# Patient Record
Sex: Male | Born: 1940
Health system: Southern US, Community
[De-identification: ages and names within clinical notes are randomized; demographics above are authoritative.]

## PROBLEM LIST (undated history)

## (undated) DIAGNOSIS — G709 Myoneural disorder, unspecified: Secondary | ICD-10-CM

## (undated) DIAGNOSIS — M1990 Unspecified osteoarthritis, unspecified site: Secondary | ICD-10-CM

## (undated) DIAGNOSIS — Z9889 Other specified postprocedural states: Secondary | ICD-10-CM

## (undated) DIAGNOSIS — Z992 Dependence on renal dialysis: Secondary | ICD-10-CM

## (undated) DIAGNOSIS — I10 Essential (primary) hypertension: Secondary | ICD-10-CM

## (undated) DIAGNOSIS — N186 End stage renal disease: Secondary | ICD-10-CM

## (undated) DIAGNOSIS — I509 Heart failure, unspecified: Secondary | ICD-10-CM

## (undated) DIAGNOSIS — G510 Bell's palsy: Secondary | ICD-10-CM

## (undated) DIAGNOSIS — R112 Nausea with vomiting, unspecified: Secondary | ICD-10-CM

## (undated) DIAGNOSIS — E119 Type 2 diabetes mellitus without complications: Secondary | ICD-10-CM

## (undated) DIAGNOSIS — J189 Pneumonia, unspecified organism: Secondary | ICD-10-CM

## (undated) DIAGNOSIS — N189 Chronic kidney disease, unspecified: Secondary | ICD-10-CM

## (undated) DIAGNOSIS — E7800 Pure hypercholesterolemia, unspecified: Secondary | ICD-10-CM

## (undated) DIAGNOSIS — I2510 Atherosclerotic heart disease of native coronary artery without angina pectoris: Secondary | ICD-10-CM

## (undated) DIAGNOSIS — I219 Acute myocardial infarction, unspecified: Secondary | ICD-10-CM

## (undated) HISTORY — PX: KNEE SURGERY: SHX244

## (undated) HISTORY — PX: BACK SURGERY: SHX140

## (undated) HISTORY — PX: CARDIAC CATHETERIZATION: SHX172

## (undated) HISTORY — PX: TONSILLECTOMY: SUR1361

## (undated) HISTORY — PX: CORONARY ANGIOPLASTY: SHX604

## (undated) HISTORY — PX: ROTATOR CUFF REPAIR: SHX139

---

## 1988-07-03 DIAGNOSIS — I219 Acute myocardial infarction, unspecified: Secondary | ICD-10-CM

## 1988-07-03 HISTORY — DX: Acute myocardial infarction, unspecified: I21.9

## 1998-03-01 ENCOUNTER — Ambulatory Visit: Admission: RE | Admit: 1998-03-01 | Discharge: 1998-03-01 | Payer: Self-pay | Admitting: Otolaryngology

## 2004-03-17 ENCOUNTER — Observation Stay (HOSPITAL_COMMUNITY): Admission: RE | Admit: 2004-03-17 | Discharge: 2004-03-18 | Payer: Self-pay | Admitting: Orthopedic Surgery

## 2005-08-09 ENCOUNTER — Encounter: Admission: RE | Admit: 2005-08-09 | Discharge: 2005-08-09 | Payer: Self-pay | Admitting: Family Medicine

## 2007-02-06 ENCOUNTER — Ambulatory Visit (HOSPITAL_COMMUNITY): Admission: RE | Admit: 2007-02-06 | Discharge: 2007-02-06 | Payer: Self-pay | Admitting: Orthopedic Surgery

## 2008-09-11 ENCOUNTER — Encounter: Admission: RE | Admit: 2008-09-11 | Discharge: 2008-09-11 | Payer: Self-pay | Admitting: Family Medicine

## 2010-01-06 ENCOUNTER — Inpatient Hospital Stay (HOSPITAL_COMMUNITY): Admission: RE | Admit: 2010-01-06 | Discharge: 2010-01-09 | Payer: Self-pay | Admitting: Orthopedic Surgery

## 2010-09-18 LAB — COMPREHENSIVE METABOLIC PANEL
AST: 23 U/L (ref 0–37)
CO2: 23 mEq/L (ref 19–32)
GFR calc non Af Amer: 43 mL/min — ABNORMAL LOW (ref >60)
Glucose, Bld: 126 mg/dL — ABNORMAL HIGH (ref 70–99)
Potassium: 4.2 mEq/L (ref 3.5–5.1)
Total Bilirubin: 1.2 mg/dL (ref 0.3–1.2)

## 2010-09-18 LAB — GLUCOSE, CAPILLARY
Glucose-Capillary: 119 mg/dL — ABNORMAL HIGH (ref 70–99)
Glucose-Capillary: 127 mg/dL — ABNORMAL HIGH (ref 70–99)
Glucose-Capillary: 141 mg/dL — ABNORMAL HIGH (ref 70–99)
Glucose-Capillary: 143 mg/dL — ABNORMAL HIGH (ref 70–99)
Glucose-Capillary: 143 mg/dL — ABNORMAL HIGH (ref 70–99)
Glucose-Capillary: 149 mg/dL — ABNORMAL HIGH (ref 70–99)
Glucose-Capillary: 157 mg/dL — ABNORMAL HIGH (ref 70–99)

## 2010-09-18 LAB — DIFFERENTIAL
Basophils Relative: 1 % (ref 0–1)
Eosinophils Absolute: 0.3 10*3/uL (ref 0.0–0.7)
Lymphocytes Relative: 19 % (ref 12–46)
Lymphs Abs: 1.6 10*3/uL (ref 0.7–4.0)
Neutro Abs: 5.8 10*3/uL (ref 1.7–7.7)

## 2010-09-18 LAB — BASIC METABOLIC PANEL
BUN: 26 mg/dL — ABNORMAL HIGH (ref 6–23)
BUN: 26 mg/dL — ABNORMAL HIGH (ref 6–23)
Calcium: 8.8 mg/dL (ref 8.4–10.5)
Calcium: 8.9 mg/dL (ref 8.4–10.5)
Chloride: 103 mEq/L (ref 96–112)
Chloride: 98 mEq/L (ref 96–112)
Creatinine, Ser: 1.79 mg/dL — ABNORMAL HIGH (ref 0.4–1.5)
GFR calc Af Amer: 46 mL/min — ABNORMAL LOW (ref >60)
GFR calc Af Amer: 53 mL/min — ABNORMAL LOW (ref >60)
GFR calc non Af Amer: 44 mL/min — ABNORMAL LOW (ref >60)
GFR calc non Af Amer: 46 mL/min — ABNORMAL LOW (ref >60)
Potassium: 4.8 mEq/L (ref 3.5–5.1)
Potassium: 4.8 mEq/L (ref 3.5–5.1)
Sodium: 130 mEq/L — ABNORMAL LOW (ref 135–145)
Sodium: 135 mEq/L (ref 135–145)

## 2010-09-18 LAB — CBC
HCT: 36.9 % — ABNORMAL LOW (ref 39.0–52.0)
MCH: 33.1 pg (ref 26.0–34.0)
MCHC: 34.9 g/dL (ref 30.0–36.0)

## 2010-09-18 LAB — HEMOGLOBIN AND HEMATOCRIT, BLOOD
HCT: 30 % — ABNORMAL LOW (ref 39.0–52.0)
HCT: 30.5 % — ABNORMAL LOW (ref 39.0–52.0)
Hemoglobin: 10.6 g/dL — ABNORMAL LOW (ref 13.0–17.0)
Hemoglobin: 10.7 g/dL — ABNORMAL LOW (ref 13.0–17.0)

## 2010-09-18 LAB — APTT: aPTT: 29 seconds (ref 24–37)

## 2010-09-18 LAB — URINALYSIS, ROUTINE W REFLEX MICROSCOPIC
Hgb urine dipstick: NEGATIVE
Nitrite: NEGATIVE
Specific Gravity, Urine: 1.016 (ref 1.005–1.030)

## 2010-09-18 LAB — ABO/RH: ABO/RH(D): A NEG

## 2010-09-18 LAB — TYPE AND SCREEN: ABO/RH(D): A NEG

## 2010-09-18 LAB — SURGICAL PCR SCREEN: Staphylococcus aureus: POSITIVE — AB

## 2010-11-15 NOTE — Op Note (Signed)
NAME:  Robert Lucas, Robert Lucas NO.:  [id number]   MEDICAL RECORD NO.:  [id number]          PATIENT TYPE:  AMB   LOCATION:  DAY                          FACILITY:  [facility]   PHYSICIAN:  Kipp Brood. Gioffre, M.D.DATE OF BIRTH:  1940/07/13   DATE OF PROCEDURE:  02/06/2007  DATE OF DISCHARGE:                               OPERATIVE REPORT   SURGEON:  Kipp Brood. Gladstone Lighter, M.D.   ASSISTANT:  Nurse.   PREOPERATIVE DIAGNOSES:  1. Torn medial meniscus left knee.  2. Plica in the suprapatellar pouch, left knee.  3. Chondrocalcinosis left knee.   POSTOPERATIVE DIAGNOSES:  1. Torn medial meniscus left knee.  2. Plica in the suprapatellar pouch, left knee.  3. Chondrocalcinosis left knee.   OPERATION:  1. Diagnostic arthroscopy left knee.  2. Excision of a medial plica in suprapatellar pouch, left knee.  3. Medial meniscectomy left knee.   PROCEDURE:  Under general anesthesia, routine orthopedic prep and  draping the left lower extremity carried out.  He had IV Ancef 1 gram IV  preop.  At this time a small punctate incision made in suprapatellar  pouch, inflow cannula was inserted, knee which was distended with  saline.  Another small punctate incision made in the anterolateral joint  and complete diagnostic arthroscopy carried out.  The patient had a  large medial plica and that was verified on the MRI.  I simply utilized  the Arthrocare and excised that.  In examining the suprapatellar pouch,  he had obvious findings of chondrocalcinosis. I went down into the  medial joint space.  He had obvious severe findings chondrocalcinosis.  The articular cartilage was gray in color.  There were multiple calcific  stipples.  The cartilage was stippled with calcification.  He had a torn  posterior horn medial meniscus.  I did medial meniscectomy.  The ACL and  PCL was intact, I went over the lateral joint.  He had same exact  findings of chondral calcification with calcific stippling.   Thoroughly  irrigated out the knee, cleaned as much of the material out as I could.  It was floating loosely and closed all three punctate incisions with 3-0  nylon suture.  I injected 30 mL 0.5%Marcaine and epinephrine in the knee  joint and a sterile Neosporin bundle dressing was applied.  Follow-up  care be on Percocet  10/650 for pain one every four hours p.r.n.  He will be on aspirin 325  mg b.i.d. today and b.i.d. for 2 weeks. He will be on crutches partial  to full weightbearing as tolerated.  We will see him in the office in 10  to 12 days for suture removal.           ______________________________  Kipp Brood. Gladstone Lighter, M.D.     RAG/MEDQ  D:  02/06/2007  T:  02/06/2007  Job:  [id number]

## 2010-11-18 NOTE — Op Note (Signed)
NAME:  MARKLEY, MUJICA NO.:  [id number]   MEDICAL RECORD NO.:  [id number]                   PATIENT TYPE:  AMB   LOCATION:  DAY                                  FACILITY:  [facility]   PHYSICIAN:  Kipp Brood. Gladstone Lighter, M.D.             DATE OF BIRTH:  [birth year]   DATE OF PROCEDURE:  03/17/2004  DATE OF DISCHARGE:                                 OPERATIVE REPORT   SURGEON:  Kipp Brood. Gladstone Lighter, M.D.   ASSISTANT:  Elby Showers. Paitsel, P.A.   PREOPERATIVE DIAGNOSES:  1.  Complete retracted tear of the rotator cuff tendon, left.  2.  Severe impingement syndrome, left shoulder.   POSTOPERATIVE DIAGNOSES:  1.  Complete retracted tear of the rotator cuff tendon, left.  2.  Severe impingement syndrome, left shoulder.   OPERATION:  1.  Partial acromionectomy and acromioplasty, left.  2.  Repair of a rotator cuff tendon on the left.  He had a complete tear      where the tendon was initially torn longitudinally and separated from      its insertion.  We did a primary repair, then reinforced it with a      Tissue Mend 4 x 4 tendon graft.   PROCEDURE:  Under general anesthesia, the patient first had an interscalene  block prior to bringing him back to the operating room.  Once back in the  operating room, we gave him general anesthesia, then routine orthopedic prep  and drape of the left shoulder was carried out.  Bleeders were identified  and cauterized.  At this time, the incision was carried down to the deltoid  muscle proximally.  I exposed the acromion by sharp dissection and then  identified a complete tear of the rotator cuff.  It was split  longitudinally.  It retracted medially and laterally.  It was noted at this  time that the long head of the biceps was intact.  Following this, I then  inserted a Bennett retractor and utilized the oscillating saw.  I did a  partial acromionectomy and then utilized the bur to even out the  undersurface of the acromion.  Once  this was done, we irrigated out the  area.  I burred down the lateral aspect of the humerus and then did a  primary repair by bringing the medial and lateral segment to the rotator  cuff together with one Ethibond suture.  Following this, I then reinforced a  repair site with a 4 x 4 double-thickness Tissue Mend tendon graft.  This  was necessary because the tendon was not in the best condition, it was  somewhat degenerated.  We had a nice repair.  No multi-tack sutures were  used.  We thoroughly irrigated out the area.  I inserted a piece of Gelfoam  in the subacromial space, reapproximated the deltoid tendon and the muscle  __________ fascia.  Subcu was closed with  0 Vicryl.  The skin with metal  staples.  A sterile Neosporin dressing was applied.  He was then placed in  the shoulder immobilizer.  He had 1 g of IV Ancef preop.                                               Ronald A. Gladstone Lighter, M.D.    RAG/MEDQ  D:  03/17/2004  T:  03/17/2004  Job:  [id number]

## 2011-04-17 LAB — URINALYSIS, ROUTINE W REFLEX MICROSCOPIC
Bilirubin Urine: NEGATIVE
Ketones, ur: NEGATIVE
Nitrite: NEGATIVE
Protein, ur: NEGATIVE
Urobilinogen, UA: 0.2

## 2011-04-17 LAB — PROTIME-INR: INR: 1

## 2011-04-17 LAB — BASIC METABOLIC PANEL
BUN: 23
Calcium: 9.8
Creatinine, Ser: 1.22
GFR calc non Af Amer: 59 — ABNORMAL LOW
Glucose, Bld: 115 — ABNORMAL HIGH

## 2012-09-14 DIAGNOSIS — G510 Bell's palsy: Secondary | ICD-10-CM

## 2012-09-14 HISTORY — DX: Bell's palsy: G51.0

## 2012-10-12 ENCOUNTER — Emergency Department (HOSPITAL_COMMUNITY): Payer: Medicare Other

## 2012-10-12 ENCOUNTER — Encounter (HOSPITAL_COMMUNITY): Payer: Self-pay | Admitting: *Deleted

## 2012-10-12 ENCOUNTER — Observation Stay (HOSPITAL_COMMUNITY)
Admission: EM | Admit: 2012-10-12 | Discharge: 2012-10-14 | Disposition: A | Discharge status: Home / Self Care | Payer: Medicare Other | Attending: Internal Medicine | Admitting: Internal Medicine

## 2012-10-12 DIAGNOSIS — M109 Gout, unspecified: Secondary | ICD-10-CM | POA: Insufficient documentation

## 2012-10-12 DIAGNOSIS — G510 Bell's palsy: Secondary | ICD-10-CM | POA: Insufficient documentation

## 2012-10-12 DIAGNOSIS — R079 Chest pain, unspecified: Principal | ICD-10-CM | POA: Insufficient documentation

## 2012-10-12 DIAGNOSIS — E785 Hyperlipidemia, unspecified: Secondary | ICD-10-CM | POA: Insufficient documentation

## 2012-10-12 DIAGNOSIS — E119 Type 2 diabetes mellitus without complications: Secondary | ICD-10-CM | POA: Insufficient documentation

## 2012-10-12 DIAGNOSIS — E860 Dehydration: Secondary | ICD-10-CM | POA: Insufficient documentation

## 2012-10-12 DIAGNOSIS — Z9861 Coronary angioplasty status: Secondary | ICD-10-CM | POA: Insufficient documentation

## 2012-10-12 DIAGNOSIS — E1169 Type 2 diabetes mellitus with other specified complication: Secondary | ICD-10-CM | POA: Diagnosis present

## 2012-10-12 DIAGNOSIS — I10 Essential (primary) hypertension: Secondary | ICD-10-CM | POA: Insufficient documentation

## 2012-10-12 DIAGNOSIS — R531 Weakness: Secondary | ICD-10-CM | POA: Diagnosis present

## 2012-10-12 DIAGNOSIS — Z79899 Other long term (current) drug therapy: Secondary | ICD-10-CM | POA: Insufficient documentation

## 2012-10-12 DIAGNOSIS — R5381 Other malaise: Secondary | ICD-10-CM | POA: Insufficient documentation

## 2012-10-12 DIAGNOSIS — I2510 Atherosclerotic heart disease of native coronary artery without angina pectoris: Secondary | ICD-10-CM | POA: Insufficient documentation

## 2012-10-12 HISTORY — DX: Pure hypercholesterolemia, unspecified: E78.00

## 2012-10-12 HISTORY — DX: Essential (primary) hypertension: I10

## 2012-10-12 HISTORY — DX: Type 2 diabetes mellitus without complications: E11.9

## 2012-10-12 HISTORY — DX: Acute myocardial infarction, unspecified: I21.9

## 2012-10-12 HISTORY — DX: Bell's palsy: G51.0

## 2012-10-12 LAB — COMPREHENSIVE METABOLIC PANEL
ALT: 16 U/L (ref 0–53)
CO2: 26 mEq/L (ref 19–32)
Calcium: 9 mg/dL (ref 8.4–10.5)
GFR calc Af Amer: 62 mL/min — ABNORMAL LOW (ref >90)
GFR calc non Af Amer: 53 mL/min — ABNORMAL LOW (ref >90)
Glucose, Bld: 179 mg/dL — ABNORMAL HIGH (ref 70–99)
Sodium: 137 mEq/L (ref 135–145)

## 2012-10-12 LAB — CBC
HCT: 35.3 % — ABNORMAL LOW (ref 39.0–52.0)
Hemoglobin: 13 g/dL (ref 13.0–17.0)
MCH: 32.3 pg (ref 26.0–34.0)
MCV: 87.8 fL (ref 78.0–100.0)
RBC: 4.02 MIL/uL — ABNORMAL LOW (ref 4.22–5.81)

## 2012-10-12 LAB — URINALYSIS, ROUTINE W REFLEX MICROSCOPIC
Bilirubin Urine: NEGATIVE
Glucose, UA: 100 mg/dL — AB
Hgb urine dipstick: NEGATIVE
Ketones, ur: NEGATIVE mg/dL
Leukocytes, UA: NEGATIVE
Nitrite: NEGATIVE
Protein, ur: >300 mg/dL — AB
Specific Gravity, Urine: 1.022 (ref 1.005–1.030)
Urobilinogen, UA: 1 mg/dL (ref 0.0–1.0)
pH: 6 (ref 5.0–8.0)

## 2012-10-12 LAB — TROPONIN I: Troponin I: <0.3 ng/mL (ref <0.30)

## 2012-10-12 MED ORDER — ASPIRIN 81 MG PO CHEW
324.0000 mg | CHEWABLE_TABLET | Freq: Once | ORAL | Status: AC
  Administered 2012-10-12: 324 mg via ORAL
  Filled 2012-10-12: qty 3
  Filled 2012-10-12: qty 1

## 2012-10-12 NOTE — ED Provider Notes (Signed)
History    72 year old male with generalized fatigue. Onset yesterday. Patient is also having intermittent left-sided chest pain last night. Pressure in L chest. Did not radiate. Episodes lasted minutes. No SOB. No fever or chils. No appreciable exacerbating or relieving factors. Continues to feel very fatigued. Says felt similar when had MI in 1990. Cardiology Dr Wynonia Lawman. Reports last stress test about 2y ago and thinks it was fine. Reports compliance with medications.   CSN: [id number]  Arrival date & time 10/12/12  2113   First MD Initiated Contact with Patient 10/12/12 2135      Chief Complaint  Patient presents with  . Hypertension  . Fatigue    (Consider location/radiation/quality/duration/timing/severity/associated sxs/prior treatment) HPI  Past Medical History  Diagnosis Date  . Myocardial infarction   . Diabetes   . Hypertension   . Hypercholesteremia   . Bell's palsy 09/14/2012    Past Surgical History  Procedure Laterality Date  . Knee surgery    . Back surgery    . Rotator cuff repair      No family history on file.  History  Substance Use Topics  . Smoking status: Former Smoker    Types: Cigars  . Smokeless tobacco: Never Used  . Alcohol Use: No      Review of Systems  All systems reviewed and negative, other than as noted in HPI.   Allergies  Penicillins  Home Medications  No current outpatient prescriptions on file.  BP 177/76  Pulse 63  Temp(Src) 97.9 F (36.6 C) (Oral)  Resp 20  SpO2 98%  Physical Exam  Nursing note and vitals reviewed. Constitutional: He appears well-developed and well-nourished. No distress.  HENT:  Head: Normocephalic and atraumatic.  Eyes: Conjunctivae are normal. Right eye exhibits no discharge. Left eye exhibits no discharge.  Neck: Neck supple.  Cardiovascular: Normal rate, regular rhythm and normal heart sounds.  Exam reveals no gallop and no friction rub.   No murmur heard. Pulmonary/Chest: Effort  normal and breath sounds normal. No respiratory distress.  Abdominal: Soft. He exhibits no distension. There is no tenderness.  Musculoskeletal: He exhibits no edema and no tenderness.  Mild pitting LE edema. Legs symmetric as compared to each other. No calf tenderness.   Neurological: He is alert.  Skin: Skin is warm and dry.  Psychiatric: He has a normal mood and affect. His behavior is normal. Thought content normal.    ED Course  Procedures (including critical care time)  Labs Reviewed  CBC - Abnormal; Notable for the following:    RBC 4.02 (*)    HCT 35.3 (*)    MCHC 36.8 (*)    All other components within normal limits  COMPREHENSIVE METABOLIC PANEL - Abnormal; Notable for the following:    Glucose, Bld 179 (*)    BUN 31 (*)    Albumin 3.4 (*)    GFR calc non Af Amer 53 (*)    GFR calc Af Amer 62 (*)    All other components within normal limits  URINALYSIS, ROUTINE W REFLEX MICROSCOPIC - Abnormal; Notable for the following:    Glucose, UA 100 (*)    Protein, ur >300 (*)    All other components within normal limits  TROPONIN I  URINE MICROSCOPIC-ADD ON   Dg Chest 2 View  10/12/2012  *RADIOLOGY REPORT*  Clinical Data: 72 year old male with high blood pressure.  Cough.  CHEST - 2 VIEW  Comparison: 01/04/2010 and earlier.  Findings: Mildly lower lung volumes.  Cardiac  size and mediastinal contours are within normal limits.  Visualized tracheal air column is within normal limits.  No pneumothorax, pulmonary edema, pleural effusion or confluent pulmonary opacity. No acute osseous abnormality identified.  IMPRESSION: No acute cardiopulmonary abnormality.   Original Report Authenticated By: Roselyn Reef, M.D.    EKG:  Rhythm: normal sinus Vent. rate 61 BPM PR interval 136 ms QRS duration 78 ms QT/QTc 436/439 ms ST segments: NS ST changes   1. Chest pain   2. Diabetes mellitus   3. Hyperlipidemia   4. Hypertension       MDM          Virgel Manifold, MD 10/14/12  [id number]

## 2012-10-12 NOTE — ED Notes (Signed)
Pt states last night he had some left chest pain and today has felt listless and fatigued.  Pt denies N/V.  Pt c/o indigestion.  Pt states he checked his BP at home because he was tired.  It was 151/88 and 157/88 and 178/85.  These pressures were taken @ 20:30 today.  Pt had an MI in 1990 and stated at that time he had chest pain radiating to his jaw with indigestion.

## 2012-10-12 NOTE — ED Notes (Signed)
Patient transported to CT 

## 2012-10-13 ENCOUNTER — Encounter (HOSPITAL_COMMUNITY): Payer: Self-pay | Admitting: Internal Medicine

## 2012-10-13 ENCOUNTER — Observation Stay (HOSPITAL_COMMUNITY): Payer: Medicare Other

## 2012-10-13 DIAGNOSIS — I10 Essential (primary) hypertension: Secondary | ICD-10-CM | POA: Diagnosis present

## 2012-10-13 DIAGNOSIS — M109 Gout, unspecified: Secondary | ICD-10-CM | POA: Diagnosis present

## 2012-10-13 DIAGNOSIS — R531 Weakness: Secondary | ICD-10-CM | POA: Diagnosis present

## 2012-10-13 DIAGNOSIS — E1169 Type 2 diabetes mellitus with other specified complication: Secondary | ICD-10-CM | POA: Diagnosis present

## 2012-10-13 DIAGNOSIS — E119 Type 2 diabetes mellitus without complications: Secondary | ICD-10-CM | POA: Diagnosis present

## 2012-10-13 DIAGNOSIS — R079 Chest pain, unspecified: Secondary | ICD-10-CM | POA: Diagnosis present

## 2012-10-13 DIAGNOSIS — E785 Hyperlipidemia, unspecified: Secondary | ICD-10-CM | POA: Diagnosis present

## 2012-10-13 DIAGNOSIS — G510 Bell's palsy: Secondary | ICD-10-CM | POA: Diagnosis present

## 2012-10-13 LAB — BASIC METABOLIC PANEL
CO2: 24 mEq/L (ref 19–32)
Chloride: 102 mEq/L (ref 96–112)
GFR calc non Af Amer: 59 mL/min — ABNORMAL LOW (ref >90)
Glucose, Bld: 132 mg/dL — ABNORMAL HIGH (ref 70–99)
Potassium: 3.8 mEq/L (ref 3.5–5.1)
Sodium: 136 mEq/L (ref 135–145)

## 2012-10-13 LAB — GLUCOSE, CAPILLARY
Glucose-Capillary: 134 mg/dL — ABNORMAL HIGH (ref 70–99)
Glucose-Capillary: 140 mg/dL — ABNORMAL HIGH (ref 70–99)

## 2012-10-13 LAB — CBC
Hemoglobin: 13.2 g/dL (ref 13.0–17.0)
MCH: 32.8 pg (ref 26.0–34.0)
RBC: 4.03 MIL/uL — ABNORMAL LOW (ref 4.22–5.81)

## 2012-10-13 LAB — HEMOGLOBIN A1C: Hgb A1c MFr Bld: 6.8 % — ABNORMAL HIGH (ref <5.7)

## 2012-10-13 LAB — URINE MICROSCOPIC-ADD ON: Urine-Other: NONE SEEN

## 2012-10-13 MED ORDER — SODIUM CHLORIDE 0.9 % IJ SOLN
3.0000 mL | Freq: Two times a day (BID) | INTRAMUSCULAR | Status: DC
  Administered 2012-10-13 (×2): 3 mL via INTRAVENOUS

## 2012-10-13 MED ORDER — FINASTERIDE 5 MG PO TABS
5.0000 mg | ORAL_TABLET | Freq: Every day | ORAL | Status: DC
  Administered 2012-10-13 – 2012-10-14 (×2): 5 mg via ORAL
  Filled 2012-10-13 (×2): qty 1

## 2012-10-13 MED ORDER — ACETAMINOPHEN 650 MG RE SUPP: 650.0000 mg | Freq: Four times a day (QID) | RECTAL | Status: DC | PRN

## 2012-10-13 MED ORDER — GLIPIZIDE 5 MG PO TABS
5.0000 mg | ORAL_TABLET | Freq: Every day | ORAL | Status: DC
Start: 2012-10-13 — End: 2012-10-14
  Administered 2012-10-13 – 2012-10-14 (×2): 5 mg via ORAL
  Filled 2012-10-13 (×3): qty 1

## 2012-10-13 MED ORDER — ENOXAPARIN SODIUM 40 MG/0.4ML ~~LOC~~ SOLN
40.0000 mg | SUBCUTANEOUS | Status: DC
  Administered 2012-10-13: 40 mg via SUBCUTANEOUS
  Filled 2012-10-13 (×2): qty 0.4

## 2012-10-13 MED ORDER — TECHNETIUM TO 99M ALBUMIN AGGREGATED
6.0000 | Freq: Once | INTRAVENOUS | Status: AC | PRN
  Administered 2012-10-13: 6 via INTRAVENOUS

## 2012-10-13 MED ORDER — ATORVASTATIN CALCIUM 40 MG PO TABS
40.0000 mg | ORAL_TABLET | Freq: Every day | ORAL | Status: DC
  Administered 2012-10-13: 40 mg via ORAL
  Filled 2012-10-13 (×2): qty 1

## 2012-10-13 MED ORDER — VITAMIN D (ERGOCALCIFEROL) 1.25 MG (50000 UNIT) PO CAPS: 50000.0000 [IU] | ORAL_CAPSULE | ORAL | Status: DC

## 2012-10-13 MED ORDER — NIACIN ER (ANTIHYPERLIPIDEMIC) 500 MG PO TBCR
1000.0000 mg | EXTENDED_RELEASE_TABLET | Freq: Every evening | ORAL | Status: DC
  Administered 2012-10-13: 1000 mg via ORAL
  Filled 2012-10-13 (×3): qty 2

## 2012-10-13 MED ORDER — ACETAMINOPHEN 325 MG PO TABS: 650.0000 mg | ORAL_TABLET | Freq: Four times a day (QID) | ORAL | Status: DC | PRN

## 2012-10-13 MED ORDER — DILTIAZEM HCL ER 240 MG PO CP24
240.0000 mg | ORAL_CAPSULE | Freq: Every day | ORAL | Status: DC
  Administered 2012-10-13 – 2012-10-14 (×2): 240 mg via ORAL
  Filled 2012-10-13 (×2): qty 1

## 2012-10-13 MED ORDER — LOSARTAN POTASSIUM 50 MG PO TABS
100.0000 mg | ORAL_TABLET | Freq: Every day | ORAL | Status: DC
  Administered 2012-10-13 – 2012-10-14 (×2): 100 mg via ORAL
  Filled 2012-10-13 (×2): qty 2

## 2012-10-13 MED ORDER — ASPIRIN EC 325 MG PO TBEC
325.0000 mg | DELAYED_RELEASE_TABLET | Freq: Every day | ORAL | Status: DC
  Administered 2012-10-13 – 2012-10-14 (×2): 325 mg via ORAL
  Filled 2012-10-13 (×2): qty 1

## 2012-10-13 MED ORDER — NITROGLYCERIN 0.4 MG SL SUBL: 0.4000 mg | SUBLINGUAL_TABLET | SUBLINGUAL | Status: DC | PRN

## 2012-10-13 MED ORDER — INSULIN ASPART 100 UNIT/ML ~~LOC~~ SOLN
0.0000 [IU] | Freq: Three times a day (TID) | SUBCUTANEOUS | Status: DC
  Administered 2012-10-13: 2 [IU] via SUBCUTANEOUS
  Administered 2012-10-13: 1 [IU] via SUBCUTANEOUS

## 2012-10-13 MED ORDER — TERAZOSIN HCL 5 MG PO CAPS
5.0000 mg | ORAL_CAPSULE | Freq: Every day | ORAL | Status: DC
  Administered 2012-10-13 (×2): 5 mg via ORAL
  Filled 2012-10-13 (×3): qty 1

## 2012-10-13 MED ORDER — SODIUM CHLORIDE 0.9 % IV SOLN
INTRAVENOUS | Status: AC
  Administered 2012-10-13: 02:00:00 via INTRAVENOUS

## 2012-10-13 MED ORDER — TECHNETIUM TC 99M DIETHYLENETRIAME-PENTAACETIC ACID: 40.0000 | Freq: Once | INTRAVENOUS | Status: AC | PRN

## 2012-10-13 MED ORDER — ONDANSETRON HCL 4 MG/2ML IJ SOLN: 4.0000 mg | Freq: Four times a day (QID) | INTRAMUSCULAR | Status: DC | PRN

## 2012-10-13 MED ORDER — ALLOPURINOL 100 MG PO TABS
100.0000 mg | ORAL_TABLET | Freq: Two times a day (BID) | ORAL | Status: DC
  Administered 2012-10-13 – 2012-10-14 (×3): 100 mg via ORAL
  Filled 2012-10-13 (×4): qty 1

## 2012-10-13 MED ORDER — ONDANSETRON HCL 4 MG PO TABS: 4.0000 mg | ORAL_TABLET | Freq: Four times a day (QID) | ORAL | Status: DC | PRN

## 2012-10-13 NOTE — Progress Notes (Signed)
[facility] PROGRESS NOTE  ADOM CABADAS [id number] DOB: 11/16/1940 DOA: 10/12/2012 PCP: No primary provider on file.  Brief Narrative: 72 y.o. male who has had a recent right-sided Bell's palsy 6 weeks ago with history of CAD status post angioplasty in 1990, diabetes mellitus type 2 presented to the ER because of weakness and elevated blood pressure. Patient states that last night he was experiencing multiple episodes of chest pain which was on the left side stabbing and sometimes pressure-like lasting for 5-10 minutes waking him up from sleep. Denies any associated shortness of breath nausea vomiting diaphoresis palpitations or dizziness. Yesterday since morning he has been experiencing weakness which increased after he mowed. He checked his blood pressure later and was found to be high with systolic around [id number] and at this point decided to come to the ER. In the ER EKG chest x-ray cardiac markers were negative and he was chest pain-free at this time has been admitted for further management. Patient denies any nausea vomiting focal deficits blurred vision difficulty swallowing or speaking. 6 weeks ago he was treated with prednisone and antiviral for his Bell's palsy.  Assessment/Plan: Chest pain with history of CAD - cycle cardiac markers, first 2 troponins are negative  - d-dimer was positive, patient to undergo a VQ scan of DVT ultrasound today. Doesn't have much risk factors for getting a PE. - If workup for PE is negative, it is not unreasonable to call cardiology for stress test as he has known heart disease.   Weakness - probably from dehydration. Gently hydrate. - TSH pending.   Hypertension - continue home medications. When necessary IV hydralazine for systolic blood pressure more than 160. Recent Bell's palsy.  Diabetes mellitus type 2 - continue home medications with sliding-scale coverage.  Hyperlipidemia - continue home medications.  Code Status: Full Family  Communication: Wife at bedside  Disposition Plan: Home once the workup is finished  Consultants:  None  Procedures:  None  Antibiotics:  None  HPI/Subjective: Feels well this morning, denies any chest pain or breathing difficulties.  Objective: Filed Vitals:   10/12/12 2323 10/13/12 0045 10/13/12 0120 10/13/12 0548  BP: 160/74 154/78 157/73 158/80  Pulse: 60 57 60 68  Temp:  98.4 F (36.9 C) 98.2 F (36.8 C) 97.8 F (36.6 C)  TempSrc:  Oral Oral Oral  Resp: 21 19 18 16   Height:   5\' 4"  (1.626 m)   Weight:   82.6 kg (182 lb 1.6 oz)   SpO2: 97% 97% 99% 98%    Intake/Output Summary (Last 24 hours) at 10/13/12 1124 Last data filed at 10/13/12 0758  Gross per 24 hour  Intake 306.25 ml  Output   1150 ml  Net -843.75 ml   Filed Weights   10/13/12 0120  Weight: 82.6 kg (182 lb 1.6 oz)    Exam:   General:  NAD  Cardiovascular: regular rate and rhythm, without MRG  Respiratory: good air movement, clear to auscultation throughout, no wheezing, ronchi or rales  Abdomen: soft, not tender to palpation, positive bowel sounds  MSK: no peripheral edema  Neuro: CN 2-12 grossly intact, MS 5/5 in all 4  Data Reviewed: Basic Metabolic Panel:  Recent Labs Lab 10/12/12 2215 10/13/12 0155  NA 137 136  K 3.7 3.8  CL 102 102  CO2 26 24  GLUCOSE 179* 132*  BUN 31* 31*  CREATININE 1.29 1.18  CALCIUM 9.0 8.9   Liver Function Tests:  Recent Labs Lab 10/12/12 2215  AST  22  ALT 16  ALKPHOS 62  BILITOT 0.7  PROT 6.5  ALBUMIN 3.4*   CBC:  Recent Labs Lab 10/12/12 2215 10/13/12 0155  WBC 6.4 6.7  HGB 13.0 13.2  HCT 35.3* 35.5*  MCV 87.8 88.1  PLT 160 156   Cardiac Enzymes:  Recent Labs Lab 10/12/12 2215 10/13/12 0156 10/13/12 0756  TROPONINI <0.30 <0.30 <0.30   CBG:  Recent Labs Lab 10/13/12 0118 10/13/12 0808  GLUCAP 134* 140*    Studies: Dg Chest 2 View  10/12/2012  *RADIOLOGY REPORT*  Clinical Data: 72 year old male with high  blood pressure.  Cough.  CHEST - 2 VIEW  Comparison: 01/04/2010 and earlier.  Findings: Mildly lower lung volumes.  Cardiac size and mediastinal contours are within normal limits.  Visualized tracheal air column is within normal limits.  No pneumothorax, pulmonary edema, pleural effusion or confluent pulmonary opacity. No acute osseous abnormality identified.  IMPRESSION: No acute cardiopulmonary abnormality.   Original Report Authenticated By: Roselyn Reef, M.D.    Nm Pulmonary Perf And Vent  10/13/2012  *RADIOLOGY REPORT*  Clinical Data:  Chest pain.  Elevated D-dimer.  Renal insufficiency during this admission precludes IV contrast administration.  NUCLEAR MEDICINE VENTILATION - PERFUSION LUNG SCAN  Technique:  Ventilation images were obtained in multiple projections using inhaled aerosol technetium 73m DTPA.  Perfusion images were obtained in multiple projections after intravenous injection of Tc-2m MAA.  Radiopharmaceuticals:  40.94mCi Tc-33m DTPA aerosol and 6.0 mCi Tc- 58m MAA.  Comparison: No prior nuclear imaging.  Two-view chest x-ray yesterday.  Findings:  Ventilation:  Clumping of the aerosol in the main stem bronchi bilaterally.  No ventilatory defect.  Perfusion:  No segmental or subsegmental perfusion defects in either lung.  IMPRESSION: Normal pulmonary perfusion without evidence of pulmonary embolism. No ventilatory defects, with clumping of the aerosol in the central airways.   Original Report Authenticated By: Evangeline Dakin, M.D.     Scheduled Meds: . allopurinol  100 mg Oral BID  . aspirin EC  325 mg Oral Daily  . atorvastatin  40 mg Oral q1800  . diltiazem  240 mg Oral Daily  . enoxaparin (LOVENOX) injection  40 mg Subcutaneous Q24H  . finasteride  5 mg Oral Daily  . glipiZIDE  5 mg Oral QAC breakfast  . insulin aspart  0-9 Units Subcutaneous TID WC  . losartan  100 mg Oral Daily  . niacin  1,000 mg Oral QPM  . sodium chloride  3 mL Intravenous Q12H  . terazosin  5 mg Oral QHS   . [START ON 10/17/2012] Vitamin D (Ergocalciferol)  50,000 Units Oral Q7 days   Continuous Infusions: . sodium chloride 75 mL/hr at 10/13/12 [id number]    Principal Problem:   Chest pain Active Problems:   Diabetes mellitus   Hypertension   Hyperlipidemia   Gout   Bell's palsy   Weakness  Time spent: [place], MD [facility] Pager [phone]. If 7 PM - 7 AM, please contact night-coverage at [email or web address], password [facility] 10/13/2012, 11:24 AM  LOS: 1 day

## 2012-10-13 NOTE — H&P (Signed)
[facility] History and Physical  Robert Lucas [id number] DOB: 09-27-1940 DOA: 10/12/2012  Referring physician: Dr.Kohut. PCP: No primary provider on file.  Specialists: [place] [facility].  Chief Complaint: Weakness and chest pain.  HPI: Robert Lucas is a 72 y.o. male who has had a recent right-sided Bell's palsy 6 weeks ago with history of CAD status post angioplasty in 1990, diabetes mellitus type 2 presented to the ER because of weakness and elevated blood pressure. Patient states that last night he was experiencing multiple episodes of chest pain which was on the left side stabbing and sometimes pressure-like lasting for 5-10 minutes waking him up from sleep. Denies any associated shortness of breath nausea vomiting diaphoresis palpitations or dizziness. Yesterday since morning he has been experiencing weakness which increased after he mowed. He checked his blood pressure later and was found to be high with systolic around [id number] and at this point decided to come to the ER. In the ER EKG chest x-ray cardiac markers were negative and he was chest pain-free at this time has been admitted for further management. Patient denies any nausea vomiting focal deficits blurred vision difficulty swallowing or speaking. 6 weeks ago he was treated with prednisone and antiviral for his Bell's palsy.  Review of Systems: As presented in the history of presenting illness, rest negative.  Past Medical History  Diagnosis Date  . Myocardial infarction   . Diabetes   . Hypertension   . Hypercholesteremia   . Bell's palsy 09/14/2012   Past Surgical History  Procedure Laterality Date  . Knee surgery    . Back surgery    . Rotator cuff repair    . Cardiac catheterization     Social History:  reports that he has quit smoking. His smoking use included Cigars. He has never used smokeless tobacco. He reports that he does not drink alcohol or use illicit drugs. Lives at home with his wife.  where does patient live-- Can do ADLs. Can patient participate in ADLs?  Allergies  Allergen Reactions  . Penicillins Rash    Rash around ankles    Family History  Problem Relation Age of Onset  . Dementia Mother   . Stroke Brother       Prior to Admission medications   Medication Sig Start Date End Date Taking? Authorizing Provider  allopurinol (ZYLOPRIM) 100 MG tablet Take 100 mg by mouth 2 (two) times daily.    Yes Historical Provider, MD  aspirin EC 81 MG tablet Take 81 mg by mouth daily.   Yes Historical Provider, MD  diltiazem (DILACOR XR) 240 MG 24 hr capsule Take 240 mg by mouth daily.   Yes Historical Provider, MD  finasteride (PROSCAR) 5 MG tablet Take 5 mg by mouth daily.   Yes Historical Provider, MD  glipiZIDE (GLUCOTROL) 5 MG tablet Take 5 mg by mouth daily.   Yes Historical Provider, MD  indomethacin (INDOCIN) 25 MG capsule Take 25 mg by mouth daily as needed. For back pain   Yes Historical Provider, MD  losartan (COZAAR) 100 MG tablet Take 100 mg by mouth daily.   Yes Historical Provider, MD  niacin (NIASPAN) 1000 MG CR tablet Take 1,000 mg by mouth every evening.   Yes Historical Provider, MD  simvastatin (ZOCOR) 80 MG tablet Take 80 mg by mouth at bedtime.   Yes Historical Provider, MD  terazosin (HYTRIN) 5 MG capsule Take 5 mg by mouth at bedtime.   Yes Historical Provider, MD  Vitamin D, Ergocalciferol, (  DRISDOL) 50000 UNITS CAPS Take 50,000 Units by mouth every 7 (seven) days. Thursday   Yes Historical Provider, MD   Physical Exam: Filed Vitals:   10/12/12 2119 10/12/12 2133 10/12/12 2323  BP: 179/89 177/76 160/74  Pulse: 63  60  Temp: 97.9 F (36.6 C)    TempSrc: Oral    Resp: 20  21  SpO2: 98%  97%     General:  Well-developed and nourished.  Eyes: Anicteric no pallor.  ENT: Right sided facial palsy. No discharge from the ears eyes nose or mouth.  Neck: No mass felt.  Cardiovascular: S1-S2 heard.  Respiratory: No rhonchi or  crepitations.  Abdomen: Soft nontender bowel sounds present.  Skin: No rash.  Musculoskeletal: No edema.  Psychiatric: Appears normal.  Neurologic: Right-sided facial palsy. Alert awake oriented to time place and person. Moves all extremities.  Labs on Admission:  Basic Metabolic Panel:  Recent Labs Lab 10/12/12 2215  NA 137  K 3.7  CL 102  CO2 26  GLUCOSE 179*  BUN 31*  CREATININE 1.29  CALCIUM 9.0   Liver Function Tests:  Recent Labs Lab 10/12/12 2215  AST 22  ALT 16  ALKPHOS 62  BILITOT 0.7  PROT 6.5  ALBUMIN 3.4*   No results found for this basename: LIPASE, AMYLASE,  in the last 168 hours No results found for this basename: AMMONIA,  in the last 168 hours CBC:  Recent Labs Lab 10/12/12 2215  WBC 6.4  HGB 13.0  HCT 35.3*  MCV 87.8  PLT 160   Cardiac Enzymes:  Recent Labs Lab 10/12/12 2215  TROPONINI <0.30    BNP (last 3 results) No results found for this basename: PROBNP,  in the last 8760 hours CBG: No results found for this basename: GLUCAP,  in the last 168 hours  Radiological Exams on Admission: Dg Chest 2 View  10/12/2012  *RADIOLOGY REPORT*  Clinical Data: 72 year old male with high blood pressure.  Cough.  CHEST - 2 VIEW  Comparison: 01/04/2010 and earlier.  Findings: Mildly lower lung volumes.  Cardiac size and mediastinal contours are within normal limits.  Visualized tracheal air column is within normal limits.  No pneumothorax, pulmonary edema, pleural effusion or confluent pulmonary opacity. No acute osseous abnormality identified.  IMPRESSION: No acute cardiopulmonary abnormality.   Original Report Authenticated By: Roselyn Reef, M.D.     EKG: Independently reviewed. Normal sinus rhythm.  Assessment/Plan Principal Problem:   Chest pain Active Problems:   Diabetes mellitus   Hypertension   Hyperlipidemia   Gout   Bell's palsy   Weakness   1. Chest pain with history of CAD - cycle cardiac markers. Check d-dimer.  Aspirin. When necessary nitroglycerin. Patient is presently chest pain-free. 2. Weakness - probably from dehydration. Gently hydrate. Check orthostatics in a.m. Check TSH. Check cardiac markers. 3. Hypertension - continue home medications. When necessary IV hydralazine for systolic blood pressure more than 160. 4. Recent Bell's palsy. 5. Diabetes mellitus type 2 - continue home medications with sliding-scale coverage. 6. Hyperlipidemia - continue home medications.    Code Status: Full code.  Family Communication: Patient's wife at the bedside.  Disposition Plan: Admit for observation.    Rosalynd Mcwright N. [facility] Pager [phone].  If 7PM-7AM, please contact night-coverage [email or web address] Password [facility] 10/13/2012, 12:18 AM

## 2012-10-13 NOTE — Progress Notes (Signed)
VASCULAR LAB PRELIMINARY  PRELIMINARY  PRELIMINARY  PRELIMINARY  Bilateral lower extremity venous Dopplers completed.    Preliminary report:  There is no DVT or SVT noted in the bilateral lower extremities.  Haniyah Maciolek, RVT 10/13/2012, 9:25 AM

## 2012-10-14 DIAGNOSIS — R5381 Other malaise: Secondary | ICD-10-CM

## 2012-10-14 LAB — GLUCOSE, CAPILLARY: Glucose-Capillary: 134 mg/dL — ABNORMAL HIGH (ref 70–99)

## 2012-10-14 NOTE — Consult Note (Signed)
Cardiology Consult Note  Admit date: 10/12/2012 Name: Robert Lucas 72 y.o.  male DOB:  08/30/1940 MRN:  [id number]  Today's date:  10/14/2012  Referring Physician:    [facility]  Primary Physician:  Dr. Jonathon Jordan  Reason for Consultation:    Chest pain and weakness  IMPRESSIONS: 1. Left-sided chest pain with some atypical features in a patient with known coronary artery disease 2. Weakness and elevated blood pressure that has resolved 3. Coronary artery disease with previous anterior infarction previous intervention of the LAD 4. Hypertension 5. Hyperlipidemia 6. Diabetes mellitus non-insulin-dependent  RECOMMENDATION: The patient has negative enzymes and a nondiagnostic EKG. His symptoms were somewhat atypical and I think he can go home and have a stress Cardiolite done in the office later this week that we will schedule.  HISTORY: This 72 year old male has a history of coronary artery disease with a previous anterior infarction in 1990 he also has diabetes and hypertension. He had Bell's palsy about 6 weeks ago treated with prednisone and antiviral therapy. He is slowly recovering from this but has residual paresis. He had gone to [facility] and had some vague left-sided chest aching last Thursday night. Yesterday afternoon he was lethargic and felt very weak but did not have any anterior chest discomfort. The weakness reminded him of his infarction in 1990 and  he took his blood pressure that he found to be elevated and went to the [facility]. This was closed he came to the emergency room. A d-dimer was found to be elevated and he was admitted. He had a ventilation perfusion lung scan that showed no evidence of pulmonary embolus. His blood pressure is better and he is feeling better at this time. When he had his previous infarction in 1990, his symptoms were entirely different than they were yesterday. He normally is able to exercise and has no PND, orthopnea,  60, or claudication.  Past Medical History  Diagnosis Date  . Myocardial infarction   . Diabetes   . Hypertension   . Hypercholesteremia   . Bell's palsy 09/14/2012      Past Surgical History  Procedure Laterality Date  . Knee surgery    . Back surgery    . Rotator cuff repair    . Cardiac catheterization       Allergies:  is allergic to penicillins.   Medications: Prior to Admission medications   Medication Sig Start Date End Date Taking? Authorizing Provider  allopurinol (ZYLOPRIM) 100 MG tablet Take 100 mg by mouth 2 (two) times daily.    Yes Historical Provider, MD  aspirin EC 81 MG tablet Take 81 mg by mouth daily.   Yes Historical Provider, MD  diltiazem (DILACOR XR) 240 MG 24 hr capsule Take 240 mg by mouth daily.   Yes Historical Provider, MD  finasteride (PROSCAR) 5 MG tablet Take 5 mg by mouth daily.   Yes Historical Provider, MD  glipiZIDE (GLUCOTROL) 5 MG tablet Take 5 mg by mouth daily.   Yes Historical Provider, MD  indomethacin (INDOCIN) 25 MG capsule Take 25 mg by mouth daily as needed. For back pain   Yes Historical Provider, MD  losartan (COZAAR) 100 MG tablet Take 100 mg by mouth daily.   Yes Historical Provider, MD  niacin (NIASPAN) 1000 MG CR tablet Take 1,000 mg by mouth every evening.   Yes Historical Provider, MD  simvastatin (ZOCOR) 80 MG tablet Take 80 mg by mouth at bedtime.   Yes Historical Provider, MD  terazosin (HYTRIN) 5 MG capsule Take 5 mg by mouth at bedtime.   Yes Historical Provider, MD  Vitamin D, Ergocalciferol, (DRISDOL) 50000 UNITS CAPS Take 50,000 Units by mouth every 7 (seven) days. Thursday   Yes Historical Provider, MD    Family History: No family status information on file.    Social History:   reports that he has quit smoking. His smoking use included Cigars. He has never used smokeless tobacco. He reports that he does not drink alcohol or use illicit drugs.   History   Social History Narrative  . No narrative on file     Review of Systems: Other than as noted above the remainder of the review of systems is unremarkable.  Physical Exam: BP 137/70  Pulse 63  Temp(Src) 97.6 F (36.4 C) (Oral)  Resp 20  Ht 5\' 4"  (1.626 m)  Wt 82.6 kg (182 lb 1.6 oz)  BMI 31.24 kg/m2  SpO2 98%  General appearance: alert, cooperative and mildly obese Head: Normocephalic, without obvious abnormality, atraumatic, Balding male hair pattern Eyes: conjunctivae/corneas clear. PERRL, EOM's intact. Fundi not examined  Neck: no adenopathy, no carotid bruit, no JVD and supple, symmetrical, trachea midline Lungs: clear to auscultation bilaterally Heart: regular rate and rhythm, S1, S2 normal, no murmur, click, rub or gallop Abdomen: soft, non-tender; bowel sounds normal; no masses,  no organomegaly Rectal: deferred Extremities: extremities normal, atraumatic, no cyanosis or edema Pulses: 2+ and symmetric Skin: Skin color, texture, turgor normal. No rashes or lesions Neurologic: Alert and oriented X 3, normal strength and tone. Normal symmetric reflexes. Normal coordination and gait  Labs: CBC  Recent Labs  10/13/12 0155  WBC 6.7  RBC 4.03*  HGB 13.2  HCT 35.5*  PLT 156  MCV 88.1  MCH 32.8  MCHC 37.2*  RDW 13.6   CMP   Recent Labs  10/12/12 2215 10/13/12 0155  NA 137 136  K 3.7 3.8  CL 102 102  CO2 26 24  GLUCOSE 179* 132*  BUN 31* 31*  CREATININE 1.29 1.18  CALCIUM 9.0 8.9  PROT 6.5  --   ALBUMIN 3.4*  --   AST 22  --   ALT 16  --   ALKPHOS 62  --   BILITOT 0.7  --   GFRNONAA 53* 59*  GFRAA 62* 69*   Cardiac Panel (last 3 results)  Recent Labs  10/13/12 0156 10/13/12 0756 10/13/12 1314  TROPONINI <0.30 <0.30 <0.30     Radiology: No acute cardiopulmonary abnormality  EKG: Nonspecific ST changes, old anterior infarction  Signed:  W. Doristine Church MD [facility]   Cardiology Consultant  10/14/2012, 10:04 AM

## 2012-10-15 NOTE — Discharge Summary (Signed)
Physician Discharge Summary  Robert Lucas [id number] DOB: 20-Aug-1940 DOA: 10/12/2012  PCP: No primary provider on file.  Admit date: 10/12/2012 Discharge date: 10/15/2012  Time spent: 40 minutes  Recommendations for Outpatient Follow-up:  1. Followup he'll cardiologist for stress test later this week.  Discharge Diagnoses:  Principal Problem:   Chest pain Active Problems:   Diabetes mellitus   Hypertension   Hyperlipidemia   Gout   Bell's palsy   Weakness  Discharge Condition: stable  Diet recommendation: heart healthy  Filed Weights   10/13/12 0120  Weight: 82.6 kg (182 lb 1.6 oz)   History of present illness:  Robert Lucas is a 72 y.o. male who has had a recent right-sided Bell's palsy 6 weeks ago with history of CAD status post angioplasty in 1990, diabetes mellitus type 2 presented to the ER because of weakness and elevated blood pressure. Patient states that last night he was experiencing multiple episodes of chest pain which was on the left side stabbing and sometimes pressure-like lasting for 5-10 minutes waking him up from sleep. Denies any associated shortness of breath nausea vomiting diaphoresis palpitations or dizziness. Yesterday since morning he has been experiencing weakness which increased after he mowed. He checked his blood pressure later and was found to be high with systolic around [id number] and at this point decided to come to the ER. In the ER EKG chest x-ray cardiac markers were negative and he was chest pain-free at this time has been admitted for further management. Patient denies any nausea vomiting   Hospital Course:  Chest pain with history of CAD - cardiac markers negative. D-dimer was positive, therefore patient underwent a VQ scan which was negative and a DVT ultrasound which was as well negative. Given findings, I have asked Dr. Wynonia Lawman to evaluate for a stress test, which patient is to have later this week as an outpatient.  Weakness - probably  from dehydration, improved with hydration.  Hypertension - continue home medications. It was well controlled.  Recent Bell's palsy - stable Diabetes mellitus type 2 - continued home medications with sliding-scale coverage.  Hyperlipidemia - continued home medications.  Procedures:  Venous doppler Summary: - No evidence of deep vein or superficial thrombosis involving the right lower extremity and left lower extremity. - No evidence of Baker's cyst on the right or left.    NM scan Normal pulmonary perfusion without evidence of pulmonary embolism.  No ventilatory defects, with clumping of the aerosol in the central  airways.  Consultations:  Cardiology (Dr. Wynonia Lawman)  Discharge Exam: Filed Vitals:   10/13/12 1315 10/13/12 1403 10/13/12 2220 10/14/12 0659  BP: 166/66 151/82 149/69 137/70  Pulse: 71 69 64 63  Temp: 97.6 F (36.4 C) 97.6 F (36.4 C) 98.4 F (36.9 C) 97.6 F (36.4 C)  TempSrc:  Oral Oral   Resp:  16 18 20   Height:      Weight:      SpO2:  97% 99% 98%   General: NAD Cardiovascular: RRR Respiratory: CTA biL    Medication List    TAKE these medications       allopurinol 100 MG tablet  Commonly known as:  ZYLOPRIM  Take 100 mg by mouth 2 (two) times daily.     aspirin EC 81 MG tablet  Take 81 mg by mouth daily.     diltiazem 240 MG 24 hr capsule  Commonly known as:  DILACOR XR  Take 240 mg by mouth daily.  finasteride 5 MG tablet  Commonly known as:  PROSCAR  Take 5 mg by mouth daily.     glipiZIDE 5 MG tablet  Commonly known as:  GLUCOTROL  Take 5 mg by mouth daily.     indomethacin 25 MG capsule  Commonly known as:  INDOCIN  Take 25 mg by mouth daily as needed. For back pain     losartan 100 MG tablet  Commonly known as:  COZAAR  Take 100 mg by mouth daily.     niacin 1000 MG CR tablet  Commonly known as:  NIASPAN  Take 1,000 mg by mouth every evening.     simvastatin 80 MG tablet  Commonly known as:  ZOCOR  Take 80 mg by  mouth at bedtime.     terazosin 5 MG capsule  Commonly known as:  HYTRIN  Take 5 mg by mouth at bedtime.     Vitamin D (Ergocalciferol) 50000 UNITS Caps  Commonly known as:  DRISDOL  Take 50,000 Units by mouth every 7 (seven) days. Thursday           Follow-up Information   Follow up with [facility] On 10/17/2012. (at 9:15 for [place])    Contact information:   [address] [place] [address] [phone]      The results of significant diagnostics from this hospitalization (including imaging, microbiology, ancillary and laboratory) are listed below for reference.    Significant Diagnostic Studies: Dg Chest 2 View  10/12/2012  *RADIOLOGY REPORT*  Clinical Data: 72 year old male with high blood pressure.  Cough.  CHEST - 2 VIEW  Comparison: 01/04/2010 and earlier.  Findings: Mildly lower lung volumes.  Cardiac size and mediastinal contours are within normal limits.  Visualized tracheal air column is within normal limits.  No pneumothorax, pulmonary edema, pleural effusion or confluent pulmonary opacity. No acute osseous abnormality identified.  IMPRESSION: No acute cardiopulmonary abnormality.   Original Report Authenticated By: Roselyn Reef, M.D.    Nm Pulmonary Perf And Vent  10/13/2012  *RADIOLOGY REPORT*  Clinical Data:  Chest pain.  Elevated D-dimer.  Renal insufficiency during this admission precludes IV contrast administration.  NUCLEAR MEDICINE VENTILATION - PERFUSION LUNG SCAN  Technique:  Ventilation images were obtained in multiple projections using inhaled aerosol technetium 39m DTPA.  Perfusion images were obtained in multiple projections after intravenous injection of Tc-71m MAA.  Radiopharmaceuticals:  40.69mCi Tc-80m DTPA aerosol and 6.0 mCi Tc- 56m MAA.  Comparison: No prior nuclear imaging.  Two-view chest x-ray yesterday.  Findings:  Ventilation:  Clumping of the aerosol in the main stem bronchi bilaterally.  No ventilatory  defect.  Perfusion:  No segmental or subsegmental perfusion defects in either lung.  IMPRESSION: Normal pulmonary perfusion without evidence of pulmonary embolism. No ventilatory defects, with clumping of the aerosol in the central airways.   Original Report Authenticated By: Evangeline Dakin, M.D.    Labs: Basic Metabolic Panel:  Recent Labs Lab 10/12/12 2215 10/13/12 0155  NA 137 136  K 3.7 3.8  CL 102 102  CO2 26 24  GLUCOSE 179* 132*  BUN 31* 31*  CREATININE 1.29 1.18  CALCIUM 9.0 8.9   Liver Function Tests:  Recent Labs Lab 10/12/12 2215  AST 22  ALT 16  ALKPHOS 62  BILITOT 0.7  PROT 6.5  ALBUMIN 3.4*   CBC:  Recent Labs Lab 10/12/12 2215 10/13/12 0155  WBC 6.4 6.7  HGB 13.0 13.2  HCT 35.3* 35.5*  MCV  87.8 88.1  PLT 160 156   Cardiac Enzymes:  Recent Labs Lab 10/12/12 2215 10/13/12 0156 10/13/12 0756 10/13/12 1314  TROPONINI <0.30 <0.30 <0.30 <0.30   CBG:  Recent Labs Lab 10/13/12 0808 10/13/12 1153 10/13/12 1623 10/13/12 2224 10/14/12 0729  GLUCAP 140* 155* 119* 114* 134*   Signed:  Madelyn Tlatelpa  [facility] 10/15/2012, 12:52 PM

## 2013-04-11 NOTE — Progress Notes (Signed)
Need orders please - pt coming for preop THURS 04/17/13 - thank you

## 2013-04-14 ENCOUNTER — Encounter (HOSPITAL_COMMUNITY): Payer: Self-pay | Admitting: Pharmacy Technician

## 2013-04-16 ENCOUNTER — Other Ambulatory Visit (HOSPITAL_COMMUNITY): Payer: Self-pay | Admitting: Orthopedic Surgery

## 2013-04-16 NOTE — Patient Instructions (Addendum)
20 HARDY WICHT  04/16/2013   Your procedure is scheduled on: 04-23-2013  Report to [facility] at 800 AM.  Call this number if you have problems the morning of surgery [phone]   Remember:   Do not eat food or drink liquids :After Midnight.     Take these medicines the morning of surgery with A SIP OF WATER: allopurinol, diltiazem                                SEE [place] may not have any metal on your body including hair pins and piercings  Do not wear jewelry, make-up.  Do not wear lotions, powders, or perfumes. You may wear deodorant.   Men may shave face and neck.  Do not bring valuables to the hospital. [place].  Contacts, dentures or bridgework may not be worn into surgery.  Leave suitcase in the car. After surgery it may be brought to your room.  For patients admitted to the hospital, checkout time is 11:00 AM the day of discharge.   Patients discharged the day of surgery will not be allowed to drive home.  Name and phone number of your driver:  Special Instructions: N/A   Please read over the following fact sheets that you were given: [place] preparing for surgery sheet  Call Zelphia Cairo RN pre op nurse if needed [phone]    [place].  PATIENT SIGNATURE___________________________________________  NURSE SIGNATURE_____________________________________________

## 2013-04-16 NOTE — Progress Notes (Signed)
ekg 10-12-12 epic Chest 2 view xray 10-12-12 epic

## 2013-04-17 ENCOUNTER — Encounter (HOSPITAL_COMMUNITY): Payer: Self-pay

## 2013-04-17 ENCOUNTER — Encounter (INDEPENDENT_AMBULATORY_CARE_PROVIDER_SITE_OTHER): Payer: Self-pay

## 2013-04-17 ENCOUNTER — Encounter (HOSPITAL_COMMUNITY)
Admission: RE | Admit: 2013-04-17 | Discharge: 2013-04-17 | Disposition: A | Discharge status: Home / Self Care | Payer: Medicare Other | Source: Ambulatory Visit | Attending: Orthopedic Surgery | Admitting: Orthopedic Surgery

## 2013-04-17 DIAGNOSIS — Z01818 Encounter for other preprocedural examination: Secondary | ICD-10-CM | POA: Insufficient documentation

## 2013-04-17 DIAGNOSIS — Z01812 Encounter for preprocedural laboratory examination: Secondary | ICD-10-CM | POA: Insufficient documentation

## 2013-04-17 LAB — COMPREHENSIVE METABOLIC PANEL
ALT: 22 U/L (ref 0–53)
AST: 23 U/L (ref 0–37)
Albumin: 3.6 g/dL (ref 3.5–5.2)
Alkaline Phosphatase: 74 U/L (ref 39–117)
BUN: 26 mg/dL — ABNORMAL HIGH (ref 6–23)
CO2: 25 mEq/L (ref 19–32)
Calcium: 9.7 mg/dL (ref 8.4–10.5)
Chloride: 99 mEq/L (ref 96–112)
Creatinine, Ser: 1.3 mg/dL (ref 0.50–1.35)
GFR calc Af Amer: 61 mL/min — ABNORMAL LOW (ref >90)
GFR calc non Af Amer: 52 mL/min — ABNORMAL LOW (ref >90)
Glucose, Bld: 306 mg/dL — ABNORMAL HIGH (ref 70–99)
Potassium: 4.4 mEq/L (ref 3.5–5.1)
Sodium: 132 mEq/L — ABNORMAL LOW (ref 135–145)
Total Bilirubin: 0.9 mg/dL (ref 0.3–1.2)
Total Protein: 7.1 g/dL (ref 6.0–8.3)

## 2013-04-17 LAB — CBC
HCT: 39.6 % (ref 39.0–52.0)
Hemoglobin: 14.1 g/dL (ref 13.0–17.0)
RBC: 4.47 MIL/uL (ref 4.22–5.81)
WBC: 6.8 10*3/uL (ref 4.0–10.5)

## 2013-04-17 LAB — PROTIME-INR
INR: 0.94 (ref 0.00–1.49)
Prothrombin Time: 12.4 seconds (ref 11.6–15.2)

## 2013-04-17 LAB — URINALYSIS, ROUTINE W REFLEX MICROSCOPIC
Bilirubin Urine: NEGATIVE
Glucose, UA: >1000 mg/dL — AB
Hgb urine dipstick: NEGATIVE
Ketones, ur: NEGATIVE mg/dL
Leukocytes, UA: NEGATIVE
Nitrite: NEGATIVE
Protein, ur: 100 mg/dL — AB
Specific Gravity, Urine: 1.024 (ref 1.005–1.030)
Urobilinogen, UA: 0.2 mg/dL (ref 0.0–1.0)
pH: 6 (ref 5.0–8.0)

## 2013-04-17 LAB — URINE MICROSCOPIC-ADD ON

## 2013-04-17 LAB — APTT: aPTT: 31 seconds (ref 24–37)

## 2013-04-17 NOTE — Progress Notes (Addendum)
lov note 03-24-2013 and ekg dated 03-24-2013 dr Wynonia Lawman cardiology on chart

## 2013-04-17 NOTE — Progress Notes (Signed)
Cmet, micro, ua results faxed by epic to dr Gladstone Lighter office.

## 2013-04-20 NOTE — H&P (Signed)
Robert Lucas is an 72 y.o. male.   Chief Complaint: right shoulder pain HPI: The patient is a 72 year old male who presented with shoulder complaints. They are right handed and presented reporting pain at the right shoulder that began two months ago.  He lifted a 5 gallon bucket of paint and felt a sharp pain in his right shoulder. Five days after the initial injury he was starting a leaf blower by pulling the start cord and felt pain going up and down his arm. The onset of symptoms was sudden. Symptoms are exacerbated by lifting. Symptoms are relieved by restricted activity. MRI revealed full thickness , retracted tear of the right rotator cuff.    Past Medical History  Diagnosis Date  . Diabetes   . Hypertension   . Hypercholesteremia   . Bell's palsy 09/14/2012  . Myocardial infarction 1990    Past Surgical History  Procedure Laterality Date  . Knee surgery Left yrs ago  . Rotator cuff repair Left yrs ago  . Cardiac catheterization    . Back surgery  yrs ago    lower    Family History  Problem Relation Age of Onset  . Dementia Mother   . Stroke Brother    Social History:  reports that he quit smoking about 24 years ago. His smoking use included Cigars. He has never used smokeless tobacco. He reports that he does not drink alcohol or use illicit drugs.  Allergies:  Allergies  Allergen Reactions  . Lisinopril Cough  . Penicillins Rash    Rash around ankles     Current outpatient prescriptions: allopurinol (ZYLOPRIM) 100 MG tablet, Take 100 mg by mouth 2 (two) times daily. , Disp: , Rfl: ;   aspirin EC 81 MG tablet, Take 81 mg by mouth every evening. , Disp: , Rfl: ;   diltiazem (DILACOR XR) 240 MG 24 hr capsule, Take 240 mg by mouth every morning. , Disp: , Rfl: ;   doxycycline (VIBRAMYCIN) 100 MG capsule, Take 100 mg by mouth 2 (two) times daily., Disp: , Rfl:  finasteride (PROSCAR) 5 MG tablet, Take 5 mg by mouth daily., Disp: , Rfl: ;   glipiZIDE (GLUCOTROL XL) 5  MG 24 hr tablet, Take 5 mg by mouth every morning., Disp: , Rfl: ;   indomethacin (INDOCIN) 25 MG capsule, Take 25 mg by mouth daily as needed. For back pain, Disp: , Rfl: ;   losartan (COZAAR) 100 MG tablet, Take 100 mg by mouth every evening. , Disp: , Rfl:  naproxen sodium (ANAPROX) 220 MG tablet, Take 220 mg by mouth 2 (two) times daily as needed (pain)., Disp: , Rfl: ;   niacin (NIASPAN) 1000 MG CR tablet, Take 1,000 mg by mouth at bedtime. , Disp: , Rfl: ;   simvastatin (ZOCOR) 80 MG tablet, Take 80 mg by mouth at bedtime., Disp: , Rfl: ;   terazosin (HYTRIN) 2 MG capsule, Take 2 mg by mouth at bedtime., Disp: , Rfl:    Review of Systems  Constitutional: Negative for fever, chills, weight loss, malaise/fatigue and diaphoresis.  HENT: Negative.   Eyes: Negative.   Respiratory: Negative.   Cardiovascular: Negative.   Gastrointestinal: Negative.   Genitourinary: Negative.   Musculoskeletal: Positive for joint pain. Negative for back pain, falls and myalgias.       Right shoulder pain  Skin: Negative.   Neurological: Positive for weakness.  Endo/Heme/Allergies: Negative.   Psychiatric/Behavioral: Negative.      Physical Exam  Constitutional: He is oriented to person, place, and time. He appears well-developed and well-nourished. No distress.  HENT:  Head: Normocephalic and atraumatic.  Right Ear: External ear normal.  Left Ear: External ear normal.  Nose: Nose normal.  Mouth/Throat: Oropharynx is clear and moist.  Eyes: Conjunctivae and EOM are normal.  Neck: Normal range of motion. Neck supple.  Cardiovascular: Normal rate, regular rhythm, normal heart sounds and intact distal pulses.   No murmur heard. Respiratory: Effort normal and breath sounds normal. No respiratory distress. He has no wheezes.  GI: Soft. Bowel sounds are normal. He exhibits no distension. There is no tenderness.  Musculoskeletal:       Right shoulder: He exhibits decreased range of motion, tenderness,  pain and decreased strength. He exhibits no swelling.       Left shoulder: Normal.       Right elbow: Normal.      Left elbow: Normal.       Right wrist: Normal.       Left wrist: Normal.       Cervical back: Normal.  2/5 strength in abduction in right shoulder  Neurological: He is alert and oriented to person, place, and time. He has normal reflexes. No sensory deficit.  Skin: No rash noted. He is not diaphoretic. No erythema.  Psychiatric: He has a normal mood and affect. His behavior is normal.     Assessment/Plan Right shoulder, rotator cuff tear He has a complete retracted tear of the right rotator cuff. He needs and open right shoulder rotator cuff repair with graft and anchors. We may or may not need to use a graft material that is made from calf skin. This is approved by the FDA and I have not had a rejection of that graft yet. Also, we may need to use anchors. These are polyethylene anchors that stay in the bone and we use those anchors to suture the tendon down in certain cases where the tendon is completely pulled off the bone. There is always a chance of a secondary infection obviously with any surgery but we do use antibiotics preop.     [address] PA-C  [place], [place] 04/20/2013, 11:48 AM

## 2013-04-23 ENCOUNTER — Encounter (HOSPITAL_COMMUNITY): Payer: Medicare Other | Admitting: Anesthesiology

## 2013-04-23 ENCOUNTER — Ambulatory Visit (HOSPITAL_COMMUNITY): Payer: Medicare Other | Admitting: Anesthesiology

## 2013-04-23 ENCOUNTER — Encounter (HOSPITAL_COMMUNITY)
Admission: RE | Disposition: A | Discharge status: Home / Self Care | Payer: Self-pay | Source: Ambulatory Visit | Attending: Orthopedic Surgery

## 2013-04-23 ENCOUNTER — Encounter (HOSPITAL_COMMUNITY): Payer: Self-pay

## 2013-04-23 ENCOUNTER — Observation Stay (HOSPITAL_COMMUNITY)
Admission: RE | Admit: 2013-04-23 | Discharge: 2013-04-24 | Disposition: A | Discharge status: Home / Self Care | Payer: Medicare Other | Source: Ambulatory Visit | Attending: Orthopedic Surgery | Admitting: Orthopedic Surgery

## 2013-04-23 DIAGNOSIS — I10 Essential (primary) hypertension: Secondary | ICD-10-CM | POA: Insufficient documentation

## 2013-04-23 DIAGNOSIS — I252 Old myocardial infarction: Secondary | ICD-10-CM | POA: Insufficient documentation

## 2013-04-23 DIAGNOSIS — M75121 Complete rotator cuff tear or rupture of right shoulder, not specified as traumatic: Secondary | ICD-10-CM

## 2013-04-23 DIAGNOSIS — E119 Type 2 diabetes mellitus without complications: Secondary | ICD-10-CM | POA: Insufficient documentation

## 2013-04-23 DIAGNOSIS — M75120 Complete rotator cuff tear or rupture of unspecified shoulder, not specified as traumatic: Secondary | ICD-10-CM | POA: Diagnosis present

## 2013-04-23 DIAGNOSIS — X500XXA Overexertion from strenuous movement or load, initial encounter: Secondary | ICD-10-CM | POA: Insufficient documentation

## 2013-04-23 DIAGNOSIS — Z79899 Other long term (current) drug therapy: Secondary | ICD-10-CM | POA: Insufficient documentation

## 2013-04-23 DIAGNOSIS — E7800 Pure hypercholesterolemia, unspecified: Secondary | ICD-10-CM | POA: Insufficient documentation

## 2013-04-23 DIAGNOSIS — S43429A Sprain of unspecified rotator cuff capsule, initial encounter: Principal | ICD-10-CM | POA: Insufficient documentation

## 2013-04-23 HISTORY — PX: SHOULDER OPEN ROTATOR CUFF REPAIR: SHX2407

## 2013-04-23 LAB — GLUCOSE, CAPILLARY
Glucose-Capillary: 346 mg/dL — ABNORMAL HIGH (ref 70–99)
Glucose-Capillary: 419 mg/dL — ABNORMAL HIGH (ref 70–99)
Glucose-Capillary: 427 mg/dL — ABNORMAL HIGH (ref 70–99)

## 2013-04-23 SURGERY — REPAIR, ROTATOR CUFF, OPEN
Anesthesia: General | Site: Shoulder | Laterality: Right | Wound class: Clean

## 2013-04-23 MED ORDER — BISACODYL 10 MG RE SUPP: 10.0000 mg | Freq: Every day | RECTAL | Status: DC | PRN

## 2013-04-23 MED ORDER — DEXAMETHASONE SODIUM PHOSPHATE 10 MG/ML IJ SOLN
INTRAMUSCULAR | Status: DC | PRN
  Administered 2013-04-23: 10 mg via INTRAVENOUS

## 2013-04-23 MED ORDER — ATORVASTATIN CALCIUM 40 MG PO TABS
40.0000 mg | ORAL_TABLET | Freq: Every day | ORAL | Status: DC
  Administered 2013-04-23: 17:00:00 40 mg via ORAL
  Filled 2013-04-23 (×2): qty 1

## 2013-04-23 MED ORDER — MORPHINE SULFATE 2 MG/ML IJ SOLN
1.0000 mg | INTRAMUSCULAR | Status: DC | PRN
  Administered 2013-04-23 (×2): 1 mg via INTRAVENOUS
  Filled 2013-04-23 (×2): qty 1

## 2013-04-23 MED ORDER — ALBUTEROL SULFATE HFA 108 (90 BASE) MCG/ACT IN AERS
2.0000 | INHALATION_SPRAY | Freq: Four times a day (QID) | RESPIRATORY_TRACT | Status: DC | PRN
  Filled 2013-04-23: qty 6.7

## 2013-04-23 MED ORDER — LOSARTAN POTASSIUM 50 MG PO TABS
100.0000 mg | ORAL_TABLET | Freq: Every evening | ORAL | Status: DC
  Administered 2013-04-23: 17:00:00 100 mg via ORAL
  Filled 2013-04-23 (×2): qty 2

## 2013-04-23 MED ORDER — FENTANYL CITRATE 0.05 MG/ML IJ SOLN
INTRAMUSCULAR | Status: DC | PRN
  Administered 2013-04-23: 100 ug via INTRAVENOUS
  Administered 2013-04-23 (×2): 50 ug via INTRAVENOUS

## 2013-04-23 MED ORDER — PROMETHAZINE HCL 25 MG/ML IJ SOLN: 6.2500 mg | INTRAMUSCULAR | Status: DC | PRN

## 2013-04-23 MED ORDER — THROMBIN 5000 UNITS EX SOLR
CUTANEOUS | Status: DC | PRN
  Administered 2013-04-23: 10000 [IU] via TOPICAL

## 2013-04-23 MED ORDER — HYDROMORPHONE HCL PF 1 MG/ML IJ SOLN
INTRAMUSCULAR | Status: AC
  Filled 2013-04-23: qty 1

## 2013-04-23 MED ORDER — EPHEDRINE SULFATE 50 MG/ML IJ SOLN
INTRAMUSCULAR | Status: DC | PRN
  Administered 2013-04-23 (×2): 5 mg via INTRAVENOUS

## 2013-04-23 MED ORDER — METHOCARBAMOL 500 MG PO TABS
500.0000 mg | ORAL_TABLET | Freq: Four times a day (QID) | ORAL | Status: DC | PRN
  Administered 2013-04-23: 23:00:00 500 mg via ORAL
  Filled 2013-04-23: qty 1

## 2013-04-23 MED ORDER — DILTIAZEM HCL ER 240 MG PO CP24
240.0000 mg | ORAL_CAPSULE | Freq: Every morning | ORAL | Status: DC
  Administered 2013-04-24: 240 mg via ORAL
  Filled 2013-04-23: qty 1

## 2013-04-23 MED ORDER — ALLOPURINOL 100 MG PO TABS
100.0000 mg | ORAL_TABLET | Freq: Two times a day (BID) | ORAL | Status: DC
  Administered 2013-04-23 – 2013-04-24 (×2): 100 mg via ORAL
  Filled 2013-04-23 (×3): qty 1

## 2013-04-23 MED ORDER — LACTATED RINGERS IV SOLN
INTRAVENOUS | Status: DC
  Administered 2013-04-23 (×2): via INTRAVENOUS

## 2013-04-23 MED ORDER — ACETAMINOPHEN 325 MG PO TABS: 650.0000 mg | ORAL_TABLET | Freq: Four times a day (QID) | ORAL | Status: DC | PRN

## 2013-04-23 MED ORDER — OXYCODONE-ACETAMINOPHEN 5-325 MG PO TABS
1.0000 | ORAL_TABLET | ORAL | Status: DC | PRN
  Administered 2013-04-23 (×2): 1 via ORAL
  Administered 2013-04-23 – 2013-04-24 (×2): 2 via ORAL
  Filled 2013-04-23: qty 1
  Filled 2013-04-23: qty 2
  Filled 2013-04-23: qty 1
  Filled 2013-04-23: qty 2

## 2013-04-23 MED ORDER — CLINDAMYCIN PHOSPHATE 900 MG/50ML IV SOLN
INTRAVENOUS | Status: AC
  Filled 2013-04-23: qty 50

## 2013-04-23 MED ORDER — METHOCARBAMOL 100 MG/ML IJ SOLN
500.0000 mg | Freq: Four times a day (QID) | INTRAVENOUS | Status: DC | PRN
  Administered 2013-04-23: 500 mg via INTRAVENOUS
  Filled 2013-04-23 (×2): qty 5

## 2013-04-23 MED ORDER — ONDANSETRON HCL 4 MG PO TABS: 4.0000 mg | ORAL_TABLET | Freq: Four times a day (QID) | ORAL | Status: DC | PRN

## 2013-04-23 MED ORDER — ACETAMINOPHEN 650 MG RE SUPP: 650.0000 mg | Freq: Four times a day (QID) | RECTAL | Status: DC | PRN

## 2013-04-23 MED ORDER — GLIPIZIDE ER 5 MG PO TB24
5.0000 mg | ORAL_TABLET | Freq: Every day | ORAL | Status: DC
  Administered 2013-04-24: 5 mg via ORAL
  Filled 2013-04-23 (×2): qty 1

## 2013-04-23 MED ORDER — BUPIVACAINE LIPOSOME 1.3 % IJ SUSP
20.0000 mL | Freq: Once | INTRAMUSCULAR | Status: AC
  Administered 2013-04-23: 20 mL
  Filled 2013-04-23 (×2): qty 20

## 2013-04-23 MED ORDER — PROPOFOL 10 MG/ML IV BOLUS
INTRAVENOUS | Status: DC | PRN
  Administered 2013-04-23: 150 mg via INTRAVENOUS

## 2013-04-23 MED ORDER — INSULIN ASPART 100 UNIT/ML ~~LOC~~ SOLN
0.0000 [IU] | Freq: Three times a day (TID) | SUBCUTANEOUS | Status: DC
  Administered 2013-04-23: 7 [IU] via SUBCUTANEOUS
  Administered 2013-04-24: 08:00:00 5 [IU] via SUBCUTANEOUS
  Administered 2013-04-24: 12:00:00 3 [IU] via SUBCUTANEOUS

## 2013-04-23 MED ORDER — LIDOCAINE HCL (CARDIAC) 20 MG/ML IV SOLN
INTRAVENOUS | Status: DC | PRN
  Administered 2013-04-23: 100 mg via INTRAVENOUS

## 2013-04-23 MED ORDER — LACTATED RINGERS IV SOLN
INTRAVENOUS | Status: DC
  Administered 2013-04-23: 23:00:00 via INTRAVENOUS

## 2013-04-23 MED ORDER — INSULIN ASPART 100 UNIT/ML ~~LOC~~ SOLN
0.0000 [IU] | Freq: Every day | SUBCUTANEOUS | Status: DC
  Administered 2013-04-23: 5 [IU] via SUBCUTANEOUS

## 2013-04-23 MED ORDER — SODIUM CHLORIDE 0.9 % IR SOLN
Status: DC | PRN
  Administered 2013-04-23: 11:00:00

## 2013-04-23 MED ORDER — CLINDAMYCIN PHOSPHATE 600 MG/50ML IV SOLN
600.0000 mg | Freq: Four times a day (QID) | INTRAVENOUS | Status: AC
  Administered 2013-04-23 – 2013-04-24 (×3): 600 mg via INTRAVENOUS
  Filled 2013-04-23 (×3): qty 50

## 2013-04-23 MED ORDER — TERAZOSIN HCL 2 MG PO CAPS
2.0000 mg | ORAL_CAPSULE | Freq: Every day | ORAL | Status: DC
  Administered 2013-04-23: 23:00:00 2 mg via ORAL
  Filled 2013-04-23 (×2): qty 1

## 2013-04-23 MED ORDER — PHENOL 1.4 % MT LIQD: 1.0000 | OROMUCOSAL | Status: DC | PRN

## 2013-04-23 MED ORDER — CISATRACURIUM BESYLATE (PF) 10 MG/5ML IV SOLN
INTRAVENOUS | Status: DC | PRN
  Administered 2013-04-23: 10 mg via INTRAVENOUS

## 2013-04-23 MED ORDER — FLEET ENEMA 7-19 GM/118ML RE ENEM: 1.0000 | ENEMA | Freq: Once | RECTAL | Status: AC | PRN

## 2013-04-23 MED ORDER — METHOCARBAMOL 500 MG PO TABS: 500.0000 mg | ORAL_TABLET | Freq: Four times a day (QID) | ORAL | Status: DC | PRN

## 2013-04-23 MED ORDER — THROMBIN 5000 UNITS EX SOLR
CUTANEOUS | Status: AC
  Filled 2013-04-23: qty 10000

## 2013-04-23 MED ORDER — METOCLOPRAMIDE HCL 5 MG/ML IJ SOLN
5.0000 mg | Freq: Three times a day (TID) | INTRAMUSCULAR | Status: DC | PRN
  Administered 2013-04-23 – 2013-04-24 (×2): 10 mg via INTRAVENOUS
  Filled 2013-04-23 (×2): qty 2

## 2013-04-23 MED ORDER — METOCLOPRAMIDE HCL 10 MG PO TABS: 5.0000 mg | ORAL_TABLET | Freq: Three times a day (TID) | ORAL | Status: DC | PRN

## 2013-04-23 MED ORDER — HYDROCODONE-ACETAMINOPHEN 5-325 MG PO TABS
1.0000 | ORAL_TABLET | ORAL | Status: DC | PRN
  Administered 2013-04-24: 11:00:00 2 via ORAL
  Filled 2013-04-23: qty 2

## 2013-04-23 MED ORDER — POLYETHYLENE GLYCOL 3350 17 G PO PACK: 17.0000 g | PACK | Freq: Every day | ORAL | Status: DC | PRN

## 2013-04-23 MED ORDER — OXYCODONE HCL 5 MG PO TABS: 5.0000 mg | ORAL_TABLET | ORAL | Status: DC | PRN

## 2013-04-23 MED ORDER — CLINDAMYCIN PHOSPHATE 900 MG/50ML IV SOLN
900.0000 mg | INTRAVENOUS | Status: AC
  Administered 2013-04-23: 900 mg via INTRAVENOUS

## 2013-04-23 MED ORDER — ONDANSETRON HCL 4 MG/2ML IJ SOLN
4.0000 mg | Freq: Four times a day (QID) | INTRAMUSCULAR | Status: DC | PRN
  Administered 2013-04-24 (×2): 4 mg via INTRAVENOUS
  Filled 2013-04-23 (×2): qty 2

## 2013-04-23 MED ORDER — MENTHOL 3 MG MT LOZG
1.0000 | LOZENGE | OROMUCOSAL | Status: DC | PRN
  Filled 2013-04-23: qty 9

## 2013-04-23 MED ORDER — NIACIN ER (ANTIHYPERLIPIDEMIC) 500 MG PO TBCR
1000.0000 mg | EXTENDED_RELEASE_TABLET | Freq: Every day | ORAL | Status: DC
  Administered 2013-04-23: 1000 mg via ORAL
  Filled 2013-04-23 (×2): qty 2

## 2013-04-23 MED ORDER — HYDROMORPHONE HCL PF 1 MG/ML IJ SOLN
0.2500 mg | INTRAMUSCULAR | Status: DC | PRN
  Administered 2013-04-23 (×4): 0.5 mg via INTRAVENOUS

## 2013-04-23 MED ORDER — FINASTERIDE 5 MG PO TABS
5.0000 mg | ORAL_TABLET | Freq: Every day | ORAL | Status: DC
  Administered 2013-04-24: 10:00:00 5 mg via ORAL
  Filled 2013-04-23: qty 1

## 2013-04-23 MED ORDER — ALBUTEROL SULFATE (5 MG/ML) 0.5% IN NEBU
2.5000 mg | INHALATION_SOLUTION | RESPIRATORY_TRACT | Status: DC | PRN
  Administered 2013-04-24: 2.5 mg via RESPIRATORY_TRACT
  Filled 2013-04-23: qty 0.5

## 2013-04-23 MED ORDER — ALBUTEROL SULFATE (5 MG/ML) 0.5% IN NEBU
INHALATION_SOLUTION | RESPIRATORY_TRACT | Status: AC
  Administered 2013-04-23: 19:00:00 2.5 mg
  Filled 2013-04-23: qty 0.5

## 2013-04-23 MED ORDER — MENTHOL 3 MG MT LOZG: 1.0000 | LOZENGE | OROMUCOSAL | Status: DC | PRN

## 2013-04-23 MED ORDER — PHENYLEPHRINE HCL 10 MG/ML IJ SOLN
INTRAMUSCULAR | Status: DC | PRN
  Administered 2013-04-23: 120 ug via INTRAVENOUS
  Administered 2013-04-23: 160 ug via INTRAVENOUS
  Administered 2013-04-23: 120 ug via INTRAVENOUS

## 2013-04-23 SURGICAL SUPPLY — 50 items
ADH SKN CLS APL DERMABOND .7 (GAUZE/BANDAGES/DRESSINGS) ×1
ANCH SUT 2 5.5 BABSR ASCP (Orthopedic Implant) ×2 IMPLANT
ANCHOR PEEK ZIP 5.5 NDL NO2 (Orthopedic Implant) ×2 IMPLANT
BAG SPEC THK2 15X12 ZIP CLS (MISCELLANEOUS) ×1
BAG ZIPLOCK 12X15 (MISCELLANEOUS) ×2 IMPLANT
BLADE OSCILLATING/SAGITTAL (BLADE) ×2
BLADE SW THK.38XMED LNG THN (BLADE) ×1 IMPLANT
BNDG COHESIVE 6X5 TAN NS LF (GAUZE/BANDAGES/DRESSINGS) ×1 IMPLANT
BUR OVAL CARBIDE 4.0 (BURR) ×2 IMPLANT
CLEANER TIP ELECTROSURG 2X2 (MISCELLANEOUS) ×2 IMPLANT
CLOTH BEACON ORANGE TIMEOUT ST (SAFETY) ×1 IMPLANT
DERMABOND ADVANCED (GAUZE/BANDAGES/DRESSINGS) ×1
DERMABOND ADVANCED .7 DNX12 (GAUZE/BANDAGES/DRESSINGS) ×1 IMPLANT
DRAPE POUCH INSTRU U-SHP 10X18 (DRAPES) ×2 IMPLANT
DRSG AQUACEL AG ADV 3.5X 6 (GAUZE/BANDAGES/DRESSINGS) ×2 IMPLANT
DRSG PAD ABDOMINAL 8X10 ST (GAUZE/BANDAGES/DRESSINGS) ×4 IMPLANT
DURAPREP 26ML APPLICATOR (WOUND CARE) ×2 IMPLANT
ELECT REM PT RETURN 9FT ADLT (ELECTROSURGICAL) ×2
ELECTRODE REM PT RTRN 9FT ADLT (ELECTROSURGICAL) ×1 IMPLANT
GLOVE BIOGEL PI IND STRL 8 (GLOVE) ×1 IMPLANT
GLOVE BIOGEL PI INDICATOR 8 (GLOVE) ×1
GLOVE ECLIPSE 8.0 STRL XLNG CF (GLOVE) ×4 IMPLANT
GLOVE SURG SS PI 6.5 STRL IVOR (GLOVE) ×4 IMPLANT
GOWN PREVENTION PLUS LG XLONG (DISPOSABLE) ×6 IMPLANT
KIT BASIN OR (CUSTOM PROCEDURE TRAY) ×2 IMPLANT
KIT POSITION SHOULDER SCHLEI (MISCELLANEOUS) ×2 IMPLANT
MANIFOLD NEPTUNE II (INSTRUMENTS) ×1 IMPLANT
NDL MA TROC 1/2 (NEEDLE) IMPLANT
NEEDLE MA TROC 1/2 (NEEDLE) IMPLANT
NS IRRIG 1000ML POUR BTL (IV SOLUTION) IMPLANT
PACK SHOULDER CUSTOM OPM052 (CUSTOM PROCEDURE TRAY) ×2 IMPLANT
PASSER SUT SWANSON 36MM LOOP (INSTRUMENTS) IMPLANT
PATCH TISSUE MEND 3X3CM (Orthopedic Implant) ×1 IMPLANT
POSITIONER SURGICAL ARM (MISCELLANEOUS) ×1 IMPLANT
SLING ARM IMMOBILIZER LRG (SOFTGOODS) ×2 IMPLANT
SPONGE SURGIFOAM ABS GEL 100 (HEMOSTASIS) ×1 IMPLANT
STAPLER VISISTAT 35W (STAPLE) ×2 IMPLANT
STRIP CLOSURE SKIN 1/2X4 (GAUZE/BANDAGES/DRESSINGS) ×1 IMPLANT
SUCTION FRAZIER 12FR DISP (SUCTIONS) ×2 IMPLANT
SUT BONE WAX W31G (SUTURE) ×1 IMPLANT
SUT ETHIBOND NAB CT1 #1 30IN (SUTURE) ×2 IMPLANT
SUT MNCRL AB 4-0 PS2 18 (SUTURE) ×2 IMPLANT
SUT VIC AB 0 CT1 27 (SUTURE) ×2
SUT VIC AB 0 CT1 27XBRD ANTBC (SUTURE) ×1 IMPLANT
SUT VIC AB 1 CT1 27 (SUTURE) ×4
SUT VIC AB 1 CT1 27XBRD ANTBC (SUTURE) ×2 IMPLANT
SUT VIC AB 1 CT1 36 (SUTURE) ×2 IMPLANT
SUT VIC AB 2-0 CT1 27 (SUTURE) ×4
SUT VIC AB 2-0 CT1 27XBRD (SUTURE) IMPLANT
TOWEL OR 17X26 10 PK STRL BLUE (TOWEL DISPOSABLE) ×3 IMPLANT

## 2013-04-23 NOTE — Anesthesia Preprocedure Evaluation (Addendum)
Anesthesia Evaluation  Patient identified by MRN, date of birth, ID band Patient awake  General Assessment Comment: Diabetes     .  Hypertension     .  Hypercholesteremia     .  Bell's palsy  09/14/2012   .  Myocardial infarction  1990     Reviewed: Allergy & Precautions, H&P , NPO status , Patient's Chart, lab work & pertinent test results  Airway Mallampati: II TM Distance: >3 FB Neck ROM: Full    Dental no notable dental hx.    Pulmonary neg pulmonary ROS,  breath sounds clear to auscultation  Pulmonary exam normal       Cardiovascular Exercise Tolerance: Good hypertension, Pt. on medications + Past MI negative cardio ROS  Rhythm:Regular Rate:Normal     Neuro/Psych negative neurological ROS  negative psych ROS   GI/Hepatic negative GI ROS, Neg liver ROS,   Endo/Other  diabetes, Type 2, Oral Hypoglycemic Agents  Renal/GU negative Renal ROS  negative genitourinary   Musculoskeletal negative musculoskeletal ROS (+)   Abdominal   Peds negative pediatric ROS (+)  Hematology negative hematology ROS (+)   Anesthesia Other Findings   Reproductive/Obstetrics negative OB ROS                          Anesthesia Physical Anesthesia Plan  ASA: III  Anesthesia Plan: General   Post-op Pain Management:    Induction: Intravenous  Airway Management Planned: Oral ETT  Additional Equipment:   Intra-op Plan:   Post-operative Plan: Extubation in OR  Informed Consent: I have reviewed the patients History and Physical, chart, labs and discussed the procedure including the risks, benefits and alternatives for the proposed anesthesia with the patient or authorized representative who has indicated his/her understanding and acceptance.   Dental advisory given  Plan Discussed with: CRNA  Anesthesia Plan Comments:         Anesthesia Quick Evaluation

## 2013-04-23 NOTE — Brief Op Note (Signed)
04/23/2013  12:15 PM  PATIENT:  Robert Lucas  72 y.o. male  PRE-OPERATIVE DIAGNOSIS:  RIGHT SHOULDER ROTATOR CUFF TEAR ,Severe,Complete,Retracted Tear of Right Rotator Cuff.  POST-OPERATIVE DIAGNOSIS:  right shoulder rotator cuff tear,Severe,Complete,Retracteed Tear of Right Rotator Cuff.  PROCEDURE:  Procedure(s): RIGHT SHOULDER ROTATOR CUFF REPAIR WITH GRAFT AND ANCHORS  (Right);Severe Tear.  SURGEON:  Surgeon(s) and Role:    * Tobi Bastos, MD - Primary  PHYSICIAN ASSISTANT: Ardeen Jourdain PA  ASSISTANTS:Amber [place] PA  ANESTHESIA:   general  EBL:  Total I/O In: 1000 [I.V.:1000] Out: -   BLOOD ADMINISTERED:none  DRAINS: none   LOCAL MEDICATIONS USED:  BUPIVICAINE ;Exparel 20cc  SPECIMEN:  No Specimen  DISPOSITION OF SPECIMEN:  N/A  COUNTS:  YES  TOURNIQUET:  * No tourniquets in log *  DICTATION: .Other Dictation: Dictation Number [id number]  PLAN OF CARE: Admit for overnight observation  PATIENT DISPOSITION:  Short Stay   Delay start of Pharmacological VTE agent (>24hrs) due to surgical blood loss or risk of bleeding: yes

## 2013-04-23 NOTE — Anesthesia Postprocedure Evaluation (Signed)
  Anesthesia Post-op Note  Patient: Robert Lucas  Procedure(s) Performed: Procedure(s) (LRB): RIGHT SHOULDER ROTATOR CUFF REPAIR WITH GRAFT AND ANCHORS  (Right)  Patient Location: PACU  Anesthesia Type: General  Level of Consciousness: awake and alert   Airway and Oxygen Therapy: Patient Spontanous Breathing  Post-op Pain: mild  Post-op Assessment: Post-op Vital signs reviewed, Patient's Cardiovascular Status Stable, Respiratory Function Stable, Patent Airway and No signs of Nausea or vomiting  Last Vitals:  Filed Vitals:   04/23/13 1345  BP: 137/72  Pulse: 66  Temp: 36.3 C  Resp: 14    Post-op Vital Signs: stable   Complications: No apparent anesthesia complications

## 2013-04-23 NOTE — Transfer of Care (Signed)
Immediate Anesthesia Transfer of Care Note  Patient: Robert Lucas  Procedure(s) Performed: Procedure(s): RIGHT SHOULDER ROTATOR CUFF REPAIR WITH GRAFT AND ANCHORS  (Right)  Patient Location: PACU  Anesthesia Type:General  Level of Consciousness: awake, alert , sedated and patient cooperative  Airway & Oxygen Therapy: Patient Spontanous Breathing and Patient connected to face mask oxygen  Post-op Assessment: Report given to PACU RN and Post -op Vital signs reviewed and stable  Post vital signs: Reviewed and stable  Complications: No apparent anesthesia complications

## 2013-04-23 NOTE — Interval H&P Note (Signed)
History and Physical Interval Note:  04/23/2013 10:19 AM  Robert Lucas  has presented today for surgery, with the diagnosis of RIGHT SHOULDER ROTATOR CUFF TEAR   The various methods of treatment have been discussed with the patient and family. After consideration of risks, benefits and other options for treatment, the patient has consented to  Procedure(s): RIGHT SHOULDER ROTATOR CUFF REPAIR WITH GRAFT AND ANCHORS  (Right) as a surgical intervention .  The patient's history has been reviewed, patient examined, no change in status, stable for surgery.  I have reviewed the patient's chart and labs.  Questions were answered to the patient's satisfaction.     [name] A

## 2013-04-24 LAB — GLUCOSE, CAPILLARY
Glucose-Capillary: 297 mg/dL — ABNORMAL HIGH (ref 70–99)
Glucose-Capillary: 225 mg/dL — ABNORMAL HIGH (ref 70–99)

## 2013-04-24 NOTE — Progress Notes (Signed)
Subjective: 1 Day Post-Op Procedure(s) (LRB): RIGHT SHOULDER ROTATOR CUFF REPAIR WITH GRAFT AND ANCHORS  (Right) Patient reports pain as 4 on 0-10 scale. Doing well. Minimal wheezing .Oxygen SAT. Is 99%   Objective: Vital signs in last 24 hours: Temp:  [97 F (36.1 C)-98.5 F (36.9 C)] 98.2 F (36.8 C) (10/23 0451) Pulse Rate:  [62-80] 70 (10/23 0451) Resp:  [14-18] 16 (10/23 0451) BP: (110-143)/(58-82) 131/58 mmHg (10/23 0451) SpO2:  [97 %-100 %] 99 % (10/23 0451) Weight:  [83.734 kg (184 lb 9.6 oz)] 83.734 kg (184 lb 9.6 oz) (10/22 1542)  Intake/Output from previous day: 10/22 0701 - 10/23 0700 In: 3450 [P.O.:600; I.V.:2800; IV Piggyback:50] Out: 800 [Urine:800] Intake/Output this shift:    No results found for this basename: HGB,  in the last 72 hours No results found for this basename: WBC, RBC, HCT, PLT,  in the last 72 hours No results found for this basename: NA, K, CL, CO2, BUN, CREATININE, GLUCOSE, CALCIUM,  in the last 72 hours No results found for this basename: LABPT, INR,  in the last 72 hours  Neurovascular intact  Assessment/Plan: 1 Day Post-Op Procedure(s) (LRB): RIGHT SHOULDER ROTATOR CUFF REPAIR WITH GRAFT AND ANCHORS  (Right) Discharge home with home health. Will see in one week.  Robert Lucas A 04/24/2013, 7:28 AM

## 2013-04-24 NOTE — Progress Notes (Signed)
Pt's CBG @ 2127 was 427. Pt asymptomatic. Paged on call PA. Received call back from [facility]. Rec'd verbal order to give pt 5 units of Novolog per HS sliding scale.  Will continue to monitor pt.

## 2013-04-24 NOTE — Evaluation (Signed)
Occupational Therapy Evaluation Patient Details Name: SHAMARION CRUPI MRN: [id number] DOB: [birth year] Today's Date: 04/24/2013 Time: [id number] OT Time Calculation (min): 33 min  OT Assessment / Plan / Recommendation History of present illness RIGHT SHOULDER ROTATOR CUFF REPAIR WITH GRAFT AND ANCHORS  (Right   Clinical Impression   Pt and wife educated on all shoulder care information. Wife practiced hands on with self care tasks. No further questions or concerns.     OT Assessment  Progress rehab of shoulder as ordered by MD at follow-up appointment    Follow Up Recommendations  No OT follow up;Supervision/Assistance - 24 hour    Barriers to Discharge      Equipment Recommendations  None recommended by OT    Recommendations for Other Services    Frequency       Precautions / Restrictions Precautions Precautions: Shoulder Shoulder Interventions: Shoulder sling/immobilizer Precaution Booklet Issued: Yes (comment) Precaution Comments: Issued shoulder care handout and reviewed all information with pt/spouse Restrictions Weight Bearing Restrictions: Yes RUE Weight Bearing: Non weight bearing   Pertinent Vitals/Pain 8/10 start of session R shoulder 6/10 R shoulder as session progressed; reposition, rest    ADL       OT Diagnosis:    OT Problem List:   OT Treatment Interventions:     OT Goals(Current goals can be found in the care plan section)    Visit Information  Last OT Received On: 04/24/13 Assistance Needed: +1 History of Present Illness: RIGHT SHOULDER ROTATOR CUFF REPAIR WITH GRAFT AND ANCHORS  (Right       Prior Functioning     Home Living Family/patient expects to be discharged to:: Private residence Living Arrangements: Spouse/significant other Home Equipment: Shower seat         Vision/Perception     Cognition  Cognition Arousal/Alertness: Awake/alert Behavior During Therapy: WFL for tasks assessed/performed Overall Cognitive Status:  Within Functional Limits for tasks assessed    Extremity/Trunk Assessment       Mobility       Exercise Donning/doffing shirt without moving shoulder: Caregiver independent with task (cues initially for wife to not tug on shirt to move shoulder but then she did well with finishing) Method for sponge bathing under operated UE: Caregiver independent with task Donning/doffing sling/immobilizer: Caregiver independent with task; (initially a few cues for proper strapping but caregiver able to verbalize techniques and complete)  Correct positioning of sling/immobilizer: Caregiver independent with task;Patient able to independently direct caregiver Sling wearing schedule (on at all times/off for ADL's): Caregiver independent with task;Patient able to independently direct caregiver Proper positioning of operated UE when showering: Caregiver independent with task;Patient able to independently direct caregiver Positioning of UE while sleeping: Caregiver independent with task;Patient able to independently direct caregiver   Balance     End of Session OT - End of Session Activity Tolerance: Patient tolerated treatment well Patient left: in chair;with call bell/phone within reach;with family/visitor present  GO Functional Assessment Tool Used: clinical judgement Functional Limitation: Self care Self Care Current Status [id number]): At least 40 percent but less than 60 percent impaired, limited or restricted Self Care Goal Status [id number]): At least 40 percent but less than 60 percent impaired, limited or restricted Self Care Discharge Status [phone]): At least 40 percent but less than 60 percent impaired, limited or restricted   Jules Schick [id number] 04/24/2013, 11:18 AM

## 2013-04-24 NOTE — Discharge Summary (Signed)
Physician Discharge Summary  Patient ID: Robert Lucas MRN: [id number] DOB/AGE: [birth year] 74 y.o.  Admit date: 04/23/2013 Discharge date: 04/24/2013  Admission Diagnoses:Complete Tear of Right Rotator Cuff  Discharge Diagnoses:Complete Tear of Right Rotator Cuff.  Active Problems:   Complete tear of rotator cuff   Discharged Condition: good  Hospital Course: No post -Op complications except Blood Sugar is increased.  Consults: rehabilitation medicine  Significant Diagnostic Studies: None  Treatments:Ot for sling instructions.  Discharge Exam: Blood pressure 131/58, pulse 70, temperature 98.2 F (36.8 C), temperature source Oral, resp. rate 16, height 5\' 4"  (1.626 m), weight 83.734 kg (184 lb 9.6 oz), SpO2 99.00%. Extremities: Moves fingers well. Circulation intact.  Disposition: 01-Home or Self Care  Discharge Orders   Future Orders Complete By Expires   Call MD / Call 911  As directed    Comments:     If you experience chest pain or shortness of breath, CALL 911 and be transported to the hospital emergency room.  If you develope a fever above 101 F, pus (white drainage) or increased drainage or redness at the wound, or calf pain, call your surgeon's office.   Constipation Prevention  As directed    Comments:     Drink plenty of fluids.  Prune juice may be helpful.  You may use a stool softener, such as Colace (over the counter) 100 mg twice a day.  Use MiraLax (over the counter) for constipation as needed.   Diet Carb Modified  As directed    Discharge instructions  As directed    Comments:     Keep your sling on at all times, including sleeping in your sling. The only time you should remove your sling is to shower only but you need to keep your hand against your chest while you shower.  Call Dr. Gladstone Lighter if any wound complications or temperature of 101 degrees F or over.  Call the office for an appointment to see Dr. Gladstone Lighter in two weeks: [phone] and ask for Dr.  Charlestine Night nurse, Brunilda Payor.   Increase activity slowly as tolerated  As directed    Lifting restrictions  As directed    Comments:     No lifting       Medication List    STOP taking these medications       doxycycline 100 MG capsule  Commonly known as:  VIBRAMYCIN     indomethacin 25 MG capsule  Commonly known as:  INDOCIN      TAKE these medications       allopurinol 100 MG tablet  Commonly known as:  ZYLOPRIM  Take 100 mg by mouth 2 (two) times daily.     aspirin EC 81 MG tablet  Take 81 mg by mouth every evening.     diltiazem 240 MG 24 hr capsule  Commonly known as:  DILACOR XR  Take 240 mg by mouth every morning.     finasteride 5 MG tablet  Commonly known as:  PROSCAR  Take 5 mg by mouth daily.     glipiZIDE 5 MG 24 hr tablet  Commonly known as:  GLUCOTROL XL  Take 5 mg by mouth every morning.     losartan 100 MG tablet  Commonly known as:  COZAAR  Take 100 mg by mouth every evening.     methocarbamol 500 MG tablet  Commonly known as:  ROBAXIN  Take 1 tablet (500 mg total) by mouth every 6 (six) hours as needed.  naproxen sodium 220 MG tablet  Commonly known as:  ANAPROX  Take 220 mg by mouth 2 (two) times daily as needed (pain).     niacin 1000 MG CR tablet  Commonly known as:  NIASPAN  Take 1,000 mg by mouth at bedtime.     oxyCODONE 5 MG immediate release tablet  Commonly known as:  ROXICODONE  Take 1-2 tablets (5-10 mg total) by mouth every 4 (four) hours as needed for pain.     simvastatin 80 MG tablet  Commonly known as:  ZOCOR  Take 80 mg by mouth at bedtime.     terazosin 2 MG capsule  Commonly known as:  HYTRIN  Take 2 mg by mouth at bedtime.           Follow-up Information   Follow up with Travonta Gill A, MD. Schedule an appointment as soon as possible for a visit in 10 days.   Specialty:  Orthopedic Surgery   Contact information:   [address] [place] [address] [id number]        Signed: Tobi Bastos 04/24/2013, 7:29 AM

## 2013-04-24 NOTE — Op Note (Signed)
NAMEMarland Kitchen  Robert Lucas, Robert Lucas NO.:  [id number]  MEDICAL RECORD NO.:  [id number]  LOCATION:  [id number]                         FACILITY:  [facility]  PHYSICIAN:  Kipp Brood. Leilene Diprima, M.D.DATE OF BIRTH:  01-08-1941  DATE OF PROCEDURE: DATE OF DISCHARGE:                              OPERATIVE REPORT   SURGEON:  Mihira Tozzi A. Gladstone Lighter, M.D.  ASSISTANT:  Ardeen Jourdain PA.  PREOPERATIVE DIAGNOSIS:  A complete retracted tear of the right rotator cuff.  POSTOPERATIVE DIAGNOSIS:  A complete retracted tear of the right rotator cuff.  OPERATIONS: 1. Open acromionectomy, right shoulder. 2. Open repair of a complete retracted tear of the right rotator cuff. 3. TissueMend graft with 2 Stryker anchors.  PROCEDURE:  Under general anesthesia, routine orthopedic prep and draping of the right shoulder was carried out.  The patient had Cleocin 900 mg IV.  At this particular time, appropriate time-out was carried out.  The patient was in the beach chair position.  Prior to that, I marked the appropriate right arm in the holding area.  Incision then was made over the anterior aspect of the right shoulder.  Bleeders were identified and cauterized.  I identified the acromion.  I stripped the deltoid tendon from the acromion in the usual fashion.  I split a small part of the deltoid muscle and when I went in, he had a severe thickened downsloping acromion.  I protected the underlying cuff that was remaining with the Bennett retractor and did an open acromionectomy with an oscillating saw, then utilized a bur to do an acromioplasty.  Once this was done, we completely irrigated the area.  We searched for the cuff that was severely completely torn, retracted more laterally and superiorly.  I burred the lateral articular surface of the humeral head for bleeding purposes.  I then grasped the cuff and brought it forward. Two anchor sutures were inserted.  I then repaired the cuff by suturing it down to the  bleeding bone.  Following that, a TissueMend graft was applied to cover these small barrier and that was remaining.  We utilized a second anchor for fixation distally.  We thoroughly irrigated out the area, inserted some thrombin-soaked Gelfoam.  Closed the wound layers in usual fashion.  We injected 20 mL of Exparel at the end of the procedure and he is going to be placed in a shoulder immobilizer.          ______________________________ Kipp Brood Gladstone Lighter, M.D.    RAG/MEDQ  D:  04/23/2013  T:  04/24/2013  Job:  [id number]

## 2016-10-02 ENCOUNTER — Other Ambulatory Visit: Payer: Self-pay | Admitting: Family Medicine

## 2016-10-02 DIAGNOSIS — R109 Unspecified abdominal pain: Secondary | ICD-10-CM

## 2016-10-05 ENCOUNTER — Ambulatory Visit
Admission: RE | Admit: 2016-10-05 | Discharge: 2016-10-05 | Disposition: A | Discharge status: Home / Self Care | Payer: Medicare Other | Source: Ambulatory Visit | Attending: Family Medicine | Admitting: Family Medicine

## 2016-10-05 DIAGNOSIS — R109 Unspecified abdominal pain: Secondary | ICD-10-CM

## 2016-12-26 ENCOUNTER — Other Ambulatory Visit: Payer: Self-pay | Admitting: Family Medicine

## 2016-12-26 DIAGNOSIS — M5414 Radiculopathy, thoracic region: Secondary | ICD-10-CM

## 2017-01-11 ENCOUNTER — Ambulatory Visit
Admission: RE | Admit: 2017-01-11 | Discharge: 2017-01-11 | Disposition: A | Discharge status: Home / Self Care | Payer: Medicare Other | Source: Ambulatory Visit | Attending: Family Medicine | Admitting: Family Medicine

## 2017-01-11 DIAGNOSIS — M5414 Radiculopathy, thoracic region: Secondary | ICD-10-CM

## 2017-03-23 ENCOUNTER — Ambulatory Visit: Payer: Self-pay | Admitting: General Surgery

## 2017-03-23 NOTE — H&P (Signed)
History of Present Illness Ralene Ok MD; 03/23/2017 12:06 PM) The patient is a 76 year old male who presents for evaluation of gall stones.  Chief Complaint: Right upper quadrant abdominal pain Patient is a 76 year old male, with a history of MI in the past, diabetes, hypertension, who comes in with a 7 month history of right-sided abdominal pain. Patient had extensive workup to include CT scan, MRI to evaluate his pain. Patient did appear to have cholelithiasis. There is no other significant findings.  Patient states that the pain is right-sided. He states it does not change with any meals, high fatty foods, spicy foods. He states blunt nature. He has no nausea or vomiting associated with this pain.  Patient sees Dr. Wynonia Lawman as his cardiologist.   Medication History Dalbert Mayotte, CMA; 03/23/2017 11:51 AM) Terazosin HCl (10MG  Capsule, Oral) Active. Simvastatin (80MG  Tablet, Oral) Active. Aspirin (81MG  Tablet Chewable, Oral) Active. Simvastatin (20MG  Tablet, Oral) Active. TraMADol HCl (50MG  Tablet, Oral) Active. Accu-Chek Aviva Plus (In Vitro) Active. Allopurinol (100MG  Tablet, Oral) Active. Finasteride (5MG  Tablet, Oral) Active. Levocetirizine Dihydrochloride (5MG  Tablet, Oral) Active. Losartan Potassium (100MG  Tablet, Oral) Active. Terazosin HCl (2MG  Capsule, Oral) Active. NovoLOG Mix 70/30 ((70-30) 100UNIT/ML Suspension, Subcutaneous) Active. Vitamin D (2000UNIT Capsule, Oral) Active. Medications Reconciled    Review of Systems Ralene Ok, MD; 03/23/2017 12:7 PM) General Present- Feeling well. Not Present- Fever. Respiratory Not Present- Cough and Difficulty Breathing. Cardiovascular Not Present- Chest Pain. Gastrointestinal Present- Abdominal Pain and Nausea. Musculoskeletal Not Present- Myalgia. Neurological Not Present- Weakness. All other systems negative  Vitals Dalbert Mayotte CMA; 03/23/2017 11:52 AM) 03/23/2017 11:51 AM Weight: 182.38  lb Height: 63in Height was reported by patient. Body Surface Area: 1.86 m Body Mass Index: 32.31 kg/m  Temp.: 97.53F  Pulse: 82 (Regular)  BP: 171/85 (Sitting, Left Arm, Standard)       Physical Exam Ralene Ok MD; 03/23/2017 12:06 PM) The physical exam findings are as follows: Note:Constitutional: No acute distress, conversant, appears stated age  Eyes: Anicteric sclerae, moist conjunctiva, no lid lag  Neck: No thyromegaly, trachea midline, no cervical lymphadenopathy  Lungs: Clear to auscultation biilaterally, normal respiratory effot  Cardiovascular: regular rate & rhythm, no murmurs, no peripheal edema, pedal pulses 2+  GI: Soft, no masses or hepatosplenomegaly, non-tender to palpation  MSK: Normal gait, no clubbing cyanosis, edema  Skin: No rashes, palpation reveals normal skin turgor  Psychiatric: Appropriate judgment and insight, oriented to person, place, and time    Assessment & Plan Ralene Ok MD; 03/23/2017 12:06 PM) SYMPTOMATIC CHOLELITHIASIS (K80.20) Impression: 76 year old male with synthetic cholelithiasis  1. We will obtain cardiac evaluation by Dr. Wynonia Lawman prior to scheduling surgery 2. We will proceed to the operating room for a laparoscopic cholecystectomy  3. Risks and benefits were discussed with the patient to generally include, but not limited to: infection, bleeding, possible need for post op ERCP, damage to the bile ducts, bile leak, and possible need for further surgery. Alternatives were offered and described. All questions were answered and the patient voiced understanding of the procedure and wishes to proceed at this point with a laparoscopic cholecystectomy

## 2017-04-27 NOTE — Pre-Procedure Instructions (Addendum)
Robert Lucas  04/27/2017      [facility] - [place] - [address] [place] [address] Phone: [phone] Fax: [phone]  [facility] [place], [place] - [address] [place] [address] [place] [address] Phone: [phone] Fax: [phone]    Your procedure is scheduled on   Wednesday 05/02/17  Report to [facility] at 630 A.M.  Call this number if you have problems the morning of surgery:  [phone]   Remember:  Do not eat food or drink liquids after midnight.  Take these medicines the morning of surgery with A SIP OF WATER   alfuzosin (uroxatral), diltiazem (tiazac), xyzal  7 days prior to surgery STOP taking any Aspirin (unless otherwise instructed by your surgeon), Aleve, Naproxen, Ibuprofen, Motrin, Advil, Goody's, BC's, all herbal medications, fish oil, and all vitamins    How to Manage Your Diabetes Before and After Surgery  Why is it important to control my blood sugar before and after surgery? . Improving blood sugar levels before and after surgery helps healing and can limit problems. . A way of improving blood sugar control is eating a healthy diet by: o  Eating less sugar and carbohydrates o  Increasing activity/exercise o  Talking with your doctor about reaching your blood sugar goals . High blood sugars (greater than 180 mg/dL) can raise your risk of infections and slow your recovery, so you will need to focus on controlling your diabetes during the weeks before surgery. . Make sure that the doctor who takes care of your diabetes knows about your planned surgery including the date and location.  How do I manage my blood sugar before surgery? . Check your blood sugar at least 4 times a day, starting 2 days before surgery, to make sure that the level is not too high or low. o Check your blood sugar  the morning of your surgery when you wake up and every 2 hours until you get to the Short Stay unit. . If your blood sugar is less than 70 mg/dL, you will need to treat for low blood sugar: o Do not take insulin. o Treat a low blood sugar (less than 70 mg/dL) with  cup of clear juice (cranberry or apple), 4 glucose tablets, OR glucose gel. o Recheck blood sugar in 15 minutes after treatment (to make sure it is greater than 70 mg/dL). If your blood sugar is not greater than 70 mg/dL on recheck, call [phone] for further instructions. . Report your blood sugar to the short stay nurse when you get to Short Stay.  . If you are admitted to the hospital after surgery: o Your blood sugar will be checked by the staff and you will probably be given insulin after surgery (instead of oral diabetes medicines) to make sure you have good blood sugar levels. o The goal for blood sugar control after surgery is 80-180 mg/dL.              WHAT DO I DO ABOUT MY DIABETES MEDICATION?   Marland Kitchen Do not take oral diabetes medicines (pills) the morning of surgery.  . THE NIGHT BEFORE SURGERY, take ___________ units of ___________insulin.       Marland Kitchen HE MORNING OF SURGERY, take _____________ units of __________insulin.  . The day of surgery, do not take other diabetes injectables, including Byetta (  exenatide), Bydureon (exenatide ER), Victoza (liraglutide), or Trulicity (dulaglutide).  . If your CBG is greater than 220 mg/dL, you may take  of your sliding scale (correction) dose of insulin.  Other Instructions:          Patient Signature:  Date:   Nurse Signature:  Date:   Reviewed and Endorsed by [facility] Patient Education Committee, August 2015  Do not wear jewelry, make-up or nail polish.  Do not wear lotions, powders, or perfumes, or deoderant.  Do not shave 48 hours prior to surgery.  Men may shave face and neck.  Do not bring valuables to the hospital.  [facility] is not responsible  for any belongings or valuables.  Contacts, dentures or bridgework may not be worn into surgery.  Leave your suitcase in the car.  After surgery it may be brought to your room.  For patients admitted to the hospital, discharge time will be determined by your treatment team.  Patients discharged the day of surgery will not be allowed to drive home.   Name and phone number of your driver:    Special instructions:  [place] - Preparing for Surgery  Before surgery, you can play an important role.  Because skin is not sterile, your skin needs to be as free of germs as possible.  You can reduce the number of germs on you skin by washing with CHG (chlorahexidine gluconate) soap before surgery.  CHG is an antiseptic cleaner which kills germs and bonds with the skin to continue killing germs even after washing.  Please DO NOT use if you have an allergy to CHG or antibacterial soaps.  If your skin becomes reddened/irritated stop using the CHG and inform your nurse when you arrive at Short Stay.  Do not shave (including legs and underarms) for at least 48 hours prior to the first CHG shower.  You may shave your face.  Please follow these instructions carefully:   1.  Shower with CHG Soap the night before surgery and the                                morning of Surgery.  2.  If you choose to wash your hair, wash your hair first as usual with your       normal shampoo.  3.  After you shampoo, rinse your hair and body thoroughly to remove the                      Shampoo.  4.  Use CHG as you would any other liquid soap.  You can apply chg directly       to the skin and wash gently with scrungie or a clean washcloth.  5.  Apply the CHG Soap to your body ONLY FROM THE NECK DOWN.        Do not use on open wounds or open sores.  Avoid contact with your eyes,       ears, mouth and genitals (private parts).  Wash genitals (private parts)       with your normal soap.  6.  Wash thoroughly, paying special  attention to the area where your surgery        will be performed.  7.  Thoroughly rinse your body with warm water from the neck down.  8.  DO NOT shower/wash with your normal soap after using and rinsing off  the CHG Soap.  9.  Pat yourself dry with a clean towel.            10.  Wear clean pajamas.            11.  Place clean sheets on your bed the night of your first shower and do not        sleep with pets.  Day of Surgery  Do not apply any lotions/deoderants the morning of surgery.  Please wear clean clothes to the hospital/surgery center.     Please read over the following fact sheets that you were given. Pain Booklet

## 2017-04-30 ENCOUNTER — Encounter (HOSPITAL_COMMUNITY)
Admission: RE | Admit: 2017-04-30 | Discharge: 2017-04-30 | Disposition: A | Discharge status: Home / Self Care | Payer: Medicare Other | Source: Ambulatory Visit | Attending: General Surgery | Admitting: General Surgery

## 2017-04-30 ENCOUNTER — Encounter (HOSPITAL_COMMUNITY): Payer: Self-pay

## 2017-04-30 DIAGNOSIS — E119 Type 2 diabetes mellitus without complications: Secondary | ICD-10-CM | POA: Diagnosis not present

## 2017-04-30 DIAGNOSIS — I10 Essential (primary) hypertension: Secondary | ICD-10-CM | POA: Diagnosis not present

## 2017-04-30 DIAGNOSIS — K8010 Calculus of gallbladder with chronic cholecystitis without obstruction: Secondary | ICD-10-CM | POA: Diagnosis not present

## 2017-04-30 DIAGNOSIS — Z79899 Other long term (current) drug therapy: Secondary | ICD-10-CM | POA: Diagnosis not present

## 2017-04-30 DIAGNOSIS — Z9861 Coronary angioplasty status: Secondary | ICD-10-CM | POA: Diagnosis not present

## 2017-04-30 DIAGNOSIS — Z87891 Personal history of nicotine dependence: Secondary | ICD-10-CM | POA: Diagnosis not present

## 2017-04-30 DIAGNOSIS — E7800 Pure hypercholesterolemia, unspecified: Secondary | ICD-10-CM | POA: Diagnosis not present

## 2017-04-30 DIAGNOSIS — I252 Old myocardial infarction: Secondary | ICD-10-CM | POA: Diagnosis not present

## 2017-04-30 DIAGNOSIS — Z794 Long term (current) use of insulin: Secondary | ICD-10-CM | POA: Diagnosis not present

## 2017-04-30 DIAGNOSIS — Z7982 Long term (current) use of aspirin: Secondary | ICD-10-CM | POA: Diagnosis not present

## 2017-04-30 DIAGNOSIS — Z79891 Long term (current) use of opiate analgesic: Secondary | ICD-10-CM | POA: Diagnosis not present

## 2017-04-30 DIAGNOSIS — K8020 Calculus of gallbladder without cholecystitis without obstruction: Secondary | ICD-10-CM | POA: Diagnosis present

## 2017-04-30 HISTORY — DX: Chronic kidney disease, unspecified: N18.9

## 2017-04-30 LAB — CBC
HCT: 36.8 % — ABNORMAL LOW (ref 39.0–52.0)
Hemoglobin: 13 g/dL (ref 13.0–17.0)
MCH: 31.4 pg (ref 26.0–34.0)
MCHC: 35.3 g/dL (ref 30.0–36.0)
MCV: 88.9 fL (ref 78.0–100.0)
PLATELETS: 202 10*3/uL (ref 150–400)
RBC: 4.14 MIL/uL — ABNORMAL LOW (ref 4.22–5.81)
RDW: 12.9 % (ref 11.5–15.5)
WBC: 8.2 10*3/uL (ref 4.0–10.5)

## 2017-04-30 LAB — BASIC METABOLIC PANEL
ANION GAP: 10 (ref 5–15)
BUN: 39 mg/dL — AB (ref 6–20)
CALCIUM: 9.1 mg/dL (ref 8.9–10.3)
CO2: 19 mmol/L — ABNORMAL LOW (ref 22–32)
Chloride: 105 mmol/L (ref 101–111)
Creatinine, Ser: 2.07 mg/dL — ABNORMAL HIGH (ref 0.61–1.24)
GFR calc Af Amer: 34 mL/min — ABNORMAL LOW (ref >60)
GFR, EST NON AFRICAN AMERICAN: 29 mL/min — AB (ref >60)
Glucose, Bld: 123 mg/dL — ABNORMAL HIGH (ref 65–99)
Potassium: 4.5 mmol/L (ref 3.5–5.1)
SODIUM: 134 mmol/L — AB (ref 135–145)

## 2017-04-30 LAB — HEMOGLOBIN A1C
Hgb A1c MFr Bld: 7.7 % — ABNORMAL HIGH (ref 4.8–5.6)
Mean Plasma Glucose: 174.29 mg/dL

## 2017-04-30 LAB — GLUCOSE, CAPILLARY: Glucose-Capillary: 114 mg/dL — ABNORMAL HIGH (ref 65–99)

## 2017-04-30 NOTE — Pre-Procedure Instructions (Signed)
LYRIK BURESH  04/30/2017      [facility] - [place], [place] - [address] [place] [address] Phone: [phone] Fax: [phone]  [facility] [place], [place] - [address] [place] [address] [place] [place] [address] Phone: [phone] Fax: [phone]    Your procedure is scheduled on   Wednesday 05/02/17  Report to [place] at 630 A.M.  Call this number if you have problems the morning of surgery:  [phone]   Remember:  Do not eat food or drink liquids after midnight.  Continue all other medications as directed by your physician except follow these medication instructions before surgery   Take these medicines the morning of surgery with A SIP OF WATER    alfuzosin (uroxatral) diltiazem (tiazac) levocetirizine (xyzal)  7 days prior to surgery STOP taking any Aspirin (unless otherwise instructed by your surgeon), Aleve, Naproxen, Ibuprofen, Motrin, Advil, Goody's, BC's, all herbal medications, fish oil, and all vitamins  WHAT DO I DO ABOUT MY DIABETES MEDICATION?   Marland Kitchen Do not take oral diabetes medicines (pills) the morning of surgery.  . THE NIGHT BEFORE SURGERY, take 14 units of Novolog 70/30 insulin.       . THE MORNING OF SURGERY, Do not take any insulin   How to Manage Your Diabetes Before and After Surgery  Why is it important to control my blood sugar before and after surgery? . Improving blood sugar levels before and after surgery helps healing and can limit problems. . A way of improving blood sugar control is eating a healthy diet by: o  Eating less sugar and carbohydrates o  Increasing activity/exercise o  Talking with your doctor about reaching your blood sugar goals . High blood sugars (greater than 180 mg/dL) can raise your risk of infections and slow your recovery, so you will need to focus on  controlling your diabetes during the weeks before surgery. . Make sure that the doctor who takes care of your diabetes knows about your planned surgery including the date and location.  How do I manage my blood sugar before surgery? . Check your blood sugar at least 4 times a day, starting 2 days before surgery, to make sure that the level is not too high or low. o Check your blood sugar the morning of your surgery when you wake up and every 2 hours until you get to the Short Stay unit. . If your blood sugar is less than 70 mg/dL, you will need to treat for low blood sugar: o Do not take insulin. o Treat a low blood sugar (less than 70 mg/dL) with  cup of clear juice (cranberry or apple), 4 glucose tablets, OR glucose gel. o Recheck blood sugar in 15 minutes after treatment (to make sure it is greater than 70 mg/dL). If your blood sugar is not greater than 70 mg/dL on recheck, call [phone] for further instructions. . Report your blood sugar to the short stay nurse when you get to Short Stay.  . If you are admitted to the hospital after surgery: o Your blood sugar will be checked by the staff and you will probably be given insulin after surgery (instead of oral diabetes medicines) to make sure you have good blood sugar levels. o The goal for blood sugar control after surgery is 80-180 mg/dL.  Do not wear jewelry.  Do not wear lotions, powders, or colognes, or deodorant.  Men may shave face and neck.  Do not bring valuables to the hospital.  [facility] is not responsible for any belongings or valuables.  Contacts, eyeglasses, dentures or bridgework may not be worn into surgery.  Leave your suitcase in the car.  After surgery it may be brought to your room.  For patients admitted to the hospital, discharge time will be determined by your treatment team.  Patients discharged the day of surgery will not be allowed to drive home.   Name and phone number of your driver:    Special  instructions:  [place]- Preparing For Surgery  Before surgery, you can play an important role. Because skin is not sterile, your skin needs to be as free of germs as possible. You can reduce the number of germs on your skin by washing with CHG (chlorahexidine gluconate) Soap before surgery.  CHG is an antiseptic cleaner which kills germs and bonds with the skin to continue killing germs even after washing.  Please do not use if you have an allergy to CHG or antibacterial soaps. If your skin becomes reddened/irritated stop using the CHG.  Do not shave (including legs and underarms) for at least 48 hours prior to first CHG shower. It is OK to shave your face.  Please follow these instructions carefully.   1. Shower the NIGHT BEFORE SURGERY and the MORNING OF SURGERY with CHG.   2. If you chose to wash your hair, wash your hair first as usual with your normal shampoo.  3. After you shampoo, rinse your hair and body thoroughly to remove the shampoo.  4. Use CHG as you would any other liquid soap. You can apply CHG directly to the skin and wash gently with a scrungie or a clean washcloth.   5. Apply the CHG Soap to your body ONLY FROM THE NECK DOWN.  Do not use on open wounds or open sores. Avoid contact with your eyes, ears, mouth and genitals (private parts). Wash Face and genitals (private parts)  with your normal soap.  6. Wash thoroughly, paying special attention to the area where your surgery will be performed.  7. Thoroughly rinse your body with warm water from the neck down.  8. DO NOT shower/wash with your normal soap after using and rinsing off the CHG Soap.  9. Pat yourself dry with a CLEAN TOWEL.  10. Wear CLEAN PAJAMAS to bed the night before surgery, wear comfortable clothes the morning of surgery  11. Place CLEAN SHEETS on your bed the night of your first shower and DO NOT SLEEP WITH PETS.   Day of Surgery: Shower as stated above. Do not apply any deodorants/lotions.   Please wear clean clothes to the hospital/surgery center.      Please read over the following fact sheets that you were given.

## 2017-04-30 NOTE — Progress Notes (Signed)
PCP - Dr. Alessandra Grout at [facility] at [place] - Dr. Tollie Eth at [facility] in [place]  Chest x-ray - n/a EKG - 03/2017 - requested tracing from Dr. Thurman Coyer office Stress Test - patient states he has had this done in the past at Dr. Thurman Coyer office but not sure when - requested ECHO - patient states he has had this done in the past at Dr. Thurman Coyer office but not sure when - requested Cardiac Cath - 1990  Sleep Study - patient denies   Fasting Blood Sugar - 110-150's Checks Blood Sugar 1 time a day    Patient denies shortness of breath, fever, cough and chest pain at PAT appointment   Patient verbalized understanding of instructions that were given to them at the PAT appointment. Patient was also instructed that they will need to review over the PAT instructions again at home before surgery.

## 2017-05-01 ENCOUNTER — Encounter (HOSPITAL_COMMUNITY): Payer: Self-pay

## 2017-05-01 NOTE — Progress Notes (Signed)
Anesthesia Chart Review: Patient is a 76 year old male scheduled for laparoscopic cholecystectomy on 05/02/17 by Dr. Ralene Ok.  History include former smoker, MI s/p TPA and PTCA LAD '90, diabetes mellitus type 2, CKD stage III, hypertension, hypercholesterolemia, Bell's palsy, back surgery, tonsillectomy, right rotator cuff repair '14. BMI is consistent with obesity.  - PCP is Dr. Jonathon Jordan, last visit 04/27/17 with labs. She is aware of scheduled surgery. Of note, she noted patient's creatinine was was worse since last year. She recommended he increase his water intake, avoid NSAIDS. She also forwarded his labs to [facility] nephrology for future follow-up. - Cardiologist is Dr. Tollie Eth. Following 03/26/17 visit he wrote, "from a cardiovascular viewpoint may proceed with the plan laparoscopic cholecystectomy. No additional cardiovascular workup is necessary at this time.Marland KitchenMarland KitchenI would be happy to see if needed at the time of surgery otherwise follow-up in one year." - Endocrinologist is with the [facility]. (Dr. Molinda Bailiff) - Nephrologist (per [facility] records) is Dr. Donato Heinz with [facility] ([place]) and Dr. Raoul Pitch Churchuri with the [facility] (fax [phone]); however, [facility] does not have a patient by his name and DOB.  Meds include Uroxatral, allopurinol, aspirin 81 mg, diltiazem, finasteride, Flonase, NovoLog 70/30, Xyzal, Claritin, losartan, simvastatin, Hytrin.  BP (!) 114/54   Pulse 65   Temp 36.4 C   Resp 20   Ht 5\' 3"  (1.6 m)   Wt 183 lb 14.4 oz (83.4 kg)   SpO2 98%   BMI 32.58 kg/m   EKG 03/26/17 (Dr. Wynonia Lawman): NSR, poor r wave progression, anterior infarct (old). Dr. Wynonia Lawman felt Q wave in V2 was unchanged from before.)  Treadmill Cardiolite 10/17/12 (Dr. Wynonia Lawman): Impression: 1. Abnormal stress Myoview study with evidence of previous anterioapical infarction but no evidence of ischemia. 2. Technically difficult quantitative gated SPECT analysis  with evidence of anterior apical hypokinesis but technically difficult ejection fraction which appears to be artificially low (calculated EF 26% but visually estimated ~ 50%). 3. Good exercise capacity with no evidence no EKG evidence of ischemia. Recommendations: Continue medical therapy, evaluate echocardiogram to confirm ejection fraction.  Echo 10/30/12 (Dr. Wynonia Lawman): Conclusion: 1. Lower limits of normal left ventricle with anteroseptal and apical hypokinesis, EF 50%. 2. Mild left atrial enlargement. 3. Doppler evidence of grade 1 diastolic dysfunction.  According Dr. Thurman Coyer office note: - Cardiac cath 08/14/88: "normal Left main, 99% stenosis proximal LAD, no significant disease CFX, RCA, PTCA of LAD done."  Preoperative labs noted. BUN 39, Cr 2.07. (Labs from Dr. Stephanie Acre show his BUN 34, Cr 1.99 on 04/27/17, previously ~ 1.65-1.70 since last year per [facility] staff. Dr. Stephanie Acre recommendations discussed above.) H/H 13.0/36.8. A1c 7.7. He reported fasting CBGs 110-150's.   Patient has cardiac clearance for surgery. His renal function is a little higher since last year but this is known by his PCP who just saw him last week and is aware of surgery plans. Patient is also followed by nephrology so should have continued follow-up. If no acute changes then I anticipate that he can proceed as planned.  George Hugh [facility] Short Stay Center/Anesthesiology Phone [phone] 05/01/2017 3:23 PM

## 2017-05-01 NOTE — Anesthesia Preprocedure Evaluation (Addendum)
Anesthesia Evaluation  Patient identified by MRN, date of birth, ID band Patient awake  General Assessment Comment: Diabetes     .  Hypertension     .  Hypercholesteremia     .  Bell's palsy  09/14/2012   .  Myocardial infarction  1990     Reviewed: Allergy & Precautions, H&P , NPO status , Patient's Chart, lab work & pertinent test results  History of Anesthesia Complications Negative for: history of anesthetic complications  Airway Mallampati: III  TM Distance: <3 FB Neck ROM: Full    Dental  (+) Dental Advisory Given, Teeth Intact   Pulmonary neg sleep apnea, neg COPD, former smoker,    Pulmonary exam normal breath sounds clear to auscultation       Cardiovascular Exercise Tolerance: Good hypertension, Pt. on medications + Past MI (1990, s/p angioplasty)  Normal cardiovascular exam Rhythm:Regular Rate:Normal     Neuro/Psych Bell's palsy (right) negative psych ROS   GI/Hepatic negative GI ROS, Neg liver ROS, neg GERD  ,  Endo/Other  diabetes, Type 2, Insulin DependentObesity  Renal/GU CRFRenal disease  negative genitourinary   Musculoskeletal negative musculoskeletal ROS (+)   Abdominal   Peds  Hematology  (+) anemia ,   Anesthesia Other Findings   Reproductive/Obstetrics                            Anesthesia Physical  Anesthesia Plan  ASA: III  Anesthesia Plan: General   Post-op Pain Management:    Induction: Intravenous  PONV Risk Score and Plan: 4 or greater and Ondansetron, Dexamethasone and Treatment may vary due to age or medical condition  Airway Management Planned: Oral ETT  Additional Equipment: None  Intra-op Plan:   Post-operative Plan: Extubation in OR  Informed Consent: I have reviewed the patients History and Physical, chart, labs and discussed the procedure including the risks, benefits and alternatives for the proposed anesthesia with the patient or  authorized representative who has indicated his/her understanding and acceptance.   Dental advisory given  Plan Discussed with: CRNA  Anesthesia Plan Comments:         Anesthesia Quick Evaluation

## 2017-05-02 ENCOUNTER — Ambulatory Visit (HOSPITAL_COMMUNITY)
Admission: RE | Admit: 2017-05-02 | Discharge: 2017-05-02 | Disposition: A | Discharge status: Home / Self Care | Payer: Medicare Other | Source: Ambulatory Visit | Attending: General Surgery | Admitting: General Surgery

## 2017-05-02 ENCOUNTER — Ambulatory Visit (HOSPITAL_COMMUNITY): Payer: Medicare Other | Admitting: Vascular Surgery

## 2017-05-02 ENCOUNTER — Ambulatory Visit (HOSPITAL_COMMUNITY): Payer: Medicare Other | Admitting: Anesthesiology

## 2017-05-02 ENCOUNTER — Encounter (HOSPITAL_COMMUNITY)
Admission: RE | Disposition: A | Discharge status: Home / Self Care | Payer: Self-pay | Source: Ambulatory Visit | Attending: General Surgery

## 2017-05-02 ENCOUNTER — Encounter (HOSPITAL_COMMUNITY): Payer: Self-pay | Admitting: *Deleted

## 2017-05-02 DIAGNOSIS — K8010 Calculus of gallbladder with chronic cholecystitis without obstruction: Secondary | ICD-10-CM | POA: Insufficient documentation

## 2017-05-02 DIAGNOSIS — Z794 Long term (current) use of insulin: Secondary | ICD-10-CM | POA: Insufficient documentation

## 2017-05-02 DIAGNOSIS — Z9861 Coronary angioplasty status: Secondary | ICD-10-CM | POA: Insufficient documentation

## 2017-05-02 DIAGNOSIS — E7800 Pure hypercholesterolemia, unspecified: Secondary | ICD-10-CM | POA: Insufficient documentation

## 2017-05-02 DIAGNOSIS — I10 Essential (primary) hypertension: Secondary | ICD-10-CM | POA: Diagnosis not present

## 2017-05-02 DIAGNOSIS — Z87891 Personal history of nicotine dependence: Secondary | ICD-10-CM | POA: Insufficient documentation

## 2017-05-02 DIAGNOSIS — I252 Old myocardial infarction: Secondary | ICD-10-CM | POA: Insufficient documentation

## 2017-05-02 DIAGNOSIS — Z79899 Other long term (current) drug therapy: Secondary | ICD-10-CM | POA: Insufficient documentation

## 2017-05-02 DIAGNOSIS — E119 Type 2 diabetes mellitus without complications: Secondary | ICD-10-CM | POA: Diagnosis not present

## 2017-05-02 DIAGNOSIS — Z79891 Long term (current) use of opiate analgesic: Secondary | ICD-10-CM | POA: Insufficient documentation

## 2017-05-02 DIAGNOSIS — Z7982 Long term (current) use of aspirin: Secondary | ICD-10-CM | POA: Insufficient documentation

## 2017-05-02 HISTORY — PX: CHOLECYSTECTOMY: SHX55

## 2017-05-02 LAB — GLUCOSE, CAPILLARY
GLUCOSE-CAPILLARY: 217 mg/dL — AB (ref 65–99)
Glucose-Capillary: 158 mg/dL — ABNORMAL HIGH (ref 65–99)

## 2017-05-02 SURGERY — LAPAROSCOPIC CHOLECYSTECTOMY
Anesthesia: General | Site: Abdomen

## 2017-05-02 MED ORDER — LIDOCAINE HCL (CARDIAC) 20 MG/ML IV SOLN
INTRAVENOUS | Status: DC | PRN
  Administered 2017-05-02: 80 mg via INTRAVENOUS

## 2017-05-02 MED ORDER — ACETAMINOPHEN 325 MG PO TABS: 650.0000 mg | ORAL_TABLET | ORAL | Status: DC | PRN

## 2017-05-02 MED ORDER — FENTANYL CITRATE (PF) 250 MCG/5ML IJ SOLN
INTRAMUSCULAR | Status: AC
  Filled 2017-05-02: qty 5

## 2017-05-02 MED ORDER — CIPROFLOXACIN IN D5W 400 MG/200ML IV SOLN
400.0000 mg | Freq: Once | INTRAVENOUS | Status: AC
  Administered 2017-05-02: 400 mg via INTRAVENOUS
  Filled 2017-05-02: qty 200

## 2017-05-02 MED ORDER — FENTANYL CITRATE (PF) 100 MCG/2ML IJ SOLN
25.0000 ug | INTRAMUSCULAR | Status: DC | PRN
  Administered 2017-05-02 (×2): 50 ug via INTRAVENOUS

## 2017-05-02 MED ORDER — ONDANSETRON HCL 4 MG/2ML IJ SOLN: 4.0000 mg | Freq: Once | INTRAMUSCULAR | Status: DC | PRN

## 2017-05-02 MED ORDER — EPHEDRINE SULFATE-NACL 50-0.9 MG/10ML-% IV SOSY
PREFILLED_SYRINGE | INTRAVENOUS | Status: DC | PRN
  Administered 2017-05-02: 5 mg via INTRAVENOUS

## 2017-05-02 MED ORDER — OXYCODONE HCL 5 MG PO TABS
ORAL_TABLET | ORAL | Status: AC
  Filled 2017-05-02: qty 2

## 2017-05-02 MED ORDER — ONDANSETRON HCL 4 MG/2ML IJ SOLN
INTRAMUSCULAR | Status: DC | PRN
  Administered 2017-05-02: 4 mg via INTRAVENOUS

## 2017-05-02 MED ORDER — DEXAMETHASONE SODIUM PHOSPHATE 10 MG/ML IJ SOLN
INTRAMUSCULAR | Status: AC
  Filled 2017-05-02: qty 1

## 2017-05-02 MED ORDER — ROCURONIUM BROMIDE 10 MG/ML (PF) SYRINGE
PREFILLED_SYRINGE | INTRAVENOUS | Status: AC
  Filled 2017-05-02: qty 5

## 2017-05-02 MED ORDER — PROPOFOL 10 MG/ML IV BOLUS
INTRAVENOUS | Status: AC
  Filled 2017-05-02: qty 40

## 2017-05-02 MED ORDER — DEXAMETHASONE SODIUM PHOSPHATE 10 MG/ML IJ SOLN
INTRAMUSCULAR | Status: DC | PRN
  Administered 2017-05-02: 5 mg via INTRAVENOUS

## 2017-05-02 MED ORDER — PHENYLEPHRINE 40 MCG/ML (10ML) SYRINGE FOR IV PUSH (FOR BLOOD PRESSURE SUPPORT)
PREFILLED_SYRINGE | INTRAVENOUS | Status: AC
  Filled 2017-05-02: qty 10

## 2017-05-02 MED ORDER — OXYCODONE-ACETAMINOPHEN 5-325 MG PO TABS: 1.0000 | ORAL_TABLET | ORAL | 0 refills | Status: DC | PRN

## 2017-05-02 MED ORDER — SODIUM CHLORIDE 0.9 % IV SOLN
INTRAVENOUS | Status: DC
  Administered 2017-05-02 (×2): via INTRAVENOUS

## 2017-05-02 MED ORDER — SODIUM CHLORIDE 0.9 % IV SOLN: 250.0000 mL | INTRAVENOUS | Status: DC | PRN

## 2017-05-02 MED ORDER — PHENYLEPHRINE 40 MCG/ML (10ML) SYRINGE FOR IV PUSH (FOR BLOOD PRESSURE SUPPORT)
PREFILLED_SYRINGE | INTRAVENOUS | Status: DC | PRN
  Administered 2017-05-02 (×2): 80 ug via INTRAVENOUS

## 2017-05-02 MED ORDER — SODIUM CHLORIDE 0.9% FLUSH: 3.0000 mL | INTRAVENOUS | Status: DC | PRN

## 2017-05-02 MED ORDER — OXYCODONE HCL 5 MG PO TABS
5.0000 mg | ORAL_TABLET | ORAL | Status: DC | PRN
  Administered 2017-05-02: 10 mg via ORAL

## 2017-05-02 MED ORDER — BUPIVACAINE HCL (PF) 0.25 % IJ SOLN
INTRAMUSCULAR | Status: AC
  Filled 2017-05-02: qty 30

## 2017-05-02 MED ORDER — CHLORHEXIDINE GLUCONATE CLOTH 2 % EX PADS: 6.0000 | MEDICATED_PAD | Freq: Once | CUTANEOUS | Status: DC

## 2017-05-02 MED ORDER — MORPHINE SULFATE (PF) 2 MG/ML IV SOLN
2.0000 mg | INTRAVENOUS | Status: DC | PRN
Start: 2017-05-02 — End: 2017-05-02

## 2017-05-02 MED ORDER — SODIUM CHLORIDE 0.9% FLUSH: 3.0000 mL | Freq: Two times a day (BID) | INTRAVENOUS | Status: DC

## 2017-05-02 MED ORDER — PROPOFOL 10 MG/ML IV BOLUS
INTRAVENOUS | Status: DC | PRN
  Administered 2017-05-02: 150 mg via INTRAVENOUS

## 2017-05-02 MED ORDER — SUGAMMADEX SODIUM 200 MG/2ML IV SOLN
INTRAVENOUS | Status: DC | PRN
  Administered 2017-05-02: 200 mg via INTRAVENOUS

## 2017-05-02 MED ORDER — SUGAMMADEX SODIUM 200 MG/2ML IV SOLN
INTRAVENOUS | Status: AC
  Filled 2017-05-02: qty 2

## 2017-05-02 MED ORDER — FENTANYL CITRATE (PF) 100 MCG/2ML IJ SOLN
INTRAMUSCULAR | Status: DC | PRN
  Administered 2017-05-02: 100 ug via INTRAVENOUS
  Administered 2017-05-02: 50 ug via INTRAVENOUS

## 2017-05-02 MED ORDER — ROCURONIUM BROMIDE 100 MG/10ML IV SOLN
INTRAVENOUS | Status: DC | PRN
  Administered 2017-05-02: 50 mg via INTRAVENOUS

## 2017-05-02 MED ORDER — FENTANYL CITRATE (PF) 100 MCG/2ML IJ SOLN
INTRAMUSCULAR | Status: AC
  Filled 2017-05-02: qty 2

## 2017-05-02 MED ORDER — SODIUM CHLORIDE 0.9 % IR SOLN
Status: DC | PRN
  Administered 2017-05-02: 1000 mL

## 2017-05-02 MED ORDER — EPHEDRINE 5 MG/ML INJ
INTRAVENOUS | Status: AC
  Filled 2017-05-02: qty 10

## 2017-05-02 MED ORDER — ONDANSETRON HCL 4 MG/2ML IJ SOLN
INTRAMUSCULAR | Status: AC
  Filled 2017-05-02: qty 2

## 2017-05-02 MED ORDER — 0.9 % SODIUM CHLORIDE (POUR BTL) OPTIME
TOPICAL | Status: DC | PRN
  Administered 2017-05-02: 1000 mL

## 2017-05-02 MED ORDER — BUPIVACAINE HCL 0.25 % IJ SOLN
INTRAMUSCULAR | Status: DC | PRN
  Administered 2017-05-02: 30 mL

## 2017-05-02 MED ORDER — CEFAZOLIN SODIUM-DEXTROSE 2-4 GM/100ML-% IV SOLN
2.0000 g | INTRAVENOUS | Status: DC
  Filled 2017-05-02: qty 100

## 2017-05-02 MED ORDER — LIDOCAINE 2% (20 MG/ML) 5 ML SYRINGE
INTRAMUSCULAR | Status: AC
  Filled 2017-05-02: qty 5

## 2017-05-02 MED ORDER — ACETAMINOPHEN 650 MG RE SUPP: 650.0000 mg | RECTAL | Status: DC | PRN

## 2017-05-02 SURGICAL SUPPLY — 45 items
APL SKNCLS STERI-STRIP NONHPOA (GAUZE/BANDAGES/DRESSINGS) ×1
BAG SPEC RTRVL 10 TROC 200 (ENDOMECHANICALS)
BENZOIN TINCTURE PRP APPL 2/3 (GAUZE/BANDAGES/DRESSINGS) ×3 IMPLANT
CANISTER SUCT 3000ML PPV (MISCELLANEOUS) ×3 IMPLANT
CHLORAPREP W/TINT 26ML (MISCELLANEOUS) ×3 IMPLANT
CLIP VESOLOCK MED LG 6/CT (CLIP) ×5 IMPLANT
CLOSURE WOUND 1/2 X4 (GAUZE/BANDAGES/DRESSINGS) ×1
COVER SURGICAL LIGHT HANDLE (MISCELLANEOUS) ×3 IMPLANT
COVER TRANSDUCER ULTRASND (DRAPES) ×3 IMPLANT
ELECT REM PT RETURN 9FT ADLT (ELECTROSURGICAL) ×3
ELECTRODE REM PT RTRN 9FT ADLT (ELECTROSURGICAL) ×1 IMPLANT
GAUZE SPONGE 2X2 8PLY STRL LF (GAUZE/BANDAGES/DRESSINGS) ×1 IMPLANT
GLOVE BIO SURGEON STRL SZ7.5 (GLOVE) ×3 IMPLANT
GLOVE BIOGEL PI IND STRL 6.5 (GLOVE) IMPLANT
GLOVE BIOGEL PI INDICATOR 6.5 (GLOVE) ×2
GLOVE ECLIPSE 7.5 STRL STRAW (GLOVE) ×2 IMPLANT
GLOVE INDICATOR 7.5 STRL GRN (GLOVE) ×2 IMPLANT
GLOVE SURG SS PI 6.0 STRL IVOR (GLOVE) ×2 IMPLANT
GOWN STRL REUS W/ TWL LRG LVL3 (GOWN DISPOSABLE) ×2 IMPLANT
GOWN STRL REUS W/ TWL XL LVL3 (GOWN DISPOSABLE) ×1 IMPLANT
GOWN STRL REUS W/TWL LRG LVL3 (GOWN DISPOSABLE) ×6
GOWN STRL REUS W/TWL XL LVL3 (GOWN DISPOSABLE) ×3
GRASPER SUT TROCAR 14GX15 (MISCELLANEOUS) ×3 IMPLANT
KIT BASIN OR (CUSTOM PROCEDURE TRAY) ×3 IMPLANT
KIT ROOM TURNOVER OR (KITS) ×3 IMPLANT
NDL INSUFFLATION 14GA 120MM (NEEDLE) ×1 IMPLANT
NEEDLE INSUFFLATION 14GA 120MM (NEEDLE) ×3 IMPLANT
NS IRRIG 1000ML POUR BTL (IV SOLUTION) ×3 IMPLANT
PAD ARMBOARD 7.5X6 YLW CONV (MISCELLANEOUS) ×6 IMPLANT
POUCH RETRIEVAL ECOSAC 10 (ENDOMECHANICALS) IMPLANT
POUCH RETRIEVAL ECOSAC 10MM (ENDOMECHANICALS)
SCISSORS LAP 5X35 DISP (ENDOMECHANICALS) ×3 IMPLANT
SET IRRIG TUBING LAPAROSCOPIC (IRRIGATION / IRRIGATOR) ×3 IMPLANT
SLEEVE ENDOPATH XCEL 5M (ENDOMECHANICALS) ×5 IMPLANT
SPECIMEN JAR SMALL (MISCELLANEOUS) ×3 IMPLANT
SPONGE GAUZE 2X2 STER 10/PKG (GAUZE/BANDAGES/DRESSINGS) ×2
STRIP CLOSURE SKIN 1/2X4 (GAUZE/BANDAGES/DRESSINGS) ×2 IMPLANT
SUT MNCRL AB 3-0 PS2 18 (SUTURE) ×3 IMPLANT
TAPE CLOTH SURG 4X10 WHT LF (GAUZE/BANDAGES/DRESSINGS) ×2 IMPLANT
TOWEL OR 17X24 6PK STRL BLUE (TOWEL DISPOSABLE) ×3 IMPLANT
TOWEL OR 17X26 10 PK STRL BLUE (TOWEL DISPOSABLE) ×3 IMPLANT
TRAY LAPAROSCOPIC MC (CUSTOM PROCEDURE TRAY) ×3 IMPLANT
TROCAR XCEL NON-BLD 11X100MML (ENDOMECHANICALS) ×3 IMPLANT
TROCAR XCEL NON-BLD 5MMX100MML (ENDOMECHANICALS) ×3 IMPLANT
TUBING INSUFFLATION (TUBING) ×3 IMPLANT

## 2017-05-02 NOTE — Transfer of Care (Signed)
Immediate Anesthesia Transfer of Care Note  Patient: Robert Lucas  Procedure(s) Performed: LAPAROSCOPIC CHOLECYSTECTOMY (N/A Abdomen)  Patient Location: PACU  Anesthesia Type:General  Level of Consciousness: awake, alert  and oriented  Airway & Oxygen Therapy: Patient Spontanous Breathing and Patient connected to nasal cannula oxygen  Post-op Assessment: Report given to RN and Post -op Vital signs reviewed and stable  Post vital signs: Reviewed and stable  Last Vitals:  Vitals:   05/02/17 0655  BP: (!) 149/66  Pulse: 64  Resp: 20  Temp: 36.7 C  SpO2: 98%    Last Pain:  Vitals:   05/02/17 0655  TempSrc: Oral  PainSc:       Patients Stated Pain Goal: 5 (31/74/09 9278)  Complications: No apparent anesthesia complications

## 2017-05-02 NOTE — Anesthesia Postprocedure Evaluation (Signed)
Anesthesia Post Note  Patient: Robert Lucas  Procedure(s) Performed: LAPAROSCOPIC CHOLECYSTECTOMY (N/A Abdomen)     Patient location during evaluation: PACU Anesthesia Type: General Level of consciousness: awake and alert Pain management: pain level controlled Vital Signs Assessment: post-procedure vital signs reviewed and stable Respiratory status: spontaneous breathing, nonlabored ventilation, respiratory function stable and patient connected to nasal cannula oxygen Cardiovascular status: blood pressure returned to baseline and stable Postop Assessment: no apparent nausea or vomiting Anesthetic complications: no    Last Vitals:  Vitals:   05/02/17 1046 05/02/17 1048  BP:  103/60  Pulse:  (!) 57  Resp:  12  Temp: 36.5 C   SpO2:  95%    Last Pain:  Vitals:   05/02/17 1025  TempSrc:   PainSc: [place]

## 2017-05-02 NOTE — Op Note (Signed)
05/02/2017  9:35 AM  PATIENT:  Robert Lucas  76 y.o. male  PRE-OPERATIVE DIAGNOSIS:  gallstones  POST-OPERATIVE DIAGNOSIS:  gallstones  PROCEDURE:  Procedure(s): LAPAROSCOPIC CHOLECYSTECTOMY (N/A)  SURGEON:  Surgeon(s) and Role:    * Ralene Ok, MD - Primary  ANESTHESIA:   local and general  EBL:  minimal   BLOOD ADMINISTERED:none  DRAINS: none   LOCAL MEDICATIONS USED:  BUPIVICAINE   SPECIMEN:  Source of Specimen:  gallbladder  DISPOSITION OF SPECIMEN:  PATHOLOGY  COUNTS:  YES  TOURNIQUET:  * No tourniquets in log *  DICTATION: .Dragon Dictation The patient was taken to the operating and placed in the supine position with bilateral SCDs in place. The patient was prepped and draped in the usual sterile fashion. A time out was called and all facts were verified. A pneumoperitoneum was obtained via A Veress needle technique to a pressure of 61mm of mercury.  A 78mm trochar was then placed in the right upper quadrant under visualization, and there were no injuries to any abdominal organs. A 11 mm port was then placed in the umbilical region after infiltrating with local anesthesia under direct visualization. A second and third epigastric port and right lower quadrant port placement under direct visualization, respectively. The liver was very fatty and large. The gallbladder was identified and retracted, the peritoneum was then sharply dissected from the gallbladder and this dissection was carried down to Calot's triangle. The gallbladder was identified and stripped away circumferentially and seen going into the gallbladder 360, the critical angle was obtained.  2 clips were placed proximally one distally and the cystic duct transected. The cystic artery was identified and 2 clips placed proximally and one distally and transected. We then proceeded to remove the gallbladder off the hepatic fossa with Bovie cautery. A retrieval bag was then placed in the abdomen and  gallbladder placed in the bag. The hepatic fossa was then reexamined and hemostasis was achieved with Bovie cautery and was excellent at the end of the case. The subhepatic fossa and perihepatic fossa was then irrigated until the effluent was clear.  The gallbladder and bag were removed from the abdominal cavity. The 11 mm trocar fascia was reapproximated with the Endo Close #1 Vicryl x3. The pneumoperitoneum was evacuated and all trochars removed under direct visulalization. The skin was then closed with 4-0 Monocryl and the skin dressed with Steri-Strips, gauze, and tape. The patient was awaken from general anesthesia and taken to the recovery room in stable condition.    PLAN OF CARE: Discharge to home after PACU  PATIENT DISPOSITION:  PACU - hemodynamically stable.   Delay start of Pharmacological VTE agent (>24hrs) due to surgical blood loss or risk of bleeding: not applicable

## 2017-05-02 NOTE — H&P (Signed)
History of Present Illness The patient is a 76 year old male who presents for evaluation of gall stones.  Chief Complaint: Right upper quadrant abdominal pain Patient is a 76 year old male, with a history of MI in the past, diabetes, hypertension, who comes in with a 7 month history of right-sided abdominal pain. Patient had extensive workup to include CT scan, MRI to evaluate his pain. Patient did appear to have cholelithiasis. There is no other significant findings.  Patient states that the pain is right-sided. He states it does not change with any meals, high fatty foods, spicy foods. He states blunt nature. He has no nausea or vomiting associated with this pain.  Patient sees Dr. Wynonia Lawman as his cardiologist.   Medication History  Terazosin HCl (10MG  Capsule, Oral) Active. Simvastatin (80MG  Tablet, Oral) Active. Aspirin (81MG  Tablet Chewable, Oral) Active. Simvastatin (20MG  Tablet, Oral) Active. TraMADol HCl (50MG  Tablet, Oral) Active. Accu-Chek Aviva Plus (In Vitro) Active. Allopurinol (100MG  Tablet, Oral) Active. Finasteride (5MG  Tablet, Oral) Active. Levocetirizine Dihydrochloride (5MG  Tablet, Oral) Active. Losartan Potassium (100MG  Tablet, Oral) Active. Terazosin HCl (2MG  Capsule, Oral) Active. NovoLOG Mix 70/30 ((70-30) 100UNIT/ML Suspension, Subcutaneous) Active. Vitamin D (2000UNIT Capsule, Oral) Active. Medications Reconciled    Review of Systems General Present- Feeling well. Not Present- Fever. Respiratory Not Present- Cough and Difficulty Breathing. Cardiovascular Not Present- Chest Pain. Gastrointestinal Present- Abdominal Pain and Nausea. Musculoskeletal Not Present- Myalgia. Neurological Not Present- Weakness. All other systems negative  BP (!) 149/66   Pulse 64   Temp 98 F (36.7 C) (Oral)   Resp 20   Ht 5\' 3"  (1.6 m)   Wt 83 kg (183 lb)   SpO2 98%   BMI 32.42 kg/m     Physical Exam  The physical exam findings are  as follows: Note:Constitutional: No acute distress, conversant, appears stated age  Eyes: Anicteric sclerae, moist conjunctiva, no lid lag  Neck: No thyromegaly, trachea midline, no cervical lymphadenopathy  Lungs: Clear to auscultation biilaterally, normal respiratory effot  Cardiovascular: regular rate & rhythm, no murmurs, no peripheal edema, pedal pulses 2+  GI: Soft, no masses or hepatosplenomegaly, non-tender to palpation  MSK: Normal gait, no clubbing cyanosis, edema  Skin: No rashes, palpation reveals normal skin turgor  Psychiatric: Appropriate judgment and insight, oriented to person, place, and time    Assessment & Plan SYMPTOMATIC CHOLELITHIASIS (K80.20) Impression: 76 year old male with synthetic cholelithiasis  1. We will proceed to the operating room for a laparoscopic cholecystectomy  . Risks and benefits were discussed with the patient to generally include, but not limited to: infection, bleeding, possible need for post op ERCP, damage to the bile ducts, bile leak, and possible need for further surgery. Alternatives were offered and described. All questions were answered and the patient voiced understanding of the procedure and wishes to proceed at this point with a laparoscopic cholecystectomy

## 2017-05-02 NOTE — Anesthesia Procedure Notes (Signed)
Procedure Name: Intubation Date/Time: 05/02/2017 8:37 AM Performed by: Candis Shine Pre-anesthesia Checklist: Patient identified, Emergency Drugs available, Suction available and Patient being monitored Patient Re-evaluated:Patient Re-evaluated prior to induction Oxygen Delivery Method: Circle System Utilized Preoxygenation: Pre-oxygenation with 100% oxygen Induction Type: IV induction Ventilation: Mask ventilation without difficulty Laryngoscope Size: Mac and 3 Grade View: Grade II Tube type: Oral Tube size: 7.5 mm Number of attempts: 1 Airway Equipment and Method: Stylet Placement Confirmation: ETT inserted through vocal cords under direct vision,  positive ETCO2 and breath sounds checked- equal and bilateral Secured at: 22 cm Tube secured with: Tape Dental Injury: Teeth and Oropharynx as per pre-operative assessment

## 2017-05-02 NOTE — Discharge Instructions (Signed)
[facility] ______[facility] °LAPAROSCOPIC SURGERY: POST OP INSTRUCTIONS °Always review your discharge instruction sheet given to you by the facility where your surgery was performed. °IF YOU HAVE DISABILITY OR FAMILY LEAVE FORMS, YOU MUST BRING THEM TO THE OFFICE FOR PROCESSING.   °DO NOT GIVE THEM TO YOUR DOCTOR. ° °1. A prescription for pain medication may be given to you upon discharge.  Take your pain medication as prescribed, if needed.  If narcotic pain medicine is not needed, then you may take acetaminophen (Tylenol) or ibuprofen (Advil) as needed. °2. Take your usually prescribed medications unless otherwise directed. °3. If you need a refill on your pain medication, please contact your pharmacy.  They will contact our office to request authorization. Prescriptions will not be filled after 5pm or on week-ends. °4. You should follow a light diet the first few days after arrival home, such as soup and crackers, etc.  Be sure to include lots of fluids daily. °5. Most patients will experience some swelling and bruising in the area of the incisions.  Ice packs will help.  Swelling and bruising can take several days to resolve.  °6. It is common to experience some constipation if taking pain medication after surgery.  Increasing fluid intake and taking a stool softener (such as Colace) will usually help or prevent this problem from occurring.  A mild laxative (Milk of Magnesia or Miralax) should be taken according to package instructions if there are no bowel movements after 48 hours. °7. Unless discharge instructions indicate otherwise, you may remove your bandages 24-48 hours after surgery, and you may shower at that time.  You may have steri-strips (small skin tapes) in place directly over the incision.  These strips should be left on the skin for 7-10 days.  If your surgeon used skin glue on the incision, you may shower in 24 hours.  The glue will flake off over the next 2-3 weeks.  Any sutures or  staples will be removed at the office during your follow-up visit. °8. ACTIVITIES:  You may resume regular (light) daily activities beginning the next day--such as daily self-care, walking, climbing stairs--gradually increasing activities as tolerated.  You may have sexual intercourse when it is comfortable.  Refrain from any heavy lifting or straining until approved by your doctor. °a. You may drive when you are no longer taking prescription pain medication, you can comfortably wear a seatbelt, and you can safely maneuver your car and apply brakes. °b. RETURN TO WORK:  __________________________________________________________ °9. You should see your doctor in the office for a follow-up appointment approximately 2-3 weeks after your surgery.  Make sure that you call for this appointment within a day or two after you arrive home to insure a convenient appointment time. °10. OTHER INSTRUCTIONS: __________________________________________________________________________________________________________________________ __________________________________________________________________________________________________________________________ °WHEN TO CALL YOUR DOCTOR: °1. Fever over 101.0 °2. Inability to urinate °3. Continued bleeding from incision. °4. Increased pain, redness, or drainage from the incision. °5. Increasing abdominal pain ° °The clinic staff is available to answer your questions during regular business hours.  Please don’t hesitate to call and ask to speak to one of the nurses for clinical concerns.  If you have a medical emergency, go to the nearest emergency room or call 911.  A surgeon from [facility] is always on call at the hospital. °[address], [address], [place], [place]  [address] ? [address] [address], [place], [place]   [address] °[phone] ? [phone] ? FAX [phone] °Web site:   [email or web address] °

## 2017-05-03 ENCOUNTER — Encounter (HOSPITAL_COMMUNITY): Payer: Self-pay | Admitting: General Surgery

## 2017-11-15 ENCOUNTER — Emergency Department (HOSPITAL_COMMUNITY)
Admission: EM | Admit: 2017-11-15 | Discharge: 2017-11-15 | Disposition: A | Discharge status: Home / Self Care | Payer: Medicare Other | Attending: Emergency Medicine | Admitting: Emergency Medicine

## 2017-11-15 ENCOUNTER — Other Ambulatory Visit: Payer: Self-pay

## 2017-11-15 ENCOUNTER — Emergency Department (HOSPITAL_COMMUNITY): Payer: Medicare Other

## 2017-11-15 ENCOUNTER — Encounter (HOSPITAL_COMMUNITY): Payer: Self-pay | Admitting: Emergency Medicine

## 2017-11-15 DIAGNOSIS — R1012 Left upper quadrant pain: Secondary | ICD-10-CM | POA: Insufficient documentation

## 2017-11-15 DIAGNOSIS — Z7982 Long term (current) use of aspirin: Secondary | ICD-10-CM | POA: Insufficient documentation

## 2017-11-15 DIAGNOSIS — E119 Type 2 diabetes mellitus without complications: Secondary | ICD-10-CM | POA: Insufficient documentation

## 2017-11-15 DIAGNOSIS — N189 Chronic kidney disease, unspecified: Secondary | ICD-10-CM | POA: Insufficient documentation

## 2017-11-15 DIAGNOSIS — Z79899 Other long term (current) drug therapy: Secondary | ICD-10-CM | POA: Insufficient documentation

## 2017-11-15 DIAGNOSIS — I129 Hypertensive chronic kidney disease with stage 1 through stage 4 chronic kidney disease, or unspecified chronic kidney disease: Secondary | ICD-10-CM | POA: Insufficient documentation

## 2017-11-15 DIAGNOSIS — Z87891 Personal history of nicotine dependence: Secondary | ICD-10-CM | POA: Insufficient documentation

## 2017-11-15 DIAGNOSIS — K5900 Constipation, unspecified: Secondary | ICD-10-CM | POA: Diagnosis not present

## 2017-11-15 LAB — BASIC METABOLIC PANEL
Anion gap: 9 (ref 5–15)
BUN: 29 mg/dL — ABNORMAL HIGH (ref 6–20)
CO2: 26 mmol/L (ref 22–32)
Calcium: 9.3 mg/dL (ref 8.9–10.3)
Chloride: 101 mmol/L (ref 101–111)
Creatinine, Ser: 2.04 mg/dL — ABNORMAL HIGH (ref 0.61–1.24)
GFR calc Af Amer: 34 mL/min — ABNORMAL LOW (ref >60)
GFR calc non Af Amer: 30 mL/min — ABNORMAL LOW (ref >60)
Glucose, Bld: 196 mg/dL — ABNORMAL HIGH (ref 65–99)
Potassium: 5.1 mmol/L (ref 3.5–5.1)
Sodium: 136 mmol/L (ref 135–145)

## 2017-11-15 LAB — URINALYSIS, ROUTINE W REFLEX MICROSCOPIC
BACTERIA UA: NONE SEEN
Bilirubin Urine: NEGATIVE
Glucose, UA: 50 mg/dL — AB
Ketones, ur: NEGATIVE mg/dL
Leukocytes, UA: NEGATIVE
NITRITE: NEGATIVE
PROTEIN: 100 mg/dL — AB
SPECIFIC GRAVITY, URINE: 1.004 — AB (ref 1.005–1.030)
pH: 6 (ref 5.0–8.0)

## 2017-11-15 LAB — CBC
HEMATOCRIT: 37.6 % — AB (ref 39.0–52.0)
HEMOGLOBIN: 13 g/dL (ref 13.0–17.0)
MCH: 31.1 pg (ref 26.0–34.0)
MCHC: 34.6 g/dL (ref 30.0–36.0)
MCV: 90 fL (ref 78.0–100.0)
Platelets: 220 10*3/uL (ref 150–400)
RBC: 4.18 MIL/uL — AB (ref 4.22–5.81)
RDW: 13.9 % (ref 11.5–15.5)
WBC: 7.7 10*3/uL (ref 4.0–10.5)

## 2017-11-15 LAB — I-STAT TROPONIN, ED
Troponin i, poc: 0.02 ng/mL (ref 0.00–0.08)
Troponin i, poc: 0.02 ng/mL (ref 0.00–0.08)

## 2017-11-15 LAB — HEPATIC FUNCTION PANEL
ALBUMIN: 3.6 g/dL (ref 3.5–5.0)
ALT: 19 U/L (ref 17–63)
AST: 22 U/L (ref 15–41)
Alkaline Phosphatase: 77 U/L (ref 38–126)
TOTAL PROTEIN: 6.8 g/dL (ref 6.5–8.1)
Total Bilirubin: 0.6 mg/dL (ref 0.3–1.2)

## 2017-11-15 LAB — LIPASE, BLOOD: LIPASE: 32 U/L (ref 11–51)

## 2017-11-15 MED ORDER — ALUM & MAG HYDROXIDE-SIMETH 200-200-20 MG/5ML PO SUSP
30.0000 mL | Freq: Once | ORAL | Status: AC
  Administered 2017-11-15: 30 mL via ORAL
  Filled 2017-11-15: qty 30

## 2017-11-15 MED ORDER — SIMETHICONE 40 MG/0.6ML PO SUSP (UNIT DOSE)
40.0000 mg | Freq: Once | ORAL | Status: AC
  Administered 2017-11-15: 40 mg via ORAL
  Filled 2017-11-15: qty 0.6

## 2017-11-15 NOTE — ED Triage Notes (Signed)
Pt c/o LUQ pain that radiates to his chest that started last night. Denies associated symptoms. Hx MI and cholecystectomy.

## 2017-11-15 NOTE — Discharge Instructions (Signed)
You can take miralax and colace, available over the counter for constipation.  You can take tylenol (acetaminophen) as needed for pain.

## 2017-11-15 NOTE — ED Provider Notes (Signed)
Patient placed in Quick Look pathway, seen and evaluated   Chief Complaint: Chest pain / abdominal pain.  HPI:   ERCEL PEPITONE is a 77 y.o. male  with a PMH of HTN, HLD who presents to the Emergency Department complaining of left upper abdominal pain radiating up to chest. Sxs began last night. Reports hx of prior MI in the 1990's which started with a feeling of indigestion somewhat similar to current symptoms.   ROS: + chest pain, abdominal pain; - shortness of breath, fever  Physical Exam:   Gen: No distress  Neuro: Awake and Alert  Skin: Warm    Focused Exam: Mild tenderness to LUQ without rebound or guarding.    Initiation of care has begun. The patient has been counseled on the process, plan, and necessity for staying for the completion/evaluation, and the remainder of the medical screening examination.   Given LUQ abdominal pain, will add lipase and hepatic function panel to chest pain work up.     Ward, Ozella Almond, PA-C 11/15/17 1735    Rex Kras Wenda Overland, MD 11/16/17 1256

## 2017-11-21 NOTE — ED Provider Notes (Signed)
[facility] EMERGENCY DEPARTMENT Provider Note   CSN: [id number] Arrival date & time: 11/15/17  1711     History   Chief Complaint Chief Complaint  Patient presents with  . Chest Pain    HPI Robert Lucas is a 77 y.o. male.  The history is provided by the patient. No language interpreter was used.  Chest Pain      Robert Lucas is a 77 y.o. male who presents to the Emergency Department complaining of chest pain/abdominal pain.  He reports LUQ abdominal pain for the last 1-2 days, now radiating into his chest.  Hx/o prior MI but with different sxs at that time.  No nausea, vomiting, diarrhea, fever, cough, sob.  Sxs are moderate, constant in nature.    Past Medical History:  Diagnosis Date  . Bell's palsy 09/14/2012  . CKD (chronic kidney disease)   . Diabetes ([place])   . Hypercholesteremia   . Hypertension   . Myocardial infarction [facility]) 1990    Patient Active Problem List   Diagnosis Date Noted  . Complete tear of rotator cuff 04/23/2013  . Chest pain 10/13/2012  . Diabetes mellitus ([place]) 10/13/2012  . Hypertension 10/13/2012  . Hyperlipidemia 10/13/2012  . Gout 10/13/2012  . Bell's palsy 10/13/2012  . Weakness 10/13/2012    Past Surgical History:  Procedure Laterality Date  . BACK SURGERY  yrs ago   lower  . CARDIAC CATHETERIZATION    . CHOLECYSTECTOMY N/A 05/02/2017   Procedure: LAPAROSCOPIC CHOLECYSTECTOMY;  Surgeon: Ralene Ok, MD;  Location: [facility];  Service: General;  Laterality: N/A;  . KNEE SURGERY Left yrs ago  . ROTATOR CUFF REPAIR Left yrs ago  . SHOULDER OPEN ROTATOR CUFF REPAIR Right 04/23/2013   Procedure: RIGHT SHOULDER ROTATOR CUFF REPAIR WITH GRAFT AND ANCHORS ;  Surgeon: Tobi Bastos, MD;  Location: [facility];  Service: Orthopedics;  Laterality: Right;  . TONSILLECTOMY          Home Medications    Prior to Admission medications   Medication Sig Start Date End Date Taking? Authorizing Provider    acetaminophen (TYLENOL) 325 MG tablet Take 325-650 mg by mouth every 6 (six) hours as needed (for pain or headaches).   Yes [provider]  allopurinol (ZYLOPRIM) 100 MG tablet Take 100 mg by mouth every evening.    Yes [provider]  aspirin EC 81 MG tablet Take 81 mg by mouth every evening.    Yes [provider]  Cholecalciferol (VITAMIN D3 PO) Take 2,000 Units by mouth daily.    Yes [provider]  diltiazem (TIAZAC) 420 MG 24 hr capsule Take 420 mg by mouth daily.   Yes [provider]  finasteride (PROSCAR) 5 MG tablet Take 5 mg by mouth every evening.    Yes [provider]  fluticasone (FLONASE) 50 MCG/ACT nasal spray Place 1 spray into both nostrils daily as needed for allergies or rhinitis.   Yes [provider]  insulin aspart protamine- aspart (NOVOLOG MIX 70/30) (70-30) 100 UNIT/ML injection Inject 20 Units into the skin 2 (two) times daily before a meal.    Yes [provider]  levocetirizine (XYZAL) 5 MG tablet Take 5 mg by mouth daily.   Yes [provider]  losartan (COZAAR) 100 MG tablet Take 100 mg by mouth every evening.    Yes [provider]  simvastatin (ZOCOR) 80 MG tablet Take 80 mg by mouth at bedtime.   Yes [provider]  terazosin (HYTRIN) 10 MG capsule Take 10 mg by mouth at bedtime.    Yes [provider]  UNKNOWN TO PATIENT Unnamed shingles vaccine: One initial injection, then a second injection 8 weeks later   Yes [provider]  oxyCODONE-acetaminophen (ROXICET) 5-325 MG tablet Take 1 tablet by mouth every 4 (four) hours as needed for severe pain. Patient not taking: Reported on 11/15/2017 05/02/17   Ralene Ok, MD    Family History Family History  Problem Relation Age of Onset  . Dementia Mother   . Stroke Brother     Social History Social History   Tobacco Use  . Smoking status: Former Smoker    Types: Cigars    Last  attempt to quit: 07/03/1988    Years since quitting: 29.4  . Smokeless tobacco: Never Used  Substance Use Topics  . Alcohol use: Yes    Comment: occasional beer  . Drug use: No     Allergies   Lisinopril; Prednisone; and Penicillins   Review of Systems Review of Systems  Cardiovascular: Positive for chest pain.  All other systems reviewed and are negative.    Physical Exam Updated Vital Signs BP (!) 168/72   Pulse 65   Temp 98.3 F (36.8 C) (Oral)   Resp 17   SpO2 97%   Physical Exam  Constitutional: He is oriented to person, place, and time. He appears well-developed and well-nourished.  HENT:  Head: Normocephalic and atraumatic.  Cardiovascular: Normal rate and regular rhythm.  No murmur heard. Pulmonary/Chest: Effort normal and breath sounds normal. No respiratory distress.  Abdominal: Soft. There is no rebound and no guarding.  Mild to moderate LUQ tenderness.    Musculoskeletal: He exhibits no edema or tenderness.  Neurological: He is alert and oriented to person, place, and time.  Skin: Skin is warm and dry.  Psychiatric: He has a normal mood and affect. His behavior is normal.  Nursing note and vitals reviewed.    ED Treatments / Results  Labs (all labs ordered are listed, but only abnormal results are displayed) Labs Reviewed  BASIC METABOLIC PANEL - Abnormal; Notable for the following components:      Result Value   Glucose, Bld 196 (*)    BUN 29 (*)    Creatinine, Ser 2.04 (*)    GFR calc non Af Amer 30 (*)    GFR calc Af Amer 34 (*)    All other components within normal limits  CBC - Abnormal; Notable for the following components:   RBC 4.18 (*)    HCT 37.6 (*)    All other components within normal limits  HEPATIC FUNCTION PANEL - Abnormal; Notable for the following components:   Bilirubin, Direct <0.1 (*)    All other components within normal limits  URINALYSIS, ROUTINE W REFLEX MICROSCOPIC - Abnormal; Notable for the following components:    Color, Urine STRAW (*)    Specific Gravity, Urine 1.004 (*)    Glucose, UA 50 (*)    Hgb urine dipstick SMALL (*)    Protein, ur 100 (*)    All other components within normal limits  LIPASE, BLOOD  I-STAT TROPONIN, ED  I-STAT TROPONIN, ED    EKG EKG Interpretation  Date/Time:  Thursday Nov 15 2017 17:18:06 EDT Ventricular Rate:  66 PR Interval:  148 QRS Duration: 74 QT Interval:  420 QTC Calculation: 440 R Axis:   80 Text Interpretation:  Normal sinus rhythm Anteroseptal infarct , age undetermined  Abnormal ECG Confirmed by Quintella Reichert [phone]) on 11/15/2017 7:21:05 PM Also confirmed by Quintella Reichert [phone]), editor Hattie Perch ([id number])  on 11/16/2017 7:57:43 AM   Radiology No results found.  Procedures Procedures (including critical care time)  Medications Ordered in ED Medications  simethicone (MYLICON) 40 ZH/0.8MV suspension 40 mg (40 mg Oral Given 11/15/17 2040)  alum & mag hydroxide-simeth (MAALOX/MYLANTA) 200-200-20 MG/5ML suspension 30 mL (30 mLs Oral Given 11/15/17 2030)     Initial Impression / Assessment and Plan / ED Course  I have reviewed the triage vital signs and the nursing notes.  Pertinent labs & imaging results that were available during my care of the patient were reviewed by me and considered in my medical decision making (see chart for details).     Pt here for evaluation of abdominal pain/chest pain.  EKG without acute ischemic changes.  He does have abdominal tenderness on exam - CT abdomen obtained with evidence of constipation, otherwise no acute abnormalities.  Presentation is not c/w ACS, PE, dissection. Counseled patient on home care for constipation, abdominal pain.  Discussed outpatient follow up and return precautions.    Final Clinical Impressions(s) / ED Diagnoses   Final diagnoses:  Constipation, unspecified constipation type  LUQ pain    ED Discharge Orders    None       Quintella Reichert, MD 11/21/17 1242

## 2018-03-26 ENCOUNTER — Encounter: Payer: Self-pay | Admitting: Cardiology

## 2018-03-26 NOTE — Progress Notes (Signed)
Robert Lucas  Date of visit:  03/26/2018 DOB:  January 24, 1941    Age:  77 yrs. Medical record number:  [id number]     Account number:  [id number] Primary Care Provider: Jonathon Jordan ____________________________ CURRENT DIAGNOSES  1. CAD Native without angina  2. Hyperlipidemia  3. Hypertensive heart disease without heart failure  4. Chronic kidney disease, stage 3 (moderate)  5. Type 2 diabetes mellitus with diabetic nephropathy  6. Coronary angioplasty status  7. Old myocardial infarction ____________________________ ALLERGIES  Ace Inhibitors, Dry cough  Penicillins, Intolerance-unknown  Victoza, Intolerance-unknown ____________________________ MEDICATIONS  1. allopurinol 100 mg tablet, 1 p.o. daily  2. aspirin 81 mg tablet,chewable, 1 p.o. daily  3. diltiazem ER 420 mg tablet,extended release 24 hr, 1 p.o. daily  4. docusate sodium 100 mg tablet, PRN  5. finasteride 5 mg tablet, 1 p.o. daily  6. fluticasone 50 mcg/actuation nasal spray,suspension, 1 Spray each nostril q.d. PRN  7. levocetirizine 5 mg tablet, 1 p.o. daily  8. loratadine 10 mg tablet, PRN  9. losartan 100 mg tablet, 1 p.o. daily  10. Novolog Mix 70-30 100 unit/mL subcutaneous solution, Take as directed  11. simvastatin 80 mg tablet, 1 p.o. daily  12. terazosin 10 mg capsule, QHS  13. tramadol 50 mg tablet, PRN  14. Tylenol Extra Strength 500 mg tablet, PRN  15. Vitamin D3 2,000 unit tablet, 1 p.o. daily ____________________________ CHIEF COMPLAINTS  Followup of CAD Native without angina  Followup of Chronic kidney disease, stage 3 (moderate)  Followup of Hyperlipidemia  Followup of Hypertensive heart disease without heart failure ____________________________ HISTORY OF PRESENT ILLNESS Patient returns for cardiac followup. He has had a good year since he was here and has been doing well with no angina. He tolerated his surgery fine last year. He denies angina and has no PND orthopnea, syncope, or  claudication. Labs reviewed show excellent control of lipids. Has not been able to lose any significant weight.  ____________________________ PAST HISTORY  Past Medical Illnesses:  hypertension, hyperlipidemia, obesity, history of gout, sleep apnea, DM-non-insulin dependent, renal insufficiency, BPH, lumbar disc disease, Bells Palsy;  Cardiovascular Illnesses:  CAD, S/P MI-anterior 1990 treated with TPA and PTCA;  Surgical Procedures:  carpal tunnel release, tonsillectomy/adenoids 1999, shoulder repair-left, laminectomy lumbar, cholecystectomy (lap);  NYHA Classification:  I;  Canadian Angina Classification:  Class 0: Asymptomatic;  Cardiology Procedures-Invasive:  cardiac cath (left) 1990, PTCA of the LAD 1990;  Cardiology Procedures-Noninvasive:  treadmill cardiolite  June 2007, lexiscan cardiolite June 2011, treadmill cardiolite April 2014, echocardiogram April 2014;  Cardiac Cath Results:  normal Left main, 99% stenosis proximal LAD, no significant disease CFX RCA, PTCA of LAD done;  LVEF of 50% documented via echocardiogram on 10/30/2012,   ____________________________ CARDIO-PULMONARY TEST DATES EKG Date:  03/26/2017;   Cardiac Cath Date:  08/14/1988;  CABG: 08/30/1988;  Nuclear Study Date:  10/17/2012;  Echocardiography Date: 10/30/2012;   ____________________________ FAMILY HISTORY Brother -- Brother alive and well Brother -- Brother alive and well Father -- Father dead Mother -- Mother dead, Death due to natural cause, Dementia/Alzheimers Sister -- Sister alive and well ____________________________ SOCIAL HISTORY Alcohol Use:  socially;  Smoking:  used to smoke but quit 1989;  Diet:  regular diet without modifications;  Lifestyle:  married;  Exercise:  exercises regularly;  Occupation:  Retired, former Software engineer of [employer];  Residence:  lives with wife;   ____________________________ REVIEW OF SYSTEMS General:  mild obesity Eyes: wears eye glasses/contact lenses, no  diabetic  retinopathy Respiratory: denies dyspnea, cough, wheezing or hemoptysis. Cardiovascular:  please review HPI Abdominal: see HPI Genitourinary-Male: frequency, nocturia  Musculoskeletal:  denies arthritis, venous insufficiency, or muscle weakness Neurological:  denies headaches, stroke, or TIA  ____________________________ PHYSICAL EXAMINATION VITAL SIGNS  Blood Pressure:  140/66 Sitting, Left arm, regular cuff  , 130/76 Standing, Left arm and regular cuff   Pulse:  76/min. Weight:  187.00 lbs. Height:  64.00"BMI: 32  Constitutional:  pleasant white male in no acute distress, mildly obese Skin:  warm and dry to touch, no apparent skin lesions, or masses noted. Head:  normocephalic, normal hair pattern, no masses or tenderness Neck:  supple, without massess. No JVD, thyromegaly or carotid bruits. Carotid upstroke normal. Chest:  normal symmetry, clear to auscultation. Cardiac:  regular rhythm, normal S1 and S2, No S3 or S4, no murmurs, gallops or rubs detected. Peripheral Pulses:  the femoral,dorsalis pedis, and posterior tibial pulses are full and equal bilaterally with no bruits auscultated. Extremities & Back:  no deformities, clubbing, cyanosis, erythema or edema observed. Normal muscle strength and tone. Neurological:  no gross motor or sensory deficits noted, affect appropriate, oriented x3. ____________________________ MOST RECENT LIPID PANEL 01/15/18  CHOL TOTL 137 mg/dl, LDL 68 NM, HDL 40 mg/dl and TRIGLYCER 143 mg/dl ____________________________ IMPRESSIONS/PLAN  1. CAD with previous anterior infarction treated with PCI in 1990 with no symptoms 2. Hyperlipidemia under treatment good control 3. Diabetes mellitus with diabetic nephropathy 4. Stage III chronic kidney disease 5. Obesity with need to lose weight  Recommendations:  Patient is clinically doing well this year with no angina or other cardiac symptoms. Followup in one year with  EKG. ____________________________ TODAYS ORDERS  1. 12 Lead EKG: 1 year  2. Return Visit: 1 year                       ____________________________ Cardiology Physician:  Kerry Hough MD [facility]

## 2018-11-19 DIAGNOSIS — M25551 Pain in right hip: Secondary | ICD-10-CM | POA: Insufficient documentation

## 2019-01-24 ENCOUNTER — Encounter: Payer: Self-pay | Admitting: Cardiology

## 2019-01-27 ENCOUNTER — Encounter: Payer: Self-pay | Admitting: Cardiology

## 2019-01-27 ENCOUNTER — Ambulatory Visit (INDEPENDENT_AMBULATORY_CARE_PROVIDER_SITE_OTHER): Payer: Medicare Other | Admitting: Cardiology

## 2019-01-27 ENCOUNTER — Other Ambulatory Visit: Payer: Self-pay

## 2019-01-27 VITALS — BP 119/57 | HR 59 | Temp 98.3°F | Ht 63.0 in | Wt 174.8 lb

## 2019-01-27 DIAGNOSIS — R600 Localized edema: Secondary | ICD-10-CM | POA: Diagnosis not present

## 2019-01-27 DIAGNOSIS — I2510 Atherosclerotic heart disease of native coronary artery without angina pectoris: Secondary | ICD-10-CM

## 2019-01-27 MED ORDER — FUROSEMIDE 40 MG PO TABS: 40.0000 mg | ORAL_TABLET | Freq: Every day | ORAL | 2 refills | Status: DC

## 2019-01-27 NOTE — Progress Notes (Signed)
Patient referred by Robert Jordan, MD for coronary artery disease  Subjective:   Robert Lucas, male    DOB: December 30, 1940, 78 y.o.   MRN: [id number]   Chief Complaint  Patient presents with  . New Patient (Initial Visit)  . Coronary Artery Disease  . Hypertension  . Hyperlipidemia   HPI  78 y.o. Caucasian male with with hypertension, hyperlipidemia, IDDM, CAD s/p anterior MI in 16s treated wiuth TPA and PTCA, CKD 4, obesity, h/o gout, OSA  Patient was previously followed by Dr. Wynonia Lucas for 29 years. He has not had any recurrent MI since his 10, when he has PTCA to his LAD. Subsequent echocardiograms and stress test, most recently in 2014, have all been reassuring.  In April 2020, he started developing right hip pain for which he is seeing [facility].  He has been given treatment with prednisone, with minimal relief.  His walking is limited, due to hip pain.  At this point, there is no plan for surgery.  Prior to April 2020,p atient used to be more active, playing golf regularly, without chest pain, shortness of breath, palpitations, orthopnea, PND, TIA/syncope.  He has had stable leg edema for long time.  He does have CKD stage IV with a last known creatinine of 2.4, for which he sees nephrologist at [facility].  He currently is on Lasix 20 mg once daily.  Past Medical History:  Diagnosis Date  . Bell's palsy 09/14/2012  . CKD (chronic kidney disease)   . Diabetes ([place])   . Hypercholesteremia   . Hypertension   . Myocardial infarction ([place]) 1990     Past Surgical History:  Procedure Laterality Date  . BACK SURGERY  yrs ago   lower  . CARDIAC CATHETERIZATION    . CHOLECYSTECTOMY N/A 05/02/2017   Procedure: LAPAROSCOPIC CHOLECYSTECTOMY;  Surgeon: Robert Ok, MD;  Location: [address];  Service: General;  Laterality: N/A;  . KNEE SURGERY Left yrs ago  . ROTATOR CUFF REPAIR Left yrs ago  . SHOULDER OPEN ROTATOR CUFF REPAIR Right 04/23/2013   Procedure: RIGHT SHOULDER  ROTATOR CUFF REPAIR WITH GRAFT AND ANCHORS ;  Surgeon: Robert Bastos, MD;  Location: [facility];  Service: Orthopedics;  Laterality: Right;  . TONSILLECTOMY       Social History   Socioeconomic History  . Marital status: Married    Spouse name: Not on file  . Number of children: 3  . Years of education: Not on file  . Highest education level: Not on file  Occupational History  . Not on file  Social Needs  . Financial resource strain: Not on file  . Food insecurity    Worry: Not on file    Inability: Not on file  . Transportation needs    Medical: Not on file    Non-medical: Not on file  Tobacco Use  . Smoking status: Former Smoker    Types: Cigars    Quit date: 07/03/1988    Years since quitting: 30.5  . Smokeless tobacco: Never Used  Substance and Sexual Activity  . Alcohol use: Yes    Comment: occasional beer  . Drug use: No  . Sexual activity: Not on file  Lifestyle  . Physical activity    Days per week: Not on file    Minutes per session: Not on file  . Stress: Not on file  Relationships  . Social Herbalist on phone: Not on file    Gets together: Not on  file    Attends religious service: Not on file    Active member of club or organization: Not on file    Attends meetings of clubs or organizations: Not on file    Relationship status: Not on file  . Intimate partner violence    Fear of current or ex partner: Not on file    Emotionally abused: Not on file    Physically abused: Not on file    Forced sexual activity: Not on file  Other Topics Concern  . Not on file  Social History Narrative  . Not on file     Family History  Problem Relation Age of Onset  . Dementia Mother   . Stroke Brother      Current Outpatient Medications on File Prior to Visit  Medication Sig Dispense Refill  . atorvastatin (LIPITOR) 40 MG tablet Take 40 mg by mouth daily.    Marland Kitchen acetaminophen (TYLENOL) 325 MG tablet Take 325-650 mg by mouth every 6 (six) hours as  needed (for pain or headaches).    Marland Kitchen allopurinol (ZYLOPRIM) 100 MG tablet Take 100 mg by mouth every evening.     Marland Kitchen aspirin EC 81 MG tablet Take 81 mg by mouth every evening.     . Cholecalciferol (VITAMIN D3 PO) Take 2,000 Units by mouth daily.     Marland Kitchen diltiazem (TIAZAC) 420 MG 24 hr capsule Take 420 mg by mouth daily.    . finasteride (PROSCAR) 5 MG tablet Take 5 mg by mouth every evening.     . fluticasone (FLONASE) 50 MCG/ACT nasal spray Place 1 spray into both nostrils daily as needed for allergies or rhinitis.    Marland Kitchen insulin aspart protamine- aspart (NOVOLOG MIX 70/30) (70-30) 100 UNIT/ML injection Inject 20 Units into the skin 2 (two) times daily before a meal.     . losartan (COZAAR) 100 MG tablet Take 100 mg by mouth every evening.     . terazosin (HYTRIN) 10 MG capsule Take 10 mg by mouth at bedtime.      No current facility-administered medications on file prior to visit.     Cardiovascular studies:  EKG 01/27/2019:  Sinus rhythm 72 bpm. Possible old anteroseptal infarct  Echocardiogram 2014: EF 50%  Treadmill Cardiolite stress test 2014: Results not available  Cath 1990: Normal Left main, 99% stenosis proximal LAD, no significant disease CFX RCA, PTCA of LAD done;   Recent labs: Results for Robert Lucas, Robert Lucas (MRN [id number]) as of 01/27/2019 10:03  Ref. Range 11/15/2017 98:33  BASIC METABOLIC PANEL Unknown Rpt (A)  Sodium Latest Ref Range: 135 - 145 mmol/L 136  Potassium Latest Ref Range: 3.5 - 5.1 mmol/L 5.1  Chloride Latest Ref Range: 101 - 111 mmol/L 101  CO2 Latest Ref Range: 22 - 32 mmol/L 26  Glucose Latest Ref Range: 65 - 99 mg/dL 196 (H)  BUN Latest Ref Range: 6 - 20 mg/dL 29 (H)  Creatinine Latest Ref Range: 0.61 - 1.24 mg/dL 2.04 (H)  Calcium Latest Ref Range: 8.9 - 10.3 mg/dL 9.3  Anion gap Latest Ref Range: 5 - 15  9  Alkaline Phosphatase Latest Ref Range: 38 - 126 U/L 77  Albumin Latest Ref Range: 3.5 - 5.0 g/dL 3.6  Lipase Latest Ref Range: 11 - 51 U/L 32   AST Latest Ref Range: 15 - 41 U/L 22  ALT Latest Ref Range: 17 - 63 U/L 19  Total Protein Latest Ref Range: 6.5 - 8.1 g/dL 6.8  Bilirubin, Direct Latest  Ref Range: 0.1 - 0.5 mg/dL <0.1 (L)  Indirect Bilirubin Latest Ref Range: 0.3 - 0.9 mg/dL NOT CALCULATED  Total Bilirubin Latest Ref Range: 0.3 - 1.2 mg/dL 0.6  GFR, Est Non African American Latest Ref Range: >60 mL/min 30 (L)  GFR, Est African American Latest Ref Range: >60 mL/min 34 (L)   Results for MIKHAEL, HENDRIKS (MRN [id number]) as of 01/27/2019 10:03  Ref. Range 11/15/2017 17:26  WBC Latest Ref Range: 4.0 - 10.5 K/uL 7.7  RBC Latest Ref Range: 4.22 - 5.81 MIL/uL 4.18 (L)  Hemoglobin Latest Ref Range: 13.0 - 17.0 g/dL 13.0  HCT Latest Ref Range: 39.0 - 52.0 % 37.6 (L)  MCV Latest Ref Range: 78.0 - 100.0 fL 90.0  MCH Latest Ref Range: 26.0 - 34.0 pg 31.1  MCHC Latest Ref Range: 30.0 - 36.0 g/dL 34.6  RDW Latest Ref Range: 11.5 - 15.5 % 13.9  Platelets Latest Ref Range: 150 - 400 K/uL 220   Results for BASSAM, DRESCH (MRN [id number]) as of 01/27/2019 10:04  Ref. Range 04/30/2017 12:51 11/15/2017 17:26  Glucose Latest Ref Range: 65 - 99 mg/dL 123 (H) 196 (H)  Hemoglobin A1C Latest Ref Range: 4.8 - 5.6 % 7.7 (H)      Review of Systems  Constitution: Negative for decreased appetite, malaise/fatigue, weight gain and weight loss.  HENT: Negative for congestion.   Eyes: Negative for visual disturbance.  Cardiovascular: Negative for chest pain, dyspnea on exertion, leg swelling, palpitations and syncope.  Respiratory: Negative for cough.   Endocrine: Negative for cold intolerance.  Hematologic/Lymphatic: Does not bruise/bleed easily.  Skin: Negative for itching and rash.  Musculoskeletal: Negative for myalgias.  Gastrointestinal: Negative for abdominal pain, nausea and vomiting.  Genitourinary: Negative for dysuria.  Neurological: Negative for dizziness and weakness.  Psychiatric/Behavioral: The patient is not nervous/anxious.    All other systems reviewed and are negative.       Vitals:   01/27/19 1121  BP: (!) 119/57  Pulse: (!) 59  Temp: 98.3 F (36.8 C)  SpO2: 96%     Body mass index is 30.96 kg/m. Filed Weights   01/27/19 1121  Weight: 79.3 kg     Objective:   Physical Exam  Constitutional: He is oriented to person, place, and time. He appears well-developed and well-nourished. No distress.  HENT:  Head: Normocephalic and atraumatic.  Eyes: Pupils are equal, round, and reactive to light. Conjunctivae are normal.  Neck: No JVD present.  Cardiovascular: Normal rate, regular rhythm and intact distal pulses.  Pulmonary/Chest: Effort normal and breath sounds normal. He has no wheezes. He has no rales.  Abdominal: Soft. Bowel sounds are normal. There is no rebound.  Musculoskeletal:        General: Edema (1-2+ b/l) present.  Lymphadenopathy:    He has no cervical adenopathy.  Neurological: He is alert and oriented to person, place, and time. No cranial nerve deficit.  Skin: Skin is warm and dry.  Psychiatric: He has a normal mood and affect.  Nursing note and vitals reviewed.         Assessment & Recommendations:   78 y.o. Caucasian male with with hypertension, hyperlipidemia, IDDM, CAD s/p anterior MI in 35s treated wiuth TPA and PTCA, CKD 4, obesity, h/o gout, OSA  1. Coronary artery disease involving native coronary artery of native heart without angina pectoris No angina symptoms. Continue Aspirin, statin, diltiazem.  2. Leg edema I have increased his lasix to 40 mg daily. I will obtain  echocardiogram to evaluate for any cardiac causes. I encourage him to follow up with his nephrologist. He is scheduled to have a BMP next week. I have requested him to forward the results to me.   Thank you for referring the patient to [place]. Please feel free to contact with any questions.  Nigel Mormon, MD [facility] Pager: [phone] Office: [phone] If no answer  Cell [phone]

## 2019-01-28 ENCOUNTER — Encounter: Payer: Self-pay | Admitting: Cardiology

## 2019-01-28 DIAGNOSIS — R600 Localized edema: Secondary | ICD-10-CM | POA: Insufficient documentation

## 2019-01-28 DIAGNOSIS — I2510 Atherosclerotic heart disease of native coronary artery without angina pectoris: Secondary | ICD-10-CM | POA: Insufficient documentation

## 2019-02-05 ENCOUNTER — Other Ambulatory Visit: Payer: Self-pay

## 2019-02-05 ENCOUNTER — Ambulatory Visit (INDEPENDENT_AMBULATORY_CARE_PROVIDER_SITE_OTHER): Payer: Medicare Other

## 2019-02-05 DIAGNOSIS — R600 Localized edema: Secondary | ICD-10-CM

## 2019-04-15 ENCOUNTER — Inpatient Hospital Stay: Admit: 2019-04-15 | Payer: Medicare Other | Admitting: Orthopedic Surgery

## 2019-04-15 SURGERY — ARTHROPLASTY, HIP, TOTAL, ANTERIOR APPROACH
Anesthesia: Spinal | Site: Hip | Laterality: Right

## 2019-04-24 NOTE — Patient Instructions (Addendum)
DUE TO COVID-19 ONLY ONE VISITOR IS ALLOWED TO COME WITH YOU AND STAY IN THE WAITING ROOM ONLY DURING PRE OP AND PROCEDURE DAY OF SURGERY. THE 1 VISITOR MAY VISIT WITH YOU AFTER SURGERY IN YOUR PRIVATE ROOM DURING VISITING HOURS ONLY!  YOU NEED TO HAVE A COVID 19 TEST ON_______ @_______ , THIS TEST MUST BE DONE BEFORE SURGERY, COME  [place]  , [id number].  ([place]) ONCE YOUR COVID TEST IS COMPLETED, PLEASE BEGIN THE QUARANTINE INSTRUCTIONS AS OUTLINED IN YOUR HANDOUT.                Robert Lucas  04/24/2019   Your procedure is scheduled on: 05-06-19   Report to [facility] Main  Entrance   Report to admitting at         1150 AM     Call this number if you have problems the morning of surgery [phone]    Remember: NO SOLID FOOD AFTER MIDNIGHT THE NIGHT PRIOR TO SURGERY. NOTHING BY MOUTH EXCEPT CLEAR LIQUIDS UNTIL     1120 am  . PLEASE FINISH Gatorade  DRINK PER SURGEON ORDER  WHICH NEEDS TO BE COMPLETED AT  1120 am then nothing by mouth .    CLEAR LIQUID DIET   Foods Allowed                                                                     Foods Excluded  Coffee and tea, regular and decaf                             liquids that you cannot  Plain Jell-O any favor except red or purple                                           see through such as: Fruit ices (not with fruit pulp)                                     milk, soups, orange juice  Iced Popsicles                                    All solid food Carbonated beverages, regular and diet                                    Cranberry, grape and apple juices Sports drinks like Gatorade Lightly seasoned clear broth or consume(fat free) Sugar, honey syrup  Sample Menu Breakfast                                Lunch  Supper Cranberry juice                    Beef broth                            Chicken broth Jell-O                                      Grape juice                           Apple juice Coffee or tea                        Jell-O                                      Popsicle                                                Coffee or tea                        Coffee or tea  _____________________________________________________________________     BRUSH YOUR TEETH MORNING OF SURGERY AND RINSE YOUR MOUTH OUT, NO CHEWING GUM CANDY OR MINTS. How to Manage Your Diabetes Before and After Surgery  Why is it important to control my blood sugar before and after surgery? . Improving blood sugar levels before and after surgery helps healing and can limit problems. . A way of improving blood sugar control is eating a healthy diet by: o  Eating less sugar and carbohydrates o  Increasing activity/exercise o  Talking with your doctor about reaching your blood sugar goals . High blood sugars (greater than 180 mg/dL) can raise your risk of infections and slow your recovery, so you will need to focus on controlling your diabetes during the weeks before surgery. . Make sure that the doctor who takes care of your diabetes knows about your planned surgery including the date and location.  How do I manage my blood sugar before surgery? . Check your blood sugar at least 4 times a day, starting 2 days before surgery, to make sure that the level is not too high or low. o Check your blood sugar the morning of your surgery when you wake up and every 2 hours until you get to the Short Stay unit. . If your blood sugar is less than 70 mg/dL, you will need to treat for low blood sugar: o Do not take insulin. o Treat a low blood sugar (less than 70 mg/dL) with  cup of clear juice (cranberry or apple), 4 glucose tablets, OR glucose gel. o Recheck blood sugar in 15 minutes after treatment (to make sure it is greater than 70 mg/dL). If your blood sugar is not greater than 70 mg/dL on recheck, call [phone] for further instructions. . Report  your blood sugar to the short stay nurse when you get to Short Stay.  . If you are admitted to the hospital after surgery: o Your blood sugar will be checked by the  staff and you will probably be given insulin after surgery (instead of oral diabetes medicines) to make sure you have good blood sugar levels. o The goal for blood sugar control after surgery is 80-180 mg/dL.   WHAT DO I DO ABOUT MY DIABETES MEDICATION?     DAY BEFORE TAKE ALL OF AM DOSE IF NEEDED AND pm DOSE  IF NEEDED   TAKE  70 % OF 20 UNITS IS 14 UNITS DO NOT TAKE ANY THE DAY OF SURGERY   Take these medicines the morning of surgery with A SIP OF WATER: dilitiazem, lipitor  DO NOT TAKE ANY DIABETIC MEDICATIONS DAY OF YOUR SURGERY                               You may not have any metal on your body including hair pins and              piercings  Do not wear jewelry,  lotions, powders or perfumes, deodorant                Men may shave face and neck.   Do not bring valuables to the hospital. [place].  Contacts, dentures or bridgework may not be worn into surgery.                 Please read over the following fact sheets you were given: ___________________________________________________________________          [facility] - Preparing for Surgery Before surgery, you can play an important role.  Because skin is not sterile, your skin needs to be as free of germs as possible.  You can reduce the number of germs on your skin by washing with CHG (chlorahexidine gluconate) soap before surgery.  CHG is an antiseptic cleaner which kills germs and bonds with the skin to continue killing germs even after washing. Please DO NOT use if you have an allergy to CHG or antibacterial soaps.  If your skin becomes reddened/irritated stop using the CHG and inform your nurse when you arrive at Short Stay. Do not shave (including legs and underarms) for at least 48 hours prior to the first  CHG shower.  You may shave your face/neck. Please follow these instructions carefully:  1.  Shower with CHG Soap the night before surgery and the  morning of Surgery.  2.  If you choose to wash your hair, wash your hair first as usual with your  normal  shampoo.  3.  After you shampoo, rinse your hair and body thoroughly to remove the  shampoo.                           4.  Use CHG as you would any other liquid soap.  You can apply chg directly  to the skin and wash                       Gently with a scrungie or clean washcloth.  5.  Apply the CHG Soap to your body ONLY FROM THE NECK DOWN.   Do not use on face/ open                           Wound or open  sores. Avoid contact with eyes, ears mouth and genitals (private parts).                       Wash face,  Genitals (private parts) with your normal soap.             6.  Wash thoroughly, paying special attention to the area where your surgery  will be performed.  7.  Thoroughly rinse your body with warm water from the neck down.  8.  DO NOT shower/wash with your normal soap after using and rinsing off  the CHG Soap.                9.  Pat yourself dry with a clean towel.            10.  Wear clean pajamas.            11.  Place clean sheets on your bed the night of your first shower and do not  sleep with pets. Day of Surgery : Do not apply any lotions/deodorants the morning of surgery.  Please wear clean clothes to the hospital/surgery center.  FAILURE TO FOLLOW THESE INSTRUCTIONS MAY RESULT IN THE CANCELLATION OF YOUR SURGERY PATIENT SIGNATURE_________________________________  NURSE SIGNATURE__________________________________  ________________________________________________________________________   Adam Phenix  An incentive spirometer is a tool that can help keep your lungs clear and active. This tool measures how well you are filling your lungs with each breath. Taking long deep breaths may help reverse or decrease the  chance of developing breathing (pulmonary) problems (especially infection) following:  A long period of time when you are unable to move or be active. BEFORE THE PROCEDURE   If the spirometer includes an indicator to show your best effort, your nurse or respiratory therapist will set it to a desired goal.  If possible, sit up straight or lean slightly forward. Try not to slouch.  Hold the incentive spirometer in an upright position. INSTRUCTIONS FOR USE  1. Sit on the edge of your bed if possible, or sit up as far as you can in bed or on a chair. 2. Hold the incentive spirometer in an upright position. 3. Breathe out normally. 4. Place the mouthpiece in your mouth and seal your lips tightly around it. 5. Breathe in slowly and as deeply as possible, raising the piston or the ball toward the top of the column. 6. Hold your breath for 3-5 seconds or for as long as possible. Allow the piston or ball to fall to the bottom of the column. 7. Remove the mouthpiece from your mouth and breathe out normally. 8. Rest for a few seconds and repeat Steps 1 through 7 at least 10 times every 1-2 hours when you are awake. Take your time and take a few normal breaths between deep breaths. 9. The spirometer may include an indicator to show your best effort. Use the indicator as a goal to work toward during each repetition. 10. After each set of 10 deep breaths, practice coughing to be sure your lungs are clear. If you have an incision (the cut made at the time of surgery), support your incision when coughing by placing a pillow or rolled up towels firmly against it. Once you are able to get out of bed, walk around indoors and cough well. You may stop using the incentive spirometer when instructed by your caregiver.  RISKS AND COMPLICATIONS  Take your time so you do not get dizzy or light-headed.  If you are in pain, you may need to take or ask for pain medication before doing incentive spirometry. It is harder  to take a deep breath if you are having pain. AFTER USE  Rest and breathe slowly and easily.  It can be helpful to keep track of a log of your progress. Your caregiver can provide you with a simple table to help with this. If you are using the spirometer at home, follow these instructions: [place] IF:   You are having difficultly using the spirometer.  You have trouble using the spirometer as often as instructed.  Your pain medication is not giving enough relief while using the spirometer.  You develop fever of 100.5 F (38.1 C) or higher. SEEK IMMEDIATE MEDICAL CARE IF:   You cough up bloody sputum that had not been present before.  You develop fever of 102 F (38.9 C) or greater.  You develop worsening pain at or near the incision site. MAKE SURE YOU:   Understand these instructions.  Will watch your condition.  Will get help right away if you are not doing well or get worse. Document Released: 10/30/2006 Document Revised: 09/11/2011 Document Reviewed: 12/31/2006 ExitCare Patient Information 2014 Exit[employer], [place].   ________________________________________________________________________  WHAT IS A BLOOD TRANSFUSION? Blood Transfusion Information  A transfusion is the replacement of blood or some of its parts. Blood is made up of multiple cells which provide different functions.  Red blood cells carry oxygen and are used for blood loss replacement.  White blood cells fight against infection.  Platelets control bleeding.  Plasma helps clot blood.  Other blood products are available for specialized needs, such as hemophilia or other clotting disorders. BEFORE THE TRANSFUSION  Who gives blood for transfusions?   Healthy volunteers who are fully evaluated to make sure their blood is safe. This is blood bank blood. Transfusion therapy is the safest it has ever been in the practice of medicine. Before blood is taken from a donor, a complete history is taken  to make sure that person has no history of diseases nor engages in risky social behavior (examples are intravenous drug use or sexual activity with multiple partners). The donor's travel history is screened to minimize risk of transmitting infections, such as malaria. The donated blood is tested for signs of infectious diseases, such as HIV and hepatitis. The blood is then tested to be sure it is compatible with you in order to minimize the chance of a transfusion reaction. If you or a relative donates blood, this is often done in anticipation of surgery and is not appropriate for emergency situations. It takes many days to process the donated blood. RISKS AND COMPLICATIONS Although transfusion therapy is very safe and saves many lives, the main dangers of transfusion include:   Getting an infectious disease.  Developing a transfusion reaction. This is an allergic reaction to something in the blood you were given. Every precaution is taken to prevent this. The decision to have a blood transfusion has been considered carefully by your caregiver before blood is given. Blood is not given unless the benefits outweigh the risks. AFTER THE TRANSFUSION  Right after receiving a blood transfusion, you will usually feel much better and more energetic. This is especially true if your red blood cells have gotten low (anemic). The transfusion raises the level of the red blood cells which carry oxygen, and this usually causes an energy increase.  The nurse administering the transfusion will monitor you carefully for  complications. HOME CARE INSTRUCTIONS  No special instructions are needed after a transfusion. You may find your energy is better. Speak with your caregiver about any limitations on activity for underlying diseases you may have. SEEK MEDICAL CARE IF:   Your condition is not improving after your transfusion.  You develop redness or irritation at the intravenous (IV) site. SEEK IMMEDIATE MEDICAL CARE  IF:  Any of the following symptoms occur over the next 12 hours:  Shaking chills.  You have a temperature by mouth above 102 F (38.9 C), not controlled by medicine.  Chest, back, or muscle pain.  People around you feel you are not acting correctly or are confused.  Shortness of breath or difficulty breathing.  Dizziness and fainting.  You get a rash or develop hives.  You have a decrease in urine output.  Your urine turns a dark color or changes to pink, red, or brown. Any of the following symptoms occur over the next 10 days:  You have a temperature by mouth above 102 F (38.9 C), not controlled by medicine.  Shortness of breath.  Weakness after normal activity.  The white part of the eye turns yellow (jaundice).  You have a decrease in the amount of urine or are urinating less often.  Your urine turns a dark color or changes to pink, red, or brown. Document Released: 06/16/2000 Document Revised: 09/11/2011 Document Reviewed: 02/03/2008 [facility] Patient Information 2014 [place], [place].  _______________________________________________________________________

## 2019-04-24 NOTE — H&P (Signed)
TOTAL HIP ADMISSION H&P  Patient is admitted for right total hip arthroplasty, anterior approach.  Subjective:  Chief Complaint:   Right hip primary OA / pain  HPI: Robert Lucas, 78 y.o. male, has a history of pain and functional disability in the right hip(s) due to arthritis and patient has failed non-surgical conservative treatments for greater than 12 weeks to include NSAID's and/or analgesics, corticosteriod injections, supervised PT with diminished ADL's post treatment, use of assistive devices, activity modification and chiropractor.  Onset of symptoms was abrupt starting in March 2020  with rapidlly worsening course since that time.The patient noted no past surgery on the right hip(s).  Patient currently rates pain in the right hip at 9 out of 10 with activity. Patient has night pain, worsening of pain with activity and weight bearing, trendelenberg gait, pain that interfers with activities of daily living and pain with passive range of motion. Patient has evidence of periarticular osteophytes and joint space narrowing by imaging studies. This condition presents safety issues increasing the risk of falls.   There is no current active infection.  Risks, benefits and expectations were discussed with the patient.  Risks including but not limited to the risk of anesthesia, blood clots, nerve damage, blood vessel damage, failure of the prosthesis, infection and up to and including death.  Patient understand the risks, benefits and expectations and wishes to proceed with surgery.   PCP: Jonathon Jordan, MD  D/C Plans:       Home   Post-op Meds:       No Rx given   Tranexamic Acid:      To be given - IV   Decadron:      Is to be given  FYI:      ASA  APAP (doesn't tolerate narcotics well)  No Celebrex (CKD)  DME:   Pt already has equipment   PT:   [facility]  Pharmacy: [facility]   Patient Active Problem List   Diagnosis Date Noted  . Coronary artery disease involving  native coronary artery of native heart without angina pectoris 01/28/2019  . Leg edema 01/28/2019  . Complete tear of rotator cuff 04/23/2013  . Chest pain 10/13/2012  . Diabetes mellitus ([place]) 10/13/2012  . Hypertension 10/13/2012  . Hyperlipidemia 10/13/2012  . Gout 10/13/2012  . Bell's palsy 10/13/2012  . Weakness 10/13/2012   Past Medical History:  Diagnosis Date  . Bell's palsy 09/14/2012  . CKD (chronic kidney disease)   . Diabetes ([place])   . Hypercholesteremia   . Hypertension   . Myocardial infarction ([place]) 1990    Past Surgical History:  Procedure Laterality Date  . BACK SURGERY  yrs ago   lower  . CARDIAC CATHETERIZATION    . CHOLECYSTECTOMY N/A 05/02/2017   Procedure: LAPAROSCOPIC CHOLECYSTECTOMY;  Surgeon: Ralene Ok, MD;  Location: [facility];  Service: General;  Laterality: N/A;  . KNEE SURGERY Left yrs ago  . ROTATOR CUFF REPAIR Left yrs ago  . SHOULDER OPEN ROTATOR CUFF REPAIR Right 04/23/2013   Procedure: RIGHT SHOULDER ROTATOR CUFF REPAIR WITH GRAFT AND ANCHORS ;  Surgeon: Tobi Bastos, MD;  Location: [facility];  Service: Orthopedics;  Laterality: Right;  . TONSILLECTOMY      No current facility-administered medications for this encounter.    Current Outpatient Medications  Medication Sig Dispense Refill Last Dose  . acetaminophen (TYLENOL) 500 MG tablet Take 1,000 mg by mouth every 6 (six) hours as needed for moderate pain or  headache.      . allopurinol (ZYLOPRIM) 100 MG tablet Take 100 mg by mouth every evening.      Marland Kitchen aspirin EC 81 MG tablet Take 81 mg by mouth every evening.      Marland Kitchen atorvastatin (LIPITOR) 40 MG tablet Take 40 mg by mouth daily.     . Cholecalciferol (VITAMIN D3) 50 MCG (2000 UT) TABS Take 2,000 Units by mouth daily.      Marland Kitchen diltiazem (TIAZAC) 420 MG 24 hr capsule Take 420 mg by mouth daily.     . finasteride (PROSCAR) 5 MG tablet Take 5 mg by mouth every evening.      . fluticasone (FLONASE) 50 MCG/ACT nasal spray Place 1 spray into  both nostrils daily as needed for allergies or rhinitis.     . furosemide (LASIX) 40 MG tablet Take 1 tablet (40 mg total) by mouth daily. (Patient taking differently: Take 40 mg by mouth every other day. ) 90 tablet 2   . insulin aspart protamine- aspart (NOVOLOG MIX 70/30) (70-30) 100 UNIT/ML injection Inject 20 Units into the skin 2 (two) times daily as needed (blood sugar above 140).      Marland Kitchen losartan (COZAAR) 100 MG tablet Take 100 mg by mouth every evening.      . terazosin (HYTRIN) 10 MG capsule Take 10 mg by mouth at bedtime.       Allergies  Allergen Reactions  . Lisinopril Cough  . Prednisone     Elevates BGL; patient prefers to not take this  . Penicillins Rash and Other (See Comments)    Rash around ankles Has patient had a PCN reaction causing immediate rash, facial/tongue/throat swelling, SOB or lightheadedness with hypotension: Yes Has patient had a PCN reaction causing severe rash involving mucus membranes or skin necrosis: Unk Has patient had a PCN reaction that required hospitalization: No Has patient had a PCN reaction occurring within the last 10 years: No If all of the above answers are "NO", then may proceed with Cephalosporin use.     Social History   Tobacco Use  . Smoking status: Former Smoker    Years: 10.00    Types: Cigars    Quit date: 07/03/1988    Years since quitting: 30.8  . Smokeless tobacco: Never Used  Substance Use Topics  . Alcohol use: Yes    Comment: occasional beer    Family History  Problem Relation Age of Onset  . Dementia Mother   . Stroke Brother      Review of Systems  Constitutional: Negative.   HENT: Negative.   Eyes: Negative.   Respiratory: Negative.   Cardiovascular: Negative.   Gastrointestinal: Negative.   Genitourinary: Negative.   Musculoskeletal: Positive for joint pain.  Skin: Negative.   Neurological: Negative.   Endo/Heme/Allergies: Negative.   Psychiatric/Behavioral: Negative.     Objective:  Physical Exam   Constitutional: He is oriented to person, place, and time. He appears well-developed.  HENT:  Head: Normocephalic.  Eyes: Pupils are equal, round, and reactive to light.  Neck: Neck supple. No JVD present. No tracheal deviation present. No thyromegaly present.  Cardiovascular: Normal rate, regular rhythm and intact distal pulses.  Respiratory: Effort normal and breath sounds normal. No respiratory distress. He has no wheezes.  GI: Soft. There is no abdominal tenderness. There is no guarding.  Musculoskeletal:     Right hip: He exhibits decreased range of motion, decreased strength, tenderness and bony tenderness. He exhibits no swelling, no deformity and  no laceration.  Lymphadenopathy:    He has no cervical adenopathy.  Neurological: He is alert and oriented to person, place, and time.  Skin: Skin is warm and dry.  Psychiatric: He has a normal mood and affect.      Labs:  Estimated body mass index is 30.96 kg/m as calculated from the following:   Height as of 01/27/19: 5\' 3"  (1.6 m).   Weight as of 01/27/19: 79.3 kg.   Imaging Review Plain radiographs demonstrate severe degenerative joint disease of the right hip(s). The bone quality appears to be good for age and reported activity level.      Assessment/Plan:  End stage arthritis, right hip(s)  The patient history, physical examination, clinical judgement of the provider and imaging studies are consistent with end stage degenerative joint disease of the right hip(s) and total hip arthroplasty is deemed medically necessary. The treatment options including medical management, injection therapy, arthroscopy and arthroplasty were discussed at length. The risks and benefits of total hip arthroplasty were presented and reviewed. The risks due to aseptic loosening, infection, stiffness, dislocation/subluxation,  thromboembolic complications and other imponderables were discussed.  The patient acknowledged the explanation, agreed to  proceed with the plan and consent was signed. Patient is being admitted for inpatient treatment for surgery, pain control, PT, OT, prophylactic antibiotics, VTE prophylaxis, progressive ambulation and ADL's and discharge planning.The patient is planning to be discharged home.   [place] Pugh Marilea Gwynne   PA-C  04/24/2019, 8:30 PM

## 2019-04-24 NOTE — Progress Notes (Signed)
PCP - Pilar Plate  Cardiologist - lov Dr. Virgina Jock 01-27-19 epic  Chest x-ray -  EKG - 01-27-19 epic Stress Test -  ECHO - 02-05-19 epic Cardiac Cath -   Sleep Study -  CPAP -   Fasting Blood Sugar - 120-130 Checks Blood Sugar ___3__ times a day  Blood Thinner Instructions: Aspirin Instructions: Last Dose:  Anesthesia review: MI 1990 w/ PCI, trace aortic stenosis echo 02-05-19   Patient denies shortness of breath, fever, cough and chest pain at PAT appointment   none   Patient verbalized understanding of instructions that were given to them at the PAT appointment. Patient was also instructed that they will need to review over the PAT instructions again at home before surgery. NOTE

## 2019-04-29 ENCOUNTER — Encounter (HOSPITAL_COMMUNITY)
Admission: RE | Admit: 2019-04-29 | Discharge: 2019-04-29 | Disposition: A | Discharge status: Home / Self Care | Payer: Medicare Other | Source: Ambulatory Visit | Attending: Orthopedic Surgery | Admitting: Orthopedic Surgery

## 2019-04-29 ENCOUNTER — Encounter (HOSPITAL_COMMUNITY): Payer: Self-pay

## 2019-04-29 ENCOUNTER — Other Ambulatory Visit: Payer: Self-pay

## 2019-04-29 DIAGNOSIS — Z01812 Encounter for preprocedural laboratory examination: Secondary | ICD-10-CM | POA: Insufficient documentation

## 2019-04-29 DIAGNOSIS — M25551 Pain in right hip: Secondary | ICD-10-CM | POA: Insufficient documentation

## 2019-04-29 DIAGNOSIS — M1611 Unilateral primary osteoarthritis, right hip: Secondary | ICD-10-CM | POA: Insufficient documentation

## 2019-04-29 HISTORY — DX: Unspecified osteoarthritis, unspecified site: M19.90

## 2019-04-29 HISTORY — DX: Myoneural disorder, unspecified: G70.9

## 2019-04-29 LAB — BASIC METABOLIC PANEL
Anion gap: 8 (ref 5–15)
BUN: 49 mg/dL — ABNORMAL HIGH (ref 8–23)
CO2: 22 mmol/L (ref 22–32)
Calcium: 9 mg/dL (ref 8.9–10.3)
Chloride: 105 mmol/L (ref 98–111)
Creatinine, Ser: 2.28 mg/dL — ABNORMAL HIGH (ref 0.61–1.24)
GFR calc Af Amer: 30 mL/min — ABNORMAL LOW (ref >60)
GFR calc non Af Amer: 26 mL/min — ABNORMAL LOW (ref >60)
Glucose, Bld: 164 mg/dL — ABNORMAL HIGH (ref 70–99)
Potassium: 4.6 mmol/L (ref 3.5–5.1)
Sodium: 135 mmol/L (ref 135–145)

## 2019-04-29 LAB — CBC
HCT: 36 % — ABNORMAL LOW (ref 39.0–52.0)
Hemoglobin: 12.2 g/dL — ABNORMAL LOW (ref 13.0–17.0)
MCH: 32.4 pg (ref 26.0–34.0)
MCHC: 33.9 g/dL (ref 30.0–36.0)
MCV: 95.5 fL (ref 80.0–100.0)
Platelets: 193 10*3/uL (ref 150–400)
RBC: 3.77 MIL/uL — ABNORMAL LOW (ref 4.22–5.81)
RDW: 12.5 % (ref 11.5–15.5)
WBC: 7.8 10*3/uL (ref 4.0–10.5)
nRBC: 0 % (ref 0.0–0.2)

## 2019-04-29 LAB — HEMOGLOBIN A1C
Hgb A1c MFr Bld: 6.7 % — ABNORMAL HIGH (ref 4.8–5.6)
Mean Plasma Glucose: 145.59 mg/dL

## 2019-04-29 LAB — GLUCOSE, CAPILLARY: Glucose-Capillary: 152 mg/dL — ABNORMAL HIGH (ref 70–99)

## 2019-04-29 LAB — SURGICAL PCR SCREEN
MRSA, PCR: NEGATIVE
Staphylococcus aureus: POSITIVE — AB

## 2019-05-01 NOTE — Progress Notes (Signed)
Anesthesia Chart Review   Case: [id number] Date/Time: 05/06/19 0825   Procedure: TOTAL HIP ARTHROPLASTY ANTERIOR APPROACH (Right Hip) - 70 mins   Anesthesia type: Spinal   Pre-op diagnosis: Right hip osteoarthritis   Location: [facility] ROOM 10 / [facility]   Surgeon: Paralee Cancel, MD      DISCUSSION:78 y.o. former smoker (quit 07/03/88) with h/o HTN, Dm II, CKD Stage IV, CAD s/p MI 1990 PTCA to LAD, right hip OA scheduled for above procedure 11/03/202 with Dr. Paralee Cancel.   Pt last seen by cardiologist, Dr. Vernell Leep, 01/27/2019.  CAD stable without angina symptoms.  Echo 02/05/2019, "Normal heart function. Mild leakiness of mitral valve. No signifciant changes compared to previous study in 2014."  Pt last seen by PCP 04/09/2019.  Nurse reports creatinine 06/2018 2.18, creatinine stable.   VS: BP 137/61 (BP Location: Right Arm)   Pulse 67   Temp 36.8 C (Oral)   Resp 18   Ht 5\' 3"  (1.6 m)   Wt 77.2 kg   SpO2 100%   BMI 30.15 kg/m   PROVIDERS: [name], NP is PCP   Vernell Leep, MD is Cardiologist  LABS: Labs reviewed: Acceptable for surgery. (all labs ordered are listed, but only abnormal results are displayed)  Labs Reviewed  SURGICAL PCR SCREEN - Abnormal; Notable for the following components:      Result Value   Staphylococcus aureus POSITIVE (*)    All other components within normal limits  GLUCOSE, CAPILLARY - Abnormal; Notable for the following components:   Glucose-Capillary 152 (*)    All other components within normal limits  CBC - Abnormal; Notable for the following components:   RBC 3.77 (*)    Hemoglobin 12.2 (*)    HCT 36.0 (*)    All other components within normal limits  BASIC METABOLIC PANEL - Abnormal; Notable for the following components:   Glucose, Bld 164 (*)    BUN 49 (*)    Creatinine, Ser 2.28 (*)    GFR calc non Af Amer 26 (*)    GFR calc Af Amer 30 (*)    All other components within normal limits  HEMOGLOBIN A1C - Abnormal; Notable  for the following components:   Hgb A1c MFr Bld 6.7 (*)    All other components within normal limits  TYPE AND SCREEN     IMAGES:   EKG: 01/27/2019 Rate 72 bpm Sinus rhythm 752 bpm Possible old anteroseptal infarct  CV: Echocardiogram 02/05/2019: Left ventricle cavity is normal in size. Mild concentric hypertrophy of the left ventricle. Normal LV systolic function with EF 55%. Normal global wall motion. Doppler evidence of grade I (impaired) diastolic dysfunction, normal LAP.  Left atrial cavity is mildly dilated. Trace aortic valve stenosis. Mild (Grade I) mitral regurgitation. Inadequate TR jet to estimate pulmonary artery systolic pressure. Normal right atrial pressure.  No significant change compared to previous study in 2014.  Past Medical History:  Diagnosis Date  . Arthritis   . Bell's palsy    one episode  . CKD (chronic kidney disease)    sees [place] in [place]  . Diabetes ([place])    type 2   sees endo at [place] in [place]  . Hypercholesteremia   . Hypertension   . Myocardial infarction ([place]) 1990  . Neuromuscular disorder [facility])     Past Surgical History:  Procedure Laterality Date  . BACK SURGERY  yrs ago   lower  . CARDIAC CATHETERIZATION    . CHOLECYSTECTOMY N/A  05/02/2017   Procedure: LAPAROSCOPIC CHOLECYSTECTOMY;  Surgeon: Ralene Ok, MD;  Location: [facility];  Service: General;  Laterality: N/A;  . CORONARY ANGIOPLASTY     2 1990  . KNEE SURGERY Left yrs ago   arthroscopic  . ROTATOR CUFF REPAIR Left yrs ago  . SHOULDER OPEN ROTATOR CUFF REPAIR Right 04/23/2013   Procedure: RIGHT SHOULDER ROTATOR CUFF REPAIR WITH GRAFT AND ANCHORS ;  Surgeon: Tobi Bastos, MD;  Location[facility];  Service: Orthopedics;  Laterality: Right;  . TONSILLECTOMY      MEDICATIONS: . acetaminophen (TYLENOL) 500 MG tablet  . allopurinol (ZYLOPRIM) 100 MG tablet  . aspirin EC 81 MG tablet  . atorvastatin (LIPITOR) 40 MG tablet  . Cholecalciferol (VITAMIN D3) 50 MCG  (2000 UT) TABS  . diltiazem (TIAZAC) 420 MG 24 hr capsule  . finasteride (PROSCAR) 5 MG tablet  . fluticasone (FLONASE) 50 MCG/ACT nasal spray  . furosemide (LASIX) 40 MG tablet  . insulin aspart protamine- aspart (NOVOLOG MIX 70/30) (70-30) 100 UNIT/ML injection  . losartan (COZAAR) 100 MG tablet  . terazosin (HYTRIN) 10 MG capsule   No current facility-administered medications for this encounter.     Maia Plan [facility] Pre-Surgical Testing [phone] 05/05/19  9:48 AM

## 2019-05-02 ENCOUNTER — Other Ambulatory Visit (HOSPITAL_COMMUNITY)
Admission: RE | Admit: 2019-05-02 | Discharge: 2019-05-02 | Disposition: A | Discharge status: Home / Self Care | Payer: Medicare Other | Source: Ambulatory Visit | Attending: Orthopedic Surgery | Admitting: Orthopedic Surgery

## 2019-05-02 DIAGNOSIS — Z20828 Contact with and (suspected) exposure to other viral communicable diseases: Secondary | ICD-10-CM | POA: Diagnosis not present

## 2019-05-02 DIAGNOSIS — Z01812 Encounter for preprocedural laboratory examination: Secondary | ICD-10-CM | POA: Insufficient documentation

## 2019-05-03 LAB — NOVEL CORONAVIRUS, NAA (HOSP ORDER, SEND-OUT TO REF LAB; TAT 18-24 HRS): SARS-CoV-2, NAA: NOT DETECTED

## 2019-05-05 ENCOUNTER — Telehealth (HOSPITAL_COMMUNITY): Payer: Self-pay | Admitting: *Deleted

## 2019-05-06 ENCOUNTER — Encounter (HOSPITAL_COMMUNITY): Payer: Self-pay | Admitting: *Deleted

## 2019-05-06 ENCOUNTER — Inpatient Hospital Stay (HOSPITAL_COMMUNITY): Payer: Medicare Other

## 2019-05-06 ENCOUNTER — Other Ambulatory Visit: Payer: Self-pay

## 2019-05-06 ENCOUNTER — Inpatient Hospital Stay (HOSPITAL_COMMUNITY)
Admission: RE | Admit: 2019-05-06 | Discharge: 2019-05-07 | DRG: 470 | Disposition: A | Discharge status: Home / Self Care | Payer: Medicare Other | Attending: Orthopedic Surgery | Admitting: Orthopedic Surgery

## 2019-05-06 ENCOUNTER — Encounter (HOSPITAL_COMMUNITY)
Admission: RE | Disposition: A | Discharge status: Home / Self Care | Payer: Self-pay | Source: Home / Self Care | Attending: Orthopedic Surgery

## 2019-05-06 ENCOUNTER — Inpatient Hospital Stay (HOSPITAL_COMMUNITY): Payer: Medicare Other | Admitting: Certified Registered"

## 2019-05-06 ENCOUNTER — Inpatient Hospital Stay (HOSPITAL_COMMUNITY): Payer: Medicare Other | Admitting: Vascular Surgery

## 2019-05-06 DIAGNOSIS — M1611 Unilateral primary osteoarthritis, right hip: Secondary | ICD-10-CM | POA: Diagnosis present

## 2019-05-06 DIAGNOSIS — E785 Hyperlipidemia, unspecified: Secondary | ICD-10-CM | POA: Diagnosis present

## 2019-05-06 DIAGNOSIS — I252 Old myocardial infarction: Secondary | ICD-10-CM

## 2019-05-06 DIAGNOSIS — I2510 Atherosclerotic heart disease of native coronary artery without angina pectoris: Secondary | ICD-10-CM | POA: Diagnosis present

## 2019-05-06 DIAGNOSIS — Z419 Encounter for procedure for purposes other than remedying health state, unspecified: Secondary | ICD-10-CM

## 2019-05-06 DIAGNOSIS — Z79899 Other long term (current) drug therapy: Secondary | ICD-10-CM

## 2019-05-06 DIAGNOSIS — Z20828 Contact with and (suspected) exposure to other viral communicable diseases: Secondary | ICD-10-CM | POA: Diagnosis present

## 2019-05-06 DIAGNOSIS — Z7951 Long term (current) use of inhaled steroids: Secondary | ICD-10-CM | POA: Diagnosis not present

## 2019-05-06 DIAGNOSIS — I129 Hypertensive chronic kidney disease with stage 1 through stage 4 chronic kidney disease, or unspecified chronic kidney disease: Secondary | ICD-10-CM | POA: Diagnosis present

## 2019-05-06 DIAGNOSIS — N189 Chronic kidney disease, unspecified: Secondary | ICD-10-CM | POA: Diagnosis present

## 2019-05-06 DIAGNOSIS — E1122 Type 2 diabetes mellitus with diabetic chronic kidney disease: Secondary | ICD-10-CM | POA: Diagnosis present

## 2019-05-06 DIAGNOSIS — E7800 Pure hypercholesterolemia, unspecified: Secondary | ICD-10-CM | POA: Diagnosis present

## 2019-05-06 DIAGNOSIS — Z96649 Presence of unspecified artificial hip joint: Secondary | ICD-10-CM

## 2019-05-06 DIAGNOSIS — Z794 Long term (current) use of insulin: Secondary | ICD-10-CM | POA: Diagnosis not present

## 2019-05-06 DIAGNOSIS — Z7982 Long term (current) use of aspirin: Secondary | ICD-10-CM | POA: Diagnosis not present

## 2019-05-06 DIAGNOSIS — E669 Obesity, unspecified: Secondary | ICD-10-CM | POA: Diagnosis present

## 2019-05-06 DIAGNOSIS — Z96641 Presence of right artificial hip joint: Secondary | ICD-10-CM

## 2019-05-06 DIAGNOSIS — Z87891 Personal history of nicotine dependence: Secondary | ICD-10-CM | POA: Diagnosis not present

## 2019-05-06 DIAGNOSIS — M109 Gout, unspecified: Secondary | ICD-10-CM | POA: Diagnosis present

## 2019-05-06 HISTORY — PX: TOTAL HIP ARTHROPLASTY: SHX124

## 2019-05-06 LAB — TYPE AND SCREEN
ABO/RH(D): A NEG
Antibody Screen: NEGATIVE

## 2019-05-06 LAB — GLUCOSE, CAPILLARY
Glucose-Capillary: 137 mg/dL — ABNORMAL HIGH (ref 70–99)
Glucose-Capillary: 163 mg/dL — ABNORMAL HIGH (ref 70–99)
Glucose-Capillary: 439 mg/dL — ABNORMAL HIGH (ref 70–99)
Glucose-Capillary: 451 mg/dL — ABNORMAL HIGH (ref 70–99)

## 2019-05-06 SURGERY — ARTHROPLASTY, HIP, TOTAL, ANTERIOR APPROACH
Anesthesia: Spinal | Site: Hip | Laterality: Right

## 2019-05-06 MED ORDER — STERILE WATER FOR IRRIGATION IR SOLN
Status: DC | PRN
  Administered 2019-05-06: 1000 mL

## 2019-05-06 MED ORDER — MEPERIDINE HCL 50 MG/ML IJ SOLN: 6.2500 mg | INTRAMUSCULAR | Status: DC | PRN

## 2019-05-06 MED ORDER — FERROUS SULFATE 325 (65 FE) MG PO TABS
325.0000 mg | ORAL_TABLET | Freq: Three times a day (TID) | ORAL | Status: DC
  Administered 2019-05-06 – 2019-05-07 (×3): 325 mg via ORAL
  Filled 2019-05-06 (×3): qty 1

## 2019-05-06 MED ORDER — INSULIN ASPART 100 UNIT/ML ~~LOC~~ SOLN
0.0000 [IU] | Freq: Three times a day (TID) | SUBCUTANEOUS | Status: DC
  Administered 2019-05-06: 23:00:00 15 [IU] via SUBCUTANEOUS
  Administered 2019-05-07 (×2): 5 [IU] via SUBCUTANEOUS

## 2019-05-06 MED ORDER — ONDANSETRON HCL 4 MG PO TABS: 4.0000 mg | ORAL_TABLET | Freq: Four times a day (QID) | ORAL | Status: DC | PRN

## 2019-05-06 MED ORDER — FENTANYL CITRATE (PF) 100 MCG/2ML IJ SOLN
INTRAMUSCULAR | Status: AC
  Filled 2019-05-06: qty 2

## 2019-05-06 MED ORDER — PROPOFOL 500 MG/50ML IV EMUL
INTRAVENOUS | Status: DC | PRN
  Administered 2019-05-06: 75 ug/kg/min via INTRAVENOUS

## 2019-05-06 MED ORDER — ALLOPURINOL 100 MG PO TABS
100.0000 mg | ORAL_TABLET | Freq: Every evening | ORAL | Status: DC
  Administered 2019-05-06: 100 mg via ORAL
  Filled 2019-05-06 (×2): qty 1

## 2019-05-06 MED ORDER — METOCLOPRAMIDE HCL 5 MG/ML IJ SOLN: 5.0000 mg | Freq: Three times a day (TID) | INTRAMUSCULAR | Status: DC | PRN

## 2019-05-06 MED ORDER — EPHEDRINE SULFATE-NACL 50-0.9 MG/10ML-% IV SOSY
PREFILLED_SYRINGE | INTRAVENOUS | Status: DC | PRN
  Administered 2019-05-06 (×4): 10 mg via INTRAVENOUS

## 2019-05-06 MED ORDER — FENTANYL CITRATE (PF) 100 MCG/2ML IJ SOLN
INTRAMUSCULAR | Status: DC | PRN
  Administered 2019-05-06 (×2): 50 ug via INTRAVENOUS

## 2019-05-06 MED ORDER — INSULIN ASPART PROT & ASPART (70-30 MIX) 100 UNIT/ML ~~LOC~~ SUSP: 20.0000 [IU] | Freq: Two times a day (BID) | SUBCUTANEOUS | Status: DC | PRN

## 2019-05-06 MED ORDER — SODIUM CHLORIDE 0.9 % IR SOLN
Status: DC | PRN
  Administered 2019-05-06: 1000 mL

## 2019-05-06 MED ORDER — FINASTERIDE 5 MG PO TABS
5.0000 mg | ORAL_TABLET | Freq: Every evening | ORAL | Status: DC
  Administered 2019-05-06: 5 mg via ORAL
  Filled 2019-05-06: qty 1

## 2019-05-06 MED ORDER — PHENOL 1.4 % MT LIQD: 1.0000 | OROMUCOSAL | Status: DC | PRN

## 2019-05-06 MED ORDER — HYDROMORPHONE HCL 1 MG/ML IJ SOLN: 0.2500 mg | INTRAMUSCULAR | Status: DC | PRN

## 2019-05-06 MED ORDER — CEFAZOLIN SODIUM-DEXTROSE 2-4 GM/100ML-% IV SOLN
2.0000 g | INTRAVENOUS | Status: AC
  Administered 2019-05-06: 2 g via INTRAVENOUS
  Filled 2019-05-06: qty 100

## 2019-05-06 MED ORDER — BUPIVACAINE IN DEXTROSE 0.75-8.25 % IT SOLN
INTRATHECAL | Status: DC | PRN
  Administered 2019-05-06: 1.6 mL via INTRATHECAL

## 2019-05-06 MED ORDER — ATORVASTATIN CALCIUM 40 MG PO TABS
40.0000 mg | ORAL_TABLET | Freq: Every day | ORAL | Status: DC
  Administered 2019-05-07: 09:00:00 40 mg via ORAL
  Filled 2019-05-06: qty 1

## 2019-05-06 MED ORDER — ACETAMINOPHEN 10 MG/ML IV SOLN: 1000.0000 mg | Freq: Once | INTRAVENOUS | Status: DC | PRN

## 2019-05-06 MED ORDER — MORPHINE SULFATE (PF) 2 MG/ML IV SOLN
0.5000 mg | INTRAVENOUS | Status: DC | PRN
  Administered 2019-05-06 (×2): 1 mg via INTRAVENOUS
  Filled 2019-05-06 (×2): qty 1

## 2019-05-06 MED ORDER — DOCUSATE SODIUM 100 MG PO CAPS
100.0000 mg | ORAL_CAPSULE | Freq: Two times a day (BID) | ORAL | Status: DC
  Administered 2019-05-06 – 2019-05-07 (×2): 100 mg via ORAL
  Filled 2019-05-06 (×2): qty 1

## 2019-05-06 MED ORDER — PROMETHAZINE HCL 25 MG/ML IJ SOLN: 6.2500 mg | INTRAMUSCULAR | Status: DC | PRN

## 2019-05-06 MED ORDER — TRAMADOL HCL 50 MG PO TABS: 50.0000 mg | ORAL_TABLET | Freq: Two times a day (BID) | ORAL | Status: DC | PRN

## 2019-05-06 MED ORDER — DILTIAZEM HCL ER BEADS 300 MG PO CP24: 420.0000 mg | ORAL_CAPSULE | Freq: Every day | ORAL | Status: DC

## 2019-05-06 MED ORDER — FLUTICASONE PROPIONATE 50 MCG/ACT NA SUSP: 1.0000 | Freq: Every day | NASAL | Status: DC | PRN

## 2019-05-06 MED ORDER — ONDANSETRON HCL 4 MG/2ML IJ SOLN
INTRAMUSCULAR | Status: AC
  Filled 2019-05-06: qty 2

## 2019-05-06 MED ORDER — CHLORHEXIDINE GLUCONATE 4 % EX LIQD: 60.0000 mL | Freq: Once | CUTANEOUS | Status: DC

## 2019-05-06 MED ORDER — LIDOCAINE 2% (20 MG/ML) 5 ML SYRINGE
INTRAMUSCULAR | Status: AC
  Filled 2019-05-06: qty 5

## 2019-05-06 MED ORDER — METHOCARBAMOL 500 MG IVPB - SIMPLE MED
500.0000 mg | Freq: Four times a day (QID) | INTRAVENOUS | Status: DC | PRN
  Filled 2019-05-06: qty 50

## 2019-05-06 MED ORDER — LIDOCAINE 2% (20 MG/ML) 5 ML SYRINGE
INTRAMUSCULAR | Status: DC | PRN
  Administered 2019-05-06: 50 mg via INTRAVENOUS

## 2019-05-06 MED ORDER — ONDANSETRON HCL 4 MG/2ML IJ SOLN: 4.0000 mg | Freq: Four times a day (QID) | INTRAMUSCULAR | Status: DC | PRN

## 2019-05-06 MED ORDER — MENTHOL 3 MG MT LOZG: 1.0000 | LOZENGE | OROMUCOSAL | Status: DC | PRN

## 2019-05-06 MED ORDER — MAGNESIUM CITRATE PO SOLN: 1.0000 | Freq: Once | ORAL | Status: DC | PRN

## 2019-05-06 MED ORDER — METHOCARBAMOL 500 MG PO TABS
500.0000 mg | ORAL_TABLET | Freq: Four times a day (QID) | ORAL | Status: DC | PRN
  Administered 2019-05-06 – 2019-05-07 (×4): 500 mg via ORAL
  Filled 2019-05-06 (×4): qty 1

## 2019-05-06 MED ORDER — DEXAMETHASONE SODIUM PHOSPHATE 10 MG/ML IJ SOLN
10.0000 mg | Freq: Once | INTRAMUSCULAR | Status: AC
  Administered 2019-05-06: 4 mg via INTRAVENOUS

## 2019-05-06 MED ORDER — ONDANSETRON HCL 4 MG/2ML IJ SOLN
INTRAMUSCULAR | Status: DC | PRN
  Administered 2019-05-06: 4 mg via INTRAVENOUS

## 2019-05-06 MED ORDER — DEXAMETHASONE SODIUM PHOSPHATE 10 MG/ML IJ SOLN
INTRAMUSCULAR | Status: AC
  Filled 2019-05-06: qty 1

## 2019-05-06 MED ORDER — PROPOFOL 10 MG/ML IV BOLUS
INTRAVENOUS | Status: AC
  Filled 2019-05-06: qty 20

## 2019-05-06 MED ORDER — EPHEDRINE 5 MG/ML INJ
INTRAVENOUS | Status: AC
  Filled 2019-05-06: qty 10

## 2019-05-06 MED ORDER — POLYETHYLENE GLYCOL 3350 17 G PO PACK
17.0000 g | PACK | Freq: Two times a day (BID) | ORAL | Status: DC
  Administered 2019-05-06 – 2019-05-07 (×2): 17 g via ORAL
  Filled 2019-05-06 (×2): qty 1

## 2019-05-06 MED ORDER — DIPHENHYDRAMINE HCL 12.5 MG/5ML PO ELIX: 12.5000 mg | ORAL_SOLUTION | ORAL | Status: DC | PRN

## 2019-05-06 MED ORDER — ALUM & MAG HYDROXIDE-SIMETH 200-200-20 MG/5ML PO SUSP: 15.0000 mL | ORAL | Status: DC | PRN

## 2019-05-06 MED ORDER — PHENYLEPHRINE 40 MCG/ML (10ML) SYRINGE FOR IV PUSH (FOR BLOOD PRESSURE SUPPORT)
PREFILLED_SYRINGE | INTRAVENOUS | Status: AC
  Filled 2019-05-06: qty 10

## 2019-05-06 MED ORDER — BISACODYL 10 MG RE SUPP: 10.0000 mg | Freq: Every day | RECTAL | Status: DC | PRN

## 2019-05-06 MED ORDER — TRANEXAMIC ACID-NACL 1000-0.7 MG/100ML-% IV SOLN
1000.0000 mg | Freq: Once | INTRAVENOUS | Status: AC
  Administered 2019-05-06: 1000 mg via INTRAVENOUS
  Filled 2019-05-06: qty 100

## 2019-05-06 MED ORDER — SODIUM CHLORIDE 0.9 % IV SOLN
INTRAVENOUS | Status: DC
  Administered 2019-05-06 – 2019-05-07 (×2): via INTRAVENOUS

## 2019-05-06 MED ORDER — LACTATED RINGERS IV SOLN
INTRAVENOUS | Status: DC
  Administered 2019-05-06 (×2): via INTRAVENOUS

## 2019-05-06 MED ORDER — LOSARTAN POTASSIUM 50 MG PO TABS
100.0000 mg | ORAL_TABLET | Freq: Every evening | ORAL | Status: DC
  Administered 2019-05-06: 100 mg via ORAL
  Filled 2019-05-06: qty 2

## 2019-05-06 MED ORDER — INSULIN ASPART 100 UNIT/ML ~~LOC~~ SOLN
0.0000 [IU] | Freq: Three times a day (TID) | SUBCUTANEOUS | Status: DC
  Administered 2019-05-06: 3 [IU] via SUBCUTANEOUS

## 2019-05-06 MED ORDER — ACETAMINOPHEN 500 MG PO TABS
500.0000 mg | ORAL_TABLET | Freq: Four times a day (QID) | ORAL | Status: AC
  Administered 2019-05-06 – 2019-05-07 (×4): 500 mg via ORAL
  Filled 2019-05-06 (×4): qty 1

## 2019-05-06 MED ORDER — ASPIRIN 81 MG PO CHEW
81.0000 mg | CHEWABLE_TABLET | Freq: Two times a day (BID) | ORAL | Status: DC
  Administered 2019-05-06 – 2019-05-07 (×2): 81 mg via ORAL
  Filled 2019-05-06 (×2): qty 1

## 2019-05-06 MED ORDER — METOCLOPRAMIDE HCL 5 MG PO TABS: 5.0000 mg | ORAL_TABLET | Freq: Three times a day (TID) | ORAL | Status: DC | PRN

## 2019-05-06 MED ORDER — TERAZOSIN HCL 5 MG PO CAPS
10.0000 mg | ORAL_CAPSULE | Freq: Every day | ORAL | Status: DC
  Administered 2019-05-06: 10 mg via ORAL
  Filled 2019-05-06 (×2): qty 2

## 2019-05-06 MED ORDER — PHENYLEPHRINE 40 MCG/ML (10ML) SYRINGE FOR IV PUSH (FOR BLOOD PRESSURE SUPPORT)
PREFILLED_SYRINGE | INTRAVENOUS | Status: DC | PRN
  Administered 2019-05-06 (×4): 80 ug via INTRAVENOUS

## 2019-05-06 MED ORDER — TRANEXAMIC ACID-NACL 1000-0.7 MG/100ML-% IV SOLN
1000.0000 mg | INTRAVENOUS | Status: AC
  Administered 2019-05-06: 09:00:00 1000 mg via INTRAVENOUS
  Filled 2019-05-06: qty 100

## 2019-05-06 MED ORDER — DILTIAZEM HCL ER COATED BEADS 240 MG PO CP24
420.0000 mg | ORAL_CAPSULE | Freq: Every day | ORAL | Status: DC
  Administered 2019-05-07: 420 mg via ORAL
  Filled 2019-05-06: qty 1

## 2019-05-06 MED ORDER — CEFAZOLIN SODIUM-DEXTROSE 2-4 GM/100ML-% IV SOLN
2.0000 g | Freq: Four times a day (QID) | INTRAVENOUS | Status: AC
  Administered 2019-05-06 (×2): 2 g via INTRAVENOUS
  Filled 2019-05-06 (×3): qty 100

## 2019-05-06 MED ORDER — PROPOFOL 500 MG/50ML IV EMUL
INTRAVENOUS | Status: AC
  Filled 2019-05-06: qty 50

## 2019-05-06 SURGICAL SUPPLY — 48 items
ADH SKN CLS APL DERMABOND .7 (GAUZE/BANDAGES/DRESSINGS) ×1
BAG DECANTER FOR FLEXI CONT (MISCELLANEOUS) IMPLANT
BAG SPEC THK2 15X12 ZIP CLS (MISCELLANEOUS)
BAG ZIPLOCK 12X15 (MISCELLANEOUS) IMPLANT
BLADE SAG 18X100X1.27 (BLADE) ×3 IMPLANT
BLADE SURG SZ10 CARB STEEL (BLADE) ×6 IMPLANT
COVER PERINEAL POST (MISCELLANEOUS) ×3 IMPLANT
COVER SURGICAL LIGHT HANDLE (MISCELLANEOUS) ×3 IMPLANT
COVER WAND RF STERILE (DRAPES) IMPLANT
CUP ACETBLR 52 OD PINNACLE (Hips) ×2 IMPLANT
DERMABOND ADVANCED (GAUZE/BANDAGES/DRESSINGS) ×2
DERMABOND ADVANCED .7 DNX12 (GAUZE/BANDAGES/DRESSINGS) ×1 IMPLANT
DRAPE STERI IOBAN 125X83 (DRAPES) ×3 IMPLANT
DRAPE U-SHAPE 47X51 STRL (DRAPES) ×6 IMPLANT
DRESSING AQUACEL AG SP 3.5X10 (GAUZE/BANDAGES/DRESSINGS) ×1 IMPLANT
DRSG AQUACEL AG SP 3.5X10 (GAUZE/BANDAGES/DRESSINGS) ×3
DURAPREP 26ML APPLICATOR (WOUND CARE) ×3 IMPLANT
ELECT BLADE TIP CTD 4 INCH (ELECTRODE) ×3 IMPLANT
ELECT REM PT RETURN 15FT ADLT (MISCELLANEOUS) ×3 IMPLANT
ELIMINATOR HOLE APEX DEPUY (Hips) ×2 IMPLANT
GLOVE BIO SURGEON STRL SZ 6 (GLOVE) ×6 IMPLANT
GLOVE BIOGEL PI IND STRL 6.5 (GLOVE) ×1 IMPLANT
GLOVE BIOGEL PI IND STRL 7.5 (GLOVE) ×1 IMPLANT
GLOVE BIOGEL PI IND STRL 8.5 (GLOVE) ×1 IMPLANT
GLOVE BIOGEL PI INDICATOR 6.5 (GLOVE) ×2
GLOVE BIOGEL PI INDICATOR 7.5 (GLOVE) ×2
GLOVE BIOGEL PI INDICATOR 8.5 (GLOVE)
GLOVE ECLIPSE 8.0 STRL XLNG CF (GLOVE) ×2 IMPLANT
GLOVE ORTHO TXT STRL SZ7.5 (GLOVE) ×6 IMPLANT
GOWN STRL REUS W/TWL LRG LVL3 (GOWN DISPOSABLE) ×6 IMPLANT
GOWN STRL REUS W/TWL XL LVL3 (GOWN DISPOSABLE) ×3 IMPLANT
HEAD M SROM 36MM PLUS 1.5 (Hips) IMPLANT
HOLDER FOLEY CATH W/STRAP (MISCELLANEOUS) ×3 IMPLANT
KIT TURNOVER KIT A (KITS) IMPLANT
LINER NEUTRAL 52X36MM PLUS 4 (Liner) ×2 IMPLANT
PACK ANTERIOR HIP CUSTOM (KITS) ×3 IMPLANT
SCREW 6.5MMX30MM (Screw) ×2 IMPLANT
SROM M HEAD 36MM PLUS 1.5 (Hips) ×3 IMPLANT
STEM FEM ACTIS STD SZ4 (Stem) ×2 IMPLANT
SUT MNCRL AB 4-0 PS2 18 (SUTURE) ×3 IMPLANT
SUT STRATAFIX 0 PDS 27 VIOLET (SUTURE) ×3
SUT VIC AB 1 CT1 36 (SUTURE) ×9 IMPLANT
SUT VIC AB 2-0 CT1 27 (SUTURE) ×6
SUT VIC AB 2-0 CT1 TAPERPNT 27 (SUTURE) ×2 IMPLANT
SUTURE STRATFX 0 PDS 27 VIOLET (SUTURE) ×1 IMPLANT
TRAY FOLEY MTR SLVR 16FR STAT (SET/KITS/TRAYS/PACK) IMPLANT
WATER STERILE IRR 1000ML POUR (IV SOLUTION) ×5 IMPLANT
YANKAUER SUCT BULB TIP 10FT TU (MISCELLANEOUS) IMPLANT

## 2019-05-06 NOTE — Progress Notes (Signed)
Pts blood sugar was 439 & 451 with no sliding scale coverage available for QHS. Notified on call provider for Dr. Alvan Dame. Received a call back from [facility]. New orders to add sliding scale coverage at bedtimes, moderate scale. New orders implemented and will continue to monitor pts blood sugars.

## 2019-05-06 NOTE — Discharge Instructions (Signed)

## 2019-05-06 NOTE — Op Note (Signed)
NAME:  Robert Lucas                ACCOUNT NO.: [id number]      MEDICAL RECORD NO.: [id number]      FACILITY:  [facility]      PHYSICIAN:  [name]  DATE OF BIRTH:  1940-11-02     DATE OF PROCEDURE:  05/06/2019                                 OPERATIVE REPORT         PREOPERATIVE DIAGNOSIS: Right  hip osteoarthritis.      POSTOPERATIVE DIAGNOSIS:  Right hip osteoarthritis.      PROCEDURE:  Right total hip replacement through an anterior approach   utilizing DePuy THR system, component size 76mm pinnacle cup, a size 36+4 neutral   Altrex liner, a size 4 standard Actis stem with a 36+1.5 Articuleze metal head ball.      SURGEON:  Pietro Cassis. Alvan Dame, M.D.      ASSISTANT:  Griffith Citron, PA-C     ANESTHESIA:  Spinal.      SPECIMENS:  None.      COMPLICATIONS:  None.      BLOOD LOSS:  350 cc     DRAINS:  None.      INDICATION OF THE PROCEDURE:  Robert Lucas is a 78 y.o. male who had   presented to office for evaluation of right hip pain.  Radiographs including MRI of the right hip revealed   progressive degenerative changes with bone-on-bone   articulation of the  hip joint, including subchondral cystic changes and osteophytes.  The patient had painful limited range of   motion significantly affecting their overall quality of life and function.  The patient was failing to    respond to conservative measures including medications and/or injections and activity modification and at this point was ready   to proceed with more definitive measures.  Consent was obtained for   benefit of pain relief.  Specific risks of infection, DVT, component   failure, dislocation, neurovascular injury, and need for revision surgery were reviewed in the office as well discussion of   the anterior versus posterior approach were reviewed.     PROCEDURE IN DETAIL:  The patient was brought to operative theater.   Once adequate anesthesia, preoperative antibiotics, 2 gm  of Ancef, 1 gm of Tranexamic Acid, and 10 mg of Decadron were administered, the patient was positioned supine on the [employer] table.  Once the patient was safely positioned with adequate padding of boney prominences we predraped out the hip, and used fluoroscopy to confirm orientation of the pelvis.      The right hip was then prepped and draped from proximal iliac crest to   mid thigh with a shower curtain technique.      Time-out was performed identifying the patient, planned procedure, and the appropriate extremity.     An incision was then made 2 cm lateral to the   anterior superior iliac spine extending over the orientation of the   tensor fascia lata muscle and sharp dissection was carried down to the   fascia of the muscle.      The fascia was then incised.  The muscle belly was identified and swept   laterally and retractor placed along the superior neck.  Following   cauterization of the circumflex  vessels and removing some pericapsular   fat, a second cobra retractor was placed on the inferior neck.  A T-capsulotomy was made along the line of the   superior neck to the trochanteric fossa, then extended proximally and   distally.  Tag sutures were placed and the retractors were then placed   intracapsular.  We then identified the trochanteric fossa and   orientation of my neck cut and then made a neck osteotomy with the femur on traction.  The femoral   head was removed without difficulty or complication.  Traction was let   off and retractors were placed posterior and anterior around the   acetabulum.      The labrum and foveal tissue were debrided.  I began reaming with a 44 mm   reamer and reamed up to 51 mm reamer with good bony bed preparation and a 52 mm  cup was chosen.  The final 52 mm Pinnacle cup was then impacted under fluoroscopy to confirm the depth of penetration and orientation with respect to   Abduction and forward flexion.  A screw was placed into the ilium  followed by the hole eliminator.  The final   36+4 neutral Altrex liner was impacted with good visualized rim fit.  The cup was positioned anatomically within the acetabular portion of the pelvis.      At this point, the femur was rolled to 100 degrees.  Further capsule was   released off the inferior aspect of the femoral neck.  I then   released the superior capsule proximally.  With the leg in a neutral position the hook was placed laterally   along the femur under the vastus lateralis origin and elevated manually and then held in position using the hook attachment on the bed.  The leg was then extended and adducted with the leg rolled to 100   degrees of external rotation.  Retractors were placed along the medial calcar and posteriorly over the greater trochanter.  Once the proximal femur was fully   exposed, I used a box osteotome to set orientation.  I then began   broaching with the starting chili pepper broach and passed this by hand and then broached up to 4.  With the 4 broach in place I chose a standard offset neck and did several trial reductions.  The offset was appropriate, leg lengths   appeared to be equal best matched with the +1.5 head ball trial confirmed radiographically.   Given these findings, I went ahead and dislocated the hip, repositioned all   retractors and positioned the right hip in the extended and abducted position.  The final 4 Standard Actis stem was   chosen and it was impacted down to the level of neck cut.  Based on this   and the trial reductions, a final 36+1.6 Articuleze metal ball was chosen and   impacted onto a clean and dry trunnion, and the hip was reduced.  The   hip had been irrigated throughout the case again at this point.  I did   reapproximate the superior capsular leaflet to the anterior leaflet   using #1 Vicryl.  The fascia of the   tensor fascia lata muscle was then reapproximated using #1 Vicryl and #0 Stratafix sutures.  The   remaining  wound was closed with 2-0 Vicryl and running 4-0 Monocryl.   The hip was cleaned, dried, and dressed sterilely using Dermabond and   Aquacel dressing.  The patient was then  brought   to recovery room in stable condition tolerating the procedure well.    Griffith Citron, PA-C was present for the entirety of the case involved from   preoperative positioning, perioperative retractor management, general   facilitation of the case, as well as primary wound closure as assistant.            Pietro Cassis Alvan Dame, M.D.        05/06/2019 9:53 AM

## 2019-05-06 NOTE — Interval H&P Note (Signed)
History and Physical Interval Note:  05/06/2019 7:06 AM  Robert Lucas  has presented today for surgery, with the diagnosis of Right hip osteoarthritis.  The various methods of treatment have been discussed with the patient and family. After consideration of risks, benefits and other options for treatment, the patient has consented to  Procedure(s) with comments: TOTAL HIP ARTHROPLASTY ANTERIOR APPROACH (Right) - 70 mins as a surgical intervention.  The patient's history has been reviewed, patient examined, no change in status, stable for surgery.  I have reviewed the patient's chart and labs.  Questions were answered to the patient's satisfaction.     [name]

## 2019-05-06 NOTE — Anesthesia Preprocedure Evaluation (Addendum)
Anesthesia Evaluation  Patient identified by MRN, date of birth, ID band Patient awake    Reviewed: Allergy & Precautions, NPO status , Patient's Chart, lab work & pertinent test results  Airway Mallampati: I       Dental no notable dental hx. (+) Teeth Intact   Pulmonary former smoker,    Pulmonary exam normal breath sounds clear to auscultation       Cardiovascular hypertension, Pt. on medications + CAD and + Past MI  Normal cardiovascular exam Rhythm:Regular Rate:Normal     Neuro/Psych negative psych ROS   GI/Hepatic negative GI ROS, Neg liver ROS,   Endo/Other  diabetes, Type 2, Insulin Dependent  Renal/GU Renal InsufficiencyRenal diseaseCKD     Musculoskeletal   Abdominal Normal abdominal exam  (+)   Peds  Hematology  (+) Blood dyscrasia, anemia ,   Anesthesia Other Findings   Reproductive/Obstetrics                            Anesthesia Physical Anesthesia Plan  ASA: II  Anesthesia Plan: Spinal   Post-op Pain Management:    Induction:   PONV Risk Score and Plan: 2 and Ondansetron, Treatment may vary due to age or medical condition and Propofol infusion  Airway Management Planned: Nasal Cannula, Natural Airway and Simple Face Mask  Additional Equipment: None  Intra-op Plan:   Post-operative Plan:   Informed Consent: I have reviewed the patients History and Physical, chart, labs and discussed the procedure including the risks, benefits and alternatives for the proposed anesthesia with the patient or authorized representative who has indicated his/her understanding and acceptance.       Plan Discussed with: CRNA  Anesthesia Plan Comments:        Anesthesia Quick Evaluation

## 2019-05-06 NOTE — Transfer of Care (Signed)
Immediate Anesthesia Transfer of Care Note  Patient: Robert Lucas  Procedure(s) Performed: TOTAL HIP ARTHROPLASTY ANTERIOR APPROACH (Right Hip)  Patient Location: PACU  Anesthesia Type:Spinal  Level of Consciousness: awake, alert  and oriented  Airway & Oxygen Therapy: Patient Spontanous Breathing and Patient connected to face mask oxygen  Post-op Assessment: Report given to RN and Post -op Vital signs reviewed and stable  Post vital signs: Reviewed and stable  Last Vitals:  Vitals Value Taken Time  BP 143/115 05/06/19 1009  Temp    Pulse 61 05/06/19 1012  Resp 15 05/06/19 1012  SpO2 99 % 05/06/19 1012  Vitals shown include unvalidated device data.  Last Pain:  Vitals:   05/06/19 0637  TempSrc: Oral  PainSc: 4       Patients Stated Pain Goal: 4 ([id number])  Complications: No apparent anesthesia complications

## 2019-05-06 NOTE — Anesthesia Procedure Notes (Signed)
Spinal  Patient location during procedure: OR End time: 05/06/2019 8:30 AM Staffing Resident/CRNA: Williford, Peggy D, CRNA Performed: anesthesiologist and resident/CRNA  Preanesthetic Checklist Completed: patient identified, site marked, surgical consent, pre-op evaluation, timeout performed, IV checked, risks and benefits discussed and monitors and equipment checked Spinal Block Patient position: sitting Prep: DuraPrep Patient monitoring: heart rate, continuous pulse ox and blood pressure Approach: right paramedian Location: L2-3 Injection technique: single-shot Needle Needle type: Sprotte  Needle gauge: 24 G Needle length: 9 cm Assessment Sensory level: T6 Additional Notes Expiration date of kit checked and confirmed. Patient tolerated procedure well, without complications.       

## 2019-05-06 NOTE — Evaluation (Signed)
Physical Therapy Evaluation Patient Details Name: Robert Lucas MRN: [id number] DOB: 12-Sep-1940 Today's Date: 05/06/2019   History of Present Illness  Patient is 78 y.o. male s/p Rt THA anterior approach with PMH significant for OA, DM, HTN, and past MI.  Clinical Impression  Robert Lucas is a 78 y.o. male POD 0 s/p Rt THA anterior approach. Patient reports modified independence with SPC and RW for mobility at baseline. Patient is now limited by functional impairments (see PT problem list below) and requires min assist/guard for transfers and gait with RW. Patient was able to ambulate ~100 feet with RW and minimal cues for safe use of walker during turns. Patient instructed in exercise to facilitate ROM and circulation. Patient will benefit from continued skilled PT interventions to address impairments and progress towards PLOF. Acute PT will follow to progress mobility in preparation for safe discharge home.     Follow Up Recommendations Follow surgeon's recommendation for DC plan and follow-up therapies    Equipment Recommendations  None recommended by PT    Recommendations for Other Services       Precautions / Restrictions Precautions Precautions: Fall Restrictions Weight Bearing Restrictions: No      Mobility  Bed Mobility Overal bed mobility: Needs Assistance Bed Mobility: Supine to Sit     Supine to sit: HOB elevated;Min assist     General bed mobility comments: cues for use of bed rails and assist to bring LE's around to EOB  Transfers Overall transfer level: Needs assistance Equipment used: Rolling walker (2 wheeled) Transfers: Sit to/from Stand Sit to Stand: Min guard         General transfer comment: cues for safe hand placement and technique with RW, pt able to initiate powerup without assist and min guard provided for safety  Ambulation/Gait Ambulation/Gait assistance: Min guard Gait Distance (Feet): 100 Feet Assistive device: Rolling walker (2  wheeled) Gait Pattern/deviations: Step-through pattern;Decreased stance time - right;Decreased step length - right;Decreased stride length;Antalgic;Decreased weight shift to right Gait velocity: decreased   General Gait Details: cues for safe hand placement and safe step sequencing initially, pt maintained good pattern and safe proximity to RW throughout  Stairs            Wheelchair Mobility    Modified Rankin (Stroke Patients Only)       Balance Overall balance assessment: Needs assistance Sitting-balance support: Feet supported;No upper extremity supported Sitting balance-Leahy Scale: Good     Standing balance support: Bilateral upper extremity supported;During functional activity Standing balance-Leahy Scale: Fair                  Pertinent Vitals/Pain Pain Assessment: 0-10 Pain Score: 5  Pain Location: Rt hip Pain Descriptors / Indicators: Aching;Sore Pain Intervention(s): Limited activity within patient's tolerance;Monitored during session;Repositioned;Ice applied    Home Living Family/patient expects to be discharged to:: Private residence Living Arrangements: Spouse/significant other Available Help at Discharge: Family;Available 24 hours/day Type of Home: House(live in duplex in retirement community) Home Access: Level entry     Home Layout: One level Home Equipment: Clinical cytogeneticist - 2 wheels;Cane - single point;Grab bars - tub/shower;Hand held shower head      Prior Function Level of Independence: Independent with assistive device(s)         Comments: pt was using SPC mostly to mobilize and occasionally using RW if pain was too severe     Hand Dominance   Dominant Hand: Right    Extremity/Trunk Assessment   Upper Extremity  Assessment Upper Extremity Assessment: Overall WFL for tasks assessed    Lower Extremity Assessment Lower Extremity Assessment: Overall WFL for tasks assessed;RLE deficits/detail RLE Deficits / Details: pt  with good muscle activation for quad set and heel slide, pt with 4-/5 for quad MMT RLE Sensation: WNL RLE Coordination: WNL    Cervical / Trunk Assessment Cervical / Trunk Assessment: Normal  Communication   Communication: No difficulties  Cognition Arousal/Alertness: Awake/alert Behavior During Therapy: WFL for tasks assessed/performed Overall Cognitive Status: Within Functional Limits for tasks assessed     General Comments      Exercises Total Joint Exercises Ankle Circles/Pumps: AROM;10 reps;Seated;Both Quad Sets: AROM;10 reps;Seated;Right Heel Slides: AAROM;Seated;10 reps;Right   Assessment/Plan    PT Assessment Patient needs continued PT services  PT Problem List Decreased strength;Decreased balance;Decreased mobility;Decreased range of motion;Decreased activity tolerance;Decreased knowledge of use of DME       PT Treatment Interventions DME instruction;Functional mobility training;Balance training;Patient/family education;Modalities;Therapeutic activities;Gait training;Stair training;Therapeutic exercise    PT Goals (Current goals can be found in the Care Plan section)  Acute Rehab PT Goals Patient Stated Goal: to return home and get back independence PT Goal Formulation: With patient Time For Goal Achievement: 05/13/19 Potential to Achieve Goals: Good    Frequency 7X/week    AM-PAC PT "6 Clicks" Mobility  Outcome Measure Help needed turning from your back to your side while in a flat bed without using bedrails?: A Little Help needed moving from lying on your back to sitting on the side of a flat bed without using bedrails?: A Little Help needed moving to and from a bed to a chair (including a wheelchair)?: A Little Help needed standing up from a chair using your arms (e.g., wheelchair or bedside chair)?: A Little Help needed to walk in hospital room?: A Little Help needed climbing 3-5 steps with a railing? : A Little 6 Click Score: 18    End of Session  Equipment Utilized During Treatment: Gait belt Activity Tolerance: Patient tolerated treatment well Patient left: with call bell/phone within reach;in chair;with chair alarm set;with family/visitor present Nurse Communication: Mobility status PT Visit Diagnosis: Muscle weakness (generalized) (M62.81);Difficulty in walking, not elsewhere classified (R26.2)    Time: [id number] PT Time Calculation (min) (ACUTE ONLY): 25 min   Charges:   PT Evaluation $PT Eval Low Complexity: 1 Low PT Treatments $Therapeutic Exercise: 8-22 mins       Kipp Brood, PT, DPT Physical Therapist with [facility]  05/06/2019 4:01 PM

## 2019-05-07 ENCOUNTER — Encounter (HOSPITAL_COMMUNITY): Payer: Self-pay | Admitting: Orthopedic Surgery

## 2019-05-07 DIAGNOSIS — E669 Obesity, unspecified: Secondary | ICD-10-CM | POA: Diagnosis present

## 2019-05-07 LAB — GLUCOSE, CAPILLARY
Glucose-Capillary: 240 mg/dL — ABNORMAL HIGH (ref 70–99)
Glucose-Capillary: 221 mg/dL — ABNORMAL HIGH (ref 70–99)

## 2019-05-07 LAB — BASIC METABOLIC PANEL
Anion gap: 11 (ref 5–15)
BUN: 42 mg/dL — ABNORMAL HIGH (ref 8–23)
CO2: 17 mmol/L — ABNORMAL LOW (ref 22–32)
Calcium: 8.2 mg/dL — ABNORMAL LOW (ref 8.9–10.3)
Chloride: 101 mmol/L (ref 98–111)
Creatinine, Ser: 2.55 mg/dL — ABNORMAL HIGH (ref 0.61–1.24)
GFR calc Af Amer: 26 mL/min — ABNORMAL LOW (ref >60)
GFR calc non Af Amer: 23 mL/min — ABNORMAL LOW (ref >60)
Glucose, Bld: 314 mg/dL — ABNORMAL HIGH (ref 70–99)
Potassium: 4.4 mmol/L (ref 3.5–5.1)
Sodium: 129 mmol/L — ABNORMAL LOW (ref 135–145)

## 2019-05-07 LAB — CBC
HCT: 28.5 % — ABNORMAL LOW (ref 39.0–52.0)
Hemoglobin: 9.5 g/dL — ABNORMAL LOW (ref 13.0–17.0)
MCH: 31.9 pg (ref 26.0–34.0)
MCHC: 33.3 g/dL (ref 30.0–36.0)
MCV: 95.6 fL (ref 80.0–100.0)
Platelets: 156 10*3/uL (ref 150–400)
RBC: 2.98 MIL/uL — ABNORMAL LOW (ref 4.22–5.81)
RDW: 12.3 % (ref 11.5–15.5)
WBC: 10 10*3/uL (ref 4.0–10.5)
nRBC: 0 % (ref 0.0–0.2)

## 2019-05-07 MED ORDER — DOCUSATE SODIUM 100 MG PO CAPS: 100.0000 mg | ORAL_CAPSULE | Freq: Two times a day (BID) | ORAL | 0 refills | Status: DC

## 2019-05-07 MED ORDER — FERROUS SULFATE 325 (65 FE) MG PO TABS: 325.0000 mg | ORAL_TABLET | Freq: Three times a day (TID) | ORAL | 0 refills | Status: DC

## 2019-05-07 MED ORDER — POLYETHYLENE GLYCOL 3350 17 G PO PACK: 17.0000 g | PACK | Freq: Two times a day (BID) | ORAL | 0 refills | Status: DC

## 2019-05-07 MED ORDER — ASPIRIN 81 MG PO CHEW: 81.0000 mg | CHEWABLE_TABLET | Freq: Two times a day (BID) | ORAL | 0 refills | Status: AC

## 2019-05-07 MED ORDER — METHOCARBAMOL 500 MG PO TABS: 500.0000 mg | ORAL_TABLET | Freq: Four times a day (QID) | ORAL | 0 refills | Status: DC | PRN

## 2019-05-07 NOTE — Progress Notes (Addendum)
Physical Therapy Treatment Patient Details Name: Robert Lucas MRN: [id number] DOB: 22-Mar-1941 Today's Date: 05/07/2019    History of Present Illness Patient is 78 y.o. male s/p Rt THA anterior approach with PMH significant for OA, DM, HTN, and past MI.    PT Comments    Progressing well with mobility. Reviewed gait training and exercises. Issued HEP for pt to perform at least 2x/day until surgeon instructs him otherwise. All education completed. Okay to d/c from PT standpoint.    Follow Up Recommendations  Follow surgeon's recommendation for DC plan and follow-up therapies     Equipment Recommendations  None recommended by PT    Recommendations for Other Services       Precautions / Restrictions Precautions Precautions: Fall Restrictions Weight Bearing Restrictions: No    Mobility  Bed Mobility               General bed mobility comments: oob in recliner  Transfers Overall transfer level: Needs assistance Equipment used: Rolling walker (2 wheeled) Transfers: Sit to/from Stand Sit to Stand: Min guard         General transfer comment: for safety.  Ambulation/Gait Ambulation/Gait assistance: Min guard Gait Distance (Feet): 125 Feet Assistive device: Rolling walker (2 wheeled) Gait Pattern/deviations: Step-through pattern;Decreased stride length     General Gait Details: for safety. slow gait speed.    Wheelchair Mobility    Modified Rankin (Stroke Patients Only)       Balance Overall balance assessment: Needs assistance         Standing balance support: Bilateral upper extremity supported Standing balance-Leahy Scale: Poor                              Cognition Arousal/Alertness: Awake/alert Behavior During Therapy: WFL for tasks assessed/performed Overall Cognitive Status: Within Functional Limits for tasks assessed                                        Exercises Total Joint Exercises Ankle  Circles/Pumps: AROM;Both;10 reps;Seated Quad Sets: AROM;Both;10 reps;Seated Heel Slides: AAROM;10 reps;Seated;Right Hip ABduction/ADduction: AAROM;10 reps;Seated;Right;AROM;Standing Knee Flexion: AROM;Right;10 reps;Standing Marching in Standing: AROM;Right;Left;10 reps;Standing General Exercises - Lower Extremity Heel Raises: AROM;Both;10 reps;Standing    General Comments        Pertinent Vitals/Pain Pain Assessment: 0-10 Pain Score: 5  Pain Location: R hip Pain Descriptors / Indicators: Sore;Discomfort;Tightness Pain Intervention(s): Monitored during session;Repositioned;Ice applied    Home Living                      Prior Function            PT Goals (current goals can now be found in the care plan section) Progress towards PT goals: Progressing toward goals    Frequency    7X/week      PT Plan Current plan remains appropriate    Co-evaluation              AM-PAC PT "6 Clicks" Mobility   Outcome Measure  Help needed turning from your back to your side while in a flat bed without using bedrails?: A Little Help needed moving from lying on your back to sitting on the side of a flat bed without using bedrails?: A Little Help needed moving to and from a bed to a chair (including a wheelchair)?: A Little  Help needed standing up from a chair using your arms (e.g., wheelchair or bedside chair)?: A Little Help needed to walk in hospital room?: A Little Help needed climbing 3-5 steps with a railing? : A Little 6 Click Score: 18    End of Session Equipment Utilized During Treatment: Gait belt Activity Tolerance: Patient tolerated treatment well Patient left: in chair;with call bell/phone within reach   PT Visit Diagnosis: Other abnormalities of gait and mobility (R26.89)     Time: [id number] PT Time Calculation (min) (ACUTE ONLY): 23 min  Charges:  $Gait Training: 8-22 mins $Therapeutic Exercise: 8-22 mins                        Weston Anna, [place] Pager: [phone] Office: [phone]

## 2019-05-07 NOTE — Progress Notes (Cosign Needed)
     Subjective: 1 Day Post-Op Procedure(s) (LRB): TOTAL HIP ARTHROPLASTY ANTERIOR APPROACH (Right)   Seen by Dr. Alvan Dame. Patient reports pain as mild, pain controlled.  No events throughout the night.  Patient is is doing well.  Patient ready be discharged home, if he does well with therapy.  Patient follow-up in the clinic in 2 weeks.  Patient is to call with any questions or concerns.     Objective:   VITALS:   Vitals:   05/07/19 0131 05/07/19 0457  BP: 140/73 (!) 147/72  Pulse: 67 70  Resp: 20 18  Temp: 98.3 F (36.8 C) 97.8 F (36.6 C)  SpO2: 99% 99%    Dorsiflexion/Plantar flexion intact Incision: dressing C/D/I No cellulitis present Compartment soft  LABS Recent Labs    05/07/19 0234  HGB 9.5*  HCT 28.5*  WBC 10.0  PLT 156    Recent Labs    05/07/19 0234  NA 129*  K 4.4  BUN 42*  CREATININE 2.55*  GLUCOSE 314*     Assessment/Plan: 1 Day Post-Op Procedure(s) (LRB): TOTAL HIP ARTHROPLASTY ANTERIOR APPROACH (Right) Foley cath d/c'ed Advance diet Up with therapy D/C IV fluids Discharge home Follow up in 2 weeks at [facility] Follow up with OLIN,Kearsten Ginther D in 2 weeks.  Contact information:  [facility] [address], [address] [phone]    Obese (BMI 30-39.9) Estimated body mass index is 30.15 kg/m as calculated from the following:   Height as of this encounter: 5\' 3"  (1.6 m).   Weight as of this encounter: 77.2 kg. Patient also counseled that weight may inhibit the healing process Patient counseled that losing weight will help with future health issues        West Pugh. Kendi Defalco   PAC  05/07/2019, 8:06 AM

## 2019-05-07 NOTE — Progress Notes (Signed)
All discharge instructions were given to the patient. Patient verbalized understanding.  

## 2019-05-08 NOTE — Discharge Summary (Signed)
Physician Discharge Summary  Patient ID: Robert Lucas MRN: [id number] DOB/AGE: [birth year] 78 y.o.  Admit date: 05/06/2019 Discharge date: 05/07/2019   Procedures:  Procedure(s) (LRB): TOTAL HIP ARTHROPLASTY ANTERIOR APPROACH (Right)  Attending Physician:  Dr. Paralee Cancel   Admission Diagnoses:   Right hip primary OA / pain  Discharge Diagnoses:  Principal Problem:   S/P right THA, AA Active Problems:   Obese  Past Medical History:  Diagnosis Date  . Arthritis   . Bell's palsy    one episode  . CKD (chronic kidney disease)    sees [place] in [place]  . Diabetes ([place])    type 2   sees endo at [place] in [place]  . Hypercholesteremia   . Hypertension   . Myocardial infarction ([place]) 1990  . Neuromuscular disorder ([place])     HPI:    Robert Lucas, 78 y.o. male, has a history of pain and functional disability in the right hip(s) due to arthritis and patient has failed non-surgical conservative treatments for greater than 12 weeks to include NSAID's and/or analgesics, corticosteriod injections, supervised PT with diminished ADL's post treatment, use of assistive devices, activity modification and chiropractor.  Onset of symptoms was abrupt starting in March 2020  with rapidlly worsening course since that time.The patient noted no past surgery on the right hip(s).  Patient currently rates pain in the right hip at 9 out of 10 with activity. Patient has night pain, worsening of pain with activity and weight bearing, trendelenberg gait, pain that interfers with activities of daily living and pain with passive range of motion. Patient has evidence of periarticular osteophytes and joint space narrowing by imaging studies. This condition presents safety issues increasing the risk of falls.  There is no current active infection.  Risks, benefits and expectations were discussed with the patient.  Risks including but not limited to the risk of anesthesia, blood clots, nerve damage, blood  vessel damage, failure of the prosthesis, infection and up to and including death.  Patient understand the risks, benefits and expectations and wishes to proceed with surgery.  PCP: [name], NP   Discharged Condition: good  Hospital Course:  Patient underwent the above stated procedure on 05/06/2019. Patient tolerated the procedure well and brought to the recovery room in good condition and subsequently to the floor.  POD #1 BP: 147/72 ; Pulse: 70 ; Temp: 97.8 F (36.6 C) ; Resp: 18 Patient reports pain as mild, pain controlled.  No events throughout the night.  Patient is is doing well.  Patient ready be discharged home. Dorsiflexion/plantar flexion intact, incision: dressing C/D/I, no cellulitis present and compartment soft.   LABS  Basename    HGB     9.5  HCT     28.5    Discharge Exam: General appearance: alert, cooperative and no distress Extremities: Homans sign is negative, no sign of DVT, no edema, redness or tenderness in the calves or thighs and no ulcers, gangrene or trophic changes  Disposition:  Home with follow up in 2 weeks   Follow-up Information    Paralee Cancel, MD. Schedule an appointment as soon as possible for a visit in 2 weeks.   Specialty: Orthopedic Surgery Contact information: [address] [place] [address] [id number]           Discharge Instructions    Call MD / Call 911   Complete by: As directed    If you experience chest pain or shortness of breath,  CALL 911 and be transported to the hospital emergency room.  If you develope a fever above 101 F, pus (white drainage) or increased drainage or redness at the wound, or calf pain, call your surgeon's office.   Change dressing   Complete by: As directed    Maintain surgical dressing until follow up in the clinic. If the edges start to pull up, may reinforce with tape. If the dressing is no longer working, may remove and cover with gauze and tape, but must keep the area  dry and clean.  Call with any questions or concerns.   Constipation Prevention   Complete by: As directed    Drink plenty of fluids.  Prune juice may be helpful.  You may use a stool softener, such as Colace (over the counter) 100 mg twice a day.  Use MiraLax (over the counter) for constipation as needed.   Diet - low sodium heart healthy   Complete by: As directed    Discharge instructions   Complete by: As directed    Maintain surgical dressing until follow up in the clinic. If the edges start to pull up, may reinforce with tape. If the dressing is no longer working, may remove and cover with gauze and tape, but must keep the area dry and clean.  Follow up in 2 weeks at [facility]. Call with any questions or concerns.   Increase activity slowly as tolerated   Complete by: As directed    Weight bearing as tolerated with assist device (walker, cane, etc) as directed, use it as long as suggested by your surgeon or therapist, typically at least 4-6 weeks.   TED hose   Complete by: As directed    Use stockings (TED hose) for 2 weeks on both leg(s).  You may remove them at night for sleeping.      Allergies as of 05/07/2019      Reactions   Lisinopril Cough   Prednisone    Elevates BGL; patient prefers to not take this   Penicillins Rash, Other (See Comments)   Rash around ankles Has patient had a PCN reaction causing immediate rash, facial/tongue/throat swelling, SOB or lightheadedness with hypotension: Yes Has patient had a PCN reaction causing severe rash involving mucus membranes or skin necrosis: Unk Has patient had a PCN reaction that required hospitalization: No Has patient had a PCN reaction occurring within the last 10 years: No If all of the above answers are "NO", then may proceed with Cephalosporin use.      Medication List    STOP taking these medications   aspirin EC 81 MG tablet Replaced by: aspirin 81 MG chewable tablet     TAKE these medications    acetaminophen 500 MG tablet Commonly known as: TYLENOL Take 1,000 mg by mouth every 6 (six) hours as needed for moderate pain or headache.   allopurinol 100 MG tablet Commonly known as: ZYLOPRIM Take 100 mg by mouth every evening.   aspirin 81 MG chewable tablet Commonly known as: Aspirin Childrens Chew 1 tablet (81 mg total) by mouth 2 (two) times daily. Take for 4 weeks, then resume regular dose. Replaces: aspirin EC 81 MG tablet   atorvastatin 40 MG tablet Commonly known as: LIPITOR Take 40 mg by mouth daily.   diltiazem 420 MG 24 hr capsule Commonly known as: TIAZAC Take 420 mg by mouth daily.   docusate sodium 100 MG capsule Commonly known as: Colace Take 1 capsule (100 mg total) by mouth 2 (  two) times daily.   ferrous sulfate 325 (65 FE) MG tablet Commonly known as: FerrouSul Take 1 tablet (325 mg total) by mouth 3 (three) times daily with meals for 14 days.   finasteride 5 MG tablet Commonly known as: PROSCAR Take 5 mg by mouth every evening.   fluticasone 50 MCG/ACT nasal spray Commonly known as: FLONASE Place 1 spray into both nostrils daily as needed for allergies or rhinitis.   furosemide 40 MG tablet Commonly known as: LASIX Take 1 tablet (40 mg total) by mouth daily. What changed: when to take this   insulin aspart protamine- aspart (70-30) 100 UNIT/ML injection Commonly known as: NOVOLOG MIX 70/30 Inject 20 Units into the skin 2 (two) times daily as needed (blood sugar above 140).   losartan 100 MG tablet Commonly known as: COZAAR Take 100 mg by mouth every evening.   methocarbamol 500 MG tablet Commonly known as: Robaxin Take 1 tablet (500 mg total) by mouth every 6 (six) hours as needed for muscle spasms.   polyethylene glycol 17 g packet Commonly known as: MIRALAX / GLYCOLAX Take 17 g by mouth 2 (two) times daily.   terazosin 10 MG capsule Commonly known as: HYTRIN Take 10 mg by mouth at bedtime.   Vitamin D3 50 MCG (2000 UT) Tabs Take  2,000 Units by mouth daily.            Discharge Care Instructions  (From admission, onward)         Start     Ordered   05/07/19 0000  Change dressing    Comments: Maintain surgical dressing until follow up in the clinic. If the edges start to pull up, may reinforce with tape. If the dressing is no longer working, may remove and cover with gauze and tape, but must keep the area dry and clean.  Call with any questions or concerns.   05/07/19 0745           Signed: West Pugh. Dezaria Methot   PA-C  05/08/2019, 8:02 AM

## 2019-05-09 NOTE — Anesthesia Postprocedure Evaluation (Signed)
Anesthesia Post Note  Patient: Robert Lucas  Procedure(s) Performed: TOTAL HIP ARTHROPLASTY ANTERIOR APPROACH (Right Hip)     Patient location during evaluation: PACU Anesthesia Type: Spinal Level of consciousness: awake Pain management: pain level controlled Vital Signs Assessment: post-procedure vital signs reviewed and stable Respiratory status: spontaneous breathing Cardiovascular status: stable Postop Assessment: no headache, no backache, spinal receding, patient able to bend at knees and no apparent nausea or vomiting Anesthetic complications: no    Last Vitals:  Vitals:   05/07/19 0457 05/07/19 0847  BP: (!) 147/72 (!) 151/71  Pulse: 70 62  Resp: 18 16  Temp: 36.6 C 36.5 C  SpO2: 99% 99%    Last Pain:  Vitals:   05/07/19 0910  TempSrc:   PainSc: 4    Pain Goal: Patients Stated Pain Goal: 3 (05/07/19 0550)                 Huston Foley

## 2019-07-17 DIAGNOSIS — M25552 Pain in left hip: Secondary | ICD-10-CM | POA: Insufficient documentation

## 2019-07-31 ENCOUNTER — Encounter: Payer: Self-pay | Admitting: Cardiology

## 2019-07-31 ENCOUNTER — Other Ambulatory Visit: Payer: Self-pay

## 2019-07-31 ENCOUNTER — Ambulatory Visit: Payer: Medicare Other | Admitting: Cardiology

## 2019-07-31 VITALS — BP 174/80 | HR 71 | Temp 98.2°F | Resp 16 | Ht 63.0 in | Wt 165.0 lb

## 2019-07-31 DIAGNOSIS — I2510 Atherosclerotic heart disease of native coronary artery without angina pectoris: Secondary | ICD-10-CM | POA: Diagnosis not present

## 2019-07-31 DIAGNOSIS — I10 Essential (primary) hypertension: Secondary | ICD-10-CM | POA: Diagnosis not present

## 2019-07-31 NOTE — Progress Notes (Signed)
Patient referred by Jonathon Jordan, MD for coronary artery disease  Subjective:   Robert Lucas, male    DOB: 1941-05-16, 79 y.o.   MRN: [id number]   Chief Complaint  Patient presents with  . Coronary Artery Disease    6 month Follow up   HPI  79 y.o. Caucasian male with with hypertension, hyperlipidemia, IDDM, CAD s/p anterior MI in 1990s treated wiuth TPA and PTCA, CKD 4, obesity, h/o gout, OSA  Patient underwent right hip replacement in 05/2019.  His major complaint at this time is lower back pain radiating to his left leg, along with left leg weakness.  He reports that he is in a lot of pain due to this. He has appointment with orthopedic surgeon Dr. Alvan Dame on 1/29.  From cardiac standpoint, he denies any chest pain, shortness of breath. A significantly improved.  Lasix was stopped by his nephrologist.  Initial consultation HPI 01/2020: Patient was previously followed by Dr. Wynonia Lawman for 29 years. He has not had any recurrent MI since his 39, when he has PTCA to his LAD. Subsequent echocardiograms and stress test, most recently in 2014, have all been reassuring. In April 2020, he started developing right hip pain for which he is seeing [facility].  He has been given treatment with prednisone, with minimal relief.  His walking is limited, due to hip pain.  At this point, there is no plan for surgery.  Prior to April 2020,p atient used to be more active, playing golf regularly, without chest pain, shortness of breath, palpitations, orthopnea, PND, TIA/syncope.  He has had stable leg edema for long time.  He does have CKD stage IV with a last known creatinine of 2.4, for which he sees nephrologist at [facility].  He currently is on Lasix 20 mg once daily.   Current Outpatient Medications on File Prior to Visit  Medication Sig Dispense Refill  . acetaminophen (TYLENOL) 500 MG tablet Take 1,000 mg by mouth every 6 (six) hours as needed for moderate pain or headache.     . allopurinol  (ZYLOPRIM) 100 MG tablet Take 100 mg by mouth every evening.     Marland Kitchen aspirin EC 81 MG tablet Take 81 mg by mouth daily.    Marland Kitchen atorvastatin (LIPITOR) 40 MG tablet Take 40 mg by mouth daily.    . calcitRIOL (ROCALTROL) 0.25 MCG capsule Take 0.25 mcg by mouth 2 (two) times a week.    . diltiazem (TIAZAC) 420 MG 24 hr capsule Take 420 mg by mouth daily.    . ferrous sulfate (FERROUSUL) 325 (65 FE) MG tablet Take 1 tablet (325 mg total) by mouth 3 (three) times daily with meals for 14 days. (Patient taking differently: Take 325 mg by mouth 2 (two) times a week. ) 42 tablet 0  . finasteride (PROSCAR) 5 MG tablet Take 5 mg by mouth daily.    . fluticasone (FLONASE) 50 MCG/ACT nasal spray Place 1 spray into both nostrils daily as needed for allergies or rhinitis.    Marland Kitchen insulin aspart protamine- aspart (NOVOLOG MIX 70/30) (70-30) 100 UNIT/ML injection Inject 20 Units into the skin 2 (two) times daily as needed (blood sugar above 140).     Marland Kitchen losartan (COZAAR) 100 MG tablet Take 100 mg by mouth every evening.     . terazosin (HYTRIN) 10 MG capsule Take 10 mg by mouth at bedtime.     . traMADol (ULTRAM) 50 MG tablet Take 50 mg by mouth at bedtime as needed.  No current facility-administered medications on file prior to visit.    Cardiovascular studies:  EKG 07/31/2019: Sinus rhythm 61 bpm. Old anterior infarct.  Nonspecific ST depression.   Echocardiogram 02/05/2019: Left ventricle cavity is normal in size. Mild concentric hypertrophy of the left ventricle. Normal LV systolic function with EF 55%. Normal global wall motion. Doppler evidence of grade I (impaired) diastolic dysfunction, normal LAP.  Left atrial cavity is mildly dilated. Trace aortic valve stenosis. Mild (Grade I) mitral regurgitation. Inadequate TR jet to estimate pulmonary artery systolic pressure. Normal right atrial pressure.  No significant change compared to previous study in 2014.   Sinus rhythm 61 bpm. Old anterior infarct.   Nonspecific ST depression.   Treadmill Cardiolite stress test 2014: Results not available  Cath 1990: Normal Left main, 99% stenosis proximal LAD, no significant disease CFX RCA, PTCA of LAD done   Recent labs: 05/07/2019: Glucose 314, BUN/Cr 42/2.55. EGFR 23. Na/K 129/4.4.  H/H 9.5/28.5. MCV 95. Platelets 156 HbA1C 6.7%   Review of Systems  Cardiovascular: Negative for chest pain, dyspnea on exertion, leg swelling, palpitations and syncope.  Musculoskeletal: Positive for back pain (Radiating to left leg.  Also has left leg weakness.).        Vitals:   07/31/19 0943 07/31/19 1012  BP: (!) 184/83 (!) 174/80  Pulse: 70 71  Resp: 16   Temp: 98.2 F (36.8 C)   SpO2: 97%      Body mass index is 29.23 kg/m. Filed Weights   07/31/19 0943  Weight: 165 lb (74.8 kg)     Objective:   Physical Exam  Constitutional: He appears well-developed and well-nourished.  Neck: No JVD present.  Cardiovascular: Normal rate, regular rhythm, normal heart sounds and intact distal pulses.  No murmur heard. Pulmonary/Chest: Effort normal and breath sounds normal. He has no wheezes. He has no rales.  Musculoskeletal:        General: No edema.  Nursing note and vitals reviewed.         Assessment & Recommendations:   79 y.o. Caucasian male with with hypertension, hyperlipidemia, IDDM, CAD s/p anterior MI in 55s treated wiuth TPA and PTCA, CKD 4, obesity, h/o gout, OSA  1. Coronary artery disease involving native coronary artery of native heart without angina pectoris No angina symptoms. Continue Aspirin, statin, diltiazem.  2. Leg edema: Resolved.  3.  Hypertension: Not controlled.  I attribute this to his ongoing back pain.  No changes made to his antihypertensive therapy today.  4.  Preoperative stratification: His current complaint is low back pain and radiculopathy to left leg.  He is currently being managed conservatively.  Should he need any operative management, his  overall cardiac risk is low.  F/u in 6 months  Ervie Mccard Esther Hardy, MD [facility] [place] Pager: [phone] Office: [phone] If no answer Cell [phone]

## 2019-08-01 ENCOUNTER — Encounter: Payer: Self-pay | Admitting: Cardiology

## 2019-08-27 DIAGNOSIS — M5136 Other intervertebral disc degeneration, lumbar region: Secondary | ICD-10-CM | POA: Insufficient documentation

## 2019-09-08 DIAGNOSIS — T819XXA Unspecified complication of procedure, initial encounter: Secondary | ICD-10-CM | POA: Insufficient documentation

## 2019-09-08 DIAGNOSIS — M5416 Radiculopathy, lumbar region: Secondary | ICD-10-CM | POA: Insufficient documentation

## 2019-09-11 ENCOUNTER — Ambulatory Visit: Payer: Medicare Other | Admitting: Neurology

## 2019-09-11 ENCOUNTER — Other Ambulatory Visit: Payer: Self-pay

## 2019-09-11 ENCOUNTER — Ambulatory Visit (INDEPENDENT_AMBULATORY_CARE_PROVIDER_SITE_OTHER): Payer: Medicare Other | Admitting: Neurology

## 2019-09-11 DIAGNOSIS — Z0289 Encounter for other administrative examinations: Secondary | ICD-10-CM

## 2019-09-11 DIAGNOSIS — G609 Hereditary and idiopathic neuropathy, unspecified: Secondary | ICD-10-CM

## 2019-09-11 DIAGNOSIS — M5417 Radiculopathy, lumbosacral region: Secondary | ICD-10-CM | POA: Diagnosis not present

## 2019-09-11 DIAGNOSIS — M5416 Radiculopathy, lumbar region: Secondary | ICD-10-CM

## 2019-09-11 NOTE — Progress Notes (Signed)
Full Name: Robert Lucas Gender: Male MRN #: [id number] Date of Birth: [birth year]    Visit Date: 09/11/2019 07:40 Age: 79 Years Examining Physician: Sarina Ill, MD  Referring Physician: Melina Schools, MD  History: Patient with left leg weakness  Summary: EMG/NCS was performed on the bilateral lower extremities.  The left peroneal motor nerve showed reduced amplitude (0.9 mV, normal greater than 2).  The right peroneal motor nerve showed reduced amplitude (0.5 mV, normal greater than 2).  The left tibial motor nerve showed reduced amplitude (2 mV, normal greater than 4) and decreased velocity (33 m/s, normal greater than 41).  The right tibial motor nerve showed reduced amplitude (2.8 mV, normal greater than 2) and decreased velocity (38 m/s, normal greater than 41).  The right sural sensory nerve showed reduced amplitude (5 V, normal greater than 6).  The left superficial peroneal sensory nerve showed reduced amplitude (3 V, normal greater than 6).  The right superficial peroneal sensory nerve showed reduced amplitude (4 V, normal greater than 6). All remaining nerves (as indicated in the following tables) were within normal limits.  The left vastus medialis and the left adductor longus showed increased spontaneous activity (3+ positive sharp waves).  Patient could not activate the left vastus medialis.  The left extensor hallucis longus muscle showed diminished motor unit recruitment. Could not perform EMG needle examination on the lumbar paraspinals as they are unreliable after surgery. All remaining muscles (as indicated in the following tables) were within normal limits.       Conclusion:  1.There is acute/ongoing denervation in 2 muscles that share L2-L4 innervation but not in an L2-L3 muscle indicative of left L4 radiculopathy.  Could not perform EMG needle examination on the lumbar paraspinals as they are unreliable after surgery.   2. Sensory conduction amplitudes were  slightly reduced however given his age these values may be normal.  Patient denies any sensory changes in his feet.  3. Motor conduction amplitudes were slightly decreased which could be due to remote L5-S1 radiculopathy.     Sarina Ill M.D.  [facility] [place], [place] [address] Tel: [phone] Fax: [phone]         [facility]    Nerve / Sites Muscle Latency Ref. Amplitude Ref. Rel Amp Segments Distance Velocity Ref. Area    ms ms mV mV %  cm m/s m/s mVms  L Peroneal - EDB     Ankle EDB 6.3 ?6.5 0.9 ?2.0 100 Ankle - EDB 9   4.0     Fib head EDB 12.0  0.8  93.7 Fib head - Ankle 25 44 ?44 4.3     Pop fossa EDB 14.3  0.7  89.1 Pop fossa - Fib head 10 44 ?44 4.2         Pop fossa - Ankle      R Peroneal - EDB     Ankle EDB 4.8 ?6.5 0.5 ?2.0 100 Ankle - EDB 9   1.2     Fib head EDB 10.5  0.4  74.6 Fib head - Ankle 25 44 ?44 0.7     Pop fossa EDB 12.8  0.3  92.3 Pop fossa - Fib head 10 44 ?44 0.9         Pop fossa - Ankle      L Tibial - AH     Ankle AH 4.3 ?5.8 2.0 ?4.0 100 Ankle - AH 9   4.4  Pop fossa AH 14.9  1.3  66 Pop fossa - Ankle 35 33 ?41 5.3  R Tibial - AH     Ankle AH 3.8 ?5.8 2.8 ?4.0 100 Ankle - AH 9   5.5     Pop fossa AH 12.9  1.4  49.3 Pop fossa - Ankle 35 38 ?41 3.6             SNC    Nerve / Sites Rec. Site Peak Lat Ref.  Amp Ref. Segments Distance    ms ms V V  cm  L Sural - Ankle (Calf)     Calf Ankle 3.8 ?4.4 6 ?6 Calf - Ankle 14  R Sural - Ankle (Calf)     Calf Ankle 3.5 ?4.4 5 ?6 Calf - Ankle 14  L Superficial peroneal - Ankle     Lat leg Ankle 4.4 ?4.4 3 ?6 Lat leg - Ankle 14  R Superficial peroneal - Ankle     Lat leg Ankle 4.4 ?4.4 4 ?6 Lat leg - Ankle 14             F  Wave    Nerve F Lat Ref.   ms ms  L Tibial - AH 55.1 ?56.0  R Tibial - AH 55.4 ?56.0         EMG Summary Table    Spontaneous MUAP Recruitment  Muscle IA Fib PSW Fasc Other Amp Dur. Poly Pattern  L. Iliopsoas Normal None None None  _______ Normal Normal Normal Normal  L. Vastus medialis Normal None 3+ None _______ Normal Normal Normal Absent  L. Tibialis anterior Normal None None None _______ Normal Normal Normal Normal  L. Gastrocnemius (Medial head) Normal None None None _______ Normal Normal Normal Normal  L. Tibialis posterior Normal None None None _______ Normal Normal Normal Normal  L. Extensor hallucis longus Normal None None None _______ Normal Normal Normal Reduced  R. Vastus medialis Normal None None None _______ Normal Normal Normal Normal  R. Iliopsoas Normal None None None _______ Normal Normal Normal Normal  L. Adductor longus Normal None 3+ None _______ Normal Normal Normal Normal  L. Biceps femoris (long head) Normal None None None _______ Normal Normal Normal Normal  L. Gluteus maximus Normal None None None _______ Normal Normal Normal Normal  L. Gluteus medius Normal None None None _______ Normal Normal Normal Normal

## 2019-09-11 NOTE — Progress Notes (Signed)
See procedure note.

## 2019-09-15 NOTE — Procedures (Signed)
Full Name: Robert Lucas Gender: Male MRN #: [id number] Date of Birth: 1940-07-20    Visit Date: 09/11/2019 07:40 Age: 79 Years Examining Physician: Sarina Ill, MD  Referring Physician: Melina Schools, MD  History: Patient with left leg weakness  Summary: EMG/NCS was performed on the bilateral lower extremities.  The left peroneal motor nerve showed reduced amplitude (0.9 mV, normal greater than 2).  The right peroneal motor nerve showed reduced amplitude (0.5 mV, normal greater than 2).  The left tibial motor nerve showed reduced amplitude (2 mV, normal greater than 4) and decreased velocity (33 m/s, normal greater than 41).  The right tibial motor nerve showed reduced amplitude (2.8 mV, normal greater than 2) and decreased velocity (38 m/s, normal greater than 41).  The right sural sensory nerve showed reduced amplitude (5 V, normal greater than 6).  The left superficial peroneal sensory nerve showed reduced amplitude (3 V, normal greater than 6).  The right superficial peroneal sensory nerve showed reduced amplitude (4 V, normal greater than 6). All remaining nerves (as indicated in the following tables) were within normal limits.  The left vastus medialis and the left adductor longus showed increased spontaneous activity (3+ positive sharp waves).  Patient could not activate the left vastus medialis.  The left extensor hallucis longus muscle showed diminished motor unit recruitment. Could not perform EMG needle examination on the lumbar paraspinals as they are unreliable after surgery. All remaining muscles (as indicated in the following tables) were within normal limits.       Conclusion:  1.There is acute/ongoing denervation in 2 muscles that share L2-L4 innervation but not in an L2-L3 muscle indicative of left L4 radiculopathy.    2. Sensory conduction amplitudes were slightly reduced however given his age these values may be normal.  Patient denies any sensory changes in his  feet.  3. Motor conduction amplitudes were slightly decreased which could be due to remote L5-S1 radiculopathy.     Sarina Ill M.D.  [facility] [place], [place] [address] Tel: [phone] Fax: [phone]         [facility]    Nerve / Sites Muscle Latency Ref. Amplitude Ref. Rel Amp Segments Distance Velocity Ref. Area    ms ms mV mV %  cm m/s m/s mVms  L Peroneal - EDB     Ankle EDB 6.3 ?6.5 0.9 ?2.0 100 Ankle - EDB 9   4.0     Fib head EDB 12.0  0.8  93.7 Fib head - Ankle 25 44 ?44 4.3     Pop fossa EDB 14.3  0.7  89.1 Pop fossa - Fib head 10 44 ?44 4.2         Pop fossa - Ankle      R Peroneal - EDB     Ankle EDB 4.8 ?6.5 0.5 ?2.0 100 Ankle - EDB 9   1.2     Fib head EDB 10.5  0.4  74.6 Fib head - Ankle 25 44 ?44 0.7     Pop fossa EDB 12.8  0.3  92.3 Pop fossa - Fib head 10 44 ?44 0.9         Pop fossa - Ankle      L Tibial - AH     Ankle AH 4.3 ?5.8 2.0 ?4.0 100 Ankle - AH 9   4.4     Pop fossa AH 14.9  1.3  66 Pop fossa - Ankle 35  33 ?41 5.3  R Tibial - AH     Ankle AH 3.8 ?5.8 2.8 ?4.0 100 Ankle - AH 9   5.5     Pop fossa AH 12.9  1.4  49.3 Pop fossa - Ankle 35 38 ?41 3.6             SNC    Nerve / Sites Rec. Site Peak Lat Ref.  Amp Ref. Segments Distance    ms ms V V  cm  L Sural - Ankle (Calf)     Calf Ankle 3.8 ?4.4 6 ?6 Calf - Ankle 14  R Sural - Ankle (Calf)     Calf Ankle 3.5 ?4.4 5 ?6 Calf - Ankle 14  L Superficial peroneal - Ankle     Lat leg Ankle 4.4 ?4.4 3 ?6 Lat leg - Ankle 14  R Superficial peroneal - Ankle     Lat leg Ankle 4.4 ?4.4 4 ?6 Lat leg - Ankle 14             F  Wave    Nerve F Lat Ref.   ms ms  L Tibial - AH 55.1 ?56.0  R Tibial - AH 55.4 ?56.0         EMG Summary Table    Spontaneous MUAP Recruitment  Muscle IA Fib PSW Fasc Other Amp Dur. Poly Pattern  L. Iliopsoas Normal None None None _______ Normal Normal Normal Normal  L. Vastus medialis Normal None 3+ None _______ Normal Normal Normal Absent   L. Tibialis anterior Normal None None None _______ Normal Normal Normal Normal  L. Gastrocnemius (Medial head) Normal None None None _______ Normal Normal Normal Normal  L. Tibialis posterior Normal None None None _______ Normal Normal Normal Normal  L. Extensor hallucis longus Normal None None None _______ Normal Normal Normal Reduced  R. Vastus medialis Normal None None None _______ Normal Normal Normal Normal  R. Iliopsoas Normal None None None _______ Normal Normal Normal Normal  L. Adductor longus Normal None 3+ None _______ Normal Normal Normal Normal  L. Biceps femoris (long head) Normal None None None _______ Normal Normal Normal Normal  L. Gluteus maximus Normal None None None _______ Normal Normal Normal Normal  L. Gluteus medius Normal None None None _______ Normal Normal Normal Normal

## 2020-01-20 ENCOUNTER — Other Ambulatory Visit: Payer: Self-pay

## 2020-01-20 ENCOUNTER — Ambulatory Visit: Payer: Medicare Other | Admitting: Cardiology

## 2020-01-20 ENCOUNTER — Encounter: Payer: Self-pay | Admitting: Cardiology

## 2020-01-20 VITALS — BP 140/76 | HR 72 | Ht 63.0 in | Wt 173.0 lb

## 2020-01-20 DIAGNOSIS — I10 Essential (primary) hypertension: Secondary | ICD-10-CM

## 2020-01-20 DIAGNOSIS — I2510 Atherosclerotic heart disease of native coronary artery without angina pectoris: Secondary | ICD-10-CM

## 2020-01-20 NOTE — Progress Notes (Signed)
Patient referred by [name], NP for coronary artery disease  Subjective:   Robert Lucas, male    DOB: 05-17-1941, 79 y.o.   MRN: [id number]   Chief Complaint  Patient presents with  . Coronary Artery Disease  . Follow-up   HPI  79 y.o. Caucasian male with with hypertension, hyperlipidemia, IDDM, CAD s/p anterior MI in 90s treated wiuth TPA and PTCA, CKD 4, obesity, h/o gout, OSA  Patient denies chest pain, shortness of breath, palpitations, orthopnea, PND, TIA/syncope. Leg edema is well controlled with lasix.    Initial consultation HPI 01/2020: Patient was previously followed by Dr. Wynonia Lawman for 29 years. He has not had any recurrent MI since his 27, when he has PTCA to his LAD. Subsequent echocardiograms and stress test, most recently in 2014, have all been reassuring. In April 2020, he started developing right hip pain for which he is seeing [facility].  He has been given treatment with prednisone, with minimal relief.  His walking is limited, due to hip pain.  At this point, there is no plan for surgery.  Prior to April 2020,p atient used to be more active, playing golf regularly, without chest pain, shortness of breath, palpitations, orthopnea, PND, TIA/syncope.  He has had stable leg edema for long time.  He does have CKD stage IV with a last known creatinine of 2.4, for which he sees nephrologist at [facility].  He currently is on Lasix 20 mg once daily.   Current Outpatient Medications on File Prior to Visit  Medication Sig Dispense Refill  . acetaminophen (TYLENOL) 500 MG tablet Take 1,000 mg by mouth every 6 (six) hours as needed for moderate pain or headache.     . allopurinol (ZYLOPRIM) 100 MG tablet Take 100 mg by mouth every evening.     Marland Kitchen aspirin EC 81 MG tablet Take 81 mg by mouth daily.    Marland Kitchen atorvastatin (LIPITOR) 40 MG tablet Take 40 mg by mouth daily.    . calcitRIOL (ROCALTROL) 0.25 MCG capsule Take 0.25 mcg by mouth 2 (two) times a week.    . diltiazem  (TIAZAC) 420 MG 24 hr capsule Take 420 mg by mouth daily.    . ferrous sulfate (FERROUSUL) 325 (65 FE) MG tablet Take 1 tablet (325 mg total) by mouth 3 (three) times daily with meals for 14 days. (Patient taking differently: Take 325 mg by mouth 2 (two) times a week. ) 42 tablet 0  . finasteride (PROSCAR) 5 MG tablet Take 5 mg by mouth daily.    . fluticasone (FLONASE) 50 MCG/ACT nasal spray Place 1 spray into both nostrils daily as needed for allergies or rhinitis.    Marland Kitchen insulin aspart protamine- aspart (NOVOLOG MIX 70/30) (70-30) 100 UNIT/ML injection Inject 20 Units into the skin 2 (two) times daily as needed (blood sugar above 140).     Marland Kitchen losartan (COZAAR) 100 MG tablet Take 100 mg by mouth every evening.     . terazosin (HYTRIN) 10 MG capsule Take 10 mg by mouth at bedtime.     . traMADol (ULTRAM) 50 MG tablet Take 50 mg by mouth at bedtime as needed.     No current facility-administered medications on file prior to visit.    Cardiovascular studies:  EKG 01/20/2020: Sinus rhythm 61 bpm Old anterior infarct    Echocardiogram 02/05/2019: Left ventricle cavity is normal in size. Mild concentric hypertrophy of the left ventricle. Normal LV systolic function with EF 55%. Normal global wall motion.  Doppler evidence of grade I (impaired) diastolic dysfunction, normal LAP.  Left atrial cavity is mildly dilated. Trace aortic valve stenosis. Mild (Grade I) mitral regurgitation. Inadequate TR jet to estimate pulmonary artery systolic pressure. Normal right atrial pressure.  No significant change compared to previous study in 2014.   Treadmill Cardiolite stress test 2014: Results not available  Cath 1990: Normal Left main, 99% stenosis proximal LAD, no significant disease CFX RCA, PTCA of LAD done   Recent labs: 08/15/2019: Glucose 151, BUN/Cr 40/2.3. EGFR 23.  HbA1C 6.9% Chol 135, TG 119, HDL 42, LDL 72 TSH 2.2 normal  05/07/2019: Glucose 314, BUN/Cr 42/2.55. EGFR 23. Na/K 129/4.4.   H/H 9.5/28.5. MCV 95. Platelets 156 HbA1C 6.7%   Review of Systems  Cardiovascular: Negative for chest pain, dyspnea on exertion, leg swelling, palpitations and syncope.  Musculoskeletal: Positive for back pain (Radiating to left leg.  Also has left leg weakness.).        Vitals:   01/20/20 1456  BP: 140/76  Pulse: 72  SpO2: 97%     Body mass index is 30.65 kg/m. Filed Weights   01/20/20 1456  Weight: 173 lb (78.5 kg)     Objective:   Physical Exam Vitals and nursing note reviewed.  Constitutional:      Appearance: He is well-developed.  Neck:     Vascular: No JVD.  Cardiovascular:     Rate and Rhythm: Normal rate and regular rhythm.     Pulses: Intact distal pulses.     Heart sounds: Normal heart sounds. No murmur heard.   Pulmonary:     Effort: Pulmonary effort is normal.     Breath sounds: Normal breath sounds. No wheezing or rales.  Musculoskeletal:     Right lower leg: Edema (Trace) present.     Left lower leg: Edema (Trace) present.           Assessment & Recommendations:   79 y.o. Caucasian male with with hypertension, hyperlipidemia, IDDM, CAD s/p anterior MI in 67s treated wiuth TPA and PTCA, CKD 4, obesity, h/o gout, OSA  Coronary artery disease involving native coronary artery of native heart without angina pectoris No angina symptoms. Continue Aspirin, statin, diltiazem.  Hypertension: Fairly well controlled.  F/u in 1 year  Nigel Mormon, MD [facility] Pager: [phone] Office: [phone] If no answer Cell [phone]

## 2020-01-23 ENCOUNTER — Ambulatory Visit: Payer: Medicare Other | Admitting: Cardiology

## 2020-01-29 ENCOUNTER — Ambulatory Visit: Payer: Medicare Other | Admitting: Cardiology

## 2020-07-06 DIAGNOSIS — J069 Acute upper respiratory infection, unspecified: Secondary | ICD-10-CM | POA: Diagnosis not present

## 2020-07-08 ENCOUNTER — Emergency Department (HOSPITAL_BASED_OUTPATIENT_CLINIC_OR_DEPARTMENT_OTHER): Payer: Medicare Other

## 2020-07-08 ENCOUNTER — Other Ambulatory Visit: Payer: Self-pay

## 2020-07-08 ENCOUNTER — Inpatient Hospital Stay (HOSPITAL_BASED_OUTPATIENT_CLINIC_OR_DEPARTMENT_OTHER)
Admission: EM | Admit: 2020-07-08 | Discharge: 2020-07-11 | DRG: 193 | Disposition: A | Discharge status: Home / Self Care | Payer: Medicare Other | Attending: Internal Medicine | Admitting: Internal Medicine

## 2020-07-08 ENCOUNTER — Encounter (HOSPITAL_BASED_OUTPATIENT_CLINIC_OR_DEPARTMENT_OTHER): Payer: Self-pay

## 2020-07-08 DIAGNOSIS — N400 Enlarged prostate without lower urinary tract symptoms: Secondary | ICD-10-CM | POA: Diagnosis present

## 2020-07-08 DIAGNOSIS — Z87891 Personal history of nicotine dependence: Secondary | ICD-10-CM | POA: Diagnosis not present

## 2020-07-08 DIAGNOSIS — N184 Chronic kidney disease, stage 4 (severe): Secondary | ICD-10-CM | POA: Diagnosis not present

## 2020-07-08 DIAGNOSIS — Z9861 Coronary angioplasty status: Secondary | ICD-10-CM | POA: Diagnosis not present

## 2020-07-08 DIAGNOSIS — R911 Solitary pulmonary nodule: Secondary | ICD-10-CM | POA: Diagnosis not present

## 2020-07-08 DIAGNOSIS — Z880 Allergy status to penicillin: Secondary | ICD-10-CM

## 2020-07-08 DIAGNOSIS — I10 Essential (primary) hypertension: Secondary | ICD-10-CM | POA: Diagnosis not present

## 2020-07-08 DIAGNOSIS — I5023 Acute on chronic systolic (congestive) heart failure: Secondary | ICD-10-CM | POA: Diagnosis present

## 2020-07-08 DIAGNOSIS — R0602 Shortness of breath: Secondary | ICD-10-CM | POA: Diagnosis not present

## 2020-07-08 DIAGNOSIS — J189 Pneumonia, unspecified organism: Secondary | ICD-10-CM | POA: Diagnosis not present

## 2020-07-08 DIAGNOSIS — Z96641 Presence of right artificial hip joint: Secondary | ICD-10-CM | POA: Diagnosis present

## 2020-07-08 DIAGNOSIS — Z794 Long term (current) use of insulin: Secondary | ICD-10-CM

## 2020-07-08 DIAGNOSIS — N179 Acute kidney failure, unspecified: Secondary | ICD-10-CM

## 2020-07-08 DIAGNOSIS — I252 Old myocardial infarction: Secondary | ICD-10-CM | POA: Diagnosis not present

## 2020-07-08 DIAGNOSIS — Z888 Allergy status to other drugs, medicaments and biological substances status: Secondary | ICD-10-CM

## 2020-07-08 DIAGNOSIS — Z79899 Other long term (current) drug therapy: Secondary | ICD-10-CM | POA: Diagnosis not present

## 2020-07-08 DIAGNOSIS — E7800 Pure hypercholesterolemia, unspecified: Secondary | ICD-10-CM | POA: Diagnosis not present

## 2020-07-08 DIAGNOSIS — I5021 Acute systolic (congestive) heart failure: Secondary | ICD-10-CM | POA: Diagnosis not present

## 2020-07-08 DIAGNOSIS — E1122 Type 2 diabetes mellitus with diabetic chronic kidney disease: Secondary | ICD-10-CM | POA: Diagnosis not present

## 2020-07-08 DIAGNOSIS — I2510 Atherosclerotic heart disease of native coronary artery without angina pectoris: Secondary | ICD-10-CM | POA: Diagnosis not present

## 2020-07-08 DIAGNOSIS — J9811 Atelectasis: Secondary | ICD-10-CM | POA: Diagnosis not present

## 2020-07-08 DIAGNOSIS — J181 Lobar pneumonia, unspecified organism: Secondary | ICD-10-CM | POA: Diagnosis present

## 2020-07-08 DIAGNOSIS — Z885 Allergy status to narcotic agent status: Secondary | ICD-10-CM | POA: Diagnosis not present

## 2020-07-08 DIAGNOSIS — Z20822 Contact with and (suspected) exposure to covid-19: Secondary | ICD-10-CM | POA: Diagnosis not present

## 2020-07-08 DIAGNOSIS — I130 Hypertensive heart and chronic kidney disease with heart failure and stage 1 through stage 4 chronic kidney disease, or unspecified chronic kidney disease: Secondary | ICD-10-CM | POA: Diagnosis present

## 2020-07-08 DIAGNOSIS — R0601 Orthopnea: Secondary | ICD-10-CM | POA: Diagnosis not present

## 2020-07-08 DIAGNOSIS — Z7982 Long term (current) use of aspirin: Secondary | ICD-10-CM

## 2020-07-08 DIAGNOSIS — M109 Gout, unspecified: Secondary | ICD-10-CM | POA: Diagnosis present

## 2020-07-08 DIAGNOSIS — E119 Type 2 diabetes mellitus without complications: Secondary | ICD-10-CM | POA: Diagnosis not present

## 2020-07-08 DIAGNOSIS — I509 Heart failure, unspecified: Secondary | ICD-10-CM | POA: Diagnosis not present

## 2020-07-08 DIAGNOSIS — E1129 Type 2 diabetes mellitus with other diabetic kidney complication: Secondary | ICD-10-CM | POA: Diagnosis not present

## 2020-07-08 DIAGNOSIS — J90 Pleural effusion, not elsewhere classified: Secondary | ICD-10-CM | POA: Diagnosis not present

## 2020-07-08 LAB — RESP PANEL BY RT-PCR (FLU A&B, COVID) ARPGX2
Influenza A by PCR: NEGATIVE
Influenza B by PCR: NEGATIVE
SARS Coronavirus 2 by RT PCR: NEGATIVE

## 2020-07-08 LAB — CBC
HCT: 29.8 % — ABNORMAL LOW (ref 39.0–52.0)
Hemoglobin: 10.4 g/dL — ABNORMAL LOW (ref 13.0–17.0)
MCH: 32.4 pg (ref 26.0–34.0)
MCHC: 34.9 g/dL (ref 30.0–36.0)
MCV: 92.8 fL (ref 80.0–100.0)
Platelets: 177 10*3/uL (ref 150–400)
RBC: 3.21 MIL/uL — ABNORMAL LOW (ref 4.22–5.81)
RDW: 13.2 % (ref 11.5–15.5)
WBC: 6.6 10*3/uL (ref 4.0–10.5)
nRBC: 0 % (ref 0.0–0.2)

## 2020-07-08 LAB — BASIC METABOLIC PANEL
Anion gap: 10 (ref 5–15)
BUN: 45 mg/dL — ABNORMAL HIGH (ref 8–23)
CO2: 16 mmol/L — ABNORMAL LOW (ref 22–32)
Calcium: 8.4 mg/dL — ABNORMAL LOW (ref 8.9–10.3)
Chloride: 107 mmol/L (ref 98–111)
Creatinine, Ser: 3.52 mg/dL — ABNORMAL HIGH (ref 0.61–1.24)
GFR, Estimated: 17 mL/min — ABNORMAL LOW (ref >60)
Glucose, Bld: 207 mg/dL — ABNORMAL HIGH (ref 70–99)
Potassium: 4.3 mmol/L (ref 3.5–5.1)
Sodium: 133 mmol/L — ABNORMAL LOW (ref 135–145)

## 2020-07-08 LAB — BRAIN NATRIURETIC PEPTIDE: B Natriuretic Peptide: 398.7 pg/mL — ABNORMAL HIGH (ref 0.0–100.0)

## 2020-07-08 MED ORDER — FUROSEMIDE 10 MG/ML IJ SOLN
40.0000 mg | Freq: Once | INTRAMUSCULAR | Status: AC
  Administered 2020-07-08: 40 mg via INTRAVENOUS
  Filled 2020-07-08: qty 4

## 2020-07-08 MED ORDER — DOXYCYCLINE HYCLATE 100 MG PO TABS
100.0000 mg | ORAL_TABLET | Freq: Once | ORAL | Status: AC
  Administered 2020-07-08: 100 mg via ORAL
  Filled 2020-07-08: qty 1

## 2020-07-08 MED ORDER — ALBUTEROL SULFATE HFA 108 (90 BASE) MCG/ACT IN AERS
4.0000 | INHALATION_SPRAY | Freq: Once | RESPIRATORY_TRACT | Status: AC
  Administered 2020-07-08: 4 via RESPIRATORY_TRACT
  Filled 2020-07-08: qty 6.7

## 2020-07-08 MED ORDER — CEFTRIAXONE SODIUM 1 G IJ SOLR
1.0000 g | Freq: Once | INTRAMUSCULAR | Status: AC
  Administered 2020-07-08: 1 g via INTRAVENOUS
  Filled 2020-07-08: qty 10

## 2020-07-08 NOTE — ED Provider Notes (Signed)
[facility] Provider Note   CSN: [id number] Arrival date & time: 07/08/20  1825     History Chief Complaint  Patient presents with  . Shortness of Breath    Robert Lucas is a 80 y.o. male.  HPI 80 year old male with a history of CKD, DM type II, hypercholesteremia, hypertension, MI, CHF with an unknown EF presents to the ER with complaints of cough and shortness of breath x2 days.  [facility].  States he developed a cough over the last few days, nonproductive.  He was seen by his PCP and was started on Levaquin.  He is currently on day 3 of his antibiotics, however shortness of breath continues to get worse.  It is worsened with laying down.  He denies any chest pain.  No nausea, vomiting or abdominal pain.  He is vaccinated and boosted for COVID-19.  Feels like his shortness of breath is continuing to get worse, he called his PCP who was told to come to the ER.  Has not noticed any worsening lower extremity edema.  Has noted that he has been wheezing quite a bit, no history of COPD.    Past Medical History:  Diagnosis Date  . Arthritis   . Bell's palsy    one episode  . CKD (chronic kidney disease)    sees [place] in [place]  . Diabetes ([place])    type 2   sees endo at [facility] in [place]  . Hypercholesteremia   . Hypertension   . Myocardial infarction ([place]) 1990  . Neuromuscular disorder [facility])     Patient Active Problem List   Diagnosis Date Noted  . Obese 05/07/2019  . S/P right THA, AA 05/06/2019  . Coronary artery disease involving native coronary artery of native heart without angina pectoris 01/28/2019  . Leg edema 01/28/2019  . Complete tear of rotator cuff 04/23/2013  . Chest pain 10/13/2012  . Diabetes mellitus ([place]) 10/13/2012  . Essential hypertension 10/13/2012  . Hyperlipidemia 10/13/2012  . Gout 10/13/2012  . Bell's palsy 10/13/2012  . Weakness 10/13/2012    Past Surgical History:  Procedure Laterality  Date  . BACK SURGERY  yrs ago   lower  . CARDIAC CATHETERIZATION    . CHOLECYSTECTOMY N/A 05/02/2017   Procedure: LAPAROSCOPIC CHOLECYSTECTOMY;  Surgeon: Ralene Ok, MD;  Location: [facility];  Service: General;  Laterality: N/A;  . CORONARY ANGIOPLASTY     2 1990  . KNEE SURGERY Left yrs ago   arthroscopic  . ROTATOR CUFF REPAIR Left yrs ago  . SHOULDER OPEN ROTATOR CUFF REPAIR Right 04/23/2013   Procedure: RIGHT SHOULDER ROTATOR CUFF REPAIR WITH GRAFT AND ANCHORS ;  Surgeon: Tobi Bastos, MD;  Location: [facility];  Service: Orthopedics;  Laterality: Right;  . TONSILLECTOMY    . TOTAL HIP ARTHROPLASTY Right 05/06/2019   Procedure: TOTAL HIP ARTHROPLASTY ANTERIOR APPROACH;  Surgeon: Paralee Cancel, MD;  Location: [facility];  Service: Orthopedics;  Laterality: Right;  70 mins       Family History  Problem Relation Age of Onset  . Dementia Mother   . Stroke Brother     Social History   Tobacco Use  . Smoking status: Former Smoker    Years: 10.00    Types: Cigars    Quit date: 07/03/1988    Years since quitting: 32.0  . Smokeless tobacco: Never Used  Vaping Use  . Vaping Use: Never used  Substance Use Topics  .  Alcohol use: Not Currently    Comment: occasional beer  . Drug use: No    Home Medications Prior to Admission medications   Medication Sig Start Date End Date Taking? Authorizing Provider  acetaminophen (TYLENOL) 500 MG tablet Take 1,000 mg by mouth every 6 (six) hours as needed for moderate pain or headache.     [provider]  allopurinol (ZYLOPRIM) 100 MG tablet Take 100 mg by mouth every evening.     [provider]  aspirin EC 81 MG tablet Take 81 mg by mouth daily.    [provider]  atorvastatin (LIPITOR) 40 MG tablet Take 40 mg by mouth daily.    [provider]  calcitRIOL (ROCALTROL) 0.25 MCG capsule Take 0.25 mcg by mouth 2 (two) times a week.    [provider]  diltiazem (TIAZAC) 420 MG 24 hr capsule Take  420 mg by mouth daily.    [provider]  ferrous sulfate (FERROUSUL) 325 (65 FE) MG tablet Take 1 tablet (325 mg total) by mouth 3 (three) times daily with meals for 14 days. Patient taking differently: Take 325 mg by mouth 2 (two) times a week.  05/07/19 07/30/19  Danae Orleans, PA-C  finasteride (PROSCAR) 5 MG tablet Take 5 mg by mouth daily.    [provider]  fluticasone (FLONASE) 50 MCG/ACT nasal spray Place 1 spray into both nostrils daily as needed for allergies or rhinitis.    [provider]  furosemide (LASIX) 40 MG tablet Take 40 mg by mouth daily.    [provider]  insulin aspart protamine- aspart (NOVOLOG MIX 70/30) (70-30) 100 UNIT/ML injection Inject 20 Units into the skin 2 (two) times daily as needed (blood sugar above 140).     [provider]  losartan (COZAAR) 100 MG tablet Take 100 mg by mouth every evening.     [provider]  terazosin (HYTRIN) 10 MG capsule Take 10 mg by mouth at bedtime.     [provider]  traMADol (ULTRAM) 50 MG tablet Take 50 mg by mouth at bedtime as needed. 03/13/19   [provider]    Allergies    Lisinopril, Prednisone, and Penicillins  Review of Systems   Review of Systems  Constitutional: Positive for chills. Negative for fever.  HENT: Negative for ear pain and sore throat.   Eyes: Negative for pain and visual disturbance.  Respiratory: Positive for cough, shortness of breath and wheezing.   Cardiovascular: Negative for chest pain, palpitations and leg swelling.  Gastrointestinal: Negative for abdominal pain and vomiting.  Genitourinary: Negative for dysuria and hematuria.  Musculoskeletal: Negative for arthralgias and back pain.  Skin: Negative for color change and rash.  Neurological: Negative for seizures and syncope.  All other systems reviewed and are negative.   Physical Exam Updated Vital Signs BP (!) 158/80   Pulse 76   Temp 97.6 F (36.4 C)  (Oral)   Resp 19   Ht 5\' 3"  (1.6 m)   Wt 79.4 kg   SpO2 97%   BMI 31.00 kg/m   Physical Exam Vitals and nursing note reviewed.  Constitutional:      Appearance: He is well-developed and well-nourished. He is obese. He is ill-appearing.  HENT:     Head: Normocephalic and atraumatic.  Eyes:     Conjunctiva/sclera: Conjunctivae normal.  Cardiovascular:     Rate and Rhythm: Normal rate and regular rhythm.     Heart sounds: No murmur heard.  Pulmonary:     Effort: Pulmonary effort is normal. No respiratory distress.     Breath sounds: Decreased breath sounds and wheezing present.     Comments: Lung sounds decreased throughout the lower lobes, audible wheezes, tachypneic with a rate between 23 and 25. Chest:     Chest wall: No tenderness.  Abdominal:     Palpations: Abdomen is soft.     Tenderness: There is no abdominal tenderness.  Musculoskeletal:        General: No edema.     Cervical back: Neck supple.     Right lower leg: No tenderness. No edema.     Left lower leg: No tenderness. No edema.     Comments: No pitting edema noted to lower extremities  Skin:    General: Skin is warm and dry.  Neurological:     General: No focal deficit present.     Mental Status: He is alert.  Psychiatric:        Mood and Affect: Mood and affect and mood normal.        Behavior: Behavior normal.     ED Results / Procedures / Treatments   Labs (all labs ordered are listed, but only abnormal results are displayed) Labs Reviewed  BASIC METABOLIC PANEL - Abnormal; Notable for the following components:      Result Value   Sodium 133 (*)    CO2 16 (*)    Glucose, Bld 207 (*)    BUN 45 (*)    Creatinine, Ser 3.52 (*)    Calcium 8.4 (*)    GFR, Estimated 17 (*)    All other components within normal limits  CBC - Abnormal; Notable for the following components:   RBC 3.21 (*)    Hemoglobin 10.4 (*)    HCT 29.8 (*)    All other components within normal limits  BRAIN NATRIURETIC  PEPTIDE - Abnormal; Notable for the following components:   B Natriuretic Peptide 398.7 (*)    All other components within normal limits  SARS CORONAVIRUS 2 (TAT 6-24 HRS)  RESP PANEL BY RT-PCR (FLU A&B, COVID) [id number]    EKG None  Radiology DG Chest 2 View  Result Date: 07/08/2020 CLINICAL DATA:  Short of breath for 3 days, hypertension, diabetes EXAM: CHEST - 2 VIEW COMPARISON:  11/15/2017 FINDINGS: Frontal and lateral views of the chest demonstrate a stable cardiac silhouette. There is left lower lobe airspace disease with small left pleural effusion, consistent with underlying pneumonia. Right chest is clear. No pneumothorax. No acute bony abnormalities. IMPRESSION: 1. Left lower lobe consolidation compatible with pneumonia. 2. Small left parapneumonic effusion. Electronically Signed   By: Randa Ngo M.D.   On: 07/08/2020 19:03   CT Chest Wo Contrast  Result Date: 07/08/2020 CLINICAL DATA:  Short of breath for 4 days, left lower lobe pneumonia EXAM: CT CHEST WITHOUT CONTRAST TECHNIQUE: Multidetector CT imaging of the chest was performed following the standard protocol without IV contrast. COMPARISON:  07/08/2020 FINDINGS: Cardiovascular: Unenhanced images of the heart demonstrate trace pericardial effusion. The heart is not enlarged. There is extensive atherosclerosis of the coronary vasculature. The thoracic aorta is normal in caliber, with diffuse atherosclerosis. Mediastinum/Nodes: No enlarged mediastinal or axillary lymph nodes. Thyroid gland, trachea, and esophagus demonstrate no significant findings. Lungs/Pleura: There are bilateral pleural effusions, less than 1 L each. There are areas of dependent atelectasis within the bilateral lower lobes, with likely superimposed airspace disease seen within the non dependent left lower lobe. Minimal  patchy ground-glass airspace disease is seen within the right apex. No evidence of pneumothorax. 5 mm right middle lobe pulmonary nodule image 76/5.  No other pulmonary nodules or masses. The central airways are patent. Upper Abdomen: No acute abnormality. Musculoskeletal: No acute or destructive bony lesions. Reconstructed images demonstrate progressive spondylosis at the T8/T9 level, with complete loss of disc space, eburnation, and circumferential osteophyte formation. 4 mm of anterolisthesis of T8 on T9 has developed in the interim. IMPRESSION: 1. Bilateral pleural effusions, with mild compressive atelectasis of the dependent lower lobes. 2. Consolidation within the non dependent left lower lobe compatible with airspace disease and pneumonia. Patchy ground-glass right upper lobe airspace disease also likely infectious. 3. 5 mm right middle lobe pulmonary nodule. No follow-up needed if patient is low-risk. Non-contrast chest CT can be considered in 12 months if patient is high-risk. This recommendation follows the consensus statement: Guidelines for Management of Incidental Pulmonary Nodules Detected on CT Images: From the Fleischner Society 2017; Radiology 2017; 284:228-243. 4. Progressive spondylosis at T8/T9. 5. Aortic Atherosclerosis (ICD10-I70.0). Coronary artery atherosclerosis. Electronically Signed   By: Randa Ngo M.D.   On: 07/08/2020 20:42    Procedures Procedures (including critical care time)  Medications Ordered in ED Medications  albuterol (VENTOLIN HFA) 108 (90 Base) MCG/ACT inhaler 4 puff (4 puffs Inhalation Given 07/08/20 2019)  cefTRIAXone (ROCEPHIN) 1 g in sodium chloride 0.9 % 100 mL IVPB (1 g Intravenous New Bag/Given 07/08/20 2043)  doxycycline (VIBRA-TABS) tablet 100 mg (100 mg Oral Given 07/08/20 2038)  furosemide (LASIX) injection 40 mg (40 mg Intravenous Given 07/08/20 2134)    ED Course  I have reviewed the triage vital signs and the nursing notes.  Pertinent labs & imaging results that were available during my care of the patient were reviewed by me and considered in my medical decision making (see chart for  details).    MDM Rules/Calculators/A&P                         80 year old male with worsening shortness of breath and cough over the last few days.  On arrival, he is tachypneic, with audible wheezes throughout.  Lung sounds decreased in the lower lobes bilaterally.  He is afebrile, not tachycardic or hypoxic.   Labs ordered, reviewed and interpreted by me.  His BMP shows mild hyponatremia, glucose of 207, creatinine of 3.52 and a GFR 17.  Chart review is limited to values from a year ago, November 2020 values show a creatinine of 2.55.  Question AKI.  Hemoglobin of 10.4 appears to be stable.  BNP of 398, no prior values to compare this to.  EKG with normal sinus rhythm.  Covid test is pending.  Imaging ordered, reviewed and interpreted by me.  Chest x-ray shows left lower lobe consolidation consistent with pneumonia.  As per discussion with my supervising physician Dr. Vallery Ridge, even significant wheezes, there is slight concern for PE.  Patient's renal function however will not allow a PE study.  CT of the chest confirms left lower lobe pneumonia.  Covid test is still pending.  Patient treated in the ER with Rocephin and doxycycline.  Was given several puffs of albuterol.  On reevaluation, patient is still tachypneic and wheezing.  Consulted the hospitalist, will admit for AKI, pneumonia, likely requiring IV antibiotics given not no improvement on oral antibiotics.  Will need close monitoring.  Spoke with Dr. Clearence Ped who will accept the patient for hospital admission.  Covid  test is pending.  Patient mains hemodynamically stable here in the ER.  This was a shared visit with my supervising physician Dr. Johnney Killian who independently saw and evaluated the patient & provided guidance in evaluation/management/disposition ,in agreement with care  Final Clinical Impression(s) / ED Diagnoses Final diagnoses:  Community acquired pneumonia of left lower lobe of lung  AKI (acute kidney injury) [facility])     Rx / [facility] Orders ED Discharge Orders    None       Lyndel Safe 07/08/20 2157    Charlesetta Shanks, MD 07/09/20 2152

## 2020-07-08 NOTE — ED Triage Notes (Signed)
reports cough for a week and shortness of breath for past two nights. Pt states breathing gets worse when he lays down.

## 2020-07-08 NOTE — ED Notes (Signed)
Patient transported to X-ray 

## 2020-07-09 DIAGNOSIS — J189 Pneumonia, unspecified organism: Secondary | ICD-10-CM

## 2020-07-09 DIAGNOSIS — Z880 Allergy status to penicillin: Secondary | ICD-10-CM | POA: Diagnosis not present

## 2020-07-09 DIAGNOSIS — I130 Hypertensive heart and chronic kidney disease with heart failure and stage 1 through stage 4 chronic kidney disease, or unspecified chronic kidney disease: Secondary | ICD-10-CM | POA: Diagnosis not present

## 2020-07-09 DIAGNOSIS — R911 Solitary pulmonary nodule: Secondary | ICD-10-CM | POA: Diagnosis not present

## 2020-07-09 DIAGNOSIS — Z20822 Contact with and (suspected) exposure to covid-19: Secondary | ICD-10-CM | POA: Diagnosis not present

## 2020-07-09 DIAGNOSIS — N184 Chronic kidney disease, stage 4 (severe): Secondary | ICD-10-CM | POA: Diagnosis not present

## 2020-07-09 DIAGNOSIS — Z96641 Presence of right artificial hip joint: Secondary | ICD-10-CM | POA: Diagnosis not present

## 2020-07-09 DIAGNOSIS — M109 Gout, unspecified: Secondary | ICD-10-CM | POA: Diagnosis not present

## 2020-07-09 DIAGNOSIS — E7800 Pure hypercholesterolemia, unspecified: Secondary | ICD-10-CM | POA: Diagnosis not present

## 2020-07-09 DIAGNOSIS — I252 Old myocardial infarction: Secondary | ICD-10-CM | POA: Diagnosis not present

## 2020-07-09 DIAGNOSIS — I2510 Atherosclerotic heart disease of native coronary artery without angina pectoris: Secondary | ICD-10-CM | POA: Diagnosis not present

## 2020-07-09 DIAGNOSIS — E1122 Type 2 diabetes mellitus with diabetic chronic kidney disease: Secondary | ICD-10-CM | POA: Diagnosis not present

## 2020-07-09 DIAGNOSIS — J181 Lobar pneumonia, unspecified organism: Secondary | ICD-10-CM | POA: Diagnosis not present

## 2020-07-09 DIAGNOSIS — I5023 Acute on chronic systolic (congestive) heart failure: Secondary | ICD-10-CM | POA: Diagnosis not present

## 2020-07-09 DIAGNOSIS — Z9861 Coronary angioplasty status: Secondary | ICD-10-CM | POA: Diagnosis not present

## 2020-07-09 DIAGNOSIS — Z888 Allergy status to other drugs, medicaments and biological substances status: Secondary | ICD-10-CM | POA: Diagnosis not present

## 2020-07-09 DIAGNOSIS — Z794 Long term (current) use of insulin: Secondary | ICD-10-CM | POA: Diagnosis not present

## 2020-07-09 DIAGNOSIS — Z7982 Long term (current) use of aspirin: Secondary | ICD-10-CM | POA: Diagnosis not present

## 2020-07-09 DIAGNOSIS — Z885 Allergy status to narcotic agent status: Secondary | ICD-10-CM | POA: Diagnosis not present

## 2020-07-09 DIAGNOSIS — Z79899 Other long term (current) drug therapy: Secondary | ICD-10-CM | POA: Diagnosis not present

## 2020-07-09 DIAGNOSIS — N400 Enlarged prostate without lower urinary tract symptoms: Secondary | ICD-10-CM | POA: Diagnosis not present

## 2020-07-09 DIAGNOSIS — Z87891 Personal history of nicotine dependence: Secondary | ICD-10-CM | POA: Diagnosis not present

## 2020-07-09 LAB — COMPREHENSIVE METABOLIC PANEL
ALT: 15 U/L (ref 0–44)
AST: 27 U/L (ref 15–41)
Albumin: 3.4 g/dL — ABNORMAL LOW (ref 3.5–5.0)
Alkaline Phosphatase: 82 U/L (ref 38–126)
Anion gap: 10 (ref 5–15)
BUN: 46 mg/dL — ABNORMAL HIGH (ref 8–23)
CO2: 17 mmol/L — ABNORMAL LOW (ref 22–32)
Calcium: 8.5 mg/dL — ABNORMAL LOW (ref 8.9–10.3)
Chloride: 107 mmol/L (ref 98–111)
Creatinine, Ser: 3.56 mg/dL — ABNORMAL HIGH (ref 0.61–1.24)
GFR, Estimated: 16 mL/min — ABNORMAL LOW (ref >60)
Glucose, Bld: 163 mg/dL — ABNORMAL HIGH (ref 70–99)
Potassium: 4.2 mmol/L (ref 3.5–5.1)
Sodium: 134 mmol/L — ABNORMAL LOW (ref 135–145)
Total Bilirubin: 0.7 mg/dL (ref 0.3–1.2)
Total Protein: 6.2 g/dL — ABNORMAL LOW (ref 6.5–8.1)

## 2020-07-09 LAB — GLUCOSE, CAPILLARY
Glucose-Capillary: 128 mg/dL — ABNORMAL HIGH (ref 70–99)
Glucose-Capillary: 119 mg/dL — ABNORMAL HIGH (ref 70–99)

## 2020-07-09 LAB — CBC
HCT: 30.4 % — ABNORMAL LOW (ref 39.0–52.0)
Hemoglobin: 10.8 g/dL — ABNORMAL LOW (ref 13.0–17.0)
MCH: 32.7 pg (ref 26.0–34.0)
MCHC: 35.5 g/dL (ref 30.0–36.0)
MCV: 92.1 fL (ref 80.0–100.0)
Platelets: 152 10*3/uL (ref 150–400)
RBC: 3.3 MIL/uL — ABNORMAL LOW (ref 4.22–5.81)
RDW: 13.2 % (ref 11.5–15.5)
WBC: 6.2 10*3/uL (ref 4.0–10.5)
nRBC: 0 % (ref 0.0–0.2)

## 2020-07-09 LAB — SARS CORONAVIRUS 2 (TAT 6-24 HRS): SARS Coronavirus 2: NEGATIVE

## 2020-07-09 LAB — CBG MONITORING, ED: Glucose-Capillary: 158 mg/dL — ABNORMAL HIGH (ref 70–99)

## 2020-07-09 MED ORDER — INSULIN ASPART 100 UNIT/ML ~~LOC~~ SOLN
0.0000 [IU] | Freq: Three times a day (TID) | SUBCUTANEOUS | Status: DC
  Administered 2020-07-09: 1 [IU] via SUBCUTANEOUS

## 2020-07-09 MED ORDER — ONDANSETRON HCL 4 MG/2ML IJ SOLN
4.0000 mg | Freq: Once | INTRAMUSCULAR | Status: AC
  Administered 2020-07-09: 4 mg via INTRAVENOUS
  Filled 2020-07-09: qty 2

## 2020-07-09 MED ORDER — HEPARIN SODIUM (PORCINE) 5000 UNIT/ML IJ SOLN
5000.0000 [IU] | Freq: Three times a day (TID) | INTRAMUSCULAR | Status: DC
  Administered 2020-07-09 – 2020-07-11 (×5): 5000 [IU] via SUBCUTANEOUS
  Filled 2020-07-09 (×5): qty 1

## 2020-07-09 MED ORDER — ONDANSETRON HCL 4 MG/2ML IJ SOLN
4.0000 mg | Freq: Four times a day (QID) | INTRAMUSCULAR | Status: DC | PRN
  Administered 2020-07-09: 4 mg via INTRAVENOUS
  Filled 2020-07-09: qty 2

## 2020-07-09 MED ORDER — HYDRALAZINE HCL 20 MG/ML IJ SOLN
5.0000 mg | Freq: Three times a day (TID) | INTRAMUSCULAR | Status: DC | PRN
  Administered 2020-07-09: 23:00:00 5 mg via INTRAVENOUS
  Filled 2020-07-09: qty 1

## 2020-07-09 MED ORDER — FUROSEMIDE 10 MG/ML IJ SOLN
40.0000 mg | Freq: Every day | INTRAMUSCULAR | Status: DC
  Administered 2020-07-09 – 2020-07-10 (×2): 40 mg via INTRAVENOUS
  Filled 2020-07-09 (×2): qty 4

## 2020-07-09 MED ORDER — ALBUTEROL SULFATE (2.5 MG/3ML) 0.083% IN NEBU: 2.5000 mg | INHALATION_SOLUTION | RESPIRATORY_TRACT | Status: DC | PRN

## 2020-07-09 MED ORDER — SODIUM CHLORIDE 0.9 % IV SOLN
2.0000 g | INTRAVENOUS | Status: DC
  Administered 2020-07-09: 18:00:00 2 g via INTRAVENOUS
  Filled 2020-07-09: qty 2
  Filled 2020-07-09: qty 20

## 2020-07-09 MED ORDER — ACETAMINOPHEN 650 MG RE SUPP: 650.0000 mg | Freq: Four times a day (QID) | RECTAL | Status: DC | PRN

## 2020-07-09 MED ORDER — ACETAMINOPHEN 325 MG PO TABS: 650.0000 mg | ORAL_TABLET | Freq: Four times a day (QID) | ORAL | Status: DC | PRN

## 2020-07-09 MED ORDER — INSULIN ASPART 100 UNIT/ML ~~LOC~~ SOLN: 0.0000 [IU] | Freq: Every day | SUBCUTANEOUS | Status: DC

## 2020-07-09 MED ORDER — ONDANSETRON HCL 4 MG PO TABS: 4.0000 mg | ORAL_TABLET | Freq: Four times a day (QID) | ORAL | Status: DC | PRN

## 2020-07-09 MED ORDER — AZITHROMYCIN 500 MG IV SOLR
500.0000 mg | INTRAVENOUS | Status: DC
  Administered 2020-07-09: 500 mg via INTRAVENOUS
  Filled 2020-07-09 (×2): qty 500

## 2020-07-09 NOTE — H&P (Signed)
History and Physical    GEN CLAGG RJJ:[id number] DOB: [birth year] DOA: 07/08/2020  PCP: [name], NP  Patient coming from: [facility]  Chief Complaint: shortness of breath.  HPI: Robert Lucas is a 80 y.o. male with medical history significant of DM2, CKD4, CAD. Presenting with dyspnea and cough for last 5 days. He reports that it started as shortness of breath and a non-productive cough. He tried some OTC meds, but it didn't help. He saw his PCP 4 days ago and was given an antibiotic to take. He noticed over the next several days that his symptoms were not improving and that he was having to sleep in a more upright position in order to breath. He called his PCP again and was told to come to the ED.   ED Course: CT chest showed consolidation in LLL. He also had an elevated BNP. TRH was called for admission.   Review of Systems:  Denies CP, N, V, F, palpitation, leg swelling. Reports orthopnea. Review of systems is otherwise negative for all not mentioned in HPI.   PMHx Past Medical History:  Diagnosis Date  . Arthritis   . Bell's palsy    one episode  . CKD (chronic kidney disease)    sees [place] in [place]  . Diabetes ([place])    type 2   sees endo at [place] in [place]  . Hypercholesteremia   . Hypertension   . Myocardial infarction ([place]) 1990  . Neuromuscular disorder ([place])     PSHx Past Surgical History:  Procedure Laterality Date  . BACK SURGERY  yrs ago   lower  . CARDIAC CATHETERIZATION    . CHOLECYSTECTOMY N/A 05/02/2017   Procedure: LAPAROSCOPIC CHOLECYSTECTOMY;  Surgeon: Ralene Ok, MD;  Location: [facility];  Service: General;  Laterality: N/A;  . CORONARY ANGIOPLASTY     2 1990  . KNEE SURGERY Left yrs ago   arthroscopic  . ROTATOR CUFF REPAIR Left yrs ago  . SHOULDER OPEN ROTATOR CUFF REPAIR Right 04/23/2013   Procedure: RIGHT SHOULDER ROTATOR CUFF REPAIR WITH GRAFT AND ANCHORS ;  Surgeon: Tobi Bastos, MD;  Location: [facility];  Service: Orthopedics;   Laterality: Right;  . TONSILLECTOMY    . TOTAL HIP ARTHROPLASTY Right 05/06/2019   Procedure: TOTAL HIP ARTHROPLASTY ANTERIOR APPROACH;  Surgeon: Paralee Cancel, MD;  Location: [facility];  Service: Orthopedics;  Laterality: Right;  70 mins    SocHx  reports that he quit smoking about 32 years ago. His smoking use included cigars. He quit after 10.00 years of use. He has never used smokeless tobacco. He reports previous alcohol use. He reports that he does not use drugs.  Allergies  Allergen Reactions  . Lisinopril Cough  . Hydrocodone     Other reaction(s): Nausea  . Liraglutide     Other reaction(s): GI upset  . Prednisone     Elevates BGL; patient prefers to not take this  . Sitagliptin     Other reaction(s): Unknown  . Penicillins Rash and Other (See Comments)    Rash around ankles Has patient had a PCN reaction causing immediate rash, facial/tongue/throat swelling, SOB or lightheadedness with hypotension: Yes Has patient had a PCN reaction causing severe rash involving mucus membranes or skin necrosis: Unk Has patient had a PCN reaction that required hospitalization: No Has patient had a PCN reaction occurring within the last 10 years: No If all of the above answers are "NO", then may proceed with Cephalosporin use.  FamHx Family History  Problem Relation Age of Onset  . Dementia Mother   . Stroke Brother     Prior to Admission medications   Medication Sig Start Date End Date Taking? Authorizing Provider  acetaminophen (TYLENOL) 500 MG tablet Take 1,000 mg by mouth every 6 (six) hours as needed for moderate pain or headache.     [provider]  allopurinol (ZYLOPRIM) 100 MG tablet Take 100 mg by mouth every evening.     [provider]  aspirin EC 81 MG tablet Take 81 mg by mouth daily.    [provider]  atorvastatin (LIPITOR) 40 MG tablet Take 40 mg by mouth daily.    [provider]  calcitRIOL (ROCALTROL) 0.25 MCG capsule Take  0.25 mcg by mouth 2 (two) times a week.    [provider]  diltiazem (TIAZAC) 420 MG 24 hr capsule Take 420 mg by mouth daily.    [provider]  ferrous sulfate (FERROUSUL) 325 (65 FE) MG tablet Take 1 tablet (325 mg total) by mouth 3 (three) times daily with meals for 14 days. Patient taking differently: Take 325 mg by mouth 2 (two) times a week. 05/07/19 07/30/19  Danae Orleans, PA-C  finasteride (PROSCAR) 5 MG tablet Take 5 mg by mouth daily.    [provider]  fluticasone (FLONASE) 50 MCG/ACT nasal spray Place 1 spray into both nostrils daily as needed for allergies or rhinitis.    [provider]  furosemide (LASIX) 40 MG tablet Take 40 mg by mouth daily.    [provider]  insulin aspart protamine - aspart (NOVOLOG MIX 70/30 FLEXPEN) (70-30) 100 UNIT/ML FlexPen 14 units    [provider]  insulin aspart protamine- aspart (NOVOLOG MIX 70/30) (70-30) 100 UNIT/ML injection Inject 20 Units into the skin 2 (two) times daily as needed. PRN 180-200 (14 units) >200 (20 units)    [provider]  levofloxacin (LEVAQUIN) 750 MG tablet Take 750 mg by mouth daily. 10 day supply 07/06/20   [provider]  losartan (COZAAR) 100 MG tablet Take 100 mg by mouth every evening.     [provider]  terazosin (HYTRIN) 10 MG capsule Take 10 mg by mouth at bedtime.     [provider]  traMADol (ULTRAM) 50 MG tablet Take 50 mg by mouth at bedtime as needed. 03/13/19   [provider]    Physical Exam: Vitals:   07/09/20 0946 07/09/20 1106 07/09/20 1133 07/09/20 1228  BP: (!) 147/63 (!) 161/70 (!) 161/70 (!) 177/88  Pulse: 89 79 74 79  Resp: 20 18 15  (!) 22  Temp:    98 F (36.7 C)  TempSrc:    Oral  SpO2: 96% 98% 95% 100%  Weight:      Height:        General: 80 y.o. male resting in bed in NAD Eyes: PERRL, normal sclera ENMT: Nares patent w/o discharge, orophaynx clear, dentition normal, ears w/o  discharge/lesions/ulcers Neck: Supple, trachea midline Cardiovascular: RRR, +S1, S2, no m/g/r, equal pulses throughout Respiratory: soft crackles at bases, normal WOB on RA GI: BS+, NDNT, no masses noted, no organomegaly noted MSK: No e/c/c Skin: No rashes, bruises, ulcerations noted Neuro: A&O x 3, no focal deficits Psyc: Appropriate interaction and affect, calm/cooperative  Labs on Admission: I have personally reviewed following labs and imaging studies  CBC: Recent Labs  Lab 07/08/20 1859 07/09/20 0527  WBC 6.6 6.2  HGB 10.4* 10.8*  HCT  29.8* 30.4*  MCV 92.8 92.1  PLT 177 878   Basic Metabolic Panel: Recent Labs  Lab 07/08/20 1859 07/09/20 0527  NA 133* 134*  K 4.3 4.2  CL 107 107  CO2 16* 17*  GLUCOSE 207* 163*  BUN 45* 46*  CREATININE 3.52* 3.56*  CALCIUM 8.4* 8.5*   GFR: Estimated Creatinine Clearance: 15.2 mL/min (A) (by C-G formula based on SCr of 3.56 mg/dL (H)). Liver Function Tests: Recent Labs  Lab 07/09/20 0527  AST 27  ALT 15  ALKPHOS 82  BILITOT 0.7  PROT 6.2*  ALBUMIN 3.4*   No results for input(s): LIPASE, AMYLASE in the last 168 hours. No results for input(s): AMMONIA in the last 168 hours. Coagulation Profile: No results for input(s): INR, PROTIME in the last 168 hours. Cardiac Enzymes: No results for input(s): CKTOTAL, CKMB, CKMBINDEX, TROPONINI in the last 168 hours. BNP (last 3 results) No results for input(s): PROBNP in the last 8760 hours. HbA1C: No results for input(s): HGBA1C in the last 72 hours. CBG: Recent Labs  Lab 07/09/20 0956  GLUCAP 158*   Lipid Profile: No results for input(s): CHOL, HDL, LDLCALC, TRIG, CHOLHDL, LDLDIRECT in the last 72 hours. Thyroid Function Tests: No results for input(s): TSH, T4TOTAL, FREET4, T3FREE, THYROIDAB in the last 72 hours. Anemia Panel: No results for input(s): VITAMINB12, FOLATE, FERRITIN, TIBC, IRON, RETICCTPCT in the last 72 hours. Urine analysis:    Component Value Date/Time    COLORURINE STRAW (A) 11/15/2017 2026   APPEARANCEUR CLEAR 11/15/2017 2026   LABSPEC 1.004 (L) 11/15/2017 2026   PHURINE 6.0 11/15/2017 2026   GLUCOSEU 50 (A) 11/15/2017 2026   HGBUR SMALL (A) 11/15/2017 2026   BILIRUBINUR NEGATIVE 11/15/2017 2026   KETONESUR NEGATIVE 11/15/2017 2026   PROTEINUR 100 (A) 11/15/2017 2026   UROBILINOGEN 0.2 04/17/2013 0828   NITRITE NEGATIVE 11/15/2017 2026   LEUKOCYTESUR NEGATIVE 11/15/2017 2026    Radiological Exams on Admission: DG Chest 2 View  Result Date: 07/08/2020 CLINICAL DATA:  Short of breath for 3 days, hypertension, diabetes EXAM: CHEST - 2 VIEW COMPARISON:  11/15/2017 FINDINGS: Frontal and lateral views of the chest demonstrate a stable cardiac silhouette. There is left lower lobe airspace disease with small left pleural effusion, consistent with underlying pneumonia. Right chest is clear. No pneumothorax. No acute bony abnormalities. IMPRESSION: 1. Left lower lobe consolidation compatible with pneumonia. 2. Small left parapneumonic effusion. Electronically Signed   By: Randa Ngo M.D.   On: 07/08/2020 19:03   CT Chest Wo Contrast  Result Date: 07/08/2020 CLINICAL DATA:  Short of breath for 4 days, left lower lobe pneumonia EXAM: CT CHEST WITHOUT CONTRAST TECHNIQUE: Multidetector CT imaging of the chest was performed following the standard protocol without IV contrast. COMPARISON:  07/08/2020 FINDINGS: Cardiovascular: Unenhanced images of the heart demonstrate trace pericardial effusion. The heart is not enlarged. There is extensive atherosclerosis of the coronary vasculature. The thoracic aorta is normal in caliber, with diffuse atherosclerosis. Mediastinum/Nodes: No enlarged mediastinal or axillary lymph nodes. Thyroid gland, trachea, and esophagus demonstrate no significant findings. Lungs/Pleura: There are bilateral pleural effusions, less than 1 L each. There are areas of dependent atelectasis within the bilateral lower lobes, with likely  superimposed airspace disease seen within the non dependent left lower lobe. Minimal patchy ground-glass airspace disease is seen within the right apex. No evidence of pneumothorax. 5 mm right middle lobe pulmonary nodule image 76/5. No other pulmonary nodules or masses. The central airways are patent. Upper Abdomen: No acute  abnormality. Musculoskeletal: No acute or destructive bony lesions. Reconstructed images demonstrate progressive spondylosis at the T8/T9 level, with complete loss of disc space, eburnation, and circumferential osteophyte formation. 4 mm of anterolisthesis of T8 on T9 has developed in the interim. IMPRESSION: 1. Bilateral pleural effusions, with mild compressive atelectasis of the dependent lower lobes. 2. Consolidation within the non dependent left lower lobe compatible with airspace disease and pneumonia. Patchy ground-glass right upper lobe airspace disease also likely infectious. 3. 5 mm right middle lobe pulmonary nodule. No follow-up needed if patient is low-risk. Non-contrast chest CT can be considered in 12 months if patient is high-risk. This recommendation follows the consensus statement: Guidelines for Management of Incidental Pulmonary Nodules Detected on CT Images: From the Fleischner Society 2017; Radiology 2017; 284:228-243. 4. Progressive spondylosis at T8/T9. 5. Aortic Atherosclerosis (ICD10-I70.0). Coronary artery atherosclerosis. Electronically Signed   By: Randa Ngo M.D.   On: 07/08/2020 20:42    EKG: Independently reviewed. NSR, no st elevation  Assessment/Plan LLL PNA     - admit to obs, tele     - rocephin, zithro     - check urine legionella/strep     - O2 as necessary  CHF?     - he doesn't have a current dx     - continue lasix 40mg  IV     - check echo     - EKG is ok, no chest pain, follow     - follow daily wts, I&O  CKD4     - [facility]     - watch nephrotoxins     - need to diurese; will continue IV lasix 40mg  daily for now     -  we don't have baseline Scr data for this year; last known is 2.4 in 2020; will request PCP records     - will ask nephro to weigh in on diuresis  HTN     - resume home meds when med rec complete  DM2     - SSI, DM diet, A1c, glucose checks  CAD     - resume home meds  DVT prophylaxis: Heparin  Code Status: FULL  Family Communication: None at bedside.  Consults called: Nephrology   Status is: Observation  The patient remains OBS appropriate and will d/c before 2 midnights.  Dispo: The patient is from: Home              Anticipated d/c is to: Home              Anticipated d/c date is: 1 day              Patient currently is not medically stable to d/c.  Jonnie Finner DO [facility]  If 7PM-7AM, please contact night-coverage [email or web address]  07/09/2020, 2:49 PM

## 2020-07-09 NOTE — Progress Notes (Signed)
PT does not have respiratory hx nor does he take any related meds at home. PT is 97% on RA, BBS diminished clear. PT states he is breathing fine at this time. PT is working with [facility] and was encouraged to initiate some coughing- NPC at this time. RT ordered Albuterol Q4 PRN in case of wheezing and/ or SOB.

## 2020-07-10 ENCOUNTER — Observation Stay (HOSPITAL_COMMUNITY): Payer: Medicare Other

## 2020-07-10 DIAGNOSIS — Z79899 Other long term (current) drug therapy: Secondary | ICD-10-CM | POA: Diagnosis not present

## 2020-07-10 DIAGNOSIS — Z794 Long term (current) use of insulin: Secondary | ICD-10-CM

## 2020-07-10 DIAGNOSIS — Z20822 Contact with and (suspected) exposure to covid-19: Secondary | ICD-10-CM | POA: Diagnosis present

## 2020-07-10 DIAGNOSIS — Z96641 Presence of right artificial hip joint: Secondary | ICD-10-CM | POA: Diagnosis present

## 2020-07-10 DIAGNOSIS — E7800 Pure hypercholesterolemia, unspecified: Secondary | ICD-10-CM | POA: Diagnosis present

## 2020-07-10 DIAGNOSIS — R911 Solitary pulmonary nodule: Secondary | ICD-10-CM | POA: Diagnosis present

## 2020-07-10 DIAGNOSIS — E119 Type 2 diabetes mellitus without complications: Secondary | ICD-10-CM | POA: Diagnosis not present

## 2020-07-10 DIAGNOSIS — J189 Pneumonia, unspecified organism: Secondary | ICD-10-CM | POA: Diagnosis present

## 2020-07-10 DIAGNOSIS — Z888 Allergy status to other drugs, medicaments and biological substances status: Secondary | ICD-10-CM | POA: Diagnosis not present

## 2020-07-10 DIAGNOSIS — Z87891 Personal history of nicotine dependence: Secondary | ICD-10-CM | POA: Diagnosis not present

## 2020-07-10 DIAGNOSIS — E1122 Type 2 diabetes mellitus with diabetic chronic kidney disease: Secondary | ICD-10-CM | POA: Diagnosis not present

## 2020-07-10 DIAGNOSIS — M109 Gout, unspecified: Secondary | ICD-10-CM | POA: Diagnosis present

## 2020-07-10 DIAGNOSIS — J181 Lobar pneumonia, unspecified organism: Secondary | ICD-10-CM | POA: Diagnosis present

## 2020-07-10 DIAGNOSIS — I252 Old myocardial infarction: Secondary | ICD-10-CM | POA: Diagnosis not present

## 2020-07-10 DIAGNOSIS — I5023 Acute on chronic systolic (congestive) heart failure: Secondary | ICD-10-CM

## 2020-07-10 DIAGNOSIS — N400 Enlarged prostate without lower urinary tract symptoms: Secondary | ICD-10-CM | POA: Diagnosis present

## 2020-07-10 DIAGNOSIS — I2510 Atherosclerotic heart disease of native coronary artery without angina pectoris: Secondary | ICD-10-CM | POA: Diagnosis present

## 2020-07-10 DIAGNOSIS — I5021 Acute systolic (congestive) heart failure: Secondary | ICD-10-CM

## 2020-07-10 DIAGNOSIS — Z9861 Coronary angioplasty status: Secondary | ICD-10-CM | POA: Diagnosis not present

## 2020-07-10 DIAGNOSIS — Z885 Allergy status to narcotic agent status: Secondary | ICD-10-CM | POA: Diagnosis not present

## 2020-07-10 DIAGNOSIS — Z7982 Long term (current) use of aspirin: Secondary | ICD-10-CM | POA: Diagnosis not present

## 2020-07-10 DIAGNOSIS — Z880 Allergy status to penicillin: Secondary | ICD-10-CM | POA: Diagnosis not present

## 2020-07-10 DIAGNOSIS — N184 Chronic kidney disease, stage 4 (severe): Secondary | ICD-10-CM

## 2020-07-10 DIAGNOSIS — I130 Hypertensive heart and chronic kidney disease with heart failure and stage 1 through stage 4 chronic kidney disease, or unspecified chronic kidney disease: Secondary | ICD-10-CM | POA: Diagnosis present

## 2020-07-10 LAB — URINALYSIS, ROUTINE W REFLEX MICROSCOPIC
Bacteria, UA: NONE SEEN
Bilirubin Urine: NEGATIVE
Glucose, UA: 50 mg/dL — AB
Ketones, ur: NEGATIVE mg/dL
Leukocytes,Ua: NEGATIVE
Nitrite: NEGATIVE
Protein, ur: >=300 mg/dL — AB
Specific Gravity, Urine: 1.01 (ref 1.005–1.030)
pH: 6 (ref 5.0–8.0)

## 2020-07-10 LAB — COMPREHENSIVE METABOLIC PANEL
ALT: 18 U/L (ref 0–44)
AST: 30 U/L (ref 15–41)
Albumin: 3.4 g/dL — ABNORMAL LOW (ref 3.5–5.0)
Alkaline Phosphatase: 91 U/L (ref 38–126)
Anion gap: 10 (ref 5–15)
BUN: 43 mg/dL — ABNORMAL HIGH (ref 8–23)
CO2: 22 mmol/L (ref 22–32)
Calcium: 8.8 mg/dL — ABNORMAL LOW (ref 8.9–10.3)
Chloride: 107 mmol/L (ref 98–111)
Creatinine, Ser: 3.74 mg/dL — ABNORMAL HIGH (ref 0.61–1.24)
GFR, Estimated: 16 mL/min — ABNORMAL LOW (ref >60)
Glucose, Bld: 124 mg/dL — ABNORMAL HIGH (ref 70–99)
Potassium: 4.6 mmol/L (ref 3.5–5.1)
Sodium: 139 mmol/L (ref 135–145)
Total Bilirubin: 0.5 mg/dL (ref 0.3–1.2)
Total Protein: 6.5 g/dL (ref 6.5–8.1)

## 2020-07-10 LAB — CBC
HCT: 33.2 % — ABNORMAL LOW (ref 39.0–52.0)
Hemoglobin: 11.4 g/dL — ABNORMAL LOW (ref 13.0–17.0)
MCH: 32.2 pg (ref 26.0–34.0)
MCHC: 34.3 g/dL (ref 30.0–36.0)
MCV: 93.8 fL (ref 80.0–100.0)
Platelets: 175 10*3/uL (ref 150–400)
RBC: 3.54 MIL/uL — ABNORMAL LOW (ref 4.22–5.81)
RDW: 13.3 % (ref 11.5–15.5)
WBC: 6.8 10*3/uL (ref 4.0–10.5)
nRBC: 0 % (ref 0.0–0.2)

## 2020-07-10 LAB — ECHOCARDIOGRAM COMPLETE
AR max vel: 1.94 cm2
AV Area VTI: 1.98 cm2
AV Area mean vel: 1.95 cm2
AV Mean grad: 8 mmHg
AV Peak grad: 14.1 mmHg
Ao pk vel: 1.88 m/s
Area-P 1/2: 3.21 cm2
Height: 63 in
S' Lateral: 4.5 cm
Weight: 2800 oz

## 2020-07-10 LAB — GLUCOSE, CAPILLARY
Glucose-Capillary: 112 mg/dL — ABNORMAL HIGH (ref 70–99)
Glucose-Capillary: 147 mg/dL — ABNORMAL HIGH (ref 70–99)
Glucose-Capillary: 112 mg/dL — ABNORMAL HIGH (ref 70–99)
Glucose-Capillary: 141 mg/dL — ABNORMAL HIGH (ref 70–99)

## 2020-07-10 LAB — TROPONIN I (HIGH SENSITIVITY)
Troponin I (High Sensitivity): 61 ng/L — ABNORMAL HIGH (ref <18)
Troponin I (High Sensitivity): 66 ng/L — ABNORMAL HIGH (ref <18)

## 2020-07-10 LAB — HEMOGLOBIN A1C
Hgb A1c MFr Bld: 6.3 % — ABNORMAL HIGH (ref 4.8–5.6)
Mean Plasma Glucose: 134.11 mg/dL

## 2020-07-10 LAB — STREP PNEUMONIAE URINARY ANTIGEN: Strep Pneumo Urinary Antigen: NEGATIVE

## 2020-07-10 MED ORDER — FINASTERIDE 5 MG PO TABS
5.0000 mg | ORAL_TABLET | Freq: Every day | ORAL | Status: DC
Start: 2020-07-10 — End: 2020-07-11
  Administered 2020-07-10 – 2020-07-11 (×2): 5 mg via ORAL
  Filled 2020-07-10 (×2): qty 1

## 2020-07-10 MED ORDER — METOPROLOL SUCCINATE ER 50 MG PO TB24
50.0000 mg | ORAL_TABLET | Freq: Every day | ORAL | Status: DC
  Administered 2020-07-10 – 2020-07-11 (×2): 50 mg via ORAL
  Filled 2020-07-10 (×2): qty 1

## 2020-07-10 MED ORDER — ISOSORBIDE MONONITRATE ER 30 MG PO TB24: 30.0000 mg | ORAL_TABLET | Freq: Every day | ORAL | Status: DC

## 2020-07-10 MED ORDER — ISOSORB DINITRATE-HYDRALAZINE 20-37.5 MG PO TABS
1.0000 | ORAL_TABLET | Freq: Three times a day (TID) | ORAL | Status: DC
  Filled 2020-07-10: qty 1

## 2020-07-10 MED ORDER — FUROSEMIDE 40 MG PO TABS
40.0000 mg | ORAL_TABLET | Freq: Every day | ORAL | Status: DC
Start: 2020-07-11 — End: 2020-07-11
  Administered 2020-07-11: 40 mg via ORAL
  Filled 2020-07-10: qty 1

## 2020-07-10 MED ORDER — CEFDINIR 300 MG PO CAPS
300.0000 mg | ORAL_CAPSULE | Freq: Every day | ORAL | Status: DC
  Administered 2020-07-10 – 2020-07-11 (×2): 300 mg via ORAL
  Filled 2020-07-10 (×2): qty 1

## 2020-07-10 MED ORDER — HYDRALAZINE HCL 25 MG PO TABS
25.0000 mg | ORAL_TABLET | Freq: Three times a day (TID) | ORAL | Status: DC
  Administered 2020-07-10 – 2020-07-11 (×2): 25 mg via ORAL
  Filled 2020-07-10 (×2): qty 1

## 2020-07-10 MED ORDER — ALLOPURINOL 100 MG PO TABS
100.0000 mg | ORAL_TABLET | Freq: Every day | ORAL | Status: DC
  Administered 2020-07-10 – 2020-07-11 (×2): 100 mg via ORAL
  Filled 2020-07-10 (×2): qty 1

## 2020-07-10 MED ORDER — TAMSULOSIN HCL 0.4 MG PO CAPS
0.4000 mg | ORAL_CAPSULE | Freq: Every day | ORAL | Status: DC
  Administered 2020-07-10 – 2020-07-11 (×2): 0.4 mg via ORAL
  Filled 2020-07-10 (×2): qty 1

## 2020-07-10 MED ORDER — ATORVASTATIN CALCIUM 40 MG PO TABS
40.0000 mg | ORAL_TABLET | Freq: Every evening | ORAL | Status: DC
  Administered 2020-07-10: 40 mg via ORAL
  Filled 2020-07-10: qty 1

## 2020-07-10 MED ORDER — ASPIRIN EC 81 MG PO TBEC
81.0000 mg | DELAYED_RELEASE_TABLET | Freq: Every day | ORAL | Status: DC
  Administered 2020-07-10 – 2020-07-11 (×2): 81 mg via ORAL
  Filled 2020-07-10 (×2): qty 1

## 2020-07-10 MED ORDER — AZITHROMYCIN 250 MG PO TABS
500.0000 mg | ORAL_TABLET | Freq: Every day | ORAL | Status: DC
  Administered 2020-07-10 – 2020-07-11 (×2): 500 mg via ORAL
  Filled 2020-07-10 (×2): qty 2

## 2020-07-10 MED ORDER — ISOSORBIDE MONONITRATE ER 30 MG PO TB24
15.0000 mg | ORAL_TABLET | Freq: Every day | ORAL | Status: DC
  Administered 2020-07-10 – 2020-07-11 (×2): 15 mg via ORAL
  Filled 2020-07-10 (×2): qty 1

## 2020-07-10 NOTE — Progress Notes (Signed)
PHARMACY NOTE:  ANTIMICROBIAL RENAL DOSAGE ADJUSTMENT  Current antimicrobial regimen includes a mismatch between antimicrobial dosage and estimated renal function.  As per policy approved by the Pharmacy & Therapeutics and Medical Executive Committees, the antimicrobial dosage will be adjusted accordingly.  Current antimicrobial dosage:  Cefdinir 300mg  BID  Indication: CAP  Renal Function:  Estimated Creatinine Clearance: 14.4 mL/min (A) (by C-G formula based on SCr of 3.74 mg/dL (H)). []      On intermittent HD, scheduled: []      On CRRT    Antimicrobial dosage has been changed to:  Cefdinir 300mg  daily  Additional comments:   Thank you for allowing pharmacy to be a part of this patient's care.  Peggyann Juba, PharmD, BCPS Pharmacy: [phone] 07/10/2020 3:53 PM

## 2020-07-10 NOTE — Progress Notes (Addendum)
PROGRESS NOTE    Robert Lucas  YOV:[id number] DOB: June 11, 1941 DOA: 07/08/2020 PCP: [name], NP    Chief Complaint  Patient presents with  . Shortness of Breath    Brief Narrative:  H/o pertension, hyperlipidemia, IDDM, CAD s/p anterior MI in 1990s treated wiuth TPA and PTCA, CKD 4,Presenting with dyspnea and cough for last 5 days. He reports that it started as shortness of breath and a non-productive cough. He tried some OTC meds, but it didn't help. He saw his PCP 4 days ago and was given an antibiotic to take. He noticed over the next several days that his symptoms were not improving and that he was having to sleep in a more upright position in order to breath. He called his PCP again and was told to come to the ED  Subjective:   Some dry cough, denies chest pain, wheezing resolved, able to lay flat last night, no edema, appetite improved Blood pressure elevated, Cr got worse  Assessment & Plan:   Active Problems:   CAP (community acquired pneumonia)  Lobar pneumonia, left lower lobe -(CT chest personally reviewed showed bilateral pleural effusion and possible left lower lobe infiltrate) -No fever, no leukocytosis -treated with levaquin prior to hospitalization, given rocephin and zithromax since admission, will change to [facility] and zithro oral to minimize volume   Acute systolic heart failure/bilateral pleural effusions -lvEF August 2020 was 55%, echo done this morning showed EF reduced to 25% with left ventricular hypertrophy and grade 1 diastolic dysfunction -Responded to IV Lasix, 3 L urine output last 24 hours -Today on exam he is euvolemic, denies chest pain , no short of breath, no edema, changed to oral Lasix - check troponin, repeat EKG for interval changes, initial EKG no acute ST-T changes -Case discussed with cardiology Dr. Einar Gip, as patient is asymptomatic, volume status is reasonable, patient can follow-up with cardiology next week on outpatient  basis, -Dr Einar Gip recommend discontinue patient post medication Cardizem, recommend start metoprolol succinate, bidil, reduce losartan - Dr. Einar Gip will arrange appointment next week  HTN:  Blood pressure adjustment as mentioned above DC Cardizem Reduce losartan Start metoprolol succinate Start bidil Continue home dose Lasix Monitor blood pressure  CAD -s/p anterior MI in 1990s treated wiuth TPA and PTCA, -Currently denies chest pain , no short of breath, reports no activity limitation at baseline -No dyspnea on exertion at baseline either -Continue aspirin, statin  Insulin-dependent type 2 diabetes, fairly controlled -A1c 6.3 -A.m. fasting blood glucose 124 -Continue sliding scale insulin  CKD 4 -Creatinine baseline -Nephrology input appreciated, patient used to follow with [facility] [facility], he prefers to switch to Dr. Joelyn Oms  5 mm right middle lobe pulmonary nodule. No follow-up needed if patient is low-risk. Non-contrast chest CT can be considered in 12 months if patient is high-risk. This recommendation follows the consensus statement: Guidelines for Management of Incidental Pulmonary Nodules Detected on CT Images: From the Fleischner Society 2017; Radiology 2017; 284:228-243.  BPH Continue Proscar, Flomax  History of gout Continue allopurinol   DVT prophylaxis: heparin injection 5,000 Units Start: 07/09/20 2200   Code Status: Full Family Communication: Patient Disposition:   Status is: Inpatient   Dispo: The patient is from: Independent living              Anticipated d/c is to: Independent living              Anticipated d/c date is: Possible tomorrow  Consultants:   Cardiology Dr. Einar Gip  Nephrology Dr. Joelyn Oms  Procedures:   None  Antimicrobials:   Anti-infectives (From admission, onward)   Start     Dose/Rate Route Frequency Ordered Stop   07/10/20 2200  cefdinir (OMNICEF) capsule 300 mg        300 mg Oral Daily at bedtime  07/10/20 1547     07/10/20 1800  azithromycin (ZITHROMAX) tablet 500 mg        500 mg Oral Daily 07/10/20 1547     07/09/20 1800  cefTRIAXone (ROCEPHIN) 2 g in sodium chloride 0.9 % 100 mL IVPB  Status:  Discontinued        2 g 200 mL/hr over 30 Minutes Intravenous Every 24 hours 07/09/20 1541 07/10/20 1547   07/09/20 1800  azithromycin (ZITHROMAX) 500 mg in sodium chloride 0.9 % 250 mL IVPB  Status:  Discontinued        500 mg 250 mL/hr over 60 Minutes Intravenous Every 24 hours 07/09/20 1541 07/10/20 1547   07/08/20 2015  cefTRIAXone (ROCEPHIN) 1 g in sodium chloride 0.9 % 100 mL IVPB        1 g 200 mL/hr over 30 Minutes Intravenous  Once 07/08/20 2007 07/09/20 0252   07/08/20 2015  doxycycline (VIBRA-TABS) tablet 100 mg        100 mg Oral  Once 07/08/20 2007 07/08/20 2038          Objective: Vitals:   07/09/20 2100 07/09/20 2325 07/10/20 0004 07/10/20 0452  BP: (!) 187/86 (!) 170/71 (!) 162/79 (!) 173/75  Pulse: 82 78 88 84  Resp: 18   17  Temp: 98 F (36.7 C)   98.3 F (36.8 C)  TempSrc: Oral   Oral  SpO2: 100% 100%  97%  Weight:      Height:        Intake/Output Summary (Last 24 hours) at 07/10/2020 1108 Last data filed at 07/10/2020 1000 Gross per 24 hour  Intake 549.86 ml  Output 2750 ml  Net -2200.14 ml   Filed Weights   07/08/20 1832  Weight: 79.4 kg    Examination:  General exam: calm, NAD Respiratory system: Slightly diminished at bases, no wheezing, no rales, no rhonchi. Respiratory effort normal. Cardiovascular system: S1 & S2 heard, RRR. No JVD, no murmur, No pedal edema. Gastrointestinal system: Abdomen is nondistended, soft and nontender.  Normal bowel sounds heard. Central nervous system: Alert and oriented. No focal neurological deficits. Extremities: Symmetric 5 x 5 power. Skin: No rashes, lesions or ulcers Psychiatry: Judgement and insight appear normal. Mood & affect appropriate.     Data Reviewed: I have personally reviewed following labs  and imaging studies  CBC: Recent Labs  Lab 07/08/20 1859 07/09/20 0527 07/10/20 0557  WBC 6.6 6.2 6.8  HGB 10.4* 10.8* 11.4*  HCT 29.8* 30.4* 33.2*  MCV 92.8 92.1 93.8  PLT 177 152 353    Basic Metabolic Panel: Recent Labs  Lab 07/08/20 1859 07/09/20 0527 07/10/20 0557  NA 133* 134* 139  K 4.3 4.2 4.6  CL 107 107 107  CO2 16* 17* 22  GLUCOSE 207* 163* 124*  BUN 45* 46* 43*  CREATININE 3.52* 3.56* 3.74*  CALCIUM 8.4* 8.5* 8.8*    GFR: Estimated Creatinine Clearance: 14.4 mL/min (A) (by C-G formula based on SCr of 3.74 mg/dL (H)).  Liver Function Tests: Recent Labs  Lab 07/09/20 0527 07/10/20 0557  AST 27 30  ALT 15 18  ALKPHOS 82 91  BILITOT  0.7 0.5  PROT 6.2* 6.5  ALBUMIN 3.4* 3.4*    CBG: Recent Labs  Lab 07/09/20 0956 07/09/20 1632 07/09/20 2142 07/10/20 0812  GLUCAP 158* 128* 119* 112*     Recent Results (from the past 240 hour(s))  SARS CORONAVIRUS 2 (TAT 6-24 HRS) Nasopharyngeal Nasopharyngeal Swab     Status: None   Collection Time: 07/08/20  7:24 PM   Specimen: Nasopharyngeal Swab  Result Value Ref Range Status   SARS Coronavirus 2 NEGATIVE NEGATIVE Final    Comment: (NOTE) SARS-CoV-2 target nucleic acids are NOT DETECTED.  The SARS-CoV-2 RNA is generally detectable in upper and lower respiratory specimens during the acute phase of infection. Negative results do not preclude SARS-CoV-2 infection, do not rule out co-infections with other pathogens, and should not be used as the sole basis for treatment or other patient management decisions. Negative results must be combined with clinical observations, patient history, and epidemiological information. The expected result is Negative.  Fact Sheet for Patients: SugarRoll.be  Fact Sheet for Healthcare Providers: https://www.woods-mathews.com/  This test is not yet approved or cleared by the [place] FDA and  has been authorized for  detection and/or diagnosis of SARS-CoV-2 by FDA under an Emergency Use Authorization (EUA). This EUA will remain  in effect (meaning this test can be used) for the duration of the COVID-19 declaration under Se ction 564(b)(1) of the Act, 21 U.S.C. section 360bbb-3(b)(1), unless the authorization is terminated or revoked sooner.  Performed at [facility], [place] [address], [place], [place] [address]   Resp Panel by RT-PCR (Flu A&B, Covid) Nasopharyngeal Swab     Status: None   Collection Time: 07/08/20  9:34 PM   Specimen: Nasopharyngeal Swab; Nasopharyngeal(NP) swabs in vial transport medium  Result Value Ref Range Status   SARS Coronavirus 2 by RT PCR NEGATIVE NEGATIVE Final    Comment: (NOTE) SARS-CoV-2 target nucleic acids are NOT DETECTED.  The SARS-CoV-2 RNA is generally detectable in upper respiratory specimens during the acute phase of infection. The lowest concentration of SARS-CoV-2 viral copies this assay can detect is 138 copies/mL. A negative result does not preclude SARS-Cov-2 infection and should not be used as the sole basis for treatment or other patient management decisions. A negative result may occur with  improper specimen collection/handling, submission of specimen other than nasopharyngeal swab, presence of viral mutation(s) within the areas targeted by this assay, and inadequate number of viral copies(<138 copies/mL). A negative result must be combined with clinical observations, patient history, and epidemiological information. The expected result is Negative.  Fact Sheet for Patients:  EntrepreneurPulse.com.au  Fact Sheet for Healthcare Providers:  IncredibleEmployment.be  This test is no t yet approved or cleared by the [place] FDA and  has been authorized for detection and/or diagnosis of SARS-CoV-2 by FDA under an Emergency Use Authorization (EUA). This EUA will remain  in effect (meaning this test can  be used) for the duration of the COVID-19 declaration under Section 564(b)(1) of the Act, 21 U.S.C.section 360bbb-3(b)(1), unless the authorization is terminated  or revoked sooner.       Influenza A by PCR NEGATIVE NEGATIVE Final   Influenza B by PCR NEGATIVE NEGATIVE Final    Comment: (NOTE) The Xpert Xpress SARS-CoV-2/FLU/RSV plus assay is intended as an aid in the diagnosis of influenza from Nasopharyngeal swab specimens and should not be used as a sole basis for treatment. Nasal washings and aspirates are unacceptable for Xpert Xpress SARS-CoV-2/FLU/RSV testing.  Fact Sheet  for Patients: EntrepreneurPulse.com.au  Fact Sheet for Healthcare Providers: IncredibleEmployment.be  This test is not yet approved or cleared by the [place] FDA and has been authorized for detection and/or diagnosis of SARS-CoV-2 by FDA under an Emergency Use Authorization (EUA). This EUA will remain in effect (meaning this test can be used) for the duration of the COVID-19 declaration under Section 564(b)(1) of the Act, 21 U.S.C. section 360bbb-3(b)(1), unless the authorization is terminated or revoked.  Performed at [facility], [address] [place], [place] [address]          Radiology Studies: DG Chest 2 View  Result Date: 07/08/2020 CLINICAL DATA:  Short of breath for 3 days, hypertension, diabetes EXAM: CHEST - 2 VIEW COMPARISON:  11/15/2017 FINDINGS: Frontal and lateral views of the chest demonstrate a stable cardiac silhouette. There is left lower lobe airspace disease with small left pleural effusion, consistent with underlying pneumonia. Right chest is clear. No pneumothorax. No acute bony abnormalities. IMPRESSION: 1. Left lower lobe consolidation compatible with pneumonia. 2. Small left parapneumonic effusion. Electronically Signed   By: Randa Ngo M.D.   On: 07/08/2020 19:03   CT Chest Wo Contrast  Result Date:  07/08/2020 CLINICAL DATA:  Short of breath for 4 days, left lower lobe pneumonia EXAM: CT CHEST WITHOUT CONTRAST TECHNIQUE: Multidetector CT imaging of the chest was performed following the standard protocol without IV contrast. COMPARISON:  07/08/2020 FINDINGS: Cardiovascular: Unenhanced images of the heart demonstrate trace pericardial effusion. The heart is not enlarged. There is extensive atherosclerosis of the coronary vasculature. The thoracic aorta is normal in caliber, with diffuse atherosclerosis. Mediastinum/Nodes: No enlarged mediastinal or axillary lymph nodes. Thyroid gland, trachea, and esophagus demonstrate no significant findings. Lungs/Pleura: There are bilateral pleural effusions, less than 1 L each. There are areas of dependent atelectasis within the bilateral lower lobes, with likely superimposed airspace disease seen within the non dependent left lower lobe. Minimal patchy ground-glass airspace disease is seen within the right apex. No evidence of pneumothorax. 5 mm right middle lobe pulmonary nodule image 76/5. No other pulmonary nodules or masses. The central airways are patent. Upper Abdomen: No acute abnormality. Musculoskeletal: No acute or destructive bony lesions. Reconstructed images demonstrate progressive spondylosis at the T8/T9 level, with complete loss of disc space, eburnation, and circumferential osteophyte formation. 4 mm of anterolisthesis of T8 on T9 has developed in the interim. IMPRESSION: 1. Bilateral pleural effusions, with mild compressive atelectasis of the dependent lower lobes. 2. Consolidation within the non dependent left lower lobe compatible with airspace disease and pneumonia. Patchy ground-glass right upper lobe airspace disease also likely infectious. 3. 5 mm right middle lobe pulmonary nodule. No follow-up needed if patient is low-risk. Non-contrast chest CT can be considered in 12 months if patient is high-risk. This recommendation follows the consensus  statement: Guidelines for Management of Incidental Pulmonary Nodules Detected on CT Images: From the Fleischner Society 2017; Radiology 2017; 284:228-243. 4. Progressive spondylosis at T8/T9. 5. Aortic Atherosclerosis (ICD10-I70.0). Coronary artery atherosclerosis. Electronically Signed   By: Randa Ngo M.D.   On: 07/08/2020 20:42        Scheduled Meds: . heparin  5,000 Units Subcutaneous Q8H  . insulin aspart  0-5 Units Subcutaneous QHS  . insulin aspart  0-9 Units Subcutaneous TID WC   Continuous Infusions: . azithromycin 500 mg (07/09/20 1845)  . cefTRIAXone (ROCEPHIN)  IV 2 g (07/09/20 1739)     LOS: 0 days   Time spent: 89mins, case discussed  with cardiology and nephrology Greater than 50% of this time was spent in counseling, explanation of diagnosis, planning of further management, and coordination of care.  I have personally reviewed and interpreted on  07/10/2020 daily labs, imagings as discussed above under date review session and assessment and plans.  I reviewed all nursing notes, pharmacy notes, consultant notes,  vitals, pertinent old records  I have discussed plan of care as described above with RN , patient on 07/10/2020  Voice Recognition /Dragon dictation system was used to create this note, attempts have been made to correct errors. Please contact the author with questions and/or clarifications.   Florencia Reasons, MD PhD FACP [facility]  Available via Epic secure chat 7am-7pm for nonurgent issues Please page for urgent issues To page the attending provider between 7A-7P or the covering provider during after hours 7P-7A, please log into the web site [email or web address] and access using universal [place] password for that web site. If you do not have the password, please call the hospital operator.    07/10/2020, 11:08 AM

## 2020-07-10 NOTE — Consult Note (Signed)
Maryla Morrow Admit Date: 07/08/2020 07/10/2020 [place] Agent Requesting Physician:  Marylyn Ishihara DO  Reason for Consult:  AKI and/or CKD progression HPI:  36M PMH DM2, CKD, CAD presented with cough/SOB/orthopnea for 5d did not improve with outpt ABX.  Presented to ED, CT chest with b/l pleural effusions, LLL consolidation suggestive of CAP.  Started on CTX/Azithro and diuresis.    Presenting SCR 3.5, up to 3.7 today.  Last available SCR 2.6 in 05/2019.  [facility].  He tells me his last eGFR was 18, and is 16 here.  Has discussed HD but no formal plan or vascular access pursued. Working Dx of CKD is HTN/DM.  UA w/o hematuria, pyuria, but 4+ proteinuria is present.  Made 3L UOP yesterday on 40 IV lasix x1 .    Home meds include ARB, lasix, Calcitriol.  TTE pending.   Today lying flat in bed on RA w/o dyspnea. Feels well.  Afebrile. No inc WBC.    Creatinine, Ser (mg/dL)  Date Value  07/10/2020 3.74 (H)  07/09/2020 3.56 (H)  07/08/2020 3.52 (H)  05/07/2019 2.55 (H)  04/29/2019 2.28 (H)  11/15/2017 2.04 (H)  04/30/2017 2.07 (H)  04/17/2013 1.30  10/13/2012 1.18  10/12/2012 1.29  ] I/Os: I/O last 3 completed shifts: In: 649.9 [P.O.:200; IV Piggyback:449.9] Out: 5170 [Urine:3750]   ROS Balance of 12 systems is negative w/ exceptions as above  PMH  Past Medical History:  Diagnosis Date  . Arthritis   . Bell's palsy    one episode  . CKD (chronic kidney disease)    sees [place] in [place]  . Diabetes ([place])    type 2   sees endo at [place] in [place]  . Hypercholesteremia   . Hypertension   . Myocardial infarction ([place]) 1990  . Neuromuscular disorder ([place])    [place]  Past Surgical History:  Procedure Laterality Date  . BACK SURGERY  yrs ago   lower  . CARDIAC CATHETERIZATION    . CHOLECYSTECTOMY N/A 05/02/2017   Procedure: LAPAROSCOPIC CHOLECYSTECTOMY;  Surgeon: Ralene Ok, MD;  Location: [facility];  Service: General;  Laterality: N/A;  . CORONARY  ANGIOPLASTY     2 1990  . KNEE SURGERY Left yrs ago   arthroscopic  . ROTATOR CUFF REPAIR Left yrs ago  . SHOULDER OPEN ROTATOR CUFF REPAIR Right 04/23/2013   Procedure: RIGHT SHOULDER ROTATOR CUFF REPAIR WITH GRAFT AND ANCHORS ;  Surgeon: Tobi Bastos, MD;  Location: [facility];  Service: Orthopedics;  Laterality: Right;  . TONSILLECTOMY    . TOTAL HIP ARTHROPLASTY Right 05/06/2019   Procedure: TOTAL HIP ARTHROPLASTY ANTERIOR APPROACH;  Surgeon: Paralee Cancel, MD;  Location: [facility];  Service: Orthopedics;  Laterality: Right;  70 mins   FH  Family History  Problem Relation Age of Onset  . Dementia Mother   . Stroke Brother    [place]  reports that he quit smoking about 32 years ago. His smoking use included cigars. He quit after 10.00 years of use. He has never used smokeless tobacco. He reports previous alcohol use. He reports that he does not use drugs. Allergies  Allergies  Allergen Reactions  . Lisinopril Cough  . Hydrocodone     Other reaction(s): Nausea  . Liraglutide     Other reaction(s): GI upset  . Prednisone     Elevates BGL; patient prefers to not take this  . Sitagliptin     Other reaction(s): Unknown  . Penicillins Rash  and Other (See Comments)    Rash around ankles Has patient had a PCN reaction causing immediate rash, facial/tongue/throat swelling, SOB or lightheadedness with hypotension: Yes Has patient had a PCN reaction causing severe rash involving mucus membranes or skin necrosis: Unk Has patient had a PCN reaction that required hospitalization: No Has patient had a PCN reaction occurring within the last 10 years: No If all of the above answers are "NO", then may proceed with Cephalosporin use.    Home medications Prior to Admission medications   Medication Sig Start Date End Date Taking? Authorizing Provider  allopurinol (ZYLOPRIM) 100 MG tablet Take 100 mg by mouth daily.   Yes [provider]  aspirin EC 81 MG tablet Take 81 mg by mouth daily.    Yes [provider]  atorvastatin (LIPITOR) 40 MG tablet Take 40 mg by mouth every evening.   Yes [provider]  diltiazem (TIAZAC) 420 MG 24 hr capsule Take 420 mg by mouth daily.   Yes [provider]  finasteride (PROSCAR) 5 MG tablet Take 5 mg by mouth daily.   Yes [provider]  insulin aspart protamine - aspart (NOVOLOG MIX 70/30 FLEXPEN) (70-30) 100 UNIT/ML FlexPen Inject 0-20 Units into the skin as directed. If BS is <180=0 units, If BS is 180-200 units=14 units, If BS is >201=20 units   Yes [provider]  levofloxacin (LEVAQUIN) 750 MG tablet Take 750 mg by mouth daily. 10 day supply 07/06/20  Yes [provider]  losartan (COZAAR) 100 MG tablet Take 100 mg by mouth every evening.    Yes [provider]  terazosin (HYTRIN) 10 MG capsule Take 10 mg by mouth at bedtime.    Yes [provider]  acetaminophen (TYLENOL) 500 MG tablet Take 1,000 mg by mouth every 6 (six) hours as needed for moderate pain or headache.     [provider]  calcitRIOL (ROCALTROL) 0.25 MCG capsule Take 0.25 mcg by mouth 2 (two) times a week.    [provider]  ferrous sulfate (FERROUSUL) 325 (65 FE) MG tablet Take 1 tablet (325 mg total) by mouth 3 (three) times daily with meals for 14 days. Patient taking differently: Take 325 mg by mouth 2 (two) times a week. 05/07/19 07/30/19  Danae Orleans, PA-C  fluticasone (FLONASE) 50 MCG/ACT nasal spray Place 1 spray into both nostrils daily as needed for allergies or rhinitis.    [provider]  furosemide (LASIX) 40 MG tablet Take 40 mg by mouth daily.    [provider]  insulin aspart protamine- aspart (NOVOLOG MIX 70/30) (70-30) 100 UNIT/ML injection Inject 20 Units into the skin 2 (two) times daily as needed. PRN 180-200 (14 units) >200 (20 units)    [provider]  traMADol (ULTRAM) 50 MG tablet Take 50 mg by mouth at bedtime as needed. 03/13/19    [provider]    Current Medications Scheduled Meds: . furosemide  40 mg Intravenous Daily  . heparin  5,000 Units Subcutaneous Q8H  . insulin aspart  0-5 Units Subcutaneous QHS  . insulin aspart  0-9 Units Subcutaneous TID WC   Continuous Infusions: . azithromycin 500 mg (07/09/20 1845)  . cefTRIAXone (ROCEPHIN)  IV 2 g (07/09/20 1739)   PRN Meds:.acetaminophen **OR** acetaminophen, albuterol, hydrALAZINE, ondansetron **OR** ondansetron (ZOFRAN) IV  CBC Recent Labs  Lab 07/08/20 1859 07/09/20 0527 07/10/20 0557  WBC 6.6 6.2 6.8  HGB 10.4* 10.8* 11.4*  HCT 29.8* 30.4* 33.2*  MCV 92.8 92.1 93.8  PLT 177 152 677   Basic Metabolic Panel Recent Labs  Lab 07/08/20 1859 07/09/20 0527 07/10/20 0557  NA 133* 134* 139  K 4.3 4.2 4.6  CL 107 107 107  CO2 16* 17* 22  GLUCOSE 207* 163* 124*  BUN 45* 46* 43*  CREATININE 3.52* 3.56* 3.74*  CALCIUM 8.4* 8.5* 8.8*    Physical Exam  Blood pressure (!) 173/75, pulse 84, temperature 98.3 F (36.8 C), temperature source Oral, resp. rate 17, height _0  (1.6 m), weight 79.4 kg, SpO2 97 %. GEN: NAD, lying flat in bed nl WOB, speaks in full sentences ENT: NCAT EYES: EOMI CV: RRR nl s1s2 no rub PULM: CTAB ABD: s/nt/nd SKIN: no rashes/lesions EXT: no sig LEE  Assessment 54M CKD4 with CAP, ? CHF exacerbation  1. CKD4:  Seems he is near baseline, not sure there is an AKI component.  Great UOP, stable labs; no reason to consider RRT here 2. CAP on ABX per TRH, seems improved 3. ? CHF exacerbation, TTE pending, great response to lasix 4. DM2 5. HTN  Plan 1. Can cont lasix and return to home dosing 2. Cont outpt f/u with [facility] 3. No further suggestions at this time 4. Will sign off for now.  Please call with any questions or concerns.  Pt does need follow up with nephrology and he can arrange through [place]  07/10/2020, 10:54 AM

## 2020-07-10 NOTE — Progress Notes (Signed)
  Echocardiogram 2D Echocardiogram has been performed.  Randa Lynn Kiera Hussey 07/10/2020, 8:39 AM

## 2020-07-11 DIAGNOSIS — E119 Type 2 diabetes mellitus without complications: Secondary | ICD-10-CM | POA: Diagnosis not present

## 2020-07-11 DIAGNOSIS — I5021 Acute systolic (congestive) heart failure: Secondary | ICD-10-CM | POA: Diagnosis not present

## 2020-07-11 DIAGNOSIS — N184 Chronic kidney disease, stage 4 (severe): Secondary | ICD-10-CM | POA: Diagnosis not present

## 2020-07-11 DIAGNOSIS — J181 Lobar pneumonia, unspecified organism: Secondary | ICD-10-CM | POA: Diagnosis not present

## 2020-07-11 LAB — BASIC METABOLIC PANEL
Anion gap: 10 (ref 5–15)
BUN: 50 mg/dL — ABNORMAL HIGH (ref 8–23)
CO2: 22 mmol/L (ref 22–32)
Calcium: 8.5 mg/dL — ABNORMAL LOW (ref 8.9–10.3)
Chloride: 104 mmol/L (ref 98–111)
Creatinine, Ser: 3.98 mg/dL — ABNORMAL HIGH (ref 0.61–1.24)
GFR, Estimated: 14 mL/min — ABNORMAL LOW (ref >60)
Glucose, Bld: 118 mg/dL — ABNORMAL HIGH (ref 70–99)
Potassium: 4.2 mmol/L (ref 3.5–5.1)
Sodium: 136 mmol/L (ref 135–145)

## 2020-07-11 LAB — GLUCOSE, CAPILLARY: Glucose-Capillary: 116 mg/dL — ABNORMAL HIGH (ref 70–99)

## 2020-07-11 MED ORDER — CEFDINIR 300 MG PO CAPS: 300.0000 mg | ORAL_CAPSULE | Freq: Every day | ORAL | 0 refills | Status: DC

## 2020-07-11 MED ORDER — LOSARTAN POTASSIUM 25 MG PO TABS: 25.0000 mg | ORAL_TABLET | Freq: Every day | ORAL | 0 refills | Status: DC

## 2020-07-11 MED ORDER — METOPROLOL SUCCINATE ER 50 MG PO TB24: 50.0000 mg | ORAL_TABLET | Freq: Every day | ORAL | 0 refills | Status: DC

## 2020-07-11 MED ORDER — TAMSULOSIN HCL 0.4 MG PO CAPS: 0.4000 mg | ORAL_CAPSULE | Freq: Every day | ORAL | 0 refills | Status: DC

## 2020-07-11 MED ORDER — HYDRALAZINE HCL 25 MG PO TABS: 25.0000 mg | ORAL_TABLET | Freq: Three times a day (TID) | ORAL | 0 refills | Status: DC

## 2020-07-11 MED ORDER — ISOSORBIDE MONONITRATE ER 30 MG PO TB24: 15.0000 mg | ORAL_TABLET | Freq: Every day | ORAL | 0 refills | Status: DC

## 2020-07-11 MED ORDER — AZITHROMYCIN 250 MG PO TABS: 500.0000 mg | ORAL_TABLET | Freq: Every day | ORAL | 0 refills | Status: DC

## 2020-07-11 NOTE — Discharge Summary (Addendum)
Discharge Summary  Robert Lucas WCH:[id number] DOB: 1940/10/19  PCP: [name], NP  Admit date: 07/08/2020 Discharge date: 07/11/2020  Time spent: 47mins, more than 50% time spent on coordination of care.   Recommendations for Outpatient Follow-up:  1. F/u with PCP within a week  for hospital discharge follow up, repeat cbc/bmp at follow up 2. F/u with cardiology Dr. Virgina Jock next week 3. Follow-up with nephrology, patient report has nephrology appointment that we a on January 20 at which time he will see his nephrology to refer him to Dr. Joelyn Oms 4. patient is advised to check blood pressure twice a day and check daily weight and  bring in record for cardiology to review 5. Need to check BMP next week monitor renal function   Discharge Diagnoses:  Active Hospital Problems   Diagnosis Date Noted  . Lobar pneumonia ([place]) 07/10/2020  . CAP (community acquired pneumonia) 07/08/2020    Resolved Hospital Problems  No resolved problems to display.    Discharge Condition: stable  Diet recommendation: heart healthy/carb modified  Filed Weights   07/08/20 1832  Weight: 79.4 kg    History of present illness:  H/o pertension, hyperlipidemia, IDDM, CAD s/p anterior MI in 1990s treated wiuth TPA and PTCA, CKD 4,Presenting with dyspnea and cough for last 5 days. He reports that it started as shortness of breath and a non-productive cough. He tried some OTC meds, but it didn't help. He saw his PCP 4 days ago and was given an antibiotic to take. He noticed over the next several days that his symptoms were not improving and that he was having to sleep in a more upright position in order to breath. He called his PCP again and was told to come to the ED  Hospital Course:  Active Problems:   CAP (community acquired pneumonia)   Lobar pneumonia ([place])   Lobar pneumonia, left lower lobe -CT chest personally reviewed showed bilateral pleural effusion and possible left lower lobe  infiltrate -No fever, no leukocytosis -treated with levaquin prior to hospitalization, given rocephin and zithromax since admission, discharged on omnicef and zithro for one more day to finish treatment course.   Acute systolic heart failure/bilateral pleural effusions  -lvEF 55% in August 2020  - echo this hospitalization showed EF reduced to 25% with left ventricular hypertrophy and grade 1 diastolic dysfunction -Responded to IV Lasix with good urine output -he is euvolemic, denies chest pain , no short of breath, no edema, changed to oral Lasix - high sensitivity  troponin mild and flat, repeat EKG no interval changes -Case discussed with cardiology Dr. Einar Gip, as patient is asymptomatic, volume status is reasonable, patient can follow-up with cardiology next week on outpatient basis, -Dr Einar Gip recommend discontinue patient home medication Cardizem, recommend start metoprolol succinate, bidil (substitue to imdur and hydralazine due to cost concern per patient's request), reduce losartan - Dr. Einar Gip will arrange appointment next week with Dr Virgina Jock  HTN:  Blood pressure adjustment as mentioned above DC Cardizem Reduce losartan Start metoprolol succinate Start bidil (hydralazine and Imdur) Continue home dose Lasix Patient to check blood pressure at home, bring in record to hospital discharge follow-up appointment  CAD -s/p anterior MI in 1990s treated wiuth TPA and PTCA, -Currently denies chest pain , no short of breath, reports no activity limitation at baseline -No dyspnea on exertion at baseline either -Continue aspirin, statin -Started on beta-blocker, hydralazine Imdur,   Insulin-dependent type 2 diabetes, fairly controlled -A1c 6.3 -A.m. fasting blood glucose 124 -  Continue sliding scale insulin  CKD 4 -Creatinine baseline -Nephrology input appreciated, patient used to follow with [facility], he prefers to switch to Dr. Joelyn Oms -Repeat renal function next  week  5 mm right middle lobe pulmonary nodule. No follow-up needed if patient is low-risk. Non-contrast chest CT can be considered in 12 months if patient is high-risk. This recommendation follows the consensus statement: Guidelines for Management of Incidental Pulmonary Nodules Detected on CT Images: From the Fleischner Society 2017; Radiology 2017; 284:228-243. F/u with pcp  BPH Continue Proscar, Flomax  History of gout Continue allopurinol   DVT prophylaxis while in the hospital: heparin injection 5,000 Units Start: 07/09/20 2200  Code Status: Full Family Communication: Patient Disposition:  Home     Consultants:   Cardiology Dr. Einar Gip  Nephrology Dr. Joelyn Oms  Procedures:   None  Antimicrobials:   Anti-infectives (From admission, onward)   Start     Dose/Rate Route Frequency Ordered Stop   07/12/20 0000  azithromycin (ZITHROMAX) 250 MG tablet        500 mg Oral Daily 07/11/20 0901 07/13/20 2359   07/12/20 0000  cefdinir (OMNICEF) 300 MG capsule        300 mg Oral Daily 07/11/20 0901     07/11/20 0000  azithromycin (ZITHROMAX) 250 MG tablet  Status:  Discontinued        500 mg Oral Daily 07/11/20 0854 07/11/20    07/11/20 0000  cefdinir (OMNICEF) 300 MG capsule  Status:  Discontinued        300 mg Oral Daily at bedtime 07/11/20 0854 07/11/20    07/11/20 0000  cefdinir (OMNICEF) 300 MG capsule  Status:  Discontinued        300 mg Oral Daily 07/11/20 0900 07/11/20    07/10/20 2200  cefdinir (OMNICEF) capsule 300 mg        300 mg Oral Daily at bedtime 07/10/20 1547     07/10/20 1800  azithromycin (ZITHROMAX) tablet 500 mg        500 mg Oral Daily 07/10/20 1547     07/09/20 1800  cefTRIAXone (ROCEPHIN) 2 g in sodium chloride 0.9 % 100 mL IVPB  Status:  Discontinued        2 g 200 mL/hr over 30 Minutes Intravenous Every 24 hours 07/09/20 1541 07/10/20 1547   07/09/20 1800  azithromycin (ZITHROMAX) 500 mg in sodium chloride 0.9 % 250 mL IVPB   Status:  Discontinued        500 mg 250 mL/hr over 60 Minutes Intravenous Every 24 hours 07/09/20 1541 07/10/20 1547   07/08/20 2015  cefTRIAXone (ROCEPHIN) 1 g in sodium chloride 0.9 % 100 mL IVPB        1 g 200 mL/hr over 30 Minutes Intravenous  Once 07/08/20 2007 07/09/20 0252   07/08/20 2015  doxycycline (VIBRA-TABS) tablet 100 mg        100 mg Oral  Once 07/08/20 2007 07/08/20 2038      Discharge Exam: BP (!) 141/71 (BP Location: Left Arm)   Pulse 66   Temp 97.9 F (36.6 C) (Oral)   Resp 18   Ht 5\' 3"  (1.6 m)   Wt 79.4 kg   SpO2 99%   BMI 31.00 kg/m   General: NAD, pleasant Cardiovascular: RRR, no pedal edema Respiratory: Clear to auscultation bilaterally  Discharge Instructions You were cared for by a hospitalist during your hospital stay. If you have any questions about your discharge medications or the care you received while  you were in the hospital after you are discharged, you can call the unit and asked to speak with the hospitalist on call if the hospitalist that took care of you is not available. Once you are discharged, your primary care physician will handle any further medical issues. Please note that NO REFILLS for any discharge medications will be authorized once you are discharged, as it is imperative that you return to your primary care physician (or establish a relationship with a primary care physician if you do not have one) for your aftercare needs so that they can reassess your need for medications and monitor your lab values.  Discharge Instructions    Diet - low sodium heart healthy   Complete by: As directed    Carb modified diet   Discharge instructions   Complete by: As directed    Please check your blood pressure twice a day, bring in record for your doctor to review. Please weight yourself daily, record your daily weight, bring in record for your doctor to review. Please call your doctor if you have weight gain of more than 3lbs in three  days Please check your feet and legs for edema, please call your doctor if increased edema   Increase activity slowly   Complete by: As directed      Allergies as of 07/11/2020      Reactions   Lisinopril Cough   Hydrocodone    Other reaction(s): Nausea   Liraglutide    Other reaction(s): GI upset   Prednisone    Elevates BGL; patient prefers to not take this   Sitagliptin    Other reaction(s): Unknown   Penicillins Rash, Other (See Comments)   Rash around ankles Has patient had a PCN reaction causing immediate rash, facial/tongue/throat swelling, SOB or lightheadedness with hypotension: Yes Has patient had a PCN reaction causing severe rash involving mucus membranes or skin necrosis: Unk Has patient had a PCN reaction that required hospitalization: No Has patient had a PCN reaction occurring within the last 10 years: No If all of the above answers are "NO", then may proceed with Cephalosporin use.      Medication List    STOP taking these medications   diltiazem 420 MG 24 hr capsule Commonly known as: TIAZAC   levofloxacin 750 MG tablet Commonly known as: LEVAQUIN   terazosin 10 MG capsule Commonly known as: HYTRIN     TAKE these medications   acetaminophen 500 MG tablet Commonly known as: TYLENOL Take 1,000 mg by mouth every 6 (six) hours as needed for moderate pain or headache.   allopurinol 100 MG tablet Commonly known as: ZYLOPRIM Take 100 mg by mouth daily.   aspirin EC 81 MG tablet Take 81 mg by mouth daily.   atorvastatin 40 MG tablet Commonly known as: LIPITOR Take 40 mg by mouth every evening.   azithromycin 250 MG tablet Commonly known as: ZITHROMAX Take 2 tablets (500 mg total) by mouth daily for 1 day. Start taking on: July 12, 2020   calcitRIOL 0.25 MCG capsule Commonly known as: ROCALTROL Take 0.25 mcg by mouth every Monday, Wednesday, and Friday.   cefdinir 300 MG capsule Commonly known as: OMNICEF Take 1 capsule (300 mg total) by  mouth daily. Start taking on: July 12, 2020   finasteride 5 MG tablet Commonly known as: PROSCAR Take 5 mg by mouth daily.   fluticasone 50 MCG/ACT nasal spray Commonly known as: FLONASE Place 1 spray into both nostrils daily as needed for  allergies or rhinitis.   furosemide 40 MG tablet Commonly known as: LASIX Take 40 mg by mouth daily.   hydrALAZINE 25 MG tablet Commonly known as: APRESOLINE Take 1 tablet (25 mg total) by mouth every 8 (eight) hours.   isosorbide mononitrate 30 MG 24 hr tablet Commonly known as: IMDUR Take 0.5 tablets (15 mg total) by mouth daily.   losartan 25 MG tablet Commonly known as: Cozaar Take 1 tablet (25 mg total) by mouth daily after supper. What changed:   medication strength  how much to take  when to take this   metoprolol succinate 50 MG 24 hr tablet Commonly known as: TOPROL-XL Take 1 tablet (50 mg total) by mouth daily. Take with or immediately following a meal.   NovoLOG Mix 70/30 FlexPen (70-30) 100 UNIT/ML FlexPen Generic drug: insulin aspart protamine - aspart Inject 0-20 Units into the skin as directed. If BS is <180=0 units, If BS is 180-200 units=14 units, If BS is >201=20 units What changed: Another medication with the same name was removed. Continue taking this medication, and follow the directions you see here.   tamsulosin 0.4 MG Caps capsule Commonly known as: FLOMAX Take 1 capsule (0.4 mg total) by mouth daily after supper.   traMADol 50 MG tablet Commonly known as: ULTRAM Take 50 mg by mouth at bedtime as needed.      Allergies  Allergen Reactions  . Lisinopril Cough  . Hydrocodone     Other reaction(s): Nausea  . Liraglutide     Other reaction(s): GI upset  . Prednisone     Elevates BGL; patient prefers to not take this  . Sitagliptin     Other reaction(s): Unknown  . Penicillins Rash and Other (See Comments)    Rash around ankles Has patient had a PCN reaction causing immediate rash,  facial/tongue/throat swelling, SOB or lightheadedness with hypotension: Yes Has patient had a PCN reaction causing severe rash involving mucus membranes or skin necrosis: Unk Has patient had a PCN reaction that required hospitalization: No Has patient had a PCN reaction occurring within the last 10 years: No If all of the above answers are "NO", then may proceed with Cephalosporin use.     Follow-up Information    [name], NP Follow up in 1 week(s).   Specialty: Nurse Practitioner Contact information: [place] [address] [phone]        Nigel Mormon, MD Follow up in 1 week(s).   Specialties: Cardiology, Radiology Contact information: [place] [address] [phone]        Rexene Agent, MD Follow up.   Specialty: Nephrology Contact information: [place] [address] [phone]        repeat labs next week to monitor kidney function Follow up.        pcp to follow up on 5 mm right middle lobe pulmonary nodule Follow up.   Why: Non-contrast chest CT can be considered in 12 months if  high-risk               The results of significant diagnostics from this hospitalization (including imaging, microbiology, ancillary and laboratory) are listed below for reference.    Significant Diagnostic Studies: DG Chest 2 View  Result Date: 07/08/2020 CLINICAL DATA:  Short of breath for 3 days, hypertension, diabetes EXAM: CHEST - 2 VIEW COMPARISON:  11/15/2017 FINDINGS: Frontal and lateral views of the chest demonstrate a stable cardiac silhouette. There is  left lower lobe airspace disease with small left pleural effusion, consistent with underlying pneumonia. Right chest is clear. No pneumothorax. No acute bony abnormalities. IMPRESSION: 1. Left lower lobe consolidation compatible with pneumonia. 2. Small left parapneumonic effusion. Electronically Signed   By: Randa Ngo M.D.   On:  07/08/2020 19:03   CT Chest Wo Contrast  Result Date: 07/08/2020 CLINICAL DATA:  Short of breath for 4 days, left lower lobe pneumonia EXAM: CT CHEST WITHOUT CONTRAST TECHNIQUE: Multidetector CT imaging of the chest was performed following the standard protocol without IV contrast. COMPARISON:  07/08/2020 FINDINGS: Cardiovascular: Unenhanced images of the heart demonstrate trace pericardial effusion. The heart is not enlarged. There is extensive atherosclerosis of the coronary vasculature. The thoracic aorta is normal in caliber, with diffuse atherosclerosis. Mediastinum/Nodes: No enlarged mediastinal or axillary lymph nodes. Thyroid gland, trachea, and esophagus demonstrate no significant findings. Lungs/Pleura: There are bilateral pleural effusions, less than 1 L each. There are areas of dependent atelectasis within the bilateral lower lobes, with likely superimposed airspace disease seen within the non dependent left lower lobe. Minimal patchy ground-glass airspace disease is seen within the right apex. No evidence of pneumothorax. 5 mm right middle lobe pulmonary nodule image 76/5. No other pulmonary nodules or masses. The central airways are patent. Upper Abdomen: No acute abnormality. Musculoskeletal: No acute or destructive bony lesions. Reconstructed images demonstrate progressive spondylosis at the T8/T9 level, with complete loss of disc space, eburnation, and circumferential osteophyte formation. 4 mm of anterolisthesis of T8 on T9 has developed in the interim. IMPRESSION: 1. Bilateral pleural effusions, with mild compressive atelectasis of the dependent lower lobes. 2. Consolidation within the non dependent left lower lobe compatible with airspace disease and pneumonia. Patchy ground-glass right upper lobe airspace disease also likely infectious. 3. 5 mm right middle lobe pulmonary nodule. No follow-up needed if patient is low-risk. Non-contrast chest CT can be considered in 12 months if patient is  high-risk. This recommendation follows the consensus statement: Guidelines for Management of Incidental Pulmonary Nodules Detected on CT Images: From the Fleischner Society 2017; Radiology 2017; 284:228-243. 4. Progressive spondylosis at T8/T9. 5. Aortic Atherosclerosis (ICD10-I70.0). Coronary artery atherosclerosis. Electronically Signed   By: Randa Ngo M.D.   On: 07/08/2020 20:42   ECHOCARDIOGRAM COMPLETE  Result Date: 07/10/2020    ECHOCARDIOGRAM REPORT   Patient Name:   Robert Lucas Date of Exam: 07/10/2020 Medical Rec #:  [id number]       Height:       63.0 in Accession #:    [id number]      Weight:       175.0 lb Date of Birth:  [birth year]      BSA:          1.827 m Patient Age:    80 years        BP:           173/75 mmHg Patient Gender: M               HR:           77 bpm. Exam Location:  Inpatient Procedure: 2D Echo, Cardiac Doppler and Color Doppler Indications:    I50.23 Acute on chronic systolic (congestive) heart failure  History:        Patient has prior history of Echocardiogram examinations.                 Previous Myocardial Infarction; Risk Factors:Hypertension,  Dyslipidemia and Diabetes. CKD. Bell's Palsy.  Sonographer:    Tiffany Dance Referring Phys: [id number] [place]  1. Left ventricular ejection fraction, by estimation, is 20 to 25%. The left ventricle has severely decreased function. There is mild left ventricular hypertrophy. Left ventricular diastolic parameters are consistent with Grade I diastolic dysfunction (impaired relaxation).  2. Right ventricular systolic function is normal. The right ventricular size is normal.  3. Left atrial size was severely dilated.  4. The mitral valve is abnormal. Trivial mitral valve regurgitation.  5. The aortic valve is abnormal. Aortic valve regurgitation is trivial. Mild to moderate aortic valve sclerosis/calcification is present, without any evidence of aortic stenosis.  6. The inferior vena cava is dilated in  size with >50% respiratory variability, suggesting right atrial pressure of 8 mmHg. FINDINGS  Left Ventricle: LVEF is severely depressed There is severe hypokinesis/akinesis of the basal infeiror, apical, mid/distal anterior, septal; lateral walls. Left ventricular ejection fraction, by estimation, is 20 to 25%. The left ventricle has severely decreased function. The left ventricle demonstrates regional wall motion abnormalities. The left ventricular internal cavity size was normal in size. There is mild left ventricular hypertrophy. Left ventricular diastolic parameters are consistent with Grade I diastolic dysfunction (impaired relaxation). Right Ventricle: The right ventricular size is normal. Right vetricular wall thickness was not assessed. Right ventricular systolic function is normal. Left Atrium: Left atrial size was severely dilated. Right Atrium: Right atrial size was normal in size. Pericardium: Trivial pericardial effusion is present. Mitral Valve: The mitral valve is abnormal. There is mild thickening of the mitral valve leaflet(s). Mild mitral annular calcification. Trivial mitral valve regurgitation. Tricuspid Valve: The tricuspid valve is normal in structure. Tricuspid valve regurgitation is trivial. Aortic Valve: The aortic valve is abnormal. Aortic valve regurgitation is trivial. Mild to moderate aortic valve sclerosis/calcification is present, without any evidence of aortic stenosis. Aortic valve mean gradient measures 8.0 mmHg. Aortic valve peak gradient measures 14.1 mmHg. Aortic valve area, by VTI measures 1.98 cm. Pulmonic Valve: The pulmonic valve was normal in structure. Pulmonic valve regurgitation is not visualized. Aorta: The aortic root is normal in size and structure. Venous: The inferior vena cava is dilated in size with greater than 50% respiratory variability, suggesting right atrial pressure of 8 mmHg. IAS/Shunts: The interatrial septum was not assessed.  LEFT VENTRICLE PLAX 2D  LVIDd:         5.10 cm LVIDs:         4.50 cm LV PW:         1.50 cm LV IVS:        0.80 cm LVOT diam:     2.30 cm LV SV:         81 LV SV Index:   44 LVOT Area:     4.15 cm  RIGHT VENTRICLE             IVC RV Basal diam:  2.50 cm     IVC diam: 2.30 cm RV S prime:     14.70 cm/s TAPSE (M-mode): 1.7 cm LEFT ATRIUM              Index       RIGHT ATRIUM           Index LA diam:        4.50 cm  2.46 cm/m  RA Area:     10.70 cm LA Vol (A2C):   115.0 ml 62.95 ml/m RA Volume:   21.90 ml  11.99 ml/m LA Vol (A4C):   110.0 ml 60.21 ml/m LA Biplane Vol: 116.0 ml 63.49 ml/m  AORTIC VALVE AV Area (Vmax):    1.94 cm AV Area (Vmean):   1.95 cm AV Area (VTI):     1.98 cm AV Vmax:           188.00 cm/s AV Vmean:          131.000 cm/s AV VTI:            0.407 m AV Peak Grad:      14.1 mmHg AV Mean Grad:      8.0 mmHg LVOT Vmax:         87.60 cm/s LVOT Vmean:        61.400 cm/s LVOT VTI:          0.194 m LVOT/AV VTI ratio: 0.48  AORTA Ao Root diam: 3.10 cm Ao Asc diam:  3.40 cm MITRAL VALVE MV Area (PHT): 3.21 cm    SHUNTS MV Decel Time: 236 msec    Systemic VTI:  0.19 m MV E velocity: 61.60 cm/s  Systemic Diam: 2.30 cm MV A velocity: 91.30 cm/s MV E/A ratio:  0.67 Dorris Carnes MD Electronically signed by Dorris Carnes MD Signature Date/Time: 07/10/2020/1:50:12 PM    Final     Microbiology: Recent Results (from the past 240 hour(s))  SARS CORONAVIRUS 2 (TAT 6-24 HRS) Nasopharyngeal Nasopharyngeal Swab     Status: None   Collection Time: 07/08/20  7:24 PM   Specimen: Nasopharyngeal Swab  Result Value Ref Range Status   SARS Coronavirus 2 NEGATIVE NEGATIVE Final    Comment: (NOTE) SARS-CoV-2 target nucleic acids are NOT DETECTED.  The SARS-CoV-2 RNA is generally detectable in upper and lower respiratory specimens during the acute phase of infection. Negative results do not preclude SARS-CoV-2 infection, do not rule out co-infections with other pathogens, and should not be used as the sole basis for treatment or  other patient management decisions. Negative results must be combined with clinical observations, patient history, and epidemiological information. The expected result is Negative.  Fact Sheet for Patients: SugarRoll.be  Fact Sheet for Healthcare Providers: https://www.woods-mathews.com/  This test is not yet approved or cleared by the [place] FDA and  has been authorized for detection and/or diagnosis of SARS-CoV-2 by FDA under an Emergency Use Authorization (EUA). This EUA will remain  in effect (meaning this test can be used) for the duration of the COVID-19 declaration under Se ction 564(b)(1) of the Act, 21 U.S.C. section 360bbb-3(b)(1), unless the authorization is terminated or revoked sooner.  Performed at [facility], [place] [address], [place], [place] [address]   Resp Panel by RT-PCR (Flu A&B, Covid) Nasopharyngeal Swab     Status: None   Collection Time: 07/08/20  9:34 PM   Specimen: Nasopharyngeal Swab; Nasopharyngeal(NP) swabs in vial transport medium  Result Value Ref Range Status   SARS Coronavirus 2 by RT PCR NEGATIVE NEGATIVE Final    Comment: (NOTE) SARS-CoV-2 target nucleic acids are NOT DETECTED.  The SARS-CoV-2 RNA is generally detectable in upper respiratory specimens during the acute phase of infection. The lowest concentration of SARS-CoV-2 viral copies this assay can detect is 138 copies/mL. A negative result does not preclude SARS-Cov-2 infection and should not be used as the sole basis for treatment or other patient management decisions. A negative result may occur with  improper specimen collection/handling, submission of specimen other than nasopharyngeal swab, presence of viral mutation(s) within the areas  targeted by this assay, and inadequate number of viral copies(<138 copies/mL). A negative result must be combined with clinical observations, patient history, and epidemiological information.  The expected result is Negative.  Fact Sheet for Patients:  EntrepreneurPulse.com.au  Fact Sheet for Healthcare Providers:  IncredibleEmployment.be  This test is no t yet approved or cleared by the [place] FDA and  has been authorized for detection and/or diagnosis of SARS-CoV-2 by FDA under an Emergency Use Authorization (EUA). This EUA will remain  in effect (meaning this test can be used) for the duration of the COVID-19 declaration under Section 564(b)(1) of the Act, 21 U.S.C.section 360bbb-3(b)(1), unless the authorization is terminated  or revoked sooner.       Influenza A by PCR NEGATIVE NEGATIVE Final   Influenza B by PCR NEGATIVE NEGATIVE Final    Comment: (NOTE) The Xpert Xpress SARS-CoV-2/FLU/RSV plus assay is intended as an aid in the diagnosis of influenza from Nasopharyngeal swab specimens and should not be used as a sole basis for treatment. Nasal washings and aspirates are unacceptable for Xpert Xpress SARS-CoV-2/FLU/RSV testing.  Fact Sheet for Patients: EntrepreneurPulse.com.au  Fact Sheet for Healthcare Providers: IncredibleEmployment.be  This test is not yet approved or cleared by the [place] FDA and has been authorized for detection and/or diagnosis of SARS-CoV-2 by FDA under an Emergency Use Authorization (EUA). This EUA will remain in effect (meaning this test can be used) for the duration of the COVID-19 declaration under Section 564(b)(1) of the Act, 21 U.S.C. section 360bbb-3(b)(1), unless the authorization is terminated or revoked.  Performed at [facility], [address] [place], [place] [address]      Labs: Basic Metabolic Panel: Recent Labs  Lab 07/08/20 1859 07/09/20 0527 07/10/20 0557 07/11/20 0549  NA 133* 134* 139 136  K 4.3 4.2 4.6 4.2  CL 107 107 107 104  CO2 16* 17* 22 22  GLUCOSE 207* 163* 124* 118*  BUN 45* 46* 43* 50*   CREATININE 3.52* 3.56* 3.74* 3.98*  CALCIUM 8.4* 8.5* 8.8* 8.5*   Liver Function Tests: Recent Labs  Lab 07/09/20 0527 07/10/20 0557  AST 27 30  ALT 15 18  ALKPHOS 82 91  BILITOT 0.7 0.5  PROT 6.2* 6.5  ALBUMIN 3.4* 3.4*   No results for input(s): LIPASE, AMYLASE in the last 168 hours. No results for input(s): AMMONIA in the last 168 hours. CBC: Recent Labs  Lab 07/08/20 1859 07/09/20 0527 07/10/20 0557  WBC 6.6 6.2 6.8  HGB 10.4* 10.8* 11.4*  HCT 29.8* 30.4* 33.2*  MCV 92.8 92.1 93.8  PLT 177 152 175   Cardiac Enzymes: No results for input(s): CKTOTAL, CKMB, CKMBINDEX, TROPONINI in the last 168 hours. BNP: BNP (last 3 results) Recent Labs    07/08/20 1859  BNP 398.7*    ProBNP (last 3 results) No results for input(s): PROBNP in the last 8760 hours.  CBG: Recent Labs  Lab 07/10/20 0812 07/10/20 1146 07/10/20 1618 07/10/20 2102 07/11/20 0734  GLUCAP 112* 147* 112* 141* 116*       Signed:  Florencia Reasons MD, PhD, FACP  [facility] 07/11/2020, 9:27 AM

## 2020-07-11 NOTE — Progress Notes (Signed)
Nurse reviewed discharge instructions with pt.  Pt verbalized understanding of discharge instructions, follow up appointments and new medications.  Pt given a dose of cefdinir prior to discharge.

## 2020-07-12 ENCOUNTER — Other Ambulatory Visit: Payer: Self-pay

## 2020-07-12 ENCOUNTER — Ambulatory Visit: Payer: Medicare Other | Admitting: Cardiology

## 2020-07-12 ENCOUNTER — Telehealth: Payer: Self-pay

## 2020-07-12 ENCOUNTER — Encounter: Payer: Self-pay | Admitting: Cardiology

## 2020-07-12 VITALS — BP 147/81 | HR 68 | Ht 63.0 in | Wt 170.0 lb

## 2020-07-12 DIAGNOSIS — I5023 Acute on chronic systolic (congestive) heart failure: Secondary | ICD-10-CM | POA: Insufficient documentation

## 2020-07-12 DIAGNOSIS — I10 Essential (primary) hypertension: Secondary | ICD-10-CM

## 2020-07-12 DIAGNOSIS — I5020 Unspecified systolic (congestive) heart failure: Secondary | ICD-10-CM

## 2020-07-12 DIAGNOSIS — I2510 Atherosclerotic heart disease of native coronary artery without angina pectoris: Secondary | ICD-10-CM | POA: Diagnosis not present

## 2020-07-12 LAB — LEGIONELLA PNEUMOPHILA SEROGP 1 UR AG: L. pneumophila Serogp 1 Ur Ag: NEGATIVE

## 2020-07-12 MED ORDER — BIDIL 20-37.5 MG PO TABS: 1.0000 | ORAL_TABLET | Freq: Three times a day (TID) | ORAL | 3 refills | Status: DC

## 2020-07-12 MED ORDER — FUROSEMIDE 40 MG PO TABS: 40.0000 mg | ORAL_TABLET | Freq: Every day | ORAL | 2 refills | Status: DC | PRN

## 2020-07-12 NOTE — Telephone Encounter (Signed)
-----   Message from Adrian Prows, MD sent at 07/10/2020  3:34 PM EST ----- Regarding: Needs urgent OV  TOC Monday and OV. New cardiomyopathy. EF 20%.  JG

## 2020-07-12 NOTE — Telephone Encounter (Signed)
Location of hospitalization: [place] Reason for hospitalization: [place]nem[place], Cardiomyopathy Date of discharge: 07/12/19 Date of first communication with patient: today Person contacting patient: Baxter Flattery Current symptoms: NA Do you understand why you were in the Hospital: Yes Questions regarding discharge instructions: None Where were you discharged to: Home Medications reviewed: Yes Allergies reviewed: Yes Dietary changes reviewed: Yes. Discussed low fat and low salt diet.  Referals reviewed: NA Activities of Daily Living: Able to with mild limitations Any transportation issues/concerns: None Any patient concerns: None Confirmed importance & date/time of Follow up appt: Yes Confirmed with patient if condition begins to worsen call. Pt was given the office number and encouraged to call back with questions or concerns: Yes

## 2020-07-12 NOTE — Telephone Encounter (Signed)
toc was done by me today

## 2020-07-12 NOTE — Progress Notes (Signed)
Patient referred by [name], NP for coronary artery disease  Subjective:   Robert Lucas, male    DOB: [birth year], 80 y.o.   MRN: [id number]   Chief Complaint  Patient presents with  . Coronary Artery Disease  . Hospitalization Follow-up  . Cardiomyopathy   HPI  80 y.o. Caucasian male with with hypertension, hyperlipidemia, IDDM, CAD s/p anterior MI in 1990s treated wiuth TPA and PTCA, CKD 4, obesity, h/o gout, OSA  Patient was hospitalized in January 2022 with dyspnea and cough. He was found to have complete acquired pneumonia, as well as reduced EF of 20% on echocardiogram.  Of note, his creatinine had significantly increased to up to 3.9 during his hospitalization.  Patient is here for transition of care visit. He has had significant improvement in his shortness of breath, orthopnea. He does not have any leg edema. Again, denies any chest pain. He has upcoming visit with his [place] nephrologist on 07/23/2020.    Initial consultation HPI 01/2020: Patient was previously followed by Dr. Wynonia Lawman for 29 years. He has not had any recurrent MI since his 46, when he has PTCA to his LAD. Subsequent echocardiograms and stress test, most recently in 2014, have all been reassuring. In April 2020, he started developing right hip pain for which he is seeing [facility].  He has been given treatment with prednisone, with minimal relief.  His walking is limited, due to hip pain.  At this point, there is no plan for surgery.  Prior to April 2020,p atient used to be more active, playing golf regularly, without chest pain, shortness of breath, palpitations, orthopnea, PND, TIA/syncope.  He has had stable leg edema for long time.  He does have CKD stage IV with a last known creatinine of 2.4, for which he sees [facility]rolog[facility].  He currently is on Lasix 20 mg once daily.   Current Outpatient Medications on File Prior to Visit  Medication Sig Dispense Refill  . acetaminophen (TYLENOL) 500 MG  tablet Take 1,000 mg by mouth every 6 (six) hours as needed for moderate pain or headache.     . allopurinol (ZYLOPRIM) 100 MG tablet Take 100 mg by mouth daily.    Marland Kitchen aspirin EC 81 MG tablet Take 81 mg by mouth daily.    Marland Kitchen atorvastatin (LIPITOR) 40 MG tablet Take 40 mg by mouth every evening.    . calcitRIOL (ROCALTROL) 0.25 MCG capsule Take 0.25 mcg by mouth every Monday, Wednesday, and Friday.    . cefdinir (OMNICEF) 300 MG capsule Take 1 capsule (300 mg total) by mouth daily. 1 capsule 0  . finasteride (PROSCAR) 5 MG tablet Take 5 mg by mouth daily.    . fluticasone (FLONASE) 50 MCG/ACT nasal spray Place 1 spray into both nostrils daily as needed for allergies or rhinitis.    . furosemide (LASIX) 40 MG tablet Take 40 mg by mouth daily.    . hydrALAZINE (APRESOLINE) 25 MG tablet Take 1 tablet (25 mg total) by mouth every 8 (eight) hours. 90 tablet 0  . insulin aspart protamine - aspart (NOVOLOG MIX 70/30 FLEXPEN) (70-30) 100 UNIT/ML FlexPen Inject 0-20 Units into the skin as directed. If BS is <180=0 units, If BS is 180-200 units=14 units, If BS is >201=20 units    . isosorbide mononitrate (IMDUR) 30 MG 24 hr tablet Take 0.5 tablets (15 mg total) by mouth daily. 30 tablet 0  . losartan (COZAAR) 25 MG tablet Take 1 tablet (25 mg total)  by mouth daily after supper. 30 tablet 0  . metoprolol succinate (TOPROL-XL) 50 MG 24 hr tablet Take 1 tablet (50 mg total) by mouth daily. Take with or immediately following a meal. 30 tablet 0  . tamsulosin (FLOMAX) 0.4 MG CAPS capsule Take 1 capsule (0.4 mg total) by mouth daily after supper. 30 capsule 0  . traMADol (ULTRAM) 50 MG tablet Take 50 mg by mouth at bedtime as needed.     No current facility-administered medications on file prior to visit.    Cardiovascular studies:  EKG 07/11/2020: Sinus rhythm 66 bpm Old anteroseptal infarct   Echocardiogram 07/10/2020: 1. Left ventricular ejection fraction, by estimation, is 20 to 25%. The  left ventricle has  severely decreased function. There is mild left  ventricular hypertrophy. Left ventricular diastolic parameters are  consistent with Grade I diastolic dysfunction  (impaired relaxation).  2. Right ventricular systolic function is normal. The right ventricular  size is normal.  3. Left atrial size was severely dilated.  4. The mitral valve is abnormal. Trivial mitral valve regurgitation.  5. The aortic valve is abnormal. Aortic valve regurgitation is trivial.  Mild to moderate aortic valve sclerosis/calcification is present, without  any evidence of aortic stenosis.  6. The inferior vena cava is dilated in size with >50% respiratory  variability, suggesting right atrial pressure of 8 mmHg.    Echocardiogram 02/05/2019: Left ventricle cavity is normal in size. Mild concentric hypertrophy of the left ventricle. Normal LV systolic function with EF 55%. Normal global wall motion. Doppler evidence of grade I (impaired) diastolic dysfunction, normal LAP.  Left atrial cavity is mildly dilated. Trace aortic valve stenosis. Mild (Grade I) mitral regurgitation. Inadequate TR jet to estimate pulmonary artery systolic pressure. Normal right atrial pressure.  No significant change compared to previous study in 2014.   Treadmill Cardiolite stress test 2014: Results not available  Cath 1990: Normal Left main, 99% stenosis proximal LAD, no significant disease CFX RCA, PTCA of LAD done   Recent labs: 07/11/2020: Glucose 118, BUN/Cr 50/3.98. EGFR 14. Na/K 136/4.2. Albumin 3.4. Rest of the CMP normal H/H 11/33. MCV 93. Platelets 275 HbA1C 6.3% BNP 398   08/15/2019: Glucose 151, BUN/Cr 40/2.3. EGFR 23.  HbA1C 6.9% Chol 135, TG 119, HDL 42, LDL 72 TSH 2.2 normal  05/07/2019: Glucose 314, BUN/Cr 42/2.55. EGFR 23. Na/K 129/4.4.  H/H 9.5/28.5. MCV 95. Platelets 156 HbA1C 6.7%   Review of Systems  Cardiovascular: Negative for chest pain, dyspnea on exertion, leg swelling, palpitations and  syncope.  Musculoskeletal: Positive for back pain (Radiating to left leg.  Also has left leg weakness.).        Vitals:   07/12/20 1455  BP: (!) 147/81  Pulse: 68  SpO2: 98%    Body mass index is 30.11 kg/m. Filed Weights   07/12/20 1455  Weight: 170 lb (77.1 kg)     Objective:   Physical Exam Vitals and nursing note reviewed.  Constitutional:      Appearance: He is well-developed.  Neck:     Vascular: No JVD.  Cardiovascular:     Rate and Rhythm: Normal rate and regular rhythm.     Pulses: Intact distal pulses.     Heart sounds: Normal heart sounds. No murmur heard.   Pulmonary:     Effort: Pulmonary effort is normal.     Breath sounds: Normal breath sounds. No wheezing or rales.  Musculoskeletal:     Right lower leg: Edema (Trace) present.  Left lower leg: Edema (Trace) present.           Assessment & Recommendations:   80 y.o. Caucasian male with with hypertension, hyperlipidemia, IDDM, CAD s/p anterior MI in 1990s treated wiuth TPA and PTCA, CKD 4, obesity, h/o gout, OSA, new diagnosis of HFrEF  HFrEF: New finding of EF 20% (Echocardiogram 07/2020).  Differential is wide, including ischemic cardiomyopathy. Occurrence in the setting of pneumonia, as well as AKI/CKD also means repeat echocardiogram after resolution of the two is warranted. At this time, continue metoprolol succinate 50 mg daily. Recommend Bidil 20-37.5 mg tid, instead of hydralazine/isosorbide mononitrate. If unable to afford, recommend hydralazine 25 mg tid and Isosorbide dinitrate 20 mg tid.  Unable to use ACEi/ARB/ARNI/AA due to AKI. Repeat echocardiogram in 4-6 weeks  Coronary artery disease involving native coronary artery of native heart without angina pectoris No angina symptoms. Continue Aspirin, statin, metoprolol  Hypertension: Fairly well controlled.  F/u in 6 weeks  Gershon Shorten Esther Hardy, MD [facility]. [place] Pager: [phone] Office: [phone] If no  answer Cell [phone]

## 2020-07-15 DIAGNOSIS — Z794 Long term (current) use of insulin: Secondary | ICD-10-CM | POA: Diagnosis not present

## 2020-07-15 DIAGNOSIS — I10 Essential (primary) hypertension: Secondary | ICD-10-CM | POA: Diagnosis not present

## 2020-07-15 DIAGNOSIS — I25110 Atherosclerotic heart disease of native coronary artery with unstable angina pectoris: Secondary | ICD-10-CM | POA: Diagnosis not present

## 2020-07-15 DIAGNOSIS — N184 Chronic kidney disease, stage 4 (severe): Secondary | ICD-10-CM | POA: Diagnosis not present

## 2020-07-15 DIAGNOSIS — E1122 Type 2 diabetes mellitus with diabetic chronic kidney disease: Secondary | ICD-10-CM | POA: Diagnosis not present

## 2020-08-23 ENCOUNTER — Ambulatory Visit: Payer: Medicare Other

## 2020-08-23 ENCOUNTER — Other Ambulatory Visit: Payer: Self-pay

## 2020-08-23 DIAGNOSIS — I5020 Unspecified systolic (congestive) heart failure: Secondary | ICD-10-CM

## 2020-08-25 DIAGNOSIS — N184 Chronic kidney disease, stage 4 (severe): Secondary | ICD-10-CM | POA: Diagnosis not present

## 2020-08-25 DIAGNOSIS — N183 Chronic kidney disease, stage 3 unspecified: Secondary | ICD-10-CM | POA: Diagnosis not present

## 2020-08-25 DIAGNOSIS — E782 Mixed hyperlipidemia: Secondary | ICD-10-CM | POA: Diagnosis not present

## 2020-08-25 DIAGNOSIS — Z0000 Encounter for general adult medical examination without abnormal findings: Secondary | ICD-10-CM | POA: Diagnosis not present

## 2020-08-25 DIAGNOSIS — R6889 Other general symptoms and signs: Secondary | ICD-10-CM | POA: Diagnosis not present

## 2020-08-29 DIAGNOSIS — I25110 Atherosclerotic heart disease of native coronary artery with unstable angina pectoris: Secondary | ICD-10-CM | POA: Diagnosis not present

## 2020-08-29 DIAGNOSIS — I10 Essential (primary) hypertension: Secondary | ICD-10-CM | POA: Diagnosis not present

## 2020-08-29 DIAGNOSIS — I25118 Atherosclerotic heart disease of native coronary artery with other forms of angina pectoris: Secondary | ICD-10-CM | POA: Diagnosis not present

## 2020-08-29 DIAGNOSIS — I252 Old myocardial infarction: Secondary | ICD-10-CM | POA: Diagnosis not present

## 2020-08-29 DIAGNOSIS — I119 Hypertensive heart disease without heart failure: Secondary | ICD-10-CM | POA: Diagnosis not present

## 2020-08-29 DIAGNOSIS — E782 Mixed hyperlipidemia: Secondary | ICD-10-CM | POA: Diagnosis not present

## 2020-08-29 DIAGNOSIS — N184 Chronic kidney disease, stage 4 (severe): Secondary | ICD-10-CM | POA: Diagnosis not present

## 2020-08-29 DIAGNOSIS — E1122 Type 2 diabetes mellitus with diabetic chronic kidney disease: Secondary | ICD-10-CM | POA: Diagnosis not present

## 2020-08-30 ENCOUNTER — Encounter: Payer: Self-pay | Admitting: Cardiology

## 2020-08-30 ENCOUNTER — Other Ambulatory Visit: Payer: Self-pay

## 2020-08-30 ENCOUNTER — Ambulatory Visit: Payer: Medicare Other | Admitting: Cardiology

## 2020-08-30 VITALS — BP 151/72 | HR 65 | Temp 98.0°F | Resp 16 | Ht 63.0 in | Wt 162.0 lb

## 2020-08-30 DIAGNOSIS — I10 Essential (primary) hypertension: Secondary | ICD-10-CM

## 2020-08-30 DIAGNOSIS — I2510 Atherosclerotic heart disease of native coronary artery without angina pectoris: Secondary | ICD-10-CM

## 2020-08-30 DIAGNOSIS — I5020 Unspecified systolic (congestive) heart failure: Secondary | ICD-10-CM

## 2020-08-30 NOTE — Progress Notes (Signed)
Patient referred by [name], NP for coronary artery disease  Subjective:   Robert Lucas, male    DOB: [birth year], 80 y.o.   MRN: [id number]   Chief Complaint  Patient presents with  . Cardiomyopathy  . HFrEF  . Follow-up   HPI  80 y.o. Caucasian male with with hypertension, hyperlipidemia, IDDM, CAD s/p anterior MI in 1990s treated with TPA and PTCA, CKD 4, obesity, h/o gout, OSA  Patient was hospitalized in January 2022 with dyspnea and cough. He was found to have complete acquired pneumonia, as well as reduced EF of 20% on echocardiogram.  Of note, his creatinine had significantly increased to up to 3.9 during his hospitalization.  Patient is here for f/u visit. He denies chest pain, shortness of breath, palpitations, leg edema, orthopnea, PND, TIA/syncope.  He has not been doing much of the physical activity since his hospitalization in January 2022.  Reviewed recent labs with the patient.  Creatinine significantly improved to 2.0.  Patient has an appointment with his nephrologist Dr. Joelyn Oms tomorrow.  Reviewed recent echocardiogram.  EF is improved from less than 20% to 30-35%.  Initial consultation HPI 01/2020: Patient was previously followed by Dr. Wynonia Lawman for 29 years. He has not had any recurrent MI since his 13, when he has PTCA to his LAD. Subsequent echocardiograms and stress test, most recently in 2014, have all been reassuring. In April 2020, he started developing right hip pain for which he is seeing [facility].  He has been given treatment with prednisone, with minimal relief.  His walking is limited, due to hip pain.  At this point, there is no plan for surgery.  Prior to April 2020,p atient used to be more active, playing golf regularly, without chest pain, shortness of breath, palpitations, orthopnea, PND, TIA/syncope.  He has had stable leg edema for long time.  He does have CKD stage IV with a last known creatinine of 2.4, for which he sees nephrologist  at [facility].  He currently is on Lasix 20 mg once daily.   Current Outpatient Medications on File Prior to Visit  Medication Sig Dispense Refill  . acetaminophen (TYLENOL) 500 MG tablet Take 1,000 mg by mouth every 6 (six) hours as needed for moderate pain or headache.     . allopurinol (ZYLOPRIM) 100 MG tablet Take 100 mg by mouth daily.    Marland Kitchen aspirin EC 81 MG tablet Take 81 mg by mouth daily.    Marland Kitchen atorvastatin (LIPITOR) 40 MG tablet Take 40 mg by mouth every evening.    . calcitRIOL (ROCALTROL) 0.25 MCG capsule Take 0.25 mcg by mouth every Monday, Wednesday, and Friday.    . finasteride (PROSCAR) 5 MG tablet Take 5 mg by mouth daily.    . fluticasone (FLONASE) 50 MCG/ACT nasal spray Place 1 spray into both nostrils daily as needed for allergies or rhinitis.    . furosemide (LASIX) 40 MG tablet Take 1 tablet (40 mg total) by mouth daily as needed for edema. 30 tablet 2  . insulin aspart protamine - aspart (NOVOLOG MIX 70/30 FLEXPEN) (70-30) 100 UNIT/ML FlexPen Inject 0-20 Units into the skin as directed. If BS is <180=0 units, If BS is 180-200 units=14 units, If BS is >201=20 units    . isosorbide-hydrALAZINE (BIDIL) 20-37.5 MG tablet Take 1 tablet by mouth 3 (three) times daily. 90 tablet 3  . losartan (COZAAR) 25 MG tablet Take 1 tablet (25 mg total) by mouth daily after supper. 30 tablet 0  .  metoprolol succinate (TOPROL-XL) 50 MG 24 hr tablet Take 1 tablet (50 mg total) by mouth daily. Take with or immediately following a meal. 30 tablet 0  . tamsulosin (FLOMAX) 0.4 MG CAPS capsule Take 1 capsule (0.4 mg total) by mouth daily after supper. 30 capsule 0  . traMADol (ULTRAM) 50 MG tablet Take 50 mg by mouth at bedtime as needed.     No current facility-administered medications on file prior to visit.    Cardiovascular studies:  Echocardiogram 08/23/2020:  Left ventricle cavity is normal in size and wall thickness. Apical  akinesis and moderate global hypokinesis. LVEF 30-35%, which is  marginally  better compare to previous study on 07/10/2020. Doppler evidence of grade II  (pseudonormal) diastolic dysfunction, elevated LAP.  Left atrial cavity is mildly dilated.  Structurally normal trileaflet aortic valve with no regurgitation. Trace  aortic valve stenosis.  Mild (Grade I) mitral regurgitation.  No evidence of pulmonary hypertension.  EKG 07/11/2020: Sinus rhythm 66 bpm Old anteroseptal infarct   Treadmill Cardiolite stress test 2014: Results not available  Cath 1990: Normal Left main, 99% stenosis proximal LAD, no significant disease CFX RCA, PTCA of LAD done   Recent labs: 08/05/2020: Glucose 258, BUN/Cr 71/2.0. EGFR 23.  HbA1C 6.3% Chol 95, TG 158, HDL 26, LDL 42 TSH 2.0 normal  07/11/2020: Glucose 118, BUN/Cr 50/3.98. EGFR 14. Na/K 136/4.2. Albumin 3.4. Rest of the CMP normal H/H 11/33. MCV 93. Platelets 275 HbA1C 6.3% BNP 398   08/15/2019: Glucose 151, BUN/Cr 40/2.3. EGFR 23.  HbA1C 6.9% Chol 135, TG 119, HDL 42, LDL 72 TSH 2.2 normal  05/07/2019: Glucose 314, BUN/Cr 42/2.55. EGFR 23. Na/K 129/4.4.  H/H 9.5/28.5. MCV 95. Platelets 156 HbA1C 6.7%   Review of Systems  Cardiovascular: Negative for chest pain, dyspnea on exertion, leg swelling, palpitations and syncope.  Musculoskeletal: Positive for back pain (Radiating to left leg.  Also has left leg weakness.).        Vitals:   08/30/20 1109  BP: (!) 151/72  Pulse: 65  Resp: 16  Temp: 98 F (36.7 C)  SpO2: 98%     Body mass index is 28.7 kg/m. Filed Weights   08/30/20 1109  Weight: 162 lb (73.5 kg)     Objective:   Physical Exam Vitals and nursing note reviewed.  Constitutional:      Appearance: He is well-developed.  Neck:     Vascular: No JVD.  Cardiovascular:     Rate and Rhythm: Normal rate and regular rhythm.     Pulses: Intact distal pulses.     Heart sounds: Normal heart sounds. No murmur heard.   Pulmonary:     Effort: Pulmonary effort is normal.     Breath  sounds: Normal breath sounds. No wheezing or rales.  Musculoskeletal:     Right lower leg: Edema (Trace) present.     Left lower leg: Edema (Trace) present.           Assessment & Recommendations:   80 y.o. Caucasian male with with hypertension, hyperlipidemia, IDDM, CAD s/p anterior MI in 1990s treated wiuth TPA and PTCA, CKD 4, obesity, h/o gout, OSA, new diagnosis of HFrEF  HFrEF: New finding of EF 20% (Echocardiogram 07/2020), marginally improved to 30-35% (echocardiogram 08/2020).  Differential is wide, including ischemic cardiomyopathy. Occurrence in the setting of pneumonia, as well as AKI/CKD also means repeat echocardiogram after resolution of the two is warranted. He does not have any angina symptoms at this time.  Given that  he is has marginally improved, I will hold off invasive work-up at this time, in the setting of his CKD. I will request his nephrologist Dr. Joelyn Oms, if he could switch him from losartan 25 mg to to [facility], with goal to uptitrate to up to 49-51 mg twice daily. Continue metoprolol succinate 50 mg daily. Continue Bidil 20-37.5 mg tid Continue Isosorbide dinitrate 20 mg tid.  Unable to use ACEi/ARB/ARNI/AA due to AKI.  Coronary artery disease involving native coronary artery of native heart without angina pectoris No angina symptoms. Continue Aspirin, statin, metoprolol  Hypertension: Fairly well controlled.  BMP in 1 week F/u in 2 weeks  Manish Esther Hardy, MD [facility]. [place] Pager: [phone] Office: [phone] If no answer Cell [phone]

## 2020-08-31 ENCOUNTER — Telehealth: Payer: Self-pay

## 2020-08-31 DIAGNOSIS — E1122 Type 2 diabetes mellitus with diabetic chronic kidney disease: Secondary | ICD-10-CM | POA: Diagnosis not present

## 2020-08-31 DIAGNOSIS — I429 Cardiomyopathy, unspecified: Secondary | ICD-10-CM | POA: Diagnosis not present

## 2020-08-31 DIAGNOSIS — I2510 Atherosclerotic heart disease of native coronary artery without angina pectoris: Secondary | ICD-10-CM | POA: Diagnosis not present

## 2020-08-31 DIAGNOSIS — N184 Chronic kidney disease, stage 4 (severe): Secondary | ICD-10-CM | POA: Diagnosis not present

## 2020-08-31 NOTE — Telephone Encounter (Signed)
Patient called that his kidney doctor advised him to not get on Entresto and he just wanted to make you aware.

## 2020-09-01 NOTE — Telephone Encounter (Signed)
Please see message attached thanks

## 2020-09-01 NOTE — Telephone Encounter (Signed)
Noted. In that case, I recommend staying on losartan, does not need BMP and OV currently scheduled in next 1-2 weeks. Instead, will proceed with lexiscan nuclear stress test to evaluate for any heart circulation abnormalities.   Staff, please arrange for lexi nuc and OV after that  Thanks MJP

## 2020-09-01 NOTE — Addendum Note (Signed)
Addended by: Nigel Mormon on: 09/01/2020 08:22 AM   Modules accepted: Orders

## 2020-09-06 ENCOUNTER — Other Ambulatory Visit: Payer: Self-pay

## 2020-09-06 ENCOUNTER — Ambulatory Visit: Payer: Medicare Other

## 2020-09-06 DIAGNOSIS — I2510 Atherosclerotic heart disease of native coronary artery without angina pectoris: Secondary | ICD-10-CM

## 2020-09-06 DIAGNOSIS — I5020 Unspecified systolic (congestive) heart failure: Secondary | ICD-10-CM

## 2020-09-09 NOTE — Progress Notes (Signed)
Discussed test results with the patient, which is likely to present ischemic cardiomyopathy.  Patient has no angina symptoms at this time.  His exertional dyspnea has also improved.  I explained to the patient that logical neck step would be to perform coronary angiogram to evaluate for obstructive CAD, followed by appropriate intervention PCI or CABG.  However, in absence of symptoms and presence of tenuous renal function, patient requests to pursue conservative management.  I would recommend repeat echocardiogram in May 2022, followed by office visit.  Staff, please arrange echocardiogram in May, then office visit.

## 2020-09-10 NOTE — Progress Notes (Signed)
Why am I getting this message?

## 2020-09-26 DIAGNOSIS — I119 Hypertensive heart disease without heart failure: Secondary | ICD-10-CM | POA: Diagnosis not present

## 2020-09-26 DIAGNOSIS — I25110 Atherosclerotic heart disease of native coronary artery with unstable angina pectoris: Secondary | ICD-10-CM | POA: Diagnosis not present

## 2020-09-26 DIAGNOSIS — I25118 Atherosclerotic heart disease of native coronary artery with other forms of angina pectoris: Secondary | ICD-10-CM | POA: Diagnosis not present

## 2020-09-26 DIAGNOSIS — E782 Mixed hyperlipidemia: Secondary | ICD-10-CM | POA: Diagnosis not present

## 2020-09-26 DIAGNOSIS — E1122 Type 2 diabetes mellitus with diabetic chronic kidney disease: Secondary | ICD-10-CM | POA: Diagnosis not present

## 2020-09-26 DIAGNOSIS — I252 Old myocardial infarction: Secondary | ICD-10-CM | POA: Diagnosis not present

## 2020-09-26 DIAGNOSIS — I10 Essential (primary) hypertension: Secondary | ICD-10-CM | POA: Diagnosis not present

## 2020-09-26 DIAGNOSIS — N184 Chronic kidney disease, stage 4 (severe): Secondary | ICD-10-CM | POA: Diagnosis not present

## 2020-10-01 ENCOUNTER — Other Ambulatory Visit: Payer: Self-pay | Admitting: Cardiology

## 2020-10-01 DIAGNOSIS — I5020 Unspecified systolic (congestive) heart failure: Secondary | ICD-10-CM

## 2020-10-06 DIAGNOSIS — E113291 Type 2 diabetes mellitus with mild nonproliferative diabetic retinopathy without macular edema, right eye: Secondary | ICD-10-CM | POA: Diagnosis not present

## 2020-10-06 DIAGNOSIS — H40033 Anatomical narrow angle, bilateral: Secondary | ICD-10-CM | POA: Diagnosis not present

## 2020-10-06 DIAGNOSIS — H2513 Age-related nuclear cataract, bilateral: Secondary | ICD-10-CM | POA: Diagnosis not present

## 2020-10-25 DIAGNOSIS — H4922 Sixth [abducent] nerve palsy, left eye: Secondary | ICD-10-CM | POA: Diagnosis not present

## 2020-10-29 DIAGNOSIS — I10 Essential (primary) hypertension: Secondary | ICD-10-CM | POA: Diagnosis not present

## 2020-10-29 DIAGNOSIS — N184 Chronic kidney disease, stage 4 (severe): Secondary | ICD-10-CM | POA: Diagnosis not present

## 2020-10-29 DIAGNOSIS — I25110 Atherosclerotic heart disease of native coronary artery with unstable angina pectoris: Secondary | ICD-10-CM | POA: Diagnosis not present

## 2020-10-29 DIAGNOSIS — I252 Old myocardial infarction: Secondary | ICD-10-CM | POA: Diagnosis not present

## 2020-10-29 DIAGNOSIS — I119 Hypertensive heart disease without heart failure: Secondary | ICD-10-CM | POA: Diagnosis not present

## 2020-10-29 DIAGNOSIS — I25118 Atherosclerotic heart disease of native coronary artery with other forms of angina pectoris: Secondary | ICD-10-CM | POA: Diagnosis not present

## 2020-10-29 DIAGNOSIS — E782 Mixed hyperlipidemia: Secondary | ICD-10-CM | POA: Diagnosis not present

## 2020-10-29 DIAGNOSIS — E1122 Type 2 diabetes mellitus with diabetic chronic kidney disease: Secondary | ICD-10-CM | POA: Diagnosis not present

## 2020-11-01 DIAGNOSIS — N184 Chronic kidney disease, stage 4 (severe): Secondary | ICD-10-CM | POA: Diagnosis not present

## 2020-11-08 DIAGNOSIS — I5020 Unspecified systolic (congestive) heart failure: Secondary | ICD-10-CM | POA: Diagnosis not present

## 2020-11-08 DIAGNOSIS — I429 Cardiomyopathy, unspecified: Secondary | ICD-10-CM | POA: Diagnosis not present

## 2020-11-08 DIAGNOSIS — I2510 Atherosclerotic heart disease of native coronary artery without angina pectoris: Secondary | ICD-10-CM | POA: Diagnosis not present

## 2020-11-08 DIAGNOSIS — E1122 Type 2 diabetes mellitus with diabetic chronic kidney disease: Secondary | ICD-10-CM | POA: Diagnosis not present

## 2020-11-08 DIAGNOSIS — N184 Chronic kidney disease, stage 4 (severe): Secondary | ICD-10-CM | POA: Diagnosis not present

## 2020-11-08 DIAGNOSIS — E872 Acidosis: Secondary | ICD-10-CM | POA: Diagnosis not present

## 2020-11-08 DIAGNOSIS — I129 Hypertensive chronic kidney disease with stage 1 through stage 4 chronic kidney disease, or unspecified chronic kidney disease: Secondary | ICD-10-CM | POA: Diagnosis not present

## 2020-11-09 ENCOUNTER — Other Ambulatory Visit: Payer: Self-pay

## 2020-11-09 ENCOUNTER — Telehealth: Payer: Self-pay

## 2020-11-09 DIAGNOSIS — H4922 Sixth [abducent] nerve palsy, left eye: Secondary | ICD-10-CM | POA: Diagnosis not present

## 2020-11-09 MED ORDER — ISOSORBIDE DINITRATE 20 MG PO TABS: 20.0000 mg | ORAL_TABLET | Freq: Three times a day (TID) | ORAL | 3 refills | Status: DC

## 2020-11-09 MED ORDER — HYDRALAZINE HCL 50 MG PO TABS: 50.0000 mg | ORAL_TABLET | Freq: Three times a day (TID) | ORAL | 3 refills | Status: DC

## 2020-11-09 NOTE — Telephone Encounter (Signed)
Called pt to inform him about message above new rx has been send

## 2020-11-09 NOTE — Telephone Encounter (Signed)
Patient called that he has been on bidil but he ran out 3 days ago patient stated medication works but it is expensive patient wanted to see if you could give him something cheaper or split the medication his kidney doctor told him splitting the rx could be cheaper please advise

## 2020-11-09 NOTE — Telephone Encounter (Signed)
Yes.  Please stop Bidil 20-37.5 mg tid Send the following, instead. Isordil 20 mg tid 90 pillsX3 refills Hydralzine 50 mg tid 90 pillsX3 refills  Thanks MJP

## 2020-11-15 ENCOUNTER — Other Ambulatory Visit: Payer: Self-pay

## 2020-11-15 ENCOUNTER — Ambulatory Visit: Payer: Medicare Other

## 2020-11-15 DIAGNOSIS — I5020 Unspecified systolic (congestive) heart failure: Secondary | ICD-10-CM

## 2020-11-30 DIAGNOSIS — I5020 Unspecified systolic (congestive) heart failure: Secondary | ICD-10-CM | POA: Diagnosis not present

## 2020-11-30 DIAGNOSIS — I10 Essential (primary) hypertension: Secondary | ICD-10-CM | POA: Diagnosis not present

## 2020-11-30 DIAGNOSIS — E1122 Type 2 diabetes mellitus with diabetic chronic kidney disease: Secondary | ICD-10-CM | POA: Diagnosis not present

## 2020-11-30 DIAGNOSIS — I25110 Atherosclerotic heart disease of native coronary artery with unstable angina pectoris: Secondary | ICD-10-CM | POA: Diagnosis not present

## 2020-11-30 DIAGNOSIS — E782 Mixed hyperlipidemia: Secondary | ICD-10-CM | POA: Diagnosis not present

## 2020-11-30 DIAGNOSIS — I25118 Atherosclerotic heart disease of native coronary artery with other forms of angina pectoris: Secondary | ICD-10-CM | POA: Diagnosis not present

## 2020-11-30 DIAGNOSIS — I119 Hypertensive heart disease without heart failure: Secondary | ICD-10-CM | POA: Diagnosis not present

## 2020-11-30 DIAGNOSIS — N184 Chronic kidney disease, stage 4 (severe): Secondary | ICD-10-CM | POA: Diagnosis not present

## 2020-11-30 NOTE — Progress Notes (Signed)
Patient referred by [name], NP for coronary artery disease  Subjective:   Robert Lucas, male    DOB: [birth year], 80 y.o.   MRN: [id number]   Chief Complaint  Patient presents with  . Coronary Artery Disease  . Heart Failure  . Results  . Follow-up   HPI  80 y.o. Caucasian male with with hypertension, hyperlipidemia, IDDM, CAD s/p anterior MI in 45s treated with TPA and PTCA, CKD 4, obesity, h/o gout, OSA, HFrEF  Patient is doing well. He denies chest pain, shortness of breath, palpitations, leg edema, orthopnea, PND, TIA/syncope. Reviewed recent echocardiogram results with the patient, details below. Blood pressure is elevated today, but usually better controlled at home.     Current Outpatient Medications on File Prior to Visit  Medication Sig Dispense Refill  . acetaminophen (TYLENOL) 500 MG tablet Take 1,000 mg by mouth every 6 (six) hours as needed for moderate pain or headache.     . allopurinol (ZYLOPRIM) 100 MG tablet Take 100 mg by mouth daily.    Marland Kitchen aspirin EC 81 MG tablet Take 81 mg by mouth daily.    Marland Kitchen atorvastatin (LIPITOR) 40 MG tablet Take 40 mg by mouth every evening.    . calcitRIOL (ROCALTROL) 0.25 MCG capsule Take 0.25 mcg by mouth every Monday, Wednesday, and Friday.    . finasteride (PROSCAR) 5 MG tablet Take 5 mg by mouth daily.    . furosemide (LASIX) 40 MG tablet TAKE 1 TABLET BY MOUTH  DAILY AS NEEDED FOR EDEMA 90 tablet 3  . hydrALAZINE (APRESOLINE) 50 MG tablet Take 1 tablet (50 mg total) by mouth 3 (three) times daily. 90 tablet 3  . insulin aspart protamine - aspart (NOVOLOG MIX 70/30 FLEXPEN) (70-30) 100 UNIT/ML FlexPen Inject 0-20 Units into the skin as directed. If BS is <180=0 units, If BS is 180-200 units=14 units, If BS is >201=20 units    . isosorbide dinitrate (ISORDIL) 20 MG tablet Take 1 tablet (20 mg total) by mouth 3 (three) times daily. 90 tablet 3  . losartan (COZAAR) 25 MG tablet Take 1 tablet (25 mg total) by mouth daily  after supper. 30 tablet 0  . metoprolol succinate (TOPROL-XL) 50 MG 24 hr tablet Take 1 tablet (50 mg total) by mouth daily. Take with or immediately following a meal. 30 tablet 0  . tamsulosin (FLOMAX) 0.4 MG CAPS capsule Take 1 capsule (0.4 mg total) by mouth daily after supper. (Patient not taking: Reported on 12/01/2020) 30 capsule 0   No current facility-administered medications on file prior to visit.    Cardiovascular studies:  Echocardiogram 11/15/2020:  Left ventricle cavity is normal in size and wall thickness. Mild global  hypokinesis, apical akinesis. LVEF 45-50%. Doppler evidence of grade II  (pseudonormal) diastolic dysfunction, elevated LAP. Left atrial cavity is  mild to moderately dilated.  Mild (Grade I) mitral regurgitation.  Mild tricuspid regurgitation. Estimated pulmonary artery systolic pressure  41 mmHg.  Compared to previous study on 08/23/2020, LVEF has improved from 30-35%.   Mild PH is new.   EKG 07/11/2020: Sinus rhythm 66 bpm Old anteroseptal infarct   Treadmill Cardiolite stress test 2014: Results not available  Cath 1990: Normal Left main, 99% stenosis proximal LAD, no significant disease CFX RCA, PTCA of LAD done   Recent labs: 08/05/2020: Glucose 258, BUN/Cr 71/2.0. EGFR 23.  HbA1C 6.3% Chol 95, TG 158, HDL 26, LDL 42 TSH 2.0 normal  07/11/2020: Glucose 118, BUN/Cr 50/3.98. EGFR 14. Na/K  136/4.2. Albumin 3.4. Rest of the CMP normal H/H 11/33. MCV 93. Platelets 275 HbA1C 6.3% BNP 398   08/15/2019: Glucose 151, BUN/Cr 40/2.3. EGFR 23.  HbA1C 6.9% Chol 135, TG 119, HDL 42, LDL 72 TSH 2.2 normal  05/07/2019: Glucose 314, BUN/Cr 42/2.55. EGFR 23. Na/K 129/4.4.  H/H 9.5/28.5. MCV 95. Platelets 156 HbA1C 6.7%   Review of Systems  Cardiovascular: Negative for chest pain, dyspnea on exertion, leg swelling, palpitations and syncope.  Musculoskeletal: Positive for back pain (Radiating to left leg.  Also has left leg weakness.).         Vitals:   12/01/20 1125 12/01/20 1131  BP: (!) 161/78 (!) 157/81  Pulse: (!) 59 60  Temp: 98.2 F (36.8 C)   SpO2: 99% 99%     Body mass index is 28.52 kg/m. Filed Weights   12/01/20 1125  Weight: 161 lb (73 kg)     Objective:   Physical Exam Vitals and nursing note reviewed.  Constitutional:      Appearance: He is well-developed.  Neck:     Vascular: No JVD.  Cardiovascular:     Rate and Rhythm: Normal rate and regular rhythm.     Pulses: Intact distal pulses.     Heart sounds: Normal heart sounds. No murmur heard.   Pulmonary:     Effort: Pulmonary effort is normal.     Breath sounds: Normal breath sounds. No wheezing or rales.  Musculoskeletal:     Right lower leg: No edema.     Left lower leg: No edema.           Assessment & Recommendations:   80 y.o. Caucasian male with with hypertension, hyperlipidemia, IDDM, CAD s/p anterior MI in 1990s treated with TPA and PTCA, CKD 4, obesity, h/o gout, OSA, HFrEF  HFrEF: EF improved to 45-50% (echcoardiogram 11/15/2020) Evolumic on exam.  Coronary artery disease involving native coronary artery of native heart without angina pectoris No angina symptoms. Continue Aspirin, statin, metoprolol  Hypertension: BP elevated today, usually better controlled at home. Recommend continued home monitoring. He has f/u w/nephrology next month. Is it remains uncontrolled. Consider doubling the dose of isordil and hydralazine, or adding amlodipine.  F/u in 3 months  [place], MD [facility] Pager: [phone] Office: [phone] If no answer Cell [phone]

## 2020-12-01 ENCOUNTER — Other Ambulatory Visit: Payer: Self-pay

## 2020-12-01 ENCOUNTER — Encounter: Payer: Self-pay | Admitting: Cardiology

## 2020-12-01 ENCOUNTER — Ambulatory Visit: Payer: Medicare Other | Admitting: Cardiology

## 2020-12-01 VITALS — BP 157/81 | HR 60 | Temp 98.2°F | Ht 63.0 in | Wt 161.0 lb

## 2020-12-01 DIAGNOSIS — I5020 Unspecified systolic (congestive) heart failure: Secondary | ICD-10-CM | POA: Diagnosis not present

## 2020-12-01 DIAGNOSIS — H4922 Sixth [abducent] nerve palsy, left eye: Secondary | ICD-10-CM | POA: Diagnosis not present

## 2020-12-01 DIAGNOSIS — I2510 Atherosclerotic heart disease of native coronary artery without angina pectoris: Secondary | ICD-10-CM | POA: Diagnosis not present

## 2020-12-01 DIAGNOSIS — I10 Essential (primary) hypertension: Secondary | ICD-10-CM

## 2020-12-14 DIAGNOSIS — I252 Old myocardial infarction: Secondary | ICD-10-CM | POA: Diagnosis not present

## 2020-12-14 DIAGNOSIS — N184 Chronic kidney disease, stage 4 (severe): Secondary | ICD-10-CM | POA: Diagnosis not present

## 2020-12-14 DIAGNOSIS — E782 Mixed hyperlipidemia: Secondary | ICD-10-CM | POA: Diagnosis not present

## 2020-12-14 DIAGNOSIS — I119 Hypertensive heart disease without heart failure: Secondary | ICD-10-CM | POA: Diagnosis not present

## 2020-12-14 DIAGNOSIS — I25110 Atherosclerotic heart disease of native coronary artery with unstable angina pectoris: Secondary | ICD-10-CM | POA: Diagnosis not present

## 2020-12-14 DIAGNOSIS — I25118 Atherosclerotic heart disease of native coronary artery with other forms of angina pectoris: Secondary | ICD-10-CM | POA: Diagnosis not present

## 2020-12-14 DIAGNOSIS — I5020 Unspecified systolic (congestive) heart failure: Secondary | ICD-10-CM | POA: Diagnosis not present

## 2020-12-14 DIAGNOSIS — E1122 Type 2 diabetes mellitus with diabetic chronic kidney disease: Secondary | ICD-10-CM | POA: Diagnosis not present

## 2020-12-14 DIAGNOSIS — I10 Essential (primary) hypertension: Secondary | ICD-10-CM | POA: Diagnosis not present

## 2020-12-21 DIAGNOSIS — H2513 Age-related nuclear cataract, bilateral: Secondary | ICD-10-CM | POA: Diagnosis not present

## 2020-12-21 DIAGNOSIS — H25013 Cortical age-related cataract, bilateral: Secondary | ICD-10-CM | POA: Diagnosis not present

## 2020-12-21 DIAGNOSIS — H25043 Posterior subcapsular polar age-related cataract, bilateral: Secondary | ICD-10-CM | POA: Diagnosis not present

## 2020-12-21 DIAGNOSIS — H18413 Arcus senilis, bilateral: Secondary | ICD-10-CM | POA: Diagnosis not present

## 2021-01-04 DIAGNOSIS — N184 Chronic kidney disease, stage 4 (severe): Secondary | ICD-10-CM | POA: Diagnosis not present

## 2021-01-10 DIAGNOSIS — I429 Cardiomyopathy, unspecified: Secondary | ICD-10-CM | POA: Diagnosis not present

## 2021-01-10 DIAGNOSIS — N184 Chronic kidney disease, stage 4 (severe): Secondary | ICD-10-CM | POA: Diagnosis not present

## 2021-01-10 DIAGNOSIS — I2510 Atherosclerotic heart disease of native coronary artery without angina pectoris: Secondary | ICD-10-CM | POA: Diagnosis not present

## 2021-01-10 DIAGNOSIS — I5020 Unspecified systolic (congestive) heart failure: Secondary | ICD-10-CM | POA: Diagnosis not present

## 2021-01-10 DIAGNOSIS — E1122 Type 2 diabetes mellitus with diabetic chronic kidney disease: Secondary | ICD-10-CM | POA: Diagnosis not present

## 2021-01-10 DIAGNOSIS — I129 Hypertensive chronic kidney disease with stage 1 through stage 4 chronic kidney disease, or unspecified chronic kidney disease: Secondary | ICD-10-CM | POA: Diagnosis not present

## 2021-01-10 DIAGNOSIS — E872 Acidosis: Secondary | ICD-10-CM | POA: Diagnosis not present

## 2021-01-10 DIAGNOSIS — D631 Anemia in chronic kidney disease: Secondary | ICD-10-CM | POA: Diagnosis not present

## 2021-01-18 DIAGNOSIS — E1122 Type 2 diabetes mellitus with diabetic chronic kidney disease: Secondary | ICD-10-CM | POA: Diagnosis not present

## 2021-01-18 DIAGNOSIS — I10 Essential (primary) hypertension: Secondary | ICD-10-CM | POA: Diagnosis not present

## 2021-01-18 DIAGNOSIS — I252 Old myocardial infarction: Secondary | ICD-10-CM | POA: Diagnosis not present

## 2021-01-18 DIAGNOSIS — N184 Chronic kidney disease, stage 4 (severe): Secondary | ICD-10-CM | POA: Diagnosis not present

## 2021-01-18 DIAGNOSIS — I25110 Atherosclerotic heart disease of native coronary artery with unstable angina pectoris: Secondary | ICD-10-CM | POA: Diagnosis not present

## 2021-01-18 DIAGNOSIS — I5020 Unspecified systolic (congestive) heart failure: Secondary | ICD-10-CM | POA: Diagnosis not present

## 2021-01-18 DIAGNOSIS — I119 Hypertensive heart disease without heart failure: Secondary | ICD-10-CM | POA: Diagnosis not present

## 2021-01-18 DIAGNOSIS — I25118 Atherosclerotic heart disease of native coronary artery with other forms of angina pectoris: Secondary | ICD-10-CM | POA: Diagnosis not present

## 2021-01-18 DIAGNOSIS — E782 Mixed hyperlipidemia: Secondary | ICD-10-CM | POA: Diagnosis not present

## 2021-01-19 ENCOUNTER — Ambulatory Visit: Payer: Medicare Other | Admitting: Cardiology

## 2021-01-31 HISTORY — PX: CATARACT EXTRACTION: SUR2

## 2021-02-14 DIAGNOSIS — H2513 Age-related nuclear cataract, bilateral: Secondary | ICD-10-CM | POA: Diagnosis not present

## 2021-02-14 DIAGNOSIS — H2511 Age-related nuclear cataract, right eye: Secondary | ICD-10-CM | POA: Diagnosis not present

## 2021-02-15 DIAGNOSIS — H2512 Age-related nuclear cataract, left eye: Secondary | ICD-10-CM | POA: Diagnosis not present

## 2021-02-17 DIAGNOSIS — N184 Chronic kidney disease, stage 4 (severe): Secondary | ICD-10-CM | POA: Diagnosis not present

## 2021-02-17 DIAGNOSIS — E782 Mixed hyperlipidemia: Secondary | ICD-10-CM | POA: Diagnosis not present

## 2021-02-17 DIAGNOSIS — I10 Essential (primary) hypertension: Secondary | ICD-10-CM | POA: Diagnosis not present

## 2021-02-17 DIAGNOSIS — I252 Old myocardial infarction: Secondary | ICD-10-CM | POA: Diagnosis not present

## 2021-02-17 DIAGNOSIS — E1122 Type 2 diabetes mellitus with diabetic chronic kidney disease: Secondary | ICD-10-CM | POA: Diagnosis not present

## 2021-02-17 DIAGNOSIS — I25118 Atherosclerotic heart disease of native coronary artery with other forms of angina pectoris: Secondary | ICD-10-CM | POA: Diagnosis not present

## 2021-02-17 DIAGNOSIS — I25110 Atherosclerotic heart disease of native coronary artery with unstable angina pectoris: Secondary | ICD-10-CM | POA: Diagnosis not present

## 2021-02-17 DIAGNOSIS — I5020 Unspecified systolic (congestive) heart failure: Secondary | ICD-10-CM | POA: Diagnosis not present

## 2021-02-17 DIAGNOSIS — I119 Hypertensive heart disease without heart failure: Secondary | ICD-10-CM | POA: Diagnosis not present

## 2021-03-03 HISTORY — PX: EYE SURGERY: SHX253

## 2021-03-08 DIAGNOSIS — N184 Chronic kidney disease, stage 4 (severe): Secondary | ICD-10-CM | POA: Diagnosis not present

## 2021-03-14 DIAGNOSIS — H2512 Age-related nuclear cataract, left eye: Secondary | ICD-10-CM | POA: Diagnosis not present

## 2021-03-14 DIAGNOSIS — H2513 Age-related nuclear cataract, bilateral: Secondary | ICD-10-CM | POA: Diagnosis not present

## 2021-03-15 DIAGNOSIS — I120 Hypertensive chronic kidney disease with stage 5 chronic kidney disease or end stage renal disease: Secondary | ICD-10-CM | POA: Diagnosis not present

## 2021-03-15 DIAGNOSIS — I5020 Unspecified systolic (congestive) heart failure: Secondary | ICD-10-CM | POA: Diagnosis not present

## 2021-03-15 DIAGNOSIS — E872 Acidosis: Secondary | ICD-10-CM | POA: Diagnosis not present

## 2021-03-15 DIAGNOSIS — N185 Chronic kidney disease, stage 5: Secondary | ICD-10-CM | POA: Diagnosis not present

## 2021-03-15 DIAGNOSIS — D631 Anemia in chronic kidney disease: Secondary | ICD-10-CM | POA: Diagnosis not present

## 2021-03-15 DIAGNOSIS — I429 Cardiomyopathy, unspecified: Secondary | ICD-10-CM | POA: Diagnosis not present

## 2021-03-15 DIAGNOSIS — E1122 Type 2 diabetes mellitus with diabetic chronic kidney disease: Secondary | ICD-10-CM | POA: Diagnosis not present

## 2021-03-15 DIAGNOSIS — I2510 Atherosclerotic heart disease of native coronary artery without angina pectoris: Secondary | ICD-10-CM | POA: Diagnosis not present

## 2021-03-17 ENCOUNTER — Ambulatory Visit: Payer: Medicare Other | Admitting: Cardiology

## 2021-03-19 ENCOUNTER — Other Ambulatory Visit: Payer: Self-pay | Admitting: Cardiology

## 2021-03-21 DIAGNOSIS — H2513 Age-related nuclear cataract, bilateral: Secondary | ICD-10-CM | POA: Diagnosis not present

## 2021-03-23 ENCOUNTER — Other Ambulatory Visit: Payer: Self-pay

## 2021-03-23 ENCOUNTER — Ambulatory Visit: Payer: Medicare Other | Admitting: Cardiology

## 2021-03-23 ENCOUNTER — Encounter: Payer: Self-pay | Admitting: Cardiology

## 2021-03-23 VITALS — BP 150/67 | HR 62 | Temp 98.0°F | Resp 16 | Ht 63.0 in | Wt 161.0 lb

## 2021-03-23 DIAGNOSIS — I5020 Unspecified systolic (congestive) heart failure: Secondary | ICD-10-CM

## 2021-03-23 DIAGNOSIS — I2510 Atherosclerotic heart disease of native coronary artery without angina pectoris: Secondary | ICD-10-CM

## 2021-03-23 DIAGNOSIS — I10 Essential (primary) hypertension: Secondary | ICD-10-CM | POA: Diagnosis not present

## 2021-03-23 MED ORDER — ISOSORBIDE DINITRATE 20 MG PO TABS: 20.0000 mg | ORAL_TABLET | Freq: Two times a day (BID) | ORAL | 3 refills | Status: DC

## 2021-03-23 MED ORDER — HYDRALAZINE HCL 50 MG PO TABS: 50.0000 mg | ORAL_TABLET | Freq: Two times a day (BID) | ORAL | 3 refills | Status: DC

## 2021-03-23 NOTE — Progress Notes (Signed)
Patient referred by [name], NP for coronary artery disease  Subjective:   ARSALAN BRISBIN, male    DOB: 1941/04/08, 80 y.o.   MRN: [id number]   Chief Complaint  Patient presents with   HFrEF (heart failure with reduced ejection fraction)   Coronary artery disease involving native coronary artery of   Follow-up    3 month   HPI  80 y.o. Caucasian male with with hypertension, hyperlipidemia, IDDM, CAD s/p anterior MI in 61s treated with TPA and PTCA, CKD 4, obesity, h/o gout, OSA, HFrEF  Patient is doing well.  His physical activity is limited by his back pain, but with his limited activity, denies any chest pain or shortness of breath symptoms.  Leg edema is mild and stable.  Blood pressure is much better controlled on home readings.  He has regular follow-up with his nephrologist every 2 months.  GFR is 14.  Nephrologist is discuss dialysis access with the patient.    Current Outpatient Medications on File Prior to Visit  Medication Sig Dispense Refill   acetaminophen (TYLENOL) 500 MG tablet Take 1,000 mg by mouth every 6 (six) hours as needed for moderate pain or headache.      allopurinol (ZYLOPRIM) 100 MG tablet Take 100 mg by mouth daily.     aspirin EC 81 MG tablet Take 81 mg by mouth daily.     atorvastatin (LIPITOR) 40 MG tablet Take 40 mg by mouth every evening.     calcitRIOL (ROCALTROL) 0.25 MCG capsule Take 0.25 mcg by mouth every Monday, Wednesday, and Friday.     finasteride (PROSCAR) 5 MG tablet Take 5 mg by mouth daily.     furosemide (LASIX) 40 MG tablet TAKE 1 TABLET BY MOUTH  DAILY AS NEEDED FOR EDEMA 90 tablet 3   hydrALAZINE (APRESOLINE) 50 MG tablet TAKE 1 TABLET(50 MG) BY MOUTH THREE TIMES DAILY 90 tablet 3   insulin aspart protamine - aspart (NOVOLOG MIX 70/30 FLEXPEN) (70-30) 100 UNIT/ML FlexPen Inject 0-20 Units into the skin as directed. If BS is <180=0 units, If BS is 180-200 units=14 units, If BS is >201=20 units     isosorbide dinitrate  (ISORDIL) 20 MG tablet Take 1 tablet (20 mg total) by mouth 3 (three) times daily. 90 tablet 3   losartan (COZAAR) 25 MG tablet Take 1 tablet (25 mg total) by mouth daily after supper. 30 tablet 0   metoprolol succinate (TOPROL-XL) 50 MG 24 hr tablet Take 1 tablet (50 mg total) by mouth daily. Take with or immediately following a meal. 30 tablet 0   tamsulosin (FLOMAX) 0.4 MG CAPS capsule Take 1 capsule (0.4 mg total) by mouth daily after supper. (Patient not taking: Reported on 12/01/2020) 30 capsule 0   No current facility-administered medications on file prior to visit.    Cardiovascular studies:  EKG 03/23/2021: Sinus rhythm 62 bpm Old anterior infarct.  Nonspecific T-abnormality  Echocardiogram 11/15/2020:  Left ventricle cavity is normal in size and wall thickness. Mild global  hypokinesis, apical akinesis. LVEF 45-50%.  Doppler evidence of grade II  (pseudonormal) diastolic dysfunction, elevated LAP. Left atrial cavity is  mild to moderately dilated.  Mild (Grade I) mitral regurgitation.  Mild tricuspid regurgitation. Estimated pulmonary artery systolic pressure  41 mmHg.  Compared to previous study on 08/23/2020, LVEF has improved from 30-35%.    Mild PH is new.   EKG 07/11/2020: Sinus rhythm 66 bpm Old anteroseptal infarct   Treadmill Cardiolite stress test 2014: Results  not available  Cath 1990: Normal Left main, 99% stenosis proximal LAD, no significant disease CFX RCA, PTCA of LAD done   Recent labs: 08/05/2020: Glucose 258, BUN/Cr 71/2.0. EGFR 23.  HbA1C 6.3% Chol 95, TG 158, HDL 26, LDL 42 TSH 2.0 normal  07/11/2020: Glucose 118, BUN/Cr 50/3.98. EGFR 14. Na/K 136/4.2. Albumin 3.4. Rest of the CMP normal H/H 11/33. MCV 93. Platelets 275 HbA1C 6.3% BNP 398   08/15/2019: Glucose 151, BUN/Cr 40/2.3. EGFR 23.  HbA1C 6.9% Chol 135, TG 119, HDL 42, LDL 72 TSH 2.2 normal  05/07/2019: Glucose 314, BUN/Cr 42/2.55. EGFR 23. Na/K 129/4.4.  H/H 9.5/28.5. MCV 95.  Platelets 156 HbA1C 6.7%   Review of Systems  Cardiovascular:  Positive for leg swelling (Mild, stable). Negative for chest pain, dyspnea on exertion, palpitations and syncope.  Musculoskeletal:  Positive for back pain (Radiating to left leg.  Also has left leg weakness.).       Vitals:   03/23/21 1404  BP: (!) 150/67  Pulse: 62  Resp: 16  Temp: 98 F (36.7 C)  SpO2: 98%     Body mass index is 28.52 kg/m. Filed Weights   03/23/21 1404  Weight: 161 lb (73 kg)     Objective:   Physical Exam Vitals and nursing note reviewed.  Constitutional:      Appearance: He is well-developed.  Neck:     Vascular: No JVD.  Cardiovascular:     Rate and Rhythm: Normal rate and regular rhythm.     Pulses: Intact distal pulses.     Heart sounds: Normal heart sounds. No murmur heard. Pulmonary:     Effort: Pulmonary effort is normal.     Breath sounds: Normal breath sounds. No wheezing or rales.  Musculoskeletal:     Right lower leg: No edema.     Left lower leg: No edema.          Assessment & Recommendations:   80 y.o. Caucasian male with with hypertension, hyperlipidemia, IDDM, CAD s/p anterior MI in 1990s treated with TPA and PTCA, CKD 4, obesity, h/o gout, OSA, HFrEF  HFrEF: EF improved to 45-50% (echcoardiogram 11/15/2020) Evolumic on exam.  Coronary artery disease involving native coronary artery of native heart without angina pectoris No angina symptoms. Continue Aspirin, statin, metoprolol  Hypertension: Suspect component of whitecoat hypertension. Continue with medical therapy, other than the fact that he would like to take Isordil and hydralazine twice daily.  Okay with me.  Refill prescription sent.  F/u in 6 months  Caiya Bettes Esther [place], MD [facility] [place] Pager: [phone] Office: [phone] If no answer Cell [phone]

## 2021-03-24 DIAGNOSIS — I25118 Atherosclerotic heart disease of native coronary artery with other forms of angina pectoris: Secondary | ICD-10-CM | POA: Diagnosis not present

## 2021-03-24 DIAGNOSIS — I5020 Unspecified systolic (congestive) heart failure: Secondary | ICD-10-CM | POA: Diagnosis not present

## 2021-03-24 DIAGNOSIS — I252 Old myocardial infarction: Secondary | ICD-10-CM | POA: Diagnosis not present

## 2021-03-24 DIAGNOSIS — E1122 Type 2 diabetes mellitus with diabetic chronic kidney disease: Secondary | ICD-10-CM | POA: Diagnosis not present

## 2021-03-24 DIAGNOSIS — I119 Hypertensive heart disease without heart failure: Secondary | ICD-10-CM | POA: Diagnosis not present

## 2021-03-24 DIAGNOSIS — E782 Mixed hyperlipidemia: Secondary | ICD-10-CM | POA: Diagnosis not present

## 2021-03-24 DIAGNOSIS — N184 Chronic kidney disease, stage 4 (severe): Secondary | ICD-10-CM | POA: Diagnosis not present

## 2021-03-24 DIAGNOSIS — I25110 Atherosclerotic heart disease of native coronary artery with unstable angina pectoris: Secondary | ICD-10-CM | POA: Diagnosis not present

## 2021-03-24 DIAGNOSIS — I10 Essential (primary) hypertension: Secondary | ICD-10-CM | POA: Diagnosis not present

## 2021-03-25 DIAGNOSIS — N189 Chronic kidney disease, unspecified: Secondary | ICD-10-CM | POA: Diagnosis not present

## 2021-03-31 ENCOUNTER — Other Ambulatory Visit: Payer: Self-pay

## 2021-03-31 DIAGNOSIS — N179 Acute kidney failure, unspecified: Secondary | ICD-10-CM

## 2021-04-08 ENCOUNTER — Ambulatory Visit (INDEPENDENT_AMBULATORY_CARE_PROVIDER_SITE_OTHER): Payer: Medicare Other | Admitting: Vascular Surgery

## 2021-04-08 ENCOUNTER — Other Ambulatory Visit: Payer: Self-pay

## 2021-04-08 ENCOUNTER — Ambulatory Visit (HOSPITAL_COMMUNITY)
Admission: RE | Admit: 2021-04-08 | Discharge: 2021-04-08 | Disposition: A | Discharge status: Home / Self Care | Payer: Medicare Other | Source: Ambulatory Visit | Attending: Vascular Surgery | Admitting: Vascular Surgery

## 2021-04-08 ENCOUNTER — Encounter: Payer: Self-pay | Admitting: Vascular Surgery

## 2021-04-08 ENCOUNTER — Ambulatory Visit (INDEPENDENT_AMBULATORY_CARE_PROVIDER_SITE_OTHER)
Admission: RE | Admit: 2021-04-08 | Discharge: 2021-04-08 | Disposition: A | Discharge status: Home / Self Care | Payer: Medicare Other | Source: Ambulatory Visit | Attending: Vascular Surgery | Admitting: Vascular Surgery

## 2021-04-08 VITALS — BP 174/70 | HR 55 | Temp 97.9°F | Resp 20 | Ht 63.0 in | Wt 162.0 lb

## 2021-04-08 DIAGNOSIS — N184 Chronic kidney disease, stage 4 (severe): Secondary | ICD-10-CM | POA: Diagnosis not present

## 2021-04-08 DIAGNOSIS — N179 Acute kidney failure, unspecified: Secondary | ICD-10-CM | POA: Insufficient documentation

## 2021-04-08 NOTE — Progress Notes (Signed)
Office Note     CC:  ESRD Requesting Provider:  [name], NP  HPI: Robert Lucas is a Right handed 80 y.o. (1941-02-04) male with kidney disease who presents at the request of [name], NP for permanent HD access.  He has had no prior access procedures, including no tunneled lines.  Robert Lucas's renal insufficiency is a product of longstanding diabetes.  He has done very well recently with his A1c under 7. Current GFR 14 - StageV CKD.   On exam, Robert Lucas was doing well, accompanied by his wife.  He had no complaints.  He was hopeful that he could hold off on dialysis for several months as his GFR just increased from 12 to 14  The pt is on a statin for cholesterol management.  The pt is on a daily aspirin.   Other AC:   The pt is on medication for hypertension.   The pt is diabetic.   Tobacco hx:  remote  Past Medical History:  Diagnosis Date   Arthritis    Bell's palsy    one episode   CKD (chronic kidney disease)    [facility] [place]   Diabetes [facility])    type 2   sees endo at [place] in [place]   Hypercholesteremia    Hypertension    Myocardial infarction [facility]) 1990   Neuromuscular disorder ([place])     Past Surgical History:  Procedure Laterality Date   BACK SURGERY  yrs ago   lower   CARDIAC CATHETERIZATION     CHOLECYSTECTOMY N/A 05/02/2017   Procedure: LAPAROSCOPIC CHOLECYSTECTOMY;  Surgeon: Ralene Ok, MD;  Location: [facility];  Service: General;  Laterality: N/A;   CORONARY ANGIOPLASTY     2 1990   KNEE SURGERY Left yrs ago   arthroscopic   ROTATOR CUFF REPAIR Left yrs ago   Page Right 04/23/2013   Procedure: RIGHT SHOULDER ROTATOR CUFF REPAIR WITH GRAFT AND ANCHORS ;  Surgeon: Tobi Bastos, MD;  Location: [facility];  Service: Orthopedics;  Laterality: Right;   TONSILLECTOMY     TOTAL HIP ARTHROPLASTY Right 05/06/2019   Procedure: TOTAL HIP ARTHROPLASTY ANTERIOR APPROACH;  Surgeon: Paralee Cancel, MD;  Location: [facility];   Service: Orthopedics;  Laterality: Right;  70 mins    Social History   Socioeconomic History   Marital status: Married    Spouse name: Not on file   Number of children: 3   Years of education: Not on file   Highest education level: Not on file  Occupational History   Not on file  Tobacco Use   Smoking status: Former    Types: Cigars    Quit date: 07/03/1988    Years since quitting: 32.7   Smokeless tobacco: Never  Vaping Use   Vaping Use: Never used  Substance and Sexual Activity   Alcohol use: Not Currently    Comment: occasional beer   Drug use: No   Sexual activity: Not Currently  Other Topics Concern   Not on file  Social History Narrative   Not on file   Social Determinants of Health   Financial Resource Strain: Not on file  Food Insecurity: Not on file  Transportation Needs: Not on file  Physical Activity: Not on file  Stress: Not on file  Social Connections: Not on file  Intimate Partner Violence: Not on file    Family History  Problem Relation Age of Onset   Dementia Mother    Stroke  Brother     Current Outpatient Medications  Medication Sig Dispense Refill   acetaminophen (TYLENOL) 500 MG tablet Take 1,000 mg by mouth every 6 (six) hours as needed for moderate pain or headache.      allopurinol (ZYLOPRIM) 100 MG tablet Take 100 mg by mouth daily.     amLODipine (NORVASC) 2.5 MG tablet Take 2.5 mg by mouth daily.     aspirin EC 81 MG tablet Take 81 mg by mouth daily.     atorvastatin (LIPITOR) 40 MG tablet Take 40 mg by mouth every evening.     calcitRIOL (ROCALTROL) 0.25 MCG capsule Take 0.25 mcg by mouth every Monday, Wednesday, and Friday.     finasteride (PROSCAR) 5 MG tablet Take 5 mg by mouth daily.     furosemide (LASIX) 40 MG tablet TAKE 1 TABLET BY MOUTH  DAILY AS NEEDED FOR EDEMA 90 tablet 3   insulin aspart protamine - aspart (NOVOLOG MIX 70/30 FLEXPEN) (70-30) 100 UNIT/ML FlexPen Inject 0-20 Units into the skin as directed. If BS is <180=0  units, If BS is 180-200 units=14 units, If BS is >201=20 units     isosorbide dinitrate (ISORDIL) 20 MG tablet Take 1 tablet (20 mg total) by mouth 2 (two) times daily. 180 tablet 3   losartan (COZAAR) 25 MG tablet Take 1 tablet (25 mg total) by mouth daily after supper. 30 tablet 0   metoprolol succinate (TOPROL-XL) 50 MG 24 hr tablet Take 1 tablet (50 mg total) by mouth daily. Take with or immediately following a meal. 30 tablet 0   tamsulosin (FLOMAX) 0.4 MG CAPS capsule Take 1 capsule (0.4 mg total) by mouth daily after supper. 30 capsule 0   hydrALAZINE (APRESOLINE) 50 MG tablet Take 1 tablet (50 mg total) by mouth in the morning and at bedtime. (Patient not taking: Reported on 04/08/2021) 180 tablet 3   No current facility-administered medications for this visit.    Allergies  Allergen Reactions   Lisinopril Cough   Hydrocodone     Other reaction(s): Nausea   Liraglutide     Other reaction(s): GI upset   Prednisone     Elevates BGL; patient prefers to not take this   Sitagliptin     Other reaction(s): Unknown   Penicillins Rash and Other (See Comments)    Rash around ankles Has patient had a PCN reaction causing immediate rash, facial/tongue/throat swelling, SOB or lightheadedness with hypotension: Yes Has patient had a PCN reaction causing severe rash involving mucus membranes or skin necrosis: Unk Has patient had a PCN reaction that required hospitalization: No Has patient had a PCN reaction occurring within the last 10 years: No If all of the above answers are "NO", then may proceed with Cephalosporin use.      REVIEW OF SYSTEMS:   '[X]'$  denotes positive finding, '[ ]'$  denotes negative finding Cardiac  Comments:  Chest pain or chest pressure:    Shortness of breath upon exertion:    Short of breath when lying flat:    Irregular heart rhythm:        Vascular    Pain in calf, thigh, or hip brought on by ambulation:    Pain in feet at night that wakes you up from your  sleep:     Blood clot in your veins:    Leg swelling:         Pulmonary    Oxygen at home:    Productive cough:     Wheezing:  Neurologic    Sudden weakness in arms or legs:     Sudden numbness in arms or legs:     Sudden onset of difficulty speaking or slurred speech:    Temporary loss of vision in one eye:     Problems with dizziness:         Gastrointestinal    Blood in stool:     Vomited blood:         Genitourinary    Burning when urinating:     Blood in urine:        Psychiatric    Major depression:         Hematologic    Bleeding problems:    Problems with blood clotting too easily:        Skin    Rashes or ulcers:        Constitutional    Fever or chills:      PHYSICAL EXAMINATION:  Vitals:   04/08/21 1206  BP: (!) 174/70  Pulse: (!) 55  Resp: 20  Temp: 97.9 F (36.6 C)  SpO2: 97%  Weight: 162 lb (73.5 kg)  Height: '5\' 3"'$  (1.6 m)    General:  WDWN in NAD; vital signs documented above Gait: Not observed HENT: WNL, normocephalic Pulmonary: normal non-labored breathing , without Rales, rhonchi,  wheezing Cardiac: regular HR,  Abdomen: soft, NT, no masses Skin: without rashes Vascular Exam/Pulses:  Right Left  Radial 2+ (normal) 2+ (normal)  Ulnar 2+ (normal) 2+ (normal)                   Extremities: without ischemic changes, without Gangrene , without cellulitis; without open wounds;  Musculoskeletal: no muscle wasting or atrophy  Neurologic: A&O X 3;  No focal weakness or paresthesias are detected Psychiatric:  The pt has Normal affect.   Non-Invasive Vascular Imaging:   Patient's bilateral upper extremity vein mapping reviewed.  Small superficial veins bilaterally. Patient's upper extremity arterial duplex reviewed, normal arteries bilaterally    ASSESSMENT/PLAN:  Robert Lucas is a 80 y.o. male who presents with chronic kidney disease stage 5  Based on vein mapping and examination, this patient's permanent access  options include: AV graft versus possible left brachiocephalic fistula, should the cephalic fistula creased in size over the next month. My hope is that with forearm exercises I can dilate his superficial system, Robert Lucas working right brachiocephalic fistula.  We will see him in 1 month time in an effort to assess this.  Should the size of the cephalic vein not change, the patient's only option will be AV graft, which we can hold on at this time per nephrology. Robert Lucas was asked to use a squeeze ball or grip strengthening device in an effort to dilate his superficial veins bilaterally.  Robert John, MD [facility] [phone]

## 2021-04-11 ENCOUNTER — Other Ambulatory Visit: Payer: Self-pay

## 2021-04-11 DIAGNOSIS — N184 Chronic kidney disease, stage 4 (severe): Secondary | ICD-10-CM

## 2021-04-20 ENCOUNTER — Other Ambulatory Visit: Payer: Self-pay

## 2021-04-20 ENCOUNTER — Telehealth: Payer: Self-pay

## 2021-04-20 DIAGNOSIS — I5020 Unspecified systolic (congestive) heart failure: Secondary | ICD-10-CM

## 2021-04-20 DIAGNOSIS — I252 Old myocardial infarction: Secondary | ICD-10-CM | POA: Diagnosis not present

## 2021-04-20 DIAGNOSIS — E1122 Type 2 diabetes mellitus with diabetic chronic kidney disease: Secondary | ICD-10-CM | POA: Diagnosis not present

## 2021-04-20 DIAGNOSIS — N184 Chronic kidney disease, stage 4 (severe): Secondary | ICD-10-CM | POA: Diagnosis not present

## 2021-04-20 DIAGNOSIS — I10 Essential (primary) hypertension: Secondary | ICD-10-CM

## 2021-04-20 DIAGNOSIS — E782 Mixed hyperlipidemia: Secondary | ICD-10-CM | POA: Diagnosis not present

## 2021-04-20 DIAGNOSIS — I25110 Atherosclerotic heart disease of native coronary artery with unstable angina pectoris: Secondary | ICD-10-CM | POA: Diagnosis not present

## 2021-04-20 DIAGNOSIS — I119 Hypertensive heart disease without heart failure: Secondary | ICD-10-CM | POA: Diagnosis not present

## 2021-04-20 MED ORDER — HYDRALAZINE HCL 50 MG PO TABS: 50.0000 mg | ORAL_TABLET | Freq: Two times a day (BID) | ORAL | 3 refills | Status: DC

## 2021-05-05 NOTE — Progress Notes (Signed)
Office Note     CC:  ESRD Requesting Provider:  [name], NP  HPI: Robert Lucas is a Right handed 80 y.o. (1940-07-18) male with kidney disease who presents in follow up for permanent HD access.  He was seen 1 month ago with only access option being AV graft.  His cephalic vein appeared small.  I asked that he do push-ups as well as forearm exercises and attempt to dilate this vein.   On exam today, Robert Lucas was accompanied by his wife.  He has been doing bilateral upper extremity exercises at night while watching TV.  He has had no medical changes i since last seen 1 month ago.  No complaints.   He has had no prior access procedures, including no tunneled lines.  Robert Lucas's renal insufficiency is a product of longstanding diabetes.  He has done very well recently with his A1c under 7. Current GFR 14 - StageV CKD.   The pt is on a statin for cholesterol management.  The pt is on a daily aspirin.   Other AC:   The pt is on medication for hypertension.   The pt is diabetic.   Tobacco hx:  remote  Past Medical History:  Diagnosis Date   Arthritis    Bell's palsy    one episode   CKD (chronic kidney disease)    [facility] [place]   Diabetes [facility])    type 2   sees endo at [place] in [place]   Hypercholesteremia    Hypertension    Myocardial infarction [facility]) 1990   Neuromuscular disorder ([place])     Past Surgical History:  Procedure Laterality Date   BACK SURGERY  yrs ago   lower   CARDIAC CATHETERIZATION     CHOLECYSTECTOMY N/A 05/02/2017   Procedure: LAPAROSCOPIC CHOLECYSTECTOMY;  Surgeon: Ralene Ok, MD;  Location: [place];  Service: General;  Laterality: N/A;   CORONARY ANGIOPLASTY     2 1990   KNEE SURGERY Left yrs ago   arthroscopic   ROTATOR CUFF REPAIR Left yrs ago   [place] Right 04/23/2013   Procedure: RIGHT SHOULDER ROTATOR CUFF REPAIR WITH GRAFT AND ANCHORS ;  Surgeon: Tobi Bastos, MD;  Location[facility];  Service:  Orthopedics;  Laterality: Right;   TONSILLECTOMY     TOTAL HIP ARTHROPLASTY Right 05/06/2019   Procedure: TOTAL HIP ARTHROPLASTY ANTERIOR APPROACH;  Surgeon: Paralee Cancel, MD;  Location[facility];  Service: Orthopedics;  Laterality: Right;  70 mins    Social History   Socioeconomic History   Marital status: Married    Spouse name: Not on file   Number of children: 3   Years of education: Not on file   Highest education level: Not on file  Occupational History   Not on file  Tobacco Use   Smoking status: Former    Types: Cigars    Quit date: 07/03/1988    Years since quitting: 32.8   Smokeless tobacco: Never  Vaping Use   Vaping Use: Never used  Substance and Sexual Activity   Alcohol use: Not Currently    Comment: occasional beer   Drug use: No   Sexual activity: Not Currently  Other Topics Concern   Not on file  Social History Narrative   Not on file   Social Determinants of Health   Financial Resource Strain: Not on file  Food Insecurity: Not on file  Transportation Needs: Not on file  Physical Activity: Not on  file  Stress: Not on file  Social Connections: Not on file  Intimate Partner Violence: Not on file    Family History  Problem Relation Age of Onset   Dementia Mother    Stroke Brother     Current Outpatient Medications  Medication Sig Dispense Refill   acetaminophen (TYLENOL) 500 MG tablet Take 1,000 mg by mouth every 6 (six) hours as needed for moderate pain or headache.      allopurinol (ZYLOPRIM) 100 MG tablet Take 100 mg by mouth daily.     amLODipine (NORVASC) 2.5 MG tablet Take 2.5 mg by mouth daily.     aspirin EC 81 MG tablet Take 81 mg by mouth daily.     atorvastatin (LIPITOR) 40 MG tablet Take 40 mg by mouth every evening.     calcitRIOL (ROCALTROL) 0.25 MCG capsule Take 0.25 mcg by mouth every Monday, Wednesday, and Friday.     finasteride (PROSCAR) 5 MG tablet Take 5 mg by mouth daily.     furosemide (LASIX) 40 MG tablet TAKE 1 TABLET BY  MOUTH  DAILY AS NEEDED FOR EDEMA 90 tablet 3   hydrALAZINE (APRESOLINE) 50 MG tablet Take 1 tablet (50 mg total) by mouth in the morning and at bedtime. 180 tablet 3   insulin aspart protamine - aspart (NOVOLOG MIX 70/30 FLEXPEN) (70-30) 100 UNIT/ML FlexPen Inject 0-20 Units into the skin as directed. If BS is <180=0 units, If BS is 180-200 units=14 units, If BS is >201=20 units     isosorbide dinitrate (ISORDIL) 20 MG tablet Take 1 tablet (20 mg total) by mouth 2 (two) times daily. 180 tablet 3   losartan (COZAAR) 25 MG tablet Take 1 tablet (25 mg total) by mouth daily after supper. 30 tablet 0   metoprolol succinate (TOPROL-XL) 50 MG 24 hr tablet Take 1 tablet (50 mg total) by mouth daily. Take with or immediately following a meal. 30 tablet 0   tamsulosin (FLOMAX) 0.4 MG CAPS capsule Take 1 capsule (0.4 mg total) by mouth daily after supper. 30 capsule 0   No current facility-administered medications for this visit.    Allergies  Allergen Reactions   Lisinopril Cough   Hydrocodone     Other reaction(s): Nausea   Liraglutide     Other reaction(s): GI upset   Prednisone     Elevates BGL; patient prefers to not take this   Sitagliptin     Other reaction(s): Unknown   Penicillins Rash and Other (See Comments)    Rash around ankles Has patient had a PCN reaction causing immediate rash, facial/tongue/throat swelling, SOB or lightheadedness with hypotension: Yes Has patient had a PCN reaction causing severe rash involving mucus membranes or skin necrosis: Unk Has patient had a PCN reaction that required hospitalization: No Has patient had a PCN reaction occurring within the last 10 years: No If all of the above answers are "NO", then may proceed with Cephalosporin use.      REVIEW OF SYSTEMS:   [X]  denotes positive finding, [ ]  denotes negative finding Cardiac  Comments:  Chest pain or chest pressure:    Shortness of breath upon exertion:    Short of breath when lying flat:     Irregular heart rhythm:        Vascular    Pain in calf, thigh, or hip brought on by ambulation:    Pain in feet at night that wakes you up from your sleep:     Blood clot in your  veins:    Leg swelling:         Pulmonary    Oxygen at home:    Productive cough:     Wheezing:         Neurologic    Sudden weakness in arms or legs:     Sudden numbness in arms or legs:     Sudden onset of difficulty speaking or slurred speech:    Temporary loss of vision in one eye:     Problems with dizziness:         Gastrointestinal    Blood in stool:     Vomited blood:         Genitourinary    Burning when urinating:     Blood in urine:        Psychiatric    Major depression:         Hematologic    Bleeding problems:    Problems with blood clotting too easily:        Skin    Rashes or ulcers:        Constitutional    Fever or chills:      PHYSICAL EXAMINATION:  There were no vitals filed for this visit.   General:  WDWN in NAD; vital signs documented above Gait: Not observed HENT: WNL, normocephalic Pulmonary: normal non-labored breathing , without Rales, rhonchi,  wheezing Cardiac: regular HR,  Abdomen: soft, NT, no masses Skin: without rashes Vascular Exam/Pulses:  Right Left  Radial 2+ (normal) 2+ (normal)  Ulnar 2+ (normal) 2+ (normal)                   Extremities: without ischemic changes, without Gangrene , without cellulitis; without open wounds;  Musculoskeletal: no muscle wasting or atrophy  Neurologic: A&O X 3;  No focal weakness or paresthesias are detected Psychiatric:  The pt has Normal affect.   Non-Invasive Vascular Imaging:   Noninvasive bilateral upper extremity venous duplex ultrasonography demonstrated improvement in diameter of the cephalic and basilic veins bilaterally. The left cephalic vein has adequate size for fistula creation   ASSESSMENT/PLAN:  JOSHA WEEKLEY is a 80 y.o. male who presents with chronic kidney disease stage  5  The patient had significant improvement in superficial vein diameter with upper extremity exercise over the last month. Being that Robert Lucas is right-handed, we discussed left-sided brachiocephalic fistula creation After discussing the risks and benefits of surgery, Robert Lucas elected to proceed. He was asked to continue his current medication regimen. My office will schedule in the coming weeks.   Robert John, MD [facility] [phone]

## 2021-05-06 ENCOUNTER — Ambulatory Visit: Payer: Medicare Other | Admitting: Vascular Surgery

## 2021-05-06 ENCOUNTER — Other Ambulatory Visit: Payer: Self-pay

## 2021-05-06 ENCOUNTER — Encounter: Payer: Self-pay | Admitting: Vascular Surgery

## 2021-05-06 ENCOUNTER — Ambulatory Visit (HOSPITAL_COMMUNITY)
Admission: RE | Admit: 2021-05-06 | Discharge: 2021-05-06 | Disposition: A | Discharge status: Home / Self Care | Payer: Medicare Other | Source: Ambulatory Visit | Attending: Vascular Surgery | Admitting: Vascular Surgery

## 2021-05-06 VITALS — BP 170/81 | HR 64 | Temp 98.0°F | Resp 20 | Ht 63.0 in | Wt 165.0 lb

## 2021-05-06 DIAGNOSIS — N184 Chronic kidney disease, stage 4 (severe): Secondary | ICD-10-CM

## 2021-05-13 ENCOUNTER — Ambulatory Visit: Payer: Medicare Other | Admitting: Vascular Surgery

## 2021-05-13 ENCOUNTER — Encounter (HOSPITAL_COMMUNITY): Payer: Medicare Other

## 2021-05-16 ENCOUNTER — Encounter (HOSPITAL_COMMUNITY): Payer: Self-pay | Admitting: Vascular Surgery

## 2021-05-16 ENCOUNTER — Other Ambulatory Visit: Payer: Self-pay

## 2021-05-16 NOTE — Progress Notes (Signed)
PCP - [name], NP Cardiologist - Dr. Virgina Jock  EKG - 0/21/22 Chest x-ray -  ECHO - 11/15/20 Cardiac Cath -  CPAP -   Fasting Blood Sugar:  130-140 Checks Blood Sugar:  1x/day  Blood Thinner Instructions:  Aspirin Instructions: Follow your surgeon's instructions on when to stop Aspirin.  If no instructions were given by your surgeon then you will need to call the office to get those instructions.    OK to cont ASA per [facility] protocol   ERAS Protcol -   COVID TEST- n/a  Anesthesia review: n/a  -------------  SDW INSTRUCTIONS:  Your procedure is scheduled on Tuesday 11/15. Please report to [facility] Main Entrance "A" at 0530 A.M., and check in at the Admitting office. Call this number if you have problems the morning of surgery: [phone]   Remember: Do not eat or drink after midnight the night before your surgery   Medications to take morning of surgery with a sip of water include: acetaminophen (TYLENOL) allopurinol (ZYLOPRIM)  amLODipine (NORVASC)  isosorbide dinitrate (ISORDIL)  metoprolol succinate (TOPROL-XL)  fluticasone (FLONASE)   ** PLEASE check your blood sugar the morning of your surgery when you wake up and every 2 hours until you get to the Short Stay unit.  If your blood sugar is less than 70 mg/dL, you will need to treat for low blood sugar: Do not take insulin. Treat a low blood sugar (less than 70 mg/dL) with  cup of clear juice (cranberry or apple), 4 glucose tablets, OR glucose gel. Recheck blood sugar in 15 minutes after treatment (to make sure it is greater than 70 mg/dL). If your blood sugar is not greater than 70 mg/dL on recheck, call [phone] for further instructions.  insulin aspart protamine - aspart (NOVOLOG MIX 70/30 FLEXPEN) (70-30)  11/14: 180-200 take 9.8 units >201 take 14units  11/15: NONE   As of today, STOP taking any Aleve, Naproxen, Ibuprofen, Motrin, Advil, Goody's, BC's, all herbal medications, fish oil, and all  vitamins.    The Morning of Surgery Do not wear jewelry Do not wear lotions, powders, olognes, or deodorant  Do not bring valuables to the hospital. [facility] is not responsible for any belongings or valuables.  If you are a smoker, DO NOT Smoke 24 hours prior to surgery  If you wear a CPAP at night please bring your mask the morning of surgery   Remember that you must have someone to transport you home after your surgery, and remain with you for 24 hours if you are discharged the same day.  Please bring cases for contacts, glasses, hearing aids, dentures or bridgework because it cannot be worn into surgery.   Patients discharged the day of surgery will not be allowed to drive home.   Please shower the NIGHT BEFORE/MORNING OF SURGERY (use antibacterial soap like DIAL soap if possible). Wear comfortable clothes the morning of surgery. Oral Hygiene is also important to reduce your risk of infection.  Remember - BRUSH YOUR TEETH THE MORNING OF SURGERY WITH YOUR REGULAR TOOTHPASTE  Patient denies shortness of breath, fever, cough and chest pain.

## 2021-05-16 NOTE — Anesthesia Preprocedure Evaluation (Addendum)
Anesthesia Evaluation  Patient identified by MRN, date of birth, ID band Patient awake    Reviewed: Allergy & Precautions, NPO status , Patient's Chart, lab work & pertinent test results  History of Anesthesia Complications (+) PONV and history of anesthetic complications  Airway Mallampati: III  TM Distance: >3 FB Neck ROM: Full    Dental no notable dental hx.    Pulmonary former smoker,    Pulmonary exam normal breath sounds clear to auscultation       Cardiovascular hypertension, Pt. on medications and Pt. on home beta blockers + CAD and + Past MI  Normal cardiovascular exam Rhythm:Regular Rate:Normal  ECG: SR, rate 62   Neuro/Psych  Neuromuscular disease negative psych ROS   GI/Hepatic negative GI ROS, Neg liver ROS,   Endo/Other  diabetes, Insulin Dependent  Renal/GU ESRFRenal disease     Musculoskeletal  (+) Arthritis , Gout   Abdominal   Peds  Hematology  (+) anemia , HLD   Anesthesia Other Findings ESRD  Reproductive/Obstetrics                            Anesthesia Physical Anesthesia Plan  ASA: 3  Anesthesia Plan: Regional   Post-op Pain Management:    Induction: Intravenous  PONV Risk Score and Plan: 2 and Ondansetron, Propofol infusion, Dexamethasone and Treatment may vary due to age or medical condition  Airway Management Planned: Simple Face Mask  Additional Equipment:   Intra-op Plan:   Post-operative Plan:   Informed Consent: I have reviewed the patients History and Physical, chart, labs and discussed the procedure including the risks, benefits and alternatives for the proposed anesthesia with the patient or authorized representative who has indicated his/her understanding and acceptance.     Dental advisory given  Plan Discussed with: CRNA  Anesthesia Plan Comments:        Anesthesia Quick Evaluation

## 2021-05-17 ENCOUNTER — Ambulatory Visit (HOSPITAL_COMMUNITY): Payer: Medicare Other | Admitting: Anesthesiology

## 2021-05-17 ENCOUNTER — Ambulatory Visit (HOSPITAL_COMMUNITY)
Admission: RE | Admit: 2021-05-17 | Discharge: 2021-05-17 | Disposition: A | Discharge status: Home / Self Care | Payer: Medicare Other | Source: Ambulatory Visit | Attending: Vascular Surgery | Admitting: Vascular Surgery

## 2021-05-17 ENCOUNTER — Encounter (HOSPITAL_COMMUNITY)
Admission: RE | Disposition: A | Discharge status: Home / Self Care | Payer: Self-pay | Source: Ambulatory Visit | Attending: Vascular Surgery

## 2021-05-17 ENCOUNTER — Encounter (HOSPITAL_COMMUNITY): Payer: Self-pay | Admitting: Vascular Surgery

## 2021-05-17 ENCOUNTER — Other Ambulatory Visit: Payer: Self-pay

## 2021-05-17 DIAGNOSIS — N185 Chronic kidney disease, stage 5: Secondary | ICD-10-CM

## 2021-05-17 DIAGNOSIS — E1122 Type 2 diabetes mellitus with diabetic chronic kidney disease: Secondary | ICD-10-CM | POA: Insufficient documentation

## 2021-05-17 DIAGNOSIS — I120 Hypertensive chronic kidney disease with stage 5 chronic kidney disease or end stage renal disease: Secondary | ICD-10-CM | POA: Insufficient documentation

## 2021-05-17 DIAGNOSIS — D631 Anemia in chronic kidney disease: Secondary | ICD-10-CM | POA: Diagnosis not present

## 2021-05-17 DIAGNOSIS — I132 Hypertensive heart and chronic kidney disease with heart failure and with stage 5 chronic kidney disease, or end stage renal disease: Secondary | ICD-10-CM | POA: Diagnosis not present

## 2021-05-17 DIAGNOSIS — Z992 Dependence on renal dialysis: Secondary | ICD-10-CM | POA: Insufficient documentation

## 2021-05-17 DIAGNOSIS — N186 End stage renal disease: Secondary | ICD-10-CM | POA: Insufficient documentation

## 2021-05-17 DIAGNOSIS — I5020 Unspecified systolic (congestive) heart failure: Secondary | ICD-10-CM | POA: Diagnosis not present

## 2021-05-17 DIAGNOSIS — Z87891 Personal history of nicotine dependence: Secondary | ICD-10-CM | POA: Diagnosis not present

## 2021-05-17 DIAGNOSIS — R29818 Other symptoms and signs involving the nervous system: Secondary | ICD-10-CM | POA: Insufficient documentation

## 2021-05-17 DIAGNOSIS — Z7982 Long term (current) use of aspirin: Secondary | ICD-10-CM | POA: Diagnosis not present

## 2021-05-17 DIAGNOSIS — Z794 Long term (current) use of insulin: Secondary | ICD-10-CM | POA: Insufficient documentation

## 2021-05-17 DIAGNOSIS — Z79899 Other long term (current) drug therapy: Secondary | ICD-10-CM | POA: Insufficient documentation

## 2021-05-17 DIAGNOSIS — I129 Hypertensive chronic kidney disease with stage 1 through stage 4 chronic kidney disease, or unspecified chronic kidney disease: Secondary | ICD-10-CM

## 2021-05-17 HISTORY — PX: AV FISTULA PLACEMENT: SHX1204

## 2021-05-17 HISTORY — DX: Other specified postprocedural states: R11.2

## 2021-05-17 HISTORY — DX: Other specified postprocedural states: Z98.890

## 2021-05-17 LAB — GLUCOSE, CAPILLARY
Glucose-Capillary: 148 mg/dL — ABNORMAL HIGH (ref 70–99)
Glucose-Capillary: 198 mg/dL — ABNORMAL HIGH (ref 70–99)

## 2021-05-17 LAB — POCT I-STAT, CHEM 8
BUN: 87 mg/dL — ABNORMAL HIGH (ref 8–23)
Calcium, Ion: 1.03 mmol/L — ABNORMAL LOW (ref 1.15–1.40)
Chloride: 113 mmol/L — ABNORMAL HIGH (ref 98–111)
Creatinine, Ser: 5.5 mg/dL — ABNORMAL HIGH (ref 0.61–1.24)
Glucose, Bld: 156 mg/dL — ABNORMAL HIGH (ref 70–99)
HCT: 28 % — ABNORMAL LOW (ref 39.0–52.0)
Hemoglobin: 9.5 g/dL — ABNORMAL LOW (ref 13.0–17.0)
Potassium: 3.9 mmol/L (ref 3.5–5.1)
Sodium: 139 mmol/L (ref 135–145)
TCO2: 15 mmol/L — ABNORMAL LOW (ref 22–32)

## 2021-05-17 SURGERY — ARTERIOVENOUS (AV) FISTULA CREATION
Anesthesia: Regional | Site: Arm Lower | Laterality: Left

## 2021-05-17 MED ORDER — ORAL CARE MOUTH RINSE: 15.0000 mL | Freq: Once | OROMUCOSAL | Status: AC

## 2021-05-17 MED ORDER — ROCURONIUM BROMIDE 10 MG/ML (PF) SYRINGE
PREFILLED_SYRINGE | INTRAVENOUS | Status: AC
  Filled 2021-05-17: qty 10

## 2021-05-17 MED ORDER — HEMOSTATIC AGENTS (NO CHARGE) OPTIME
TOPICAL | Status: DC | PRN
  Administered 2021-05-17: 1 via TOPICAL

## 2021-05-17 MED ORDER — LIDOCAINE-EPINEPHRINE (PF) 2 %-1:200000 IJ SOLN
INTRAMUSCULAR | Status: AC
  Filled 2021-05-17: qty 20

## 2021-05-17 MED ORDER — ONDANSETRON HCL 4 MG/2ML IJ SOLN: 4.0000 mg | Freq: Once | INTRAMUSCULAR | Status: DC | PRN

## 2021-05-17 MED ORDER — FENTANYL CITRATE (PF) 250 MCG/5ML IJ SOLN
INTRAMUSCULAR | Status: DC | PRN
  Administered 2021-05-17 (×2): 25 ug via INTRAVENOUS
  Administered 2021-05-17: 50 ug via INTRAVENOUS

## 2021-05-17 MED ORDER — LIDOCAINE HCL 1 % IJ SOLN
INTRAMUSCULAR | Status: AC
  Filled 2021-05-17: qty 20

## 2021-05-17 MED ORDER — 0.9 % SODIUM CHLORIDE (POUR BTL) OPTIME
TOPICAL | Status: DC | PRN
  Administered 2021-05-17: 1000 mL

## 2021-05-17 MED ORDER — TRAMADOL HCL 50 MG PO TABS: 50.0000 mg | ORAL_TABLET | Freq: Four times a day (QID) | ORAL | 0 refills | Status: DC | PRN

## 2021-05-17 MED ORDER — DEXAMETHASONE SODIUM PHOSPHATE 10 MG/ML IJ SOLN
INTRAMUSCULAR | Status: AC
  Filled 2021-05-17: qty 1

## 2021-05-17 MED ORDER — MIDAZOLAM HCL 2 MG/2ML IJ SOLN
INTRAMUSCULAR | Status: DC | PRN
  Administered 2021-05-17 (×3): 1 mg via INTRAVENOUS

## 2021-05-17 MED ORDER — FENTANYL CITRATE (PF) 100 MCG/2ML IJ SOLN: 25.0000 ug | INTRAMUSCULAR | Status: DC | PRN

## 2021-05-17 MED ORDER — PROPOFOL 10 MG/ML IV BOLUS
INTRAVENOUS | Status: AC
  Filled 2021-05-17: qty 20

## 2021-05-17 MED ORDER — PROPOFOL 10 MG/ML IV BOLUS
INTRAVENOUS | Status: DC | PRN
  Administered 2021-05-17: 10 mg via INTRAVENOUS
  Administered 2021-05-17: 20 mg via INTRAVENOUS

## 2021-05-17 MED ORDER — LIDOCAINE-EPINEPHRINE (PF) 1 %-1:200000 IJ SOLN
INTRAMUSCULAR | Status: AC
  Filled 2021-05-17: qty 30

## 2021-05-17 MED ORDER — VANCOMYCIN HCL IN DEXTROSE 1-5 GM/200ML-% IV SOLN
1000.0000 mg | INTRAVENOUS | Status: AC
  Administered 2021-05-17: 1000 mg via INTRAVENOUS
  Filled 2021-05-17: qty 200

## 2021-05-17 MED ORDER — PROPOFOL 500 MG/50ML IV EMUL
INTRAVENOUS | Status: DC | PRN
  Administered 2021-05-17: 25 ug/kg/min via INTRAVENOUS

## 2021-05-17 MED ORDER — ONDANSETRON HCL 4 MG/2ML IJ SOLN
INTRAMUSCULAR | Status: DC | PRN
  Administered 2021-05-17: 4 mg via INTRAVENOUS

## 2021-05-17 MED ORDER — CHLORHEXIDINE GLUCONATE 0.12 % MT SOLN
OROMUCOSAL | Status: AC
  Administered 2021-05-17: 15 mL via OROMUCOSAL
  Filled 2021-05-17: qty 15

## 2021-05-17 MED ORDER — EPHEDRINE SULFATE-NACL 50-0.9 MG/10ML-% IV SOSY
PREFILLED_SYRINGE | INTRAVENOUS | Status: DC | PRN
Start: 2021-05-17 — End: 2021-05-17
  Administered 2021-05-17: 10 mg via INTRAVENOUS

## 2021-05-17 MED ORDER — DEXAMETHASONE SODIUM PHOSPHATE 10 MG/ML IJ SOLN
INTRAMUSCULAR | Status: DC | PRN
  Administered 2021-05-17: 10 mg via INTRAVENOUS

## 2021-05-17 MED ORDER — SODIUM CHLORIDE 0.9 % IV SOLN: INTRAVENOUS | Status: DC

## 2021-05-17 MED ORDER — CHLORHEXIDINE GLUCONATE 0.12 % MT SOLN: 15.0000 mL | Freq: Once | OROMUCOSAL | Status: AC

## 2021-05-17 MED ORDER — LIDOCAINE 2% (20 MG/ML) 5 ML SYRINGE
INTRAMUSCULAR | Status: AC
  Filled 2021-05-17: qty 5

## 2021-05-17 MED ORDER — SUCCINYLCHOLINE CHLORIDE 200 MG/10ML IV SOSY
PREFILLED_SYRINGE | INTRAVENOUS | Status: AC
  Filled 2021-05-17: qty 10

## 2021-05-17 MED ORDER — ACETAMINOPHEN 10 MG/ML IV SOLN: 1000.0000 mg | Freq: Once | INTRAVENOUS | Status: DC | PRN

## 2021-05-17 MED ORDER — MIDAZOLAM HCL 2 MG/2ML IJ SOLN
INTRAMUSCULAR | Status: AC
  Filled 2021-05-17: qty 2

## 2021-05-17 MED ORDER — ONDANSETRON HCL 4 MG/2ML IJ SOLN
INTRAMUSCULAR | Status: AC
  Filled 2021-05-17: qty 2

## 2021-05-17 MED ORDER — CHLORHEXIDINE GLUCONATE 4 % EX LIQD: 60.0000 mL | Freq: Once | CUTANEOUS | Status: DC

## 2021-05-17 MED ORDER — AMISULPRIDE (ANTIEMETIC) 5 MG/2ML IV SOLN: 5.0000 mg | Freq: Once | INTRAVENOUS | Status: DC | PRN

## 2021-05-17 MED ORDER — FENTANYL CITRATE (PF) 250 MCG/5ML IJ SOLN
INTRAMUSCULAR | Status: AC
  Filled 2021-05-17: qty 5

## 2021-05-17 MED ORDER — HEPARIN 6000 UNIT IRRIGATION SOLUTION
Status: AC
  Filled 2021-05-17: qty 500

## 2021-05-17 MED ORDER — LIDOCAINE-EPINEPHRINE 2 %-1:100000 IJ SOLN
INTRAMUSCULAR | Status: DC | PRN
  Administered 2021-05-17: 20 mL via PERINEURAL

## 2021-05-17 MED ORDER — HEPARIN SODIUM (PORCINE) 1000 UNIT/ML IJ SOLN
INTRAMUSCULAR | Status: DC | PRN
  Administered 2021-05-17: 2000 [IU] via INTRAVENOUS

## 2021-05-17 MED ORDER — HEPARIN 6000 UNIT IRRIGATION SOLUTION
Status: DC | PRN
  Administered 2021-05-17: 1

## 2021-05-17 SURGICAL SUPPLY — 41 items
ADH SKN CLS APL DERMABOND .7 (GAUZE/BANDAGES/DRESSINGS) ×1
ADH SKN CLS LQ APL DERMABOND (GAUZE/BANDAGES/DRESSINGS) ×1
AGENT HMST SPONGE THK3/8 (HEMOSTASIS)
ARMBAND PINK RESTRICT EXTREMIT (MISCELLANEOUS) ×6 IMPLANT
BAG COUNTER SPONGE SURGICOUNT (BAG) ×2 IMPLANT
BAG SPNG CNTER NS LX DISP (BAG) ×1
BAG SURGICOUNT SPONGE COUNTING (BAG) ×1
BLADE CLIPPER SURG (BLADE) ×3 IMPLANT
CANISTER SUCT 3000ML PPV (MISCELLANEOUS) ×3 IMPLANT
CLIP VESOCCLUDE MED 6/CT (CLIP) ×3 IMPLANT
CLIP VESOCCLUDE SM WIDE 6/CT (CLIP) ×5 IMPLANT
COVER PROBE W GEL 5X96 (DRAPES) ×3 IMPLANT
DECANTER SPIKE VIAL GLASS SM (MISCELLANEOUS) ×3 IMPLANT
DERMABOND ADHESIVE PROPEN (GAUZE/BANDAGES/DRESSINGS) ×2
DERMABOND ADVANCED (GAUZE/BANDAGES/DRESSINGS) ×2
DERMABOND ADVANCED .7 DNX12 (GAUZE/BANDAGES/DRESSINGS) ×1 IMPLANT
DERMABOND ADVANCED .7 DNX6 (GAUZE/BANDAGES/DRESSINGS) IMPLANT
ELECT REM PT RETURN 9FT ADLT (ELECTROSURGICAL) ×3
ELECTRODE REM PT RTRN 9FT ADLT (ELECTROSURGICAL) ×1 IMPLANT
GLOVE SRG 8 PF TXTR STRL LF DI (GLOVE) ×2 IMPLANT
GLOVE SURG POLYISO LF SZ8 (GLOVE) IMPLANT
GLOVE SURG UNDER POLY LF SZ8 (GLOVE) ×6
GOWN STRL REUS W/ TWL LRG LVL3 (GOWN DISPOSABLE) ×2 IMPLANT
GOWN STRL REUS W/TWL 2XL LVL3 (GOWN DISPOSABLE) ×3 IMPLANT
GOWN STRL REUS W/TWL LRG LVL3 (GOWN DISPOSABLE) ×6
HEMOSTAT SNOW SURGICEL 2X4 (HEMOSTASIS) ×2 IMPLANT
HEMOSTAT SPONGE AVITENE ULTRA (HEMOSTASIS) IMPLANT
KIT BASIN OR (CUSTOM PROCEDURE TRAY) ×3 IMPLANT
KIT TURNOVER KIT B (KITS) ×3 IMPLANT
NS IRRIG 1000ML POUR BTL (IV SOLUTION) ×3 IMPLANT
PACK CV ACCESS (CUSTOM PROCEDURE TRAY) ×3 IMPLANT
PAD ARMBOARD 7.5X6 YLW CONV (MISCELLANEOUS) ×6 IMPLANT
SUT MNCRL AB 4-0 PS2 18 (SUTURE) ×3 IMPLANT
SUT PROLENE 6 0 BV (SUTURE) ×5 IMPLANT
SUT PROLENE 7 0 BV 1 (SUTURE) IMPLANT
SUT SILK 2 0 SH (SUTURE) ×1 IMPLANT
SUT VIC AB 3-0 SH 27 (SUTURE) ×3
SUT VIC AB 3-0 SH 27X BRD (SUTURE) ×1 IMPLANT
TOWEL GREEN STERILE (TOWEL DISPOSABLE) ×3 IMPLANT
UNDERPAD 30X36 HEAVY ABSORB (UNDERPADS AND DIAPERS) ×3 IMPLANT
WATER STERILE IRR 1000ML POUR (IV SOLUTION) ×3 IMPLANT

## 2021-05-17 NOTE — H&P (Signed)
Office Note   The patient seen and examined in preoperative holding. No complaints No changes in medications. No changes in physical exam. After discussing the risks and benefits of left-arm AVF, Robert Lucas elected to proceed.   Robert Lucas   CC:  ESRD Requesting Provider:  No ref. provider found  HPI: Robert Lucas is a Right handed 80 y.o. (Dec 01, 1940) male with kidney disease who presents in follow up for permanent HD access.  He was seen 1 month ago with only access option being AV graft.  His cephalic vein appeared small.  I asked that he do push-ups as well as forearm exercises and attempt to dilate this vein.   On exam today, Robert Lucas was accompanied by his wife.  He has been doing bilateral upper extremity exercises at night while watching TV.  He has had no medical changes i since last seen 1 month ago.  No complaints.   He has had no prior access procedures, including no tunneled lines.  Robert Lucas's renal insufficiency is a product of longstanding diabetes.  He has done very well recently with his A1c under 7. Current GFR 14 - StageV CKD.   The pt is on a statin for cholesterol management.  The pt is on a daily aspirin.   Other AC:   The pt is on medication for hypertension.   The pt is diabetic.   Tobacco hx:  remote  Past Medical History:  Diagnosis Date   Arthritis    Bell's palsy    one episode   CKD (chronic kidney disease)    [facility] [place]   Diabetes [facility])    type 2   sees endo at [place] in [place]   Hypercholesteremia    Hypertension    Myocardial infarction [facility]) 1990   Neuromuscular disorder ([place])    PONV (postoperative nausea and vomiting)     Past Surgical History:  Procedure Laterality Date   BACK SURGERY  yrs ago   lower   CARDIAC CATHETERIZATION     CATARACT EXTRACTION Right 01/2021   CHOLECYSTECTOMY N/A 05/02/2017   Procedure: LAPAROSCOPIC CHOLECYSTECTOMY;  Surgeon: Ralene Ok, MD;  Location: [place];  Service: General;  Laterality:  N/A;   CORONARY ANGIOPLASTY     2 1990   EYE SURGERY Left 03/2021   cataract removal   KNEE SURGERY Left yrs ago   arthroscopic   ROTATOR CUFF REPAIR Left yrs ago   [place] Right 04/23/2013   Procedure: RIGHT SHOULDER ROTATOR CUFF REPAIR WITH GRAFT AND ANCHORS ;  Surgeon: Tobi Bastos, MD;  Location[facility];  Service: Orthopedics;  Laterality: Right;   TONSILLECTOMY     TOTAL HIP ARTHROPLASTY Right 05/06/2019   Procedure: TOTAL HIP ARTHROPLASTY ANTERIOR APPROACH;  Surgeon: Paralee Cancel, MD;  [facility];  Service: Orthopedics;  Laterality: Right;  70 mins    Social History   Socioeconomic History   Marital status: Married    Spouse name: Not on file   Number of children: 3   Years of education: Not on file   Highest education level: Not on file  Occupational History   Not on file  Tobacco Use   Smoking status: Former    Types: Cigars    Quit date: 07/03/1988    Years since quitting: 32.8   Smokeless tobacco: Never  Vaping Use   Vaping Use: Never used  Substance and Sexual Activity   Alcohol use: Not Currently    Comment:  occasional beer   Drug use: No   Sexual activity: Not Currently  Other Topics Concern   Not on file  Social History Narrative   Not on file   Social Determinants of Health   Financial Resource Strain: Not on file  Food Insecurity: Not on file  Transportation Needs: Not on file  Physical Activity: Not on file  Stress: Not on file  Social Connections: Not on file  Intimate Partner Violence: Not on file    Family History  Problem Relation Age of Onset   Dementia Mother    Stroke Brother     Current Facility-Administered Medications  Medication Dose Route Frequency Provider Last Rate Last Admin   0.9 %  sodium chloride infusion   Intravenous Continuous Robert John, MD 10 mL/hr at 05/17/21 0621 New Bag at 05/17/21 0621   chlorhexidine (HIBICLENS) 4 % liquid 4 application  60 mL Topical Once Robert John, MD       And   [START ON 05/18/2021] chlorhexidine (HIBICLENS) 4 % liquid 4 application  60 mL Topical Once Robert John, MD       vancomycin (VANCOCIN) IVPB 1000 mg/200 mL premix  1,000 mg Intravenous 60 min Pre-Op Robert John, MD 200 mL/hr at 05/17/21 0628 1,000 mg at 05/17/21 5885    Allergies  Allergen Reactions   Lisinopril Cough   Hydrocodone Nausea Only   Liraglutide     GI upset   Prednisone     Elevates BGL; patient prefers to not take this   Sitagliptin     Unknown   Penicillins Rash and Other (See Comments)    Rash around ankles Has patient had a PCN reaction causing immediate rash, facial/tongue/throat swelling, SOB or lightheadedness with hypotension: Yes Has patient had a PCN reaction causing severe rash involving mucus membranes or skin necrosis: Unk Has patient had a PCN reaction that required hospitalization: No Has patient had a PCN reaction occurring within the last 10 years: No If all of the above answers are "NO", then may proceed with Cephalosporin use.      REVIEW OF SYSTEMS:   [X]  denotes positive finding, [ ]  denotes negative finding Cardiac  Comments:  Chest pain or chest pressure:    Shortness of breath upon exertion:    Short of breath when lying flat:    Irregular heart rhythm:        Vascular    Pain in calf, thigh, or hip brought on by ambulation:    Pain in feet at night that wakes you up from your sleep:     Blood clot in your veins:    Leg swelling:         Pulmonary    Oxygen at home:    Productive cough:     Wheezing:         Neurologic    Sudden weakness in arms or legs:     Sudden numbness in arms or legs:     Sudden onset of difficulty speaking or slurred speech:    Temporary loss of vision in one eye:     Problems with dizziness:         Gastrointestinal    Blood in stool:     Vomited blood:         Genitourinary    Burning when urinating:     Blood in urine:        Psychiatric    Major  depression:  Hematologic    Bleeding problems:    Problems with blood clotting too easily:        Skin    Rashes or ulcers:        Constitutional    Fever or chills:      PHYSICAL EXAMINATION:  Vitals:   05/16/21 1322 05/17/21 0615  BP:  (!) 169/69  Pulse:  (!) 58  Resp:  17  Temp:  97.7 F (36.5 C)  TempSrc:  Oral  SpO2:  99%  Weight: 73.5 kg 73.5 kg  Height: 5\' 3"  (1.6 m) 5\' 3"  (1.6 m)     General:  WDWN in NAD; vital signs documented above Gait: Not observed HENT: WNL, normocephalic Pulmonary: normal non-labored breathing , without Rales, rhonchi,  wheezing Cardiac: regular HR,  Abdomen: soft, NT, no masses Skin: without rashes Vascular Exam/Pulses:  Right Left  Radial 2+ (normal) 2+ (normal)  Ulnar 2+ (normal) 2+ (normal)                   Extremities: without ischemic changes, without Gangrene , without cellulitis; without open wounds;  Musculoskeletal: no muscle wasting or atrophy  Neurologic: A&O X 3;  No focal weakness or paresthesias are detected Psychiatric:  The pt has Normal affect.   Non-Invasive Vascular Imaging:   Noninvasive bilateral upper extremity venous duplex ultrasonography demonstrated improvement in diameter of the cephalic and basilic veins bilaterally. The left cephalic vein has adequate size for fistula creation   ASSESSMENT/PLAN:  BELINDA BRINGHURST is a 80 y.o. male who presents with chronic kidney disease stage 5  The patient had significant improvement in superficial vein diameter with upper extremity exercise over the last month. Being that Draiden is right-handed, we discussed left-sided brachiocephalic fistula creation After discussing the risks and benefits of surgery, Markevious elected to proceed. He was asked to continue his current medication regimen. My office will schedule in the coming weeks.   Robert John, MD [facility] [phone]

## 2021-05-17 NOTE — Op Note (Signed)
    NAME: [name]    MRN: [id number] DOB: 01/05/1941    DATE OF OPERATION: 05/17/2021  PREOP DIAGNOSIS:    Chronic kidney disease-stage V  POSTOP DIAGNOSIS:    Same  PROCEDURE:    LEFT brachiocephalic fistula  SURGEON: Broadus John  ASSIST: Dagoberto Ligas  ANESTHESIA: moderate   EBL: 34ml  INDICATIONS:    Robert Lucas is a 80 y.o. male who presents with chronic kidney disease stage 5.   FINDINGS:   29mm left cephalic vein 99mm left brachial artery  TECHNIQUE:   The patient was brought to the operating room and placed in supine position. The LEFT arm was prepped and draped in a standard fashion. IV antibiotics were prior to incision. A timeout was performed.  Ultrasound was used to insonate the entirety of the cephalic vein which demonstrated adequate size for fistula creation.  A transverse incision was made immediately below the elbow creese in the antecubital fossa. The  cephalic vein was isolated for 3 cm in length and ligated distally with a 2-0 silk stick-tie. The vein was flushed with heparin saline. The bicipital aponeurosis was partially released and the brachial artery freed from its paired brachial veins and secured with a vessel loop. The vein was juxtaposed to the brachial artery. The patient was heparinized. Vascular clamps were placed proximally and distally on the brachial artery and a 5 mm arteriotomy was created on the brachial artery. This was flushed with heparin saline.  An anastomosis was created in end to side fashion on the brachial artery using running 6-0 Prolene suture.  Prior to completing the anastomsis, the vessels were flushed and the suture line was tied down. There was a thrill in the cephalic vein from the anastomosis to the mid upper bicipital region. The patient had a palpable radial pulse. He had an excellent doppler signal in the fistula. The incision was irrigated and hemostasis achieved with cautery and suture. The deeper  tissue was closed with 3-0 Vicryl and the skin closed with 4-0 Monocryl.  Dermabond was applied to the incisions. The patient was transferred to PACU in stable condition.   Given the complexity of the case a first assistant was necessary in order to expedient the procedure and safely perform the technical aspects of the operation.  Macie Burows, MD [facility]  DATE OF DICTATION:   05/17/2021

## 2021-05-17 NOTE — Progress Notes (Signed)
Orthopedic Tech Progress Note Patient Details:  Robert Lucas 1940/07/09 [id number]  PACU RN called requesting an ARM SLING for patient   Ortho Devices Type of Ortho Device: Arm sling Ortho Device/Splint Location: LUE Ortho Device/Splint Interventions: Ordered   Post Interventions Patient Tolerated: Well Instructions Provided: Care of [place] 05/17/2021, 9:40 AM

## 2021-05-17 NOTE — Anesthesia Postprocedure Evaluation (Signed)
Anesthesia Post Note  Patient: Robert Lucas  Procedure(s) Performed: LEFT ARM ARTERIOVENOUS (AV) FISTULA CREATION (Left: Arm Lower)     Patient location during evaluation: PACU Anesthesia Type: Regional Level of consciousness: awake Pain management: pain level controlled Vital Signs Assessment: post-procedure vital signs reviewed and stable Respiratory status: spontaneous breathing, nonlabored ventilation, respiratory function stable and patient connected to nasal cannula oxygen Cardiovascular status: stable and blood pressure returned to baseline Postop Assessment: no apparent nausea or vomiting Anesthetic complications: no   No notable events documented.  Last Vitals:  Vitals:   05/17/21 0900 05/17/21 0915  BP: 121/63 135/63  Pulse: 60 (!) 59  Resp: 16 15  Temp: (!) 36.3 C (!) 36.4 C  SpO2: 99% 100%    Last Pain:  Vitals:   05/17/21 0915  TempSrc:   PainSc: 0-No pain                 Robert Lucas

## 2021-05-17 NOTE — Anesthesia Procedure Notes (Signed)
Anesthesia Regional Block: Supraclavicular block   Pre-Anesthetic Checklist: , timeout performed,  Correct Patient, Correct Site, Correct Laterality,  Correct Procedure, Correct Position, site marked,  Risks and benefits discussed,  Surgical consent,  Pre-op evaluation,  At surgeon's request and post-op pain management  Laterality: Left  Prep: chloraprep       Needles:  Injection technique: Single-shot  Needle Type: Echogenic Stimulator Needle     Needle Length: 9cm  Needle Gauge: 21     Additional Needles:   Procedures:,,,, ultrasound used (permanent image in chart),,    Narrative:  Start time: 05/17/2021 7:05 AM End time: 05/17/2021 7:15 AM Injection made incrementally with aspirations every 5 mL.  Performed by: Personally  Anesthesiologist: Murvin Natal, MD  Additional Notes: Functioning IV was confirmed and monitors were applied.  A timeout was performed. Sterile prep, hand hygiene and sterile gloves were used. A 79mm 21ga Arrow echogenic stimulator needle was used. Negative aspiration and negative test dose prior to incremental administration of local anesthetic. The patient tolerated the procedure well.  Ultrasound guidance: relevent anatomy identified, needle position confirmed, local anesthetic spread visualized around nerve(s), vascular puncture avoided.  Image printed for medical record.

## 2021-05-17 NOTE — Transfer of Care (Signed)
Immediate Anesthesia Transfer of Care Note  Patient: Robert Lucas  Procedure(s) Performed: LEFT ARM ARTERIOVENOUS (AV) FISTULA CREATION (Left: Arm Lower)  Patient Location: PACU  Anesthesia Type:MAC combined with regional for post-op pain  Level of Consciousness: drowsy, patient cooperative and responds to stimulation  Airway & Oxygen Therapy: Patient Spontanous Breathing  Post-op Assessment: Report given to RN and Post -op Vital signs reviewed and stable  Post vital signs: Reviewed and stable  Last Vitals:  Vitals Value Taken Time  BP    Temp    Pulse    Resp    SpO2      Last Pain:  Vitals:   05/17/21 0615  TempSrc: Oral  PainSc:          Complications: No notable events documented.

## 2021-05-17 NOTE — Discharge Instructions (Signed)
° °  [facility] ° °Discharge Instructions ° °AV Fistula or Graft Surgery for Dialysis Access ° °Please refer to the following instructions for your post-procedure care. Your surgeon or physician assistant will discuss any changes with you. ° °Activity ° °You may drive the day following your surgery, if you are comfortable and no longer taking prescription pain medication. Resume full activity as the soreness in your incision resolves. ° °Bathing/Showering ° °You may shower after you go home. Keep your incision dry for 48 hours. Do not soak in a bathtub, hot tub, or swim until the incision heals completely. You may not shower if you have a hemodialysis catheter. ° °Incision Care ° °Clean your incision with mild soap and water after 48 hours. Pat the area dry with a clean towel. You do not need a bandage unless otherwise instructed. Do not apply any ointments or creams to your incision. You may have skin glue on your incision. Do not peel it off. It will come off on its own in about one week. Your arm may swell a bit after surgery. To reduce swelling use pillows to elevate your arm so it is above your heart. Your doctor will tell you if you need to lightly wrap your arm with an ACE bandage. ° °Diet ° °Resume your normal diet. There are not special food restrictions following this procedure. In order to heal from your surgery, it is CRITICAL to get adequate nutrition. Your body requires vitamins, minerals, and protein. Vegetables are the best source of vitamins and minerals. Vegetables also provide the perfect balance of protein. Processed food has little nutritional value, so try to avoid this. ° °Medications ° °Resume taking all of your medications. If your incision is causing pain, you may take over-the counter pain relievers such as acetaminophen (Tylenol). If you were prescribed a stronger pain medication, please be aware these medications can cause nausea and constipation. Prevent  nausea by taking the medication with a snack or meal. Avoid constipation by drinking plenty of fluids and eating foods with high amount of fiber, such as fruits, vegetables, and grains. Do not take Tylenol if you are taking prescription pain medications. ° ° ° ° °Follow up °Your surgeon may want to see you in the office following your access surgery. If so, this will be arranged at the time of your surgery. ° °Please call us immediately for any of the following conditions: ° °Increased pain, redness, drainage (pus) from your incision site °Fever of 101 degrees or higher °Severe or worsening pain at your incision site °Hand pain or numbness. ° °Reduce your risk of vascular disease: ° °Stop smoking. If you would like help, call [facility] at 1-800-QUIT-NOW (1-800-784-8669) or [place] at 336-586-4000 ° °Manage your cholesterol °Maintain a desired weight °Control your diabetes °Keep your blood pressure down ° °Dialysis ° °It will take several weeks to several months for your new dialysis access to be ready for use. Your surgeon will determine when it is OK to use it. Your nephrologist will continue to direct your dialysis. You can continue to use your Permcath until your new access is ready for use. ° °If you have any questions, please call the office at [phone]. ° °

## 2021-05-18 ENCOUNTER — Encounter (HOSPITAL_COMMUNITY): Payer: Self-pay | Admitting: Vascular Surgery

## 2021-06-18 ENCOUNTER — Other Ambulatory Visit: Payer: Self-pay

## 2021-06-18 DIAGNOSIS — N184 Chronic kidney disease, stage 4 (severe): Secondary | ICD-10-CM

## 2021-07-01 ENCOUNTER — Ambulatory Visit (INDEPENDENT_AMBULATORY_CARE_PROVIDER_SITE_OTHER): Payer: Medicare Other | Admitting: Physician Assistant

## 2021-07-01 ENCOUNTER — Encounter: Payer: Self-pay | Admitting: Physician Assistant

## 2021-07-01 ENCOUNTER — Encounter (HOSPITAL_COMMUNITY): Payer: Medicare Other

## 2021-07-01 ENCOUNTER — Ambulatory Visit (HOSPITAL_COMMUNITY)
Admission: RE | Admit: 2021-07-01 | Discharge: 2021-07-01 | Disposition: A | Discharge status: Home / Self Care | Payer: Medicare Other | Source: Ambulatory Visit | Attending: Physician Assistant | Admitting: Physician Assistant

## 2021-07-01 ENCOUNTER — Other Ambulatory Visit: Payer: Self-pay

## 2021-07-01 VITALS — BP 135/56 | HR 72 | Temp 98.0°F | Resp 20 | Ht 63.0 in | Wt 161.4 lb

## 2021-07-01 DIAGNOSIS — H2513 Age-related nuclear cataract, bilateral: Secondary | ICD-10-CM | POA: Insufficient documentation

## 2021-07-01 DIAGNOSIS — I119 Hypertensive heart disease without heart failure: Secondary | ICD-10-CM | POA: Insufficient documentation

## 2021-07-01 DIAGNOSIS — K8000 Calculus of gallbladder with acute cholecystitis without obstruction: Secondary | ICD-10-CM | POA: Insufficient documentation

## 2021-07-01 DIAGNOSIS — J301 Allergic rhinitis due to pollen: Secondary | ICD-10-CM | POA: Insufficient documentation

## 2021-07-01 DIAGNOSIS — Z9861 Coronary angioplasty status: Secondary | ICD-10-CM | POA: Insufficient documentation

## 2021-07-01 DIAGNOSIS — N184 Chronic kidney disease, stage 4 (severe): Secondary | ICD-10-CM | POA: Insufficient documentation

## 2021-07-01 DIAGNOSIS — Z951 Presence of aortocoronary bypass graft: Secondary | ICD-10-CM | POA: Insufficient documentation

## 2021-07-01 DIAGNOSIS — N400 Enlarged prostate without lower urinary tract symptoms: Secondary | ICD-10-CM | POA: Insufficient documentation

## 2021-07-01 DIAGNOSIS — J309 Allergic rhinitis, unspecified: Secondary | ICD-10-CM | POA: Insufficient documentation

## 2021-07-01 DIAGNOSIS — D692 Other nonthrombocytopenic purpura: Secondary | ICD-10-CM | POA: Insufficient documentation

## 2021-07-01 DIAGNOSIS — E559 Vitamin D deficiency, unspecified: Secondary | ICD-10-CM | POA: Insufficient documentation

## 2021-07-01 DIAGNOSIS — B0229 Other postherpetic nervous system involvement: Secondary | ICD-10-CM | POA: Insufficient documentation

## 2021-07-01 DIAGNOSIS — K5900 Constipation, unspecified: Secondary | ICD-10-CM | POA: Insufficient documentation

## 2021-07-01 DIAGNOSIS — K8020 Calculus of gallbladder without cholecystitis without obstruction: Secondary | ICD-10-CM | POA: Insufficient documentation

## 2021-07-01 DIAGNOSIS — N401 Enlarged prostate with lower urinary tract symptoms: Secondary | ICD-10-CM | POA: Insufficient documentation

## 2021-07-01 DIAGNOSIS — G4733 Obstructive sleep apnea (adult) (pediatric): Secondary | ICD-10-CM | POA: Insufficient documentation

## 2021-07-01 DIAGNOSIS — Z7189 Other specified counseling: Secondary | ICD-10-CM | POA: Insufficient documentation

## 2021-07-01 DIAGNOSIS — E1122 Type 2 diabetes mellitus with diabetic chronic kidney disease: Secondary | ICD-10-CM | POA: Insufficient documentation

## 2021-07-01 DIAGNOSIS — E113291 Type 2 diabetes mellitus with mild nonproliferative diabetic retinopathy without macular edema, right eye: Secondary | ICD-10-CM | POA: Insufficient documentation

## 2021-07-01 DIAGNOSIS — I252 Old myocardial infarction: Secondary | ICD-10-CM | POA: Insufficient documentation

## 2021-07-01 DIAGNOSIS — E1121 Type 2 diabetes mellitus with diabetic nephropathy: Secondary | ICD-10-CM | POA: Insufficient documentation

## 2021-07-01 DIAGNOSIS — M129 Arthropathy, unspecified: Secondary | ICD-10-CM | POA: Insufficient documentation

## 2021-07-01 DIAGNOSIS — R809 Proteinuria, unspecified: Secondary | ICD-10-CM | POA: Insufficient documentation

## 2021-07-01 NOTE — Progress Notes (Signed)
POST OPERATIVE OFFICE NOTE    CC:  F/u for surgery  HPI:  This is a 81 y.o. male who has CKD not requiring HD.  He is s/p LEFT brachiocephalic fistula creation  on 05/17/21  by Dr. Virl Cagey.    Pt returns today for follow up.  Pt states he has had no pian, loss of motor or loss of sensation in the left UE.  He denise fever or chills.    Allergies  Allergen Reactions   Lisinopril Cough   Hydrocodone Nausea Only   Liraglutide     GI upset   Prednisone     Elevates BGL; patient prefers to not take this   Sitagliptin     Unknown   Vardenafil     Other reaction(s): flushing   Penicillins Rash and Other (See Comments)    Rash around ankles Has patient had a PCN reaction causing immediate rash, facial/tongue/throat swelling, SOB or lightheadedness with hypotension: Yes Has patient had a PCN reaction causing severe rash involving mucus membranes or skin necrosis: Unk Has patient had a PCN reaction that required hospitalization: No Has patient had a PCN reaction occurring within the last 10 years: No If all of the above answers are "NO", then may proceed with Cephalosporin use.     Current Outpatient Medications  Medication Sig Dispense Refill   acetaminophen (TYLENOL) 500 MG tablet Take 500 mg by mouth every 6 (six) hours as needed for moderate pain or headache.     allopurinol (ZYLOPRIM) 100 MG tablet Take 100 mg by mouth daily.     amLODipine (NORVASC) 2.5 MG tablet Take 2.5 mg by mouth daily.     Aromatic Inhalants (VICKS VAPOINHALER IN) Inhale 1 puff into the lungs daily as needed (congestion).     aspirin EC 81 MG tablet Take 81 mg by mouth every evening.     atorvastatin (LIPITOR) 40 MG tablet Take 40 mg by mouth at bedtime.     calcitRIOL (ROCALTROL) 0.25 MCG capsule Take 0.25 mcg by mouth every Monday, Wednesday, and Friday.     finasteride (PROSCAR) 5 MG tablet Take 5 mg by mouth every evening.     fluticasone (FLONASE) 50 MCG/ACT nasal spray Place 1 spray into both  nostrils daily as needed for allergies or rhinitis.     furosemide (LASIX) 40 MG tablet TAKE 1 TABLET BY MOUTH  DAILY AS NEEDED FOR EDEMA (Patient taking differently: Take 40 mg by mouth daily.) 90 tablet 3   hydrALAZINE (APRESOLINE) 50 MG tablet Take 1 tablet (50 mg total) by mouth in the morning and at bedtime. (Patient not taking: No sig reported) 180 tablet 3   insulin aspart protamine - aspart (NOVOLOG MIX 70/30 FLEXPEN) (70-30) 100 UNIT/ML FlexPen Inject 14-20 Units into the skin daily as needed (high blood sugar). If BS is <180=0 units, If BS is 180-200 units=14 units, If BS is >201=20 units     isosorbide dinitrate (ISORDIL) 20 MG tablet Take 1 tablet (20 mg total) by mouth 2 (two) times daily. 180 tablet 3   losartan (COZAAR) 25 MG tablet Take 1 tablet (25 mg total) by mouth daily after supper. 30 tablet 0   metoprolol succinate (TOPROL-XL) 50 MG 24 hr tablet Take 1 tablet (50 mg total) by mouth daily. Take with or immediately following a meal. 30 tablet 0   tamsulosin (FLOMAX) 0.4 MG CAPS capsule Take 1 capsule (0.4 mg total) by mouth daily after supper. (Patient taking differently: Take 0.4 mg by mouth  at bedtime.) 30 capsule 0   traMADol (ULTRAM) 50 MG tablet Take 1 tablet (50 mg total) by mouth every 6 (six) hours as needed. 15 tablet 0   No current facility-administered medications for this visit.     ROS:  See HPI  Physical Exam:  Findings:  +--------------------+----------+-----------------+--------+   AVF                  PSV (cm/s) Flow Vol (mL/min) Comments   +--------------------+----------+-----------------+--------+   Native artery inflow    137           1002                   +--------------------+----------+-----------------+--------+   AVF Anastomosis         391                                  +--------------------+----------+-----------------+--------+      +------------+----------+-------------+----------+----------------+   OUTFLOW VEIN PSV (cm/s) Diameter  (cm) Depth (cm)     Describe       +------------+----------+-------------+----------+----------------+   Shoulder        184         0.37         0.51                       +------------+----------+-------------+----------+----------------+   Prox UA         234         0.39         0.46                       +------------+----------+-------------+----------+----------------+   Mid UA          221         0.35         0.35                       +------------+----------+-------------+----------+----------------+   Dist UA         295         0.39         0.46    competing branch   +------------+----------+-------------+----------+----------------+   AC Fossa        404         0.39         0.32                       +------------+----------+-------------+----------+----------------+          Summary:  Patent left arteriovenous fistula.   Incision:  well healed Extremities:  palpable thrill, palpable radial pulse with intact motor and sensation. Lungs: non labored breathing    Assessment/Plan:  This is a 80 y.o. male who is s/p:Pre-op his veins in B UE were marginal..  He is s/p LEFT brachiocephalic fistula.  The fistula duplex shows good depth< 0.6 cm, but the diameter of the fistula is small still< 0.4 cm.  He is not on HD at this time.  I will ask him to continue with activity to exercise the left UE in hopes of the fistula diameter enlarging.  He will f/u in 4 weeks for repeat duplex.  If the vein fails to mature he may need to consider a graft pending his need for HD.   Robert Lucas  Gerri Lins PA-C [facility] [phone]   Clinic MD:  Virl Cagey

## 2021-07-05 ENCOUNTER — Other Ambulatory Visit: Payer: Self-pay

## 2021-07-05 DIAGNOSIS — N184 Chronic kidney disease, stage 4 (severe): Secondary | ICD-10-CM

## 2021-07-15 DIAGNOSIS — E872 Acidosis, unspecified: Secondary | ICD-10-CM | POA: Diagnosis not present

## 2021-07-15 DIAGNOSIS — E1122 Type 2 diabetes mellitus with diabetic chronic kidney disease: Secondary | ICD-10-CM | POA: Diagnosis not present

## 2021-07-15 DIAGNOSIS — I5020 Unspecified systolic (congestive) heart failure: Secondary | ICD-10-CM | POA: Diagnosis not present

## 2021-07-15 DIAGNOSIS — I120 Hypertensive chronic kidney disease with stage 5 chronic kidney disease or end stage renal disease: Secondary | ICD-10-CM | POA: Diagnosis not present

## 2021-07-15 DIAGNOSIS — D631 Anemia in chronic kidney disease: Secondary | ICD-10-CM | POA: Diagnosis not present

## 2021-07-15 DIAGNOSIS — I2510 Atherosclerotic heart disease of native coronary artery without angina pectoris: Secondary | ICD-10-CM | POA: Diagnosis not present

## 2021-07-15 DIAGNOSIS — N185 Chronic kidney disease, stage 5: Secondary | ICD-10-CM | POA: Diagnosis not present

## 2021-07-15 DIAGNOSIS — I429 Cardiomyopathy, unspecified: Secondary | ICD-10-CM | POA: Diagnosis not present

## 2021-07-26 DIAGNOSIS — N185 Chronic kidney disease, stage 5: Secondary | ICD-10-CM | POA: Diagnosis not present

## 2021-08-05 ENCOUNTER — Ambulatory Visit (HOSPITAL_COMMUNITY)
Admission: RE | Admit: 2021-08-05 | Discharge: 2021-08-05 | Disposition: A | Discharge status: Home / Self Care | Payer: Medicare Other | Source: Ambulatory Visit | Attending: Vascular Surgery | Admitting: Vascular Surgery

## 2021-08-05 ENCOUNTER — Ambulatory Visit: Payer: Medicare Other | Admitting: Physician Assistant

## 2021-08-05 ENCOUNTER — Other Ambulatory Visit: Payer: Self-pay

## 2021-08-05 VITALS — BP 140/59 | HR 70 | Temp 97.9°F | Resp 20 | Ht 63.0 in | Wt 159.5 lb

## 2021-08-05 DIAGNOSIS — N184 Chronic kidney disease, stage 4 (severe): Secondary | ICD-10-CM

## 2021-08-05 NOTE — Progress Notes (Signed)
POST OPERATIVE OFFICE NOTE    CC:  F/u for surgery  HPI:  This is a 81 y.o. male who is s/p He is s/p LEFT brachiocephalic fistula creation  on 05/17/21  by Dr. Virl Cagey.   He is not on HD at this time.   He denies symptoms of steal.  He is followed by Dr. Joelyn Oms.    Pt returns today for follow up to check for fistula maturity.  Allergies  Allergen Reactions   Lisinopril Cough   Hydrocodone Nausea Only   Liraglutide     GI upset   Prednisone     Elevates BGL; patient prefers to not take this   Sitagliptin     Unknown   Vardenafil     Other reaction(s): flushing   Penicillins Rash and Other (See Comments)    Rash around ankles Has patient had a PCN reaction causing immediate rash, facial/tongue/throat swelling, SOB or lightheadedness with hypotension: Yes Has patient had a PCN reaction causing severe rash involving mucus membranes or skin necrosis: Unk Has patient had a PCN reaction that required hospitalization: No Has patient had a PCN reaction occurring within the last 10 years: No If all of the above answers are "NO", then may proceed with Cephalosporin use.     Current Outpatient Medications  Medication Sig Dispense Refill   acetaminophen (TYLENOL) 500 MG tablet Take 500 mg by mouth every 6 (six) hours as needed for moderate pain or headache.     allopurinol (ZYLOPRIM) 100 MG tablet Take 100 mg by mouth daily.     amLODipine (NORVASC) 2.5 MG tablet Take 2.5 mg by mouth daily.     Aromatic Inhalants (VICKS VAPOINHALER IN) Inhale 1 puff into the lungs daily as needed (congestion).     aspirin EC 81 MG tablet Take 81 mg by mouth every evening.     atorvastatin (LIPITOR) 40 MG tablet Take 40 mg by mouth at bedtime.     calcitRIOL (ROCALTROL) 0.25 MCG capsule Take 0.25 mcg by mouth every Monday, Wednesday, and Friday.     finasteride (PROSCAR) 5 MG tablet Take 5 mg by mouth every evening.     fluticasone (FLONASE) 50 MCG/ACT nasal spray Place 1 spray into both nostrils daily  as needed for allergies or rhinitis.     furosemide (LASIX) 40 MG tablet TAKE 1 TABLET BY MOUTH  DAILY AS NEEDED FOR EDEMA (Patient taking differently: Take 40 mg by mouth daily.) 90 tablet 3   hydrALAZINE (APRESOLINE) 50 MG tablet Take 1 tablet (50 mg total) by mouth in the morning and at bedtime. 180 tablet 3   insulin aspart protamine - aspart (NOVOLOG MIX 70/30 FLEXPEN) (70-30) 100 UNIT/ML FlexPen Inject 14-20 Units into the skin daily as needed (high blood sugar). If BS is <180=0 units, If BS is 180-200 units=14 units, If BS is >201=20 units     isosorbide dinitrate (ISORDIL) 20 MG tablet Take 1 tablet (20 mg total) by mouth 2 (two) times daily. 180 tablet 3   metoprolol succinate (TOPROL-XL) 50 MG 24 hr tablet Take 1 tablet (50 mg total) by mouth daily. Take with or immediately following a meal. 30 tablet 0   tamsulosin (FLOMAX) 0.4 MG CAPS capsule Take 1 capsule (0.4 mg total) by mouth daily after supper. (Patient taking differently: Take 0.4 mg by mouth at bedtime.) 30 capsule 0   traMADol (ULTRAM) 50 MG tablet Take 1 tablet (50 mg total) by mouth every 6 (six) hours as needed. 15 tablet 0  losartan (COZAAR) 25 MG tablet Take 1 tablet (25 mg total) by mouth daily after supper. 30 tablet 0   No current facility-administered medications for this visit.     ROS:  See HPI  Physical Exam:    Findings:  +--------------------+----------+-----------------+--------+   AVF                  PSV (cm/s) Flow Vol (mL/min) Comments   +--------------------+----------+-----------------+--------+   Native artery inflow    156            718                   +--------------------+----------+-----------------+--------+   AVF Anastomosis         637                                  +--------------------+----------+-----------------+--------+      +------------+----------+-------------+----------+-------------+   OUTFLOW VEIN PSV (cm/s) Diameter (cm) Depth (cm)   Describe       +------------+----------+-------------+----------+-------------+   Prox UA         169         0.43         0.49                    +------------+----------+-------------+----------+-------------+   Mid UA          205         0.35         0.38                    +------------+----------+-------------+----------+-------------+   Dist UA         288         0.45         0.35    branch 0.3 cm   +------------+----------+-------------+----------+-------------+   AC Fossa        429         0.44                                 +------------+----------+-------------+----------+-------------+      Summary:  Patent left brachio-cephalic AVF.  No stenosis.  Branch in the distal upper arm 0.3cm.  Largest outflow diameter of 0.45cm.       Incision:  well healed Extremities:  palpable thrill and radial pulse left UE Left UE N/V/M intact   Assessment/Plan:  This is a 81 y.o. male who is s/p: Left brachial cephalic av fistula  The fistula has increased in diameter since his last visit.  However it is not > 0.6 cm.  He will continue to exercise the left hand to promote increased blood flow and assist with fistula maturity.  On the duplex the anastomosis has some elevated velocities.  He may benefit from fistulogram  with intervention verses graft placement.  He will f/u PRN.  Hopefully the fistula will continue to slowly mature with time and no intervention will be needed.    If his Nephrologist Dr. Joelyn Oms thinks he is getting close to needing HD he can send him back for f/u and we can proceed with further intervention verses surgery to place an AV graft.       Roxy Horseman PA-C [facility] [phone]   Clinic MD:  Virl Cagey

## 2021-08-10 ENCOUNTER — Other Ambulatory Visit: Payer: Self-pay

## 2021-08-10 ENCOUNTER — Ambulatory Visit (HOSPITAL_COMMUNITY)
Admission: RE | Admit: 2021-08-10 | Discharge: 2021-08-10 | Disposition: A | Discharge status: Home / Self Care | Payer: Medicare Other | Source: Ambulatory Visit | Attending: Nephrology | Admitting: Nephrology

## 2021-08-10 VITALS — BP 137/76 | HR 70 | Temp 97.3°F | Resp 20

## 2021-08-10 DIAGNOSIS — E1122 Type 2 diabetes mellitus with diabetic chronic kidney disease: Secondary | ICD-10-CM | POA: Diagnosis not present

## 2021-08-10 DIAGNOSIS — N183 Chronic kidney disease, stage 3 unspecified: Secondary | ICD-10-CM | POA: Insufficient documentation

## 2021-08-10 LAB — POCT HEMOGLOBIN-HEMACUE: Hemoglobin: 8.4 g/dL — ABNORMAL LOW (ref 13.0–17.0)

## 2021-08-10 MED ORDER — EPOETIN ALFA-EPBX 10000 UNIT/ML IJ SOLN
20000.0000 [IU] | INTRAMUSCULAR | Status: DC
  Administered 2021-08-10: 20000 [IU] via SUBCUTANEOUS

## 2021-08-10 MED ORDER — EPOETIN ALFA-EPBX 10000 UNIT/ML IJ SOLN
INTRAMUSCULAR | Status: AC
  Filled 2021-08-10: qty 2

## 2021-08-12 ENCOUNTER — Other Ambulatory Visit: Payer: Self-pay | Admitting: Cardiology

## 2021-08-12 DIAGNOSIS — I5020 Unspecified systolic (congestive) heart failure: Secondary | ICD-10-CM

## 2021-08-20 DIAGNOSIS — R0602 Shortness of breath: Secondary | ICD-10-CM | POA: Diagnosis not present

## 2021-08-20 DIAGNOSIS — J069 Acute upper respiratory infection, unspecified: Secondary | ICD-10-CM | POA: Diagnosis not present

## 2021-08-25 ENCOUNTER — Telehealth: Payer: Self-pay

## 2021-08-25 ENCOUNTER — Encounter (HOSPITAL_BASED_OUTPATIENT_CLINIC_OR_DEPARTMENT_OTHER): Payer: Self-pay

## 2021-08-25 ENCOUNTER — Other Ambulatory Visit: Payer: Self-pay

## 2021-08-25 ENCOUNTER — Inpatient Hospital Stay (HOSPITAL_BASED_OUTPATIENT_CLINIC_OR_DEPARTMENT_OTHER)
Admission: EM | Admit: 2021-08-25 | Discharge: 2021-08-30 | DRG: 291 | Disposition: A | Discharge status: Home / Self Care | Payer: Medicare Other | Attending: Internal Medicine | Admitting: Internal Medicine

## 2021-08-25 ENCOUNTER — Emergency Department (HOSPITAL_BASED_OUTPATIENT_CLINIC_OR_DEPARTMENT_OTHER): Payer: Medicare Other | Admitting: Radiology

## 2021-08-25 DIAGNOSIS — M109 Gout, unspecified: Secondary | ICD-10-CM | POA: Diagnosis present

## 2021-08-25 DIAGNOSIS — I10 Essential (primary) hypertension: Secondary | ICD-10-CM | POA: Diagnosis present

## 2021-08-25 DIAGNOSIS — N250 Renal osteodystrophy: Secondary | ICD-10-CM | POA: Diagnosis not present

## 2021-08-25 DIAGNOSIS — E876 Hypokalemia: Secondary | ICD-10-CM | POA: Diagnosis not present

## 2021-08-25 DIAGNOSIS — Z87891 Personal history of nicotine dependence: Secondary | ICD-10-CM

## 2021-08-25 DIAGNOSIS — E1122 Type 2 diabetes mellitus with diabetic chronic kidney disease: Secondary | ICD-10-CM | POA: Diagnosis not present

## 2021-08-25 DIAGNOSIS — J90 Pleural effusion, not elsewhere classified: Secondary | ICD-10-CM | POA: Diagnosis not present

## 2021-08-25 DIAGNOSIS — I5023 Acute on chronic systolic (congestive) heart failure: Secondary | ICD-10-CM | POA: Diagnosis present

## 2021-08-25 DIAGNOSIS — Z885 Allergy status to narcotic agent status: Secondary | ICD-10-CM | POA: Diagnosis not present

## 2021-08-25 DIAGNOSIS — Z79899 Other long term (current) drug therapy: Secondary | ICD-10-CM

## 2021-08-25 DIAGNOSIS — E872 Acidosis, unspecified: Secondary | ICD-10-CM | POA: Diagnosis not present

## 2021-08-25 DIAGNOSIS — G4733 Obstructive sleep apnea (adult) (pediatric): Secondary | ICD-10-CM | POA: Diagnosis not present

## 2021-08-25 DIAGNOSIS — N186 End stage renal disease: Secondary | ICD-10-CM

## 2021-08-25 DIAGNOSIS — M1990 Unspecified osteoarthritis, unspecified site: Secondary | ICD-10-CM | POA: Diagnosis present

## 2021-08-25 DIAGNOSIS — Z888 Allergy status to other drugs, medicaments and biological substances status: Secondary | ICD-10-CM

## 2021-08-25 DIAGNOSIS — M898X9 Other specified disorders of bone, unspecified site: Secondary | ICD-10-CM | POA: Diagnosis present

## 2021-08-25 DIAGNOSIS — N185 Chronic kidney disease, stage 5: Secondary | ICD-10-CM | POA: Diagnosis present

## 2021-08-25 DIAGNOSIS — R778 Other specified abnormalities of plasma proteins: Secondary | ICD-10-CM | POA: Diagnosis not present

## 2021-08-25 DIAGNOSIS — I5042 Chronic combined systolic (congestive) and diastolic (congestive) heart failure: Secondary | ICD-10-CM | POA: Diagnosis present

## 2021-08-25 DIAGNOSIS — E1129 Type 2 diabetes mellitus with other diabetic kidney complication: Secondary | ICD-10-CM | POA: Diagnosis not present

## 2021-08-25 DIAGNOSIS — I517 Cardiomegaly: Secondary | ICD-10-CM | POA: Diagnosis not present

## 2021-08-25 DIAGNOSIS — I120 Hypertensive chronic kidney disease with stage 5 chronic kidney disease or end stage renal disease: Secondary | ICD-10-CM | POA: Diagnosis not present

## 2021-08-25 DIAGNOSIS — I2510 Atherosclerotic heart disease of native coronary artery without angina pectoris: Secondary | ICD-10-CM | POA: Diagnosis not present

## 2021-08-25 DIAGNOSIS — I132 Hypertensive heart and chronic kidney disease with heart failure and with stage 5 chronic kidney disease, or end stage renal disease: Principal | ICD-10-CM | POA: Diagnosis present

## 2021-08-25 DIAGNOSIS — E7800 Pure hypercholesterolemia, unspecified: Secondary | ICD-10-CM | POA: Diagnosis not present

## 2021-08-25 DIAGNOSIS — Z794 Long term (current) use of insulin: Secondary | ICD-10-CM

## 2021-08-25 DIAGNOSIS — Z96641 Presence of right artificial hip joint: Secondary | ICD-10-CM | POA: Diagnosis present

## 2021-08-25 DIAGNOSIS — Z880 Allergy status to penicillin: Secondary | ICD-10-CM

## 2021-08-25 DIAGNOSIS — E8770 Fluid overload, unspecified: Secondary | ICD-10-CM

## 2021-08-25 DIAGNOSIS — N400 Enlarged prostate without lower urinary tract symptoms: Secondary | ICD-10-CM | POA: Diagnosis present

## 2021-08-25 DIAGNOSIS — I509 Heart failure, unspecified: Secondary | ICD-10-CM | POA: Diagnosis present

## 2021-08-25 DIAGNOSIS — Z79891 Long term (current) use of opiate analgesic: Secondary | ICD-10-CM

## 2021-08-25 DIAGNOSIS — I5043 Acute on chronic combined systolic (congestive) and diastolic (congestive) heart failure: Secondary | ICD-10-CM | POA: Diagnosis not present

## 2021-08-25 DIAGNOSIS — N179 Acute kidney failure, unspecified: Secondary | ICD-10-CM | POA: Diagnosis not present

## 2021-08-25 DIAGNOSIS — D631 Anemia in chronic kidney disease: Secondary | ICD-10-CM | POA: Diagnosis present

## 2021-08-25 DIAGNOSIS — Z9049 Acquired absence of other specified parts of digestive tract: Secondary | ICD-10-CM

## 2021-08-25 DIAGNOSIS — N189 Chronic kidney disease, unspecified: Secondary | ICD-10-CM | POA: Diagnosis present

## 2021-08-25 DIAGNOSIS — E119 Type 2 diabetes mellitus without complications: Secondary | ICD-10-CM | POA: Diagnosis present

## 2021-08-25 DIAGNOSIS — Z992 Dependence on renal dialysis: Secondary | ICD-10-CM | POA: Diagnosis not present

## 2021-08-25 DIAGNOSIS — Z7982 Long term (current) use of aspirin: Secondary | ICD-10-CM | POA: Diagnosis not present

## 2021-08-25 DIAGNOSIS — I252 Old myocardial infarction: Secondary | ICD-10-CM | POA: Diagnosis not present

## 2021-08-25 DIAGNOSIS — E1169 Type 2 diabetes mellitus with other specified complication: Secondary | ICD-10-CM | POA: Diagnosis not present

## 2021-08-25 DIAGNOSIS — Z20822 Contact with and (suspected) exposure to covid-19: Secondary | ICD-10-CM | POA: Diagnosis not present

## 2021-08-25 DIAGNOSIS — R0602 Shortness of breath: Secondary | ICD-10-CM | POA: Diagnosis not present

## 2021-08-25 DIAGNOSIS — Z951 Presence of aortocoronary bypass graft: Secondary | ICD-10-CM | POA: Diagnosis not present

## 2021-08-25 DIAGNOSIS — N184 Chronic kidney disease, stage 4 (severe): Secondary | ICD-10-CM | POA: Diagnosis not present

## 2021-08-25 DIAGNOSIS — E785 Hyperlipidemia, unspecified: Secondary | ICD-10-CM

## 2021-08-25 LAB — BASIC METABOLIC PANEL
Anion gap: 13 (ref 5–15)
BUN: 55 mg/dL — ABNORMAL HIGH (ref 8–23)
CO2: 19 mmol/L — ABNORMAL LOW (ref 22–32)
Calcium: 8.3 mg/dL — ABNORMAL LOW (ref 8.9–10.3)
Chloride: 109 mmol/L (ref 98–111)
Creatinine, Ser: 4.53 mg/dL — ABNORMAL HIGH (ref 0.61–1.24)
GFR, Estimated: 12 mL/min — ABNORMAL LOW (ref >60)
Glucose, Bld: 182 mg/dL — ABNORMAL HIGH (ref 70–99)
Potassium: 3.8 mmol/L (ref 3.5–5.1)
Sodium: 141 mmol/L (ref 135–145)

## 2021-08-25 LAB — CBC WITH DIFFERENTIAL/PLATELET
Abs Immature Granulocytes: 0.03 10*3/uL (ref 0.00–0.07)
Basophils Absolute: 0 10*3/uL (ref 0.0–0.1)
Basophils Relative: 1 %
Eosinophils Absolute: 0.2 10*3/uL (ref 0.0–0.5)
Eosinophils Relative: 3 %
HCT: 27 % — ABNORMAL LOW (ref 39.0–52.0)
Hemoglobin: 8.7 g/dL — ABNORMAL LOW (ref 13.0–17.0)
Immature Granulocytes: 1 %
Lymphocytes Relative: 9 %
Lymphs Abs: 0.5 10*3/uL — ABNORMAL LOW (ref 0.7–4.0)
MCH: 31 pg (ref 26.0–34.0)
MCHC: 32.2 g/dL (ref 30.0–36.0)
MCV: 96.1 fL (ref 80.0–100.0)
Monocytes Absolute: 0.5 10*3/uL (ref 0.1–1.0)
Monocytes Relative: 9 %
Neutro Abs: 4.5 10*3/uL (ref 1.7–7.7)
Neutrophils Relative %: 77 %
Platelets: 163 10*3/uL (ref 150–400)
RBC: 2.81 MIL/uL — ABNORMAL LOW (ref 4.22–5.81)
RDW: 15.6 % — ABNORMAL HIGH (ref 11.5–15.5)
WBC: 5.8 10*3/uL (ref 4.0–10.5)
nRBC: 0 % (ref 0.0–0.2)

## 2021-08-25 LAB — RESP PANEL BY RT-PCR (FLU A&B, COVID) ARPGX2
Influenza A by PCR: NEGATIVE
Influenza B by PCR: NEGATIVE
SARS Coronavirus 2 by RT PCR: NEGATIVE

## 2021-08-25 LAB — BRAIN NATRIURETIC PEPTIDE: B Natriuretic Peptide: 1966.1 pg/mL — ABNORMAL HIGH (ref 0.0–100.0)

## 2021-08-25 LAB — MRSA NEXT GEN BY PCR, NASAL: MRSA by PCR Next Gen: NOT DETECTED

## 2021-08-25 LAB — TROPONIN I (HIGH SENSITIVITY)
Troponin I (High Sensitivity): 42 ng/L — ABNORMAL HIGH (ref <18)
Troponin I (High Sensitivity): 43 ng/L — ABNORMAL HIGH (ref <18)

## 2021-08-25 LAB — GLUCOSE, CAPILLARY: Glucose-Capillary: 231 mg/dL — ABNORMAL HIGH (ref 70–99)

## 2021-08-25 MED ORDER — ACETAMINOPHEN 650 MG RE SUPP: 650.0000 mg | Freq: Four times a day (QID) | RECTAL | Status: DC | PRN

## 2021-08-25 MED ORDER — ACETAMINOPHEN 325 MG PO TABS
650.0000 mg | ORAL_TABLET | Freq: Four times a day (QID) | ORAL | Status: DC | PRN
Start: 2021-08-25 — End: 2021-08-30

## 2021-08-25 MED ORDER — SODIUM BICARBONATE 650 MG PO TABS
650.0000 mg | ORAL_TABLET | Freq: Two times a day (BID) | ORAL | Status: DC
  Administered 2021-08-25 – 2021-08-30 (×10): 650 mg via ORAL
  Filled 2021-08-25 (×10): qty 1

## 2021-08-25 MED ORDER — FUROSEMIDE 10 MG/ML IJ SOLN
80.0000 mg | Freq: Two times a day (BID) | INTRAMUSCULAR | Status: DC
  Administered 2021-08-26 – 2021-08-28 (×5): 80 mg via INTRAVENOUS
  Filled 2021-08-25 (×5): qty 8

## 2021-08-25 MED ORDER — INSULIN ASPART 100 UNIT/ML IJ SOLN
0.0000 [IU] | Freq: Three times a day (TID) | INTRAMUSCULAR | Status: DC
  Administered 2021-08-26: 2 [IU] via SUBCUTANEOUS
  Administered 2021-08-26 – 2021-08-27 (×3): 1 [IU] via SUBCUTANEOUS

## 2021-08-25 MED ORDER — CALCITRIOL 0.25 MCG PO CAPS
0.2500 ug | ORAL_CAPSULE | ORAL | Status: DC
  Administered 2021-08-26 – 2021-08-29 (×2): 0.25 ug via ORAL
  Filled 2021-08-25 (×4): qty 1

## 2021-08-25 MED ORDER — AMLODIPINE BESYLATE 2.5 MG PO TABS
2.5000 mg | ORAL_TABLET | Freq: Every day | ORAL | Status: DC
Start: 2021-08-26 — End: 2021-08-30
  Administered 2021-08-26 – 2021-08-30 (×5): 2.5 mg via ORAL
  Filled 2021-08-25 (×5): qty 1

## 2021-08-25 MED ORDER — TAMSULOSIN HCL 0.4 MG PO CAPS
0.4000 mg | ORAL_CAPSULE | Freq: Every day | ORAL | Status: DC
  Administered 2021-08-25 – 2021-08-29 (×5): 0.4 mg via ORAL
  Filled 2021-08-25 (×5): qty 1

## 2021-08-25 MED ORDER — HYDRALAZINE HCL 50 MG PO TABS
50.0000 mg | ORAL_TABLET | Freq: Two times a day (BID) | ORAL | Status: DC
  Administered 2021-08-25 – 2021-08-30 (×10): 50 mg via ORAL
  Filled 2021-08-25 (×10): qty 1

## 2021-08-25 MED ORDER — ISOSORBIDE DINITRATE 10 MG PO TABS
20.0000 mg | ORAL_TABLET | Freq: Two times a day (BID) | ORAL | Status: DC
  Administered 2021-08-25 – 2021-08-30 (×10): 20 mg via ORAL
  Filled 2021-08-25 (×10): qty 2

## 2021-08-25 MED ORDER — ALLOPURINOL 100 MG PO TABS
50.0000 mg | ORAL_TABLET | ORAL | Status: DC
  Administered 2021-08-27 – 2021-08-29 (×2): 50 mg via ORAL
  Filled 2021-08-25 (×4): qty 1

## 2021-08-25 MED ORDER — FUROSEMIDE 10 MG/ML IJ SOLN
80.0000 mg | Freq: Once | INTRAMUSCULAR | Status: AC
  Administered 2021-08-25: 80 mg via INTRAVENOUS
  Filled 2021-08-25: qty 8

## 2021-08-25 MED ORDER — ASPIRIN EC 81 MG PO TBEC
81.0000 mg | DELAYED_RELEASE_TABLET | Freq: Every evening | ORAL | Status: DC
  Administered 2021-08-25 – 2021-08-29 (×5): 81 mg via ORAL
  Filled 2021-08-25 (×6): qty 1

## 2021-08-25 MED ORDER — HEPARIN SODIUM (PORCINE) 5000 UNIT/ML IJ SOLN
5000.0000 [IU] | Freq: Three times a day (TID) | INTRAMUSCULAR | Status: DC
  Administered 2021-08-25 – 2021-08-30 (×14): 5000 [IU] via SUBCUTANEOUS
  Filled 2021-08-25 (×14): qty 1

## 2021-08-25 MED ORDER — FINASTERIDE 5 MG PO TABS
5.0000 mg | ORAL_TABLET | Freq: Every evening | ORAL | Status: DC
  Administered 2021-08-25 – 2021-08-29 (×5): 5 mg via ORAL
  Filled 2021-08-25 (×5): qty 1

## 2021-08-25 MED ORDER — ATORVASTATIN CALCIUM 40 MG PO TABS
40.0000 mg | ORAL_TABLET | Freq: Every day | ORAL | Status: DC
  Administered 2021-08-25 – 2021-08-29 (×5): 40 mg via ORAL
  Filled 2021-08-25 (×6): qty 1

## 2021-08-25 MED ORDER — SODIUM CHLORIDE 0.9% FLUSH
3.0000 mL | Freq: Two times a day (BID) | INTRAVENOUS | Status: DC
  Administered 2021-08-25 – 2021-08-29 (×7): 3 mL via INTRAVENOUS

## 2021-08-25 MED ORDER — POLYETHYLENE GLYCOL 3350 17 G PO PACK: 17.0000 g | PACK | Freq: Every day | ORAL | Status: DC | PRN

## 2021-08-25 MED ORDER — METOPROLOL SUCCINATE ER 50 MG PO TB24
50.0000 mg | ORAL_TABLET | Freq: Every day | ORAL | Status: DC
  Administered 2021-08-26 – 2021-08-30 (×5): 50 mg via ORAL
  Filled 2021-08-25 (×5): qty 1

## 2021-08-25 NOTE — H&P (Addendum)
History and Physical   Robert Lucas KGU:[id number] DOB: 1940/07/08 DOA: 08/25/2021  PCP: [name], NP   Patient coming from: Home  Chief Complaint: Shortness of breath  HPI: Robert Lucas is a 81 y.o. male with medical history significant of CKD 4/5, CAD status post CABG, CHF, OSA, diabetes, prostatic hypertrophy, hypertension, gout, Bell's palsy presenting with worsening shortness of breath.  Patient states for the past week he has had progressive worsening shortness of breath.  Reports orthopnea and paroxysmal nocturnal dyspnea as well.  He is prescribed Lasix but only uses this as needed for edema but has not been using it currently.  He does report lower extremity edema and abdominal swelling as well.  He denies fevers, chills (but does feel cold at times), chest pain, abdominal pain, constipation, diarrhea, nausea, vomiting.  ED Course: Vital signs in the ED significant for temperature of 97.5, respiratory rate in the 20s, blood pressure in the 628B to 151V systolic.  Lab work-up showed BMP with creatinine elevated to 4.53 which is improved from recent labs 3 months ago but increased from previous baseline 1 year ago of around 4.  Noted to have BUN elevated to 55 with bicarb of 19 and gap borderline at 13.  Calcium 8.3.  CBC with hemoglobin stable at 8.7 (stable from recent labs but down from around 11-year ago.  BNP elevated to greater than 1900.  Troponin flat at 42 and 43 on repeat.  Respiratory panel for flu and COVID-negative.  Chest x-ray so changes consistent with mild CHF.  Nephrology was consulted and recommended 80 of IV Lasix and to increase this to 120 of IV Lasix if patient fails to respond.  Reportedly nephrology's will also discussed case with cardiology.  80 mg IV Lasix was been ordered in the ED.  Review of Systems: As per HPI otherwise all other systems reviewed and are negative.  Past Medical History:  Diagnosis Date   Arthritis    Bell's palsy    one  episode   CKD (chronic kidney disease)    [facility] [place]   Diabetes [facility])    type 2   sees endo at [place] in [place]   Hypercholesteremia    Hypertension    Myocardial infarction [facility]) 1990   Neuromuscular disorder ([place])    PONV (postoperative nausea and vomiting)     Past Surgical History:  Procedure Laterality Date   AV FISTULA PLACEMENT Left 05/17/2021   Procedure: LEFT ARM ARTERIOVENOUS (AV) FISTULA CREATION;  Surgeon: Broadus John, MD;  Location: [place];  Service: Vascular;  Laterality: Left;  PERIPHERAL NERVE BLOCK   BACK SURGERY  yrs ago   lower   CARDIAC CATHETERIZATION     CATARACT EXTRACTION Right 01/2021   CHOLECYSTECTOMY N/A 05/02/2017   Procedure: LAPAROSCOPIC CHOLECYSTECTOMY;  Surgeon: Ralene Ok, MD;  Location: [place];  Service: General;  Laterality: N/A;   CORONARY ANGIOPLASTY     2 1990   EYE SURGERY Left 03/2021   cataract removal   KNEE SURGERY Left yrs ago   arthroscopic   ROTATOR CUFF REPAIR Left yrs ago   [place] Right 04/23/2013   Procedure: RIGHT SHOULDER ROTATOR CUFF REPAIR WITH GRAFT AND ANCHORS ;  Surgeon: Tobi Bastos, MD;  Location: [facility];  Service: Orthopedics;  Laterality: Right;   TONSILLECTOMY     TOTAL HIP ARTHROPLASTY Right 05/06/2019   Procedure: TOTAL HIP ARTHROPLASTY ANTERIOR APPROACH;  Surgeon: Paralee Cancel, MD;  Location:  [facility];  Service: Orthopedics;  Laterality: Right;  70 mins    Social History  reports that he quit smoking about 33 years ago. His smoking use included cigars. He has never used smokeless tobacco. He reports that he does not currently use alcohol. He reports that he does not use drugs.  Allergies  Allergen Reactions   Lisinopril Cough   Hydrocodone Nausea Only   Liraglutide     GI upset   Prednisone     Elevates BGL; patient prefers to not take this   Sitagliptin     Unknown   Vardenafil     Other reaction(s): flushing   Penicillins Rash and Other (See  Comments)    Rash around ankles Has patient had a PCN reaction causing immediate rash, facial/tongue/throat swelling, SOB or lightheadedness with hypotension: Yes Has patient had a PCN reaction causing severe rash involving mucus membranes or skin necrosis: Unk Has patient had a PCN reaction that required hospitalization: No Has patient had a PCN reaction occurring within the last 10 years: No If all of the above answers are "NO", then may proceed with Cephalosporin use.     Family History  Problem Relation Age of Onset   Dementia Mother    Stroke Brother   Reviewed on admission  Prior to Admission medications   Medication Sig Start Date End Date Taking? Authorizing Provider  allopurinol (ZYLOPRIM) 100 MG tablet Take 100 mg by mouth daily.   Yes [provider]  amLODipine (NORVASC) 2.5 MG tablet Take 2.5 mg by mouth daily. 03/14/21  Yes [provider]  aspirin EC 81 MG tablet Take 81 mg by mouth every evening.   Yes [provider]  atorvastatin (LIPITOR) 40 MG tablet Take 40 mg by mouth at bedtime.   Yes [provider]  calcitRIOL (ROCALTROL) 0.25 MCG capsule Take 0.25 mcg by mouth every Monday, Wednesday, and Friday.   Yes [provider]  epoetin alfa-epbx (RETACRIT) 2000 UNIT/ML injection 2,000 Units once. Injects once a month   Yes [provider]  finasteride (PROSCAR) 5 MG tablet Take 5 mg by mouth every evening.   Yes [provider]  furosemide (LASIX) 40 MG tablet TAKE 1 TABLET BY MOUTH  DAILY AS NEEDED FOR EDEMA 08/12/21  Yes Patwardhan, Manish J, MD  hydrALAZINE (APRESOLINE) 50 MG tablet Take 1 tablet (50 mg total) by mouth in the morning and at bedtime. 04/20/21  Yes Patwardhan, Manish J, MD  ipratropium (ATROVENT) 0.06 % nasal spray Place 2 sprays into both nostrils 4 (four) times daily. 08/20/21  Yes [provider]  isosorbide dinitrate (ISORDIL) 20 MG tablet Take 1 tablet (20 mg total) by mouth 2  (two) times daily. 03/23/21  Yes Patwardhan, Manish J, MD  metoprolol succinate (TOPROL-XL) 50 MG 24 hr tablet Take 1 tablet (50 mg total) by mouth daily. Take with or immediately following a meal. 07/11/20  Yes Florencia Reasons, MD  promethazine-dextromethorphan (PROMETHAZINE-DM) 6.25-15 MG/5ML syrup Take 5 mLs by mouth every 6 (six) hours as needed. 08/20/21  Yes [provider]  sodium bicarbonate 650 MG tablet Take 650 mg by mouth 2 (two) times daily.   Yes [provider]  tamsulosin (FLOMAX) 0.4 MG CAPS capsule Take 1 capsule (0.4 mg total) by mouth daily after supper. Patient taking differently: Take 0.4 mg by mouth at bedtime. 07/11/20  Yes Florencia Reasons, MD  acetaminophen (TYLENOL) 500 MG tablet Take 500 mg by mouth every 6 (six) hours as needed for  moderate pain or headache.    [provider]  Aromatic Inhalants (VICKS VAPOINHALER IN) Inhale 1 puff into the lungs daily as needed (congestion).    [provider]  insulin aspart protamine - aspart (NOVOLOG MIX 70/30 FLEXPEN) (70-30) 100 UNIT/ML FlexPen Inject 14-20 Units into the skin daily as needed (high blood sugar). If BS is <180=0 units, If BS is 180-200 units=14 units, If BS is >201=20 units    [provider]    Physical Exam: Vitals:   08/25/21 1700 08/25/21 1730 08/25/21 1832 08/25/21 1932  BP: (!) 177/85 (!) 176/88 (!) 145/94 (!) 144/79  Pulse: 67 72 86 74  Resp: (!) 22 (!) 24 (!) 24 (!) 25  Temp:   (!) 97.5 F (36.4 C) 97.6 F (36.4 C)  TempSrc:   Axillary Oral  SpO2: 97% 93% 95% 95%  Weight:      Height:        Physical Exam Constitutional:      General: He is not in acute distress.    Appearance: Normal appearance.  HENT:     Head: Normocephalic and atraumatic.     Mouth/Throat:     Mouth: Mucous membranes are moist.     Pharynx: Oropharynx is clear.  Eyes:     Extraocular Movements: Extraocular movements intact.     Pupils: Pupils are equal, round, and reactive to light.   Cardiovascular:     Rate and Rhythm: Normal rate and regular rhythm.     Pulses: Normal pulses.     Heart sounds: Normal heart sounds.  Pulmonary:     Effort: Pulmonary effort is normal. No respiratory distress.     Breath sounds: Rales (trace) present.  Abdominal:     General: Bowel sounds are normal. There is distension.     Palpations: Abdomen is soft.     Tenderness: There is no abdominal tenderness.  Musculoskeletal:        General: No swelling or deformity.     Right lower leg: Edema present.     Left lower leg: Edema present.  Skin:    General: Skin is warm and dry.  Neurological:     General: No focal deficit present.     Mental Status: Mental status is at baseline.   Labs on Admission: I have personally reviewed following labs and imaging studies  CBC: Recent Labs  Lab 08/25/21 1356  WBC 5.8  NEUTROABS 4.5  HGB 8.7*  HCT 27.0*  MCV 96.1  PLT 619    Basic Metabolic Panel: Recent Labs  Lab 08/25/21 1356  NA 141  K 3.8  CL 109  CO2 19*  GLUCOSE 182*  BUN 55*  CREATININE 4.53*  CALCIUM 8.3*    GFR: Estimated Creatinine Clearance: 11.2 mL/min (A) (by C-G formula based on SCr of 4.53 mg/dL (H)).  Liver Function Tests: No results for input(s): AST, ALT, ALKPHOS, BILITOT, PROT, ALBUMIN in the last 168 hours.  Urine analysis:    Component Value Date/Time   COLORURINE STRAW (A) 07/10/2020 0814   APPEARANCEUR CLEAR 07/10/2020 0814   LABSPEC 1.010 07/10/2020 0814   PHURINE 6.0 07/10/2020 0814   GLUCOSEU 50 (A) 07/10/2020 0814   HGBUR SMALL (A) 07/10/2020 0814   BILIRUBINUR NEGATIVE 07/10/2020 0814   KETONESUR NEGATIVE 07/10/2020 0814   PROTEINUR >=300 (A) 07/10/2020 0814   UROBILINOGEN 0.2 04/17/2013 0828   NITRITE NEGATIVE 07/10/2020 0814   LEUKOCYTESUR NEGATIVE 07/10/2020 0814    Radiological Exams on Admission: DG Chest 2  View  Result Date: 08/25/2021 CLINICAL DATA:  Shortness of breath EXAM: CHEST - 2 VIEW COMPARISON:  07/08/2020 FINDINGS:  Bilateral interstitial thickening. Small left pleural effusion. Trace right pleural effusion. No pneumothorax. Stable cardiomegaly. No acute osseous abnormality. IMPRESSION: 1. Findings concerning for mild CHF. Electronically Signed   By: Kathreen Devoid M.D.   On: 08/25/2021 12:56    EKG: Independently reviewed.  Irregular rhythm P waves are visible in some leads but hard to determine others.  Nonspecific ST and T wave changes.  Rate of 75 bpm.  Irregularity could be due to PACs.  Assessment/Plan Principal Problem:   CHF (congestive heart failure) (HCC) Active Problems:   Diabetes mellitus ([place])   Essential hypertension   Hyperlipidemia   Gout   Coronary artery disease involving native coronary artery of native heart without angina pectoris   Enlarged prostate   Presence of aortocoronary bypass graft   Acute renal failure superimposed on stage 4 chronic kidney disease (HCC)   CHF exacerbation > Last echo in our system in 2022 with EF 20-25%, G1 DD, normal RV function. > Presenting with worsening shortness of breath, orthopnea, prophylaxis: Nocturnal dyspnea, worsening edema. > Found to have elevated BNP to 1966.  Troponin flat at 42 and then 43 on repeat. > Currently on any prescribed Lasix as needed outpatient per chart review. > Nephrology consulted in the ED for assistance with diuresis due to CKD 4/5 as below.  Per chart review nephrology stated they would discuss the case with cardiology as well. - Monitor on telemetry - Confirm cardiology involvement in the morning - Monitor response to 80 mg IV Lasix, increase to 120 mg IV if fails to meet threshold - Trend renal function and electrolytes - Check magnesium - Strict I's and O's, daily weights - Echocardiogram - Continue home metoprolol, isosorbide dinitrate  AKI on CKD 4 > Creatinine elevated to 4.53 from apparent previous baseline around 4.  Was worse around 3 months ago at 5.5. > This could represent progression to CKD 5  versus AKI on CKD 4. > Followed by nephrology outpatient.  Does have fistula placed. > Nephrology consulted in the ED - Appreciate nephrology recommendations - Monitor response to diuresis above - Continue with home calcitriol and bicarb - Trend renal function and electrolytes  CAD HLD > Status post CABG - No current chest pain - Troponin flat at 42 and 43 on repeat in the setting of CHF exacerbation as above - Continue home atorvastatin, metoprolol, Imdur, aspirin  Hypertension > Hypertensive in the ED in the 161W to 960A systolic. - Continue home amlodipine, metoprolol, hydralazine, Imdur - Losartan recently discontinued with progressive CKD  Gout - Renal adjust allopurinol to 50 mg every other day  Prostatic hypertrophy - Continue home finasteride and tamsulosin  Diabetes > No longer on outpatient insulin - SSI  DVT prophylaxis: Heparin Code Status:   Full Family Communication:  None on admission, patient states that wife is aware of his admission and plan based on updates received in the ED. Disposition Plan:   Patient is from:  Home  Anticipated DC to:  Home  Anticipated DC date:  Pending clinical course and possible need to initiate dialysis  Anticipated DC barriers: None  Consults called:  Nephrology, consulted in the ED.  Per chart review reportedly nephrology will speak with cardiology, this will need to be confirmed. Admission status:  Inpatient, telemetry  Severity of Illness: The appropriate patient status for this patient is INPATIENT. Inpatient status is  judged to be reasonable and necessary in order to provide the required intensity of service to ensure the patient's safety. The patient's presenting symptoms, physical exam findings, and initial radiographic and laboratory data in the context of their chronic comorbidities is felt to place them at high risk for further clinical deterioration. Furthermore, it is not anticipated that the patient will be medically  stable for discharge from the hospital within 2 midnights of admission.   * I certify that at the point of admission it is my clinical judgment that the patient will require inpatient hospital care spanning beyond 2 midnights from the point of admission due to high intensity of service, high risk for further deterioration and high frequency of surveillance required.Marcelyn Bruins MD [facility]  How to contact the [facility] Attending or Consulting provider [place] or covering provider during after hours [place], for this patient?   Check the care team in [facility] and look for a) attending/consulting [facility] provider listed and b) the [facility] team listed Log into [email or web address] and use [place]'s universal password to access. If you do not have the password, please contact the hospital operator. Locate the [facility] provider you are looking for under [facility] and page to a number that you can be directly reached. If you still have difficulty reaching the provider, please page the [facility] (Director on Call) for the Hospitalists listed on amion for assistance.  08/25/2021, 7:57 PM

## 2021-08-25 NOTE — ED Notes (Signed)
Haley RN @ [facility] not available for report at this time

## 2021-08-25 NOTE — ED Triage Notes (Signed)
Pt came in [place] that started several days ago. Pt states this came out of no where. Pt has hx of a heart attack in 1990, and Stage V Kidney Disease. Pt states he recently was PCP (Saturday) for respiratory complaints and labs were drawn that showed "elevated CBCs" and did not follow up.

## 2021-08-25 NOTE — ED Notes (Addendum)
Report given to Leafy Kindle RN In route to facility

## 2021-08-25 NOTE — ED Provider Notes (Signed)
[facility] EMERGENCY DEPT Provider Note   CSN: [id number] Arrival date & time: 08/25/21  1220     History  No chief complaint on file.   Robert Lucas is a 81 y.o. male.  81 year old male with past medical history of CAD, ESRD not on dialysis presents today for evaluation of worsening shortness of breath over the past week.  Patient has a fistula in place but is not currently on dialysis.  He still makes urine.  Denies fever, chills.  States he was evaluated at a walk-in clinic over the weekend was diagnosed with a URI.  He does not take a diuretic.  Denies peripheral edema.  He does report orthopnea and states he started using 2 pillows since last night.  He also endorses PND over the past 4 nights.  Endorses progressively worsening dyspnea on exertion to the point he is unable to walk from room to room without taking breaks.  He spoke with his cardiologist today       Home Medications Prior to Admission medications   Medication Sig Start Date End Date Taking? Authorizing Provider  allopurinol (ZYLOPRIM) 100 MG tablet Take 100 mg by mouth daily.   Yes [provider]  amLODipine (NORVASC) 2.5 MG tablet Take 2.5 mg by mouth daily. 03/14/21  Yes [provider]  aspirin EC 81 MG tablet Take 81 mg by mouth every evening.   Yes [provider]  atorvastatin (LIPITOR) 40 MG tablet Take 40 mg by mouth at bedtime.   Yes [provider]  calcitRIOL (ROCALTROL) 0.25 MCG capsule Take 0.25 mcg by mouth every Monday, Wednesday, and Friday.   Yes [provider]  epoetin alfa-epbx (RETACRIT) 2000 UNIT/ML injection 2,000 Units once. Injects once a month   Yes [provider]  finasteride (PROSCAR) 5 MG tablet Take 5 mg by mouth every evening.   Yes [provider]  furosemide (LASIX) 40 MG tablet TAKE 1 TABLET BY MOUTH  DAILY AS NEEDED FOR EDEMA 08/12/21  Yes Patwardhan, Manish J, MD  hydrALAZINE (APRESOLINE) 50 MG  tablet Take 1 tablet (50 mg total) by mouth in the morning and at bedtime. 04/20/21  Yes Patwardhan, Manish J, MD  ipratropium (ATROVENT) 0.06 % nasal spray Place 2 sprays into both nostrils 4 (four) times daily. 08/20/21  Yes [provider]  isosorbide dinitrate (ISORDIL) 20 MG tablet Take 1 tablet (20 mg total) by mouth 2 (two) times daily. 03/23/21  Yes Patwardhan, Manish J, MD  metoprolol succinate (TOPROL-XL) 50 MG 24 hr tablet Take 1 tablet (50 mg total) by mouth daily. Take with or immediately following a meal. 07/11/20  Yes Florencia Reasons, MD  promethazine-dextromethorphan (PROMETHAZINE-DM) 6.25-15 MG/5ML syrup Take 5 mLs by mouth every 6 (six) hours as needed. 08/20/21  Yes [provider]  sodium bicarbonate 650 MG tablet Take 650 mg by mouth 2 (two) times daily.   Yes [provider]  tamsulosin (FLOMAX) 0.4 MG CAPS capsule Take 1 capsule (0.4 mg total) by mouth daily after supper. Patient taking differently: Take 0.4 mg by mouth at bedtime. 07/11/20  Yes Florencia Reasons, MD  acetaminophen (TYLENOL) 500 MG tablet Take 500 mg by mouth every 6 (six) hours as needed for moderate pain or headache.    [provider]  Aromatic Inhalants (VICKS VAPOINHALER IN) Inhale 1 puff into the lungs daily as needed (congestion).    [provider]  insulin aspart protamine - aspart (NOVOLOG MIX 70/30 FLEXPEN) (70-30) 100 UNIT/ML FlexPen  Inject 14-20 Units into the skin daily as needed (high blood sugar). If BS is <180=0 units, If BS is 180-200 units=14 units, If BS is >201=20 units    [provider]  traMADol (ULTRAM) 50 MG tablet Take 1 tablet (50 mg total) by mouth every 6 (six) hours as needed. Patient not taking: Reported on 08/25/2021 05/17/21 05/17/22  Dagoberto Ligas, PA-C      Allergies    Lisinopril, Hydrocodone, Liraglutide, Prednisone, Sitagliptin, Vardenafil, and Penicillins    Review of Systems   Review of Systems  Physical Exam Updated Vital Signs BP  (!) 139/97 (BP Location: Right Wrist)    Pulse 78    Temp (!) 97.3 F (36.3 C)    Resp (!) 25    Ht 5\' 3"  (1.6 m)    Wt 72.6 kg    SpO2 97%    BMI 28.34 kg/m  Physical Exam Vitals and nursing note reviewed.  Constitutional:      General: He is not in acute distress.    Appearance: Normal appearance. He is not ill-appearing.  HENT:     Head: Normocephalic and atraumatic.     Nose: Nose normal.  Eyes:     General: No scleral icterus.    Extraocular Movements: Extraocular movements intact.     Conjunctiva/sclera: Conjunctivae normal.  Cardiovascular:     Rate and Rhythm: Normal rate and regular rhythm.     Pulses: Normal pulses.     Heart sounds: Normal heart sounds.  Pulmonary:     Effort: Pulmonary effort is normal. No respiratory distress.     Breath sounds: Normal breath sounds. No wheezing or rales.  Abdominal:     General: There is distension.     Tenderness: There is no abdominal tenderness.  Musculoskeletal:        General: Normal range of motion.     Cervical back: Normal range of motion.     Right lower leg: Edema present.     Left lower leg: Edema present.  Skin:    General: Skin is warm and dry.  Neurological:     General: No focal deficit present.     Mental Status: He is alert. Mental status is at baseline.    ED Results / Procedures / Treatments   Labs (all labs ordered are listed, but only abnormal results are displayed) Labs Reviewed  BASIC METABOLIC PANEL - Abnormal; Notable for the following components:      Result Value   CO2 19 (*)    Glucose, Bld 182 (*)    BUN 55 (*)    Creatinine, Ser 4.53 (*)    Calcium 8.3 (*)    GFR, Estimated 12 (*)    All other components within normal limits  CBC WITH DIFFERENTIAL/PLATELET - Abnormal; Notable for the following components:   RBC 2.81 (*)    Hemoglobin 8.7 (*)    HCT 27.0 (*)    RDW 15.6 (*)    Lymphs Abs 0.5 (*)    All other components within normal limits  BRAIN NATRIURETIC PEPTIDE - Abnormal;  Notable for the following components:   B Natriuretic Peptide 1,966.1 (*)    All other components within normal limits  TROPONIN I (HIGH SENSITIVITY) - Abnormal; Notable for the following components:   Troponin I (High Sensitivity) 42 (*)    All other components within normal limits  RESP PANEL BY RT-PCR (FLU A&B, COVID) ARPGX2  TROPONIN I (HIGH SENSITIVITY)    EKG EKG Interpretation  Date/Time:  Thursday August 25 2021 12:29:19 EST Ventricular Rate:  75 PR Interval:    QRS Duration: 84 QT Interval:  372 QTC Calculation: 415 R Axis:   110 Text Interpretation: Undetermined rhythm Septal infarct (cited on or before 23-Jul-1999) Lateral infarct (cited on or before 16-Mar-2004) ST & T wave abnormality, consider inferior ischemia Abnormal ECG When compared with ECG of 11-Jul-2020 05:33, Significant changes have occurred Confirmed by Lacretia Leigh ([id number]) on 08/25/2021 1:26:20 PM  Radiology DG Chest 2 View  Result Date: 08/25/2021 CLINICAL DATA:  Shortness of breath EXAM: CHEST - 2 VIEW COMPARISON:  07/08/2020 FINDINGS: Bilateral interstitial thickening. Small left pleural effusion. Trace right pleural effusion. No pneumothorax. Stable cardiomegaly. No acute osseous abnormality. IMPRESSION: 1. Findings concerning for mild CHF. Electronically Signed   By: Kathreen Devoid M.D.   On: 08/25/2021 12:56    Procedures Procedures    Medications Ordered in ED Medications - No data to display  ED Course/ Medical Decision Making/ A&P Clinical Course as of 08/25/21 1506  Thu Aug 25, 2021  1502 Anion gap: 13 [AA]  1503 Discussed with nephrologist who recommends given patient 80 mg IVP Lasix now and reevaluating in 2 hours and if has not made much urine output giving an additional 120 mg IVP at that time.  Recommend admission at [facility] for further work-up and management. [AA]    Clinical Course User Index [AA] Evlyn Courier, PA-C                           Medical Decision Making  Medical Decision  Making / ED Course   This patient presents to the ED for concern of this of breath, this involves an extensive number of treatment options, and is a complaint that carries with it a high risk of complications and morbidity.  The differential diagnosis includes pneumonia, CHF, volume overload, ACS, PE  MDM: 81 year old male with past medical history of CAD, ESRD not on dialysis still making urine presents today for evaluation of worsening shortness of breath, orthopnea, PND of 1 week duration.  Patient is not on maintenance diuretics. patient is not on diuretic.  On exam does have abdominal distention, peripheral edema.  EKG without acute hyperkalemic changes.  Chest x-ray with mild pulmonary edema.  Work-up significant for hemoglobin of 8.7 which is around his baseline.  BMP of creatinine of 4.53 which is slightly improved from 6 months ago.  BNP of 1966.  Without acute electrolyte derangements.  Troponin of 42.  Previously elevated 1 year ago around 50.  We will continue to trend.  Discussed with nephrology.  We will give IV push Lasix now and reevaluate.  Will discuss with hospitalist for admission.   Lab Tests: -I ordered, reviewed, and interpreted labs.   The pertinent results include:   Labs Reviewed  BASIC METABOLIC PANEL - Abnormal; Notable for the following components:      Result Value   CO2 19 (*)    Glucose, Bld 182 (*)    BUN 55 (*)    Creatinine, Ser 4.53 (*)    Calcium 8.3 (*)    GFR, Estimated 12 (*)    All other components within normal limits  CBC WITH DIFFERENTIAL/PLATELET - Abnormal; Notable for the following components:   RBC 2.81 (*)    Hemoglobin 8.7 (*)    HCT 27.0 (*)    RDW 15.6 (*)    Lymphs Abs 0.5 (*)    All  other components within normal limits  BRAIN NATRIURETIC PEPTIDE - Abnormal; Notable for the following components:   B Natriuretic Peptide 1,966.1 (*)    All other components within normal limits  TROPONIN I (HIGH SENSITIVITY) - Abnormal; Notable for  the following components:   Troponin I (High Sensitivity) 42 (*)    All other components within normal limits  RESP PANEL BY RT-PCR (FLU A&B, COVID) ARPGX2  TROPONIN I (HIGH SENSITIVITY)      EKG  EKG Interpretation  Date/Time:  Thursday August 25 2021 12:29:19 EST Ventricular Rate:  75 PR Interval:    QRS Duration: 84 QT Interval:  372 QTC Calculation: 415 R Axis:   110 Text Interpretation: Undetermined rhythm Septal infarct (cited on or before 23-Jul-1999) Lateral infarct (cited on or before 16-Mar-2004) ST & T wave abnormality, consider inferior ischemia Abnormal ECG When compared with ECG of 11-Jul-2020 05:33, Significant changes have occurred Confirmed by Lacretia Leigh ([id number]) on 08/25/2021 1:26:20 PM         Imaging Studies ordered: I ordered imaging studies including chest x-ray I independently visualized and interpreted imaging. I agree with the radiologist interpretation   Medicines ordered and prescription drug management: Meds ordered this encounter  Medications   furosemide (LASIX) injection 80 mg    -I have reviewed the patients home medicines and have made adjustments as needed  Cardiac Monitoring: The patient was maintained on a cardiac monitor.  I personally viewed and interpreted the cardiac monitored which showed an underlying rhythm of: Normal sinus rhythm  Reevaluation: After the interventions noted above, I reevaluated the patient and found that they have :stayed the same  Co morbidities that complicate the patient evaluation  Past Medical History:  Diagnosis Date   Arthritis    Bell's palsy    one episode   CKD (chronic kidney disease)    sees [place] in [place]   Diabetes [facility])    type 2   sees endo at [place] in [place]   Hypercholesteremia    Hypertension    Myocardial infarction [facility]) 1990   Neuromuscular disorder ([place])    PONV (postoperative nausea and vomiting)       Dispostion: Patient will be admitted.  Spoke to  hospitalist who will accept patient for admission.   Final Clinical Impression(s) / ED Diagnoses Final diagnoses:  ESRD (end stage renal disease) ([place])  Hypervolemia, unspecified hypervolemia type    Rx / DC Orders ED Discharge Orders     None         Evlyn Courier, PA-C 08/25/21 1605    Lacretia Leigh, MD 08/29/21 1014

## 2021-08-25 NOTE — ED Notes (Signed)
Report given to [facility]

## 2021-08-25 NOTE — Telephone Encounter (Signed)
Spoke with the patient., You do not need to call.  Severe respiratory distress, difficulty completing sentences over the phone. Cr was 5.5 in 05/2021, steadily rising. He has dialysis access in left arm, not in use it. I am concerned he could be in fluid overload with worsening renal function. Recommended ER evaluation.  Thanks MJP

## 2021-08-25 NOTE — ED Provider Notes (Signed)
I provided a substantive portion of the care of this patient.  I personally performed the entirety of the medical decision making for this encounter.  EKG Interpretation  Date/Time:  Thursday August 25 2021 12:29:19 EST Ventricular Rate:  75 PR Interval:    QRS Duration: 84 QT Interval:  372 QTC Calculation: 415 R Axis:   110 Text Interpretation: Undetermined rhythm Septal infarct (cited on or before 23-Jul-1999) Lateral infarct (cited on or before 16-Mar-2004) ST & T wave abnormality, consider inferior ischemia Abnormal ECG When compared with ECG of 11-Jul-2020 05:33, Significant changes have occurred Confirmed by Lacretia Leigh ([id number]) on 08/25/2021 1:26:20 PM   Patient here complaining of shortness of breath.  Known history of chronic kidney disease.  Chest x-ray consistent with CHF.  Will consult nephrology and patient will require admission   Lacretia Leigh, MD 08/25/21 [phone]

## 2021-08-25 NOTE — Plan of Care (Addendum)
Robert Lucas is a 81 year old Caucasian male with a past medical history of MI in the 90s, baseline anemia, end-stage renal disease not on dialysis but has fistula placement who presented with shortness of breath and volume overload for 1 week.  He has conversational dyspnea, PND and orthopnea and does not have a history of CHF but does see cardiology Dr. Virgina Jock.  Noted to be volume overloaded and BNP was elevated at 1966.1.  Nephrology was consulted and Dr. Johnney Ou recommended giving the patient 80 mg of IV Lasix and if he did not respond give him another 120 mg of Lasix.  Given his volume overload will need further work-up and possible dialysis if renal function does not improve.  Chest x-ray shows volume overload and likely CHF.  Will need further work-up and improvement.  Nephrology will formally consult per EDP and they will let Cardiology know.  Was accepted to Cardiac Telemetry bed.

## 2021-08-26 ENCOUNTER — Inpatient Hospital Stay (HOSPITAL_COMMUNITY): Payer: Medicare Other

## 2021-08-26 DIAGNOSIS — I5023 Acute on chronic systolic (congestive) heart failure: Secondary | ICD-10-CM

## 2021-08-26 LAB — COMPREHENSIVE METABOLIC PANEL
ALT: 21 U/L (ref 0–44)
AST: 22 U/L (ref 15–41)
Albumin: 3.1 g/dL — ABNORMAL LOW (ref 3.5–5.0)
Alkaline Phosphatase: 74 U/L (ref 38–126)
Anion gap: 10 (ref 5–15)
BUN: 52 mg/dL — ABNORMAL HIGH (ref 8–23)
CO2: 18 mmol/L — ABNORMAL LOW (ref 22–32)
Calcium: 8.1 mg/dL — ABNORMAL LOW (ref 8.9–10.3)
Chloride: 111 mmol/L (ref 98–111)
Creatinine, Ser: 4.49 mg/dL — ABNORMAL HIGH (ref 0.61–1.24)
GFR, Estimated: 12 mL/min — ABNORMAL LOW (ref >60)
Glucose, Bld: 180 mg/dL — ABNORMAL HIGH (ref 70–99)
Potassium: 3.6 mmol/L (ref 3.5–5.1)
Sodium: 139 mmol/L (ref 135–145)
Total Bilirubin: 1.3 mg/dL — ABNORMAL HIGH (ref 0.3–1.2)
Total Protein: 6.1 g/dL — ABNORMAL LOW (ref 6.5–8.1)

## 2021-08-26 LAB — ECHOCARDIOGRAM COMPLETE
AR max vel: 2.67 cm2
AV Peak grad: 17.4 mmHg
Ao pk vel: 2.09 m/s
Area-P 1/2: 5.66 cm2
Height: 63 in
S' Lateral: 3.7 cm
Single Plane A4C EF: 27.5 %
Weight: 2560 oz

## 2021-08-26 LAB — CBC
HCT: 25.8 % — ABNORMAL LOW (ref 39.0–52.0)
Hemoglobin: 8.4 g/dL — ABNORMAL LOW (ref 13.0–17.0)
MCH: 31.1 pg (ref 26.0–34.0)
MCHC: 32.6 g/dL (ref 30.0–36.0)
MCV: 95.6 fL (ref 80.0–100.0)
Platelets: 161 10*3/uL (ref 150–400)
RBC: 2.7 MIL/uL — ABNORMAL LOW (ref 4.22–5.81)
RDW: 15.4 % (ref 11.5–15.5)
WBC: 5.6 10*3/uL (ref 4.0–10.5)
nRBC: 0 % (ref 0.0–0.2)

## 2021-08-26 LAB — IRON AND TIBC
Iron: 37 ug/dL — ABNORMAL LOW (ref 45–182)
Saturation Ratios: 13 % — ABNORMAL LOW (ref 17.9–39.5)
TIBC: 290 ug/dL (ref 250–450)
UIBC: 253 ug/dL

## 2021-08-26 LAB — GLUCOSE, CAPILLARY
Glucose-Capillary: 142 mg/dL — ABNORMAL HIGH (ref 70–99)
Glucose-Capillary: 149 mg/dL — ABNORMAL HIGH (ref 70–99)
Glucose-Capillary: 195 mg/dL — ABNORMAL HIGH (ref 70–99)
Glucose-Capillary: 190 mg/dL — ABNORMAL HIGH (ref 70–99)

## 2021-08-26 LAB — MAGNESIUM: Magnesium: 1.4 mg/dL — ABNORMAL LOW (ref 1.7–2.4)

## 2021-08-26 LAB — FERRITIN: Ferritin: 90 ng/mL (ref 24–336)

## 2021-08-26 LAB — PHOSPHORUS: Phosphorus: 5 mg/dL — ABNORMAL HIGH (ref 2.5–4.6)

## 2021-08-26 MED ORDER — MAGNESIUM SULFATE 2 GM/50ML IV SOLN
2.0000 g | Freq: Once | INTRAVENOUS | Status: AC
  Administered 2021-08-26: 2 g via INTRAVENOUS
  Filled 2021-08-26: qty 50

## 2021-08-26 NOTE — Assessment & Plan Note (Addendum)
Elevated cardiac troponin due to heart failure. Ruled out acute coronary syndrome.  No chest pain, echocardiogram with no wall motion abnormalities.

## 2021-08-26 NOTE — Hospital Course (Addendum)
Mr. Foree was admitted to the hospital with the working diagnosis of acute on chronic heart failure decompensation in the setting of CKD stage IV.   81 yo male with the past medical history of chronic kidney disease stage IV, CAD, heart failure, T2DM, hypertension and Bell's palsy who presented with dyspnea. Reported one week of worsening dyspnea, associated with PND and orthopnea. Positive lower extremity edema and increase abdominal girth. On his initial physical examination his blood pressure was 177/85, HR 67, RR 24, oxygen saturation 95%, his lungs had rales on auscultation, heart with S1 and S2 present and rhythmic, no gallops or rubs, no murmurs, abdomen distended and positive lower extremity edema.   Na 141, K 3,8, Cl 109, bicarb 19, glucose 182, bun 55, cr 4,53  Bnp 1,966 High sensitive troponin 42 and 43  Wbc 5,8, hgb 8,7 hct 27 and plt 163  Sars covid 19 negative   Chest radiograph with moderate cardiomegaly with bilateral hilar vascular congestion, and small bilateral pleural effusions.   EKG with 75 bpm, right axis, with normal intervals, sinus rhythm with PAC and PVC, with q wave in V1 and V2, with no significant ST segment or T wave changes.   Patient was placed on furosemide for diuresis with good response.  Slowly improving but continue need of supplemental 02 per [place]   With further diuresis he achieved euvolemic state and decision was made to discharge patient home with increased dose of oral furosemide and close follow up as outpatient.

## 2021-08-26 NOTE — TOC Initial Note (Signed)
Transition of Care [facility]) - Initial/Assessment Note    Patient Details  Name: Robert Lucas MRN: [id number] Date of Birth: 02/23/1941  Transition of Care [facility]) CM/SW Contact:    Angelita Ingles, RN Phone Number:[phone]  08/26/2021, 1:28 PM  Clinical Narrative:                  Transition of Care [facility]) Screening Note   Patient Details  Name: Robert Lucas Date of Birth: 02-10-1941   Transition of Care [facility]) CM/SW Contact:    Angelita Ingles, RN Phone Number: 08/26/2021, 1:28 PM    Transition of Care Department [facility]) has reviewed patient and no TOC needs have been identified at this time. We will continue to monitor patient advancement through interdisciplinary progression rounds. If new patient transition needs arise, please place a TOC consult.          Patient Goals and CMS Choice        Expected Discharge Plan and Services                                                Prior Living Arrangements/Services                       Activities of Daily Living      Permission Sought/Granted                  Emotional Assessment              Admission diagnosis:  CHF (congestive heart failure) ([place]) [I50.9] ESRD (end stage renal disease) ([place]) [N18.6] Hypervolemia, unspecified hypervolemia type [E87.70] Patient Active Problem List   Diagnosis Date Noted   CHF (congestive heart failure) ([place]) 08/25/2021   Acute renal failure superimposed on stage 4 chronic kidney disease ([place]) 08/25/2021   Stage 3 chronic kidney disease due to type 2 diabetes mellitus ([place]) 07/01/2021   Age-related nuclear cataract, bilateral 07/01/2021   Allergic rhinitis 07/01/2021   Allergic rhinitis due to pollen 07/01/2021   Arthropathy 07/01/2021   Calculus of gallbladder without cholecystitis without obstruction 07/01/2021   Chronic kidney disease, stage 4 (severe) ([place]) 07/01/2021   Constipation 07/01/2021   Diabetic renal disease ([place])  07/01/2021   Enlarged prostate 07/01/2021   Gallbladder calculus with acute cholecystitis and no obstruction 07/01/2021   Hypertensive heart disease without congestive heart failure 07/01/2021   Hypertrophy of prostate with urinary obstruction and other lower urinary tract symptoms (LUTS) 07/01/2021   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 associated with type 2 diabetes mellitus ([name]) 07/01/2021   Obstructive sleep apnea syndrome 07/01/2021   Old myocardial infarction 07/01/2021   Other specified counseling 07/01/2021   Postherpetic neuralgia 07/01/2021   Postsurgical percutaneous transluminal coronary angioplasty status 07/01/2021   Presence of aortocoronary bypass graft 07/01/2021   Proteinuria 07/01/2021   Senile purpura ([place]) 07/01/2021   Vitamin D deficiency 07/01/2021   HFrEF (heart failure with reduced ejection fraction) ([place]) 07/12/2020   Lobar pneumonia ([place]) 07/10/2020   CAP (community acquired pneumonia) 74/06/8785   Complication of surgical procedure 09/08/2019   Lumbar radiculopathy 09/08/2019   Degeneration of lumbar intervertebral disc 08/27/2019   Pain of left hip joint 07/17/2019   Obese 05/07/2019   S/P right THA, AA 05/06/2019   Coronary artery disease involving native coronary artery  of native heart without angina pectoris 01/28/2019   Leg edema 01/28/2019   Pain in joint of right hip 11/19/2018   Complete tear of rotator cuff 04/23/2013   Chest pain 10/13/2012   Diabetes mellitus ([place]) 10/13/2012   Essential hypertension 10/13/2012   Hyperlipidemia 10/13/2012   Gout 10/13/2012   Bell's palsy 10/13/2012   Weakness 10/13/2012   PCP:  [name], NP Pharmacy:   [facility] ORDER) [place], [place] [address] Phone: [phone] Fax: [phone]  [facility] ([place], [facility] [address] [place] [address] Phone: [phone] Fax: [phone] - [place], [place] - [address] [place] [address] [place] [address] Phone: [phone] Fax: [phone]  [facility] ([facility] ) - [place], [place] [address] Phone: [phone] Fax: [place] at [facility] [place] [address] Phone: [phone] Fax: [phone]     Social Determinants of Health (SDOH) Interventions    Readmission Risk Interventions No flowsheet data found.

## 2021-08-26 NOTE — Plan of Care (Signed)
°  Problem: Education: Goal: Ability to demonstrate management of disease process will improve 08/26/2021 0716 by Trellis Moment, RN Outcome: Progressing 08/26/2021 0715 by Trellis Moment, RN Outcome: Progressing Goal: Ability to verbalize understanding of medication therapies will improve 08/26/2021 0716 by Trellis Moment, RN Outcome: Progressing 08/26/2021 0715 by Trellis Moment, RN Outcome: Progressing Goal: Individualized Educational Video(s) 08/26/2021 0716 by Trellis Moment, RN Outcome: Progressing 08/26/2021 0715 by Trellis Moment, RN Outcome: Progressing   Problem: Activity: Goal: Capacity to carry out activities will improve 08/26/2021 0716 by Trellis Moment, RN Outcome: Progressing 08/26/2021 0715 by Trellis Moment, RN Outcome: Progressing   Problem: Cardiac: Goal: Ability to achieve and maintain adequate cardiopulmonary perfusion will improve 08/26/2021 0716 by Trellis Moment, RN Outcome: Progressing 08/26/2021 0715 by Trellis Moment, RN Outcome: Progressing   Problem: Education: Goal: Knowledge of General Education information will improve Description: Including pain rating scale, medication(s)/side effects and non-pharmacologic comfort measures Outcome: Progressing   Problem: Health Behavior/Discharge Planning: Goal: Ability to manage health-related needs will improve Outcome: Progressing   Problem: Clinical Measurements: Goal: Ability to maintain clinical measurements within normal limits will improve Outcome: Progressing Goal: Will remain free from infection Outcome: Progressing Goal: Diagnostic test results will improve Outcome: Progressing Goal: Respiratory complications will improve Outcome: Progressing Goal: Cardiovascular complication will be avoided Outcome: Progressing   Problem: Activity: Goal: Risk for activity intolerance will decrease Outcome: Progressing   Problem: Nutrition: Goal: Adequate nutrition will be maintained Outcome: Progressing   Problem:  Coping: Goal: Level of anxiety will decrease Outcome: Progressing   Problem: Elimination: Goal: Will not experience complications related to bowel motility Outcome: Progressing Goal: Will not experience complications related to urinary retention Outcome: Progressing   Problem: Pain Managment: Goal: General experience of comfort will improve Outcome: Progressing   Problem: Safety: Goal: Ability to remain free from injury will improve Outcome: Progressing   Problem: Skin Integrity: Goal: Risk for impaired skin integrity will decrease Outcome: Progressing

## 2021-08-26 NOTE — Progress Notes (Signed)
Pt just went into a 7 beat run of Vtach at 1325, pt was asymptomatic and is now in NSR. Dr. Cathlean Sauer made aware, no new orders at this time, will continue to monitor.

## 2021-08-26 NOTE — Consult Note (Signed)
Reason for Consult:CKD stage V not yet on dialysis Referring Physician: Cathlean Sauer, MD  Robert Lucas is an 81 y.o. male with a PMH significant for DM type 2, HTN, HLD, CAD s/p CABG, OSA, BPH, chronic diastolic and systolic CHF (EF 40-10% and grade 1 DD), and CKD stage V (followed by Dr. Joelyn Oms) and not yet on dialysis who presented to [facility] ED with a 1 week history of worsening SOB and DOE.  He also reported some orthopnea and PND for the past 4 nights.  In the ED he was afebrile and vital signs were stable but did have tachypnea. SpO2 was 94% on room air.  CXR with findings concerning for mild CHF.  He was given IV lasix 80 mg and had some UOP since.  Labs were notable for BUN 55, Cr 4.53, Co2 19, Hgb 8.7, BNP 1966.1, troponin I 42.  He was admitted for further treatment of his acute on chronic CHF.  We were consulted due to his advanced CKD stage V and possible need for dialysis.  Of note, he was seen 2 weeks ago at our office and his Scr was 5 at that time.  He has a LUE AVF created 05/2021.  He denies any N/V/dysgeusia, anorexia, malaise.  Trend in Creatinine: Creatinine, Ser  Date/Time Value Ref Range Status  08/26/2021 12:50 AM 4.49 (H) 0.61 - 1.24 mg/dL Final  08/25/2021 01:56 PM 4.53 (H) 0.61 - 1.24 mg/dL Final  05/17/2021 06:22 AM 5.50 (H) 0.61 - 1.24 mg/dL Final  07/11/2020 05:49 AM 3.98 (H) 0.61 - 1.24 mg/dL Final  07/10/2020 05:57 AM 3.74 (H) 0.61 - 1.24 mg/dL Final  07/09/2020 05:27 AM 3.56 (H) 0.61 - 1.24 mg/dL Final  07/08/2020 06:59 PM 3.52 (H) 0.61 - 1.24 mg/dL Final  05/07/2019 02:34 AM 2.55 (H) 0.61 - 1.24 mg/dL Final  04/29/2019 09:59 AM 2.28 (H) 0.61 - 1.24 mg/dL Final  11/15/2017 05:26 PM 2.04 (H) 0.61 - 1.24 mg/dL Final  04/30/2017 12:51 PM 2.07 (H) 0.61 - 1.24 mg/dL Final  04/17/2013 08:35 AM 1.30 0.50 - 1.35 mg/dL Final  10/13/2012 01:55 AM 1.18 0.50 - 1.35 mg/dL Final  10/12/2012 10:15 PM 1.29 0.50 - 1.35 mg/dL Final  01/09/2010 04:40 AM 1.79 (H) 0.4 - 1.5 mg/dL  Final  01/08/2010 04:45 AM 1.58 (H) 0.4 - 1.5 mg/dL Final  01/07/2010 04:20 AM 1.52 (H) 0.4 - 1.5 mg/dL Final  01/04/2010 08:20 AM 1.59 (H) 0.4 - 1.5 mg/dL Final  02/04/2007 07:45 AM 1.22  Final    PMH:   Past Medical History:  Diagnosis Date   Arthritis    Bell's palsy    one episode   CKD (chronic kidney disease)    [facility] [place]   Diabetes ([place])    type 2   sees endo at [place] in [place]   Hypercholesteremia    Hypertension    Myocardial infarction ([place]) 1990   Neuromuscular disorder ([place])    PONV (postoperative nausea and vomiting)     PSH:   Past Surgical History:  Procedure Laterality Date   AV FISTULA PLACEMENT Left 05/17/2021   Procedure: LEFT ARM ARTERIOVENOUS (AV) FISTULA CREATION;  Surgeon: Broadus John, MD;  Location: [facility] OR;  Service: Vascular;  Laterality: Left;  PERIPHERAL NERVE BLOCK   BACK SURGERY  yrs ago   lower   CARDIAC CATHETERIZATION     CATARACT EXTRACTION Right 01/2021   CHOLECYSTECTOMY N/A 05/02/2017   Procedure: LAPAROSCOPIC CHOLECYSTECTOMY;  Surgeon: Ralene Ok, MD;  Location:  [place] OR;  Service: General;  Laterality: N/A;   CORONARY ANGIOPLASTY     2 1990   EYE SURGERY Left 03/2021   cataract removal   KNEE SURGERY Left yrs ago   arthroscopic   ROTATOR CUFF REPAIR Left yrs ago   SHOULDER OPEN ROTATOR CUFF REPAIR Right 04/23/2013   Procedure: RIGHT SHOULDER ROTATOR CUFF REPAIR WITH GRAFT AND ANCHORS ;  Surgeon: Tobi Bastos, MD;  Location: [facility] ORS;  Service: Orthopedics;  Laterality: Right;   TONSILLECTOMY     TOTAL HIP ARTHROPLASTY Right 05/06/2019   Procedure: TOTAL HIP ARTHROPLASTY ANTERIOR APPROACH;  Surgeon: Paralee Cancel, MD;  Location: [facility] ORS;  Service: Orthopedics;  Laterality: Right;  70 mins    Allergies:  Allergies  Allergen Reactions   Lisinopril Cough   Hydrocodone Nausea Only   Liraglutide     GI upset   Prednisone     Elevates BGL; patient prefers to not take this   Sitagliptin     Unknown    Vardenafil     Other reaction(s): flushing   Penicillins Rash and Other (See Comments)    Rash around ankles Has patient had a PCN reaction causing immediate rash, facial/tongue/throat swelling, SOB or lightheadedness with hypotension: Yes Has patient had a PCN reaction causing severe rash involving mucus membranes or skin necrosis: Unk Has patient had a PCN reaction that required hospitalization: No Has patient had a PCN reaction occurring within the last 10 years: No If all of the above answers are "NO", then may proceed with Cephalosporin use.     Medications:   Prior to Admission medications   Medication Sig Start Date End Date Taking? Authorizing Provider  allopurinol (ZYLOPRIM) 100 MG tablet Take 100 mg by mouth daily.   Yes [provider]  amLODipine (NORVASC) 2.5 MG tablet Take 2.5 mg by mouth daily. 03/14/21  Yes [provider]  aspirin EC 81 MG tablet Take 81 mg by mouth every evening.   Yes [provider]  atorvastatin (LIPITOR) 40 MG tablet Take 40 mg by mouth at bedtime.   Yes [provider]  calcitRIOL (ROCALTROL) 0.25 MCG capsule Take 0.25 mcg by mouth every Monday, Wednesday, and Friday.   Yes [provider]  epoetin alfa-epbx (RETACRIT) 2000 UNIT/ML injection 2,000 Units once. Injects once a month   Yes [provider]  finasteride (PROSCAR) 5 MG tablet Take 5 mg by mouth every evening.   Yes [provider]  furosemide (LASIX) 40 MG tablet TAKE 1 TABLET BY MOUTH  DAILY AS NEEDED FOR EDEMA 08/12/21  Yes Patwardhan, Manish J, MD  hydrALAZINE (APRESOLINE) 50 MG tablet Take 1 tablet (50 mg total) by mouth in the morning and at bedtime. 04/20/21  Yes Patwardhan, Manish J, MD  ipratropium (ATROVENT) 0.06 % nasal spray Place 2 sprays into both nostrils 4 (four) times daily. 08/20/21  Yes [provider]  isosorbide dinitrate (ISORDIL) 20 MG tablet Take 1 tablet (20 mg total) by mouth 2 (two) times daily.  03/23/21  Yes Patwardhan, Manish J, MD  metoprolol succinate (TOPROL-XL) 50 MG 24 hr tablet Take 1 tablet (50 mg total) by mouth daily. Take with or immediately following a meal. 07/11/20  Yes Florencia Reasons, MD  promethazine-dextromethorphan (PROMETHAZINE-DM) 6.25-15 MG/5ML syrup Take 5 mLs by mouth every 6 (six) hours as needed. 08/20/21  Yes [provider]  sodium bicarbonate 650 MG tablet Take 650 mg by mouth 2 (two) times daily.   Yes [provider]  tamsulosin (FLOMAX) 0.4 MG CAPS capsule Take 1 capsule (0.4 mg total) by mouth daily after supper. Patient taking differently: Take 0.4 mg by mouth at bedtime. 07/11/20  Yes Florencia Reasons, MD  acetaminophen (TYLENOL) 500 MG tablet Take 500 mg by mouth every 6 (six) hours as needed for moderate pain or headache.    [provider]  Aromatic Inhalants (VICKS VAPOINHALER IN) Inhale 1 puff into the lungs daily as needed (congestion).    [provider]  insulin aspart protamine - aspart (NOVOLOG MIX 70/30 FLEXPEN) (70-30) 100 UNIT/ML FlexPen Inject 14-20 Units into the skin daily as needed (high blood sugar). If BS is <180=0 units, If BS is 180-200 units=14 units, If BS is >201=20 units    [provider]    Inpatient medications:  [START ON 08/27/2021] allopurinol  50 mg Oral QODAY   amLODipine  2.5 mg Oral Daily   aspirin EC  81 mg Oral QPM   atorvastatin  40 mg Oral QHS   calcitRIOL  0.25 mcg Oral Q M,W,F   finasteride  5 mg Oral QPM   furosemide  80 mg Intravenous BID   heparin  5,000 Units Subcutaneous Q8H   hydrALAZINE  50 mg Oral BID   insulin aspart  0-6 Units Subcutaneous TID WC   isosorbide dinitrate  20 mg Oral BID   metoprolol succinate  50 mg Oral Daily   sodium bicarbonate  650 mg Oral BID   sodium chloride flush  3 mL Intravenous Q12H   tamsulosin  0.4 mg Oral QPC supper    Discontinued Meds:   Medications Discontinued During This Encounter  Medication Reason   fluticasone (FLONASE) 50 MCG/ACT  nasal spray Patient Preference   losartan (COZAAR) 25 MG tablet Discontinued by provider   traMADol (ULTRAM) 50 MG tablet     Social History:  reports that he quit smoking about 33 years ago. His smoking use included cigars. He has never used smokeless tobacco. He reports that he does not currently use alcohol. He reports that he does not use drugs.  Family History:   Family History  Problem Relation Age of Onset   Dementia Mother    Stroke Brother     Pertinent items are noted in HPI. Weight change:   Intake/Output Summary (Last 24 hours) at 08/26/2021 1153 Last data filed at 08/26/2021 0305 Gross per 24 hour  Intake --  Output 500 ml  Net -500 ml   BP (!) 159/67 (BP Location: Right Arm)    Pulse 83    Temp 97.8 F (36.6 C) (Oral)    Resp (!) 21    Ht 5\' 3"  (1.6 m)    Wt 72.6 kg    SpO2 92%    BMI 28.34 kg/m  Vitals:   08/25/21 1932 08/25/21 2302 08/26/21 0259 08/26/21 0809  BP: (!) 144/79 (!) 144/69 (!) 163/104 (!) 159/67  Pulse: 74 81 82 83  Resp: (!) 25 20 (!) 25 (!) 21  Temp: 97.6 F (36.4 C) 97.6 F (36.4 C) 97.8 F (36.6 C)   TempSrc: Oral Oral Oral   SpO2: 95% 95% 94% 92%  Weight:      Height:         General appearance: alert, cooperative, no distress, and wearing oxygen via [place] Head: Normocephalic, without obvious abnormality, atraumatic Resp: clear to auscultation bilaterally Cardio: regular rate and rhythm, S1, S2 normal, no murmur, click, rub or gallop GI: soft, non-tender; bowel sounds normal; no masses,  no  organomegaly Extremities: edema 1+ pretibial edema bilaterally Neuro:  no asterixis  Labs: Basic Metabolic Panel: Recent Labs  Lab 08/25/21 1356 08/26/21 0050  NA 141 139  K 3.8 3.6  CL 109 111  CO2 19* 18*  GLUCOSE 182* 180*  BUN 55* 52*  CREATININE 4.53* 4.49*  ALBUMIN  --  3.1*  CALCIUM 8.3* 8.1*  PHOS  --  5.0*   Liver Function Tests: Recent Labs  Lab 08/26/21 0050  AST 22  ALT 21  ALKPHOS 74  BILITOT 1.3*  PROT 6.1*   ALBUMIN 3.1*   No results for input(s): LIPASE, AMYLASE in the last 168 hours. No results for input(s): AMMONIA in the last 168 hours. CBC: Recent Labs  Lab 08/25/21 1356 08/26/21 0050  WBC 5.8 5.6  NEUTROABS 4.5  --   HGB 8.7* 8.4*  HCT 27.0* 25.8*  MCV 96.1 95.6  PLT 163 161   PT/INR: @LABRCNTIP (inr:5) Cardiac Enzymes: )No results for input(s): CKTOTAL, CKMB, CKMBINDEX, TROPONINI in the last 168 hours. CBG: Recent Labs  Lab 08/25/21 2150 08/26/21 0619 08/26/21 1130  GLUCAP 231* 142* 149*    Iron Studies:  Recent Labs  Lab 08/26/21 0050  IRON 37*  TIBC 290  FERRITIN 90    Xrays/Other Studies: DG Chest 2 View  Result Date: 08/25/2021 CLINICAL DATA:  Shortness of breath EXAM: CHEST - 2 VIEW COMPARISON:  07/08/2020 FINDINGS: Bilateral interstitial thickening. Small left pleural effusion. Trace right pleural effusion. No pneumothorax. Stable cardiomegaly. No acute osseous abnormality. IMPRESSION: 1. Findings concerning for mild CHF. Electronically Signed   By: Kathreen Devoid M.D.   On: 08/25/2021 12:56     Assessment/Plan:  Acute on chronic diastolic and systolic CHF - given IV lasix 80 mg x 2 doses.  Will follow UOP but may need to increase dose or frequency.  He reports that he is feeling much better since being placed on supplemental oxygen.  For repeat ECHO today. CKD stage V - Scr is stable to improved and has no uremic symptoms.  Would only initiate HD if he wasn't responding to diuretics or developed worsening renal function and uremia.  Will continue to follow for now. Anemia of CKD stage V - s/p retacrit injection 20,000 units SQ on 08/10/21.  Will check iron stores and follow.  Transfuse prn <7. CKD-BMD - continue with calcitriol CAD - no chest pain. BPH - no issues with urination.  DM type 2 - per primary   [place] 08/26/2021, 11:53 AM

## 2021-08-26 NOTE — Plan of Care (Signed)
  Problem: Education: Goal: Ability to demonstrate management of disease process will improve Outcome: Progressing Goal: Ability to verbalize understanding of medication therapies will improve Outcome: Progressing   Problem: Activity: Goal: Capacity to carry out activities will improve Outcome: Progressing   

## 2021-08-26 NOTE — Assessment & Plan Note (Addendum)
His blood pressure remained well controlled during his hospitalization.   Plan to continue with amlodipine, hydralazine,  isosorbide and metoprolol for blood pressure control. Continue diuresis with furosemide, dose increased to 80 mg po bid.

## 2021-08-26 NOTE — Assessment & Plan Note (Addendum)
His glucose remained stable during his hospitalization, he did no require insulin therapy.   Continue with atorvastatin.

## 2021-08-26 NOTE — Assessment & Plan Note (Addendum)
Volume overload due to worsening renal function. Patient was placed on IV furosemide with improvement of volume status and symptoms.   At the time of discharge his fluid balance was negative 5,992 ml.   Further work up with echocardiogram with EF 30 to 35% with mild dilatation of ventricular cavity, preserved RV systolic function with severe elevation in pulmonary systolic pressure 77 mmHg.   Not able to use Raas inhibition due to risk of worsening renal function.   Continue blood pressure control with metoprolol, hydralazine, isosorbide and amlodipine.

## 2021-08-26 NOTE — Progress Notes (Signed)
Progress Note   Patient: Robert Lucas WUG:[id number] DOB: 1940-10-17 DOA: 08/25/2021     1 DOS: the patient was seen and examined on 08/26/2021   Brief hospital course: Mr. Costilla was admitted to the hospital with the working diagnosis of acute on chronic heart failure decompensation in the setting of CKD stage IV.   81 yo male with the past medical history of chronic kidney disease stage IV, CAD, heart failure, T2DM, hypertension and Bell's palsy who presented with dyspnea. Reported one week of worsening dyspnea, associated with PND and orthopnea. Positive lower extremity edema and increase abdominal girth. On his initial physical examination his blood pressure was 177/85, HR 67, RR 24, oxygen saturation 95%, his lungs had rales on auscultation, heart with S1 and S2 present and rhythmic, no gallops or rubs, no murmurs, abdomen distended and positive lower extremity edema.   Na 141, K 3,8, Cl 109, bicarb 19, glucose 182, bun 55, cr 4,53  Bnp 1,966 High sensitive troponin 42 and 43  Wbc 5,8, hgb 8,7 hct 27 and plt 163  Sars covid 19 negative   Chest radiograph with moderate cardiomegaly with bilateral hilar vascular congestion, and small bilateral pleural effusions.   EKG with 75 bpm, right axis, with normal intervals, sinus rhythm with PAC and PVC, with q wave in V1 and V2, with no significant ST segment or T wave changes.   Patient was placed on furosemide for diuresis with good response.     Assessment and Plan: * CHF (congestive heart failure) ([place]) Patient has been placed on furosemide for diuresis. His oxygenation is 92% on room air.   Urine output documented 500 cc  Plan to continue diuresis with furosemide 80 mg IV q12 hrs Continue close blood pressure monitoring.  Follow up on echocardiogram. (study from last year EF was 20 to 25%).   Acute renal failure superimposed on stage 4 chronic kidney disease ([place])- (present on admission) Hypomagnesemia  Renal function with  serum cr at 4,49 with K at 3,6 and serum bicarbonate at 18. Mg 1,3   Plan to continue diuresis with furosemide, patient continue volume overloaded.  Avoid hypotension and nephrotoxic medications.   Essential hypertension- (present on admission) Continue blood pressure monitoring Currently on aggressive diuresis with IV furosemide.   Coronary artery disease involving native coronary artery of native heart without angina pectoris- (present on admission) No chest pain, elevated cardiac troponin due to heart failure. Ruled out acute coronary syndrome.   Type 2 diabetes mellitus with hyperlipidemia ([place])- (present on admission) Fasting glucose is 180, will continue insulin sliding scale cover and monitoring.   Gout- (present on admission) No acute flare.         Subjective: Patient continue to have dyspnea and lower extremity edema, symptoms improved with supplemental 02 per [place]   Physical Exam: Vitals:   08/25/21 1932 08/25/21 2302 08/26/21 0259 08/26/21 0809  BP: (!) 144/79 (!) 144/69 (!) 163/104 (!) 159/67  Pulse: 74 81 82 83  Resp: (!) 25 20 (!) 25 (!) 21  Temp: 97.6 F (36.4 C) 97.6 F (36.4 C) 97.8 F (36.6 C)   TempSrc: Oral Oral Oral   SpO2: 95% 95% 94% 92%  Weight:      Height:       Neurology awake and alert ENT with mild pallor Cardiovascular with S1 and S2 present and rhythmic with no gallops or rubs, no murmurs No JVD Positive lower extremity edema ++ at the level of the ankles Respiratory with scattered  rhonchi and rales Abdomen soft and non tender   Data Reviewed:    Family Communication: no family at the bedside   Disposition: Status is: Inpatient Remains inpatient appropriate because: renal and heart failure management      Planned Discharge Destination: Home     Author: Tawni Millers, MD 08/26/2021 4:06 PM  For on call review [email or web address].

## 2021-08-26 NOTE — Assessment & Plan Note (Addendum)
Hypomagnesemia/ hyponatremia, hypokalemia/ non anion gap metabolic acidosis.   Acute hypoxemic respiratory failure due to volume overload.  Patient was placed on aggressive diuresis with IV furosemide with improvement in volume status, at the time of his discharge he was negative 5,992 ml.   At the time of his discharge he is no using supplemental 02.   At discharge Na 134, K 4,5. CL 102, bicarbonate 20, glucose 138, bun 57 and cr 5,08.   K was corrected with KCL Plan to continue with phosphate binders with calcium acetate. On calcitriol for metabolic bone disease.  Oral sodium bicarbonate for acidosis.    Furosemide has been increased to 80 mg po bid and have close follow up as outpatient with nephrology.

## 2021-08-26 NOTE — Progress Notes (Signed)
Echocardiogram 2D Echocardiogram has been performed.  Robert Lucas 08/26/2021, 10:15 AM

## 2021-08-26 NOTE — Progress Notes (Signed)
Heart Failure Navigator Progress Note  Assessed for Heart & Vascular TOC clinic readiness.  Patient does not meet criteria due to advanced CKD. SCr >4.   Navigator available for educational resources.   Kerby Nora, PharmD, BCPS Heart Failure Stewardship Pharmacist Phone [phone]

## 2021-08-26 NOTE — Assessment & Plan Note (Signed)
No acute flare.  

## 2021-08-27 DIAGNOSIS — D631 Anemia in chronic kidney disease: Secondary | ICD-10-CM

## 2021-08-27 DIAGNOSIS — N189 Chronic kidney disease, unspecified: Secondary | ICD-10-CM | POA: Diagnosis present

## 2021-08-27 LAB — GLUCOSE, CAPILLARY
Glucose-Capillary: 137 mg/dL — ABNORMAL HIGH (ref 70–99)
Glucose-Capillary: 163 mg/dL — ABNORMAL HIGH (ref 70–99)
Glucose-Capillary: 177 mg/dL — ABNORMAL HIGH (ref 70–99)
Glucose-Capillary: 149 mg/dL — ABNORMAL HIGH (ref 70–99)

## 2021-08-27 LAB — CBC
HCT: 24.3 % — ABNORMAL LOW (ref 39.0–52.0)
Hemoglobin: 8.1 g/dL — ABNORMAL LOW (ref 13.0–17.0)
MCH: 31.6 pg (ref 26.0–34.0)
MCHC: 33.3 g/dL (ref 30.0–36.0)
MCV: 94.9 fL (ref 80.0–100.0)
Platelets: 156 10*3/uL (ref 150–400)
RBC: 2.56 MIL/uL — ABNORMAL LOW (ref 4.22–5.81)
RDW: 15.3 % (ref 11.5–15.5)
WBC: 5 10*3/uL (ref 4.0–10.5)
nRBC: 0 % (ref 0.0–0.2)

## 2021-08-27 LAB — RENAL FUNCTION PANEL
Albumin: 2.8 g/dL — ABNORMAL LOW (ref 3.5–5.0)
Anion gap: 13 (ref 5–15)
BUN: 52 mg/dL — ABNORMAL HIGH (ref 8–23)
CO2: 18 mmol/L — ABNORMAL LOW (ref 22–32)
Calcium: 7.8 mg/dL — ABNORMAL LOW (ref 8.9–10.3)
Chloride: 105 mmol/L (ref 98–111)
Creatinine, Ser: 4.58 mg/dL — ABNORMAL HIGH (ref 0.61–1.24)
GFR, Estimated: 12 mL/min — ABNORMAL LOW (ref >60)
Glucose, Bld: 148 mg/dL — ABNORMAL HIGH (ref 70–99)
Phosphorus: 5.4 mg/dL — ABNORMAL HIGH (ref 2.5–4.6)
Potassium: 3.4 mmol/L — ABNORMAL LOW (ref 3.5–5.1)
Sodium: 136 mmol/L (ref 135–145)

## 2021-08-27 LAB — IRON AND TIBC
Iron: 44 ug/dL — ABNORMAL LOW (ref 45–182)
Saturation Ratios: 17 % — ABNORMAL LOW (ref 17.9–39.5)
TIBC: 266 ug/dL (ref 250–450)
UIBC: 222 ug/dL

## 2021-08-27 LAB — FERRITIN: Ferritin: 87 ng/mL (ref 24–336)

## 2021-08-27 MED ORDER — SODIUM CHLORIDE 0.9 % IV SOLN
100.0000 mg | INTRAVENOUS | Status: AC
  Administered 2021-08-27 – 2021-08-28 (×2): 100 mg via INTRAVENOUS
  Filled 2021-08-27 (×2): qty 5

## 2021-08-27 MED ORDER — POTASSIUM CHLORIDE 20 MEQ PO PACK
20.0000 meq | PACK | Freq: Two times a day (BID) | ORAL | Status: DC
  Administered 2021-08-27 – 2021-08-29 (×6): 20 meq via ORAL
  Filled 2021-08-27 (×6): qty 1

## 2021-08-27 MED ORDER — CALCIUM ACETATE (PHOS BINDER) 667 MG PO CAPS
667.0000 mg | ORAL_CAPSULE | Freq: Three times a day (TID) | ORAL | Status: DC
Start: 2021-08-27 — End: 2021-08-30
  Administered 2021-08-27 – 2021-08-30 (×9): 667 mg via ORAL
  Filled 2021-08-27 (×10): qty 1

## 2021-08-27 NOTE — Assessment & Plan Note (Addendum)
Iron panel with serum iron of 44, tibc 266, transferrin saturation 17 and ferritin 87. Consistent in anemia of chronic disease in combination of iron deficiency.  Continue IV iron and follow up iron panel as outpatient  Follow up cell count as outpatient.

## 2021-08-27 NOTE — Plan of Care (Signed)

## 2021-08-27 NOTE — Progress Notes (Signed)
Progress Note   Patient: Robert Lucas RXV:[id number] DOB: [birth year] DOA: 08/25/2021     2 DOS: the patient was seen and examined on 08/27/2021   Brief hospital course: Mr. Doggett was admitted to the hospital with the working diagnosis of acute on chronic heart failure decompensation in the setting of CKD stage IV.   81 yo male with the past medical history of chronic kidney disease stage IV, CAD, heart failure, T2DM, hypertension and Bell's palsy who presented with dyspnea. Reported one week of worsening dyspnea, associated with PND and orthopnea. Positive lower extremity edema and increase abdominal girth. On his initial physical examination his blood pressure was 177/85, HR 67, RR 24, oxygen saturation 95%, his lungs had rales on auscultation, heart with S1 and S2 present and rhythmic, no gallops or rubs, no murmurs, abdomen distended and positive lower extremity edema.   Na 141, K 3,8, Cl 109, bicarb 19, glucose 182, bun 55, cr 4,53  Bnp 1,966 High sensitive troponin 42 and 43  Wbc 5,8, hgb 8,7 hct 27 and plt 163  Sars covid 19 negative   Chest radiograph with moderate cardiomegaly with bilateral hilar vascular congestion, and small bilateral pleural effusions.   EKG with 75 bpm, right axis, with normal intervals, sinus rhythm with PAC and PVC, with q wave in V1 and V2, with no significant ST segment or T wave changes.   Patient was placed on furosemide for diuresis with good response.     Assessment and Plan: * Acute on chronic systolic CHF (congestive heart failure) (HCC)- (present on admission) Echocardiogram with EF 30 to 35% with mild dilatation of ventricular cavity, preserved RV systolic function with severe elevation in pulmonary systolic pressure 77 mmHg.   Edema and dyspnea has been improving Urine output is 5,093 ml  Systolic blood pressure in the 140 range.   Plan to continue aggressive diuresis with furosemide. Heart failure management with metoprolol, not able  to use Raas inhibition due to risk of worsening renal function.    Acute renal failure superimposed on stage 4 chronic kidney disease ([place])- (present on admission) Hypomagnesemia/ hypokalemia/ non anion gap metabolic acidosis.   Patient has been responding well to diuresis with improvement in volume status.  His serum cr today is 4.58 with K at 3,4 and serum bicarbonate at 18  Phosphorus is 5,4   Plan to continue diuresis with furosemide 80 mg IV q12 hrs Continue with phosphate binders with calcium acetate. Continue with calcitriol for metabolic bone disease.  Oral sodium bicarbonate for acidosis.   Continue K correction with Kcl  Anemia of chronic renal failure- (present on admission) hgb at 8,1 with hct at 24,3. Iron panel with serum iron of 44, tibc 266, transferrin saturation 17 and ferritin 87. Consistent in anemia of chronic disease in combination of iron deficiency.  Continue IV iron and follow up iron panel as outpatient   Essential hypertension- (present on admission) Continue with isosorbide and metoprolol for blood pressure control. Continue diuresis with furosemide.   Coronary artery disease involving native coronary artery of native heart without angina pectoris- (present on admission) Elevated cardiac troponin due to heart failure. Ruled out acute coronary syndrome.   Type 2 diabetes mellitus with hyperlipidemia ([place])- (present on admission) Continue insulin sliding scale cover and monitoring.   Gout- (present on admission) No acute flare.         Subjective: patient is feeling better, dyspnea and edema continue to improve but not back to baseline.   Physical  Exam: Vitals:   08/26/21 2328 08/27/21 0453 08/27/21 0809 08/27/21 1129  BP: (!) 127/53 (!) 152/87 (!) 155/75 (!) 147/69  Pulse: 66 76 85 69  Resp: 20 20 (!) 21 (!) 27  Temp: 98 F (36.7 C) 97.9 F (36.6 C) 97.8 F (36.6 C) 97.6 F (36.4 C)  TempSrc: Oral Oral Oral Oral  SpO2: 99% 98% 93%  97%  Weight:  72 kg    Height:       Neurology awake and alert, out of bed to chair ENT with mild pallor Cardiovascular with S1 and S2 present and rhythmic with no gallops or rubs, positive murmur at the apex and right sternal border, systolic No JVD Lower extremity edema improved + non pitting bilaterally  Abdomen soft and non tender   Data Reviewed:    Family Communication: no family at the bedside   Disposition: Status is: Inpatient Remains inpatient appropriate because: renal failure      Planned Discharge Destination: Home   Author: Tawni Millers, MD 08/27/2021 1:37 PM  For on call review [email or web address].

## 2021-08-27 NOTE — Progress Notes (Signed)
Patient ID: Robert Lucas, male   DOB: Nov 14, 1940, 81 y.o.   MRN: [id number] S: Feeling better this morning. O:BP (!) 155/75 (BP Location: Right Arm)    Pulse 85    Temp 97.8 F (36.6 C) (Oral)    Resp (!) 21    Ht 5\' 3"  (1.6 m)    Wt 72 kg    SpO2 93%    BMI 28.12 kg/m   Intake/Output Summary (Last 24 hours) at 08/27/2021 0900 Last data filed at 08/27/2021 0454 Gross per 24 hour  Intake 300 ml  Output 1475 ml  Net -1175 ml   Intake/Output: I/O last 3 completed shifts: In: 70 [P.O.:420] Out: 1975 [FEOFH:2197]  Intake/Output this shift:  No intake/output data recorded. Weight change: -0.576 kg Gen:NAD JOI:TGPQ Resp:CTA Abd: +BS, soft, NT/ND Ext: 1+ pretibial edema bilaterally  Recent Labs  Lab 08/25/21 1356 08/26/21 0050 08/27/21 0109  NA 141 139 136  K 3.8 3.6 3.4*  CL 109 111 105  CO2 19* 18* 18*  GLUCOSE 182* 180* 148*  BUN 55* 52* 52*  CREATININE 4.53* 4.49* 4.58*  ALBUMIN  --  3.1* 2.8*  CALCIUM 8.3* 8.1* 7.8*  PHOS  --  5.0* 5.4*  AST  --  22  --   ALT  --  21  --    Liver Function Tests: Recent Labs  Lab 08/26/21 0050 08/27/21 0109  AST 22  --   ALT 21  --   ALKPHOS 74  --   BILITOT 1.3*  --   PROT 6.1*  --   ALBUMIN 3.1* 2.8*   No results for input(s): LIPASE, AMYLASE in the last 168 hours. No results for input(s): AMMONIA in the last 168 hours. CBC: Recent Labs  Lab 08/25/21 1356 08/26/21 0050 08/27/21 0109  WBC 5.8 5.6 5.0  NEUTROABS 4.5  --   --   HGB 8.7* 8.4* 8.1*  HCT 27.0* 25.8* 24.3*  MCV 96.1 95.6 94.9  PLT 163 161 156   Cardiac Enzymes: No results for input(s): CKTOTAL, CKMB, CKMBINDEX, TROPONINI in the last 168 hours. CBG: Recent Labs  Lab 08/26/21 0619 08/26/21 1130 08/26/21 1613 08/26/21 2101 08/27/21 0625  GLUCAP 142* 149* 195* 190* 137*    Iron Studies:  Recent Labs    08/27/21 0109  IRON 44*  TIBC 266  FERRITIN 87   Studies/Results: DG Chest 2 View  Result Date: 08/25/2021 CLINICAL DATA:  Shortness  of breath EXAM: CHEST - 2 VIEW COMPARISON:  07/08/2020 FINDINGS: Bilateral interstitial thickening. Small left pleural effusion. Trace right pleural effusion. No pneumothorax. Stable cardiomegaly. No acute osseous abnormality. IMPRESSION: 1. Findings concerning for mild CHF. Electronically Signed   By: Kathreen Devoid M.D.   On: 08/25/2021 12:56   ECHOCARDIOGRAM COMPLETE  Result Date: 08/26/2021    ECHOCARDIOGRAM REPORT   Patient Name:   Robert Lucas Date of Exam: 08/26/2021 Medical Rec #:  [id number]       Height:       63.0 in Accession #:    [id number]      Weight:       160.0 lb Date of Birth:  02-Apr-1941      BSA:          1.759 m Patient Age:    37 years        BP:           159/67 mmHg Patient Gender: M  HR:           83 bpm. Exam Location:  Inpatient Procedure: 2D Echo Indications:    CHF  History:        Patient has prior history of Echocardiogram examinations, most                 recent 07/10/2020. CHF, CAD; Risk Factors:Diabetes and                 Hypertension.  Sonographer:    Jefferey Pica Referring Phys: [id number] [place]  1. Left ventricular ejection fraction, by estimation, is 30 to 35%. The left ventricle has moderately decreased function. The left ventricle has no regional wall motion abnormalities. The left ventricular internal cavity size was mildly dilated. Left ventricular diastolic parameters are consistent with Grade II diastolic dysfunction (pseudonormalization).  2. Right ventricular systolic function is normal. The right ventricular size is normal. There is severely elevated pulmonary artery systolic pressure. The estimated right ventricular systolic pressure is 41.6 mmHg.  3. Left atrial size was moderately dilated.  4. The mitral valve is normal in structure. No evidence of mitral valve regurgitation. No evidence of mitral stenosis.  5. Tricuspid valve regurgitation is mild to moderate.  6. The aortic valve is tricuspid. There is mild calcification  of the aortic valve. There is mild thickening of the aortic valve. Aortic valve regurgitation is not visualized. Aortic valve sclerosis is present, with no evidence of aortic valve stenosis.  7. The inferior vena cava is dilated in size with <50% respiratory variability, suggesting right atrial pressure of 15 mmHg. FINDINGS  Left Ventricle: Left ventricular ejection fraction, by estimation, is 30 to 35%. The left ventricle has moderately decreased function. The left ventricle has no regional wall motion abnormalities. The left ventricular internal cavity size was mildly dilated. There is no left ventricular hypertrophy. Left ventricular diastolic parameters are consistent with Grade II diastolic dysfunction (pseudonormalization).  LV Wall Scoring: The mid and distal anterior septum, inferior septum, mid anterior segment, and apex are akinetic. Right Ventricle: The right ventricular size is normal. No increase in right ventricular wall thickness. Right ventricular systolic function is normal. There is severely elevated pulmonary artery systolic pressure. The tricuspid regurgitant velocity is 3.96 m/s, and with an assumed right atrial pressure of 15 mmHg, the estimated right ventricular systolic pressure is 60.6 mmHg. Left Atrium: Left atrial size was moderately dilated. Right Atrium: Right atrial size was normal in size. Pericardium: There is no evidence of pericardial effusion. Mitral Valve: The mitral valve is normal in structure. No evidence of mitral valve regurgitation. No evidence of mitral valve stenosis. Tricuspid Valve: The tricuspid valve is normal in structure. Tricuspid valve regurgitation is mild to moderate. No evidence of tricuspid stenosis. Aortic Valve: The aortic valve is tricuspid. There is mild calcification of the aortic valve. There is mild thickening of the aortic valve. Aortic valve regurgitation is not visualized. Aortic valve sclerosis is present, with no evidence of aortic valve stenosis.  Aortic valve peak gradient measures 17.4 mmHg. Pulmonic Valve: The pulmonic valve was normal in structure. Pulmonic valve regurgitation is not visualized. No evidence of pulmonic stenosis. Aorta: The aortic root is normal in size and structure. Venous: The inferior vena cava is dilated in size with less than 50% respiratory variability, suggesting right atrial pressure of 15 mmHg. IAS/Shunts: No atrial level shunt detected by color flow Doppler.  LEFT VENTRICLE PLAX 2D LVIDd:         5.70  cm LVIDs:         3.70 cm LV PW:         1.30 cm LV IVS:        1.10 cm LVOT diam:     2.10 cm LV SV:         117 LV SV Index:   67 LVOT Area:     3.46 cm  LV Volumes (MOD) LV vol d, MOD A4C: 153.0 ml LV vol s, MOD A4C: 111.0 ml LV SV MOD A4C:     153.0 ml RIGHT VENTRICLE             IVC RV Basal diam:  3.60 cm     IVC diam: 2.80 cm RV Mid diam:    3.40 cm RV S prime:     14.20 cm/s TAPSE (M-mode): 2.4 cm LEFT ATRIUM             Index        RIGHT ATRIUM           Index LA diam:        4.55 cm 2.59 cm/m   RA Area:     14.60 cm LA Vol (A2C):   90.7 ml 51.57 ml/m  RA Volume:   37.10 ml  21.10 ml/m LA Vol (A4C):   95.9 ml 54.53 ml/m LA Biplane Vol: 98.2 ml 55.84 ml/m  AORTIC VALVE                  PULMONIC VALVE AV Area (Vmax): 2.67 cm      PV Vmax:       1.04 m/s AV Vmax:        208.50 cm/s   PV Peak grad:  4.3 mmHg AV Peak Grad:   17.4 mmHg LVOT Vmax:      161.00 cm/s LVOT Vmean:     104.000 cm/s LVOT VTI:       0.338 m  AORTA Ao Root diam: 3.50 cm Ao Asc diam:  3.70 cm MITRAL VALVE                TRICUSPID VALVE MV Area (PHT): 5.66 cm     TR Peak grad:   62.7 mmHg MV Decel Time: 134 msec     TR Vmax:        396.00 cm/s MV E velocity: 112.00 cm/s MV A velocity: 50.70 cm/s   SHUNTS MV E/A ratio:  2.21         Systemic VTI:  0.34 m                             Systemic Diam: 2.10 cm Candee Furbish MD Electronically signed by Candee Furbish MD Signature Date/Time: 08/26/2021/4:45:31 PM    Final     allopurinol  50 mg Oral QODAY    amLODipine  2.5 mg Oral Daily   aspirin EC  81 mg Oral QPM   atorvastatin  40 mg Oral QHS   calcitRIOL  0.25 mcg Oral Q M,W,F   finasteride  5 mg Oral QPM   furosemide  80 mg Intravenous BID   heparin  5,000 Units Subcutaneous Q8H   hydrALAZINE  50 mg Oral BID   insulin aspart  0-6 Units Subcutaneous TID WC   isosorbide dinitrate  20 mg Oral BID   metoprolol succinate  50 mg Oral Daily   sodium bicarbonate  650 mg Oral BID   sodium chloride  flush  3 mL Intravenous Q12H   tamsulosin  0.4 mg Oral QPC supper    BMET    Component Value Date/Time   NA 136 08/27/2021 0109   K 3.4 (L) 08/27/2021 0109   CL 105 08/27/2021 0109   CO2 18 (L) 08/27/2021 0109   GLUCOSE 148 (H) 08/27/2021 0109   BUN 52 (H) 08/27/2021 0109   CREATININE 4.58 (H) 08/27/2021 0109   CALCIUM 7.8 (L) 08/27/2021 0109   GFRNONAA 12 (L) 08/27/2021 0109   GFRAA 26 (L) 05/07/2019 0234   CBC    Component Value Date/Time   WBC 5.0 08/27/2021 0109   RBC 2.56 (L) 08/27/2021 0109   HGB 8.1 (L) 08/27/2021 0109   HCT 24.3 (L) 08/27/2021 0109   PLT 156 08/27/2021 0109   MCV 94.9 08/27/2021 0109   MCH 31.6 08/27/2021 0109   MCHC 33.3 08/27/2021 0109   RDW 15.3 08/27/2021 0109   LYMPHSABS 0.5 (L) 08/25/2021 1356   MONOABS 0.5 08/25/2021 1356   EOSABS 0.2 08/25/2021 1356   BASOSABS 0.0 08/25/2021 1356    Assessment/Plan:  Acute on chronic diastolic and systolic CHF - given IV lasix 80 mg x 2 doses.  Will follow UOP but may need to increase dose or frequency.  He reports that he is feeling much better since being placed on supplemental oxygen.   Repeat ECHO showed improved EF to 30-35% but grade II DD. Continue with lasix 80 mg IV bid CKD stage V - Scr is stable to improved and has no uremic symptoms.  Would only initiate HD if he wasn't responding to diuretics or developed worsening renal function and uremia.  Will continue to follow for now. Anemia of CKD stage V - s/p retacrit injection 20,000 units SQ on 08/10/21.   Will check iron stores and follow.  Transfuse prn <7.  TSAT 17% will order IV venofer 100 mg and follow. Hypokalemia - replete and follow. CKD-BMD - continue with calcitriol, will add calcium acetate due to low Ca and high phos. CAD - no chest pain. BPH - no issues with urination.  DM type 2 - per primary  Donetta Potts, MD [facility] [phone]

## 2021-08-27 NOTE — Progress Notes (Signed)
Pharmacist Heart Failure Core Measure Documentation  Assessment: Robert Lucas has an EF documented as 30-35% on 2/24 by ECHO.  Rationale: Heart failure patients with left ventricular systolic dysfunction (LVSD) and an EF < 40% should be prescribed an angiotensin converting enzyme inhibitor (ACEI) or angiotensin receptor blocker (ARB) at discharge unless a contraindication is documented in the medical record.  This patient is not currently on an ACEI or ARB for HF.  This note is being placed in the record in order to provide documentation that a contraindication to the use of these agents is present for this encounter.  ACE Inhibitor or Angiotensin Receptor Blocker is contraindicated (specify all that apply)  []   ACEI allergy AND ARB allergy []   Angioedema []   Moderate or severe aortic stenosis []   Hyperkalemia []   Hypotension []   Renal artery stenosis [x]   Worsening renal function, preexisting renal disease or dysfunction   Antonietta Jewel, PharmD, BCCCP Clinical Pharmacist  Phone: [phone] 08/27/2021 3:50 PM  Please check AMION for all [place] phone numbers After 10:00 PM, call [place] [phone]

## 2021-08-28 LAB — RENAL FUNCTION PANEL
Albumin: 2.9 g/dL — ABNORMAL LOW (ref 3.5–5.0)
Anion gap: 11 (ref 5–15)
BUN: 56 mg/dL — ABNORMAL HIGH (ref 8–23)
CO2: 20 mmol/L — ABNORMAL LOW (ref 22–32)
Calcium: 7.9 mg/dL — ABNORMAL LOW (ref 8.9–10.3)
Chloride: 104 mmol/L (ref 98–111)
Creatinine, Ser: 4.7 mg/dL — ABNORMAL HIGH (ref 0.61–1.24)
GFR, Estimated: 12 mL/min — ABNORMAL LOW (ref >60)
Glucose, Bld: 113 mg/dL — ABNORMAL HIGH (ref 70–99)
Phosphorus: 4.9 mg/dL — ABNORMAL HIGH (ref 2.5–4.6)
Potassium: 3.9 mmol/L (ref 3.5–5.1)
Sodium: 135 mmol/L (ref 135–145)

## 2021-08-28 LAB — GLUCOSE, CAPILLARY
Glucose-Capillary: 128 mg/dL — ABNORMAL HIGH (ref 70–99)
Glucose-Capillary: 133 mg/dL — ABNORMAL HIGH (ref 70–99)
Glucose-Capillary: 143 mg/dL — ABNORMAL HIGH (ref 70–99)
Glucose-Capillary: 140 mg/dL — ABNORMAL HIGH (ref 70–99)

## 2021-08-28 LAB — PTH, INTACT AND CALCIUM
Calcium, Total (PTH): 7.6 mg/dL — ABNORMAL LOW (ref 8.6–10.2)
PTH: 137 pg/mL — ABNORMAL HIGH (ref 15–65)

## 2021-08-28 LAB — MAGNESIUM: Magnesium: 1.9 mg/dL (ref 1.7–2.4)

## 2021-08-28 MED ORDER — FUROSEMIDE 10 MG/ML IJ SOLN
80.0000 mg | Freq: Three times a day (TID) | INTRAMUSCULAR | Status: DC
  Administered 2021-08-28 – 2021-08-29 (×3): 80 mg via INTRAVENOUS
  Filled 2021-08-28 (×3): qty 8

## 2021-08-28 NOTE — Progress Notes (Signed)
Progress Note   Patient: Robert Lucas AYT:[id number] DOB: 10/10/1940 DOA: 08/25/2021     3 DOS: the patient was seen and examined on 08/28/2021   Brief hospital course: Mr. Broker was admitted to the hospital with the working diagnosis of acute on chronic heart failure decompensation in the setting of CKD stage IV.   81 yo male with the past medical history of chronic kidney disease stage IV, CAD, heart failure, T2DM, hypertension and Bell's palsy who presented with dyspnea. Reported one week of worsening dyspnea, associated with PND and orthopnea. Positive lower extremity edema and increase abdominal girth. On his initial physical examination his blood pressure was 177/85, HR 67, RR 24, oxygen saturation 95%, his lungs had rales on auscultation, heart with S1 and S2 present and rhythmic, no gallops or rubs, no murmurs, abdomen distended and positive lower extremity edema.   Na 141, K 3,8, Cl 109, bicarb 19, glucose 182, bun 55, cr 4,53  Bnp 1,966 High sensitive troponin 42 and 43  Wbc 5,8, hgb 8,7 hct 27 and plt 163  Sars covid 19 negative   Chest radiograph with moderate cardiomegaly with bilateral hilar vascular congestion, and small bilateral pleural effusions.   EKG with 75 bpm, right axis, with normal intervals, sinus rhythm with PAC and PVC, with q wave in V1 and V2, with no significant ST segment or T wave changes.   Patient was placed on furosemide for diuresis with good response.  Slowly improving but continue need of supplemental 02 per [place]     Assessment and Plan: * Acute on chronic systolic CHF (congestive heart failure) ([place])- (present on admission) Echocardiogram with EF 30 to 35% with mild dilatation of ventricular cavity, preserved RV systolic function with severe elevation in pulmonary systolic pressure 77 mmHg.   Urine output over last 24 hrs is 1,850 ml  On aggressive diuresis with furosemide. Continue with metoprolol for heart failure therapy.   Not able to  use Raas inhibition due to risk of worsening renal function.    Acute renal failure superimposed on stage 4 chronic kidney disease ([place])- (present on admission) Hypomagnesemia/ hypokalemia/ non anion gap metabolic acidosis.   Slowly improving his volume status. Today his serum cr is 4.7 with K at 3,9 and serum bicarbonate at 20 with P at 4.9   Plan to continue diuresis with furosemide 80 mg IV q12 hrs Continue with phosphate binders with calcium acetate. Continue with calcitriol for metabolic bone disease.  Oral sodium bicarbonate for acidosis.    Follow up renal function in am. Acute hypoxemic respiratory failure due to volume overload:  wean off supplemental 02 as tolerated to target 02 saturation of 92% or greater.   Anemia of chronic renal failure- (present on admission) hgb at 8,1 with hct at 24,3. Iron panel with serum iron of 44, tibc 266, transferrin saturation 17 and ferritin 87. Consistent in anemia of chronic disease in combination of iron deficiency.  Continue IV iron and follow up iron panel as outpatient   Essential hypertension- (present on admission) Blood pressure control with amlodipine, hydralazine,  isosorbide and metoprolol Continue diuresis with furosemide.   Coronary artery disease involving native coronary artery of native heart without angina pectoris- (present on admission) Elevated cardiac troponin due to heart failure. Ruled out acute coronary syndrome.  No chest pain, echocardiogram with no wall motion abnormalities.   Type 2 diabetes mellitus with hyperlipidemia ([place])- (present on admission)  His glucose has been stable with capillary 128, 133, fasting this  am 113. Will discontinue insulin therapy and continue capillary glucose monitoring as needed.  Continue with atorvastatin.   Gout- (present on admission) No acute flare.         Subjective: Patient is feeling better, edema and dyspnea continue to improve but not back to baseline, continue  to use supplemental 02 per [place]   Physical Exam: Vitals:   08/28/21 0538 08/28/21 0810 08/28/21 0853 08/28/21 1108  BP: (!) 152/79 (!) 149/67  132/70  Pulse: 75 70 71 70  Resp: 20 20  20   Temp: 97.8 F (36.6 C) 97.6 F (36.4 C)  98.1 F (36.7 C)  TempSrc: Oral Oral  Oral  SpO2: 96% (!) 87%  95%  Weight: 72.3 kg     Height:       Neurology awake and alert ENT with no pallor Cardiovascular with S1 and S2 present and rhythmic, with no gallops, murmurs or rubs No JVD Positive lower extremity edema + bilaterally Respiratory with bibasilar rales but no wheezing or rhonchi Abdomen soft and non tender   Data Reviewed:    Family Communication: no family at the bedside   Disposition: Status is: Inpatient Remains inpatient appropriate because: renal failure and volume overload       Planned Discharge Destination: Home     Author: Tawni Millers, MD 08/28/2021 11:38 AM  For on call review [email or web address].

## 2021-08-28 NOTE — Progress Notes (Signed)
Patient ID: Robert Lucas, male   DOB: [birth year], 81 y.o.   MRN: [id number] S:Feeling better but still requiring oxygen. O:BP (!) 149/67 (BP Location: Right Arm)    Pulse 71    Temp 97.6 F (36.4 C) (Oral)    Resp 20    Ht 5\' 3"  (1.6 m)    Wt 72.3 kg    SpO2 (!) 87%    BMI 28.24 kg/m   Intake/Output Summary (Last 24 hours) at 08/28/2021 0948 Last data filed at 08/28/2021 0500 Gross per 24 hour  Intake 465 ml  Output 1350 ml  Net -885 ml   Intake/Output: I/O last 3 completed shifts: In: 726 [P.O.:660; IV Piggyback:105] Out: 2725 [Urine:2725]  Intake/Output this shift:  No intake/output data recorded. Weight change: 0.3 kg Gen:NAD CVS: RRR Resp:CTA Abd: +BS, soft, NT/ND Ext: 1+ pretibial edema  Recent Labs  Lab 08/25/21 1356 08/26/21 0050 08/27/21 0109 08/28/21 0037  NA 141 139 136 135  K 3.8 3.6 3.4* 3.9  CL 109 111 105 104  CO2 19* 18* 18* 20*  GLUCOSE 182* 180* 148* 113*  BUN 55* 52* 52* 56*  CREATININE 4.53* 4.49* 4.58* 4.70*  ALBUMIN  --  3.1* 2.8* 2.9*  CALCIUM 8.3* 8.1* 7.8* 7.9*  PHOS  --  5.0* 5.4* 4.9*  AST  --  22  --   --   ALT  --  21  --   --    Liver Function Tests: Recent Labs  Lab 08/26/21 0050 08/27/21 0109 08/28/21 0037  AST 22  --   --   ALT 21  --   --   ALKPHOS 74  --   --   BILITOT 1.3*  --   --   PROT 6.1*  --   --   ALBUMIN 3.1* 2.8* 2.9*   No results for input(s): LIPASE, AMYLASE in the last 168 hours. No results for input(s): AMMONIA in the last 168 hours. CBC: Recent Labs  Lab 08/25/21 1356 08/26/21 0050 08/27/21 0109  WBC 5.8 5.6 5.0  NEUTROABS 4.5  --   --   HGB 8.7* 8.4* 8.1*  HCT 27.0* 25.8* 24.3*  MCV 96.1 95.6 94.9  PLT 163 161 156   Cardiac Enzymes: No results for input(s): CKTOTAL, CKMB, CKMBINDEX, TROPONINI in the last 168 hours. CBG: Recent Labs  Lab 08/27/21 0625 08/27/21 1126 08/27/21 1632 08/27/21 2112 08/28/21 0643  GLUCAP 137* 163* 177* 149* 128*    Iron Studies:  Recent Labs     08/27/21 0109  IRON 44*  TIBC 266  FERRITIN 87   Studies/Results: ECHOCARDIOGRAM COMPLETE  Result Date: 08/26/2021    ECHOCARDIOGRAM REPORT   Patient Name:   Robert Lucas Date of Exam: 08/26/2021 Medical Rec #:  [id number]       Height:       63.0 in Accession #:    [id number]      Weight:       160.0 lb Date of Birth:  1941/03/19      BSA:          1.759 m Patient Age:    61 years        BP:           159/67 mmHg Patient Gender: M               HR:           83 bpm. Exam Location:  Inpatient Procedure: 2D Echo Indications:  CHF  History:        Patient has prior history of Echocardiogram examinations, most                 recent 07/10/2020. CHF, CAD; Risk Factors:Diabetes and                 Hypertension.  Sonographer:    Jefferey Pica Referring Phys: [id number] [place]  1. Left ventricular ejection fraction, by estimation, is 30 to 35%. The left ventricle has moderately decreased function. The left ventricle has no regional wall motion abnormalities. The left ventricular internal cavity size was mildly dilated. Left ventricular diastolic parameters are consistent with Grade II diastolic dysfunction (pseudonormalization).  2. Right ventricular systolic function is normal. The right ventricular size is normal. There is severely elevated pulmonary artery systolic pressure. The estimated right ventricular systolic pressure is 62.5 mmHg.  3. Left atrial size was moderately dilated.  4. The mitral valve is normal in structure. No evidence of mitral valve regurgitation. No evidence of mitral stenosis.  5. Tricuspid valve regurgitation is mild to moderate.  6. The aortic valve is tricuspid. There is mild calcification of the aortic valve. There is mild thickening of the aortic valve. Aortic valve regurgitation is not visualized. Aortic valve sclerosis is present, with no evidence of aortic valve stenosis.  7. The inferior vena cava is dilated in size with <50% respiratory variability,  suggesting right atrial pressure of 15 mmHg. FINDINGS  Left Ventricle: Left ventricular ejection fraction, by estimation, is 30 to 35%. The left ventricle has moderately decreased function. The left ventricle has no regional wall motion abnormalities. The left ventricular internal cavity size was mildly dilated. There is no left ventricular hypertrophy. Left ventricular diastolic parameters are consistent with Grade II diastolic dysfunction (pseudonormalization).  LV Wall Scoring: The mid and distal anterior septum, inferior septum, mid anterior segment, and apex are akinetic. Right Ventricle: The right ventricular size is normal. No increase in right ventricular wall thickness. Right ventricular systolic function is normal. There is severely elevated pulmonary artery systolic pressure. The tricuspid regurgitant velocity is 3.96 m/s, and with an assumed right atrial pressure of 15 mmHg, the estimated right ventricular systolic pressure is 63.8 mmHg. Left Atrium: Left atrial size was moderately dilated. Right Atrium: Right atrial size was normal in size. Pericardium: There is no evidence of pericardial effusion. Mitral Valve: The mitral valve is normal in structure. No evidence of mitral valve regurgitation. No evidence of mitral valve stenosis. Tricuspid Valve: The tricuspid valve is normal in structure. Tricuspid valve regurgitation is mild to moderate. No evidence of tricuspid stenosis. Aortic Valve: The aortic valve is tricuspid. There is mild calcification of the aortic valve. There is mild thickening of the aortic valve. Aortic valve regurgitation is not visualized. Aortic valve sclerosis is present, with no evidence of aortic valve stenosis. Aortic valve peak gradient measures 17.4 mmHg. Pulmonic Valve: The pulmonic valve was normal in structure. Pulmonic valve regurgitation is not visualized. No evidence of pulmonic stenosis. Aorta: The aortic root is normal in size and structure. Venous: The inferior vena  cava is dilated in size with less than 50% respiratory variability, suggesting right atrial pressure of 15 mmHg. IAS/Shunts: No atrial level shunt detected by color flow Doppler.  LEFT VENTRICLE PLAX 2D LVIDd:         5.70 cm LVIDs:         3.70 cm LV PW:         1.30 cm  LV IVS:        1.10 cm LVOT diam:     2.10 cm LV SV:         117 LV SV Index:   67 LVOT Area:     3.46 cm  LV Volumes (MOD) LV vol d, MOD A4C: 153.0 ml LV vol s, MOD A4C: 111.0 ml LV SV MOD A4C:     153.0 ml RIGHT VENTRICLE             IVC RV Basal diam:  3.60 cm     IVC diam: 2.80 cm RV Mid diam:    3.40 cm RV S prime:     14.20 cm/s TAPSE (M-mode): 2.4 cm LEFT ATRIUM             Index        RIGHT ATRIUM           Index LA diam:        4.55 cm 2.59 cm/m   RA Area:     14.60 cm LA Vol (A2C):   90.7 ml 51.57 ml/m  RA Volume:   37.10 ml  21.10 ml/m LA Vol (A4C):   95.9 ml 54.53 ml/m LA Biplane Vol: 98.2 ml 55.84 ml/m  AORTIC VALVE                  PULMONIC VALVE AV Area (Vmax): 2.67 cm      PV Vmax:       1.04 m/s AV Vmax:        208.50 cm/s   PV Peak grad:  4.3 mmHg AV Peak Grad:   17.4 mmHg LVOT Vmax:      161.00 cm/s LVOT Vmean:     104.000 cm/s LVOT VTI:       0.338 m  AORTA Ao Root diam: 3.50 cm Ao Asc diam:  3.70 cm MITRAL VALVE                TRICUSPID VALVE MV Area (PHT): 5.66 cm     TR Peak grad:   62.7 mmHg MV Decel Time: 134 msec     TR Vmax:        396.00 cm/s MV E velocity: 112.00 cm/s MV A velocity: 50.70 cm/s   SHUNTS MV E/A ratio:  2.21         Systemic VTI:  0.34 m                             Systemic Diam: 2.10 cm Candee Furbish MD Electronically signed by Candee Furbish MD Signature Date/Time: 08/26/2021/4:45:31 PM    Final     allopurinol  50 mg Oral QODAY   amLODipine  2.5 mg Oral Daily   aspirin EC  81 mg Oral QPM   atorvastatin  40 mg Oral QHS   calcitRIOL  0.25 mcg Oral Q M,W,F   calcium acetate  667 mg Oral TID WC   finasteride  5 mg Oral QPM   furosemide  80 mg Intravenous BID   heparin  5,000 Units Subcutaneous  Q8H   hydrALAZINE  50 mg Oral BID   insulin aspart  0-6 Units Subcutaneous TID WC   isosorbide dinitrate  20 mg Oral BID   metoprolol succinate  50 mg Oral Daily   potassium chloride  20 mEq Oral BID   sodium bicarbonate  650 mg Oral BID   sodium chloride flush  3 mL Intravenous  Q12H   tamsulosin  0.4 mg Oral QPC supper    BMET    Component Value Date/Time   NA 135 08/28/2021 0037   K 3.9 08/28/2021 0037   CL 104 08/28/2021 0037   CO2 20 (L) 08/28/2021 0037   GLUCOSE 113 (H) 08/28/2021 0037   BUN 56 (H) 08/28/2021 0037   CREATININE 4.70 (H) 08/28/2021 0037   CALCIUM 7.9 (L) 08/28/2021 0037   GFRNONAA 12 (L) 08/28/2021 0037   GFRAA 26 (L) 05/07/2019 0234   CBC    Component Value Date/Time   WBC 5.0 08/27/2021 0109   RBC 2.56 (L) 08/27/2021 0109   HGB 8.1 (L) 08/27/2021 0109   HCT 24.3 (L) 08/27/2021 0109   PLT 156 08/27/2021 0109   MCV 94.9 08/27/2021 0109   MCH 31.6 08/27/2021 0109   MCHC 33.3 08/27/2021 0109   RDW 15.3 08/27/2021 0109   LYMPHSABS 0.5 (L) 08/25/2021 1356   MONOABS 0.5 08/25/2021 1356   EOSABS 0.2 08/25/2021 1356   BASOSABS 0.0 08/25/2021 1356      Assessment/Plan:  Acute on chronic diastolic and systolic CHF - given IV lasix 80 mg x 2 doses.  Will follow UOP but may need to increase dose or frequency.  He reports that he is feeling much better since being placed on supplemental oxygen.   Repeat ECHO showed improved EF to 30-35% but grade II DD. Increase furosemide to 80 mg IV tid due to ongoing oxygen requirements.  CKD stage V - Scr is stable to improved and has no uremic symptoms.  Would only initiate HD if he wasn't responding to diuretics or developed worsening renal function and uremia.  Will continue to follow for now.  BUN/Cr at baseline. Anemia of CKD stage V - s/p retacrit injection 20,000 units SQ on 08/10/21.  Will check iron stores and follow.  Transfuse prn <7.  TSAT 17% will order IV venofer 100 mg and follow. Hypokalemia - replete and  follow. CKD-BMD - continue with calcitriol, will add calcium acetate due to low Ca and high phos. CAD - no chest pain. BPH - no issues with urination.  DM type 2 - per primary Disposition - not able to be discharged until he is off of supplemental oxygen.   Donetta Potts, MD [employer] [phone]

## 2021-08-28 NOTE — Evaluation (Signed)
Occupational Therapy Evaluation Patient Details Name: Robert Lucas MRN: [id number] DOB: Jun 22, 1941 Today's Date: 08/28/2021   History of Present Illness 81 yo male presenting ot ED on 2/23 with dyspnea. Positive lower extremity edema and increase abdominal girth. Working diagnosis of acute on chronic heart failure decompensation. PMH including chronic kidney disease stage IV, CAD, heart failure, T2DM, HTN, and Bell's palsy.   Clinical Impression   PTA, pt was living with his wife and was independent; resides at [facility]. Pt currently requiring Supervision for ADLs and functional mobility. Presenting with decreased activity tolerance as seen by fatigue, SOB, and requiring rest breaks. Pt very motivated to return back to PLOF. Pt would benefit from further acute OT to facilitate safe dc. Recommend dc to home once medically stable per physician.       Recommendations for follow up therapy are one component of a multi-disciplinary discharge planning process, led by the attending physician.  Recommendations may be updated based on patient status, additional functional criteria and insurance authorization.   Follow Up Recommendations  No OT follow up    Assistance Recommended at Discharge Intermittent Supervision/Assistance  Patient can return home with the following      Functional Status Assessment     Equipment Recommendations  None recommended by OT    Recommendations for Other Services       Precautions / Restrictions Precautions Precautions: Other (comment) Precaution Comments: Watch vitals      Mobility Bed Mobility Overal bed mobility: Independent                  Transfers Overall transfer level: Independent                        Balance Overall balance assessment: No apparent balance deficits (not formally assessed)                                         ADL either performed or assessed with clinical judgement   ADL Overall  ADL's : Needs assistance/impaired                                       General ADL Comments: Pt performing ADLs and functional mobility at Supervision level. Pt completing functional mobility in hallway; requiring one standing rest break due to fatigue and SOB.     Vision Baseline Vision/History: 1 Wears glasses       Perception     Praxis      Pertinent Vitals/Pain Pain Assessment Pain Assessment: No/denies pain     Hand Dominance Right   Extremity/Trunk Assessment Upper Extremity Assessment Upper Extremity Assessment: Overall WFL for tasks assessed   Lower Extremity Assessment Lower Extremity Assessment: Defer to PT evaluation   Cervical / Trunk Assessment Cervical / Trunk Assessment: Normal   Communication Communication Communication: No difficulties   Cognition Arousal/Alertness: Awake/alert Behavior During Therapy: WFL for tasks assessed/performed Overall Cognitive Status: Within Functional Limits for tasks assessed                                       General Comments  VSS on RA. HR 70-100. RR 10-20s. SpO2 >88%    Exercises  Shoulder Instructions      Home Living Family/patient expects to be discharged to:: Private residence Living Arrangements: Spouse/significant other Available Help at Discharge: Family Type of Home: Independent living facility Home Access: Level entry     Home Layout: One level     Bathroom Shower/Tub: Occupational psychologist: Standard     Home Equipment: Advice worker (2 wheels);Cane - single point;Grab bars - toilet;Grab bars - tub/shower;Hand held shower head          Prior Functioning/Environment Prior Level of Function : Independent/Modified Independent;Driving             Mobility Comments: Does not use DME ADLs Comments: Retired. Enjoys playing golf (though hasnt played in about a year). ADLs, IADLs, and driving        OT Problem List:  Decreased activity tolerance;Decreased knowledge of precautions      OT Treatment/Interventions: Self-care/ADL training;Therapeutic exercise;Energy conservation;DME and/or AE instruction;Therapeutic activities;Patient/family education    OT Goals(Current goals can be found in the care plan section) Acute Rehab OT Goals Patient Stated Goal: Go home soon OT Goal Formulation: With patient Time For Goal Achievement: 09/11/21 Potential to Achieve Goals: Good  OT Frequency: Min 2X/week    Co-evaluation              AM-PAC OT "6 Clicks" Daily Activity     Outcome Measure Help from another person eating meals?: None Help from another person taking care of personal grooming?: A Little Help from another person toileting, which includes using toliet, bedpan, or urinal?: A Little Help from another person bathing (including washing, rinsing, drying)?: A Little Help from another person to put on and taking off regular upper body clothing?: None Help from another person to put on and taking off regular lower body clothing?: A Little 6 Click Score: 20   End of Session Equipment Utilized During Treatment: Gait belt Nurse Communication: Mobility status  Activity Tolerance: Patient tolerated treatment well Patient left: in bed;with call bell/phone within reach  OT Visit Diagnosis: Unsteadiness on feet (R26.81);Other abnormalities of gait and mobility (R26.89);Muscle weakness (generalized) (M62.81)                Time: 2637-8588 OT Time Calculation (min): 19 min Charges:  OT General Charges $OT Visit: 1 Visit OT Evaluation $OT Eval Low Complexity: [place], OTR/L Acute Rehab Pager: [phone] Office: [place] 08/28/2021, 3:24 PM

## 2021-08-29 LAB — RENAL FUNCTION PANEL
Albumin: 2.8 g/dL — ABNORMAL LOW (ref 3.5–5.0)
Anion gap: 11 (ref 5–15)
BUN: 56 mg/dL — ABNORMAL HIGH (ref 8–23)
CO2: 19 mmol/L — ABNORMAL LOW (ref 22–32)
Calcium: 8.3 mg/dL — ABNORMAL LOW (ref 8.9–10.3)
Chloride: 105 mmol/L (ref 98–111)
Creatinine, Ser: 4.99 mg/dL — ABNORMAL HIGH (ref 0.61–1.24)
GFR, Estimated: 11 mL/min — ABNORMAL LOW (ref >60)
Glucose, Bld: 125 mg/dL — ABNORMAL HIGH (ref 70–99)
Phosphorus: 5.1 mg/dL — ABNORMAL HIGH (ref 2.5–4.6)
Potassium: 4.2 mmol/L (ref 3.5–5.1)
Sodium: 135 mmol/L (ref 135–145)

## 2021-08-29 LAB — GLUCOSE, CAPILLARY
Glucose-Capillary: 130 mg/dL — ABNORMAL HIGH (ref 70–99)
Glucose-Capillary: 163 mg/dL — ABNORMAL HIGH (ref 70–99)
Glucose-Capillary: 161 mg/dL — ABNORMAL HIGH (ref 70–99)

## 2021-08-29 MED ORDER — GUAIFENESIN-DM 100-10 MG/5ML PO SYRP
5.0000 mL | ORAL_SOLUTION | ORAL | Status: DC | PRN
  Administered 2021-08-29: 5 mL via ORAL
  Filled 2021-08-29: qty 5

## 2021-08-29 MED ORDER — FUROSEMIDE 40 MG PO TABS
80.0000 mg | ORAL_TABLET | Freq: Two times a day (BID) | ORAL | Status: DC
  Administered 2021-08-29 – 2021-08-30 (×2): 80 mg via ORAL
  Filled 2021-08-29 (×2): qty 2

## 2021-08-29 NOTE — Care Management Important Message (Signed)
Important Message  Patient Details  Name: Robert Lucas MRN: [id number] Date of Birth: [birth year]   Medicare Important Message Given:  Yes     [name] 08/29/2021, 2:30 PM

## 2021-08-29 NOTE — Progress Notes (Signed)
Patient ID: Robert Lucas, male   DOB: 1941/06/17, 81 y.o.   MRN: [id number] S: Now on RA but yet to ambulate On lasix 80 IV TID, 2.6L UOP yest, neg 4.9L since admit weight down 1.5kg.   SCr stable 4.99, K 4.2 Feels good Home lasix dosing 40 qAM PRN  O:BP (!) 142/70    Pulse 75    Temp 98.1 F (36.7 C) (Oral)    Resp (!) 23    Ht 5\' 3"  (1.6 m)    Wt 71 kg    SpO2 93%    BMI 27.73 kg/m   Intake/Output Summary (Last 24 hours) at 08/29/2021 0857 Last data filed at 08/29/2021 0700 Gross per 24 hour  Intake 363 ml  Output 2550 ml  Net -2187 ml    Intake/Output: I/O last 3 completed shifts: In: 363 [P.O.:360; I.V.:3] Out: 3300 [Urine:3300]  Intake/Output this shift:  No intake/output data recorded. Weight change: -1.3 kg Gen:NAD CVS: RRR Resp:CTA Abd: +BS, soft, NT/ND Ext: No LEE LUE AVF +B/T  Recent Labs  Lab 08/25/21 1356 08/26/21 0050 08/27/21 0109 08/28/21 0037 08/29/21 0116  NA 141 139 136 135 135  K 3.8 3.6 3.4* 3.9 4.2  CL 109 111 105 104 105  CO2 19* 18* 18* 20* 19*  GLUCOSE 182* 180* 148* 113* 125*  BUN 55* 52* 52* 56* 56*  CREATININE 4.53* 4.49* 4.58* 4.70* 4.99*  ALBUMIN  --  3.1* 2.8* 2.9* 2.8*  CALCIUM 8.3* 8.1*   7.6* 7.8* 7.9* 8.3*  PHOS  --  5.0* 5.4* 4.9* 5.1*  AST  --  22  --   --   --   ALT  --  21  --   --   --     Liver Function Tests: Recent Labs  Lab 08/26/21 0050 08/27/21 0109 08/28/21 0037 08/29/21 0116  AST 22  --   --   --   ALT 21  --   --   --   ALKPHOS 74  --   --   --   BILITOT 1.3*  --   --   --   PROT 6.1*  --   --   --   ALBUMIN 3.1* 2.8* 2.9* 2.8*    No results for input(s): LIPASE, AMYLASE in the last 168 hours. No results for input(s): AMMONIA in the last 168 hours. CBC: Recent Labs  Lab 08/25/21 1356 08/26/21 0050 08/27/21 0109  WBC 5.8 5.6 5.0  NEUTROABS 4.5  --   --   HGB 8.7* 8.4* 8.1*  HCT 27.0* 25.8* 24.3*  MCV 96.1 95.6 94.9  PLT 163 161 156    Cardiac Enzymes: No results for input(s): CKTOTAL,  CKMB, CKMBINDEX, TROPONINI in the last 168 hours. CBG: Recent Labs  Lab 08/28/21 0643 08/28/21 1110 08/28/21 1606 08/28/21 2119 08/29/21 0626  GLUCAP 128* 133* 143* 140* 130*     Iron Studies:  Recent Labs    08/27/21 0109  IRON 44*  TIBC 266  FERRITIN 87    Studies/Results: No results found.  allopurinol  50 mg Oral QODAY   amLODipine  2.5 mg Oral Daily   aspirin EC  81 mg Oral QPM   atorvastatin  40 mg Oral QHS   calcitRIOL  0.25 mcg Oral Q M,W,F   calcium acetate  667 mg Oral TID WC   finasteride  5 mg Oral QPM   furosemide  80 mg Intravenous Q8H   heparin  5,000 Units Subcutaneous Q8H  hydrALAZINE  50 mg Oral BID   isosorbide dinitrate  20 mg Oral BID   metoprolol succinate  50 mg Oral Daily   potassium chloride  20 mEq Oral BID   sodium bicarbonate  650 mg Oral BID   sodium chloride flush  3 mL Intravenous Q12H   tamsulosin  0.4 mg Oral QPC supper    BMET    Component Value Date/Time   NA 135 08/29/2021 0116   K 4.2 08/29/2021 0116   CL 105 08/29/2021 0116   CO2 19 (L) 08/29/2021 0116   GLUCOSE 125 (H) 08/29/2021 0116   BUN 56 (H) 08/29/2021 0116   CREATININE 4.99 (H) 08/29/2021 0116   CALCIUM 8.3 (L) 08/29/2021 0116   CALCIUM 7.6 (L) 08/26/2021 0050   GFRNONAA 11 (L) 08/29/2021 0116   GFRAA 26 (L) 05/07/2019 0234   CBC    Component Value Date/Time   WBC 5.0 08/27/2021 0109   RBC 2.56 (L) 08/27/2021 0109   HGB 8.1 (L) 08/27/2021 0109   HCT 24.3 (L) 08/27/2021 0109   PLT 156 08/27/2021 0109   MCV 94.9 08/27/2021 0109   MCH 31.6 08/27/2021 0109   MCHC 33.3 08/27/2021 0109   RDW 15.3 08/27/2021 0109   LYMPHSABS 0.5 (L) 08/25/2021 1356   MONOABS 0.5 08/25/2021 1356   EOSABS 0.2 08/25/2021 1356   BASOSABS 0.0 08/25/2021 1356      Assessment/Plan:  Acute on chronic diastolic and systolic CHF - Vol status improving with IV lasix Repeat ECHO showed improved EF to 30-35% but grade II DD. Now on RA, approaching euvolemia Transition to lasix  80 PO BID, if stable consider DC as early as tomorrow CKD stage V - Scr is stable to improved and has no uremic symptoms.  LUE AVF. Would only initiate HD if he wasn't responding to diuretics or developed worsening renal function and uremia.   Anemia of CKD stage V - s/p retacrit injection 20,000 units SQ on 08/10/21.  Will check iron stores and follow.  Transfuse prn <7.  TSAT 17% will order IV venofer 100 mg and follow. Hypokalemia - replete and follow. CKD-BMD - continue with calcitriol, binder added during admission CAD - no chest pain. BPH - no issues with urination.  DM type 2 - per primary Disposition - possibly tomorrow  Rexene Agent, MD  [facility]

## 2021-08-29 NOTE — Progress Notes (Signed)
Progress Note   Patient: Robert Lucas ZOX:[id number] DOB: 11/17/1940 DOA: 08/25/2021     4 DOS: the patient was seen and examined on 08/29/2021   Brief hospital course: Robert Lucas was admitted to the hospital with the working diagnosis of acute on chronic heart failure decompensation in the setting of CKD stage IV.   81 yo male with the past medical history of chronic kidney disease stage IV, CAD, heart failure, T2DM, hypertension and Robert Lucas who presented with dyspnea. Reported one week of worsening dyspnea, associated with PND and orthopnea. Positive lower extremity edema and increase abdominal girth. On his initial physical examination his blood pressure was 177/85, HR 67, RR 24, oxygen saturation 95%, his lungs had rales on auscultation, heart with S1 and S2 present and rhythmic, no gallops or rubs, no murmurs, abdomen distended and positive lower extremity edema.   Na 141, K 3,8, Cl 109, bicarb 19, glucose 182, bun 55, cr 4,53  Bnp 1,966 High sensitive troponin 42 and 43  Wbc 5,8, hgb 8,7 hct 27 and plt 163  Sars covid 19 negative   Chest radiograph with moderate cardiomegaly with bilateral hilar vascular congestion, and small bilateral pleural effusions.   EKG with 75 bpm, right axis, with normal intervals, sinus rhythm with PAC and PVC, with q wave in V1 and V2, with no significant ST segment or T wave changes.   Patient was placed on furosemide for diuresis with good response.  Slowly improving but continue need of supplemental 02 per [facility]   Renal function has been stable with serum cr less than 5. 02/27 transitioned to oral furosemide and plan for possible dc home tomorrow.   Assessment and Plan: * Acute on chronic systolic CHF (congestive heart failure) (HCC)- (present on admission) Echocardiogram with EF 30 to 35% with mild dilatation of ventricular cavity, preserved RV systolic function with severe elevation in pulmonary systolic pressure 77 mmHg.   Urine output over  last 24 hrs is 2,550 ml On metoprolol for heart failure therapy.   Not able to use Raas inhibition due to risk of worsening renal function.   Plan to transition to oral furosemide today.  Acute renal failure superimposed on stage 4 chronic kidney disease ([facility])- (present on admission) Hypomagnesemia/ hypokalemia/ non anion gap metabolic acidosis.   Slowly improving his volume status. Renal function with serum cr less than 5, with K at 4,2 and serum bicarbonate at 19, P is 5,1   Oxymetry is 90% on room air.  Transitioned to oral furosemide  Continue with phosphate binders with calcium acetate. On calcitriol for metabolic bone disease.  Oral sodium bicarbonate for acidosis.    Follow up renal function in am.   Anemia of chronic renal failure- (present on admission) Iron panel with serum iron of 44, tibc 266, transferrin saturation 17 and ferritin 87. Consistent in anemia of chronic disease in combination of iron deficiency.  Continue IV iron and follow up iron panel as outpatient  Follow up cell count as outpatient.   Essential hypertension- (present on admission) Systolic blood pressure 811 to 160 mmHg  Continue with amlodipine, hydralazine,  isosorbide and metoprolol for blood pressure control. Continue diuresis with furosemide.   Coronary artery disease involving native coronary artery of native heart without angina pectoris- (present on admission) Elevated cardiac troponin due to heart failure. Ruled out acute coronary syndrome.  No chest pain, echocardiogram with no wall motion abnormalities.   Type 2 diabetes mellitus with hyperlipidemia ([facility])- (present on admission)  His glucose has been stable  Continue to hold on insulin therapy.  Continue with atorvastatin.   Gout- (present on admission) No acute flare.         Subjective: Patient is feeling better, dyspnea and edema continue to improve. No nausea or vomiting, continue with intermittent cough.   Physical  Exam: Vitals:   08/29/21 0413 08/29/21 0755 08/29/21 1109 08/29/21 1119  BP: (!) 160/70 (!) 142/70 (!) 149/76   Pulse: 72 75 71 74  Resp: 20 (!) 23 (!) 27 17  Temp: 98.1 F (36.7 C) 98.1 F (36.7 C) 98.1 F (36.7 C)   TempSrc: Oral Oral Oral   SpO2: 90% 93% 92% 92%  Weight: 71 kg     Height:       Neurology awake and alert ENT with no pallor Cardiovascular with S1 and S2 rhythmic with no gallops or murmurs No JVD Trace lower extremity edema Respiratory with no rales or wheezing  Abdomen soft   Data Reviewed:    Family Communication: no family at the bedside   Disposition: Status is: Inpatient Remains inpatient appropriate because: renal failure with plan for dc home tomorrow      Planned Discharge Destination: Home   Author: Tawni Millers, MD 08/29/2021 3:51 PM  For on call review [email or web address].

## 2021-08-29 NOTE — Progress Notes (Signed)
Mobility Specialist Criteria Algorithm Info.    08/29/21 1100  Mobility  Activity Ambulated independently to bathroom;Dangled on edge of bed;Refused mobility (Declined further mobility)  Range of Motion/Exercises Active;All extremities  Level of Assistance Independent  Assistive Device None  Distance Ambulated (ft) 20 ft  Activity Response Tolerated well   Patient received dangling EOB. Ambulated short distance in room to bathroom then returned to bed, declined further ambulation. Explained he walked with PT earlier and would like to rest. Was left with all needs met, call bell in reach.  08/29/2021 11:00 AM  Robert Lucas, Guys, [place]  XAJOI:[phone] Office: [phone]

## 2021-08-29 NOTE — Evaluation (Signed)
Physical Therapy Evaluation and Discharge Patient Details Name: Robert Lucas MRN: [id number] DOB: 07-Sep-1940 Today's Date: 08/29/2021  History of Present Illness  81 yo male presenting ot ED on 2/23 with dyspnea. Positive lower extremity edema and increase abdominal girth. Working diagnosis of acute on chronic heart failure decompensation. PMH including chronic kidney disease stage IV, CAD, heart failure, T2DM, HTN, and Bell's palsy.  Clinical Impression  Patient evaluated by Physical Therapy with no further acute PT needs identified. Pt reports decreased cardiopulmonary endurance in comparison to baseline. Ambulating 500 feet with no assistive device without physical difficulty. SpO2 90-95% on RA, HR 75. Education provided regarding activity recommendations and progression. All education has been completed and the patient has no further questions. No follow-up Physical Therapy or equipment needs. PT is signing off. Thank you for this referral.      Recommendations for follow up therapy are one component of a multi-disciplinary discharge planning process, led by the attending physician.  Recommendations may be updated based on patient status, additional functional criteria and insurance authorization.  Follow Up Recommendations No PT follow up    Assistance Recommended at Discharge PRN  Patient can return home with the following       Equipment Recommendations None recommended by PT  Recommendations for Other Services       Functional Status Assessment Patient has had a recent decline in their functional status and demonstrates the ability to make significant improvements in function in a reasonable and predictable amount of time.     Precautions / Restrictions Precautions Precautions: None Restrictions Weight Bearing Restrictions: No      Mobility  Bed Mobility Overal bed mobility: Independent                  Transfers Overall transfer level:  Independent Equipment used: None                    Ambulation/Gait Ambulation/Gait assistance: Modified independent (Device/Increase time) Gait Distance (Feet): 500 Feet Assistive device: None Gait Pattern/deviations: WFL(Within Functional Limits)          Stairs            Wheelchair Mobility    Modified Rankin (Stroke Patients Only)       Balance Overall balance assessment: Mild deficits observed, not formally tested                                           Pertinent Vitals/Pain Pain Assessment Pain Assessment: No/denies pain    Home Living Family/patient expects to be discharged to:: Private residence Living Arrangements: Spouse/significant other Available Help at Discharge: Family Type of Home: Independent living facility Home Access: Level entry       Home Layout: One level Home Equipment: Advice worker (2 wheels);Cane - single point;Grab bars - toilet;Grab bars - tub/shower;Hand held shower head      Prior Function Prior Level of Function : Independent/Modified Independent;Driving             Mobility Comments: Does not use DME ADLs Comments: Retired. Enjoys playing golf (though hasnt played in about a year). ADLs, IADLs, and driving     Hand Dominance   Dominant Hand: Right    Extremity/Trunk Assessment   Upper Extremity Assessment Upper Extremity Assessment: Defer to OT evaluation    Lower Extremity Assessment Lower Extremity Assessment: Overall WFL for tasks  assessed    Cervical / Trunk Assessment Cervical / Trunk Assessment: Normal  Communication   Communication: No difficulties  Cognition Arousal/Alertness: Awake/alert Behavior During Therapy: WFL for tasks assessed/performed Overall Cognitive Status: Within Functional Limits for tasks assessed                                          General Comments      Exercises     Assessment/Plan    PT Assessment  Patient does not need any further PT services  PT Problem List Decreased activity tolerance;Decreased mobility;Cardiopulmonary status limiting activity       PT Treatment Interventions      PT Goals (Current goals can be found in the Care Plan section)  Acute Rehab PT Goals Patient Stated Goal: go home PT Goal Formulation: All assessment and education complete, DC therapy    Frequency       Co-evaluation               AM-PAC PT "6 Clicks" Mobility  Outcome Measure Help needed turning from your back to your side while in a flat bed without using bedrails?: None Help needed moving from lying on your back to sitting on the side of a flat bed without using bedrails?: None Help needed moving to and from a bed to a chair (including a wheelchair)?: None Help needed standing up from a chair using your arms (e.g., wheelchair or bedside chair)?: None Help needed to walk in hospital room?: None Help needed climbing 3-5 steps with a railing? : A Little 6 Click Score: 23    End of Session   Activity Tolerance: Patient tolerated treatment well Patient left: in bed;with call bell/phone within reach Nurse Communication: Mobility status PT Visit Diagnosis: Difficulty in walking, not elsewhere classified (R26.2)    Time: 6979-4801 PT Time Calculation (min) (ACUTE ONLY): 18 min   Charges:   PT Evaluation $PT Eval Low Complexity: McGregor, PT, DPT Acute Rehabilitation Services Pager [phone] Office [phone]   Deno Etienne 08/29/2021, 10:01 AM

## 2021-08-30 ENCOUNTER — Other Ambulatory Visit (HOSPITAL_COMMUNITY): Payer: Self-pay

## 2021-08-30 ENCOUNTER — Encounter (HOSPITAL_COMMUNITY): Payer: Self-pay

## 2021-08-30 LAB — RENAL FUNCTION PANEL
Albumin: 3 g/dL — ABNORMAL LOW (ref 3.5–5.0)
Anion gap: 12 (ref 5–15)
BUN: 57 mg/dL — ABNORMAL HIGH (ref 8–23)
CO2: 20 mmol/L — ABNORMAL LOW (ref 22–32)
Calcium: 8.6 mg/dL — ABNORMAL LOW (ref 8.9–10.3)
Chloride: 102 mmol/L (ref 98–111)
Creatinine, Ser: 5.08 mg/dL — ABNORMAL HIGH (ref 0.61–1.24)
GFR, Estimated: 11 mL/min — ABNORMAL LOW (ref >60)
Glucose, Bld: 138 mg/dL — ABNORMAL HIGH (ref 70–99)
Phosphorus: 4.3 mg/dL (ref 2.5–4.6)
Potassium: 4.5 mmol/L (ref 3.5–5.1)
Sodium: 134 mmol/L — ABNORMAL LOW (ref 135–145)

## 2021-08-30 LAB — GLUCOSE, CAPILLARY: Glucose-Capillary: 144 mg/dL — ABNORMAL HIGH (ref 70–99)

## 2021-08-30 MED ORDER — ONDANSETRON HCL 4 MG PO TABS
4.0000 mg | ORAL_TABLET | Freq: Once | ORAL | Status: AC
  Administered 2021-08-30: 4 mg via ORAL
  Filled 2021-08-30: qty 1

## 2021-08-30 MED ORDER — FUROSEMIDE 80 MG PO TABS
80.0000 mg | ORAL_TABLET | Freq: Two times a day (BID) | ORAL | 0 refills | Status: DC
  Filled 2021-08-30: qty 60, 30d supply, fill #0

## 2021-08-30 MED ORDER — CALCIUM ACETATE (PHOS BINDER) 667 MG PO CAPS
667.0000 mg | ORAL_CAPSULE | Freq: Three times a day (TID) | ORAL | 0 refills | Status: AC
  Filled 2021-08-30: qty 90, 30d supply, fill #0

## 2021-08-30 NOTE — Progress Notes (Signed)
Claimed to feel much better. Discharge instructions given to pt. Belongings taken home.

## 2021-08-30 NOTE — Discharge Summary (Signed)
Physician Discharge Summary   Patient: Robert Lucas MRN: [id number] DOB: 09-01-1940  Admit date:     08/25/2021  Discharge date: 08/30/21  Discharge Physician: Jimmy Picket Meighan Treto   PCP: [name], NP   Recommendations at discharge:    Furosemide dose has been increased to 80 mg po bid Added phosphate binders Follow up with nephrology as outpatient, check electrolytes and renal function. Patient has already hemodialysis acces in his left upper extremity (av fistula).   Discharge Diagnoses: Principal Problem:   Acute on chronic systolic CHF (congestive heart failure) (HCC) Active Problems:   Acute renal failure superimposed on stage 4 chronic kidney disease (HCC)   Anemia of chronic renal failure   Essential hypertension   Coronary artery disease involving native coronary artery of native heart without angina pectoris   Type 2 diabetes mellitus with hyperlipidemia ([place])   Gout  Resolved Problems:   * No resolved hospital problems. [facility] Course: Robert Lucas was admitted to the hospital with the working diagnosis of acute on chronic heart failure decompensation in the setting of CKD stage IV.   81 yo male with the past medical history of chronic kidney disease stage IV, CAD, heart failure, T2DM, hypertension and Bell's palsy who presented with dyspnea. Reported one week of worsening dyspnea, associated with PND and orthopnea. Positive lower extremity edema and increase abdominal girth. On his initial physical examination his blood pressure was 177/85, HR 67, RR 24, oxygen saturation 95%, his lungs had rales on auscultation, heart with S1 and S2 present and rhythmic, no gallops or rubs, no murmurs, abdomen distended and positive lower extremity edema.   Na 141, K 3,8, Cl 109, bicarb 19, glucose 182, bun 55, cr 4,53  Bnp 1,966 High sensitive troponin 42 and 43  Wbc 5,8, hgb 8,7 hct 27 and plt 163  Sars covid 19 negative   Chest radiograph with moderate  cardiomegaly with bilateral hilar vascular congestion, and small bilateral pleural effusions.   EKG with 75 bpm, right axis, with normal intervals, sinus rhythm with PAC and PVC, with q wave in V1 and V2, with no significant ST segment or T wave changes.   Patient was placed on furosemide for diuresis with good response.  Slowly improving but continue need of supplemental 02 per [place]   With further diuresis he achieved euvolemic state and decision was made to discharge patient home with increased dose of oral furosemide and close follow up as outpatient.   Assessment and Plan: * Acute on chronic systolic CHF (congestive heart failure) ([place])- (present on admission) Volume overload due to worsening renal function. Patient was placed on IV furosemide with improvement of volume status and symptoms.   At the time of discharge his fluid balance was negative 5,992 ml.   Further work up with echocardiogram with EF 30 to 35% with mild dilatation of ventricular cavity, preserved RV systolic function with severe elevation in pulmonary systolic pressure 77 mmHg.   Not able to use Raas inhibition due to risk of worsening renal function.   Continue blood pressure control with metoprolol, hydralazine, isosorbide and amlodipine.   Acute renal failure superimposed on stage 4 chronic kidney disease ([place])- (present on admission) Hypomagnesemia/ hyponatremia, hypokalemia/ non anion gap metabolic acidosis.   Acute hypoxemic respiratory failure due to volume overload.  Patient was placed on aggressive diuresis with IV furosemide with improvement in volume status, at the time of his discharge he was negative 5,992 ml.   At the time  of his discharge he is no using supplemental 02.   At discharge Na 134, K 4,5. CL 102, bicarbonate 20, glucose 138, bun 57 and cr 5,08.   K was corrected with KCL Plan to continue with phosphate binders with calcium acetate. On calcitriol for metabolic bone disease.  Oral sodium  bicarbonate for acidosis.    Furosemide has been increased to 80 mg po bid and have close follow up as outpatient with nephrology.   Anemia of chronic renal failure- (present on admission) Iron panel with serum iron of 44, tibc 266, transferrin saturation 17 and ferritin 87. Consistent in anemia of chronic disease in combination of iron deficiency.  Continue IV iron and follow up iron panel as outpatient  Follow up cell count as outpatient.   Essential hypertension- (present on admission) His blood pressure remained well controlled during his hospitalization.   Plan to continue with amlodipine, hydralazine,  isosorbide and metoprolol for blood pressure control. Continue diuresis with furosemide, dose increased to 80 mg po bid.   Coronary artery disease involving native coronary artery of native heart without angina pectoris- (present on admission) Elevated cardiac troponin due to heart failure. Ruled out acute coronary syndrome.  No chest pain, echocardiogram with no wall motion abnormalities.   Type 2 diabetes mellitus with hyperlipidemia ([place])- (present on admission) His glucose remained stable during his hospitalization, he did no require insulin therapy.   Continue with atorvastatin.   Gout- (present on admission) No acute flare.            Consultants: nephrology  Procedures performed: none   Disposition: Home Diet recommendation:  Cardiac diet  DISCHARGE MEDICATION: Allergies as of 08/30/2021       Reactions   Lisinopril Cough   Hydrocodone Nausea Only   Liraglutide    GI upset   Prednisone    Elevates BGL; patient prefers to not take this   Sitagliptin    Unknown   Vardenafil    Other reaction(s): flushing   Penicillins Rash, Other (See Comments)   Rash around ankles Has patient had a PCN reaction causing immediate rash, facial/tongue/throat swelling, SOB or lightheadedness with hypotension: Yes Has patient had a PCN reaction causing severe rash  involving mucus membranes or skin necrosis: Unk Has patient had a PCN reaction that required hospitalization: No Has patient had a PCN reaction occurring within the last 10 years: No If all of the above answers are "NO", then may proceed with Cephalosporin use.        Medication List     TAKE these medications    acetaminophen 500 MG tablet Commonly known as: TYLENOL Take 500 mg by mouth every 6 (six) hours as needed for moderate pain or headache.   allopurinol 100 MG tablet Commonly known as: ZYLOPRIM Take 100 mg by mouth daily.   amLODipine 2.5 MG tablet Commonly known as: NORVASC Take 2.5 mg by mouth daily.   aspirin EC 81 MG tablet Take 81 mg by mouth every evening.   atorvastatin 40 MG tablet Commonly known as: LIPITOR Take 40 mg by mouth at bedtime.   calcitRIOL 0.25 MCG capsule Commonly known as: ROCALTROL Take 0.25 mcg by mouth every Monday, Wednesday, and Friday.   calcium acetate 667 MG capsule Commonly known as: PHOSLO Take 1 capsule (667 mg total) by mouth 3 (three) times daily with meals.   finasteride 5 MG tablet Commonly known as: PROSCAR Take 5 mg by mouth every evening.   furosemide 80 MG tablet Commonly known  as: LASIX Take 1 tablet (80 mg total) by mouth 2 (two) times daily. What changed:  medication strength See the new instructions.   hydrALAZINE 50 MG tablet Commonly known as: APRESOLINE Take 1 tablet (50 mg total) by mouth in the morning and at bedtime.   ipratropium 0.06 % nasal spray Commonly known as: ATROVENT Place 2 sprays into both nostrils 4 (four) times daily.   isosorbide dinitrate 20 MG tablet Commonly known as: ISORDIL Take 1 tablet (20 mg total) by mouth 2 (two) times daily.   metoprolol succinate 50 MG 24 hr tablet Commonly known as: TOPROL-XL Take 1 tablet (50 mg total) by mouth daily. Take with or immediately following a meal.   NovoLOG Mix 70/30 FlexPen (70-30) 100 UNIT/ML FlexPen Generic drug: insulin aspart  protamine - aspart Inject 14-20 Units into the skin daily as needed (high blood sugar). If BS is <180=0 units, If BS is 180-200 units=14 units, If BS is >201=20 units   promethazine-dextromethorphan 6.25-15 MG/5ML syrup Commonly known as: PROMETHAZINE-DM Take 5 mLs by mouth every 6 (six) hours as needed.   Retacrit 2000 UNIT/ML injection Generic drug: epoetin alfa-epbx 2,000 Units once. Injects once a month   sodium bicarbonate 650 MG tablet Take 650 mg by mouth 2 (two) times daily.   tamsulosin 0.4 MG Caps capsule Commonly known as: FLOMAX Take 1 capsule (0.4 mg total) by mouth daily after supper. What changed: when to take this   VICKS VAPOINHALER IN Inhale 1 puff into the lungs daily as needed (congestion).        Follow-up Information     Rexene Agent, MD Follow up in 1 week(s).   Specialty: Nephrology Why: We will call with appointment details and lab work. Contact information: [place] [address] [phone]                 Discharge Exam: Filed Weights   08/27/21 0453 08/28/21 0538 08/29/21 0413  Weight: 72 kg 72.3 kg 71 kg   BP (!) 177/75 (BP Location: Right Arm)    Pulse 78    Temp 97.8 F (36.6 C) (Oral)    Resp (!) 25    Ht 5\' 3"  (1.6 m)    Wt 71 kg    SpO2 92%    BMI 27.73 kg/m   Patient is feeling well, no dyspnea or chest pain, lower extremity edema has improved, no PND or orthopnea.  Neurology awake and alert ENT with no pallor Cardiovascular with S1 and S2 present and rhythmic, with no rubs, gallops or murmurs. No JVD No lower extremity edema Respiratory with no wheezing, rales or rhonchi Abdomen soft and non tender  Condition at discharge: stable  The results of significant diagnostics from this hospitalization (including imaging, microbiology, ancillary and laboratory) are listed below for reference.   Imaging Studies: DG Chest 2 View  Result Date: 08/25/2021 CLINICAL DATA:  Shortness of breath EXAM: CHEST -  2 VIEW COMPARISON:  07/08/2020 FINDINGS: Bilateral interstitial thickening. Small left pleural effusion. Trace right pleural effusion. No pneumothorax. Stable cardiomegaly. No acute osseous abnormality. IMPRESSION: 1. Findings concerning for mild CHF. Electronically Signed   By: Kathreen Devoid M.D.   On: 08/25/2021 12:56   VAS US DUPLEX DIALYSIS ACCESS (AVF,AVG)  Result Date: 08/05/2021 DIALYSIS ACCESS Patient Name:  SHIVAAN TIERNO  Date of Exam:   08/05/2021 Medical Rec #: [id number]        Accession #:    [id number] Date of Birth: [birth year]  Patient Gender: M Patient Age:   61 years Exam Location:  [facility] Procedure:      VAS US DUPLEX DIALYSIS ACCESS (AVF, AVG) Referring Phys: EMMA COLLINS --------------------------------------------------------------------------------  Reason for Exam: Non-maturation of AVF. Access Site: Left Upper Extremity. Access Type: Brachial-cephalic AVF. History: Created on 05/17/21. Performing Technologist: Ralene Cork RVT  Examination Guidelines: A complete evaluation includes B-mode imaging, spectral Doppler, color Doppler, and power Doppler as needed of all accessible portions of each vessel. Unilateral testing is considered an integral part of a complete examination. Limited examinations for reoccurring indications may be performed as noted.  Findings: +--------------------+----------+-----------------+--------+  AVF                  PSV (cm/s) Flow Vol (mL/min) Comments  +--------------------+----------+-----------------+--------+  Native artery inflow    156            718                  +--------------------+----------+-----------------+--------+  AVF Anastomosis         637                                 +--------------------+----------+-----------------+--------+  +------------+----------+-------------+----------+-------------+  OUTFLOW VEIN PSV (cm/s) Diameter (cm) Depth (cm)   Describe      +------------+----------+-------------+----------+-------------+  Prox UA         169         0.43         0.49                   +------------+----------+-------------+----------+-------------+  Mid UA          205         0.35         0.38                   +------------+----------+-------------+----------+-------------+  Dist UA         288         0.45         0.35    branch 0.3 cm  +------------+----------+-------------+----------+-------------+  AC Fossa        429         0.44                                +------------+----------+-------------+----------+-------------+  Summary: Patent left brachio-cephalic AVF. No stenosis. Branch in the distal upper arm 0.3cm. Largest outflow diameter of 0.45cm.  *See table(s) above for measurements and observations.  Diagnosing physician: Orlie Pollen Electronically signed by Orlie Pollen on 08/05/2021 at 5:38:51 PM.    --------------------------------------------------------------------------------   Final    ECHOCARDIOGRAM COMPLETE  Result Date: 08/26/2021    ECHOCARDIOGRAM REPORT   Patient Name:   NERO SAWATZKY Date of Exam: 08/26/2021 Medical Rec #:  [id number]       Height:       63.0 in Accession #:    [id number]      Weight:       160.0 lb Date of Birth:  1940-09-01      BSA:          1.759 m Patient Age:    81 years        BP:           159/67 mmHg Patient Gender: M  HR:           83 bpm. Exam Location:  Inpatient Procedure: 2D Echo Indications:    CHF  History:        Patient has prior history of Echocardiogram examinations, most                 recent 07/10/2020. CHF, CAD; Risk Factors:Diabetes and                 Hypertension.  Sonographer:    Jefferey Pica Referring Phys: [id number] [place]  1. Left ventricular ejection fraction, by estimation, is 30 to 35%. The left ventricle has moderately decreased function. The left ventricle has no regional wall motion abnormalities. The left ventricular internal cavity size was  mildly dilated. Left ventricular diastolic parameters are consistent with Grade II diastolic dysfunction (pseudonormalization).  2. Right ventricular systolic function is normal. The right ventricular size is normal. There is severely elevated pulmonary artery systolic pressure. The estimated right ventricular systolic pressure is 38.2 mmHg.  3. Left atrial size was moderately dilated.  4. The mitral valve is normal in structure. No evidence of mitral valve regurgitation. No evidence of mitral stenosis.  5. Tricuspid valve regurgitation is mild to moderate.  6. The aortic valve is tricuspid. There is mild calcification of the aortic valve. There is mild thickening of the aortic valve. Aortic valve regurgitation is not visualized. Aortic valve sclerosis is present, with no evidence of aortic valve stenosis.  7. The inferior vena cava is dilated in size with <50% respiratory variability, suggesting right atrial pressure of 15 mmHg. FINDINGS  Left Ventricle: Left ventricular ejection fraction, by estimation, is 30 to 35%. The left ventricle has moderately decreased function. The left ventricle has no regional wall motion abnormalities. The left ventricular internal cavity size was mildly dilated. There is no left ventricular hypertrophy. Left ventricular diastolic parameters are consistent with Grade II diastolic dysfunction (pseudonormalization).  LV Wall Scoring: The mid and distal anterior septum, inferior septum, mid anterior segment, and apex are akinetic. Right Ventricle: The right ventricular size is normal. No increase in right ventricular wall thickness. Right ventricular systolic function is normal. There is severely elevated pulmonary artery systolic pressure. The tricuspid regurgitant velocity is 3.96 m/s, and with an assumed right atrial pressure of 15 mmHg, the estimated right ventricular systolic pressure is 50.5 mmHg. Left Atrium: Left atrial size was moderately dilated. Right Atrium: Right atrial size  was normal in size. Pericardium: There is no evidence of pericardial effusion. Mitral Valve: The mitral valve is normal in structure. No evidence of mitral valve regurgitation. No evidence of mitral valve stenosis. Tricuspid Valve: The tricuspid valve is normal in structure. Tricuspid valve regurgitation is mild to moderate. No evidence of tricuspid stenosis. Aortic Valve: The aortic valve is tricuspid. There is mild calcification of the aortic valve. There is mild thickening of the aortic valve. Aortic valve regurgitation is not visualized. Aortic valve sclerosis is present, with no evidence of aortic valve stenosis. Aortic valve peak gradient measures 17.4 mmHg. Pulmonic Valve: The pulmonic valve was normal in structure. Pulmonic valve regurgitation is not visualized. No evidence of pulmonic stenosis. Aorta: The aortic root is normal in size and structure. Venous: The inferior vena cava is dilated in size with less than 50% respiratory variability, suggesting right atrial pressure of 15 mmHg. IAS/Shunts: No atrial level shunt detected by color flow Doppler.  LEFT VENTRICLE PLAX 2D LVIDd:         5.70  cm LVIDs:         3.70 cm LV PW:         1.30 cm LV IVS:        1.10 cm LVOT diam:     2.10 cm LV SV:         117 LV SV Index:   67 LVOT Area:     3.46 cm  LV Volumes (MOD) LV vol d, MOD A4C: 153.0 ml LV vol s, MOD A4C: 111.0 ml LV SV MOD A4C:     153.0 ml RIGHT VENTRICLE             IVC RV Basal diam:  3.60 cm     IVC diam: 2.80 cm RV Mid diam:    3.40 cm RV S prime:     14.20 cm/s TAPSE (M-mode): 2.4 cm LEFT ATRIUM             Index        RIGHT ATRIUM           Index LA diam:        4.55 cm 2.59 cm/m   RA Area:     14.60 cm LA Vol (A2C):   90.7 ml 51.57 ml/m  RA Volume:   37.10 ml  21.10 ml/m LA Vol (A4C):   95.9 ml 54.53 ml/m LA Biplane Vol: 98.2 ml 55.84 ml/m  AORTIC VALVE                  PULMONIC VALVE AV Area (Vmax): 2.67 cm      PV Vmax:       1.04 m/s AV Vmax:        208.50 cm/s   PV Peak grad:  4.3  mmHg AV Peak Grad:   17.4 mmHg LVOT Vmax:      161.00 cm/s LVOT Vmean:     104.000 cm/s LVOT VTI:       0.338 m  AORTA Ao Root diam: 3.50 cm Ao Asc diam:  3.70 cm MITRAL VALVE                TRICUSPID VALVE MV Area (PHT): 5.66 cm     TR Peak grad:   62.7 mmHg MV Decel Time: 134 msec     TR Vmax:        396.00 cm/s MV E velocity: 112.00 cm/s MV A velocity: 50.70 cm/s   SHUNTS MV E/A ratio:  2.21         Systemic VTI:  0.34 m                             Systemic Diam: 2.10 cm Candee Furbish MD Electronically signed by Candee Furbish MD Signature Date/Time: 08/26/2021/4:45:31 PM    Final     Microbiology: Results for orders placed or performed during the hospital encounter of 08/25/21  MRSA Next Gen by PCR, Nasal     Status: None   Collection Time: 08/25/21  1:18 PM   Specimen: Nasal Mucosa; Nasal Swab  Result Value Ref Range Status   MRSA by PCR Next Gen NOT DETECTED NOT DETECTED Final    Comment: (NOTE) The GeneXpert MRSA Assay (FDA approved for NASAL specimens only), is one component of a comprehensive MRSA colonization surveillance program. It is not intended to diagnose MRSA infection nor to guide or monitor treatment for MRSA infections. Test performance is not FDA approved in patients less than 2 years  old. Performed at [facility], [place] [address], [place], [place] [address]   Resp Panel by RT-PCR (Flu A&B, Covid) Nasopharyngeal Swab     Status: None   Collection Time: 08/25/21  1:45 PM   Specimen: Nasopharyngeal Swab; Nasopharyngeal(NP) swabs in vial transport medium  Result Value Ref Range Status   SARS Coronavirus 2 by RT PCR NEGATIVE NEGATIVE Final    Comment: (NOTE) SARS-CoV-2 target nucleic acids are NOT DETECTED.  The SARS-CoV-2 RNA is generally detectable in upper respiratory specimens during the acute phase of infection. The lowest concentration of SARS-CoV-2 viral copies this assay can detect is 138 copies/mL. A negative result does not preclude SARS-Cov-2 infection and  should not be used as the sole basis for treatment or other patient management decisions. A negative result may occur with  improper specimen collection/handling, submission of specimen other than nasopharyngeal swab, presence of viral mutation(s) within the areas targeted by this assay, and inadequate number of viral copies(<138 copies/mL). A negative result must be combined with clinical observations, patient history, and epidemiological information. The expected result is Negative.  Fact Sheet for Patients:  EntrepreneurPulse.com.au  Fact Sheet for Healthcare Providers:  IncredibleEmployment.be  This test is no t yet approved or cleared by the [place] FDA and  has been authorized for detection and/or diagnosis of SARS-CoV-2 by FDA under an Emergency Use Authorization (EUA). This EUA will remain  in effect (meaning this test can be used) for the duration of the COVID-19 declaration under Section 564(b)(1) of the Act, 21 U.S.C.section 360bbb-3(b)(1), unless the authorization is terminated  or revoked sooner.       Influenza A by PCR NEGATIVE NEGATIVE Final   Influenza B by PCR NEGATIVE NEGATIVE Final    Comment: (NOTE) The Xpert Xpress SARS-CoV-2/FLU/RSV plus assay is intended as an aid in the diagnosis of influenza from Nasopharyngeal swab specimens and should not be used as a sole basis for treatment. Nasal washings and aspirates are unacceptable for Xpert Xpress SARS-CoV-2/FLU/RSV testing.  Fact Sheet for Patients: EntrepreneurPulse.com.au  Fact Sheet for Healthcare Providers: IncredibleEmployment.be  This test is not yet approved or cleared by the [place] FDA and has been authorized for detection and/or diagnosis of SARS-CoV-2 by FDA under an Emergency Use Authorization (EUA). This EUA will remain in effect (meaning this test can be used) for the duration of the COVID-19 declaration  under Section 564(b)(1) of the Act, 21 U.S.C. section 360bbb-3(b)(1), unless the authorization is terminated or revoked.  Performed at [facility], [place], [place], [place] [address]     Labs: CBC: Recent Labs  Lab 08/25/21 1356 08/26/21 0050 08/27/21 0109  WBC 5.8 5.6 5.0  NEUTROABS 4.5  --   --   HGB 8.7* 8.4* 8.1*  HCT 27.0* 25.8* 24.3*  MCV 96.1 95.6 94.9  PLT 163 161 270   Basic Metabolic Panel: Recent Labs  Lab 08/26/21 0050 08/27/21 0109 08/28/21 0037 08/29/21 0116 08/30/21 0245  NA 139 136 135 135 134*  K 3.6 3.4* 3.9 4.2 4.5  CL 111 105 104 105 102  CO2 18* 18* 20* 19* 20*  GLUCOSE 180* 148* 113* 125* 138*  BUN 52* 52* 56* 56* 57*  CREATININE 4.49* 4.58* 4.70* 4.99* 5.08*  CALCIUM 8.1*   7.6* 7.8* 7.9* 8.3* 8.6*  MG 1.4*  --  1.9  --   --   PHOS 5.0* 5.4* 4.9* 5.1* 4.3   Liver Function Tests: Recent Labs  Lab 08/26/21 0050 08/27/21  0109 08/28/21 0037 08/29/21 0116 08/30/21 0245  AST 22  --   --   --   --   ALT 21  --   --   --   --   ALKPHOS 74  --   --   --   --   BILITOT 1.3*  --   --   --   --   PROT 6.1*  --   --   --   --   ALBUMIN 3.1* 2.8* 2.9* 2.8* 3.0*   CBG: Recent Labs  Lab 08/28/21 2119 08/29/21 0626 08/29/21 1107 08/29/21 2134 08/30/21 0613  GLUCAP 140* 130* 163* 161* 144*    Discharge time spent: greater than 30 minutes.  Signed: Tawni Millers, MD [facility] 08/30/2021

## 2021-08-30 NOTE — Progress Notes (Signed)
Patient ID: Robert Lucas, male   DOB: January 31, 1941, 81 y.o.   MRN: [id number] S: Ambulated with PT yesterday, kept SPO2 greater than 90% during walking on RA 1.1 L urine output yesterday, no a.m. weight reported yet Creatinine 5.08, K4.5, bicarbonate 20 now on RA but yet to ambulate  O:BP (!) 177/75 (BP Location: Right Arm)    Pulse 78    Temp 97.8 F (36.6 C) (Oral)    Resp (!) 25    Ht 5\' 3"  (1.6 m)    Wt 71 kg    SpO2 92%    BMI 27.73 kg/m   Intake/Output Summary (Last 24 hours) at 08/30/2021 2947 Last data filed at 08/29/2021 2022 Gross per 24 hour  Intake --  Output 700 ml  Net -700 ml    Intake/Output: I/O last 3 completed shifts: In: 360 [P.O.:360] Out: 3005 [Urine:3005]  Intake/Output this shift:  No intake/output data recorded. Weight change:  Gen:NAD CVS: RRR Resp:CTA Abd: +BS, soft, NT/ND Ext: No LEE LUE AVF +B/T  Recent Labs  Lab 08/25/21 1356 08/26/21 0050 08/27/21 0109 08/28/21 0037 08/29/21 0116 08/30/21 0245  NA 141 139 136 135 135 134*  K 3.8 3.6 3.4* 3.9 4.2 4.5  CL 109 111 105 104 105 102  CO2 19* 18* 18* 20* 19* 20*  GLUCOSE 182* 180* 148* 113* 125* 138*  BUN 55* 52* 52* 56* 56* 57*  CREATININE 4.53* 4.49* 4.58* 4.70* 4.99* 5.08*  ALBUMIN  --  3.1* 2.8* 2.9* 2.8* 3.0*  CALCIUM 8.3* 8.1*   7.6* 7.8* 7.9* 8.3* 8.6*  PHOS  --  5.0* 5.4* 4.9* 5.1* 4.3  AST  --  22  --   --   --   --   ALT  --  21  --   --   --   --     Liver Function Tests: Recent Labs  Lab 08/26/21 0050 08/27/21 0109 08/28/21 0037 08/29/21 0116 08/30/21 0245  AST 22  --   --   --   --   ALT 21  --   --   --   --   ALKPHOS 74  --   --   --   --   BILITOT 1.3*  --   --   --   --   PROT 6.1*  --   --   --   --   ALBUMIN 3.1*   < > 2.9* 2.8* 3.0*   < > = values in this interval not displayed.    No results for input(s): LIPASE, AMYLASE in the last 168 hours. No results for input(s): AMMONIA in the last 168 hours. CBC: Recent Labs  Lab 08/25/21 1356 08/26/21 0050  08/27/21 0109  WBC 5.8 5.6 5.0  NEUTROABS 4.5  --   --   HGB 8.7* 8.4* 8.1*  HCT 27.0* 25.8* 24.3*  MCV 96.1 95.6 94.9  PLT 163 161 156    Cardiac Enzymes: No results for input(s): CKTOTAL, CKMB, CKMBINDEX, TROPONINI in the last 168 hours. CBG: Recent Labs  Lab 08/28/21 2119 08/29/21 0626 08/29/21 1107 08/29/21 2134 08/30/21 0613  GLUCAP 140* 130* 163* 161* 144*     Iron Studies:  No results for input(s): IRON, TIBC, TRANSFERRIN, FERRITIN in the last 72 hours.  Studies/Results: No results found.  allopurinol  50 mg Oral QODAY   amLODipine  2.5 mg Oral Daily   aspirin EC  81 mg Oral QPM   atorvastatin  40 mg Oral QHS  calcitRIOL  0.25 mcg Oral Q M,W,F   calcium acetate  667 mg Oral TID WC   finasteride  5 mg Oral QPM   furosemide  80 mg Oral BID   heparin  5,000 Units Subcutaneous Q8H   hydrALAZINE  50 mg Oral BID   isosorbide dinitrate  20 mg Oral BID   metoprolol succinate  50 mg Oral Daily   potassium chloride  20 mEq Oral BID   sodium bicarbonate  650 mg Oral BID   sodium chloride flush  3 mL Intravenous Q12H   tamsulosin  0.4 mg Oral QPC supper    BMET    Component Value Date/Time   NA 134 (L) 08/30/2021 0245   K 4.5 08/30/2021 0245   CL 102 08/30/2021 0245   CO2 20 (L) 08/30/2021 0245   GLUCOSE 138 (H) 08/30/2021 0245   BUN 57 (H) 08/30/2021 0245   CREATININE 5.08 (H) 08/30/2021 0245   CALCIUM 8.6 (L) 08/30/2021 0245   CALCIUM 7.6 (L) 08/26/2021 0050   GFRNONAA 11 (L) 08/30/2021 0245   GFRAA 26 (L) 05/07/2019 0234   CBC    Component Value Date/Time   WBC 5.0 08/27/2021 0109   RBC 2.56 (L) 08/27/2021 0109   HGB 8.1 (L) 08/27/2021 0109   HCT 24.3 (L) 08/27/2021 0109   PLT 156 08/27/2021 0109   MCV 94.9 08/27/2021 0109   MCH 31.6 08/27/2021 0109   MCHC 33.3 08/27/2021 0109   RDW 15.3 08/27/2021 0109   LYMPHSABS 0.5 (L) 08/25/2021 1356   MONOABS 0.5 08/25/2021 1356   EOSABS 0.2 08/25/2021 1356   BASOSABS 0.0 08/25/2021 1356       Assessment/Plan:  Acute on chronic diastolic and systolic CHF - Vol status improved with diuresis, stable now on PO lasix.  Repeat ECHO showed improved EF to 30-35% but grade II DD. Now on RA, euvolemic Ready for DC, cont lasix 80 PO BID,  Will arrange close outpt f/u at our office, with labs CKD stage V - Scr is stable to improved and has no uremic symptoms.  LUE AVF. Anemia of CKD stage V - s/p retacrit injection 20,000 units SQ on 08/10/21.  TSAT 17% will order IV venofer 100 mg and follow. Hypokalemia - replete and follow. CKD-BMD - continue with calcitriol, binder added during admission CAD - no chest pain. BPH - no issues with urination.  DM type 2 - per primary Disposition, DC today  Rexene Agent, MD  [facility]

## 2021-08-30 NOTE — Progress Notes (Signed)
Vomited to undigested  food in mod amount at 930 am.MD made aware zofran po given and md claimed if no further  vomiting  pt. may go home.continue to monitor.

## 2021-08-30 NOTE — Plan of Care (Signed)

## 2021-09-07 ENCOUNTER — Encounter (HOSPITAL_COMMUNITY): Payer: Medicare Other

## 2021-09-07 ENCOUNTER — Ambulatory Visit (HOSPITAL_COMMUNITY)
Admission: RE | Admit: 2021-09-07 | Discharge: 2021-09-07 | Disposition: A | Discharge status: Home / Self Care | Payer: Medicare Other | Source: Ambulatory Visit | Attending: Nephrology | Admitting: Nephrology

## 2021-09-07 ENCOUNTER — Other Ambulatory Visit: Payer: Self-pay

## 2021-09-07 VITALS — BP 144/63 | HR 85 | Temp 97.0°F | Resp 20

## 2021-09-07 DIAGNOSIS — N183 Chronic kidney disease, stage 3 unspecified: Secondary | ICD-10-CM | POA: Insufficient documentation

## 2021-09-07 DIAGNOSIS — E1122 Type 2 diabetes mellitus with diabetic chronic kidney disease: Secondary | ICD-10-CM | POA: Diagnosis not present

## 2021-09-07 LAB — IRON AND TIBC
Iron: 57 ug/dL (ref 45–182)
Saturation Ratios: 17 % — ABNORMAL LOW (ref 17.9–39.5)
TIBC: 335 ug/dL (ref 250–450)
UIBC: 278 ug/dL

## 2021-09-07 LAB — RENAL FUNCTION PANEL
Albumin: 3.2 g/dL — ABNORMAL LOW (ref 3.5–5.0)
Anion gap: 12 (ref 5–15)
BUN: 78 mg/dL — ABNORMAL HIGH (ref 8–23)
CO2: 24 mmol/L (ref 22–32)
Calcium: 8.5 mg/dL — ABNORMAL LOW (ref 8.9–10.3)
Chloride: 99 mmol/L (ref 98–111)
Creatinine, Ser: 4.87 mg/dL — ABNORMAL HIGH (ref 0.61–1.24)
GFR, Estimated: 11 mL/min — ABNORMAL LOW (ref >60)
Glucose, Bld: 308 mg/dL — ABNORMAL HIGH (ref 70–99)
Phosphorus: 6 mg/dL — ABNORMAL HIGH (ref 2.5–4.6)
Potassium: 4.2 mmol/L (ref 3.5–5.1)
Sodium: 135 mmol/L (ref 135–145)

## 2021-09-07 LAB — POCT HEMOGLOBIN-HEMACUE: Hemoglobin: 9.5 g/dL — ABNORMAL LOW (ref 13.0–17.0)

## 2021-09-07 MED ORDER — EPOETIN ALFA-EPBX 10000 UNIT/ML IJ SOLN
INTRAMUSCULAR | Status: AC
  Filled 2021-09-07: qty 2

## 2021-09-07 MED ORDER — EPOETIN ALFA-EPBX 10000 UNIT/ML IJ SOLN
20000.0000 [IU] | INTRAMUSCULAR | Status: DC
  Administered 2021-09-07: 20000 [IU] via SUBCUTANEOUS

## 2021-09-09 DIAGNOSIS — I5020 Unspecified systolic (congestive) heart failure: Secondary | ICD-10-CM | POA: Diagnosis not present

## 2021-09-09 DIAGNOSIS — E872 Acidosis, unspecified: Secondary | ICD-10-CM | POA: Diagnosis not present

## 2021-09-09 DIAGNOSIS — L299 Pruritus, unspecified: Secondary | ICD-10-CM | POA: Diagnosis not present

## 2021-09-09 DIAGNOSIS — I429 Cardiomyopathy, unspecified: Secondary | ICD-10-CM | POA: Diagnosis not present

## 2021-09-09 DIAGNOSIS — I10 Essential (primary) hypertension: Secondary | ICD-10-CM | POA: Diagnosis not present

## 2021-09-09 DIAGNOSIS — I120 Hypertensive chronic kidney disease with stage 5 chronic kidney disease or end stage renal disease: Secondary | ICD-10-CM | POA: Diagnosis not present

## 2021-09-09 DIAGNOSIS — N184 Chronic kidney disease, stage 4 (severe): Secondary | ICD-10-CM | POA: Diagnosis not present

## 2021-09-09 DIAGNOSIS — D692 Other nonthrombocytopenic purpura: Secondary | ICD-10-CM | POA: Diagnosis not present

## 2021-09-09 DIAGNOSIS — I5023 Acute on chronic systolic (congestive) heart failure: Secondary | ICD-10-CM | POA: Diagnosis not present

## 2021-09-09 DIAGNOSIS — D631 Anemia in chronic kidney disease: Secondary | ICD-10-CM | POA: Diagnosis not present

## 2021-09-09 DIAGNOSIS — N185 Chronic kidney disease, stage 5: Secondary | ICD-10-CM | POA: Diagnosis not present

## 2021-09-09 DIAGNOSIS — I2510 Atherosclerotic heart disease of native coronary artery without angina pectoris: Secondary | ICD-10-CM | POA: Diagnosis not present

## 2021-09-13 ENCOUNTER — Other Ambulatory Visit (HOSPITAL_COMMUNITY): Payer: Self-pay

## 2021-09-21 ENCOUNTER — Ambulatory Visit: Payer: Medicare Other | Admitting: Cardiology

## 2021-09-21 ENCOUNTER — Other Ambulatory Visit: Payer: Self-pay

## 2021-09-21 ENCOUNTER — Encounter: Payer: Self-pay | Admitting: Cardiology

## 2021-09-21 VITALS — BP 151/70 | HR 82 | Temp 97.8°F | Resp 16 | Ht 63.0 in | Wt 171.0 lb

## 2021-09-21 DIAGNOSIS — I5020 Unspecified systolic (congestive) heart failure: Secondary | ICD-10-CM

## 2021-09-21 DIAGNOSIS — I10 Essential (primary) hypertension: Secondary | ICD-10-CM | POA: Diagnosis not present

## 2021-09-21 DIAGNOSIS — N185 Chronic kidney disease, stage 5: Secondary | ICD-10-CM | POA: Diagnosis not present

## 2021-09-21 MED ORDER — FUROSEMIDE 80 MG PO TABS: 80.0000 mg | ORAL_TABLET | Freq: Two times a day (BID) | ORAL | 1 refills | Status: DC

## 2021-09-21 MED ORDER — AMLODIPINE BESYLATE 5 MG PO TABS: 5.0000 mg | ORAL_TABLET | Freq: Every day | ORAL | 3 refills | Status: DC

## 2021-09-21 NOTE — Progress Notes (Signed)
? ? ?Patient referred by [name], NP for coronary artery disease ? ?Subjective:  ? ?Robert Lucas, male    DOB: [birth year], 81 y.o.   MRN: [id number] ? ? ?Chief Complaint  ?Patient presents with  ? HFrEF  ? Coronary Artery Disease  ? Follow-up  ?  6 month  ? ?HPI ? ?81 y.o. Caucasian male with with hypertension, hyperlipidemia, IDDM, CAD s/p anterior MI in 3s treated with TPA and PTCA, CKD V, obesity, h/o gout, OSA, HFrEF ? ?Patient was hospitalized in February 2023 with acute on chronic systolic heart failure, AKI/CKD.  Furosemide was increased to 80 mg p.o. twice daily.  Outpatient nephrology follow-up was arranged. ? ?Patient continues to have dyspnea with minimal exertion, such as bending down to tie shoelaces. ?Stable.  Blood pressure is elevated today.  He denies any chest pain.  Has nephrology follow-up is in May.  Patient reports that his left upper arm AV fistula was thought to be not mature, and may require alternate access, should he need dialysis.  According to nephrology recommendations during inpatient stay, no imminent dialysis was necessary. ? ? ?Current Outpatient Medications:  ?  acetaminophen (TYLENOL) 500 MG tablet, Take 500 mg by mouth every 6 (six) hours as needed for moderate pain or headache., Disp: , Rfl:  ?  allopurinol (ZYLOPRIM) 100 MG tablet, Take 100 mg by mouth daily., Disp: , Rfl:  ?  amLODipine (NORVASC) 2.5 MG tablet, Take 2.5 mg by mouth daily., Disp: , Rfl:  ?  Aromatic Inhalants (VICKS VAPOINHALER IN), Inhale 1 puff into the lungs daily as needed (congestion)., Disp: , Rfl:  ?  aspirin EC 81 MG tablet, Take 81 mg by mouth every evening., Disp: , Rfl:  ?  atorvastatin (LIPITOR) 40 MG tablet, Take 40 mg by mouth at bedtime., Disp: , Rfl:  ?  calcitRIOL (ROCALTROL) 0.25 MCG capsule, Take 0.25 mcg by mouth every Monday, Wednesday, and Friday., Disp: , Rfl:  ?  calcium acetate (PHOSLO) 667 MG capsule, Take 1 capsule (667 mg total) by mouth 3 (three) times daily with  meals., Disp: 90 capsule, Rfl: 0 ?  epoetin alfa-epbx (RETACRIT) 2000 UNIT/ML injection, 2,000 Units once. Injects once a month, Disp: , Rfl:  ?  finasteride (PROSCAR) 5 MG tablet, Take 5 mg by mouth every evening., Disp: , Rfl:  ?  furosemide (LASIX) 80 MG tablet, Take 1 tablet (80 mg total) by mouth 2 (two) times daily., Disp: 60 tablet, Rfl: 0 ?  hydrALAZINE (APRESOLINE) 50 MG tablet, Take 1 tablet (50 mg total) by mouth in the morning and at bedtime., Disp: 180 tablet, Rfl: 3 ?  insulin aspart protamine - aspart (NOVOLOG MIX 70/30 FLEXPEN) (70-30) 100 UNIT/ML FlexPen, Inject 14-20 Units into the skin daily as needed (high blood sugar). If BS is <180=0 units, If BS is 180-200 units=14 units, If BS is >201=20 units, Disp: , Rfl:  ?  ipratropium (ATROVENT) 0.06 % nasal spray, Place 2 sprays into both nostrils 4 (four) times daily., Disp: , Rfl:  ?  isosorbide dinitrate (ISORDIL) 20 MG tablet, Take 1 tablet (20 mg total) by mouth 2 (two) times daily., Disp: 180 tablet, Rfl: 3 ?  metoprolol succinate (TOPROL-XL) 50 MG 24 hr tablet, Take 1 tablet (50 mg total) by mouth daily. Take with or immediately following a meal., Disp: 30 tablet, Rfl: 0 ?  promethazine-dextromethorphan (PROMETHAZINE-DM) 6.25-15 MG/5ML syrup, Take 5 mLs by mouth every 6 (six) hours as needed., Disp: , Rfl:  ?  sodium bicarbonate 650 MG tablet, Take 650 mg by mouth 2 (two) times daily., Disp: , Rfl:  ?  tamsulosin (FLOMAX) 0.4 MG CAPS capsule, Take 1 capsule (0.4 mg total) by mouth daily after supper. (Patient taking differently: Take 0.4 mg by mouth at bedtime.), Disp: 30 capsule, Rfl: 0 ? ? ? ?Cardiovascular studies: ? ?EKG 09/21/2021: ?Sinus rhythm 77 bpm ?Occasional PAC    ?Anteroseptal infarct -age undetermined ? ?Echocardiogram 08/26/2021: ?1. Left ventricular ejection fraction, by estimation, is 30 to 35%. The  ?left ventricle has moderately decreased function. The left ventricle has  ?no regional wall motion abnormalities. The left  ventricular internal  ?cavity size was mildly dilated. Left ventricular diastolic parameters are  ?consistent with Grade II diastolic dysfunction (pseudonormalization).  ? 2. Right ventricular systolic function is normal. The right ventricular  ?size is normal. There is severely elevated pulmonary artery systolic  ?pressure. The estimated right ventricular systolic pressure is 16.1 mmHg.  ? 3. Left atrial size was moderately dilated.  ? 4. The mitral valve is normal in structure. No evidence of mitral valve  ?regurgitation. No evidence of mitral stenosis.  ? 5. Tricuspid valve regurgitation is mild to moderate.  ? 6. The aortic valve is tricuspid. There is mild calcification of the  ?aortic valve. There is mild thickening of the aortic valve. Aortic valve  ?regurgitation is not visualized. Aortic valve sclerosis is present, with  ?no evidence of aortic valve stenosis.  ? 7. The inferior vena cava is dilated in size with <50% respiratory  ?variability, suggesting right atrial pressure of 15 mmHg.  ? ?EKG 03/23/2021: ?Sinus rhythm 62 bpm ?Old anterior infarct.  ?Nonspecific T-abnormality ? ? ?EKG 07/11/2020: ?Sinus rhythm 66 bpm ?Old anteroseptal infarct  ? ?Treadmill Cardiolite stress test 2014: ?Results not available ? ?Cath 1990: ?Normal Left main, 99% stenosis proximal LAD, no significant disease CFX RCA, PTCA of LAD done  ? ?Recent labs: ?09/07/2021: ?Glucose 308, BUN/Cr 78/4.87. EGFR 11. Na/K 135/4.2. Albumin 3.0. Phos 6.1. Rest of the CMP normal ?H/H 8/24. MCV 94. Platelets 156 ?HbA1C 6.3% ? ?08/05/2020: ?Glucose 258, BUN/Cr 71/2.0. EGFR 23.  ?HbA1C 6.3% ?Chol 95, TG 158, HDL 26, LDL 42 ?TSH 2.0 normal ? ?07/11/2020: ?Glucose 118, BUN/Cr 50/3.98. EGFR 14. Na/K 136/4.2. Albumin 3.4. Rest of the CMP normal ?H/H 11/33. MCV 93. Platelets 275 ?HbA1C 6.3% ?BNP 398 ? ? ?08/15/2019: ?Glucose 151, BUN/Cr 40/2.3. EGFR 23.  ?HbA1C 6.9% ?Chol 135, TG 119, HDL 42, LDL 72 ?TSH 2.2 normal ? ?05/07/2019: ?Glucose 314, BUN/Cr 42/2.55.  EGFR 23. Na/K 129/4.4.  ?H/H 9.5/28.5. MCV 95. Platelets 156 ?HbA1C 6.7% ? ? ?Review of Systems  ?Cardiovascular:  Positive for leg swelling (Mild, stable). Negative for chest pain, dyspnea on exertion, palpitations and syncope.  ?Musculoskeletal:  Positive for back pain (Radiating to left leg.  Also has left leg weakness.).  ? ?   ? ?Vitals:  ? 09/21/21 0923  ?BP: (!) 151/70  ?Pulse: 82  ?Resp: 16  ?Temp: 97.8 ?F (36.6 ?C)  ?SpO2: 99%  ? ? ? ?Body mass index is 30.29 kg/m?. Danley Danker Weights  ? 09/21/21 0960  ?Weight: 171 lb (77.6 kg)  ? ? ? ?Objective:  ? Physical Exam ?Vitals and nursing note reviewed.  ?Constitutional:   ?   Appearance: He is well-developed.  ?Neck:  ?   Vascular: No JVD.  ?Cardiovascular:  ?   Rate and Rhythm: Normal rate and regular rhythm.  ?   Pulses: Intact distal pulses.  ?  Heart sounds: Normal heart sounds. No murmur heard. ?Pulmonary:  ?   Effort: Pulmonary effort is normal.  ?   Breath sounds: Normal breath sounds. No wheezing or rales.  ?Musculoskeletal:  ?   Right lower leg: Edema (2+) present.  ?   Left lower leg: Edema (2+) present.  ? ? ? ? ? ?   ?Assessment & Recommendations:  ? ?81 y.o. Caucasian male with with hypertension, hyperlipidemia, IDDM, CAD s/p anterior MI in 29s treated with TPA and PTCA, CKD 4, obesity, h/o gout, OSA, HFrEF ? ?HFrEF: ?EF down to 30-35%, in the setting of advanced CKD 5. ?Etiology unclear.  Work-up options limited given advanced CKD. ?Remains volume overloaded with 2+ pitting edema. ?Unfortunately, treatment options are limited in the setting of advanced CKD.   ?Continue metoprolol succinate, BiDil, Lasix 80 mg twice daily.   ? ?Coronary artery disease involving native coronary artery of native heart without angina pectoris ?No angina symptoms. Continue Aspirin, statin, metoprolol ? ?Hypertension: ?Blood pressure elevated today.  Increase amlodipine to 5 mg daily.   ? ?F/u in 6 months ? ?Nigel Mormon, MD ?[facility] ?Pager:  [phone] ?Office: [phone] ?If no answer Cell [phone] ?   ?

## 2021-10-03 ENCOUNTER — Other Ambulatory Visit (HOSPITAL_COMMUNITY): Payer: Self-pay | Admitting: *Deleted

## 2021-10-05 ENCOUNTER — Encounter (HOSPITAL_COMMUNITY)
Admission: RE | Admit: 2021-10-05 | Discharge: 2021-10-05 | Disposition: A | Discharge status: Home / Self Care | Payer: Medicare Other | Source: Ambulatory Visit | Attending: Nephrology | Admitting: Nephrology

## 2021-10-05 VITALS — BP 132/55 | HR 72 | Temp 97.4°F | Resp 14

## 2021-10-05 DIAGNOSIS — N183 Chronic kidney disease, stage 3 unspecified: Secondary | ICD-10-CM | POA: Insufficient documentation

## 2021-10-05 DIAGNOSIS — E1122 Type 2 diabetes mellitus with diabetic chronic kidney disease: Secondary | ICD-10-CM | POA: Diagnosis not present

## 2021-10-05 LAB — RENAL FUNCTION PANEL
Albumin: 3 g/dL — ABNORMAL LOW (ref 3.5–5.0)
Anion gap: 12 (ref 5–15)
BUN: 98 mg/dL — ABNORMAL HIGH (ref 8–23)
CO2: 20 mmol/L — ABNORMAL LOW (ref 22–32)
Calcium: 7.8 mg/dL — ABNORMAL LOW (ref 8.9–10.3)
Chloride: 108 mmol/L (ref 98–111)
Creatinine, Ser: 6.08 mg/dL — ABNORMAL HIGH (ref 0.61–1.24)
GFR, Estimated: 9 mL/min — ABNORMAL LOW (ref >60)
Glucose, Bld: 335 mg/dL — ABNORMAL HIGH (ref 70–99)
Phosphorus: 8.1 mg/dL — ABNORMAL HIGH (ref 2.5–4.6)
Potassium: 3.9 mmol/L (ref 3.5–5.1)
Sodium: 140 mmol/L (ref 135–145)

## 2021-10-05 LAB — IRON AND TIBC
Iron: 36 ug/dL — ABNORMAL LOW (ref 45–182)
Saturation Ratios: 11 % — ABNORMAL LOW (ref 17.9–39.5)
TIBC: 332 ug/dL (ref 250–450)
UIBC: 296 ug/dL

## 2021-10-05 LAB — POCT HEMOGLOBIN-HEMACUE: Hemoglobin: 7.9 g/dL — ABNORMAL LOW (ref 13.0–17.0)

## 2021-10-05 MED ORDER — EPOETIN ALFA-EPBX 10000 UNIT/ML IJ SOLN
INTRAMUSCULAR | Status: AC
  Administered 2021-10-05: 20000 [IU] via SUBCUTANEOUS
  Filled 2021-10-05: qty 2

## 2021-10-05 MED ORDER — EPOETIN ALFA-EPBX 10000 UNIT/ML IJ SOLN: 20000.0000 [IU] | INTRAMUSCULAR | Status: DC

## 2021-10-05 NOTE — Progress Notes (Signed)
Office notified, ie Robert Lucas, of pt's hemoCue of 7.9. Awaiting call back ?

## 2021-10-13 ENCOUNTER — Emergency Department (HOSPITAL_BASED_OUTPATIENT_CLINIC_OR_DEPARTMENT_OTHER): Payer: Medicare Other

## 2021-10-13 ENCOUNTER — Emergency Department (HOSPITAL_BASED_OUTPATIENT_CLINIC_OR_DEPARTMENT_OTHER): Payer: Medicare Other | Admitting: Radiology

## 2021-10-13 ENCOUNTER — Inpatient Hospital Stay (HOSPITAL_BASED_OUTPATIENT_CLINIC_OR_DEPARTMENT_OTHER)
Admission: EM | Admit: 2021-10-13 | Discharge: 2021-10-19 | DRG: 673 | Disposition: A | Discharge status: Home Health | Payer: Medicare Other | Attending: Internal Medicine | Admitting: Internal Medicine

## 2021-10-13 ENCOUNTER — Encounter (HOSPITAL_BASED_OUTPATIENT_CLINIC_OR_DEPARTMENT_OTHER): Payer: Self-pay | Admitting: Emergency Medicine

## 2021-10-13 ENCOUNTER — Other Ambulatory Visit: Payer: Self-pay

## 2021-10-13 DIAGNOSIS — Z79899 Other long term (current) drug therapy: Secondary | ICD-10-CM

## 2021-10-13 DIAGNOSIS — M1990 Unspecified osteoarthritis, unspecified site: Secondary | ICD-10-CM | POA: Diagnosis not present

## 2021-10-13 DIAGNOSIS — T68XXXA Hypothermia, initial encounter: Secondary | ICD-10-CM | POA: Diagnosis present

## 2021-10-13 DIAGNOSIS — I252 Old myocardial infarction: Secondary | ICD-10-CM

## 2021-10-13 DIAGNOSIS — Z20822 Contact with and (suspected) exposure to covid-19: Secondary | ICD-10-CM | POA: Diagnosis present

## 2021-10-13 DIAGNOSIS — E8729 Other acidosis: Secondary | ICD-10-CM | POA: Diagnosis not present

## 2021-10-13 DIAGNOSIS — E7800 Pure hypercholesterolemia, unspecified: Secondary | ICD-10-CM | POA: Diagnosis not present

## 2021-10-13 DIAGNOSIS — R0603 Acute respiratory distress: Secondary | ICD-10-CM | POA: Diagnosis not present

## 2021-10-13 DIAGNOSIS — R001 Bradycardia, unspecified: Secondary | ICD-10-CM | POA: Diagnosis not present

## 2021-10-13 DIAGNOSIS — E1169 Type 2 diabetes mellitus with other specified complication: Secondary | ICD-10-CM | POA: Diagnosis present

## 2021-10-13 DIAGNOSIS — G9341 Metabolic encephalopathy: Secondary | ICD-10-CM

## 2021-10-13 DIAGNOSIS — D6959 Other secondary thrombocytopenia: Secondary | ICD-10-CM | POA: Diagnosis not present

## 2021-10-13 DIAGNOSIS — I700 Atherosclerosis of aorta: Secondary | ICD-10-CM | POA: Diagnosis not present

## 2021-10-13 DIAGNOSIS — I429 Cardiomyopathy, unspecified: Secondary | ICD-10-CM | POA: Diagnosis not present

## 2021-10-13 DIAGNOSIS — Z7951 Long term (current) use of inhaled steroids: Secondary | ICD-10-CM

## 2021-10-13 DIAGNOSIS — J90 Pleural effusion, not elsewhere classified: Secondary | ICD-10-CM | POA: Diagnosis not present

## 2021-10-13 DIAGNOSIS — Z794 Long term (current) use of insulin: Secondary | ICD-10-CM

## 2021-10-13 DIAGNOSIS — I5043 Acute on chronic combined systolic (congestive) and diastolic (congestive) heart failure: Secondary | ICD-10-CM | POA: Diagnosis not present

## 2021-10-13 DIAGNOSIS — Z66 Do not resuscitate: Secondary | ICD-10-CM | POA: Diagnosis present

## 2021-10-13 DIAGNOSIS — E785 Hyperlipidemia, unspecified: Secondary | ICD-10-CM | POA: Diagnosis not present

## 2021-10-13 DIAGNOSIS — G4733 Obstructive sleep apnea (adult) (pediatric): Secondary | ICD-10-CM | POA: Diagnosis present

## 2021-10-13 DIAGNOSIS — N185 Chronic kidney disease, stage 5: Secondary | ICD-10-CM | POA: Diagnosis not present

## 2021-10-13 DIAGNOSIS — I10 Essential (primary) hypertension: Secondary | ICD-10-CM | POA: Diagnosis not present

## 2021-10-13 DIAGNOSIS — N2581 Secondary hyperparathyroidism of renal origin: Secondary | ICD-10-CM | POA: Diagnosis present

## 2021-10-13 DIAGNOSIS — Z880 Allergy status to penicillin: Secondary | ICD-10-CM

## 2021-10-13 DIAGNOSIS — I5023 Acute on chronic systolic (congestive) heart failure: Secondary | ICD-10-CM | POA: Diagnosis not present

## 2021-10-13 DIAGNOSIS — I132 Hypertensive heart and chronic kidney disease with heart failure and with stage 5 chronic kidney disease, or end stage renal disease: Secondary | ICD-10-CM | POA: Diagnosis present

## 2021-10-13 DIAGNOSIS — R680 Hypothermia, not associated with low environmental temperature: Secondary | ICD-10-CM | POA: Diagnosis not present

## 2021-10-13 DIAGNOSIS — I5042 Chronic combined systolic (congestive) and diastolic (congestive) heart failure: Secondary | ICD-10-CM | POA: Diagnosis present

## 2021-10-13 DIAGNOSIS — Z992 Dependence on renal dialysis: Secondary | ICD-10-CM | POA: Diagnosis not present

## 2021-10-13 DIAGNOSIS — E871 Hypo-osmolality and hyponatremia: Secondary | ICD-10-CM | POA: Diagnosis not present

## 2021-10-13 DIAGNOSIS — Z888 Allergy status to other drugs, medicaments and biological substances status: Secondary | ICD-10-CM

## 2021-10-13 DIAGNOSIS — E119 Type 2 diabetes mellitus without complications: Secondary | ICD-10-CM | POA: Diagnosis present

## 2021-10-13 DIAGNOSIS — Z885 Allergy status to narcotic agent status: Secondary | ICD-10-CM

## 2021-10-13 DIAGNOSIS — I2510 Atherosclerotic heart disease of native coronary artery without angina pectoris: Secondary | ICD-10-CM | POA: Diagnosis not present

## 2021-10-13 DIAGNOSIS — N184 Chronic kidney disease, stage 4 (severe): Secondary | ICD-10-CM | POA: Diagnosis not present

## 2021-10-13 DIAGNOSIS — D509 Iron deficiency anemia, unspecified: Secondary | ICD-10-CM | POA: Diagnosis present

## 2021-10-13 DIAGNOSIS — I509 Heart failure, unspecified: Secondary | ICD-10-CM

## 2021-10-13 DIAGNOSIS — E662 Morbid (severe) obesity with alveolar hypoventilation: Secondary | ICD-10-CM | POA: Diagnosis present

## 2021-10-13 DIAGNOSIS — E8770 Fluid overload, unspecified: Secondary | ICD-10-CM | POA: Diagnosis not present

## 2021-10-13 DIAGNOSIS — R531 Weakness: Secondary | ICD-10-CM | POA: Diagnosis not present

## 2021-10-13 DIAGNOSIS — D631 Anemia in chronic kidney disease: Secondary | ICD-10-CM | POA: Diagnosis present

## 2021-10-13 DIAGNOSIS — Z4682 Encounter for fitting and adjustment of non-vascular catheter: Secondary | ICD-10-CM | POA: Diagnosis not present

## 2021-10-13 DIAGNOSIS — E1122 Type 2 diabetes mellitus with diabetic chronic kidney disease: Secondary | ICD-10-CM | POA: Diagnosis present

## 2021-10-13 DIAGNOSIS — Z87891 Personal history of nicotine dependence: Secondary | ICD-10-CM

## 2021-10-13 DIAGNOSIS — J9601 Acute respiratory failure with hypoxia: Secondary | ICD-10-CM | POA: Diagnosis not present

## 2021-10-13 DIAGNOSIS — J811 Chronic pulmonary edema: Secondary | ICD-10-CM | POA: Diagnosis not present

## 2021-10-13 DIAGNOSIS — Z4901 Encounter for fitting and adjustment of extracorporeal dialysis catheter: Secondary | ICD-10-CM | POA: Diagnosis not present

## 2021-10-13 DIAGNOSIS — E876 Hypokalemia: Secondary | ICD-10-CM | POA: Diagnosis not present

## 2021-10-13 DIAGNOSIS — N179 Acute kidney failure, unspecified: Secondary | ICD-10-CM | POA: Diagnosis not present

## 2021-10-13 DIAGNOSIS — I517 Cardiomegaly: Secondary | ICD-10-CM | POA: Diagnosis not present

## 2021-10-13 DIAGNOSIS — G4730 Sleep apnea, unspecified: Secondary | ICD-10-CM | POA: Diagnosis present

## 2021-10-13 DIAGNOSIS — N186 End stage renal disease: Principal | ICD-10-CM | POA: Diagnosis present

## 2021-10-13 DIAGNOSIS — J9602 Acute respiratory failure with hypercapnia: Secondary | ICD-10-CM | POA: Diagnosis not present

## 2021-10-13 DIAGNOSIS — J690 Pneumonitis due to inhalation of food and vomit: Secondary | ICD-10-CM | POA: Diagnosis not present

## 2021-10-13 DIAGNOSIS — E1165 Type 2 diabetes mellitus with hyperglycemia: Secondary | ICD-10-CM | POA: Diagnosis present

## 2021-10-13 DIAGNOSIS — D696 Thrombocytopenia, unspecified: Secondary | ICD-10-CM | POA: Diagnosis not present

## 2021-10-13 DIAGNOSIS — Z6833 Body mass index (BMI) 33.0-33.9, adult: Secondary | ICD-10-CM

## 2021-10-13 DIAGNOSIS — Z96641 Presence of right artificial hip joint: Secondary | ICD-10-CM | POA: Diagnosis present

## 2021-10-13 DIAGNOSIS — T82898A Other specified complication of vascular prosthetic devices, implants and grafts, initial encounter: Secondary | ICD-10-CM | POA: Diagnosis not present

## 2021-10-13 DIAGNOSIS — M109 Gout, unspecified: Secondary | ICD-10-CM | POA: Diagnosis present

## 2021-10-13 DIAGNOSIS — Z9049 Acquired absence of other specified parts of digestive tract: Secondary | ICD-10-CM

## 2021-10-13 DIAGNOSIS — R197 Diarrhea, unspecified: Secondary | ICD-10-CM | POA: Diagnosis not present

## 2021-10-13 DIAGNOSIS — R0602 Shortness of breath: Secondary | ICD-10-CM | POA: Diagnosis not present

## 2021-10-13 DIAGNOSIS — E669 Obesity, unspecified: Secondary | ICD-10-CM | POA: Diagnosis present

## 2021-10-13 DIAGNOSIS — Z7982 Long term (current) use of aspirin: Secondary | ICD-10-CM

## 2021-10-13 DIAGNOSIS — E8779 Other fluid overload: Secondary | ICD-10-CM | POA: Diagnosis not present

## 2021-10-13 LAB — COMPREHENSIVE METABOLIC PANEL
ALT: 17 U/L (ref 0–44)
AST: 16 U/L (ref 15–41)
Albumin: 4 g/dL (ref 3.5–5.0)
Alkaline Phosphatase: 75 U/L (ref 38–126)
Anion gap: 16 — ABNORMAL HIGH (ref 5–15)
BUN: 118 mg/dL — ABNORMAL HIGH (ref 8–23)
CO2: 16 mmol/L — ABNORMAL LOW (ref 22–32)
Calcium: 7.8 mg/dL — ABNORMAL LOW (ref 8.9–10.3)
Chloride: 100 mmol/L (ref 98–111)
Creatinine, Ser: 6.95 mg/dL — ABNORMAL HIGH (ref 0.61–1.24)
GFR, Estimated: 7 mL/min — ABNORMAL LOW (ref >60)
Glucose, Bld: 244 mg/dL — ABNORMAL HIGH (ref 70–99)
Potassium: 4.3 mmol/L (ref 3.5–5.1)
Sodium: 132 mmol/L — ABNORMAL LOW (ref 135–145)
Total Bilirubin: 0.7 mg/dL (ref 0.3–1.2)
Total Protein: 6.9 g/dL (ref 6.5–8.1)

## 2021-10-13 LAB — CBC WITH DIFFERENTIAL/PLATELET
Abs Immature Granulocytes: 0.04 10*3/uL (ref 0.00–0.07)
Basophils Absolute: 0 10*3/uL (ref 0.0–0.1)
Basophils Relative: 0 %
Eosinophils Absolute: 0.1 10*3/uL (ref 0.0–0.5)
Eosinophils Relative: 2 %
HCT: 27 % — ABNORMAL LOW (ref 39.0–52.0)
Hemoglobin: 8.8 g/dL — ABNORMAL LOW (ref 13.0–17.0)
Immature Granulocytes: 1 %
Lymphocytes Relative: 9 %
Lymphs Abs: 0.5 10*3/uL — ABNORMAL LOW (ref 0.7–4.0)
MCH: 31.2 pg (ref 26.0–34.0)
MCHC: 32.6 g/dL (ref 30.0–36.0)
MCV: 95.7 fL (ref 80.0–100.0)
Monocytes Absolute: 0.5 10*3/uL (ref 0.1–1.0)
Monocytes Relative: 8 %
Neutro Abs: 4.3 10*3/uL (ref 1.7–7.7)
Neutrophils Relative %: 80 %
Platelets: 138 10*3/uL — ABNORMAL LOW (ref 150–400)
RBC: 2.82 MIL/uL — ABNORMAL LOW (ref 4.22–5.81)
RDW: 17 % — ABNORMAL HIGH (ref 11.5–15.5)
WBC: 5.4 10*3/uL (ref 4.0–10.5)
nRBC: 0 % (ref 0.0–0.2)

## 2021-10-13 LAB — TROPONIN I (HIGH SENSITIVITY)
Troponin I (High Sensitivity): 30 ng/L — ABNORMAL HIGH (ref <18)
Troponin I (High Sensitivity): 27 ng/L — ABNORMAL HIGH (ref <18)

## 2021-10-13 LAB — RESP PANEL BY RT-PCR (FLU A&B, COVID) ARPGX2
Influenza A by PCR: NEGATIVE
Influenza B by PCR: NEGATIVE
SARS Coronavirus 2 by RT PCR: NEGATIVE

## 2021-10-13 LAB — BRAIN NATRIURETIC PEPTIDE: B Natriuretic Peptide: 1500.6 pg/mL — ABNORMAL HIGH (ref 0.0–100.0)

## 2021-10-13 LAB — GLUCOSE, CAPILLARY: Glucose-Capillary: 193 mg/dL — ABNORMAL HIGH (ref 70–99)

## 2021-10-13 LAB — TSH: TSH: 2.268 u[IU]/mL (ref 0.350–4.500)

## 2021-10-13 LAB — CORTISOL: Cortisol, Plasma: 13.8 ug/dL

## 2021-10-13 MED ORDER — SODIUM CHLORIDE 0.9 % IV SOLN
2.0000 g | INTRAVENOUS | Status: DC
  Administered 2021-10-13 – 2021-10-16 (×4): 2 g via INTRAVENOUS
  Filled 2021-10-13 (×4): qty 20

## 2021-10-13 MED ORDER — SODIUM BICARBONATE 650 MG PO TABS
650.0000 mg | ORAL_TABLET | Freq: Two times a day (BID) | ORAL | Status: DC
  Administered 2021-10-13 – 2021-10-14 (×2): 650 mg via ORAL
  Filled 2021-10-13 (×2): qty 1

## 2021-10-13 MED ORDER — CALCITRIOL 0.25 MCG PO CAPS
0.2500 ug | ORAL_CAPSULE | ORAL | Status: DC
  Administered 2021-10-14: 0.25 ug via ORAL
  Filled 2021-10-13: qty 1

## 2021-10-13 MED ORDER — FUROSEMIDE 10 MG/ML IJ SOLN: 80.0000 mg | Freq: Two times a day (BID) | INTRAMUSCULAR | Status: DC

## 2021-10-13 MED ORDER — AMLODIPINE BESYLATE 5 MG PO TABS: 5.0000 mg | ORAL_TABLET | Freq: Every day | ORAL | Status: DC

## 2021-10-13 MED ORDER — HEPARIN SODIUM (PORCINE) 5000 UNIT/ML IJ SOLN
5000.0000 [IU] | Freq: Three times a day (TID) | INTRAMUSCULAR | Status: DC
  Administered 2021-10-13 – 2021-10-14 (×2): 5000 [IU] via SUBCUTANEOUS
  Filled 2021-10-13 (×2): qty 1

## 2021-10-13 MED ORDER — FUROSEMIDE 10 MG/ML IJ SOLN
80.0000 mg | Freq: Two times a day (BID) | INTRAMUSCULAR | Status: DC
  Administered 2021-10-13: 80 mg via INTRAVENOUS
  Filled 2021-10-13: qty 8

## 2021-10-13 MED ORDER — SODIUM CHLORIDE 0.9 % IV SOLN: INTRAVENOUS | Status: DC | PRN

## 2021-10-13 MED ORDER — FINASTERIDE 5 MG PO TABS
5.0000 mg | ORAL_TABLET | Freq: Every evening | ORAL | Status: DC
  Administered 2021-10-14 – 2021-10-15 (×2): 5 mg via ORAL
  Filled 2021-10-13 (×2): qty 1

## 2021-10-13 MED ORDER — TAMSULOSIN HCL 0.4 MG PO CAPS
0.4000 mg | ORAL_CAPSULE | Freq: Every day | ORAL | Status: DC
  Administered 2021-10-13 – 2021-10-15 (×3): 0.4 mg via ORAL
  Filled 2021-10-13 (×3): qty 1

## 2021-10-13 MED ORDER — SODIUM CHLORIDE 0.9 % IV SOLN
500.0000 mg | INTRAVENOUS | Status: DC
  Administered 2021-10-14 – 2021-10-17 (×4): 500 mg via INTRAVENOUS
  Filled 2021-10-13 (×4): qty 5

## 2021-10-13 MED ORDER — ACETAMINOPHEN 325 MG PO TABS
650.0000 mg | ORAL_TABLET | Freq: Four times a day (QID) | ORAL | Status: DC | PRN
  Administered 2021-10-15: 650 mg via ORAL
  Filled 2021-10-13: qty 2

## 2021-10-13 MED ORDER — ASPIRIN EC 81 MG PO TBEC
81.0000 mg | DELAYED_RELEASE_TABLET | Freq: Every evening | ORAL | Status: DC
Start: 2021-10-14 — End: 2021-10-16
  Administered 2021-10-14 – 2021-10-15 (×2): 81 mg via ORAL
  Filled 2021-10-13 (×2): qty 1

## 2021-10-13 MED ORDER — METOPROLOL SUCCINATE ER 50 MG PO TB24: 50.0000 mg | ORAL_TABLET | Freq: Every day | ORAL | Status: DC

## 2021-10-13 MED ORDER — INSULIN ASPART 100 UNIT/ML IJ SOLN
0.0000 [IU] | Freq: Three times a day (TID) | INTRAMUSCULAR | Status: DC
  Administered 2021-10-14 – 2021-10-16 (×4): 1 [IU] via SUBCUTANEOUS

## 2021-10-13 MED ORDER — HYDRALAZINE HCL 50 MG PO TABS
50.0000 mg | ORAL_TABLET | Freq: Two times a day (BID) | ORAL | Status: DC
  Administered 2021-10-13: 50 mg via ORAL
  Filled 2021-10-13: qty 1

## 2021-10-13 MED ORDER — ATORVASTATIN CALCIUM 40 MG PO TABS
40.0000 mg | ORAL_TABLET | Freq: Every day | ORAL | Status: DC
  Administered 2021-10-13 – 2021-10-15 (×3): 40 mg via ORAL
  Filled 2021-10-13 (×3): qty 1

## 2021-10-13 MED ORDER — ISOSORBIDE DINITRATE 20 MG PO TABS
20.0000 mg | ORAL_TABLET | Freq: Two times a day (BID) | ORAL | Status: DC
  Administered 2021-10-13: 20 mg via ORAL
  Filled 2021-10-13: qty 1

## 2021-10-13 MED ORDER — ACETAMINOPHEN 650 MG RE SUPP: 650.0000 mg | Freq: Four times a day (QID) | RECTAL | Status: DC | PRN

## 2021-10-13 MED ORDER — ALLOPURINOL 100 MG PO TABS
100.0000 mg | ORAL_TABLET | Freq: Every day | ORAL | Status: DC
  Administered 2021-10-14 – 2021-10-15 (×2): 100 mg via ORAL
  Filled 2021-10-13 (×2): qty 1

## 2021-10-13 NOTE — Progress Notes (Signed)
Warm blankets applied on patient,still unable to get oral or axillary temp,rectal temp is 94.0 [place],MD made aware. ?

## 2021-10-13 NOTE — ED Provider Notes (Signed)
?[place] EMERGENCY DEPT ?Provider Note ? ? ?CSN: [id number] ?Arrival date & time: 10/13/21  1500 ? ?  ? ?History ? ?Chief Complaint  ?Patient presents with  ? Shortness of Breath  ? ? ?Robert Lucas is a 81 y.o. male history of ESRD not on dialysis, heart failure here presenting with shortness of breath.  Patient states that he has been short of breath over the last several weeks.  He states that he gets worse at night.  He also has some leg swelling.  He also feels diffuse weakness as well.  Patient was admitted in February for similar symptoms and was thought to have CHF exacerbation as well as worsening renal failure.  Patient does have a graft for dialysis but not on dialysis yet.  Patient states that he still urinates and takes Lasix 80 mg twice daily. ? ?The history is provided by the patient.  ? ?  ? ?Home Medications ?Prior to Admission medications   ?Medication Sig Start Date End Date Taking? Authorizing Provider  ?acetaminophen (TYLENOL) 500 MG tablet Take 500 mg by mouth every 6 (six) hours as needed for moderate pain or headache.    [provider]  ?allopurinol (ZYLOPRIM) 100 MG tablet Take 100 mg by mouth daily.    [provider]  ?amLODipine (NORVASC) 5 MG tablet Take 1 tablet (5 mg total) by mouth daily. 09/21/21   Patwardhan, Reynold Bowen, MD  ?Aromatic Inhalants (VICKS VAPOINHALER IN) Inhale 1 puff into the lungs daily as needed (congestion).    [provider]  ?aspirin EC 81 MG tablet Take 81 mg by mouth every evening.    [provider]  ?atorvastatin (LIPITOR) 40 MG tablet Take 40 mg by mouth at bedtime.    [provider]  ?calcitRIOL (ROCALTROL) 0.25 MCG capsule Take 0.25 mcg by mouth every Monday, Wednesday, and Friday.    [provider]  ?epoetin alfa-epbx (RETACRIT) 2000 UNIT/ML injection 2,000 Units once. Injects once a month    [provider]  ?finasteride (PROSCAR) 5 MG tablet Take 5 mg by mouth every  evening.    [provider]  ?fluticasone (VERAMYST) 27.5 MCG/SPRAY nasal spray Place 2 sprays into the nose as needed for rhinitis.    [provider]  ?furosemide (LASIX) 80 MG tablet Take 1 tablet (80 mg total) by mouth 2 (two) times daily. 09/21/21 10/21/21  Patwardhan, Reynold Bowen, MD  ?hydrALAZINE (APRESOLINE) 50 MG tablet Take 1 tablet (50 mg total) by mouth in the morning and at bedtime. 04/20/21   Patwardhan, Reynold Bowen, MD  ?hydrOXYzine (ATARAX) 10 MG tablet Take 10 mg by mouth as needed.    [provider]  ?insulin aspart protamine - aspart (NOVOLOG MIX 70/30 FLEXPEN) (70-30) 100 UNIT/ML FlexPen Inject 14-20 Units into the skin daily as needed (high blood sugar). If BS is <180=0 units, If BS is 180-200 units=14 units, If BS is >201=20 units    [provider]  ?isosorbide dinitrate (ISORDIL) 20 MG tablet Take 1 tablet (20 mg total) by mouth 2 (two) times daily. 03/23/21   Patwardhan, Reynold Bowen, MD  ?metoprolol succinate (TOPROL-XL) 50 MG 24 hr tablet Take 1 tablet (50 mg total) by mouth daily. Take with or immediately following a meal. 07/11/20   Florencia Reasons, MD  ?sodium bicarbonate 650 MG tablet Take 650 mg by mouth 2 (two) times daily.    [provider]  ?tamsulosin (FLOMAX) 0.4 MG CAPS capsule Take 1 capsule (0.4 mg total)  by mouth daily after supper. ?Patient taking differently: Take 0.4 mg by mouth at bedtime. 07/11/20   Florencia Reasons, MD  ?   ? ?Allergies    ?Lisinopril, Hydrocodone, Liraglutide, Prednisone, Sitagliptin, Vardenafil, and Penicillins   ? ?Review of Systems   ?Review of Systems  ?Respiratory:  Positive for shortness of breath.   ?All other systems reviewed and are negative. ? ?Physical Exam ?Updated Vital Signs ?BP (!) 106/58 (BP Location: Right Wrist)   Pulse (!) 53   Temp 98 ?F (36.7 ?C)   Resp (!) 25   Ht 5\' 3"  (1.6 m)   Wt 79.8 kg   SpO2 95%   BMI 31.18 kg/m?  ?Physical Exam ?Vitals and nursing note reviewed.  ?Constitutional:   ?   Comments:  Tachypneic, chronically ill  ?HENT:  ?   Head: Normocephalic.  ?   Mouth/Throat:  ?   Mouth: Mucous membranes are moist.  ?Eyes:  ?   Extraocular Movements: Extraocular movements intact.  ?   Pupils: Pupils are equal, round, and reactive to light.  ?Cardiovascular:  ?   Rate and Rhythm: Normal rate and regular rhythm.  ?Pulmonary:  ?   Comments: Tachypneic, crackles bilateral bases ?Abdominal:  ?   General: Bowel sounds are normal.  ?   Palpations: Abdomen is soft.  ?Musculoskeletal:     ?   General: Normal range of motion.  ?   Cervical back: Normal range of motion and neck supple.  ?   Comments: 1+ edema bilateral  ?Skin: ?   General: Skin is warm.  ?   Capillary Refill: Capillary refill takes less than 2 seconds.  ?Neurological:  ?   General: No focal deficit present.  ?   Mental Status: He is oriented to person, place, and time.  ?Psychiatric:     ?   Mood and Affect: Mood normal.     ?   Behavior: Behavior normal.  ? ? ?ED Results / Procedures / Treatments   ?Labs ?(all labs ordered are listed, but only abnormal results are displayed) ?Labs Reviewed  ?CBC WITH DIFFERENTIAL/PLATELET - Abnormal; Notable for the following components:  ?    Result Value  ? RBC 2.82 (*)   ? Hemoglobin 8.8 (*)   ? HCT 27.0 (*)   ? RDW 17.0 (*)   ? Platelets 138 (*)   ? Lymphs Abs 0.5 (*)   ? All other components within normal limits  ?RESP PANEL BY RT-PCR (FLU A&B, COVID) ARPGX2  ?BRAIN NATRIURETIC PEPTIDE  ?COMPREHENSIVE METABOLIC PANEL  ?TROPONIN I (HIGH SENSITIVITY)  ? ? ?EKG ?None ? ?Radiology ?DG Chest Portable 1 View ? ?Result Date: 10/13/2021 ?CLINICAL DATA:  Short of breath EXAM: PORTABLE CHEST 1 VIEW COMPARISON:  08/25/2021 FINDINGS: Cardiac enlargement with mild vascular congestion. Mild bibasilar atelectasis or infiltrate has progressed. Small bilateral effusions are slightly improved. IMPRESSION: Mild fluid overload with cardiac enlargement, vascular congestion and small effusion Mild bibasilar atelectasis/infiltrate.  Electronically Signed   By: Franchot Gallo M.D.   On: 10/13/2021 16:07   ? ?Procedures ?Procedures  ? ? ?Medications Ordered in ED ?Medications - No data to display ? ?ED Course/ Medical Decision Making/ A&P ?  ?                        ?Medical Decision Making ?Robert Lucas is a 81 y.o. male history of ESRD, CHF here presenting with shortness of breath and leg swelling.  I am  concerned for possible worsening heart failure versus renal failure.  We will get CBC CMP and BNP and troponin and chest x-ray.  Patient will likely need admission for diuresis and may need to be started on dialysis.  ?  ?5:14 PM ?BNP is elevated at 1500.  Patient also has worsening renal failure.  I discussed case with Dr. Candiss Norse from nephrology.  He recommend Lasix 80 IV twice daily.  He states that patient can go to [facility] and nephrology will see patient as consult and may start dialysis if his renal function does not improve.  Dr. Tera Helper from hospitalist accepted patient for admission  ? ?Problems Addressed: ?Acute on chronic congestive heart failure, unspecified heart failure type [facility]): acute illness or injury ?ESRD (end stage renal disease) ([place]): acute illness or injury ? ?Amount and/or Complexity of Data Reviewed ?Labs: ordered. Decision-making details documented in ED Course. ?Radiology: ordered and independent interpretation performed. Decision-making details documented in ED Course. ?ECG/medicine tests: ordered and independent interpretation performed. Decision-making details documented in ED Course. ? ?Risk ?Prescription drug management. ?Decision regarding hospitalization. ? ? ?Final Clinical Impression(s) / ED Diagnoses ?Final diagnoses:  ?None  ? ? ?Rx / DC Orders ?ED Discharge Orders   ? ? None  ? ?  ? ? ?  ?Drenda Freeze, MD ?10/13/21 1716 ? ?

## 2021-10-13 NOTE — ED Notes (Signed)
RT Note: Repeat Troponin obtained/labelled/given to lab, RN aware. ?

## 2021-10-13 NOTE — H&P (Addendum)
?History and Physical  ? ? ?Robert Lucas UQJ:[id number] DOB: 1940-07-13 DOA: 10/13/2021 ? ?PCP: [name], NP  ?Patient coming from: Home. ? ?Chief Complaint: Shortness of breath. ? ?HPI: Robert Lucas is a 81 y.o. male with known history of chronic systolic and diastolic CHF last EF measured in February 2023 was 30 to 35% with grade 2 diastolic dysfunction, CAD status post PTCA, chronic kidney disease stage V admitted in February for fluid overload improved with diuresis has been having increasing worsening shortness of breath for the last few weeks presently will address denies any associated chest pain.  Has been having some productive cough for the last 2 weeks.  Denies fever chills.  Has been making urine.  Present to the ER at med center. ? ?ED Course: In the ER patient was short of breath but not hypoxic chest x-ray shows congestion and possible infiltrate.  Patient has been hypothermic in the ER and had consistently remained hypothermic with temperature of 94 ?F.  Lab work show creatinine of 6.95 which has progressively worsened from 4.8 in September 07, 2021 and is around 6.08 in October 05, 2021 that is around a week ago and it is around 6.95.  Potassium is 4.3 bicarb of 16 anion gap of 16.  BNP is 1500 and I sensitive troponin was 30 and 27.  ER physician discussed with on-call nephrologist who advised patient to be placed on 80 mg IV Lasix.  On my exam patient's abdomen appears distended and chest appears mildly rhonchorous.  Patient's blood pressure shortly after I admitted has been in the low normal's.  Patient still remains hypothermic.  Plan is to place patient on Bair hugger and get blood cultures and since chest x-ray does show infiltrate and patient is having productive cough we will start patient on antibiotics. ? ?Review of Systems: As per HPI, rest all negative. ? ? ?Past Medical History:  ?Diagnosis Date  ? Arthritis   ? Bell's palsy   ? one episode  ? CKD (chronic kidney disease)   ? sees [place]  in [place]  ? Diabetes ([place])   ? type 2   sees endo at [place] in [place]  ? Hypercholesteremia   ? Hypertension   ? Myocardial infarction [facility]) 1990  ? Neuromuscular disorder ([place])   ? PONV (postoperative nausea and vomiting)   ? ? ?Past Surgical History:  ?Procedure Laterality Date  ? AV FISTULA PLACEMENT Left 05/17/2021  ? Procedure: LEFT ARM ARTERIOVENOUS (AV) FISTULA CREATION;  Surgeon: Broadus John, MD;  Location: [facility] OR;  Service: Vascular;  Laterality: Left;  PERIPHERAL NERVE BLOCK  ? BACK SURGERY  yrs ago  ? lower  ? CARDIAC CATHETERIZATION    ? CATARACT EXTRACTION Right 01/2021  ? CHOLECYSTECTOMY N/A 05/02/2017  ? Procedure: LAPAROSCOPIC CHOLECYSTECTOMY;  Surgeon: Ralene Ok, MD;  Location: [facility];  Service: General;  Laterality: N/A;  ? CORONARY ANGIOPLASTY    ? 2 1990  ? EYE SURGERY Left 03/2021  ? cataract removal  ? KNEE SURGERY Left yrs ago  ? arthroscopic  ? ROTATOR CUFF REPAIR Left yrs ago  ? SHOULDER OPEN ROTATOR CUFF REPAIR Right 04/23/2013  ? Procedure: RIGHT SHOULDER ROTATOR CUFF REPAIR WITH GRAFT AND ANCHORS ;  Surgeon: Tobi Bastos, MD;  Location: [facility] ORS;  Service: Orthopedics;  Laterality: Right;  ? TONSILLECTOMY    ? TOTAL HIP ARTHROPLASTY Right 05/06/2019  ? Procedure: TOTAL HIP ARTHROPLASTY ANTERIOR APPROACH;  Surgeon: Paralee Cancel, MD;  Location: [facility] ORS;  Service: Orthopedics;  Laterality: Right;  70 mins  ? ? ? reports that he quit smoking about 33 years ago. His smoking use included cigars. He has never used smokeless tobacco. He reports that he does not currently use alcohol. He reports that he does not use drugs. ? ?Allergies  ?Allergen Reactions  ? Lisinopril Cough  ? Hydrocodone Nausea Only  ? Liraglutide   ?  GI upset  ? Prednisone   ?  Elevates BGL; patient prefers to not take this  ? Sitagliptin   ?  Unknown  ? Vardenafil   ?  Other reaction(s): flushing  ? Penicillins Rash and Other (See Comments)  ?  Rash around ankles ?Has patient had a PCN reaction causing  immediate rash, facial/tongue/throat swelling, SOB or lightheadedness with hypotension: Yes ?Has patient had a PCN reaction causing severe rash involving mucus membranes or skin necrosis: Unk ?Has patient had a PCN reaction that required hospitalization: No ?Has patient had a PCN reaction occurring within the last 10 years: No ?If all of the above answers are "NO", then may proceed with Cephalosporin use. ?  ? ? ?Family History  ?Problem Relation Age of Onset  ? Dementia Mother   ? Stroke Brother   ? ? ?Prior to Admission medications   ?Medication Sig Start Date End Date Taking? Authorizing Provider  ?acetaminophen (TYLENOL) 500 MG tablet Take 500 mg by mouth every 6 (six) hours as needed for moderate pain or headache.    [provider]  ?allopurinol (ZYLOPRIM) 100 MG tablet Take 100 mg by mouth daily.    [provider]  ?amLODipine (NORVASC) 5 MG tablet Take 1 tablet (5 mg total) by mouth daily. 09/21/21   Patwardhan, Reynold Bowen, MD  ?Aromatic Inhalants (VICKS VAPOINHALER IN) Inhale 1 puff into the lungs daily as needed (congestion).    [provider]  ?aspirin EC 81 MG tablet Take 81 mg by mouth every evening.    [provider]  ?atorvastatin (LIPITOR) 40 MG tablet Take 40 mg by mouth at bedtime.    [provider]  ?calcitRIOL (ROCALTROL) 0.25 MCG capsule Take 0.25 mcg by mouth every Monday, Wednesday, and Friday.    [provider]  ?epoetin alfa-epbx (RETACRIT) 2000 UNIT/ML injection 2,000 Units once. Injects once a month    [provider]  ?finasteride (PROSCAR) 5 MG tablet Take 5 mg by mouth every evening.    [provider]  ?fluticasone (VERAMYST) 27.5 MCG/SPRAY nasal spray Place 2 sprays into the nose as needed for rhinitis.    [provider]  ?furosemide (LASIX) 80 MG tablet Take 1 tablet (80 mg total) by mouth 2 (two) times daily. 09/21/21 10/21/21  Patwardhan, Reynold Bowen, MD  ?hydrALAZINE (APRESOLINE) 50 MG tablet Take 1  tablet (50 mg total) by mouth in the morning and at bedtime. 04/20/21   Patwardhan, Reynold Bowen, MD  ?hydrOXYzine (ATARAX) 10 MG tablet Take 10 mg by mouth as needed.    [provider]  ?insulin aspart protamine - aspart (NOVOLOG MIX 70/30 FLEXPEN) (70-30) 100 UNIT/ML FlexPen Inject 14-20 Units into the skin daily as needed (high blood sugar). If BS is <180=0 units, If BS is 180-200 units=14 units, If BS is >201=20 units    [provider]  ?isosorbide dinitrate (ISORDIL) 20 MG tablet Take 1 tablet (20 mg total) by mouth 2 (two) times daily. 03/23/21   Patwardhan, Reynold Bowen, MD  ?metoprolol succinate (TOPROL-XL) 50 MG 24 hr tablet Take 1 tablet (  50 mg total) by mouth daily. Take with or immediately following a meal. 07/11/20   Florencia Reasons, MD  ?sodium bicarbonate 650 MG tablet Take 650 mg by mouth 2 (two) times daily.    [provider]  ?tamsulosin (FLOMAX) 0.4 MG CAPS capsule Take 1 capsule (0.4 mg total) by mouth daily after supper. ?Patient taking differently: Take 0.4 mg by mouth at bedtime. 07/11/20   Florencia Reasons, MD  ? ? ?Physical Exam: ?Constitutional: Moderately built and nourished. ?Vitals:  ? 10/13/21 2034 10/13/21 2118 10/13/21 2223 10/13/21 2302  ?BP:  133/63  133/76  ?Pulse:  (!) 49  (!) 55  ?Resp:  19  19  ?Temp: (!) 94.1 ?F (34.5 ?C) (!) 94.2 ?F (34.6 ?C) (!) 94 ?F (34.4 ?C)   ?TempSrc: Rectal Rectal Rectal   ?SpO2:  94%  97%  ?Weight:      ?Height:      ? ?Eyes: Anicteric no pallor. ?ENMT: No discharge from the ears eyes nose and mouth. ?Neck: No mass felt.  No neck rigidity.  JVD not appreciated. ?Respiratory: Bilateral rhonchi and crepitations. ?Cardiovascular: S1-S2 heard. ?Abdomen: Distended nontender. ?Musculoskeletal: No edema. ?Skin: No rash. ?Neurologic: Alert awake oriented to time place and person.  Moves all extremities. ?Psychiatric: Appears normal.  Normal affect. ? ? ?Labs on Admission: I have personally reviewed following labs and imaging studies ? ?CBC: ?Recent Labs   ?Lab 10/13/21 ?1555  ?WBC 5.4  ?NEUTROABS 4.3  ?HGB 8.8*  ?HCT 27.0*  ?MCV 95.7  ?PLT 138*  ? ?Basic Metabolic Panel: ?Recent Labs  ?Lab 10/13/21 ?1555  ?NA 132*  ?K 4.3  ?CL 100  ?CO2 16*  ?GLUCOSE 244*  ?

## 2021-10-13 NOTE — ED Notes (Signed)
RT Note: Pt. seen in Triage-1, Hx. CKD along with (+) weight gain, no documented COPD. ?

## 2021-10-13 NOTE — ED Triage Notes (Signed)
Pt c/o SOB for several weeks. Pt states that it is getting harder to breather especially at night. Pt is occasionally labored with audible expiratory wheezes.  Pt denis Pain. GCS 15 ?

## 2021-10-13 NOTE — ED Notes (Addendum)
Secure chat to admitting provider Dr Tracie Harrier and receiving nurse Jason Fila., RN (unit 20M) to notify of current rectal temp 94.42F - warm blankets applied   ?

## 2021-10-13 NOTE — ED Notes (Signed)
Called [facility] to transport patient to [facility] 24M room 16 ?

## 2021-10-13 NOTE — Progress Notes (Signed)
Contacted from [facility] in regards to this patient. CKD5 (follows with Dr. Joelyn Oms, w/ LUEAVF) p/w SOB, found to have vascular congestion on CXR. Pertinent lab findings: hyponatremia, acidosis, worsening kidney function, elevated BNP. Recommend lasix 80mg  IV BID, can increase to TID if needed. If no significant response to diuretics and/or exhibiting uremic symptoms, would then consider initiation of HD after we discuss this with patient. Not sure if his AVF is functional per his previous [facility] appt in Feb, may benefit from a VVS consultation before initiating HD. Full consult/evaluation to follow once patient comes over to [facility]. Please call with any questions/concerns in the interim. ? ?Gean Quint, MD ?[facility] ?

## 2021-10-13 NOTE — Progress Notes (Signed)
New Admission Note:  ? ?Arrival Method: Arrived from [facility] ED via [facility] ?Mental Orientation: Alert and oriented x4 ?Telemetry: Box #[id number] ?Assessment: Completed ?Skin: See doc flowsheet ?IV: Rt AC ?Pain: 0/10 ?Tubes: N/A ?Safety Measures: Safety Fall Prevention Plan has been discussed.  ?Admission: Completed ?5MW Orientation: Patient has been oriented to the room, unit and staff.  ?Family: None at bedside ? ?Orders have been reviewed and implemented. Will continue to monitor the patient. Call light has been placed within reach and bed alarm has been activated.  ? ?Interior and spatial designer, RN-BC ?Phone number: [phone]  ?

## 2021-10-14 ENCOUNTER — Encounter (HOSPITAL_COMMUNITY): Payer: Self-pay | Admitting: Internal Medicine

## 2021-10-14 ENCOUNTER — Inpatient Hospital Stay (HOSPITAL_COMMUNITY): Payer: Medicare Other

## 2021-10-14 ENCOUNTER — Encounter (HOSPITAL_COMMUNITY): Payer: Medicare Other

## 2021-10-14 DIAGNOSIS — E1169 Type 2 diabetes mellitus with other specified complication: Secondary | ICD-10-CM

## 2021-10-14 DIAGNOSIS — G4733 Obstructive sleep apnea (adult) (pediatric): Secondary | ICD-10-CM | POA: Diagnosis present

## 2021-10-14 DIAGNOSIS — T68XXXA Hypothermia, initial encounter: Secondary | ICD-10-CM

## 2021-10-14 DIAGNOSIS — N185 Chronic kidney disease, stage 5: Secondary | ICD-10-CM

## 2021-10-14 DIAGNOSIS — E8770 Fluid overload, unspecified: Secondary | ICD-10-CM | POA: Diagnosis not present

## 2021-10-14 DIAGNOSIS — I10 Essential (primary) hypertension: Secondary | ICD-10-CM | POA: Diagnosis not present

## 2021-10-14 DIAGNOSIS — I5023 Acute on chronic systolic (congestive) heart failure: Secondary | ICD-10-CM

## 2021-10-14 DIAGNOSIS — E785 Hyperlipidemia, unspecified: Secondary | ICD-10-CM

## 2021-10-14 DIAGNOSIS — N179 Acute kidney failure, unspecified: Secondary | ICD-10-CM | POA: Diagnosis not present

## 2021-10-14 DIAGNOSIS — E662 Morbid (severe) obesity with alveolar hypoventilation: Secondary | ICD-10-CM | POA: Diagnosis present

## 2021-10-14 LAB — CBC WITH DIFFERENTIAL/PLATELET
Abs Immature Granulocytes: 0.02 10*3/uL (ref 0.00–0.07)
Abs Immature Granulocytes: 0.04 10*3/uL (ref 0.00–0.07)
Basophils Absolute: 0 10*3/uL (ref 0.0–0.1)
Basophils Absolute: 0 10*3/uL (ref 0.0–0.1)
Basophils Relative: 0 %
Basophils Relative: 0 %
Eosinophils Absolute: 0.1 10*3/uL (ref 0.0–0.5)
Eosinophils Absolute: 0.1 10*3/uL (ref 0.0–0.5)
Eosinophils Relative: 2 %
Eosinophils Relative: 1 %
HCT: 25.7 % — ABNORMAL LOW (ref 39.0–52.0)
HCT: 25.7 % — ABNORMAL LOW (ref 39.0–52.0)
Hemoglobin: 8.3 g/dL — ABNORMAL LOW (ref 13.0–17.0)
Hemoglobin: 8.3 g/dL — ABNORMAL LOW (ref 13.0–17.0)
Immature Granulocytes: 0 %
Immature Granulocytes: 1 %
Lymphocytes Relative: 9 %
Lymphocytes Relative: 10 %
Lymphs Abs: 0.4 10*3/uL — ABNORMAL LOW (ref 0.7–4.0)
Lymphs Abs: 0.5 10*3/uL — ABNORMAL LOW (ref 0.7–4.0)
MCH: 31.3 pg (ref 26.0–34.0)
MCH: 31.6 pg (ref 26.0–34.0)
MCHC: 32.3 g/dL (ref 30.0–36.0)
MCHC: 32.3 g/dL (ref 30.0–36.0)
MCV: 97 fL (ref 80.0–100.0)
MCV: 97.7 fL (ref 80.0–100.0)
Monocytes Absolute: 0.5 10*3/uL (ref 0.1–1.0)
Monocytes Absolute: 0.4 10*3/uL (ref 0.1–1.0)
Monocytes Relative: 10 %
Monocytes Relative: 8 %
Neutro Abs: 3.8 10*3/uL (ref 1.7–7.7)
Neutro Abs: 3.8 10*3/uL (ref 1.7–7.7)
Neutrophils Relative %: 79 %
Neutrophils Relative %: 80 %
Platelets: 103 10*3/uL — ABNORMAL LOW (ref 150–400)
Platelets: 105 10*3/uL — ABNORMAL LOW (ref 150–400)
RBC: 2.65 MIL/uL — ABNORMAL LOW (ref 4.22–5.81)
RBC: 2.63 MIL/uL — ABNORMAL LOW (ref 4.22–5.81)
RDW: 16.9 % — ABNORMAL HIGH (ref 11.5–15.5)
RDW: 17 % — ABNORMAL HIGH (ref 11.5–15.5)
WBC: 4.8 10*3/uL (ref 4.0–10.5)
WBC: 4.7 10*3/uL (ref 4.0–10.5)
nRBC: 0.4 % — ABNORMAL HIGH (ref 0.0–0.2)
nRBC: 0 % (ref 0.0–0.2)

## 2021-10-14 LAB — COMPREHENSIVE METABOLIC PANEL WITH GFR
ALT: 17 U/L (ref 0–44)
AST: 18 U/L (ref 15–41)
Albumin: 3.2 g/dL — ABNORMAL LOW (ref 3.5–5.0)
Alkaline Phosphatase: 71 U/L (ref 38–126)
Anion gap: 15 (ref 5–15)
BUN: 121 mg/dL — ABNORMAL HIGH (ref 8–23)
CO2: 15 mmol/L — ABNORMAL LOW (ref 22–32)
Calcium: 7.4 mg/dL — ABNORMAL LOW (ref 8.9–10.3)
Chloride: 105 mmol/L (ref 98–111)
Creatinine, Ser: 7.4 mg/dL — ABNORMAL HIGH (ref 0.61–1.24)
GFR, Estimated: 7 mL/min — ABNORMAL LOW
Glucose, Bld: 167 mg/dL — ABNORMAL HIGH (ref 70–99)
Potassium: 4.3 mmol/L (ref 3.5–5.1)
Sodium: 135 mmol/L (ref 135–145)
Total Bilirubin: 1 mg/dL (ref 0.3–1.2)
Total Protein: 5.9 g/dL — ABNORMAL LOW (ref 6.5–8.1)

## 2021-10-14 LAB — COMPREHENSIVE METABOLIC PANEL
ALT: 18 U/L (ref 0–44)
AST: 15 U/L (ref 15–41)
Albumin: 3 g/dL — ABNORMAL LOW (ref 3.5–5.0)
Alkaline Phosphatase: 71 U/L (ref 38–126)
Anion gap: 13 (ref 5–15)
BUN: 118 mg/dL — ABNORMAL HIGH (ref 8–23)
CO2: 15 mmol/L — ABNORMAL LOW (ref 22–32)
Calcium: 7.3 mg/dL — ABNORMAL LOW (ref 8.9–10.3)
Chloride: 104 mmol/L (ref 98–111)
Creatinine, Ser: 7.17 mg/dL — ABNORMAL HIGH (ref 0.61–1.24)
GFR, Estimated: 7 mL/min — ABNORMAL LOW (ref >60)
Glucose, Bld: 183 mg/dL — ABNORMAL HIGH (ref 70–99)
Potassium: 4.3 mmol/L (ref 3.5–5.1)
Sodium: 132 mmol/L — ABNORMAL LOW (ref 135–145)
Total Bilirubin: 0.8 mg/dL (ref 0.3–1.2)
Total Protein: 6.2 g/dL — ABNORMAL LOW (ref 6.5–8.1)

## 2021-10-14 LAB — GLUCOSE, CAPILLARY
Glucose-Capillary: 171 mg/dL — ABNORMAL HIGH (ref 70–99)
Glucose-Capillary: 170 mg/dL — ABNORMAL HIGH (ref 70–99)
Glucose-Capillary: 158 mg/dL — ABNORMAL HIGH (ref 70–99)
Glucose-Capillary: 232 mg/dL — ABNORMAL HIGH (ref 70–99)

## 2021-10-14 LAB — LACTIC ACID, PLASMA
Lactic Acid, Venous: 0.7 mmol/L (ref 0.5–1.9)
Lactic Acid, Venous: 0.6 mmol/L (ref 0.5–1.9)

## 2021-10-14 LAB — HEPATITIS B SURFACE ANTIGEN: Hepatitis B Surface Ag: NONREACTIVE

## 2021-10-14 LAB — MAGNESIUM: Magnesium: 1.6 mg/dL — ABNORMAL LOW (ref 1.7–2.4)

## 2021-10-14 LAB — PHOSPHORUS: Phosphorus: 10.4 mg/dL — ABNORMAL HIGH (ref 2.5–4.6)

## 2021-10-14 LAB — HIV ANTIBODY (ROUTINE TESTING W REFLEX): HIV Screen 4th Generation wRfx: NONREACTIVE

## 2021-10-14 MED ORDER — FUROSEMIDE 10 MG/ML IJ SOLN
120.0000 mg | Freq: Four times a day (QID) | INTRAVENOUS | Status: AC
  Administered 2021-10-14 – 2021-10-15 (×2): 120 mg via INTRAVENOUS
  Filled 2021-10-14: qty 12
  Filled 2021-10-14: qty 2
  Filled 2021-10-14: qty 12

## 2021-10-14 MED ORDER — ALTEPLASE 2 MG IJ SOLR
2.0000 mg | Freq: Once | INTRAMUSCULAR | Status: DC | PRN
  Filled 2021-10-14: qty 2

## 2021-10-14 MED ORDER — SODIUM CHLORIDE 0.9 % IV SOLN: 100.0000 mL | INTRAVENOUS | Status: DC | PRN

## 2021-10-14 MED ORDER — HYDRALAZINE HCL 20 MG/ML IJ SOLN
10.0000 mg | INTRAMUSCULAR | Status: DC | PRN
  Administered 2021-10-16: 10 mg via INTRAVENOUS
  Filled 2021-10-14: qty 1

## 2021-10-14 MED ORDER — CHLORHEXIDINE GLUCONATE CLOTH 2 % EX PADS
6.0000 | MEDICATED_PAD | Freq: Every day | CUTANEOUS | Status: DC
  Administered 2021-10-15 – 2021-10-19 (×4): 6 via TOPICAL

## 2021-10-14 MED ORDER — HEPARIN SODIUM (PORCINE) 1000 UNIT/ML DIALYSIS
1000.0000 [IU] | INTRAMUSCULAR | Status: DC | PRN
  Filled 2021-10-14 (×2): qty 1

## 2021-10-14 MED ORDER — PENTAFLUOROPROP-TETRAFLUOROETH EX AERO: 1.0000 "application " | INHALATION_SPRAY | CUTANEOUS | Status: DC | PRN

## 2021-10-14 MED ORDER — SODIUM BICARBONATE 650 MG PO TABS
1300.0000 mg | ORAL_TABLET | Freq: Two times a day (BID) | ORAL | Status: DC
Start: 2021-10-14 — End: 2021-10-16
  Administered 2021-10-14 – 2021-10-15 (×2): 1300 mg via ORAL
  Filled 2021-10-14 (×2): qty 2

## 2021-10-14 MED ORDER — HEPARIN SODIUM (PORCINE) 5000 UNIT/ML IJ SOLN
5000.0000 [IU] | Freq: Three times a day (TID) | INTRAMUSCULAR | Status: DC
  Administered 2021-10-15 – 2021-10-19 (×10): 5000 [IU] via SUBCUTANEOUS
  Filled 2021-10-14 (×10): qty 1

## 2021-10-14 NOTE — Progress Notes (Addendum)
?PROGRESS NOTE ? ? ? ?Robert Lucas  QQP:[id number] DOB: [birth year] DOA: 10/13/2021 ?PCP: [name], NP  ? ?Brief Narrative:  ? Robert Lucas is a 81 y.o. WM PMHx chronic systolic and diastolic CHF last EF measured in February 2023 was 30 to 35% with grade 2 diastolic dysfunction, CAD S/P PTCA, CKD stage V admitted in February for fluid overload improved with diuresis.  OSA/OHS ? ?Has been having increasing worsening shortness of breath for the last few weeks presently will address denies any associated chest pain.  Has been having some productive cough for the last 2 weeks.  Denies fever chills.  Has been making urine.  Present to the ER at med center. ?  ?ED Course: In the ER patient was short of breath but not hypoxic chest x-ray shows congestion and possible infiltrate.  Patient has been hypothermic in the ER and had consistently remained hypothermic with temperature of 94 ?F.  Lab work show creatinine of 6.95 which has progressively worsened from 4.8 in September 07, 2021 and is around 6.08 in October 05, 2021 that is around a week ago and it is around 6.95.  Potassium is 4.3 bicarb of 16 anion gap of 16.  BNP is 1500 and I sensitive troponin was 30 and 27.  ER physician discussed with on-call nephrologist who advised patient to be placed on 80 mg IV Lasix.  On my exam patient's abdomen appears distended and chest appears mildly rhonchorous.  Patient's blood pressure shortly after I admitted has been in the low normal's.  Patient still remains hypothermic.  Plan is to place patient on Bair hugger and get blood cultures and since chest x-ray does show infiltrate and patient is having productive cough we will start patient on antibiotics. ? ? ?Subjective: ?4/14 patient somewhat sleepy but arousable A/O x4.  Negative SOB.  Negative nausea.  Negative abdominal pain. ? ? ?Assessment & Plan: ? Covid vaccination; ? ?Principal Problem: ?  Fluid overload ?Active Problems: ?  Type 2 diabetes mellitus with hyperlipidemia  ([place]) ?  Essential hypertension ?  Acute on chronic systolic CHF (congestive heart failure) ([place]) ?  Acute renal failure superimposed on stage 4 chronic kidney disease ([place]) ?  Hypothermia ?  ARF (acute renal failure) ([place]) ? ?Acute on CKD stage V (baseline Cr 4.8 in September 07, 2021) ?Lab Results  ?Component Value Date  ? CREATININE 7.17 (H) 10/14/2021  ? CREATININE 6.95 (H) 10/13/2021  ? CREATININE 6.08 (H) 10/05/2021  ? CREATININE 4.87 (H) 09/07/2021  ? CREATININE 5.08 (H) 08/30/2021  ?-4/14 discussed case with Dr Joylene Grapes [facility] they are familiar with patient, have seen him in clinic before.  Plan is for placement of Vas-Cath by IR and then HD. ?-4/14 diuretics per Nephrology ? ?DM nephropathy ? ?Acute on chronic systolic CHF ?-February 7124 EF of 30 to 35% with grade 2 diastolic dysfunction ?- Strict in and out ?- Daily weight ? ?Essential HTN ?- PRN hydralazine ? ?CAD ?- Trend cardiac markers, ?-4/13 check BNP= 1500.6 ? ?DM type II controlled with complication ?- 4/14 A1c pending ?- 414 lipid panel pending ? 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 ?- Anemia panel pending ? ?OSA ?- CPAP per respiratory ? ?Gout ?- Uric acid pending ? ?Diarrhea ?- 4/14 patient states has not had any episodes of diarrhea in the last 24 hours. ? ? ? ? ? ? ?s. ?Hypothermia with patient having productive cough concerning for possible infection likely pneumonia for which I have ordered blood cultures cortisol TSH  levels.  We will keep patient on empiric antibiotics for pneumonia.  Closely monitor. ?History of hypertension -shortly after admission patient blood pressure has been in the low normal's.  We will hold antihypertensive.  We will follow blood pressure trends.  Check lactic acid levels and blood cultures.  As needed IV hydralazine for systolic more than 250. ?Acute on chronic systolic CHF last EF measured was 30 to 35% with grade 2 diastolic dysfunction in February 2023.  Holding antihypertensives due to low normal blood pressure.  See  #1. ?Diabetes mellitus type 2 we will keep patient on sliding scale coverage. ?Anemia likely from renal disease follow CBC. ?History of CAD status post tenting denies any chest pain.  Cardiac markers stable.  BNP elevated due to fluid overload. ?Thrombocytopenia -appears to be new.  Follow CBC. ?History of sleep apnea. ?History of gout on allopurinol. ?Addendum -patient had 2-3 episodes of loose watery diarrhea.  We will check stool studies. ?  ?Since patient has worsening renal function and also hypothermia concerning for infection will need close monitoring for any further worsening and inpatient status. ?  ?OSA/OHS ?- 4/14 CPAP per respiratory therapy settings ? ?Body mass index is 31.18 kg/m?. ? ? ?Mobility Assessment (last 72 hours)   ? ? Mobility Assessment   ? ? Dana Name 10/13/21 2120  ?  ?  ?  ?  ? Does patient have an order for bedrest or is patient medically unstable No - Continue assessment      ? What is the highest level of mobility based on the progressive mobility assessment? Level 5 (Walks with assist in room/hall) - Balance while stepping forward/back and can walk in room with assist - Complete      ? ?  ?  ? ?  ? ? ?DVT prophylaxis:  ?Code Status:  ?Family Communication:  ?Status is: Inpatient ? ? ? ?Dispo: The patient is from: Home ?             Anticipated d/c is to: Home ?             Anticipated d/c date is: 3 days ?             Patient currently is not medically stable to d/c. ? ? ? ? ? ?Consultants:  ?Nephrology ? ? ?Procedures/Significant Events:  ? ? ? ?I have personally reviewed and interpreted all radiology studies and my findings are as above. ? ?VENTILATOR SETTINGS: ? ? ? ?Cultures ? ? ?Antimicrobials: ? ? ? ?Devices ?  ? ?LINES / TUBES:  ? ? ? ? ?Continuous Infusions: ? azithromycin Stopped (10/14/21 0230)  ? cefTRIAXone (ROCEPHIN)  IV Stopped (10/14/21 0053)  ? ? ? ?Objective: ?Vitals:  ? 10/14/21 0210 10/14/21 0426 10/14/21 0535 10/14/21 0805  ?BP: (!) 90/45 121/60  (!) 111/50   ?Pulse: 60 61  66  ?Resp: 18   18  ?Temp: (!) 95.2 ?F (35.1 ?C) 98 ?F (36.7 ?C) (!) 95.4 ?F (35.2 ?C) (!) 97.5 ?F (36.4 ?C)  ?TempSrc: Rectal  Rectal Oral  ?SpO2: 97% 93%  95%  ?Weight:      ?Height:      ? ? ?Intake/Output Summary (Last 24 hours) at 10/14/2021 0934 ?Last data filed at 10/14/2021 0700 ?Gross per 24 hour  ?Intake 350.16 ml  ?Output 1 ml  ?Net 349.16 ml  ? ?Filed Weights  ? 10/13/21 1522  ?Weight: 79.8 kg  ? ? ?Examination: ? ?General: A/O x4 No acute respiratory distress ?Eyes: negative  scleral hemorrhage, negative anisocoria, negative icterus ?ENT: Negative Runny nose, negative gingival bleeding, ?Neck:  Negative scars, masses, torticollis, lymphadenopathy, JVD ?Lungs: Clear to auscultation bilaterally without wheezes or crackles ?Cardiovascular: Regular rate and rhythm without murmur gallop or rub normal S1 and S2 ?Abdomen: negative abdominal pain, nondistended, positive soft, bowel sounds, no rebound, no ascites, no appreciable mass ?Extremities: No significant cyanosis, clubbing, or edema bilateral lower extremities ?Skin: Negative rashes, lesions, ulcers ?Psychiatric:  Negative depression, negative anxiety, negative fatigue, negative mania  ?Central nervous system:  Cranial nerves II through XII intact, tongue/uvula midline, all extremities muscle strength 5/5, sensation intact throughout, negative dysarthria, negative expressive aphasia, negative receptive aphasia. ? ?.  ? ? ? ?Data Reviewed: Care during the described time interval was provided by me .  I have reviewed this patient's available data, including medical history, events of note, physical examination, and all test results as part of my evaluation.  ? ?CBC: ?Recent Labs  ?Lab 10/13/21 ?1555 10/14/21 ?0257  ?WBC 5.4 4.8  ?NEUTROABS 4.3 3.8  ?HGB 8.8* 8.3*  ?HCT 27.0* 25.7*  ?MCV 95.7 97.0  ?PLT 138* 103*  ? ?Basic Metabolic Panel: ?Recent Labs  ?Lab 10/13/21 ?1555 10/14/21 ?0257  ?NA 132* 132*  ?K 4.3 4.3  ?CL 100 104  ?CO2 16* 15*   ?GLUCOSE 244* 183*  ?BUN 118* 118*  ?CREATININE 6.95* 7.17*  ?CALCIUM 7.8* 7.3*  ? ?GFR: ?Estimated Creatinine Clearance: 7.4 mL/min (A) (by C-G formula based on SCr of 7.17 mg/dL (H)). ?Liver Function Tests: ?

## 2021-10-14 NOTE — Consult Note (Signed)
Nephrology Consult  ? ?Requesting provider: Dr. Hal Hope ?Service requesting consult: Internal Medicine ?Reason for consult: CKD V ? ? ?Assessment/Recommendations: Robert Lucas is a/an 81 y.o. male with a past medical history of chronic systolic and diastolic HF (EF 56-38%, V5IE), CAD s/p PTCA, HTN, T2DM, CKD V who presents with Robert Lucas and volume overload. ? ?New ESRD, Uremia: Likely secondary to longstanding HTN and T2DM. Has CKD V at baseline and is grossly volume overloaded on exam. Does have significant uremic symptoms on exam as well with BUN 121. He has a left BC AVF that was placed in 05/2021 but has not yet fully matured. He will need dialysis given presence of profound volume overload and uremia. Vascular following, appreciate assistance, will obtain duplex to reassess maturity of AVF. He will be getting a tunneled HD catheter placed today to use for dialysis until fistula is fully mature, appreciate IR assistance. Will receive first HD session after HD cath placement. ?-Tunneled HD cath placement today ?-1st session of HD after cath placement ?-f/u duplex study ?-continue lasix today to help with fluid overload (still producing urine) ?-trend renal function ?-Renally dose medications ?-Strict I/Os , Daily weights  ?-Maintain MAP>65 for optimal renal perfusion.  ?-Avoid nephrotoxic medications including NSAIDs ?-Use synthetic opioids (Fentanyl/Dilaudid) if needed ?-continue home calcitriol and sodium bicarb ? ?Metabolic Acidosis - likely 2/2 underlying renal disease. Expect improvement with HD. ? ?Suspected CAP: management per primary. Awaiting blood cultures. On rocephin and azithromycin. ?Suspected GI illness: management per primary. Stool studies pending, on enteric precautions. ? ?Anemia due to : ?-iron deficiency noted on labs from 10/05/21, will wait to replete given concern for acute infection ?-can restart home ESA after iron repletion ?-trend CBC ?-Transfuse for Hgb<7 g/dL ? ?Chronic systolic and  diastolic HF - EF 33-29%, Robert Lucas on most recent ECHO. Volume overloaded, but expect improvement with HD. Can slowly restart GDMT from tomorrow as tolerated and as BP allows. ? ?Hypertension: ?-BP intermittently low-normal ?-on hydralazine 10mg  q4 prn for SBP >160 ?-hold home antihypertensives today to assess response to HD ? ?Diabetes Mellitus Type 2 with Hyperglycemia: ?-on chart review, appears to be well controlled (<7 for past 2 years), on novolog 70-30 at home (14-20u daily)  ?-CBGs relatively well controlled ?-f/u repeat A1c ?-SSI ? ?Recommendations conveyed to primary service.  ? ? ?Robert Lucas ?[facility] PGY-2 ?10/14/2021 ?11:04 AM ? ? ?_____________________________________________________________________________________ ?CC: Robert Lucas, volume overload ? ?History of Present Illness: Robert Lucas is a/an 81 y.o. male with a past medical history of chronic systolic and diastolic HF (EF 41-66%, A6TK), CAD s/p PTCA, HTN, T2DM, CKD V who presents with [facility] and volume overload. ? ?Evaluated patient at bedside. He was drowsy during encounter but easily aroused. States that he has not been feeling too well over the past few weeks. Has been noticing increasing shortness of breath especially with any activity and has noticed more swelling predominantly in his legs. States that he knows that his kidneys are not working well and he has a fistula that was placed but he was told that it may not be ready yet to use for dialysis. Denies any fevers, chills, N/V, chest pain. Does endorse a cough that is productive of some clear to light yellow sputum. Also notes that he is feeling more fatigued. Otherwise, states that he is still urinating several times a day and no recent changes in urination that he has noticed.  ? ?Nephrology was consulted due to advanced kidney disease and to assess need for  dialysis. He was seen by Dr. Joelyn Lucas once at [facility] but he receives comprehensive medical care at [facility] and follows  nephrology there.  ? ?Today, states that he feels okay. Not much change since yesterday. Does feel tired and sleepy right now. States that he is supposed to get a procedure done today to let him undergo dialysis. No complaints or concerns at this time. ? ?Medications:  ?Current Facility-Administered Medications  ?Medication Dose Route Frequency Provider Last Rate Last Admin  ? acetaminophen (TYLENOL) tablet 650 mg  650 mg Oral Q6H PRN Rise Patience, MD      ? Or  ? acetaminophen (TYLENOL) suppository 650 mg  650 mg Rectal Q6H PRN Rise Patience, MD      ? allopurinol (ZYLOPRIM) tablet 100 mg  100 mg Oral Daily Rise Patience, MD   100 mg at 10/14/21 1049  ? aspirin EC tablet 81 mg  81 mg Oral QPM Rise Patience, MD      ? atorvastatin (LIPITOR) tablet 40 mg  40 mg Oral QHS Rise Patience, MD   40 mg at 10/13/21 2347  ? azithromycin (ZITHROMAX) 500 mg in sodium chloride 0.9 % 250 mL IVPB  500 mg Intravenous Q24H Rise Patience, MD   Stopped at 10/14/21 0230  ? calcitRIOL (ROCALTROL) capsule 0.25 mcg  0.25 mcg Oral Q M,W,F Rise Patience, MD   0.25 mcg at 10/14/21 1049  ? cefTRIAXone (ROCEPHIN) 2 g in sodium chloride 0.9 % 100 mL IVPB  2 g Intravenous Q24H Rise Patience, MD   Stopped at 10/14/21 0053  ? finasteride (PROSCAR) tablet 5 mg  5 mg Oral QPM Rise Patience, MD      ? Derrill Memo ON 10/15/2021] heparin injection 5,000 Units  5,000 Units Subcutaneous Q8H Reesa Chew, MD      ? hydrALAZINE (APRESOLINE) injection 10 mg  10 mg Intravenous Q4H PRN Rise Patience, MD      ? insulin aspart (novoLOG) injection 0-6 Units  0-6 Units Subcutaneous TID WC Rise Patience, MD   1 Units at 10/14/21 0847  ? sodium bicarbonate tablet 650 mg  650 mg Oral BID Rise Patience, MD   650 mg at 10/14/21 1049  ? tamsulosin (FLOMAX) capsule 0.4 mg  0.4 mg Oral QHS Rise Patience, MD   0.4 mg at 10/13/21 2344  ?  ? ?ALLERGIES ?Lisinopril, Hydrocodone,  Liraglutide, Prednisone, Sitagliptin, Vardenafil, and Penicillins ? ?MEDICAL HISTORY ?Past Medical History:  ?Diagnosis Date  ? Arthritis   ? Bell's palsy   ? one episode  ? CKD (chronic kidney disease)   ? sees [place] in [place]  ? Diabetes ([place])   ? type 2   sees endo at [place] in [place]  ? Hypercholesteremia   ? Hypertension   ? Myocardial infarction [facility]) 1990  ? Neuromuscular disorder ([place])   ? PONV (postoperative nausea and vomiting)   ?  ? ?SOCIAL HISTORY ?Social History  ? ?Socioeconomic History  ? Marital status: Married  ?  Spouse name: Not on file  ? Number of children: 3  ? Years of education: Not on file  ? Highest education level: Not on file  ?Occupational History  ? Not on file  ?Tobacco Use  ? Smoking status: Former  ?  Types: Cigars  ?  Quit date: 07/03/1988  ?  Years since quitting: 33.3  ? Smokeless tobacco: Never  ?Vaping Use  ? Vaping Use: Never used  ?  Substance and Sexual Activity  ? Alcohol use: Not Currently  ?  Comment: occasional beer  ? Drug use: No  ? Sexual activity: Not Currently  ?Other Topics Concern  ? Not on file  ?Social History Narrative  ? Not on file  ? ?Social Determinants of Health  ? ?Financial Resource Strain: Not on file  ?Food Insecurity: Not on file  ?Transportation Needs: Not on file  ?Physical Activity: Not on file  ?Stress: Not on file  ?Social Connections: Not on file  ?Intimate Partner Violence: Not on file  ?  ? ?FAMILY HISTORY ?Family History  ?Problem Relation Age of Onset  ? Dementia Mother   ? Stroke Brother   ?  ? ? ?Review of Systems: ?12 systems reviewed ?Otherwise as per HPI, all other systems reviewed and negative ? ?Physical Exam: ?Vitals:  ? 10/14/21 0535 10/14/21 0805  ?BP:  (!) 111/50  ?Pulse:  66  ?Resp:  18  ?Temp: (!) 95.4 ?F (35.2 ?C) (!) 97.5 ?F (36.4 ?C)  ?SpO2:  95%  ? ?No intake/output data recorded. ? ?Intake/Output Summary (Last 24 hours) at 10/14/2021 1104 ?Last data filed at 10/14/2021 0700 ?Gross per 24 hour  ?Intake 350.16 ml  ?Output 1 ml   ?Net 349.16 ml  ? ?General: elderly male, laying in bed, NAD. ?HEENT: tongue fasciculations present ?CV: normal rate, regular rhythm, no murmurs, no gallops, no rubs. ?Lungs: clear to auscultation bilaterally,

## 2021-10-14 NOTE — Progress Notes (Signed)
Patient's BP 90/45 (MAP 60) HR 60 Temp 95.2 rectally O2 sat 97 on RA. Patient denies any c/o. Kakrakandy,MD made aware via secure chat. Orders received. Will continue to monitor. ?

## 2021-10-14 NOTE — Progress Notes (Signed)
Interventional Radiology Brief Note: ? ?Patient with history of progressive renal failure s/p AVF creation with VVS in 05/2021.  AVF with slow maturation, per VVS might not be useable.  ? ?Patient undergoing duplex assessment this afternoon.  ? ?Patient stable.  ?Allow patient to eat today.  ?IR will plan for HD catheter placement tomorrow (4/15) as able.  Will monitor for results of Vascular US in interim.  ? ?Brynda Greathouse, MS RD PA-C ? ? ?

## 2021-10-14 NOTE — H&P (Signed)
Chief Complaint: Patient was seen in consultation today for tunneled hemodialysis catheter placement at the request of Louie Bun, MD Referring Physician(s): Louie Bun, MD  Supervising Physician: Pernell Dupre  Patient Status: [facility] - In-pt  History of Present Illness: Robert Lucas is a 81 y.o. male w/ PMH of CKD, CHF, DM II, HTN and MI. Pt presented to [facility] ED c/o SOB. He was admitted for acute on chronic kidney disease stage V with fluid overload. Pt has AVF in place but unsure if it is functional. Pt was referred by Dr. Valentino Nose to have tunneled dialysis catheter placed so he may start inpatient dialysis.  Past Medical History:  Diagnosis Date   Arthritis    Bell's palsy    one episode   CKD (chronic kidney disease)    [facility] [place]   Diabetes [facility])    type 2   sees endo at [place] in [place]   Hypercholesteremia    Hypertension    Myocardial infarction [facility]) 1990   Neuromuscular disorder ([facility])    PONV (postoperative nausea and vomiting)     Past Surgical History:  Procedure Laterality Date   AV FISTULA PLACEMENT Left 05/17/2021   Procedure: LEFT ARM ARTERIOVENOUS (AV) FISTULA CREATION;  Surgeon: Victorino Sparrow, MD;  [facility] OR;  Service: Vascular;  Laterality: Left;  PERIPHERAL NERVE BLOCK   BACK SURGERY  yrs ago   lower   CARDIAC CATHETERIZATION     CATARACT EXTRACTION Right 01/2021   CHOLECYSTECTOMY N/A 05/02/2017   Procedure: LAPAROSCOPIC CHOLECYSTECTOMY;  Surgeon: Axel Filler, MD;  Location: [facility] OR;  Service: General;  Laterality: N/A;   CORONARY ANGIOPLASTY     2 1990   EYE SURGERY Left 03/2021   cataract removal   KNEE SURGERY Left yrs ago   arthroscopic   ROTATOR CUFF REPAIR Left yrs ago   SHOULDER OPEN ROTATOR CUFF REPAIR Right 04/23/2013   Procedure: RIGHT SHOULDER ROTATOR CUFF REPAIR WITH GRAFT AND ANCHORS ;  Surgeon: Jacki Cones, MD;  Location: [facility] ORS;  Service: Orthopedics;  Laterality: Right;   TONSILLECTOMY      TOTAL HIP ARTHROPLASTY Right 05/06/2019   Procedure: TOTAL HIP ARTHROPLASTY ANTERIOR APPROACH;  Surgeon: Durene Romans, MD;  Location: [facility] ORS;  Service: Orthopedics;  Laterality: Right;  70 mins    Allergies: Lisinopril, Hydrocodone, Liraglutide, Prednisone, Sitagliptin, Vardenafil, and Penicillins  Medications: Prior to Admission medications   Medication Sig Start Date End Date Taking? Authorizing Provider  acetaminophen (TYLENOL) 500 MG tablet Take 500 mg by mouth every 6 (six) hours as needed for moderate pain or headache.    [provider]  allopurinol (ZYLOPRIM) 100 MG tablet Take 100 mg by mouth daily.    [provider]  amLODipine (NORVASC) 5 MG tablet Take 1 tablet (5 mg total) by mouth daily. 09/21/21   Patwardhan, Anabel Bene, MD  Aromatic Inhalants (VICKS VAPOINHALER IN) Inhale 1 puff into the lungs daily as needed (congestion).    [provider]  aspirin EC 81 MG tablet Take 81 mg by mouth every evening.    [provider]  atorvastatin (LIPITOR) 40 MG tablet Take 40 mg by mouth at bedtime.    [provider]  calcitRIOL (ROCALTROL) 0.25 MCG capsule Take 0.25 mcg by mouth every Monday, Wednesday, and Friday.    [provider]  epoetin alfa-epbx (RETACRIT) 2000 UNIT/ML injection 2,000 Units every 30 (thirty) days.    [provider]  finasteride (PROSCAR) 5 MG  tablet Take 5 mg by mouth every evening.    [provider]  fluticasone (VERAMYST) 27.5 MCG/SPRAY nasal spray Place 2 sprays into the nose daily as needed for rhinitis.    [provider]  furosemide (LASIX) 80 MG tablet Take 1 tablet (80 mg total) by mouth 2 (two) times daily. 09/21/21 10/21/21  Patwardhan, Anabel Bene, MD  hydrALAZINE (APRESOLINE) 50 MG tablet Take 1 tablet (50 mg total) by mouth in the morning and at bedtime. 04/20/21   Patwardhan, Anabel Bene, MD  hydrOXYzine (ATARAX) 10 MG tablet Take 10 mg by mouth as needed.    [provider]  insulin aspart protamine - aspart (NOVOLOG MIX 70/30 FLEXPEN) (70-30) 100 UNIT/ML FlexPen Inject 14-20 Units into the skin daily as needed (high blood sugar). If BS is <180=0 units, If BS is 180-200 units=14 units, If BS is >201=20 units    [provider]  isosorbide dinitrate (ISORDIL) 20 MG tablet Take 1 tablet (20 mg total) by mouth 2 (two) times daily. 03/23/21   Patwardhan, Anabel Bene, MD  metolazone (ZAROXOLYN) 5 MG tablet Take 5 mg by mouth 2 (two) times a week. 10/10/21   [provider]  metoprolol succinate (TOPROL-XL) 50 MG 24 hr tablet Take 1 tablet (50 mg total) by mouth daily. Take with or immediately following a meal. 07/11/20   Albertine Grates, MD  sodium bicarbonate 650 MG tablet Take 650 mg by mouth 2 (two) times daily.    [provider]  tamsulosin (FLOMAX) 0.4 MG CAPS capsule Take 1 capsule (0.4 mg total) by mouth daily after supper. Patient taking differently: Take 0.4 mg by mouth at bedtime. 07/11/20   Albertine Grates, MD     Family History  Problem Relation Age of Onset   Dementia Mother    Stroke Brother     Social History   Socioeconomic History   Marital status: Married    Spouse name: Not on file   Number of children: 3   Years of education: Not on file   Highest education level: Not on file  Occupational History   Not on file  Tobacco Use   Smoking status: Former    Types: Cigars    Quit date: 07/03/1988    Years since quitting: 33.3   Smokeless tobacco: Never  Vaping Use   Vaping Use: Never used  Substance and Sexual Activity   Alcohol use: Not Currently    Comment: occasional beer   Drug use: No   Sexual activity: Not Currently  Other Topics Concern   Not on file  Social History Narrative   Not on file   Social Determinants of Health   Financial Resource Strain: Not on file  Food Insecurity: Not on file  Transportation Needs: Not on file  Physical Activity: Not on file  Stress: Not on file  Social Connections: Not  on file    Review of Systems: A 12 point ROS discussed and pertinent positives are indicated in the HPI above.  All other systems are negative.  Review of Systems  Constitutional:  Negative for chills and fever.  Respiratory:  Positive for shortness of breath. Negative for cough.   Cardiovascular:  Negative for chest pain and leg swelling.  Gastrointestinal:  Negative for abdominal pain, nausea and vomiting.  Neurological:  Negative for dizziness and headaches.   Vital Signs: BP (!) 111/50 (BP Location: Left Arm)   Pulse 66   Temp (!) 97.5 F (36.4 C) (Oral)  Resp 18   Ht 5\' 3"  (1.6 m)   Wt 176 lb (79.8 kg)   SpO2 95%   BMI 31.18 kg/m   Physical Exam Constitutional:      General: He is not in acute distress.    Appearance: He is ill-appearing.  HENT:     Head: Normocephalic and atraumatic.  Eyes:     Extraocular Movements: Extraocular movements intact.     Pupils: Pupils are equal, round, and reactive to light.  Cardiovascular:     Rate and Rhythm: Normal rate and regular rhythm.     Pulses: Normal pulses.  Pulmonary:     Effort: Pulmonary effort is normal. No respiratory distress.     Breath sounds: Normal breath sounds.  Abdominal:     General: Bowel sounds are normal. There is no distension.     Palpations: Abdomen is soft.     Tenderness: There is no abdominal tenderness. There is no guarding.  Musculoskeletal:     Right lower leg: No edema.     Left lower leg: No edema.  Skin:    General: Skin is warm and dry.     Comments: AVF to LUE thrill/bruit noted   Neurological:     Mental Status: He is alert and oriented to person, place, and time.  Psychiatric:        Mood and Affect: Mood normal.        Behavior: Behavior normal.        Thought Content: Thought content normal.        Judgment: Judgment normal.    Imaging: DG Chest Portable 1 View  Result Date: 10/13/2021 CLINICAL DATA:  Short of breath EXAM: PORTABLE CHEST 1 VIEW COMPARISON:  08/25/2021  FINDINGS: Cardiac enlargement with mild vascular congestion. Mild bibasilar atelectasis or infiltrate has progressed. Small bilateral effusions are slightly improved. IMPRESSION: Mild fluid overload with cardiac enlargement, vascular congestion and small effusion Mild bibasilar atelectasis/infiltrate. Electronically Signed   By: Marlan Palau M.D.   On: 10/13/2021 16:07    Labs:  CBC: Recent Labs    08/26/21 0050 08/27/21 0109 09/07/21 1000 10/05/21 1046 10/13/21 1555 10/14/21 0257  WBC 5.6 5.0  --   --  5.4 4.8  HGB 8.4* 8.1* 9.5* 7.9* 8.8* 8.3*  HCT 25.8* 24.3*  --   --  27.0* 25.7*  PLT 161 156  --   --  138* 103*    COAGS: No results for input(s): INR, APTT in the last 8760 hours.  BMP: Recent Labs    09/07/21 0959 10/05/21 1039 10/13/21 1555 10/14/21 0257  NA 135 140 132* 132*  K 4.2 3.9 4.3 4.3  CL 99 108 100 104  CO2 24 20* 16* 15*  GLUCOSE 308* 335* 244* 183*  BUN 78* 98* 118* 118*  CALCIUM 8.5* 7.8* 7.8* 7.3*  CREATININE 4.87* 6.08* 6.95* 7.17*  GFRNONAA 11* 9* 7* 7*    LIVER FUNCTION TESTS: Recent Labs    08/26/21 0050 08/27/21 0109 09/07/21 0959 10/05/21 1039 10/13/21 1555 10/14/21 0257  BILITOT 1.3*  --   --   --  0.7 0.8  AST 22  --   --   --  16 15  ALT 21  --   --   --  17 18  ALKPHOS 74  --   --   --  75 71  PROT 6.1*  --   --   --  6.9 6.2*  ALBUMIN 3.1*   < > 3.2* 3.0*  4.0 3.0*   < > = values in this interval not displayed.    TUMOR MARKERS: No results for input(s): AFPTM, CEA, CA199, CHROMGRNA in the last 8760 hours.  Assessment and Plan: History of CKD, CHF, DM II, HTN and MI. Pt presented to [facility] ED c/o SOB. He was admitted for acute on chronic kidney disease stage V with fluid overload. Pt has AVF in place but unsure if it is functional. Pt was referred by Dr. Valentino Nose to have tunneled dialysis catheter placed so he may start inpatient dialysis.   Pt resting in bed.  He is A&O, calm and pleasant. He has slightly increased work of  breathing and has difficulty speaking in complete sentences, but is in no distress. Pt states that they have never used his AVF, that it is "too small". Thrill and bruit noted on exam.  Pt states he is NPO per order.   Risks and benefits of tunneled dialysis catheter with moderate sedation discussed with the patient including, but not limited to bleeding, infection, vascular injury, pneumothorax which may require chest tube placement, air embolism or even death  All of the patient's questions were answered, patient is agreeable to proceed. Consent signed and in chart.   Thank you for this interesting consult.  I greatly enjoyed meeting Robert Lucas and look forward to participating in their care.  A copy of this report was sent to the requesting provider on this date.  Electronically Signed: Shon Hough, NP 10/14/2021, 9:54 AM   I spent a total of 20 minutes in face to face in clinical consultation, greater than 50% of which was counseling/coordinating care for tunneled dialysis catheter.

## 2021-10-14 NOTE — Consult Note (Addendum)
?Hospital Consult ? ?VASCULAR SURGERY ASSESSMENT & PLAN:  ? ?POORLY MATURING LEFT UPPER ARM FISTULA: This patient had a left brachiocephalic fistula placed in November 2022.  The patient was last seen in our office in February and at that time the vein had not increased in size adequately.  On exam the fistula still seems small in the upper arm although it has a good thrill and is larger just above the antecubital level.  We have ordered a follow-up duplex scan.  A dialysis catheter is being placed by interventional radiology.  We will likely arrange for a fistulogram next week to further evaluate his fistula.  In the meantime he can begin dialysis after his catheter is placed.  Vascular surgery will follow. ? ?Gae Gallop, MD ?3:05 PM ? ? ?Reason for Consult:  dialysis access ?Requesting Physician:  Hal Hope ?MRN #:  [id number] ? ?History of Present Illness: This is a 81 y.o. male with AKI on CKD V admitted with fluid overload now in need of dialysis.  He has hx of left BC AVF on 05/17/2021 by Dr. Virl Cagey.  He was seen back in our office in February and at that time, he was not yet on HD.  His duplex had increased in diameter but was still not maturing adequately.  He did have some elevated velocities but since he was not yet on HD this was not scheduled.   ? ?He was admitted yesterday with increasing shortness of breath and productive cough.  He was found to have hyponatremia, acidosis, worsening kidney function, elevated BNP and felt that HD needs to be initiated.  VVS is consulted to check on his fistula maturation. ? ?He denies any pain in his left hand.   ? ? ? ?Past Medical History:  ?Diagnosis Date  ? Arthritis   ? Bell's palsy   ? one episode  ? CKD (chronic kidney disease)   ? sees [place] in [place]  ? Diabetes ([place])   ? type 2   sees endo at [place] in [place]  ? Hypercholesteremia   ? Hypertension   ? Myocardial infarction [facility]) 1990  ? Neuromuscular disorder ([place])   ? PONV (postoperative nausea and  vomiting)   ? ? ?Past Surgical History:  ?Procedure Laterality Date  ? AV FISTULA PLACEMENT Left 05/17/2021  ? Procedure: LEFT ARM ARTERIOVENOUS (AV) FISTULA CREATION;  Surgeon: Broadus John, MD;  Location: [facility] OR;  Service: Vascular;  Laterality: Left;  PERIPHERAL NERVE BLOCK  ? BACK SURGERY  yrs ago  ? lower  ? CARDIAC CATHETERIZATION    ? CATARACT EXTRACTION Right 01/2021  ? CHOLECYSTECTOMY N/A 05/02/2017  ? Procedure: LAPAROSCOPIC CHOLECYSTECTOMY;  Surgeon: Ralene Ok, MD;  Location: [place];  Service: General;  Laterality: N/A;  ? CORONARY ANGIOPLASTY    ? 2 1990  ? EYE SURGERY Left 03/2021  ? cataract removal  ? KNEE SURGERY Left yrs ago  ? arthroscopic  ? ROTATOR CUFF REPAIR Left yrs ago  ? SHOULDER OPEN ROTATOR CUFF REPAIR Right 04/23/2013  ? Procedure: RIGHT SHOULDER ROTATOR CUFF REPAIR WITH GRAFT AND ANCHORS ;  Surgeon: Tobi Bastos, MD;  Location: [facility] OR[facility];  Service: Orthopedics;  Laterality: Right;  ? TONSILLECTOMY    ? TOTAL HIP ARTHROPLASTY Right 05/06/2019  ? Procedure: TOTAL HIP ARTHROPLASTY ANTERIOR APPROACH;  Surgeon: Paralee Cancel, MD;  Location: [facility] OR[facility];  Service: Orthopedics;  Laterality: Right;  70 mins  ? ? ?Allergies  ?Allergen Reactions  ? Lisinopril Cough  ? Hydrocodone Nausea Only  ?  Liraglutide   ?  GI upset  ? Prednisone   ?  Elevates BGL; patient prefers to not take this  ? Sitagliptin   ?  Unknown  ? Vardenafil   ?  Other reaction(s): flushing  ? Penicillins Rash and Other (See Comments)  ?  Rash around ankles ?Has patient had a PCN reaction causing immediate rash, facial/tongue/throat swelling, SOB or lightheadedness with hypotension: Yes ?Has patient had a PCN reaction causing severe rash involving mucus membranes or skin necrosis: Unk ?Has patient had a PCN reaction that required hospitalization: No ?Has patient had a PCN reaction occurring within the last 10 years: No ?If all of the above answers are "NO", then may proceed with Cephalosporin use. ?  ? ? ?Prior to Admission  medications   ?Medication Sig Start Date End Date Taking? Authorizing Provider  ?acetaminophen (TYLENOL) 500 MG tablet Take 500 mg by mouth every 6 (six) hours as needed for moderate pain or headache.    [provider]  ?allopurinol (ZYLOPRIM) 100 MG tablet Take 100 mg by mouth daily.    [provider]  ?amLODipine (NORVASC) 5 MG tablet Take 1 tablet (5 mg total) by mouth daily. 09/21/21   Patwardhan, Reynold Bowen, MD  ?Aromatic Inhalants (VICKS VAPOINHALER IN) Inhale 1 puff into the lungs daily as needed (congestion).    [provider]  ?aspirin EC 81 MG tablet Take 81 mg by mouth every evening.    [provider]  ?atorvastatin (LIPITOR) 40 MG tablet Take 40 mg by mouth at bedtime.    [provider]  ?calcitRIOL (ROCALTROL) 0.25 MCG capsule Take 0.25 mcg by mouth every Monday, Wednesday, and Friday.    [provider]  ?epoetin alfa-epbx (RETACRIT) 2000 UNIT/ML injection 2,000 Units every 30 (thirty) days.    [provider]  ?finasteride (PROSCAR) 5 MG tablet Take 5 mg by mouth every evening.    [provider]  ?fluticasone (VERAMYST) 27.5 MCG/SPRAY nasal spray Place 2 sprays into the nose daily as needed for rhinitis.    [provider]  ?furosemide (LASIX) 80 MG tablet Take 1 tablet (80 mg total) by mouth 2 (two) times daily. 09/21/21 10/21/21  Patwardhan, Reynold Bowen, MD  ?hydrALAZINE (APRESOLINE) 50 MG tablet Take 1 tablet (50 mg total) by mouth in the morning and at bedtime. 04/20/21   Patwardhan, Reynold Bowen, MD  ?hydrOXYzine (ATARAX) 10 MG tablet Take 10 mg by mouth as needed.    [provider]  ?insulin aspart protamine - aspart (NOVOLOG MIX 70/30 FLEXPEN) (70-30) 100 UNIT/ML FlexPen Inject 14-20 Units into the skin daily as needed (high blood sugar). If BS is <180=0 units, If BS is 180-200 units=14 units, If BS is >201=20 units    [provider]  ?isosorbide dinitrate (ISORDIL) 20 MG tablet Take 1 tablet (20 mg  total) by mouth 2 (two) times daily. 03/23/21   Patwardhan, Reynold Bowen, MD  ?metolazone (ZAROXOLYN) 5 MG tablet Take 5 mg by mouth 2 (two) times a week. 10/10/21   [provider]  ?metoprolol succinate (TOPROL-XL) 50 MG 24 hr tablet Take 1 tablet (50 mg total) by mouth daily. Take with or immediately following a meal. 07/11/20   Florencia Reasons, MD  ?sodium bicarbonate 650 MG tablet Take 650 mg by mouth 2 (two) times daily.    [provider]  ?tamsulosin (FLOMAX) 0.4 MG CAPS capsule Take 1 capsule (0.4 mg total) by mouth daily after supper. ?Patient taking differently: Take 0.4 mg  by mouth at bedtime. 07/11/20   Florencia Reasons, MD  ? ? ?Social History  ? ?Socioeconomic History  ? Marital status: Married  ?  Spouse name: Not on file  ? Number of children: 3  ? Years of education: Not on file  ? Highest education level: Not on file  ?Occupational History  ? Not on file  ?Tobacco Use  ? Smoking status: Former  ?  Types: Cigars  ?  Quit date: 07/03/1988  ?  Years since quitting: 33.3  ? Smokeless tobacco: Never  ?Vaping Use  ? Vaping Use: Never used  ?Substance and Sexual Activity  ? Alcohol use: Not Currently  ?  Comment: occasional beer  ? Drug use: No  ? Sexual activity: Not Currently  ?Other Topics Concern  ? Not on file  ?Social History Narrative  ? Not on file  ? ?Social Determinants of Health  ? ?Financial Resource Strain: Not on file  ?Food Insecurity: Not on file  ?Transportation Needs: Not on file  ?Physical Activity: Not on file  ?Stress: Not on file  ?Social Connections: Not on file  ?Intimate Partner Violence: Not on file  ? ? ? ?Family History  ?Problem Relation Age of Onset  ? Dementia Mother   ? Stroke Brother   ? ? ?ROS: [x]  Positive   [ ]  Negative   [ ]  All sytems reviewed and are negative ? ?Cardiac: ?[]  chest pain/pressure ?[x]  dyspnea on exertion ? ?Vascular: ?[x]  swelling in legs ? ?Pulmonary: ?[]  asthma/wheezing ?[]  home O2 ? ?Neurologic: ?[]  hx of CVA []  mini stroke ? ? ?Hematologic: ?[]  hx of  cancer ? ?Endocrine:  ? [x]  diabetes []  thyroid disease ? ?GI ?[]  GERD ? ?GU: ?[x]  CKD/renal failure []  HD--[]  M/W/F or []  T/T/S ? ?Psychiatric: ?[]  anxiety ?[]  depression ? ?Musculoskeletal: ?[]  arthritis

## 2021-10-14 NOTE — Progress Notes (Signed)
?   10/13/21 2118  ?Assess: MEWS Score  ?Temp (!) 94.2 ?F (34.6 ?C)  ?BP 133/63  ?Pulse Rate (!) 49  ?Resp 19  ?Level of Consciousness Alert  ?SpO2 94 %  ?O2 Device Room Air  ?Patient Activity (if Appropriate) In bed  ?Assess: MEWS Score  ?MEWS Temp 2  ?MEWS Systolic 0  ?MEWS Pulse 1  ?MEWS RR 0  ?MEWS LOC 0  ?MEWS Score 3  ?MEWS Score Color Yellow  ?Assess: if the MEWS score is Yellow or Red  ?Were vital signs taken at a resting state? Yes  ?Focused Assessment Change from prior assessment (see assessment flowsheet)  ?Early Detection of Sepsis Score *See Row Information* High  ?MEWS guidelines implemented *See Row Information* Yes  ?Treat  ?Pain Score 0  ?Take Vital Signs  ?Increase Vital Sign Frequency  Yellow: Q 2hr X 2 then Q 4hr X 2, if remains yellow, continue Q 4hrs  ?Escalate  ?MEWS: Escalate Yellow: discuss with charge nurse/RN and consider discussing with provider and RRT  ?Notify: Charge Nurse/RN  ?Name of Charge Nurse/RN Notified Nicolas Sisler,RN ?(self)  ?Date Charge Nurse/RN Notified 10/13/21  ?Time Charge Nurse/RN Notified 2118  ?Notify: Provider  ?Provider Name/Title Kakrakandy,MD  ?Date Provider Notified 10/13/21  ?Time Provider Notified 2200  ?Notification Type Page ?(secure chat)  ?Notification Reason Other (Comment) ?(rectal temp 94.2,yellow MEWS)  ?Provider response See new orders  ?Date of Provider Response 10/13/21  ?Time of Provider Response 2200  ? ? ?

## 2021-10-14 NOTE — Progress Notes (Signed)
Upper extremity duplex dialysis study completed.  ? ?Please see CV Proc for preliminary results.  ? ?Darlin Coco, RDMS, RVT ? ?

## 2021-10-14 NOTE — Progress Notes (Signed)
Pt refused to wear CPAP tonight. States he doesn't feel like he needs it but will call if he does. ?

## 2021-10-15 ENCOUNTER — Inpatient Hospital Stay (HOSPITAL_COMMUNITY): Payer: Medicare Other

## 2021-10-15 DIAGNOSIS — E8770 Fluid overload, unspecified: Secondary | ICD-10-CM | POA: Diagnosis not present

## 2021-10-15 DIAGNOSIS — N179 Acute kidney failure, unspecified: Secondary | ICD-10-CM | POA: Diagnosis not present

## 2021-10-15 DIAGNOSIS — I10 Essential (primary) hypertension: Secondary | ICD-10-CM | POA: Diagnosis not present

## 2021-10-15 DIAGNOSIS — I5023 Acute on chronic systolic (congestive) heart failure: Secondary | ICD-10-CM | POA: Diagnosis not present

## 2021-10-15 HISTORY — PX: IR US GUIDE VASC ACCESS RIGHT: IMG2390

## 2021-10-15 HISTORY — PX: IR FLUORO GUIDE CV LINE RIGHT: IMG2283

## 2021-10-15 HISTORY — PX: IR PERC TUN PERIT CATH WO PORT S&I /IMAG: IMG2327

## 2021-10-15 LAB — LIPID PANEL
Cholesterol: 108 mg/dL (ref 0–200)
HDL: 31 mg/dL — ABNORMAL LOW (ref >40)
LDL Cholesterol: 46 mg/dL (ref 0–99)
Total CHOL/HDL Ratio: 3.5 RATIO
Triglycerides: 154 mg/dL — ABNORMAL HIGH (ref <150)
VLDL: 31 mg/dL (ref 0–40)

## 2021-10-15 LAB — GASTROINTESTINAL PANEL BY PCR, STOOL (REPLACES STOOL CULTURE)

## 2021-10-15 LAB — GLUCOSE, CAPILLARY
Glucose-Capillary: 167 mg/dL — ABNORMAL HIGH (ref 70–99)
Glucose-Capillary: 135 mg/dL — ABNORMAL HIGH (ref 70–99)
Glucose-Capillary: 194 mg/dL — ABNORMAL HIGH (ref 70–99)

## 2021-10-15 LAB — COMPREHENSIVE METABOLIC PANEL
ALT: 18 U/L (ref 0–44)
AST: 16 U/L (ref 15–41)
Albumin: 3 g/dL — ABNORMAL LOW (ref 3.5–5.0)
Alkaline Phosphatase: 69 U/L (ref 38–126)
Anion gap: 16 — ABNORMAL HIGH (ref 5–15)
BUN: 123 mg/dL — ABNORMAL HIGH (ref 8–23)
CO2: 13 mmol/L — ABNORMAL LOW (ref 22–32)
Calcium: 7.3 mg/dL — ABNORMAL LOW (ref 8.9–10.3)
Chloride: 107 mmol/L (ref 98–111)
Creatinine, Ser: 7.51 mg/dL — ABNORMAL HIGH (ref 0.61–1.24)
GFR, Estimated: 7 mL/min — ABNORMAL LOW (ref >60)
Glucose, Bld: 202 mg/dL — ABNORMAL HIGH (ref 70–99)
Potassium: 4.2 mmol/L (ref 3.5–5.1)
Sodium: 136 mmol/L (ref 135–145)
Total Bilirubin: 0.5 mg/dL (ref 0.3–1.2)
Total Protein: 6 g/dL — ABNORMAL LOW (ref 6.5–8.1)

## 2021-10-15 LAB — CBC WITH DIFFERENTIAL/PLATELET
Abs Immature Granulocytes: 0.03 10*3/uL (ref 0.00–0.07)
Basophils Absolute: 0 10*3/uL (ref 0.0–0.1)
Basophils Relative: 0 %
Eosinophils Absolute: 0.1 10*3/uL (ref 0.0–0.5)
Eosinophils Relative: 1 %
HCT: 26 % — ABNORMAL LOW (ref 39.0–52.0)
Hemoglobin: 8.4 g/dL — ABNORMAL LOW (ref 13.0–17.0)
Immature Granulocytes: 1 %
Lymphocytes Relative: 10 %
Lymphs Abs: 0.5 10*3/uL — ABNORMAL LOW (ref 0.7–4.0)
MCH: 31.7 pg (ref 26.0–34.0)
MCHC: 32.3 g/dL (ref 30.0–36.0)
MCV: 98.1 fL (ref 80.0–100.0)
Monocytes Absolute: 0.6 10*3/uL (ref 0.1–1.0)
Monocytes Relative: 11 %
Neutro Abs: 4 10*3/uL (ref 1.7–7.7)
Neutrophils Relative %: 77 %
Platelets: 96 10*3/uL — ABNORMAL LOW (ref 150–400)
RBC: 2.65 MIL/uL — ABNORMAL LOW (ref 4.22–5.81)
RDW: 17.2 % — ABNORMAL HIGH (ref 11.5–15.5)
WBC: 5.2 10*3/uL (ref 4.0–10.5)
nRBC: 0.4 % — ABNORMAL HIGH (ref 0.0–0.2)

## 2021-10-15 LAB — IRON AND TIBC
Iron: 39 ug/dL — ABNORMAL LOW (ref 45–182)
Saturation Ratios: 11 % — ABNORMAL LOW (ref 17.9–39.5)
TIBC: 343 ug/dL (ref 250–450)
UIBC: 304 ug/dL

## 2021-10-15 LAB — C DIFFICILE QUICK SCREEN W PCR REFLEX
C Diff antigen: NEGATIVE
C Diff interpretation: NOT DETECTED
C Diff toxin: NEGATIVE

## 2021-10-15 LAB — HEMOGLOBIN A1C
Hgb A1c MFr Bld: 7.4 % — ABNORMAL HIGH (ref 4.8–5.6)
Mean Plasma Glucose: 165.68 mg/dL

## 2021-10-15 LAB — PHOSPHORUS: Phosphorus: 10.9 mg/dL — ABNORMAL HIGH (ref 2.5–4.6)

## 2021-10-15 LAB — RETICULOCYTES
Immature Retic Fract: 33.5 % — ABNORMAL HIGH (ref 2.3–15.9)
RBC.: 2.65 MIL/uL — ABNORMAL LOW (ref 4.22–5.81)
Retic Count, Absolute: 125.6 10*3/uL (ref 19.0–186.0)
Retic Ct Pct: 4.7 % — ABNORMAL HIGH (ref 0.4–3.1)

## 2021-10-15 LAB — HEPATITIS B SURFACE ANTIBODY, QUANTITATIVE: Hep B S AB Quant (Post): <3.1 m[IU]/mL — ABNORMAL LOW (ref >9.9)

## 2021-10-15 LAB — MAGNESIUM: Magnesium: 1.7 mg/dL (ref 1.7–2.4)

## 2021-10-15 LAB — FERRITIN: Ferritin: 64 ng/mL (ref 24–336)

## 2021-10-15 LAB — URIC ACID: Uric Acid, Serum: 4.3 mg/dL (ref 3.7–8.6)

## 2021-10-15 LAB — VITAMIN B12: Vitamin B-12: 740 pg/mL (ref 180–914)

## 2021-10-15 LAB — FOLATE: Folate: 13.8 ng/mL (ref >5.9)

## 2021-10-15 MED ORDER — HEPARIN SODIUM (PORCINE) 1000 UNIT/ML IJ SOLN
INTRAMUSCULAR | Status: AC
  Filled 2021-10-15: qty 10

## 2021-10-15 MED ORDER — MIDAZOLAM HCL 2 MG/2ML IJ SOLN
INTRAMUSCULAR | Status: AC
  Filled 2021-10-15: qty 2

## 2021-10-15 MED ORDER — NALOXONE HCL 0.4 MG/ML IJ SOLN
INTRAMUSCULAR | Status: AC
  Filled 2021-10-15: qty 1

## 2021-10-15 MED ORDER — OXYCODONE-ACETAMINOPHEN 5-325 MG PO TABS
1.0000 | ORAL_TABLET | Freq: Four times a day (QID) | ORAL | Status: DC | PRN
  Administered 2021-10-15: 1 via ORAL
  Filled 2021-10-15: qty 2

## 2021-10-15 MED ORDER — VANCOMYCIN HCL IN DEXTROSE 1-5 GM/200ML-% IV SOLN
INTRAVENOUS | Status: AC
Start: 2021-10-15 — End: 2021-10-15
  Filled 2021-10-15: qty 200

## 2021-10-15 MED ORDER — FENTANYL CITRATE (PF) 100 MCG/2ML IJ SOLN
INTRAMUSCULAR | Status: AC
  Filled 2021-10-15: qty 2

## 2021-10-15 MED ORDER — LIDOCAINE HCL 1 % IJ SOLN
INTRAMUSCULAR | Status: AC
  Filled 2021-10-15: qty 20

## 2021-10-15 MED ORDER — FLUMAZENIL 0.5 MG/5ML IV SOLN
INTRAVENOUS | Status: AC
  Filled 2021-10-15: qty 5

## 2021-10-15 MED ORDER — GELATIN ABSORBABLE 12-7 MM EX MISC
CUTANEOUS | Status: AC
  Filled 2021-10-15: qty 1

## 2021-10-15 MED ORDER — MIDAZOLAM HCL 2 MG/2ML IJ SOLN
INTRAMUSCULAR | Status: AC | PRN
  Administered 2021-10-15: .5 mg via INTRAVENOUS

## 2021-10-15 MED ORDER — SEVELAMER CARBONATE 800 MG PO TABS
800.0000 mg | ORAL_TABLET | Freq: Three times a day (TID) | ORAL | Status: DC
  Administered 2021-10-15: 800 mg via ORAL
  Filled 2021-10-15: qty 1

## 2021-10-15 MED ORDER — FENTANYL CITRATE (PF) 100 MCG/2ML IJ SOLN
INTRAMUSCULAR | Status: AC | PRN
  Administered 2021-10-15: 25 ug via INTRAVENOUS

## 2021-10-15 MED ORDER — LIDOCAINE HCL 1 % IJ SOLN
INTRAMUSCULAR | Status: AC | PRN
  Administered 2021-10-15: 10 mL via INTRADERMAL

## 2021-10-15 NOTE — Progress Notes (Signed)
? ?  VASCULAR SURGERY ASSESSMENT & PLAN:  ? ?END-STAGE RENAL DISEASE: He is having a catheter placed today and will then begin hemodialysis.  His left upper arm fistula is still small based on the duplex that was done yesterday.  Before giving up on the fistula I think it would be worth doing a fistulogram which I could potentially do tomorrow.  If the fistula is not salvageable after reviewing his previous vein map I think he would likely require placement of an AV graft. ? ?I will schedule him for a fistulogram on Monday. ? ?SUBJECTIVE:  ? ?No complaints this morning.  He is waiting to have his catheter placed. ? ?PHYSICAL EXAM:  ? ?Vitals:  ? 10/14/21 1528 10/14/21 2052 10/14/21 2329 10/15/21 0504  ?BP: 134/62 131/61 (!) 141/61 (!) 151/66  ?Pulse: 68 70 60 (!) 58  ?Resp: 17 18 18 20   ?Temp:  97.7 ?F (36.5 ?C) (!) 97.5 ?F (36.4 ?C) 97.9 ?F (36.6 ?C)  ?TempSrc:  Oral Axillary   ?SpO2: 93% 93% 92% 95%  ?Weight:  85.7 kg    ?Height:      ? ?He has a good thrill in the fistula approximately but more centrally the vein becomes small. ? ?LABS:  ? ?DUPLEX AV FISTULA: The diameters of the fistula in the upper arm ranged from 3.8-4.2 mm.  Just the vein is not adequate in size.  ? ?Lab Results  ?Component Value Date  ? WBC 5.2 10/15/2021  ? HGB 8.4 (L) 10/15/2021  ? HCT 26.0 (L) 10/15/2021  ? MCV 98.1 10/15/2021  ? PLT 96 (L) 10/15/2021  ? ?Lab Results  ?Component Value Date  ? CREATININE 7.51 (H) 10/15/2021  ? ?Lab Results  ?Component Value Date  ? INR 0.94 04/17/2013  ? ?CBG (last 3)  ?Recent Labs  ?  10/14/21 ?1146 10/14/21 ?1657 10/14/21 ?2054  ?GLUCAP 170* 158* 232*  ? ? ?PROBLEM LIST:   ? ?Principal Problem: ?  Fluid overload ?Active Problems: ?  Type 2 diabetes mellitus with hyperlipidemia ([place]) ?  Essential hypertension ?  Acute on chronic systolic CHF (congestive heart failure) ([place]) ?  Acute renal failure superimposed on stage 4 chronic kidney disease ([place]) ?  Hypothermia ?  ARF (acute renal failure) ([place]) ?  OSA  (obstructive sleep apnea) ?  Obesity hypoventilation syndrome ([place]) ? ? ?CURRENT MEDS:  ? ? allopurinol  100 mg Oral Daily  ? aspirin EC  81 mg Oral QPM  ? atorvastatin  40 mg Oral QHS  ? calcitRIOL  0.25 mcg Oral Q M,W,F  ? Chlorhexidine Gluconate Cloth  6 each Topical Q0600  ? finasteride  5 mg Oral QPM  ? heparin  5,000 Units Subcutaneous Q8H  ? insulin aspart  0-6 Units Subcutaneous TID WC  ? sevelamer carbonate  800 mg Oral TID WC  ? sodium bicarbonate  1,300 mg Oral BID  ? tamsulosin  0.4 mg Oral QHS  ? ? ?Deitra Mayo ?Office: [phone] ?10/15/2021 ? ?

## 2021-10-15 NOTE — Procedures (Signed)
Interventional Radiology Procedure Note ? ?Procedure: RT IJ HD CATH   ? ?Complications: None ? ?Estimated Blood Loss:  MIN ? ?Findings: ?TIP SVCRA   ? ?M. TREVOR Lindsay Soulliere, MD ? ? ? ?

## 2021-10-15 NOTE — Progress Notes (Addendum)
Nephrology Follow-Up Consult note ? ? ?Assessment/Recommendations: Robert Lucas is a/an 81 y.o. male with a past medical history significant for chronic systolic and diastolic HF (EF 60-45%, W0JW), CAD s/p PTCA, HTN, T2DM, CKD V who presents with Robert Lucas and volume overload. ?  ?New ESRD, Uremia: Presenting with volume overload as well as uremia likely ESRD at this point ?-Tunneled HD cath placement today with IR, appreciate help ?-1st session of HD after cath placement ?-Plan for second session of dialysis tomorrow if he remains uremic or Monday ?-AVF is not ready to use.  VVS following, likely fistulogram on Monday.  Appreciate help ?-Renally dose medications ?-Strict I/Os , Daily weights  ?-Maintain MAP>65 for optimal renal perfusion.  ?-Avoid nephrotoxic medications including NSAIDs ?-Use synthetic opioids (Fentanyl/Dilaudid) if needed ?  ?Metabolic Acidosis - likely 2/2 underlying renal disease. On oral bicarb. Expect improvement with HD. ?  ?Suspected CAP: management per primary. Awaiting blood cultures. On rocephin and azithromycin. ?  ?Anemia due to : ?-iron deficiency noted on labs from 10/05/21, will wait to replete given concern for acute infection ?-can restart home ESA after iron repletion ?-trend CBC ?-Transfuse for Hgb<7 g/dL ?  ?Chronic systolic and diastolic HF - EF 11-91%, Y7WG on most recent ECHO. Volume overloaded, but expect improvement with HD. Can slowly restart GDMT as tolerated ?  ?Hypertension: ?-BP intermittently low-normal ?-hold home antihypertensives today to assess response to HD ?  ?Diabetes Mellitus Type 2 with Hyperglycemia: ?-Management per primary ? ?Secondary hyperparathyroidism: On calcitriol.  Start sevelamer ? ? ?Recommendations conveyed to primary service.  ? ? ?Robert Lucas ?[facility] ?10/15/2021 ?8:54 AM ? ?___________________________________________________________ ? ?CC: Swelling ? ?Interval History/Subjective: Patient states he feels okay today.   Continues to have some asterixis and signs of uremia.  Otherwise denies any problems. ? ? ?Medications:  ?Current Facility-Administered Medications  ?Medication Dose Route Frequency Provider Last Rate Last Admin  ? 0.9 %  sodium chloride infusion  100 mL Intravenous PRN Robert Chew, MD      ? 0.9 %  sodium chloride infusion  100 mL Intravenous PRN Robert Chew, MD      ? acetaminophen (TYLENOL) tablet 650 mg  650 mg Oral Q6H PRN Rise Patience, MD      ? Or  ? acetaminophen (TYLENOL) suppository 650 mg  650 mg Rectal Q6H PRN Rise Patience, MD      ? allopurinol (ZYLOPRIM) tablet 100 mg  100 mg Oral Daily Rise Patience, MD   100 mg at 10/14/21 1049  ? alteplase (CATHFLO ACTIVASE) injection 2 mg  2 mg Intracatheter Once PRN Robert Chew, MD      ? aspirin EC tablet 81 mg  81 mg Oral QPM Rise Patience, MD   81 mg at 10/14/21 9562  ? atorvastatin (LIPITOR) tablet 40 mg  40 mg Oral QHS Rise Patience, MD   40 mg at 10/14/21 2159  ? azithromycin (ZITHROMAX) 500 mg in sodium chloride 0.9 % 250 mL IVPB  500 mg Intravenous Q24H Rise Patience, MD 250 mL/hr at 10/14/21 2349 500 mg at 10/14/21 2349  ? calcitRIOL (ROCALTROL) capsule 0.25 mcg  0.25 mcg Oral Q M,W,F Rise Patience, MD   0.25 mcg at 10/14/21 1049  ? cefTRIAXone (ROCEPHIN) 2 g in sodium chloride 0.9 % 100 mL IVPB  2 g Intravenous Q24H Rise Patience, MD 200 mL/hr at 10/14/21 2332 2 g at 10/14/21 2332  ? Chlorhexidine Gluconate  Cloth 2 % PADS 6 each  6 each Topical Q0600 Robert Chew, MD   6 each at 10/15/21 0554  ? fentaNYL (SUBLIMAZE) 100 MCG/2ML injection           ? finasteride (PROSCAR) tablet 5 mg  5 mg Oral QPM Rise Patience, MD   5 mg at 10/14/21 0881  ? flumazenil (ROMAZICON) 0.5 MG/5ML injection           ? heparin injection 1,000 Units  1,000 Units Dialysis PRN Robert Chew, MD      ? heparin injection 5,000 Units  5,000 Units Subcutaneous Q8H Robert Chew, MD      ?  hydrALAZINE (APRESOLINE) injection 10 mg  10 mg Intravenous Q4H PRN Rise Patience, MD      ? insulin aspart (novoLOG) injection 0-6 Units  0-6 Units Subcutaneous TID WC Rise Patience, MD   1 Units at 10/14/21 1823  ? midazolam (VERSED) 2 MG/2ML injection           ? naloxone (NARCAN) 0.4 MG/ML injection           ? pentafluoroprop-tetrafluoroeth (GEBAUERS) aerosol 1 application.  1 application. Topical PRN Robert Chew, MD      ? sevelamer carbonate (RENVELA) tablet 800 mg  800 mg Oral TID WC Robert Chew, MD      ? sodium bicarbonate tablet 1,300 mg  1,300 mg Oral BID Robert Chew, MD   1,300 mg at 10/14/21 2159  ? tamsulosin (FLOMAX) capsule 0.4 mg  0.4 mg Oral QHS Rise Patience, MD   0.4 mg at 10/14/21 2159  ?  ? ? ?Review of Systems: ?10 systems reviewed and negative except per interval history/subjective ? ?Physical Exam: ?Vitals:  ? 10/14/21 2329 10/15/21 0504  ?BP: (!) 141/61 (!) 151/66  ?Pulse: 60 (!) 58  ?Resp: 18 20  ?Temp: (!) 97.5 ?F (36.4 ?C) 97.9 ?F (36.6 ?C)  ?SpO2: 92% 95%  ? ?No intake/output data recorded. ? ?Intake/Output Summary (Last 24 hours) at 10/15/2021 0854 ?Last data filed at 10/15/2021 0100 ?Gross per 24 hour  ?Intake 35.58 ml  ?Output 0 ml  ?Net 35.58 ml  ? ?Constitutional: Ill-appearing, lying in bed, no distress ?ENMT: ears and nose without scars or lesions, MMM ?CV: normal rate, 2+ pitting edema of the bilateral lower extremities ?Respiratory: Bilateral chest rise with no increased work of breathing ?Gastrointestinal: soft, non-tender, nondistended ?Skin: no visible lesions or rashes ?Psych: alert, judgement/insight appropriate, appropriate mood and affect ?Neuro: Asterixis present, some jittering borderline myoclonic activity ? ? ?Test Results ?I personally reviewed new and old clinical labs and radiology tests ?Lab Results  ?Component Value Date  ? NA 136 10/15/2021  ? K 4.2 10/15/2021  ? CL 107 10/15/2021  ? CO2 13 (L) 10/15/2021  ? BUN 123 (H)  10/15/2021  ? CREATININE 7.51 (H) 10/15/2021  ? CALCIUM 7.3 (L) 10/15/2021  ? ALBUMIN 3.0 (L) 10/15/2021  ? PHOS 10.9 (H) 10/15/2021  ? ? ?CBC ?Recent Labs  ?Lab 10/14/21 ?0257 10/14/21 ?1026 10/15/21 ?0257  ?WBC 4.8 4.7 5.2  ?NEUTROABS 3.8 3.8 4.0  ?HGB 8.3* 8.3* 8.4*  ?HCT 25.7* 25.7* 26.0*  ?MCV 97.0 97.7 98.1  ?PLT 103* 105* 96*  ? ? ? ? ? ?

## 2021-10-15 NOTE — Progress Notes (Signed)
removed 1077mls net fluid no complaints no complications tolerated tx well. pre bp 109/69 post bp 165/74 pre weight 95.0kg post weight 94.0kg.  catheter ran well. packed with heparin clamped and capped. ?

## 2021-10-15 NOTE — Progress Notes (Signed)
?PROGRESS NOTE ? ? ? ?Robert Lucas  UTM:[id number] DOB: 03/09/1941 DOA: 10/13/2021 ?PCP: [name], NP  ? ?Brief Narrative:  ? Robert Lucas is a 81 y.o. WM PMHx chronic systolic and diastolic CHF last EF measured in February 2023 was 30 to 35% with grade 2 diastolic dysfunction, CAD S/P PTCA, CKD stage V admitted in February for fluid overload improved with diuresis.  OSA/OHS ? ?Has been having increasing worsening shortness of breath for the last few weeks presently will address denies any associated chest pain.  Has been having some productive cough for the last 2 weeks.  Denies fever chills.  Has been making urine.  Present to the ER at med center. ?  ?ED Course: In the ER patient was short of breath but not hypoxic chest x-ray shows congestion and possible infiltrate.  Patient has been hypothermic in the ER and had consistently remained hypothermic with temperature of 94 ?F.  Lab work show creatinine of 6.95 which has progressively worsened from 4.8 in September 07, 2021 and is around 6.08 in October 05, 2021 that is around a week ago and it is around 6.95.  Potassium is 4.3 bicarb of 16 anion gap of 16.  BNP is 1500 and I sensitive troponin was 30 and 27.  ER physician discussed with on-call nephrologist who advised patient to be placed on 80 mg IV Lasix.  On my exam patient's abdomen appears distended and chest appears mildly rhonchorous.  Patient's blood pressure shortly after I admitted has been in the low normal's.  Patient still remains hypothermic.  Plan is to place patient on Bair hugger and get blood cultures and since chest x-ray does show infiltrate and patient is having productive cough we will start patient on antibiotics. ? ? ?Subjective: ?4/15 afebrile overnight patient somnolent, only responds to painful stimuli.  Does not follow commands. ? ? ?Assessment & Plan: ? Covid vaccination; ? ?Principal Problem: ?  Fluid overload ?Active Problems: ?  Type 2 diabetes mellitus with hyperlipidemia  ([place]) ?  Essential hypertension ?  Acute on chronic systolic CHF (congestive heart failure) ([place]) ?  Acute renal failure superimposed on stage 4 chronic kidney disease ([place]) ?  Hypothermia ?  ARF (acute renal failure) ([place]) ?  OSA (obstructive sleep apnea) ?  Obesity hypoventilation syndrome ([place]) ? ?Acute on CKD stage V (baseline Cr 4.8 in September 07, 2021) ?Lab Results  ?Component Value Date  ? CREATININE 7.51 (H) 10/15/2021  ? CREATININE 7.40 (H) 10/14/2021  ? CREATININE 7.17 (H) 10/14/2021  ? CREATININE 6.95 (H) 10/13/2021  ? CREATININE 6.08 (H) 10/05/2021  ? ?-4/14 discussed case with Dr Joylene Grapes [facility] they are familiar with patient, have seen him in clinic before.  Plan is for placement of Vas-Cath by IR and then HD. ?-4/14 diuretics per Nephrology ? ?DM nephropathy ? ?Acute on chronic systolic CHF ?-February 5681 EF of 30 to 35% with grade 2 diastolic dysfunction ?- Strict in and out ?- Daily weight ? ?Essential HTN ?- PRN hydralazine ? ?CAD ?- Trend cardiac markers, ?-4/13 check BNP= 1500.6 ? ?DM type II controlled with complication ?- 4/14 A1c pending ?- 414 lipid panel pending ? 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 ?- Anemia panel pending ? ?OSA ?- CPAP per respiratory ? ?Altered ental status ?- 4/15 most likely secondary to patient being uremic.  Scheduled for HD this morning. ? ?Gout ?- Uric acid pending ? ?Diarrhea ?- 4/14 patient states has not had any episodes of diarrhea in the last 24  hours. ? ?OSA/OHS ?- 4/14 CPAP per respiratory therapy settings ? ?Obesity (BMI 33.47 kg/m?) ? ? ? ?Mobility Assessment (last 72 hours)   ? ? Mobility Assessment   ? ? [name] 10/14/21 2300 10/14/21 2257 10/14/21 0900 10/13/21 2120  ?  ? Does patient have an order for bedrest or is patient medically unstable No - Continue assessment No - Continue assessment No - Continue assessment No - Continue assessment   ? What is the highest level of mobility based on the progressive mobility assessment? Level 5 (Walks with assist in  room/hall) - Balance while stepping forward/back and can walk in room with assist - Complete Level 5 (Walks with assist in room/hall) - Balance while stepping forward/back and can walk in room with assist - Complete Level 5 (Walks with assist in room/hall) - Balance while stepping forward/back and can walk in room with assist - Complete Level 5 (Walks with assist in room/hall) - Balance while stepping forward/back and can walk in room with assist - Complete   ? ?  ?  ? ?  ? ? ?DVT prophylaxis:  ?Code Status:  ?Family Communication:  ?Status is: Inpatient ? ? ? ?Dispo: The patient is from: Home ?             Anticipated d/c is to: Home ?             Anticipated d/c date is: 3 days ?             Patient currently is not medically stable to d/c. ? ? ? ? ? ?Consultants:  ?Nephrology ?IR ? ?Procedures/Significant Events:  ?4/15 RIGHT IJ HD cath>>>>> ? ? ? ?I have personally reviewed and interpreted all radiology studies and my findings are as above. ? ?VENTILATOR SETTINGS: ? ? ? ?Cultures ? ? ?Antimicrobials: ? ? ? ?Devices ?  ? ?LINES / TUBES:  ? ? ? ? ?Continuous Infusions: ? sodium chloride    ? sodium chloride    ? azithromycin 500 mg (10/14/21 2349)  ? cefTRIAXone (ROCEPHIN)  IV 2 g (10/14/21 2332)  ? vancomycin    ? ? ? ?Objective: ?Vitals:  ? 10/15/21 0955 10/15/21 1000 10/15/21 1005 10/15/21 1010  ?BP: 126/74 122/86 125/67 (!) 94/59  ?Pulse: 68 74 73 66  ?Resp: 14 19 18 20   ?Temp:      ?TempSrc:      ?SpO2: 94% 92% 93% 91%  ?Weight:      ?Height:      ? ? ?Intake/Output Summary (Last 24 hours) at 10/15/2021 1101 ?Last data filed at 10/15/2021 0100 ?Gross per 24 hour  ?Intake 35.58 ml  ?Output 0 ml  ?Net 35.58 ml  ? ? ?Filed Weights  ? 10/13/21 1522 10/14/21 2052  ?Weight: 79.8 kg 85.7 kg  ? ? ?Examination: ? ?General: Somnolent, withdraws to painful stimuli.  Does not follow commands.  No acute respiratory distress ?Eyes: negative scleral hemorrhage, negative anisocoria, negative icterus ?ENT: Negative Runny nose,  negative gingival bleeding, ?Neck:  Negative scars, masses, torticollis, lymphadenopathy, JVD ?Lungs: decreased breath sounds bilaterally, positive expiratory wheezing  ?Cardiovascular: Regular rate and rhythm without murmur gallop or rub normal S1 and S2 ?Abdomen: Morbidly obese, negative abdominal pain, nondistended, positive soft, bowel sounds, no rebound, no ascites, no appreciable mass ?Extremities: No significant cyanosis, clubbing, or edema bilateral lower extremities ?Skin: Negative rashes, lesions, ulcers ?Psychiatric: Unable to assess secondary to altered mental status ?Central nervous system: Withdraws to painful stimuli only. ? ?.  ? ? ? ?  Data Reviewed: Care during the described time interval was provided by me .  I have reviewed this patient's available data, including medical history, events of note, physical examination, and all test results as part of my evaluation.  ? ?CBC: ?Recent Labs  ?Lab 10/13/21 ?1555 10/14/21 ?0257 10/14/21 ?1026 10/15/21 ?0257  ?WBC 5.4 4.8 4.7 5.2  ?NEUTROABS 4.3 3.8 3.8 4.0  ?HGB 8.8* 8.3* 8.3* 8.4*  ?HCT 27.0* 25.7* 25.7* 26.0*  ?MCV 95.7 97.0 97.7 98.1  ?PLT 138* 103* 105* 96*  ? ? ?Basic Metabolic Panel: ?Recent Labs  ?Lab 10/13/21 ?1555 10/14/21 ?0257 10/14/21 ?1026 10/15/21 ?0257  ?NA 132* 132* 135 136  ?K 4.3 4.3 4.3 4.2  ?CL 100 104 105 107  ?CO2 16* 15* 15* 13*  ?GLUCOSE 244* 183* 167* 202*  ?BUN 118* 118* 121* 123*  ?CREATININE 6.95* 7.17* 7.40* 7.51*  ?CALCIUM 7.8* 7.3* 7.4* 7.3*  ?MG  --   --  1.6* 1.7  ?PHOS  --   --  10.4* 10.9*  ? ? ?GFR: ?Estimated Creatinine Clearance: 7.3 mL/min (A) (by C-G formula based on SCr of 7.51 mg/dL (H)). ?Liver Function Tests: ?Recent Labs  ?Lab 10/13/21 ?1555 10/14/21 ?0257 10/14/21 ?1026 10/15/21 ?0257  ?AST 16 15 18 16   ?ALT 17 18 17 18   ?ALKPHOS 75 71 71 69  ?BILITOT 0.7 0.8 1.0 0.5  ?PROT 6.9 6.2* 5.9* 6.0*  ?ALBUMIN 4.0 3.0* 3.2* 3.0*  ? ? ?No results for input(s): LIPASE, AMYLASE in the last 168 hours. ?No results for  input(s): AMMONIA in the last 168 hours. ?Coagulation Profile: ?No results for input(s): INR, PROTIME in the last 168 hours. ?Cardiac Enzymes: ?No results for input(s): CKTOTAL, CKMB, CKMBINDEX, TROPONINI in the l

## 2021-10-15 NOTE — Progress Notes (Signed)
Spoke with pt about wearing CPAP QHS. Pt states he doesn't use CPAP at home, and doesn't feel like he needs one while admitted. Order discontinued per RT protocol, pt aware that if he decides he wants to try CPAP, he can notify for RT and we will assist with setting up machine. RT will monitor as needed.  ?

## 2021-10-15 NOTE — Significant Event (Signed)
Rapid Response Event Note  ? ?Reason for Call :  ?2nd set of eye for pt in fluid overload ? ?Initial Focused Assessment:  ?Pt lying in bed with eyes closed. He is alert and oriented, denies CP/SOB. Lungs are clear and diminished. He does  have some intermittent ABD breathing present. Heart murmur present. Pt has generalized edema t/o. +JVD. ABD large/distended. Skin warm and dry.  ? ?T-97.5, HR-60, BP-141/61, RR-18, SpO2-92% on RA.  ? ?Plan for pt to get [facility] placed in IR in AM, then plan for HD.  ? ?Interventions:  ?No RRT interventions needed.  ? ?Plan of Care:  ?Pt with s/s fluid overload however breathing isn't labored and VSS. Plan for [facility] and HD tomorrow. Continue to monitor pt closely. Call RRT if further assistance needed.  ? ?Event Summary:  ? ?MD Notified:  ?Call Time:0008 ?Arrival Time:0025 ?End Time:0043 ? ?Dillard Essex, RN ?

## 2021-10-16 ENCOUNTER — Inpatient Hospital Stay (HOSPITAL_COMMUNITY): Payer: Medicare Other

## 2021-10-16 DIAGNOSIS — J9602 Acute respiratory failure with hypercapnia: Secondary | ICD-10-CM | POA: Diagnosis not present

## 2021-10-16 DIAGNOSIS — T68XXXA Hypothermia, initial encounter: Secondary | ICD-10-CM | POA: Diagnosis not present

## 2021-10-16 DIAGNOSIS — G9341 Metabolic encephalopathy: Secondary | ICD-10-CM

## 2021-10-16 DIAGNOSIS — J690 Pneumonitis due to inhalation of food and vomit: Secondary | ICD-10-CM

## 2021-10-16 DIAGNOSIS — J9601 Acute respiratory failure with hypoxia: Secondary | ICD-10-CM | POA: Diagnosis not present

## 2021-10-16 LAB — CBC WITH DIFFERENTIAL/PLATELET
Abs Immature Granulocytes: 0.02 10*3/uL (ref 0.00–0.07)
Basophils Absolute: 0 10*3/uL (ref 0.0–0.1)
Basophils Relative: 1 %
Eosinophils Absolute: 0.1 10*3/uL (ref 0.0–0.5)
Eosinophils Relative: 2 %
HCT: 27.5 % — ABNORMAL LOW (ref 39.0–52.0)
Hemoglobin: 8.7 g/dL — ABNORMAL LOW (ref 13.0–17.0)
Immature Granulocytes: 0 %
Lymphocytes Relative: 14 %
Lymphs Abs: 0.7 10*3/uL (ref 0.7–4.0)
MCH: 31.1 pg (ref 26.0–34.0)
MCHC: 31.6 g/dL (ref 30.0–36.0)
MCV: 98.2 fL (ref 80.0–100.0)
Monocytes Absolute: 0.5 10*3/uL (ref 0.1–1.0)
Monocytes Relative: 10 %
Neutro Abs: 3.6 10*3/uL (ref 1.7–7.7)
Neutrophils Relative %: 73 %
Platelets: 85 10*3/uL — ABNORMAL LOW (ref 150–400)
RBC: 2.8 MIL/uL — ABNORMAL LOW (ref 4.22–5.81)
RDW: 17.1 % — ABNORMAL HIGH (ref 11.5–15.5)
WBC: 5 10*3/uL (ref 4.0–10.5)
nRBC: 0 % (ref 0.0–0.2)

## 2021-10-16 LAB — COMPREHENSIVE METABOLIC PANEL
ALT: 17 U/L (ref 0–44)
AST: 21 U/L (ref 15–41)
Albumin: 3.2 g/dL — ABNORMAL LOW (ref 3.5–5.0)
Alkaline Phosphatase: 73 U/L (ref 38–126)
Anion gap: 13 (ref 5–15)
BUN: 73 mg/dL — ABNORMAL HIGH (ref 8–23)
CO2: 20 mmol/L — ABNORMAL LOW (ref 22–32)
Calcium: 7.1 mg/dL — ABNORMAL LOW (ref 8.9–10.3)
Chloride: 104 mmol/L (ref 98–111)
Creatinine, Ser: 5.38 mg/dL — ABNORMAL HIGH (ref 0.61–1.24)
GFR, Estimated: 10 mL/min — ABNORMAL LOW (ref >60)
Glucose, Bld: 194 mg/dL — ABNORMAL HIGH (ref 70–99)
Potassium: 3.6 mmol/L (ref 3.5–5.1)
Sodium: 137 mmol/L (ref 135–145)
Total Bilirubin: 0.6 mg/dL (ref 0.3–1.2)
Total Protein: 6.4 g/dL — ABNORMAL LOW (ref 6.5–8.1)

## 2021-10-16 LAB — GLUCOSE, CAPILLARY
Glucose-Capillary: 162 mg/dL — ABNORMAL HIGH (ref 70–99)
Glucose-Capillary: 211 mg/dL — ABNORMAL HIGH (ref 70–99)
Glucose-Capillary: 169 mg/dL — ABNORMAL HIGH (ref 70–99)
Glucose-Capillary: 178 mg/dL — ABNORMAL HIGH (ref 70–99)
Glucose-Capillary: 81 mg/dL (ref 70–99)
Glucose-Capillary: 83 mg/dL (ref 70–99)
Glucose-Capillary: 94 mg/dL (ref 70–99)

## 2021-10-16 LAB — BLOOD GAS, ARTERIAL
Acid-base deficit: 7.1 mmol/L — ABNORMAL HIGH (ref 0.0–2.0)
Bicarbonate: 23.1 mmol/L (ref 20.0–28.0)
Drawn by: 33099
O2 Saturation: 90.5 %
Patient temperature: 36.4
pCO2 arterial: 66 mmHg (ref 32–48)
pH, Arterial: 7.15 — CL (ref 7.35–7.45)
pO2, Arterial: 62 mmHg — ABNORMAL LOW (ref 83–108)

## 2021-10-16 LAB — POCT I-STAT 7, (LYTES, BLD GAS, ICA,H+H)
Acid-base deficit: 2 mmol/L (ref 0.0–2.0)
Bicarbonate: 24 mmol/L (ref 20.0–28.0)
Calcium, Ion: 1 mmol/L — ABNORMAL LOW (ref 1.15–1.40)
HCT: 27 % — ABNORMAL LOW (ref 39.0–52.0)
Hemoglobin: 9.2 g/dL — ABNORMAL LOW (ref 13.0–17.0)
O2 Saturation: 100 %
Potassium: 3.5 mmol/L (ref 3.5–5.1)
Sodium: 138 mmol/L (ref 135–145)
TCO2: 25 mmol/L (ref 22–32)
pCO2 arterial: 46.5 mmHg (ref 32–48)
pH, Arterial: 7.321 — ABNORMAL LOW (ref 7.35–7.45)
pO2, Arterial: 345 mmHg — ABNORMAL HIGH (ref 83–108)

## 2021-10-16 LAB — MRSA NEXT GEN BY PCR, NASAL: MRSA by PCR Next Gen: NOT DETECTED

## 2021-10-16 LAB — PHOSPHORUS: Phosphorus: 7.7 mg/dL — ABNORMAL HIGH (ref 2.5–4.6)

## 2021-10-16 LAB — MAGNESIUM: Magnesium: 1.6 mg/dL — ABNORMAL LOW (ref 1.7–2.4)

## 2021-10-16 MED ORDER — DEXMEDETOMIDINE HCL IN NACL 400 MCG/100ML IV SOLN
0.0000 ug/kg/h | INTRAVENOUS | Status: DC
  Administered 2021-10-16: 0.4 ug/kg/h via INTRAVENOUS
  Filled 2021-10-16: qty 100

## 2021-10-16 MED ORDER — PANTOPRAZOLE 2 MG/ML SUSPENSION: 40.0000 mg | Freq: Every day | ORAL | Status: DC

## 2021-10-16 MED ORDER — PANTOPRAZOLE SODIUM 40 MG IV SOLR
40.0000 mg | Freq: Every day | INTRAVENOUS | Status: DC
  Administered 2021-10-16: 40 mg via INTRAVENOUS
  Filled 2021-10-16: qty 10

## 2021-10-16 MED ORDER — ETOMIDATE 2 MG/ML IV SOLN: 20.0000 mg | Freq: Once | INTRAVENOUS | Status: AC

## 2021-10-16 MED ORDER — POTASSIUM CHLORIDE 20 MEQ PO PACK: 40.0000 meq | PACK | Freq: Once | ORAL | Status: DC

## 2021-10-16 MED ORDER — ALLOPURINOL 100 MG PO TABS
100.0000 mg | ORAL_TABLET | Freq: Every day | ORAL | Status: DC
  Administered 2021-10-16: 100 mg
  Filled 2021-10-16: qty 1

## 2021-10-16 MED ORDER — GUAIFENESIN-DM 100-10 MG/5ML PO SYRP
5.0000 mL | ORAL_SOLUTION | ORAL | Status: DC | PRN
  Filled 2021-10-16: qty 5

## 2021-10-16 MED ORDER — FENTANYL CITRATE (PF) 100 MCG/2ML IJ SOLN: 25.0000 ug | INTRAMUSCULAR | Status: DC | PRN

## 2021-10-16 MED ORDER — CALCITRIOL 0.25 MCG PO CAPS
0.2500 ug | ORAL_CAPSULE | ORAL | Status: DC
  Filled 2021-10-16: qty 1

## 2021-10-16 MED ORDER — ETOMIDATE 2 MG/ML IV SOLN
INTRAVENOUS | Status: AC
  Administered 2021-10-16: 20 mg via INTRAVENOUS
  Filled 2021-10-16: qty 20

## 2021-10-16 MED ORDER — ROCURONIUM BROMIDE 10 MG/ML (PF) SYRINGE
PREFILLED_SYRINGE | INTRAVENOUS | Status: AC
Start: 2021-10-16 — End: 2021-10-16
  Administered 2021-10-16: 80 mg via INTRAVENOUS
  Filled 2021-10-16: qty 10

## 2021-10-16 MED ORDER — FENTANYL CITRATE (PF) 100 MCG/2ML IJ SOLN: 100.0000 ug | Freq: Once | INTRAMUSCULAR | Status: AC

## 2021-10-16 MED ORDER — MIDAZOLAM HCL 2 MG/2ML IJ SOLN
INTRAMUSCULAR | Status: AC
  Filled 2021-10-16: qty 2

## 2021-10-16 MED ORDER — ROCURONIUM BROMIDE 50 MG/5ML IV SOLN
80.0000 mg | Freq: Once | INTRAVENOUS | Status: AC
Start: 2021-10-16 — End: 2021-10-16
  Filled 2021-10-16: qty 8

## 2021-10-16 MED ORDER — LOPERAMIDE HCL 1 MG/7.5ML PO SUSP
2.0000 mg | ORAL | Status: DC | PRN
  Filled 2021-10-16: qty 15

## 2021-10-16 MED ORDER — MAGNESIUM SULFATE IN D5W 1-5 GM/100ML-% IV SOLN
1.0000 g | Freq: Once | INTRAVENOUS | Status: AC
  Administered 2021-10-16: 1 g via INTRAVENOUS
  Filled 2021-10-16: qty 100

## 2021-10-16 MED ORDER — OXYCODONE-ACETAMINOPHEN 5-325 MG PO TABS: 1.0000 | ORAL_TABLET | Freq: Four times a day (QID) | ORAL | Status: DC | PRN

## 2021-10-16 MED ORDER — LOPERAMIDE HCL 2 MG PO CAPS: 2.0000 mg | ORAL_CAPSULE | ORAL | Status: DC | PRN

## 2021-10-16 MED ORDER — ASPIRIN 81 MG PO CHEW
81.0000 mg | CHEWABLE_TABLET | Freq: Every day | ORAL | Status: DC
  Administered 2021-10-16: 81 mg
  Filled 2021-10-16: qty 1

## 2021-10-16 MED ORDER — POLYETHYLENE GLYCOL 3350 17 G PO PACK
17.0000 g | PACK | Freq: Every day | ORAL | Status: DC
  Administered 2021-10-16: 17 g
  Filled 2021-10-16: qty 1

## 2021-10-16 MED ORDER — INSULIN ASPART 100 UNIT/ML IJ SOLN
0.0000 [IU] | INTRAMUSCULAR | Status: DC
  Administered 2021-10-16: 2 [IU] via SUBCUTANEOUS
  Administered 2021-10-17: 3 [IU] via SUBCUTANEOUS
  Administered 2021-10-17: 2 [IU] via SUBCUTANEOUS
  Administered 2021-10-18 (×2): 1 [IU] via SUBCUTANEOUS
  Administered 2021-10-18: 2 [IU] via SUBCUTANEOUS
  Administered 2021-10-19: 1 [IU] via SUBCUTANEOUS

## 2021-10-16 MED ORDER — FENTANYL CITRATE (PF) 100 MCG/2ML IJ SOLN
INTRAMUSCULAR | Status: AC
  Administered 2021-10-16: 100 ug via INTRAVENOUS
  Filled 2021-10-16: qty 2

## 2021-10-16 MED ORDER — ACETAMINOPHEN 650 MG RE SUPP
650.0000 mg | Freq: Four times a day (QID) | RECTAL | Status: DC | PRN
Start: 2021-10-16 — End: 2021-10-17

## 2021-10-16 MED ORDER — KETAMINE HCL 50 MG/5ML IJ SOSY
PREFILLED_SYRINGE | INTRAMUSCULAR | Status: AC
  Filled 2021-10-16: qty 5

## 2021-10-16 MED ORDER — ATORVASTATIN CALCIUM 40 MG PO TABS: 40.0000 mg | ORAL_TABLET | Freq: Every day | ORAL | Status: DC

## 2021-10-16 MED ORDER — ONDANSETRON HCL 4 MG/2ML IJ SOLN
4.0000 mg | Freq: Four times a day (QID) | INTRAMUSCULAR | Status: DC | PRN
Start: 2021-10-16 — End: 2021-10-19
  Administered 2021-10-16 – 2021-10-17 (×3): 4 mg via INTRAVENOUS
  Filled 2021-10-16 (×3): qty 2

## 2021-10-16 MED ORDER — PHENYLEPHRINE 40 MCG/ML (10ML) SYRINGE FOR IV PUSH (FOR BLOOD PRESSURE SUPPORT)
PREFILLED_SYRINGE | INTRAVENOUS | Status: AC
  Filled 2021-10-16: qty 10

## 2021-10-16 MED ORDER — ACETAMINOPHEN 325 MG PO TABS
650.0000 mg | ORAL_TABLET | Freq: Four times a day (QID) | ORAL | Status: DC | PRN
Start: 2021-10-16 — End: 2021-10-17

## 2021-10-16 MED ORDER — DOCUSATE SODIUM 50 MG/5ML PO LIQD
100.0000 mg | Freq: Two times a day (BID) | ORAL | Status: DC
  Administered 2021-10-16: 100 mg
  Filled 2021-10-16: qty 10

## 2021-10-16 NOTE — Progress Notes (Signed)
PCCM Progress Note ? ?Tolerated hemodialysis ?Passed SBT/WUA ?Extubated successfully to 2L O2 via [place] ? ?Rodman Pickle, M.D. ?[facility] ?10/16/2021 4:43 PM  ? ?See Amion for personal pager ?For hours between 7 PM to 7 AM, please call Elink for urgent questions ?  ?

## 2021-10-16 NOTE — Procedures (Signed)
Intubation Procedure Note ? ?DADRIAN BALLANTINE  ?[id number]  ?01/04/1941 ? ?Date:10/16/21  ?Time:3:40 AM  ? ?Provider Performing:Chong January Cleon Dew  ? ? ?Procedure: Intubation (00459) ? ?Indication(s) ?Respiratory Failure ? ?Consent ?Unable to obtain consent due to emergent nature of procedure. ? ? ?Anesthesia ?Etomidate, Fentanyl, and Rocuronium ? ? ?Time Out ?Verified patient identification, verified procedure, site/side was marked, verified correct patient position, special equipment/implants available, medications/allergies/relevant history reviewed, required imaging and test results available. ? ? ?Sterile Technique ?Usual hand hygeine, masks, and gloves were used ? ? ?Procedure Description ?Patient positioned in bed supine.  Sedation given as noted above.  Patient was intubated with endotracheal tube using Glidescope.  View was Grade 1 full glottis .  Number of attempts was 1.  Colorimetric CO2 detector was consistent with tracheal placement. ? ? ?Complications/Tolerance ?None; patient tolerated the procedure well. ?Chest X-ray is ordered to verify placement. ? ? ?EBL ?Minimal ? ? ?Specimen(s) ?None ? ?Redmond School., MSN, APRN, AGACNP-BC ?[facility]  ?10/16/2021 , 3:41 AM ? ?Please see [email or web address] for pager details ? ?If no response, please call [phone] ?After hours, please call Elink at [phone] ? ?

## 2021-10-16 NOTE — Progress Notes (Signed)
STAT ABG collected, sent to lab.  ?

## 2021-10-16 NOTE — Consult Note (Signed)
? ?NAME:  Robert Lucas, MRN:  [id number], DOB:  [birth year], LOS: 3 ?ADMISSION DATE:  10/13/2021, CONSULTATION DATE:  4/16 ?REFERRING MD:  Alcario Drought, REASON FOR CONSULT:  Hypoxia  ? ?History of Present Illness:  ?Patient is encephalopathic and/or intubated. Therefore history has been obtained from chart review.  ? ?Robert Lucas, is a 81 y.o. male, who was admitted on 4/13 after increasing shortness of breath over a few weeks.  He was found to have worsening creatinine with worsening volume overload. He was admitted to the hospitalist service ? ?They have a pertinent past medical history of CKD stage V, HFrEF (02/23 EF 30-35%), CAD, HTN, DM 2, OSA/OHS, obesity ? ?The morning of 4/16 called to the bedside for hypoxia and altered mental status.  Patient received Percocet around 2200.  Patient working with nurse, vomited, patient aspirated around 2 AM on 4/16.  Went from no oxygen requirement to nonrebreather.  Patient minimally responsive to pain and nonverbal. ABG 7.15/66/62/23.  Emergently transferred to the ICU and intubated. ? ?PCCM assumed care. ? ?Pertinent  Medical History  ?CKD stage V, HFrEF (02/23 EF 30-35%), CAD, HTN, DM 2, OSA/OHS, obesity ? ?Significant Hospital Events: ?Including procedures, antibiotic start and stop dates in addition to other pertinent events   ?4/13 admit ?4/15 right IJ HD cath placed.  First HD 1 L removed. ?4/16 PCCM consult, intubated, Rocephin/azithromycin ? ?Interim History / Subjective:  ?See above ? ?Unable to obtain subjective evaluation due to patient status ? ? ?Objective   ?Blood pressure 117/72, pulse (!) 102, temperature (!) 97.4 ?F (36.3 ?C), temperature source Oral, resp. rate (!) 23, height 5\' 3"  (1.6 m), weight 76.6 kg, SpO2 (!) 86 %. ?   ?   ? ?Intake/Output Summary (Last 24 hours) at 10/16/2021 0316 ?Last data filed at 10/15/2021 1444 ?Gross per 24 hour  ?Intake 0 ml  ?Output 1000 ml  ?Net -1000 ml  ? ?Filed Weights  ? 10/15/21 1233 10/15/21 1444 10/15/21 2002   ?Weight: 80 kg 79.1 kg 76.6 kg  ? ? ?Examination: ?General: In bed, some distress, unwell appearing ?HEENT: MM pink/moist, anicteric, atraumatic ?Neuro: RASS -3, PERRL 18mm, eyes open to pain, nonverbal ?CV: S1S2, NSR, no m/r/g appreciated ?PULM:  Coarse in the upper lobes, coarse in the lower lobes, trachea midline, chest expansion symmetric ?GI: soft, bsx4 active, non-tender   ?Extremities: warm/dry, no pretibial edema, capillary refill less than 3 seconds  ?Skin: no rashes or lesions noted ? ?Chest x-ray: Concerning for new bibasilar infiltrate in bilateral lobes ?ABG 7.15/66/62/23 ?Glucose 194 ?BUN 73, creatinine 5.38 ?Albumin 3.2 ?Magnesium 1.6 ?Phos 7.7 ?Hemoglobin 8.4> 8.7 ?Platelets 105> 96> 85 ? ?Resolved Hospital Problem list   ? ? ?Assessment & Plan:  ?Acute respiratory failure with hypoxia and hypercapnia secondary to aspiration event ?No prior O2 requirement. Received percocet at [place] at 2157. Began vomiting at 0200. Aspirated . ABG 7.15/66/62/23 on NRB. CXR with new bibasilar infiltrates. Now intubated. ?-TXR to ICU ?-LTVV strategy with tidal volumes of 4-8 cc/kg ideal body weight ?-Goal plateau pressures less than 30 and driving pressures less than 15 ?-Wean PEEP/FiO2 for SpO2 92-98% ?-VAP bundle ?-Daily SAT and SBT ?-PAD bundle with Precedex gtt and fentanyl push ?-RASS goal 0 to -1 ?-Follow up CXR and ABG ?-Continue ceftriaxone and azithromycin ? ?Acute metabolic encephalopathy, secondary to hypercapnia, patient previously altered secondary to uremia ?-supportive care as above ?-monitor neuro exam ? ?Bradycardia ?Now in NSR. Believe secondary to aspiration event vs vagal. ?-cont tele ?-Goal  K above 4, goal MG above 2 ? ?Acute on CKD stage V ?New Vas-Cath in place, last HD 4/15.  Patient reportedly making urine ?-Nephrology following ?-Follow BMP ?-On oral bicarbonate ? ?Acute on chronic systolic CHF, February 1856 EF 30-35% with grade 2 diastolic dysfunction ?CAD ?Hypertension ?- hold home  antihypertensives ?-Continuous telemetry ?-asa 81 ?-Follow-up 12 lead ? ?DM2 ?-Blood Glucose goal 140-180. ?-SSI ? ?Normocytic anemia ?History of CKD, hemoglobin 8.4 ?-Transfuse PRBC if HBG less than 7 ?-Obtain AM CBC to trend H&H ?-Monitor for signs of bleeding ? ?Thrombocytopenia ?Platelets 105> 96> 85.  Etiology unclear at this time ?-Monitor on a.m. labs ?-SCD ? ?Hypomagnesia ?-Giving 1 g of magnesium in the setting of CKD ?-Recheck on a.m. labs ? ?Best Practice (right click and "Reselect all SmartList Selections" daily)  ? ?Diet/type: NPO w/ oral meds ?DVT prophylaxis: SCD ?GI prophylaxis: PPI ?Lines: N/A ?Foley:  N/A ?Code Status:  full code ?Last date of multidisciplinary goals of care discussion [4/16 called wife on phone to update on transition to ICU.  Remains full code per wife ? ?Labs   ?CBC: ?Recent Labs  ?Lab 10/13/21 ?1555 10/14/21 ?0257 10/14/21 ?1026 10/15/21 ?0257  ?WBC 5.4 4.8 4.7 5.2  ?NEUTROABS 4.3 3.8 3.8 4.0  ?HGB 8.8* 8.3* 8.3* 8.4*  ?HCT 27.0* 25.7* 25.7* 26.0*  ?MCV 95.7 97.0 97.7 98.1  ?PLT 138* 103* 105* 96*  ? ? ?Basic Metabolic Panel: ?Recent Labs  ?Lab 10/13/21 ?1555 10/14/21 ?0257 10/14/21 ?1026 10/15/21 ?0257  ?NA 132* 132* 135 136  ?K 4.3 4.3 4.3 4.2  ?CL 100 104 105 107  ?CO2 16* 15* 15* 13*  ?GLUCOSE 244* 183* 167* 202*  ?BUN 118* 118* 121* 123*  ?CREATININE 6.95* 7.17* 7.40* 7.51*  ?CALCIUM 7.8* 7.3* 7.4* 7.3*  ?MG  --   --  1.6* 1.7  ?PHOS  --   --  10.4* 10.9*  ? ?GFR: ?Estimated Creatinine Clearance: 7 mL/min (A) (by C-G formula based on SCr of 7.51 mg/dL (H)). ?Recent Labs  ?Lab 10/13/21 ?1555 10/14/21 ?0257 10/14/21 ?3149 10/14/21 ?1026 10/15/21 ?0257  ?WBC 5.4 4.8  --  4.7 5.2  ?LATICACIDVEN  --  0.7 0.6  --   --   ? ? ?Liver Function Tests: ?Recent Labs  ?Lab 10/13/21 ?1555 10/14/21 ?0257 10/14/21 ?1026 10/15/21 ?0257  ?AST 16 15 18 16   ?ALT 17 18 17 18   ?ALKPHOS 75 71 71 69  ?BILITOT 0.7 0.8 1.0 0.5  ?PROT 6.9 6.2* 5.9* 6.0*  ?ALBUMIN 4.0 3.0* 3.2* 3.0*  ? ?No results for  input(s): LIPASE, AMYLASE in the last 168 hours. ?No results for input(s): AMMONIA in the last 168 hours. ? ?ABG ?   ?Component Value Date/Time  ? PHART 7.15 (LL) 10/16/2021 0245  ? PCO2ART 66 ([place]) 10/16/2021 0245  ? PO2ART 62 (L) 10/16/2021 0245  ? HCO3 23.1 10/16/2021 0245  ? TCO2 15 (L) 05/17/2021 0622  ? ACIDBASEDEF 7.1 (H) 10/16/2021 0245  ? O2SAT 90.5 10/16/2021 0245  ?  ? ?Coagulation Profile: ?No results for input(s): INR, PROTIME in the last 168 hours. ? ?Cardiac Enzymes: ?No results for input(s): CKTOTAL, CKMB, CKMBINDEX, TROPONINI in the last 168 hours. ? ?HbA1C: ?Hgb A1c MFr Bld  ?Date/Time Value Ref Range Status  ?10/15/2021 10:55 AM 7.4 (H) 4.8 - 5.6 % Final  ?  Comment:  ?  (NOTE) ?Pre diabetes:          5.7%-6.4% ? ?Diabetes:              >  6.4% ? ?Glycemic control for   <7.0% ?adults with diabetes ?  ?07/10/2020 05:57 AM 6.3 (H) 4.8 - 5.6 % Final  ?  Comment:  ?  (NOTE) ?Pre diabetes:          5.7%-6.4% ? ?Diabetes:              >6.4% ? ?Glycemic control for   <7.0% ?adults with diabetes ?  ? ? ?CBG: ?Recent Labs  ?Lab 10/14/21 ?2054 10/15/21 ?0753 10/15/21 ?1535 10/15/21 ?2108 10/16/21 ?0220  ?GLUCAP 232* 167* 135* 194* 162*  ? ? ?Review of Systems:   ?Unable to obtain review of systems due to patient status ? ?Past Medical History:  ?He,  has a past medical history of Arthritis, Bell's palsy, CKD (chronic kidney disease), Diabetes ([place]), Hypercholesteremia, Hypertension, Myocardial infarction ([place]) (1990), Neuromuscular disorder ([place]), and PONV (postoperative nausea and vomiting).  ? ?Surgical History:  ? ?Past Surgical History:  ?Procedure Laterality Date  ? AV FISTULA PLACEMENT Left 05/17/2021  ? Procedure: LEFT ARM ARTERIOVENOUS (AV) FISTULA CREATION;  Surgeon: Broadus John, MD;  Location: [facility] OR;  Service: Vascular;  Laterality: Left;  PERIPHERAL NERVE BLOCK  ? BACK SURGERY  yrs ago  ? lower  ? CARDIAC CATHETERIZATION    ? CATARACT EXTRACTION Right 01/2021  ? CHOLECYSTECTOMY N/A 05/02/2017   ? Procedure: LAPAROSCOPIC CHOLECYSTECTOMY;  Surgeon: Ralene Ok, MD;  Location: [facility];  Service: General;  Laterality: N/A;  ? CORONARY ANGIOPLASTY    ? 2 1990  ? EYE SURGERY Left 03/2021  ? cataract removal

## 2021-10-16 NOTE — Progress Notes (Signed)
? ?NAME:  Robert Lucas, MRN:  [id number], DOB:  09/13/1940, LOS: 3 ?ADMISSION DATE:  10/13/2021, CONSULTATION DATE:  04/16 ?REFERRING MD:  Alcario Drought, CHIEF COMPLAINT:  Hypoxia  ? ?History of Present Illness:  ?Patient is encephalopathic and/or intubated. Therefore history has been obtained from chart review.  ?  ?Robert Lucas, is a 81 y.o. male, who was admitted on 4/13 after increasing shortness of breath over a few weeks.  He was found to have worsening creatinine with worsening volume overload. He was admitted to the hospitalist service ?  ?They have a pertinent past medical history of CKD stage V, HFrEF (02/23 EF 30-35%), CAD, HTN, DM 2, OSA/OHS, obesity ?  ?The morning of 4/16 called to the bedside for hypoxia and altered mental status.  Patient received Percocet around 2200.  Patient working with nurse, vomited, patient aspirated around 2 AM on 4/16.  Went from no oxygen requirement to nonrebreather.  Patient minimally responsive to pain and nonverbal. ABG 7.15/66/62/23.  Emergently transferred to the ICU and intubated. ?  ?PCCM assumed care. ?Pertinent  Medical History  ?CKD stage V, HFrEF (02/23 EF 30-35%), CAD, HTN, DM 2, OSA/OHS, obesity ?Significant Hospital Events: ?Including procedures, antibiotic start and stop dates in addition to other pertinent events   ?04/13-Admitted to hospital for respiratory distress 2/2 to volume overload.  ?04/15-HD catheter placed. HD done with 1000 removed.  ?04/16-Intubated ?Interim History / Subjective:  ?Intubated and sedated.  ?Review of Systems:   ?Negative unless stated in the subjective. ?Objective   ?Blood pressure (!) 168/69, pulse (!) 56, temperature (!) 94.9 ?F (34.9 ?C), temperature source Rectal, resp. rate 20, height 5\' 3"  (1.6 m), weight 75.1 kg, SpO2 97 %. ?   ?Vent Mode: PRVC ?FiO2 (%):  [40 %-100 %] 40 % ?Set Rate:  [20 bmp] 20 bmp ?Vt Set:  [450 mL] 450 mL ?PEEP:  [5 cmH20] 5 cmH20 ?Plateau Pressure:  [18 cmH20] 18 cmH20  ? ?Intake/Output Summary (Last  24 hours) at 10/16/2021 0742 ?Last data filed at 10/16/2021 0600 ?Gross per 24 hour  ?Intake 568.09 ml  ?Output 1475 ml  ?Net -906.91 ml  ? ?Filed Weights  ? 10/15/21 1444 10/15/21 2002 10/16/21 0332  ?Weight: 79.1 kg 76.6 kg 75.1 kg  ? ?Examination: ?General: NAD, sedated, laying in bed. ?HENT: intubated, NCAT, small lesion on bridge of nose ?Lungs: NCAT ?Cardiovascular: NSR, bradycardic, 2+ pulses in all extremities ?Abdomen: soft, normal bowel sounds ?Extremities: no asymmetry, LUE fistula with thrill present ?Neuro: sedated; unable to fully assess ?GU: suction catheter in place ?Consults  ? ?Resolved Hospital Problem list   ? ?Assessment & Plan:  ?Acute respiratory failure with hypoxia and hypercapnia secondary to aspiration event ?Acute Metabolic Encephalopathy ?Now intubated after vomiting episode at 0200 am. ABG showed pH of 7.15. -TXR to ICU ?-Goal plateau pressures less than 30 and driving pressures less than 15 ?-Wean PEEP/FiO2 for SpO2 92-98% ?-VAP bundle ?-Daily SAT and SBT ?-PAD bundle with Precedex gtt and fentanyl push ?-RASS goal 0 to -1 ?-Follow up CXR and ABG ?-Continue ceftriaxone and azithromycin ?Bradycardia ?Hypomagnesemia ?Now in NSR. Believe secondary to aspiration event vs sedation. Goal K above 4, goal MG above 2. K at 3.6 this am and Mg at 1.6.  ?-cont tele ?-Replete for goal of K of 4 and Mg 2. Will replete both today. ?Acute AKI on CKD stage V ?Normocytic anemia ?New Vas-Cath in place, last HD 4/15 removed 1000 cc.  Patient reportedly making urine. Anemia most likely  secondary to CKDIV.  ?-Nephrology following ?-Follow BMP ?-On oral bicarbonate ?-Transfuse PRBC if HBG less than 7 ?-Obtain AM CBC to trend H&H ?-Monitor for signs of bleeding ? Acute on chronic systolic CHF ?February 2023 EF 30-35% with grade 2 diastolic dysfunction ?-CTM fluid status, strict I/Os. ?-Treat HTN and DM2 as below.  ?CAD ?Hypertension ?-Home antihypertensives (Toprol, Norvasc, Isordil, and hydralazine, and Lasix)  held. BP doing well MAP greater than 90. Patient given PRN hydralazine 10 mg this am. Will restart slowly as needed.  ?-Continue home lipitor ?-Continuous telemetry ?-asa 81 ?DM2 ?-Blood Glucose goal 140-180. Patient has been over goal. Will restart home insulin vs semglee along with SSI.  ?-SSI ?Thrombocytopenia ?Platelets 105> 96> 85.  Etiology unclear at this time but may be secondary to decreased renal function and his uremia.  ?-Monitor on a.m. labs ?-SCD ?Best Practice (right click and "Reselect all SmartList Selections" daily)  ?Diet/type: NPO w/ oral meds ?DVT prophylaxis: SCD ?GI prophylaxis: PPI ?Lines: N/A ?Foley:  N/A ?Continuous:   ?Code Status:  full code ?Last date of multidisciplinary goals of care discussion [04/16 update to wife] ?Labs   ?CBC: ?Recent Labs  ?Lab 10/13/21 ?1555 10/14/21 ?0257 10/14/21 ?1026 10/15/21 ?0257 10/16/21 ?0259 10/16/21 ?0422  ?WBC 5.4 4.8 4.7 5.2 5.0  --   ?NEUTROABS 4.3 3.8 3.8 4.0 3.6  --   ?HGB 8.8* 8.3* 8.3* 8.4* 8.7* 9.2*  ?HCT 27.0* 25.7* 25.7* 26.0* 27.5* 27.0*  ?MCV 95.7 97.0 97.7 98.1 98.2  --   ?PLT 138* 103* 105* 96* 85*  --   ? ?Basic Metabolic Panel: ?Recent Labs  ?Lab 10/13/21 ?1555 10/14/21 ?0257 10/14/21 ?1026 10/15/21 ?0257 10/16/21 ?0259 10/16/21 ?0422  ?NA 132* 132* 135 136 137 138  ?K 4.3 4.3 4.3 4.2 3.6 3.5  ?CL 100 104 105 107 104  --   ?CO2 16* 15* 15* 13* 20*  --   ?GLUCOSE 244* 183* 167* 202* 194*  --   ?BUN 118* 118* 121* 123* 73*  --   ?CREATININE 6.95* 7.17* 7.40* 7.51* 5.38*  --   ?CALCIUM 7.8* 7.3* 7.4* 7.3* 7.1*  --   ?MG  --   --  1.6* 1.7 1.6*  --   ?PHOS  --   --  10.4* 10.9* 7.7*  --   ? ?GFR: ?Estimated Creatinine Clearance: 9.6 mL/min (A) (by C-G formula based on SCr of 5.38 mg/dL (H)). ?Recent Labs  ?Lab 10/14/21 ?0257 10/14/21 ?0938 10/14/21 ?1026 10/15/21 ?0257 10/16/21 ?0259  ?WBC 4.8  --  4.7 5.2 5.0  ?LATICACIDVEN 0.7 0.6  --   --   --   ? ?Liver Function Tests: ?Recent Labs  ?Lab 10/13/21 ?1555 10/14/21 ?0257 10/14/21 ?1026  10/15/21 ?0257 10/16/21 ?0259  ?AST 16 15 18 16 21   ?ALT 17 18 17 18 17   ?ALKPHOS 75 71 71 69 73  ?BILITOT 0.7 0.8 1.0 0.5 0.6  ?PROT 6.9 6.2* 5.9* 6.0* 6.4*  ?ALBUMIN 4.0 3.0* 3.2* 3.0* 3.2*  ? ?No results for input(s): LIPASE, AMYLASE in the last 168 hours. ?No results for input(s): AMMONIA in the last 168 hours. ?ABG ?   ?Component Value Date/Time  ? PHART 7.321 (L) 10/16/2021 0422  ? PCO2ART 46.5 10/16/2021 0422  ? PO2ART 345 (H) 10/16/2021 0422  ? HCO3 24.0 10/16/2021 0422  ? TCO2 25 10/16/2021 0422  ? ACIDBASEDEF 2.0 10/16/2021 0422  ? O2SAT 100 10/16/2021 0422  ?  ?Coagulation Profile: ?No results for input(s): INR, PROTIME in the last 168  hours. ?Cardiac Enzymes: ?No results for input(s): CKTOTAL, CKMB, CKMBINDEX, TROPONINI in the last 168 hours. ?HbA1C: ?Hgb A1c MFr Bld  ?Date/Time Value Ref Range Status  ?10/15/2021 10:55 AM 7.4 (H) 4.8 - 5.6 % Final  ?  Comment:  ?  (NOTE) ?Pre diabetes:          5.7%-6.4% ? ?Diabetes:              >6.4% ? ?Glycemic control for   <7.0% ?adults with diabetes ?  ?07/10/2020 05:57 AM 6.3 (H) 4.8 - 5.6 % Final  ?  Comment:  ?  (NOTE) ?Pre diabetes:          5.7%-6.4% ? ?Diabetes:              >6.4% ? ?Glycemic control for   <7.0% ?adults with diabetes ?  ? ?CBG: ?Recent Labs  ?Lab 10/15/21 ?1535 10/15/21 ?2108 10/16/21 ?0220 10/16/21 ?1638 10/16/21 ?0739  ?GLUCAP 135* 194* 162* 211* 169*  ? ?Past Medical History:  ?He,  has a past medical history of Arthritis, Bell's palsy, CKD (chronic kidney disease), Diabetes ([place]), Hypercholesteremia, Hypertension, Myocardial infarction ([place]) (1990), Neuromuscular disorder ([place]), and PONV (postoperative nausea and vomiting).  ?Surgical History:  ? ?Past Surgical History:  ?Procedure Laterality Date  ? AV FISTULA PLACEMENT Left 05/17/2021  ? Procedure: LEFT ARM ARTERIOVENOUS (AV) FISTULA CREATION;  Surgeon: Broadus John, MD;  Location: [facility] OR;  Service: Vascular;  Laterality: Left;  PERIPHERAL NERVE BLOCK  ? BACK SURGERY  yrs ago  ?  lower  ? CARDIAC CATHETERIZATION    ? CATARACT EXTRACTION Right 01/2021  ? CHOLECYSTECTOMY N/A 05/02/2017  ? Procedure: LAPAROSCOPIC CHOLECYSTECTOMY;  Surgeon: Ralene Ok, MD;  Location: [place];  Lewanda Rife

## 2021-10-16 NOTE — Progress Notes (Signed)
2038 - Patient c/o right upper chest pain - 8/10 - achiness - at the site of Rt DL IJ Tunneled catheter placed 10/15/2021.  Tylenol 650 mg given at 2038 with no relief. ? ?2130 - Dr. Alcario Drought paged.  Order received and 1 Percocet was given at 2157.  Pain level now a 3/10.  Patient A & O x 4 and sitting on side of bed.  He is drinking diet sprite and nibbling on graham crackers. ? ?0100 - Upon rounding on patient, it is noted that he is laying on his side.  He has had a loose BM.  Patient given bath and made comfortable.  He proceeded to have 2 additional loose incontinent stools.  Patient is cooperative and awake.  He does have a nagging cough with no phlegm.   ? ?60 - Dr. Alcario Drought called.  GI Panel is negative.  Asked for and received order for Imodium and Robitussin. ? ?0202 - Patient is sitting on the side of the bed.  He has begun to vomit.  Dr. Alcario Drought paged and made aware.  Order for Zofran given at 02/08.  Patient assisted back to bed and made comfortable. ? ?I proceeded to get dinamap and upon entering the room, noted that patient was purple in the face, non responsive, grunting, and would not awaken to sternal rub.  VS showed 81% on RA.  Placed on [place] with minimal results.  Rapid Response called to BS.  Patient placed on NRB at 15 LPM and O2 was 98%.  Dr. Alcario Drought paged by RRN and came to bedside.  Patient transferred to 9M for further care.  Earleen Reaper RN ?

## 2021-10-16 NOTE — Significant Event (Deleted)
EKG reviewed.  Shows S.Brady with rate of 56.  Going to leave this alone for the moment since it doesn't seem to be symptomatic. ?

## 2021-10-16 NOTE — Significant Event (Signed)
Care Contact Phone Call ? ?Called Robert Lucas's spouse, Robert Lucas, and Daughter Robert Lucas, at [phone]. They were updated on Robert Lucas's clinical status and plan. ? ?[employer]., MSN, APRN, AGACNP-BC ?[facility]  ?10/16/2021 , 3:58 AM ? ?Please see [email or web address] for pager details ? ?If no response, please call [phone] ?After hours, please call Elink at [phone] ? ?

## 2021-10-16 NOTE — Progress Notes (Addendum)
eLink Physician-Brief Progress Note ?Patient Name: Robert Lucas ?DOB: 1940/09/01 ?MRN: [id number] ? ? ?Date of Service ? 10/16/2021  ?HPI/Events of Note ? Brief new admit note: ?Shifted from ward/floor post percocet; syncope-vomited ( do not know which one first) and aspirated. Getting intubated in ICU now. ?CCM team at bed side. ?81 y.o. WM PMHx chronic systolic and diastolic CHF last EF measured in February 2023 was 30 to 35% with grade 2 diastolic dysfunction, CAD S/P PTCA, CKD stage V, OSA/OHS ? ? ?Camera: ? ?Intubated at first attempt. ?VS post intubation HR 100, sats 100%. MAP around 60 to 65. ? ? ?Data: ?Reviewed ?7.15/66/62 ?CxR film see: bi i basal air space. Rt HD cath in place at rt atrium inlet.  ? ? ?AHRF post Vaso vagal syncope Vomiting. Intubated, Encephalopathy.  ?Acute on chronic systolic CHF. EF 35%. HTN.  ?Acute on ckd. Nephro on board, on diuresis. Vas cath then HD is the plan ?DM ? ?- agree with NP H and P/consult.   ?eICU Interventions ? - follow CxR, ABG post intubation ?- keep MAP > 65 ?- Nephrology-HD ?- follow labs ?- CBC goals < 180. On ssi ?- SUP. SCD.  ?Lung protective ventilation. On abx. Follow cultures. ?  ? ? ? ?Intervention Category ?Major Interventions: Respiratory failure - evaluation and management ?Evaluation Type: New Patient Evaluation ? ?Elmer Sow ?10/16/2021, 3:29 AM ? ?5:08 ?Mag at 1.6, 1gm ordered ?ABG improving resp acidosis. Wean fio2 as tolerated ? ? ?

## 2021-10-16 NOTE — Progress Notes (Signed)
Hospitalist night time floor coverage note: ?Pt seen and evaluated at bedside. ? ?Had some diarrhea this evening and earlier in day (though c.diff and GI pathogen pnl were neg).  Had ordered some immodium for patient though this hadnt been given yet. ? ?Pt also had some pain at his catheter surgical site earlier in shift and so was given 1 tab of 5mg  percocet at around 7pm. ? ?Pt was supposed to have started CPAP this shift, thankfully (in hindsight) pt said he never used CPAP at home and this was not started. ? ?At around 0202, Per RN patient had been sitting up on side of bed, fully AAOx4, talking to her.  Had nausea, RN paged me for zofran order.  I put in zofran order.  RN left room to get zofran.  When she came back pt had vomited, had syncope, and had apparently aspirated with O2 sats of 60 on RA.  Pt was bradycardic on re-entry into room. ? ?NRB placed, O2 sat has improved to mid-low 90s.  However pt with ongoing AMS. ? ?CXR showing new bibasilar infiltrates. ? ?ABG showing acute respiratory acidosis: ?ABG ?   ?Component Value Date/Time  ? PHART 7.15 (LL) 10/16/2021 0245  ? PCO2ART 66 ([place]) 10/16/2021 0245  ? PO2ART 62 (L) 10/16/2021 0245  ? HCO3 23.1 10/16/2021 0245  ? TCO2 15 (L) 05/17/2021 0622  ? ACIDBASEDEF 7.1 (H) 10/16/2021 0245  ? O2SAT 90.5 10/16/2021 0245  ? ?Assessment: ?1) Believe patient had vomiting with syncope and aspiration (suspicious he may have had vagal episode with vomiting causing bradycardia observed by RN and syncope). ?2) believe aspiration is resulting in his severe acute hypoxic and hypercapnic respiratory failure. ?3) believe hypercapnic respiratory failure and acidosis is driving his new AMS ? ?Am concerned he is at risk for further vomiting and aspiration, potentially worsening his pulmonary status. ? ?Plan: ?PCCM consulted, pt transferred to ICU. ? ?CRITICAL CARE ?Performed by: Etta Quill. ? ? ?Total critical care time: 70 minutes ? ?Critical care time was exclusive of  separately billable procedures and treating other patients. ? ?Critical care was necessary to treat or prevent imminent or life-threatening deterioration. ? ?Critical care was time spent personally by me on the following activities: development of treatment plan with patient and/or surrogate as well as nursing, discussions with consultants, evaluation of patient's response to treatment, examination of patient, obtaining history from patient or surrogate, ordering and performing treatments and interventions, ordering and review of laboratory studies, ordering and review of radiographic studies, pulse oximetry and re-evaluation of patient's condition. ? ?

## 2021-10-16 NOTE — Progress Notes (Signed)
Called and informed wife and daughter that patient has been extubated.  Placed phone on speaker and let them talk for a few min.  After call patient started to dry heave.  PRN zofran given at this time.  Will continue to monitor. ?

## 2021-10-16 NOTE — Progress Notes (Signed)
VASCULAR SURGERY: ? ?The events of yesterday are noted.  It sounds like he aspirated.  He has a functioning dialysis catheter.  We will hold off on a fistulogram until he is clinically stable.  If his fistula cannot be salvaged ultimately he will need an AV graft. ? ?Gae Gallop, MD ?7:06 AM ? ?

## 2021-10-16 NOTE — Significant Event (Signed)
Rapid Response Event Note  ? ?Reason for Call :  ?Decreased LOC, hypoxia-60% on RA. ? ?Per RN, pt was given zofran for vomiting.  He was alert and oriented at that time. RN went out of room to get dinamap and when returned, pt was purple. SpO2-60% on RA. ? ?Initial Focused Assessment:  ?Pt lying in bed with eyes open, lips are blue, Sats-60% on 5L [place]. Pt belly breathing. Pt is minimally responsive to voice, will open eyes, goes back to sleep very easily. Lungs rhonchus t/o. ABD large, distended. Skin hot to touch.  ? ?T-97.4, HR-98, BP-117 74, RR-24, SpO2-60% on 5L [place].  ? ?Pt placed on NRB with SpO2 increasing to 93% and pt able to open eyes, move all extremities, and follow commands. Pt still very sleepy.  ? ?Pt began to dry heave with HR dropping to 30s but recovering quickly.  ? ?Interventions:  ?NRB ?PCXR-Development of bibasilar pulmonary infiltrate, infection versus edema. Small bilateral associated pleural effusions. ?CBG-162 ?ABG-7.15/66/62/23.1 ?CBC ?BMP ?EKG ?PCCM consulted: ?Tx to ICU ?Plan of Care:  ?Pt transferred to 2M06. Pt mental status declined once in ICU and pt was intubated.  ? ?Event Summary:  ? ?MD Notified: Dr. Alcario Drought notified and came to bedside ?Call [phone] ?Arrival IPPN:[id number] ?End Time:0340 ? ?Dillard Essex, RN ?

## 2021-10-16 NOTE — Progress Notes (Signed)
Nephrology Follow-Up Consult note ? ? ?Assessment/Recommendations: Robert Lucas is a/an 81 y.o. male with a past medical history significant for chronic systolic and diastolic HF (EF 17-51%, W2HE), CAD s/p PTCA, HTN, T2DM, CKD V who presents with SOB and volume overload. ?  ?New ESRD, Uremia: Presenting with volume overload as well as uremia now deemed ESRD ?-HD catheter placed on 4/15 with IR, appreciate help ?-Plan for another dialysis session today to help with volume removal ?-Third dialysis session tomorrow ?-AVF is not ready to use.  VVS following, likely fistulogram once the patient is more stable.  May have to convert to AVG.  Appreciate help ?-Renally dose medications ?-Strict I/Os , Daily weights  ?-Maintain MAP>65 for optimal renal perfusion.  ?-Avoid nephrotoxic medications including NSAIDs ?-Use synthetic opioids (Fentanyl/Dilaudid) if needed ? ?Acute hypoxic/hypercapnic respiratory failure: Some pulmonary edema but likely aspiration contributed significantly.  Likely pneumonitis.  Ventilation per primary team.  Dialysis today for volume removal ?  ?Metabolic Acidosis - improving with hemodialysis.  Stop oral bicarbonate ?  ?Suspected CAP: Now with possible aspiration component.  Antibiotics per primary team ?  ?Anemia due to CKD: Also with iron deficiency.  Holding IV iron due to concerns for infection.  Hemoglobin most recently 9.2.  Consider starting ESA ?-Transfuse for Hgb<7 g/dL ?  ?Chronic systolic and diastolic HF - EF 52-77%, O2UM on most recent ECHO. Volume overloaded, but expect improvement with HD. Can slowly restart GDMT as tolerated ?  ?Hypertension: ?-BP intermittently low-normal ?-hold home antihypertensives today to assess response to HD ?  ?Diabetes Mellitus Type 2 with Hyperglycemia: ?-Management per primary ? ?Secondary hyperparathyroidism: On calcitriol.  Started on sevelamer ? ? ?Recommendations conveyed to primary service.  ? ? ?Reesa Chew ?[facility] ?10/16/2021 ?8:56 AM ? ?___________________________________________________________ ? ?CC: Swelling ? ?Interval History/Subjective: Significant events over the past 24 hours.  The patient underwent dialysis and seemed to tolerate it fairly well.  Afterwards he had issues with nausea and what sounds like aspiration then nearly loss of consciousness.  Does not sound like he had a cardiac arrest.  Ultimately the patient was intubated and at this time is sedated. ? ? ?Medications:  ?Current Facility-Administered Medications  ?Medication Dose Route Frequency Provider Last Rate Last Admin  ? 0.9 %  sodium chloride infusion  100 mL Intravenous PRN Reesa Chew, MD      ? 0.9 %  sodium chloride infusion  100 mL Intravenous PRN Reesa Chew, MD      ? acetaminophen (TYLENOL) tablet 650 mg  650 mg Oral Q6H PRN Rise Patience, MD   650 mg at 10/15/21 2038  ? Or  ? acetaminophen (TYLENOL) suppository 650 mg  650 mg Rectal Q6H PRN Rise Patience, MD      ? allopurinol (ZYLOPRIM) tablet 100 mg  100 mg Oral Daily Rise Patience, MD   100 mg at 10/15/21 1748  ? alteplase (CATHFLO ACTIVASE) injection 2 mg  2 mg Intracatheter Once PRN Reesa Chew, MD      ? aspirin EC tablet 81 mg  81 mg Oral QPM Rise Patience, MD   81 mg at 10/15/21 1748  ? atorvastatin (LIPITOR) tablet 40 mg  40 mg Oral QHS Rise Patience, MD   40 mg at 10/15/21 2157  ? azithromycin (ZITHROMAX) 500 mg in sodium chloride 0.9 % 250 mL IVPB  500 mg Intravenous Q24H Rise Patience, MD   Stopped at 10/16/21 0256  ?  calcitRIOL (ROCALTROL) capsule 0.25 mcg  0.25 mcg Oral Q M,W,F Rise Patience, MD   0.25 mcg at 10/14/21 1049  ? cefTRIAXone (ROCEPHIN) 2 g in sodium chloride 0.9 % 100 mL IVPB  2 g Intravenous Q24H Rise Patience, MD 200 mL/hr at 10/16/21 0110 2 g at 10/16/21 0110  ? Chlorhexidine Gluconate Cloth 2 % PADS 6 each  6 each Topical Q0600 Reesa Chew, MD   6 each at 10/15/21 0554  ?  dexmedetomidine (PRECEDEX) 400 MCG/100ML (4 mcg/mL) infusion  0-1.2 mcg/kg/hr Intravenous Continuous Estill Cotta, NP 7.66 mL/hr at 10/16/21 0518 0.4 mcg/kg/hr at 10/16/21 0518  ? docusate (COLACE) 50 MG/5ML liquid 100 mg  100 mg Per Tube BID Estill Cotta, NP      ? fentaNYL (SUBLIMAZE) injection 25-50 mcg  25-50 mcg Intravenous Q30 min PRN Estill Cotta, NP      ? finasteride (PROSCAR) tablet 5 mg  5 mg Oral QPM Rise Patience, MD   5 mg at 10/15/21 1748  ? guaiFENesin-dextromethorphan (ROBITUSSIN DM) 100-10 MG/5ML syrup 5 mL  5 mL Oral Q4H PRN Etta Quill, DO      ? heparin injection 1,000 Units  1,000 Units Dialysis PRN Reesa Chew, MD      ? heparin injection 5,000 Units  5,000 Units Subcutaneous Q8H Reesa Chew, MD   5,000 Units at 10/15/21 2157  ? hydrALAZINE (APRESOLINE) injection 10 mg  10 mg Intravenous Q4H PRN Rise Patience, MD   10 mg at 10/16/21 4034  ? insulin aspart (novoLOG) injection 0-6 Units  0-6 Units Subcutaneous TID WC Rise Patience, MD   1 Units at 10/16/21 7425  ? loperamide (IMODIUM) capsule 2 mg  2 mg Oral PRN Etta Quill, DO      ? midazolam (VERSED) 2 MG/2ML injection           ? ondansetron (ZOFRAN) injection 4 mg  4 mg Intravenous Q6H PRN Etta Quill, DO   4 mg at 10/16/21 0208  ? oxyCODONE-acetaminophen (PERCOCET/ROXICET) 5-325 MG per tablet 1-2 tablet  1-2 tablet Oral Q6H PRN Etta Quill, DO   1 tablet at 10/15/21 2157  ? pantoprazole (PROTONIX) injection 40 mg  40 mg Intravenous Daily Estill Cotta, NP      ? pentafluoroprop-tetrafluoroeth (GEBAUERS) aerosol 1 application.  1 application. Topical PRN Reesa Chew, MD      ? polyethylene glycol (MIRALAX / GLYCOLAX) packet 17 g  17 g Per Tube Daily Estill Cotta, NP      ? sevelamer carbonate (RENVELA) tablet 800 mg  800 mg Oral TID WC Reesa Chew, MD   800 mg at 10/15/21 1748  ? sodium bicarbonate tablet 1,300 mg  1,300 mg Oral BID Reesa Chew,  MD   1,300 mg at 10/15/21 2157  ? tamsulosin (FLOMAX) capsule 0.4 mg  0.4 mg Oral QHS Rise Patience, MD   0.4 mg at 10/15/21 2157  ?  ? ? ?Review of Systems: ?10 systems reviewed and negative except per interval history/subjective ? ?Physical Exam: ?Vitals:  ? 10/16/21 0739 10/16/21 0740  ?BP:    ?Pulse: (!) 56   ?Resp: 20   ?Temp:  (!) 97.5 ?F (36.4 ?C)  ?SpO2: 97%   ? ?No intake/output data recorded. ? ?Intake/Output Summary (Last 24 hours) at 10/16/2021 0856 ?Last data filed at 10/16/2021 0600 ?Gross per 24 hour  ?Intake 568.09 ml  ?Output 1475 ml  ?  Net -906.91 ml  ? ?Constitutional: Ill-appearing, lying in bed, sedated ?ENMT: ears and nose without scars or lesions, MMM ?CV: normal rate, 1+ pitting edema in the bilateral lower extremities ?Respiratory: Coarse breath sounds in the bilateral anterior lung fields, ventilated, bilateral chest rise ?Gastrointestinal: soft, non-tender, minimal distention ?Skin: no visible lesions or rashes ?Psych: Sedated, not interactive, does not answer questioning ? ? ?Test Results ?I personally reviewed new and old clinical labs and radiology tests ?Lab Results  ?Component Value Date  ? NA 138 10/16/2021  ? K 3.5 10/16/2021  ? CL 104 10/16/2021  ? CO2 20 (L) 10/16/2021  ? BUN 73 (H) 10/16/2021  ? CREATININE 5.38 (H) 10/16/2021  ? CALCIUM 7.1 (L) 10/16/2021  ? ALBUMIN 3.2 (L) 10/16/2021  ? PHOS 7.7 (H) 10/16/2021  ? ? ?CBC ?Recent Labs  ?Lab 10/14/21 ?1026 10/15/21 ?0257 10/16/21 ?0259 10/16/21 ?0422  ?WBC 4.7 5.2 5.0  --   ?NEUTROABS 3.8 4.0 3.6  --   ?HGB 8.3* 8.4* 8.7* 9.2*  ?HCT 25.7* 26.0* 27.5* 27.0*  ?MCV 97.7 98.1 98.2  --   ?PLT 105* 96* 85*  --   ? ? ? ? ? ?

## 2021-10-16 NOTE — Procedures (Signed)
Extubation Procedure Note ? ?Patient Details:   ?Name: Robert Lucas ?DOB: Mar 20, 1941 ?MRN: [id number] ?  ?Airway Documentation:  ?  ?Vent end date: 10/16/21 Vent end time: 1626  ? ?Evaluation ? O2 sats: stable throughout ?Complications: No apparent complications ?Patient did tolerate procedure well. ?Bilateral Breath Sounds: Clear ?  ?Yes ?Pt placed on 2lpm Tool  ? ?Cordella Register ?10/16/2021, 4:27 PM ? ?

## 2021-10-17 ENCOUNTER — Encounter (HOSPITAL_COMMUNITY): Payer: Self-pay

## 2021-10-17 DIAGNOSIS — E8770 Fluid overload, unspecified: Secondary | ICD-10-CM | POA: Diagnosis not present

## 2021-10-17 LAB — COMPREHENSIVE METABOLIC PANEL
ALT: 17 U/L (ref 0–44)
AST: 22 U/L (ref 15–41)
Albumin: 3 g/dL — ABNORMAL LOW (ref 3.5–5.0)
Alkaline Phosphatase: 72 U/L (ref 38–126)
Anion gap: 11 (ref 5–15)
BUN: 39 mg/dL — ABNORMAL HIGH (ref 8–23)
CO2: 25 mmol/L (ref 22–32)
Calcium: 7.6 mg/dL — ABNORMAL LOW (ref 8.9–10.3)
Chloride: 103 mmol/L (ref 98–111)
Creatinine, Ser: 3.89 mg/dL — ABNORMAL HIGH (ref 0.61–1.24)
GFR, Estimated: 15 mL/min — ABNORMAL LOW (ref >60)
Glucose, Bld: 102 mg/dL — ABNORMAL HIGH (ref 70–99)
Potassium: 3.5 mmol/L (ref 3.5–5.1)
Sodium: 139 mmol/L (ref 135–145)
Total Bilirubin: 1 mg/dL (ref 0.3–1.2)
Total Protein: 6 g/dL — ABNORMAL LOW (ref 6.5–8.1)

## 2021-10-17 LAB — MAGNESIUM: Magnesium: 1.8 mg/dL (ref 1.7–2.4)

## 2021-10-17 LAB — GLUCOSE, CAPILLARY
Glucose-Capillary: 101 mg/dL — ABNORMAL HIGH (ref 70–99)
Glucose-Capillary: 87 mg/dL (ref 70–99)
Glucose-Capillary: 170 mg/dL — ABNORMAL HIGH (ref 70–99)
Glucose-Capillary: 208 mg/dL — ABNORMAL HIGH (ref 70–99)
Glucose-Capillary: 81 mg/dL (ref 70–99)

## 2021-10-17 LAB — CBC WITH DIFFERENTIAL/PLATELET
Abs Immature Granulocytes: 0.02 10*3/uL (ref 0.00–0.07)
Basophils Absolute: 0 10*3/uL (ref 0.0–0.1)
Basophils Relative: 0 %
Eosinophils Absolute: 0.1 10*3/uL (ref 0.0–0.5)
Eosinophils Relative: 1 %
HCT: 27.9 % — ABNORMAL LOW (ref 39.0–52.0)
Hemoglobin: 9.1 g/dL — ABNORMAL LOW (ref 13.0–17.0)
Immature Granulocytes: 0 %
Lymphocytes Relative: 7 %
Lymphs Abs: 0.5 10*3/uL — ABNORMAL LOW (ref 0.7–4.0)
MCH: 31.3 pg (ref 26.0–34.0)
MCHC: 32.6 g/dL (ref 30.0–36.0)
MCV: 95.9 fL (ref 80.0–100.0)
Monocytes Absolute: 0.7 10*3/uL (ref 0.1–1.0)
Monocytes Relative: 11 %
Neutro Abs: 5.3 10*3/uL (ref 1.7–7.7)
Neutrophils Relative %: 81 %
Platelets: 83 10*3/uL — ABNORMAL LOW (ref 150–400)
RBC: 2.91 MIL/uL — ABNORMAL LOW (ref 4.22–5.81)
RDW: 17.2 % — ABNORMAL HIGH (ref 11.5–15.5)
WBC: 6.7 10*3/uL (ref 4.0–10.5)
nRBC: 0 % (ref 0.0–0.2)

## 2021-10-17 LAB — HEPATITIS B E ANTIBODY: Hep B E Ab: NEGATIVE

## 2021-10-17 LAB — PHOSPHORUS: Phosphorus: 5.7 mg/dL — ABNORMAL HIGH (ref 2.5–4.6)

## 2021-10-17 MED ORDER — ACETAMINOPHEN 650 MG RE SUPP: 650.0000 mg | Freq: Four times a day (QID) | RECTAL | Status: DC | PRN

## 2021-10-17 MED ORDER — GUAIFENESIN-DM 100-10 MG/5ML PO SYRP
5.0000 mL | ORAL_SOLUTION | ORAL | Status: DC | PRN
  Administered 2021-10-17 – 2021-10-18 (×3): 5 mL via ORAL
  Filled 2021-10-17 (×3): qty 5

## 2021-10-17 MED ORDER — ACETAMINOPHEN 325 MG PO TABS: 650.0000 mg | ORAL_TABLET | Freq: Four times a day (QID) | ORAL | Status: DC | PRN

## 2021-10-17 MED ORDER — POLYETHYLENE GLYCOL 3350 17 G PO PACK: 17.0000 g | PACK | Freq: Every day | ORAL | Status: DC

## 2021-10-17 MED ORDER — LOPERAMIDE HCL 1 MG/7.5ML PO SUSP
2.0000 mg | ORAL | Status: DC | PRN
  Administered 2021-10-18: 2 mg via ORAL
  Filled 2021-10-17 (×3): qty 15

## 2021-10-17 MED ORDER — ASPIRIN 81 MG PO CHEW
81.0000 mg | CHEWABLE_TABLET | Freq: Every day | ORAL | Status: DC
  Administered 2021-10-17 – 2021-10-18 (×2): 81 mg via ORAL
  Filled 2021-10-17 (×3): qty 1

## 2021-10-17 MED ORDER — DOCUSATE SODIUM 50 MG/5ML PO LIQD: 100.0000 mg | Freq: Two times a day (BID) | ORAL | Status: DC

## 2021-10-17 MED ORDER — SEVELAMER CARBONATE 800 MG PO TABS
800.0000 mg | ORAL_TABLET | Freq: Three times a day (TID) | ORAL | Status: DC
  Administered 2021-10-17 – 2021-10-18 (×5): 800 mg via ORAL
  Filled 2021-10-17 (×6): qty 1

## 2021-10-17 MED ORDER — ATORVASTATIN CALCIUM 40 MG PO TABS
40.0000 mg | ORAL_TABLET | Freq: Every day | ORAL | Status: DC
  Administered 2021-10-17 – 2021-10-18 (×2): 40 mg via ORAL
  Filled 2021-10-17 (×2): qty 1

## 2021-10-17 MED ORDER — OXYCODONE-ACETAMINOPHEN 5-325 MG PO TABS: 1.0000 | ORAL_TABLET | Freq: Four times a day (QID) | ORAL | Status: DC | PRN

## 2021-10-17 MED ORDER — CALCITRIOL 0.25 MCG PO CAPS
0.2500 ug | ORAL_CAPSULE | ORAL | Status: DC
  Administered 2021-10-17 – 2021-10-19 (×2): 0.25 ug via ORAL
  Filled 2021-10-17 (×2): qty 1

## 2021-10-17 MED ORDER — ALLOPURINOL 100 MG PO TABS
100.0000 mg | ORAL_TABLET | Freq: Every day | ORAL | Status: DC
  Administered 2021-10-17 – 2021-10-19 (×3): 100 mg via ORAL
  Filled 2021-10-17 (×3): qty 1

## 2021-10-17 NOTE — Evaluation (Signed)
Physical Therapy Evaluation ?Patient Details ?Name: Robert Lucas ?MRN: [id number] ?DOB: 13-Jul-1940 ?Today's Date: 10/17/2021 ? ?History of Present Illness ? Pt is 81 yo male admitted on 10/13/21 with worsening creatinine, volume overload, resp distress. Pt intubated 4/16 around 200 am, extubated later that afternoon 1626.  Pt also requiring initiationing of HD.  Pt with hx of CKD stage V, HF, CAD, HTN, DM, OSA, obesity.  ?Clinical Impression ? Pt admitted with above diagnosis. At baseline, pt is independent without AD.  Today, pt reports some fatigue (sat earlier and nauseated) but was able to transfer with min guard and ambulated 8' with RW.  Pt expected to progress well.   Pt currently with functional limitations due to the deficits listed below (see PT Problem List). Pt will benefit from skilled PT to increase their independence and safety with mobility to allow discharge to the venue listed below.   ?   ?   ? ?Recommendations for follow up therapy are one component of a multi-disciplinary discharge planning process, led by the attending physician.  Recommendations may be updated based on patient status, additional functional criteria and insurance authorization. ? ?Follow Up Recommendations Home health PT ? ?  ?Assistance Recommended at Discharge PRN  ?Patient can return home with the following ? A little help with walking and/or transfers;A little help with bathing/dressing/bathroom;Help with stairs or ramp for entrance ? ?  ?Equipment Recommendations None recommended by PT  ?Recommendations for Other Services ?    ?  ?Functional Status Assessment Patient has had a recent decline in their functional status and demonstrates the ability to make significant improvements in function in a reasonable and predictable amount of time.  ? ?  ?Precautions / Restrictions Precautions ?Precautions: Fall  ? ?  ? ?Mobility ? Bed Mobility ?Overal bed mobility: Needs Assistance ?Bed Mobility: Supine to Sit ?  ?  ?Supine to sit:  Supervision, HOB elevated ?  ?  ?  ?  ? ?Transfers ?Overall transfer level: Needs assistance ?Equipment used: Rolling walker (2 wheels) ?Transfers: Sit to/from Stand ?Sit to Stand: Min guard ?  ?  ?  ?  ?  ?  ?  ? ?Ambulation/Gait ?Ambulation/Gait assistance: Min guard ?Gait Distance (Feet): 8 Feet ?Assistive device: Rolling walker (2 wheels) ?Gait Pattern/deviations: Step-through pattern, Decreased stride length ?Gait velocity: decreased ?  ?  ?General Gait Details: min guard to steady; pt reports not fatigued from sitting and nauseated earlier - did not want to walk futher ? ?Stairs ?  ?  ?  ?  ?  ? ?Wheelchair Mobility ?  ? ?Modified Rankin (Stroke Patients Only) ?  ? ?  ? ?Balance Overall balance assessment: Needs assistance ?Sitting-balance support: No upper extremity supported ?Sitting balance-Leahy Scale: Good ?  ?  ?Standing balance support: Bilateral upper extremity supported, No upper extremity supported ?Standing balance-Leahy Scale: Fair ?Standing balance comment: RW to ambulate but could static stand without support ?  ?  ?  ?  ?  ?  ?  ?  ?  ?  ?  ?   ? ? ? ?Pertinent Vitals/Pain Pain Assessment ?Pain Assessment: No/denies pain  ? ? ?Home Living Family/patient expects to be discharged to:: Private residence ?Living Arrangements: Spouse/significant other ?Available Help at Discharge: Family;Available 24 hours/day ?Type of Home: Independent living facility ?Home Access: Level entry ?  ?  ?  ?Home Layout: One level ?Home Equipment: Advice worker (2 wheels);Cane - single point;Grab bars - toilet;Grab bars - tub/shower;Hand held shower  head ?   ?  ?Prior Function Prior Level of Function : Independent/Modified Independent;Driving ?  ?  ?  ?  ?  ?  ?Mobility Comments: Does not use DME and can ambulate in community; started using RW this week ?ADLs Comments: Retired. Enjoys playing golf (though hasnt played in about a year). ADLs, IADLs, and driving ?  ? ? ?Hand Dominance  ? Dominant Hand:  Right ? ?  ?Extremity/Trunk Assessment  ? Upper Extremity Assessment ?Upper Extremity Assessment: Overall WFL for tasks assessed ?  ? ?Lower Extremity Assessment ?Lower Extremity Assessment: Overall WFL for tasks assessed (ROM WFL; MMT 5/5) ?  ? ?Cervical / Trunk Assessment ?Cervical / Trunk Assessment: Normal  ?Communication  ? Communication: No difficulties  ?Cognition Arousal/Alertness: Awake/alert ?Behavior During Therapy: [facility] for tasks assessed/performed ?Overall Cognitive Status: Within Functional Limits for tasks assessed ?  ?  ?  ?  ?  ?  ?  ?  ?  ?  ?  ?  ?  ?  ?  ?  ?  ?  ?  ? ?  ?General Comments General comments (skin integrity, edema, etc.): Pt was on 1 L O2 with sats 100%.  Tried RA and sats varied 88-96% rest and activity (improved with cues for deep breathing) ? ?  ?Exercises    ? ?Assessment/Plan  ?  ?PT Assessment Patient needs continued PT services  ?PT Problem List Decreased strength;Decreased mobility;Decreased activity tolerance;Decreased balance;Cardiopulmonary status limiting activity ? ?   ?  ?PT Treatment Interventions Therapeutic activities;DME instruction;Gait training;Therapeutic exercise;Patient/family education;Balance training;Functional mobility training;Stair training   ? ?PT Goals (Current goals can be found in the Care Plan section)  ?Acute Rehab PT Goals ?Patient Stated Goal: return home ?PT Goal Formulation: With patient ?Time For Goal Achievement: 10/31/21 ?Potential to Achieve Goals: Good ? ?  ?Frequency Min 3X/week ?  ? ? ?Co-evaluation   ?  ?  ?  ?  ? ? ?  ?AM-PAC PT "6 Clicks" Mobility  ?Outcome Measure Help needed turning from your back to your side while in a flat bed without using bedrails?: A Little ?Help needed moving from lying on your back to sitting on the side of a flat bed without using bedrails?: A Little ?Help needed moving to and from a bed to a chair (including a wheelchair)?: A Little ?Help needed standing up from a chair using your arms (e.g., wheelchair or  bedside chair)?: A Little ?Help needed to walk in hospital room?: A Little ?Help needed climbing 3-5 steps with a railing? : A Little ?6 Click Score: 18 ? ?  ?End of Session Equipment Utilized During Treatment: Gait belt ?Activity Tolerance: Patient tolerated treatment well ?Patient left: Other (comment) (in w/c for transport to another room ; family present) ?  ?PT Visit Diagnosis: Other abnormalities of gait and mobility (R26.89);Muscle weakness (generalized) (M62.81) ?  ? ?Time: 3491-7915 ?PT Time Calculation (min) (ACUTE ONLY): 15 min ? ? ?Charges:   PT Evaluation ?$PT Eval Low Complexity: 1 Low ?  ?  ?   ? ? ?Abran Richard, PT ?Acute Rehab Services ?Pager [phone] ?[facility] [phone] ? ? ?Mikael Spray Joaopedro Eschbach ?10/17/2021, 2:55 PM ? ?

## 2021-10-17 NOTE — Progress Notes (Signed)
Attending: ?  ? ?Subjective: ?81 y/o male with CKD stage 5 who presented on 4/13 with volume overload due to worsening dyspnea/volume overload. Intubated on 4/16 early AM for presumed aspiration pneumonia. Extubated yesterday morning. ?Doing fairly well since extubation, some cough.  ?Received HD again yesterday, 2L fluid removed ? ?Objective: ?Vitals:  ? 10/17/21 0500 10/17/21 0600 10/17/21 0700 10/17/21 0724  ?BP: (!) 148/56 139/67 139/66   ?Pulse: 79 76 79 78  ?Resp: 17 (!) 23 16 (!) 22  ?Temp:    98.9 ?F (37.2 ?C)  ?TempSrc:    Oral  ?SpO2: 99% 100% 96% 98%  ?Weight: 74.7 kg     ?Height:      ? ?Vent Mode: PSV;CPAP ?FiO2 (%):  [40 %] 40 % ?Set Rate:  [20 bmp] 20 bmp ?Vt Set:  [450 mL] 450 mL ?PEEP:  [5 cmH20] 5 cmH20 ?Pressure Support:  [5 cmH20] 5 cmH20 ?Plateau Pressure:  [21 cmH20] 21 cmH20 ? ?Intake/Output Summary (Last 24 hours) at 10/17/2021 0806 ?Last data filed at 10/17/2021 0300 ?Gross per 24 hour  ?Intake 392.4 ml  ?Output 2975 ml  ?Net -2582.6 ml  ? ? ?General:  Resting comfortably in bed ?HENT: NCAT OP clear ?PULM: crackles bases B, normal effort ?CV: RRR, no mgr ?GI: BS+, soft, nontender ?MSK: normal bulk and tone ?Neuro: awake, alert, no distress, MAEW ? ? ? ?CBC ?   ?Component Value Date/Time  ? WBC 6.7 10/17/2021 0050  ? RBC 2.91 (L) 10/17/2021 0050  ? HGB 9.1 (L) 10/17/2021 0050  ? HCT 27.9 (L) 10/17/2021 0050  ? PLT 83 (L) 10/17/2021 0050  ? MCV 95.9 10/17/2021 0050  ? MCH 31.3 10/17/2021 0050  ? MCHC 32.6 10/17/2021 0050  ? RDW 17.2 (H) 10/17/2021 0050  ? LYMPHSABS 0.5 (L) 10/17/2021 0050  ? MONOABS 0.7 10/17/2021 0050  ? EOSABS 0.1 10/17/2021 0050  ? BASOSABS 0.0 10/17/2021 0050  ? ? ?BMET ?   ?Component Value Date/Time  ? NA 139 10/17/2021 0050  ? K 3.5 10/17/2021 0050  ? CL 103 10/17/2021 0050  ? CO2 25 10/17/2021 0050  ? GLUCOSE 102 (H) 10/17/2021 0050  ? BUN 39 (H) 10/17/2021 0050  ? CREATININE 3.89 (H) 10/17/2021 0050  ? CALCIUM 7.6 (L) 10/17/2021 0050  ? CALCIUM 7.6 (L) 08/26/2021 0050  ?  GFRNONAA 15 (L) 10/17/2021 0050  ? GFRAA 26 (L) 05/07/2019 0234  ? ? ?CXR images reviewed, bilateral airspace disease ? ?Impression/Plan: ?Acute respiratory failure with hypoxemia due to aspiration pneumonitis, doubt pneumonia> O2 saturation improved, doing well after extubation, monitor o2 saturation, wean off for O2 sat > 88%; discontinue antibiotics today, monitor for fever ?Bradycardia> improved, precedex related; monitor off precedex ?AKI on CKD 5> now receiving HD per renal, monitor UOP, BMET.  Vascular following immature fistula; lasix per renal ?Combined systolic/diastolic heart failure, acute> tele, restart all home meds this morning, if passes [facility] swallow screen then can restart all his home medications at that  ?Thrombocytopenia> stable, no bleeding, continue DVT prophylaxis ? ?My cc time n/a minutes ? ?Roselie Awkward, MD ?[facility] ?Pager: [phone] ?Cell: [phone] ?After 7pm: [phone] ? ?

## 2021-10-17 NOTE — Progress Notes (Signed)
Nephrology Follow-Up Consult note ? ? ?Assessment/Recommendations: Robert Lucas is a/an 81 y.o. male with a past medical history significant for chronic systolic and diastolic HF (EF 38-10%, F7PZ), CAD s/p PTCA, HTN, T2DM, CKD V who presents with SOB and volume overload. ?  ?New ESRD, Uremia: Presenting with volume overload as well as uremia now deemed ESRD ?-HD catheter placed on 4/15 with IR, appreciate help ?-Plan for another dialysis session today to help with volume removal (third dialysis session); tolerated net UF 2L on 4/16. Then MWF regimen + CLIP. ?-AVF is not ready to use.  VVS following, likely fistulogram once the patient is more stable.  May have to convert to AVG.  Appreciate help ?-Renally dose medications for iHD ?-Strict I/Os , Daily weights  ?-Maintain MAP>65 for optimal renal perfusion.  ?-Avoid nephrotoxic medications including NSAIDs ?-Use synthetic opioids (Fentanyl/Dilaudid) if needed ? ?Acute hypoxic/hypercapnic respiratory failure: Some pulmonary edema but likely aspiration contributed significantly.  Likely pneumonitis.  Ventilation per primary team.  Dialysis today for volume removal ?  ?Metabolic Acidosis - improving with hemodialysis.  Stopped oral bicarbonate ?  ?Suspected CAP: Now with possible aspiration component.  Antibiotics per primary team ?  ?Anemia due to CKD: Also with iron deficiency.  Holding IV iron due to concerns for infection.  Hemoglobin most recently 9.2.  Consider starting ESA; may not respond with an infection/ inflammation. ?-Transfuse for Hgb<7 g/dL ?  ?Chronic systolic and diastolic HF - EF 02-58%, N2DP on most recent ECHO. Volume overloaded, but expect improvement with HD. Can slowly restart GDMT as tolerated given now on dialysis ?  ?Hypertension: ?-BP intermittently low-normal ?-hold home antihypertensives as needed (currently only on hydralazine PRN) ?  ?Diabetes Mellitus Type 2 with Hyperglycemia: ?-Management per primary ? ?Secondary hyperparathyroidism:  On calcitriol.  Started on sevelamer ? ? ?Recommendations conveyed to primary service.  ? ? ?Robert Lucas ?[facility] ?10/17/2021 ?12:07 PM ? ?___________________________________________________________ ? ?CC: Swelling ? ?Interval History/Subjective: Breathing much better after HD which he tolerated. Extubated now and comfortable appearing. Denies f/c/n/v/ dyspnea. ? ? ?Medications:  ?Current Facility-Administered Medications  ?Medication Dose Route Frequency Provider Last Rate Last Admin  ? 0.9 %  sodium chloride infusion  100 mL Intravenous PRN Reesa Chew, MD      ? 0.9 %  sodium chloride infusion  100 mL Intravenous PRN Reesa Chew, MD      ? acetaminophen (TYLENOL) tablet 650 mg  650 mg Oral Q6H PRN Pauletta Browns, RPH      ? Or  ? acetaminophen (TYLENOL) suppository 650 mg  650 mg Rectal Q6H PRN Pauletta Browns, RPH      ? allopurinol (ZYLOPRIM) tablet 100 mg  100 mg Oral Daily Pauletta Browns, RPH   100 mg at 10/17/21 1039  ? alteplase (CATHFLO ACTIVASE) injection 2 mg  2 mg Intracatheter Once PRN Reesa Chew, MD      ? aspirin chewable tablet 81 mg  81 mg Oral Daily Pauletta Browns, RPH   81 mg at 10/17/21 1039  ? atorvastatin (LIPITOR) tablet 40 mg  40 mg Oral QHS Pauletta Browns, [facility]      ? calcitRIOL (ROCALTROL) capsule 0.25 mcg  0.25 mcg Oral Q M,Lucas,F Pauletta Browns, RPH   0.25 mcg at 10/17/21 1039  ? Chlorhexidine Gluconate Cloth 2 % PADS 6 each  6 each Topical Q0600 Reesa Chew, MD   6 each at 10/15/21 0554  ? guaiFENesin-dextromethorphan (ROBITUSSIN  DM) 100-10 MG/5ML syrup 5 mL  5 mL Oral Q4H PRN Pauletta Browns, RPH   5 mL at 10/17/21 1040  ? heparin injection 1,000 Units  1,000 Units Dialysis PRN Reesa Chew, MD      ? heparin injection 5,000 Units  5,000 Units Subcutaneous Q8H Reesa Chew, MD   5,000 Units at 10/17/21 0518  ? hydrALAZINE (APRESOLINE) injection 10 mg  10 mg Intravenous Q4H PRN Rise Patience, MD   10 mg at  10/16/21 3220  ? insulin aspart (novoLOG) injection 0-9 Units  0-9 Units Subcutaneous Q4H Margaretha Seeds, MD   2 Units at 10/16/21 1223  ? loperamide HCl (IMODIUM) 1 MG/7.5ML suspension 2 mg  2 mg Oral PRN Pauletta Browns, RPH      ? ondansetron (ZOFRAN) injection 4 mg  4 mg Intravenous Q6H PRN Etta Quill, DO   4 mg at 10/16/21 1636  ? oxyCODONE-acetaminophen (PERCOCET/ROXICET) 5-325 MG per tablet 1-2 tablet  1-2 tablet Oral Q6H PRN Pauletta Browns, RPH      ? pentafluoroprop-tetrafluoroeth (GEBAUERS) aerosol 1 application.  1 application. Topical PRN Reesa Chew, MD      ? sevelamer carbonate (RENVELA) tablet 800 mg  800 mg Oral TID WC Roney Jaffe, MD      ?  ? ? ?Review of Systems: ?10 systems reviewed and negative except per interval history/subjective ? ?Physical Exam: ?Vitals:  ? 10/17/21 1000 10/17/21 1119  ?BP: (!) 149/56   ?Pulse: 85   ?Resp: (!) 22   ?Temp:  98.9 ?F (37.2 ?C)  ?SpO2: 97%   ? ?Total I/O ?In: 240 [P.O.:240] ?Out: -  ? ?Intake/Output Summary (Last 24 hours) at 10/17/2021 1207 ?Last data filed at 10/17/2021 0800 ?Gross per 24 hour  ?Intake 609.4 ml  ?Output 2975 ml  ?Net -2365.6 ml  ? ?Constitutional: Sitting in bedside chair ?ENMT: ears and nose without scars or lesions, MMM ?CV: normal rate, tr pitting edema in the bilateral lower extremities ?Respiratory: Coarse breath sounds in the bilateral anterior lung fields ?Gastrointestinal: soft, non-tender, minimal distention ?Skin: no visible lesions or rashes ?Psych: Sedated, not interactive, does not answer questioning ?Access: lt BCF, pulsatile at inflow and poor augmentation, RIJ TC ? ?Test Results ?I personally reviewed new and old clinical labs and radiology tests ?Lab Results  ?Component Value Date  ? NA 139 10/17/2021  ? K 3.5 10/17/2021  ? CL 103 10/17/2021  ? CO2 25 10/17/2021  ? BUN 39 (H) 10/17/2021  ? CREATININE 3.89 (H) 10/17/2021  ? CALCIUM 7.6 (L) 10/17/2021  ? ALBUMIN 3.0 (L) 10/17/2021  ? PHOS 5.7 (H)  10/17/2021  ? ? ?CBC ?Recent Labs  ?Lab 10/15/21 ?2542 10/16/21 ?7062 10/16/21 ?0422 10/17/21 ?0050  ?WBC 5.2 5.0  --  6.7  ?NEUTROABS 4.0 3.6  --  5.3  ?HGB 8.4* 8.7* 9.2* 9.1*  ?HCT 26.0* 27.5* 27.0* 27.9*  ?MCV 98.1 98.2  --  95.9  ?PLT 96* 85*  --  83*  ? ? ? ? ? ?

## 2021-10-17 NOTE — TOC Progression Note (Signed)
Transition of Care (TOC) - Progression Note  ? ? ?Patient Details  ?Name: Robert Lucas ?MRN: [id number] ?Date of Birth: 12-21-1940 ? ?Transition of Care (TOC) CM/SW Contact  ?Angelita Ingles, RN ?Phone Number:[phone] ? ?10/17/2021, 1:02 PM ? ?Clinical Narrative:    ? ?Transition of Care (TOC) Screening Note ? ? ?Patient Details  ?Name: Robert Lucas ?Date of Birth: 1941-02-23 ? ? ?Transition of Care (TOC) CM/SW Contact:    ?Angelita Ingles, RN ?Phone Number: ?10/17/2021, 1:13 PM ? ? ? ?Transition of Care Department [facility]) has reviewed patient and no TOC needs have been identified at this time. We will continue to monitor patient advancement through interdisciplinary progression rounds. If new patient transition needs arise, please place a TOC consult. ? ? ? ? ?  ?  ? ?Expected Discharge Plan and Services ?  ?  ?  ?  ?  ?                ?  ?  ?  ?  ?  ?  ?  ?  ?  ?  ? ? ?Social Determinants of Health (SDOH) Interventions ?  ? ?Readmission Risk Interventions ?   ? View : No data to display.  ?  ?  ?  ? ? ?

## 2021-10-17 NOTE — Progress Notes (Signed)
Requested to see pt for out-pt HD needs. Met with pt at bedside this afternoon. Introduced self and explained role. Pt prefers [facility]. Referral submitted to [facility] admissions this evening. Pt states that pt's wife will provide transportation to HD appt at d/c. Will follow and assist.  ? ?Melven Sartorius ?Renal Navigator ?[phone] ?

## 2021-10-17 NOTE — Progress Notes (Signed)
? ?NAME:  Robert Lucas, MRN:  [id number], DOB:  1941-05-26, LOS: 4 ?ADMISSION DATE:  10/13/2021, CONSULTATION DATE:  04/16 ?REFERRING MD:  Alcario Drought, CHIEF COMPLAINT:  Hypoxia  ? ?History of Present Illness:  ?Patient is encephalopathic and/or intubated. Therefore history has been obtained from chart review.  ?  ?Robert Lucas, is a 81 y.o. male, who was admitted on 4/13 after increasing shortness of breath over a few weeks.  He was found to have worsening creatinine with worsening volume overload. He was admitted to the hospitalist service ?  ?They have a pertinent past medical history of CKD stage V, HFrEF (02/23 EF 30-35%), CAD, HTN, DM 2, OSA/OHS, obesity ?  ?The morning of 4/16 called to the bedside for hypoxia and altered mental status.  Patient received Percocet around 2200.  Patient working with nurse, vomited, patient aspirated around 2 AM on 4/16.  Went from no oxygen requirement to nonrebreather.  Patient minimally responsive to pain and nonverbal. ABG 7.15/66/62/23.  Emergently transferred to the ICU and intubated. ?  ?PCCM assumed care. ?Pertinent  Medical History  ?CKD stage V, HFrEF (02/23 EF 30-35%), CAD, HTN, DM 2, OSA/OHS, obesity ?Significant Hospital Events: ?Including procedures, antibiotic start and stop dates in addition to other pertinent events   ?04/13-Admitted to hospital for respiratory distress 2/2 to volume overload.  ?04/15-HD catheter placed. HD done with 1000 removed.  ?04/16-Intubated and transferred to the ICU. Extubated later during the day.  ?Interim History / Subjective:  ?Denies any acute concerns. States he has a cough that is bothering him.  ?Review of Systems:   ?Negative unless stated in the subjective. ?Objective   ?Blood pressure 139/66, pulse 79, temperature 99.1 ?F (37.3 ?C), temperature source Oral, resp. rate 16, height 5\' 3"  (1.6 m), weight 74.7 kg, SpO2 96 %. ?   ? ? ?Intake/Output Summary (Last 24 hours) at 10/17/2021 0715 ?Last data filed at 10/17/2021 0300 ?Gross  per 24 hour  ?Intake 399.61 ml  ?Output 2975 ml  ?Net -2575.39 ml  ? ?2 L HD removal. ?Filed Weights  ? 10/16/21 0332 10/16/21 1230 10/17/21 0500  ?Weight: 75.1 kg 75.1 kg 74.7 kg  ? ?Examination: ?General: NAD ?HENT: MMM, NCAT ?Lungs: bibasilar rales, no wheeze, or rhonci present ?Cardiovascular: NSR, 2+ pulses , no LE edema ?Abdomen: no TTP, normal bowel sounds ?Extremities: no asymmetry ?Neuro: alert and oriented x4 ?GU: suction catheter.  ?Consults  ?Nephrology  ?Vascular surgery ?Resolved Hospital Problem list   ? ?Assessment & Plan:  ?Acute respiratory failure with hypoxia and hypercapnia secondary to aspiration event ?Acute Metabolic Encephalopathy ?Intubated after vomiting episode at 0200 am. ABG showed pH of 7.15. -TXR to ICU. Extubated yesterday and satting fine on RA. His encephalopathy is resolved. No fevers or leukocytosis, so we will discontinue abx and monitor for signs of infection.  ?Bradycardia ?Hypomagnesemia ?Improved. Now in NSR. Believe was secondary sedation. Goal K above 4, goal MG above 2. K at 3.6 this am and Mg at 1.6.  ?-cont tele ?-Replete for goal of K of 4 and Mg 2.  ?Acute AKI on CKD stage V ?Normocytic anemia ?New Vas-Cath in place, last HD 4/15 removed 1000 cc and 2000cc on 04/16.  Patient reportedly making urine. Anemia most likely secondary to CKDIV. HD planned for today.  ?-Nephrology following ?-Follow BMP ?-Transfuse PRBC if HBG less than 7 ?-Obtain AM CBC to trend H&H ?-Monitor for signs of bleeding ? Acute on chronic systolic CHF ?February 2023 EF 30-35% with grade 2  diastolic dysfunction ?-CTM fluid status, strict I/Os. ?-Treat HTN and DM2 as below.  ?CAD ?Hypertension ?-Home antihypertensives (Toprol, Norvasc, Isordil, and hydralazine, and Lasix) held. BP doing well MAP greater than 90. Patient given PRN hydralazine 10 mg this am. Will restart home meds except lasix .  ?-Continue home lipitor ?-Continuous telemetry ?-asa 81 ?DM2 ?-Blood Glucose goal 140-180.  CBG within  goal.  ?-SSI ?Thrombocytopenia ?Stable. Platelets 105> 96> 85 >83.  Etiology unclear at this time but may be secondary to decreased renal function and his uremia.  ?-Monitor on a.m. labs ?-SCD ?Best Practice (right click and "Reselect all SmartList Selections" daily)  ?Diet/type: Renal diet ?DVT prophylaxis: Heparin ?GI prophylaxis: NA ?Lines: N/A ?Foley:  N/A ?Continuous:   ?Code Status:  full code ?Last date of multidisciplinary goals of care discussion [04/16 update to wife] ?Labs   ?CBC stable since yesterday with no leukocytosis and stable hgb. BMP shows improved creatinine after HD. TP and albumin still low at 6 and 3. Mag at 1.8 and phosphorus improved 5.7. ? ? ?CBC: ?Recent Labs  ?Lab 10/14/21 ?0257 10/14/21 ?1026 10/15/21 ?0257 10/16/21 ?0259 10/16/21 ?0422 10/17/21 ?0050  ?WBC 4.8 4.7 5.2 5.0  --  6.7  ?NEUTROABS 3.8 3.8 4.0 3.6  --  5.3  ?HGB 8.3* 8.3* 8.4* 8.7* 9.2* 9.1*  ?HCT 25.7* 25.7* 26.0* 27.5* 27.0* 27.9*  ?MCV 97.0 97.7 98.1 98.2  --  95.9  ?PLT 103* 105* 96* 85*  --  83*  ? ? ?Basic Metabolic Panel: ?Recent Labs  ?Lab 10/14/21 ?0257 10/14/21 ?1026 10/15/21 ?0257 10/16/21 ?0259 10/16/21 ?0422 10/17/21 ?0050  ?NA 132* 135 136 137 138 139  ?K 4.3 4.3 4.2 3.6 3.5 3.5  ?CL 104 105 107 104  --  103  ?CO2 15* 15* 13* 20*  --  25  ?GLUCOSE 183* 167* 202* 194*  --  102*  ?BUN 118* 121* 123* 73*  --  39*  ?CREATININE 7.17* 7.40* 7.51* 5.38*  --  3.89*  ?CALCIUM 7.3* 7.4* 7.3* 7.1*  --  7.6*  ?MG  --  1.6* 1.7 1.6*  --  1.8  ?PHOS  --  10.4* 10.9* 7.7*  --  5.7*  ? ? ?GFR: ?Estimated Creatinine Clearance: 13.3 mL/min (A) (by C-G formula based on SCr of 3.89 mg/dL (H)). ?Recent Labs  ?Lab 10/14/21 ?0257 10/14/21 ?7829 10/14/21 ?1026 10/15/21 ?0257 10/16/21 ?0259 10/17/21 ?0050  ?WBC 4.8  --  4.7 5.2 5.0 6.7  ?LATICACIDVEN 0.7 0.6  --   --   --   --   ? ? ?Liver Function Tests: ?Recent Labs  ?Lab 10/14/21 ?0257 10/14/21 ?1026 10/15/21 ?0257 10/16/21 ?0259 10/17/21 ?0050  ?AST 15 18 16 21 22   ?ALT 18 17 18 17  17   ?ALKPHOS 71 71 69 73 72  ?BILITOT 0.8 1.0 0.5 0.6 1.0  ?PROT 6.2* 5.9* 6.0* 6.4* 6.0*  ?ALBUMIN 3.0* 3.2* 3.0* 3.2* 3.0*  ? ? ?No results for input(s): LIPASE, AMYLASE in the last 168 hours. ?No results for input(s): AMMONIA in the last 168 hours. ?ABG ?   ?Component Value Date/Time  ? PHART 7.321 (L) 10/16/2021 0422  ? PCO2ART 46.5 10/16/2021 0422  ? PO2ART 345 (H) 10/16/2021 0422  ? HCO3 24.0 10/16/2021 0422  ? TCO2 25 10/16/2021 0422  ? ACIDBASEDEF 2.0 10/16/2021 0422  ? O2SAT 100 10/16/2021 0422  ? ?  ?Coagulation Profile: ?No results for input(s): INR, PROTIME in the last 168 hours. ?Cardiac Enzymes: ?No results for input(s): CKTOTAL, CKMB,  CKMBINDEX, TROPONINI in the last 168 hours. ?HbA1C: ?Hgb A1c MFr Bld  ?Date/Time Value Ref Range Status  ?10/15/2021 10:55 AM 7.4 (H) 4.8 - 5.6 % Final  ?  Comment:  ?  (NOTE) ?Pre diabetes:          5.7%-6.4% ? ?Diabetes:              >6.4% ? ?Glycemic control for   <7.0% ?adults with diabetes ?  ?07/10/2020 05:57 AM 6.3 (H) 4.8 - 5.6 % Final  ?  Comment:  ?  (NOTE) ?Pre diabetes:          5.7%-6.4% ? ?Diabetes:              >6.4% ? ?Glycemic control for   <7.0% ?adults with diabetes ?  ? ?CBG: ?Recent Labs  ?Lab 10/16/21 ?1216 10/16/21 ?1543 10/16/21 ?1915 10/16/21 ?2304 10/17/21 ?0310  ?GLUCAP 178* 81 83 94 101*  ? ? ?Past Medical History:  ?He,  has a past medical history of Arthritis, Bell's palsy, CKD (chronic kidney disease), Diabetes ([place]), Hypercholesteremia, Hypertension, Myocardial infarction ([place]) (1990), Neuromuscular disorder ([place]), and PONV (postoperative nausea and vomiting).  ?Surgical History:  ? ?Past Surgical History:  ?Procedure Laterality Date  ? AV FISTULA PLACEMENT Left 05/17/2021  ? Procedure: LEFT ARM ARTERIOVENOUS (AV) FISTULA CREATION;  Surgeon: Broadus John, MD;  Location: [facility] OR;  Service: Vascular;  Laterality: Left;  PERIPHERAL NERVE BLOCK  ? BACK SURGERY  yrs ago  ? lower  ? CARDIAC CATHETERIZATION    ? CATARACT EXTRACTION Right  01/2021  ? CHOLECYSTECTOMY N/A 05/02/2017  ? Procedure: LAPAROSCOPIC CHOLECYSTECTOMY;  Surgeon: Ralene Ok, MD;  Location: [place];  Service: General;  Laterality: N/A;  ? CORONARY ANGIOPLASTY    ? 2 199

## 2021-10-17 NOTE — Progress Notes (Signed)
PT Cancellation Note ? ?Patient Details ?Name: Robert Lucas ?MRN: [id number] ?DOB: 11/01/1940 ? ? ?Cancelled Treatment:    Reason Eval/Treat Not Completed: Other (comment) ? ?Pt up in chair and just vomitted.  Nursing medicating pt.  Pt request PT later this afternoon.  PT agrees. ?Abran Richard, PT ?Acute Rehab Services ?Pager [phone] ?[facility] [phone] ? ?Mikael Spray Emitt Maglione ?10/17/2021, 12:45 PM ?

## 2021-10-17 NOTE — Progress Notes (Signed)
Received pt from 69M.  Patient is alert and oriented x 4.  Oriented to room and unit routine.  No complaints offered at this time.  Assisted in bed in position of comfort.  Needs addressed. ?

## 2021-10-18 ENCOUNTER — Encounter (HOSPITAL_COMMUNITY): Payer: Self-pay

## 2021-10-18 DIAGNOSIS — E1169 Type 2 diabetes mellitus with other specified complication: Secondary | ICD-10-CM | POA: Diagnosis present

## 2021-10-18 DIAGNOSIS — I509 Heart failure, unspecified: Secondary | ICD-10-CM

## 2021-10-18 DIAGNOSIS — G9341 Metabolic encephalopathy: Secondary | ICD-10-CM

## 2021-10-18 DIAGNOSIS — N186 End stage renal disease: Secondary | ICD-10-CM

## 2021-10-18 DIAGNOSIS — E669 Obesity, unspecified: Secondary | ICD-10-CM | POA: Diagnosis present

## 2021-10-18 LAB — COMPREHENSIVE METABOLIC PANEL
ALT: 19 U/L (ref 0–44)
AST: 26 U/L (ref 15–41)
Albumin: 2.9 g/dL — ABNORMAL LOW (ref 3.5–5.0)
Alkaline Phosphatase: 72 U/L (ref 38–126)
Anion gap: 11 (ref 5–15)
BUN: 11 mg/dL (ref 8–23)
CO2: 27 mmol/L (ref 22–32)
Calcium: 7.9 mg/dL — ABNORMAL LOW (ref 8.9–10.3)
Chloride: 95 mmol/L — ABNORMAL LOW (ref 98–111)
Creatinine, Ser: 1.97 mg/dL — ABNORMAL HIGH (ref 0.61–1.24)
GFR, Estimated: 33 mL/min — ABNORMAL LOW (ref >60)
Glucose, Bld: 129 mg/dL — ABNORMAL HIGH (ref 70–99)
Potassium: 3.3 mmol/L — ABNORMAL LOW (ref 3.5–5.1)
Sodium: 133 mmol/L — ABNORMAL LOW (ref 135–145)
Total Bilirubin: 1.1 mg/dL (ref 0.3–1.2)
Total Protein: 6.2 g/dL — ABNORMAL LOW (ref 6.5–8.1)

## 2021-10-18 LAB — CBC WITH DIFFERENTIAL/PLATELET
Abs Immature Granulocytes: 0.02 10*3/uL (ref 0.00–0.07)
Basophils Absolute: 0 10*3/uL (ref 0.0–0.1)
Basophils Relative: 1 %
Eosinophils Absolute: 0.1 10*3/uL (ref 0.0–0.5)
Eosinophils Relative: 2 %
HCT: 27 % — ABNORMAL LOW (ref 39.0–52.0)
Hemoglobin: 8.7 g/dL — ABNORMAL LOW (ref 13.0–17.0)
Immature Granulocytes: 0 %
Lymphocytes Relative: 12 %
Lymphs Abs: 0.6 10*3/uL — ABNORMAL LOW (ref 0.7–4.0)
MCH: 30.9 pg (ref 26.0–34.0)
MCHC: 32.2 g/dL (ref 30.0–36.0)
MCV: 95.7 fL (ref 80.0–100.0)
Monocytes Absolute: 0.9 10*3/uL (ref 0.1–1.0)
Monocytes Relative: 16 %
Neutro Abs: 3.8 10*3/uL (ref 1.7–7.7)
Neutrophils Relative %: 69 %
Platelets: 88 10*3/uL — ABNORMAL LOW (ref 150–400)
RBC: 2.82 MIL/uL — ABNORMAL LOW (ref 4.22–5.81)
RDW: 16.4 % — ABNORMAL HIGH (ref 11.5–15.5)
WBC: 5.5 10*3/uL (ref 4.0–10.5)
nRBC: 0 % (ref 0.0–0.2)

## 2021-10-18 LAB — MAGNESIUM: Magnesium: 1.7 mg/dL (ref 1.7–2.4)

## 2021-10-18 LAB — CULTURE, BLOOD (ROUTINE X 2)
Culture: NO GROWTH
Culture: NO GROWTH
Special Requests: ADEQUATE
Special Requests: ADEQUATE

## 2021-10-18 LAB — GLUCOSE, CAPILLARY
Glucose-Capillary: 107 mg/dL — ABNORMAL HIGH (ref 70–99)
Glucose-Capillary: 130 mg/dL — ABNORMAL HIGH (ref 70–99)
Glucose-Capillary: 136 mg/dL — ABNORMAL HIGH (ref 70–99)
Glucose-Capillary: 149 mg/dL — ABNORMAL HIGH (ref 70–99)
Glucose-Capillary: 162 mg/dL — ABNORMAL HIGH (ref 70–99)
Glucose-Capillary: 96 mg/dL (ref 70–99)

## 2021-10-18 LAB — PHOSPHORUS: Phosphorus: 2.8 mg/dL (ref 2.5–4.6)

## 2021-10-18 MED ORDER — SODIUM PHOSPHATES 45 MMOLE/15ML IV SOLN
30.0000 mmol | Freq: Once | INTRAVENOUS | Status: AC
  Administered 2021-10-18: 30 mmol via INTRAVENOUS
  Filled 2021-10-18: qty 10

## 2021-10-18 MED ORDER — KIDNEY FAILURE BOOK: Freq: Once | Status: AC

## 2021-10-18 MED ORDER — ORAL CARE MOUTH RINSE
15.0000 mL | Freq: Two times a day (BID) | OROMUCOSAL | Status: DC
  Administered 2021-10-18 (×2): 15 mL via OROMUCOSAL

## 2021-10-18 MED ORDER — SODIUM CHLORIDE 0.9 % IV SOLN
250.0000 mg | INTRAVENOUS | Status: DC
  Administered 2021-10-19: 250 mg via INTRAVENOUS
  Filled 2021-10-18: qty 20

## 2021-10-18 MED ORDER — DARBEPOETIN ALFA 150 MCG/0.3ML IJ SOSY
150.0000 ug | PREFILLED_SYRINGE | INTRAMUSCULAR | Status: DC
  Administered 2021-10-19: 150 ug via INTRAVENOUS
  Filled 2021-10-18 (×2): qty 0.3

## 2021-10-18 NOTE — Progress Notes (Addendum)
?PROGRESS NOTE ? ? ? ?Robert Lucas  QTM:[id number] DOB: Jul 05, 1940 DOA: 10/13/2021 ?PCP: [name], NP  ? ?Brief Narrative:  ? Robert Lucas is a 81 y.o. WM PMHx chronic systolic and diastolic CHF last EF measured in February 2023 was 30 to 35% with grade 2 diastolic dysfunction, CAD S/P PTCA, CKD stage V admitted in February for fluid overload improved with diuresis.  OSA/OHS ? ?Has been having increasing worsening shortness of breath for the last few weeks presently will address denies any associated chest pain.  Has been having some productive cough for the last 2 weeks.  Denies fever chills.  Has been making urine.  Present to the ER at med center. ?  ?ED Course: In the ER patient was short of breath but not hypoxic chest x-ray shows congestion and possible infiltrate.  Patient has been hypothermic in the ER and had consistently remained hypothermic with temperature of 94 ?F.  Lab work show creatinine of 6.95 which has progressively worsened from 4.8 in September 07, 2021 and is around 6.08 in October 05, 2021 that is around a week ago and it is around 6.95.  Potassium is 4.3 bicarb of 16 anion gap of 16.  BNP is 1500 and I sensitive troponin was 30 and 27.  ER physician discussed with on-call nephrologist who advised patient to be placed on 80 mg IV Lasix.  On my exam patient's abdomen appears distended and chest appears mildly rhonchorous.  Patient's blood pressure shortly after I admitted has been in the low normal's.  Patient still remains hypothermic.  Plan is to place patient on Bair hugger and get blood cultures and since chest x-ray does show infiltrate and patient is having productive cough we will start patient on antibiotics. ? ? ?Subjective: ?4/18 afebrile overnight, A/O x4.  States feels much better. ? ? ?Assessment & Plan: ? Covid vaccination; ? ?Principal Problem: ?  Fluid overload ?Active Problems: ?  Type 2 diabetes mellitus with hyperlipidemia ([place]) ?  Essential hypertension ?  Acute on  chronic systolic CHF (congestive heart failure) ([place]) ?  Acute renal failure superimposed on stage 4 chronic kidney disease ([place]) ?  Hypothermia ?  ARF (acute renal failure) ([place]) ?  OSA (obstructive sleep apnea) ?  Obesity hypoventilation syndrome ([place]) ?  Acute respiratory failure with hypoxia and hypercapnia ([facility]) ?  Acute metabolic encephalopathy ?  Aspiration pneumonia of both lower lobes due to vomit ([place]) ?  Diabetes mellitus type 2 in obese [facility]) ? ?Acute on CKD stage V (baseline Cr 4.8 in September 07, 2021) ?Lab Results  ?Component Value Date  ? CREATININE 1.97 (H) 10/18/2021  ? CREATININE 3.89 (H) 10/17/2021  ? CREATININE 5.38 (H) 10/16/2021  ? CREATININE 7.51 (H) 10/15/2021  ? CREATININE 7.40 (H) 10/14/2021  ?-4/14 discussed case with Dr Joylene Grapes Nephrology they are familiar with patient, have seen him in clinic before.  Plan is for placement of Vas-Cath by IR and then HD. ?-4/14 diuretics per Nephrology ?-4/18 fistulogram planned in the A.m. by vascular surgery ? ?DM nephropathy ?-See CKD stage V ? ?Acute on chronic systolic CHF ?-February 6256 EF of 30 to 35% with grade 2 diastolic dysfunction ?- Strict in and out -4.2 L ?- Daily weight ?Filed Weights  ? 10/17/21 0500 10/17/21 2230 10/18/21 0227  ?Weight: 74.7 kg 74.9 kg 72.4 kg  ?  ? ?Essential HTN ?- PRN hydralazine ? ?CAD ?- Trend cardiac markers, ?-4/13 check BNP= 1500.6 ? ?DM type II controlled with complication ?-  4/15 hemoglobin A1c= 7.4 ?- 4/15 LDL= 46: Within goal ? 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 ?- Anemia panel pending ? ?Altered ental status ?- 4/15 most likely secondary to patient being uremic.  Scheduled for HD this morning. ?-4/18 resolved ? ?Gout ?- 4/15 uric acid= 4.3 within goal ? ?Diarrhea ?- 4/14 patient states has not had any episodes of diarrhea in the last 24 hours. ? ?OSA/OHS ?- 4/14 CPAP per respiratory therapy settings ? ?Obesity (BMI 33.47 kg/m?) ?-Patient discussed weight loss strategies with PCP ? ?Hypokalemia ?- Potassium goal> 4 ?-  4/18 sodium phosphate IV 30 mmol ? ? ? ?Mobility Assessment (last 72 hours)   ? ? Mobility Assessment   ? ? [place] Name 10/18/21 1700 10/18/21 1100 10/17/21 2200 10/17/21 1545 10/17/21 1453  ? Does patient have an order for bedrest or is patient medically unstable -- No - Continue assessment No - Continue assessment No - Continue assessment --  ? What is the highest level of mobility based on the progressive mobility assessment? Level 5 (Walks with assist in room/hall) - Balance while stepping forward/back and can walk in room with assist - Complete Level 5 (Walks with assist in room/hall) - Balance while stepping forward/back and can walk in room with assist - Complete -- Level 5 (Walks with assist in room/hall) - Balance while stepping forward/back and can walk in room with assist - Complete Level 5 (Walks with assist in room/hall) - Balance while stepping forward/back and can walk in room with assist - Complete  ? Is the above level different from baseline mobility prior to current illness? -- Yes - Recommend PT order -- Yes - Recommend PT order --  ? ? [place] Name 10/17/21 0800 10/16/21 0800 10/15/21 2030  ?  ?  ? Does patient have an order for bedrest or is patient medically unstable No - Continue assessment No - Continue assessment No - Continue assessment    ? What is the highest level of mobility based on the progressive mobility assessment? Level 2 ([place]) - Balance while sitting on edge of bed and cannot stand Level 1 (Bedfast) - Unable to balance while sitting on edge of bed Level 3 (Stands with assist) - Balance while standing  and cannot march in place    ? Is the above level different from baseline mobility prior to current illness? Yes - Recommend PT order Yes - Recommend PT order Yes - Recommend PT order    ? ?  ?  ? ?  ? ? ?DVT prophylaxis: Subcu heparin ?Code Status: Partial ?Family Communication:  ?Status is: Inpatient ? ? ? ?Dispo: The patient is from: Home ?             Anticipated d/c is to:  Home ?             Anticipated d/c date is: 3 days ?             Patient currently is not medically stable to d/c. ? ? ? ? ? ?Consultants:  ?Nephrology ?IR ? ?Procedures/Significant Events:  ?4/15 RIGHT IJ HD cath>>>>> ? ? ? ?I have personally reviewed and interpreted all radiology studies and my findings are as above. ? ?VENTILATOR SETTINGS: ? ? ? ?Cultures ? ? ?Antimicrobials: ? ? ? ?Devices ?  ? ?LINES / TUBES:  ? ? ? ? ?Continuous Infusions: ? [START ON 10/19/2021] ferric gluconate (FERRLECIT) IVPB    ? ? ? ?Objective: ?Vitals:  ? 10/18/21 0253 10/18/21 0350 10/18/21 0925 10/18/21  1647  ?BP: (!) 156/66 (!) 154/73 (!) 138/53 (!) 157/71  ?Pulse: 80 83 65 88  ?Resp: 16 19 15 15   ?Temp:  97.9 ?F (36.6 ?C) 98.1 ?F (36.7 ?C)   ?TempSrc:  Oral Oral   ?SpO2: 99% 93% (!) 83% 93%  ?Weight:      ?Height:      ? ? ?Intake/Output Summary (Last 24 hours) at 10/18/2021 1952 ?Last data filed at 10/18/2021 1800 ?Gross per 24 hour  ?Intake 480 ml  ?Output 2500 ml  ?Net -2020 ml  ? ?Filed Weights  ? 10/17/21 0500 10/17/21 2230 10/18/21 0227  ?Weight: 74.7 kg 74.9 kg 72.4 kg  ? ?Examination: ? ?General: A/O x4.  No acute respiratory distress ?Eyes: negative scleral hemorrhage, negative anisocoria, negative icterus ?ENT: Negative Runny nose, negative gingival bleeding, ?Neck:  Negative scars, masses, torticollis, lymphadenopathy, JVD ?Lungs: decreased breath sounds bilaterally, positive expiratory wheezing  ?Cardiovascular: Regular rate and rhythm without murmur gallop or rub normal S1 and S2 ?Abdomen: Morbidly obese, negative abdominal pain, nondistended, positive soft, bowel sounds, no rebound, no ascites, no appreciable mass ?Extremities: No significant cyanosis, clubbing, or edema bilateral lower extremities ?Skin: Negative rashes, lesions, ulcers ?Psychiatric:  Negative depression, negative anxiety, negative fatigue, negative mania  ?Central nervous system:  Cranial nerves II through XII intact, tongue/uvula midline, all  extremities muscle strength 5/5, sensation intact throughout, negative dysarthria, negative expressive aphasia, negative receptive aphasia.  ?.  ? ? ? ?Data Reviewed: Care during the described time interval was provide

## 2021-10-18 NOTE — Progress Notes (Signed)
Nephrology Follow-Up Consult note ? ? ?Assessment/Recommendations: Robert Lucas is a/an 81 y.o. male with a past medical history significant for chronic systolic and diastolic HF (EF 00-76%, A2QJ), CAD s/p PTCA, HTN, T2DM, CKD V who presents with SOB and volume overload. ?  ?New ESRD, Uremia: Presenting with volume overload as well as uremia now deemed ESRD ?-HD catheter placed on 4/15 with IR, appreciate help ?-Plan next tomorrow on MWF regimen. Tolerated HD very early this am with net 2.5L UF. ? ?So far net neg 4.9L during this hospitalization. ? ?Awaiting CLIP w/ preference for [facility]. ? ?-AVF is not ready to use.  VVS following, likely fistulogram once the patient is more stable.  May have to convert to AVG.  Appreciate help ? ?-Renally dose medications for iHD ?-Strict I/Os , Daily weights  ?-Maintain MAP>65 for optimal renal perfusion.  ?-Avoid nephrotoxic medications including NSAIDs ?-Use synthetic opioids (Fentanyl/Dilaudid) if needed ? ?Acute hypoxic/hypercapnic respiratory failure: Some pulmonary edema but likely aspiration contributed significantly.  Likely pneumonitis.  Ventilation per primary team.  Dialysis tomorrow, additional UF certainly will help. No cramping with last HD. ?  ?Metabolic Acidosis - improving with hemodialysis.  Stopped oral bicarbonate ?  ?Suspected CAP: Now with possible aspiration component.  Antibiotics per primary team ?  ?Anemia due to CKD: Also with iron deficiency.  Holding IV iron due to concerns for infection.  Hemoglobin most recently 8.9.   ?-Transfuse for Hgb<7 g/dL ?- Loading w/ IV iron (250) + Aranesp 150 (tomorrow w/ hd) ?  ?Chronic systolic and diastolic HF - EF 33-54%, T6YB on most recent ECHO. Volume overloaded, but expect improvement with HD. Can slowly restart GDMT as tolerated given now on dialysis ?  ?Hypertension: ?-BP intermittently low-normal ?-hold home antihypertensives as needed (currently only on hydralazine PRN) ?  ?Diabetes Mellitus Type 2 with  Hyperglycemia: ?-Management per primary ? ?Secondary hyperparathyroidism: On calcitriol.  Started on sevelamer ? ? ?Recommendations conveyed to primary service.  ? ? ?[name] ?[facility] ?10/18/2021 ?7:26 AM ? ?___________________________________________________________ ? ?CC: Swelling ? ?Interval History/Subjective: Breathing certainly better after HD which he tolerated very early this am. Comfortable appearing but on 3L [employer]. Denies f/c/n/v/ dyspnea. ? ? ?Medications:  ?Current Facility-Administered Medications  ?Medication Dose Route Frequency Provider Last Rate Last Admin  ? acetaminophen (TYLENOL) tablet 650 mg  650 mg Oral Q6H PRN Pauletta Browns, RPH      ? Or  ? acetaminophen (TYLENOL) suppository 650 mg  650 mg Rectal Q6H PRN Pauletta Browns, RPH      ? allopurinol (ZYLOPRIM) tablet 100 mg  100 mg Oral Daily Pauletta Browns, RPH   100 mg at 10/17/21 1039  ? aspirin chewable tablet 81 mg  81 mg Oral Daily Pauletta Browns, RPH   81 mg at 10/17/21 1039  ? atorvastatin (LIPITOR) tablet 40 mg  40 mg Oral QHS Pauletta Browns, RPH   40 mg at 10/17/21 2114  ? calcitRIOL (ROCALTROL) capsule 0.25 mcg  0.25 mcg Oral Q M,W,F Pauletta Browns, RPH   0.25 mcg at 10/17/21 1039  ? Chlorhexidine Gluconate Cloth 2 % PADS 6 each  6 each Topical Q0600 Reesa Chew, MD   6 each at 10/17/21 1318  ? guaiFENesin-dextromethorphan (ROBITUSSIN DM) 100-10 MG/5ML syrup 5 mL  5 mL Oral Q4H PRN Pauletta Browns, RPH   5 mL at 10/18/21 0247  ? heparin injection 5,000 Units  5,000 Units Subcutaneous Q8H Santiago Bumpers  J, MD   5,000 Units at 10/18/21 0644  ? hydrALAZINE (APRESOLINE) injection 10 mg  10 mg Intravenous Q4H PRN Rise Patience, MD   10 mg at 10/16/21 1610  ? insulin aspart (novoLOG) injection 0-9 Units  0-9 Units Subcutaneous Q4H Margaretha Seeds, MD   3 Units at 10/17/21 1722  ? loperamide HCl (IMODIUM) 1 MG/7.5ML suspension 2 mg  2 mg Oral PRN Pauletta Browns, RPH      ?  ondansetron (ZOFRAN) injection 4 mg  4 mg Intravenous Q6H PRN Etta Quill, DO   4 mg at 10/17/21 1219  ? oxyCODONE-acetaminophen (PERCOCET/ROXICET) 5-325 MG per tablet 1-2 tablet  1-2 tablet Oral Q6H PRN Pauletta Browns, RPH      ? sevelamer carbonate (RENVELA) tablet 800 mg  800 mg Oral TID WC Roney Jaffe, MD   800 mg at 10/17/21 1640  ?  ? ? ?Review of Systems: ?10 systems reviewed and negative except per interval history/subjective ? ?Physical Exam: ?Vitals:  ? 10/18/21 0253 10/18/21 9604  ?BP: (!) 156/66 (!) 154/73  ?Pulse: 80 83  ?Resp: 16 19  ?Temp:  97.9 ?F (36.6 ?C)  ?SpO2: 99% 93%  ? ?No intake/output data recorded. ? ?Intake/Output Summary (Last 24 hours) at 10/18/2021 0726 ?Last data filed at 10/18/2021 5409 ?Gross per 24 hour  ?Intake 940 ml  ?Output 2500 ml  ?Net -1560 ml  ? ?Constitutional: Supine  in bed ?ENMT: ears and nose without scars or lesions, MMM ?CV: normal rate, tr pitting edema in the bilateral lower extremities ?Respiratory: Coarse breath sounds in the bilateral anterior lung fields ?Gastrointestinal: soft, non-tender, minimal distention ?Skin: no visible lesions or rashes ?Psych: Sedated, not interactive, does not answer questioning ?Access: lt BCF, pulsatile at inflow and poor augmentation, RIJ TC ? ?Test Results ?I personally reviewed new and old clinical labs and radiology tests ?Lab Results  ?Component Value Date  ? NA 133 (L) 10/18/2021  ? K 3.3 (L) 10/18/2021  ? CL 95 (L) 10/18/2021  ? CO2 27 10/18/2021  ? BUN 11 10/18/2021  ? CREATININE 1.97 (H) 10/18/2021  ? CALCIUM 7.9 (L) 10/18/2021  ? ALBUMIN 2.9 (L) 10/18/2021  ? PHOS 2.8 10/18/2021  ? ? ?CBC ?Recent Labs  ?Lab 10/16/21 ?8119 10/16/21 ?0422 10/17/21 ?0050 10/18/21 ?0346  ?WBC 5.0  --  6.7 5.5  ?NEUTROABS 3.6  --  5.3 3.8  ?HGB 8.7* 9.2* 9.1* 8.7*  ?HCT 27.5* 27.0* 27.9* 27.0*  ?MCV 98.2  --  95.9 95.7  ?PLT 85*  --  83* 88*  ? ? ? ? ? ?

## 2021-10-18 NOTE — Plan of Care (Signed)
  Problem: Activity: Goal: Risk for activity intolerance will decrease Outcome: Progressing   Problem: Elimination: Goal: Will not experience complications related to bowel motility Outcome: Progressing   Problem: Elimination: Goal: Will not experience complications related to urinary retention Outcome: Progressing   

## 2021-10-18 NOTE — Progress Notes (Signed)
Rounded on patient today in correlation to transition to outpatient HD. Kidney Failure given. Patient educated at the bedside regarding care of tunneled dialysis catheter, AV fistula care after fistulogram tomorrow by VVS, assessment of thrill and bruit, and proper medication administration on HD days.  Patient also educated on the importance of adhering to scheduled dialysis treatments, the effects of fluid overload, hyperkalemia and hyperphosphatemia. Patient capable of re-verbalizing via teach back method. Also educated patient on services available through the interdisciplinary team in the clinic setting. Patient reports that his wife is adamant about ensuring that he follows his diet restrictions and that she will be glad to have this information. Patient with no further questions at this time. Handouts and contact information provided to patient for any further assistance. Will follow as appropriate. ?

## 2021-10-18 NOTE — Progress Notes (Signed)
Physical Therapy Treatment ?Patient Details ?Name: Robert Lucas ?MRN: [id number] ?DOB: 19-Jun-1941 ?Today's Date: 10/18/2021 ? ? ?History of Present Illness Pt is 81 yo male admitted on 10/13/21 with worsening creatinine, volume overload, resp distress. Pt intubated 4/16 around 200 am, extubated later that afternoon 1626.  Pt also requiring initiationing of HD.  Pt with hx of CKD stage V, HF, CAD, HTN, DM, OSA, obesity. ? ?  ?PT Comments  ? ? Continuing work on functional mobility and activity tolerance;  session focused on progressive ambulation, with good progress noted; in fact distance, was limited by the imminent need for a bathroom stop, and he potentially could have walked farther;  ? ?We discussed the potential effects HD has on activity tolerance; A rollator can be helpful to give Robert Lucas the opportunity to sit if needed after HD; on track for dc home  ?Recommendations for follow up therapy are one component of a multi-disciplinary discharge planning process, led by the attending physician.  Recommendations may be updated based on patient status, additional functional criteria and insurance authorization. ? ?Follow Up Recommendations ? Home health PT ?  ?  ?Assistance Recommended at Discharge PRN  ?Patient can return home with the following A little help with walking and/or transfers;A little help with bathing/dressing/bathroom;Help with stairs or ramp for entrance ?  ?Equipment Recommendations ? None recommended by PT (REcommend pt use his rollator RW)  ?  ?Recommendations for Other Services   ? ? ?  ?Precautions / Restrictions Precautions ?Precautions: Fall ?Precaution Comments: Fall risk reduced with use of RW  ?  ? ?Mobility ? Bed Mobility ?  ?  ?  ?  ?  ?  ?  ?  ?  ? ?Transfers ?Overall transfer level: Needs assistance ?Equipment used: Rolling walker (2 wheels) ?Transfers: Sit to/from Stand ?Sit to Stand: Supervision ?  ?  ?  ?  ?  ?General transfer comment: Dependent on UEs to push, but did not  need physical assist; also stood from commode in bathroom using grab bar ?  ? ?Ambulation/Gait ?Ambulation/Gait assistance: Min guard (without physical contact) ?Gait Distance (Feet): 30 Feet (including bathroom stop) ?Assistive device: Rolling walker (2 wheels) ?Gait Pattern/deviations: Step-through pattern, Decreased stride length ?  ?  ?  ?General Gait Details: Less fatigued today, and able to walk further; amb distance limited by need to move bowels ? ? ?Stairs ?  ?  ?  ?  ?  ? ? ?Wheelchair Mobility ?  ? ?Modified Rankin (Stroke Patients Only) ?  ? ? ?  ?Balance   ?  ?Sitting balance-Leahy Scale: Good ?  ?  ?  ?Standing balance-Leahy Scale: Fair ?  ?  ?  ?  ?  ?  ?  ?  ?  ?  ?  ?  ?  ? ?  ?Cognition Arousal/Alertness: Awake/alert ?Behavior During Therapy: Robert Lucas for tasks assessed/performed ?Overall Cognitive Status: Within Functional Limits for tasks assessed ?  ?  ?  ?  ?  ?  ?  ?  ?  ?  ?  ?  ?  ?  ?  ?  ?  ?  ?  ? ?  ?Exercises   ? ?  ?General Comments General comments (skin integrity, edema, etc.): See other PT note of this date ?  ?  ? ?Pertinent Vitals/Pain Pain Assessment ?Pain Assessment: No/denies pain  ? ? ?Home Living   ?  ?  ?  ?  ?  ?  ?  ?  ?  ?   ?  ?  Prior Function    ?  ?  ?   ? ?PT Goals (current goals can now be found in the care plan section) Acute Rehab PT Goals ?Patient Stated Goal: return home ?PT Goal Formulation: With patient ?Time For Goal Achievement: 10/31/21 ?Potential to Achieve Goals: Good ?Progress towards PT goals: Progressing toward goals ? ?  ?Frequency ? ? ? Min 3X/week ? ? ? ?  ?PT Plan Current plan remains appropriate  ? ? ?Co-evaluation   ?  ?  ?  ?  ? ?  ?AM-PAC PT "6 Clicks" Mobility   ?Outcome Measure ? Help needed turning from your back to your side while in a flat bed without using bedrails?: None ?Help needed moving from lying on your back to sitting on the side of a flat bed without using bedrails?: A Little ?Help needed moving to and from a bed to a chair (including a  wheelchair)?: A Little ?Help needed standing up from a chair using your arms (e.g., wheelchair or bedside chair)?: A Little ?Help needed to walk in Lucas room?: A Little ?Help needed climbing 3-5 steps with a railing? : A Little ?6 Click Score: 19 ? ?  ?End of Session Equipment Utilized During Treatment: Oxygen ?Activity Tolerance: Patient tolerated treatment well ?Patient left: in chair;with call bell/phone within reach;with chair alarm set ?Nurse Communication: Mobility status (moved bowels) ?PT Visit Diagnosis: Other abnormalities of gait and mobility (R26.89);Muscle weakness (generalized) (M62.81) ?  ? ? ?Time: 0626-9485 ?PT Time Calculation (min) (ACUTE ONLY): 39 min ? ?Charges:  $Gait Training: 8-22 mins ?$Therapeutic Activity: 23-37 mins          ?          ? ?Roney Marion, PT  ?Acute Rehabilitation Services ?Office [phone] ? ? ? ?Colletta [place] ?10/18/2021, 5:18 PM ? ?

## 2021-10-18 NOTE — Progress Notes (Signed)
Physical Therapy Note ? ?(Full treatment note to follow) ? ?SATURATION QUALIFICATIONS: (This note is used to comply with regulatory documentation for home oxygen) ? ?Patient Saturations on Room Air at Rest = 88% ? ?Patient Saturations on Room Air while Ambulating = 84% ? ?Patient Saturations on 3 Liters of oxygen while Ambulating = 96% ? ?Please briefly explain why patient needs home oxygen: ?Patient requires supplemental oxygen to maintain oxygen saturations at acceptable, safe levels with physical activity. ? ? ?Roney Marion, PT  ?Acute Rehabilitation Services ?Office [phone] ? ?

## 2021-10-18 NOTE — Progress Notes (Addendum)
?  Progress Note ? ?VASCULAR SURGERY ASSESSMENT & PLAN:  ? ?POORLY MATURING LEFT UPPER ARM FISTULA: This patient has shown significant clinical improvement and is now back on the floor.  Assuming he is not being discharged today I have scheduled him for a fistulogram tomorrow.  From our standpoint he could be discharged after his fistulogram tomorrow. ? ?I have discussed the procedure and potential complications with the patient and he is agreeable to proceed. ? ?I have written preop orders. ? ?Gae Gallop, MD ?9:59 AM ? ? ?10/18/2021 ?8:04 AM ? ?Subjective:  no complaints.  Wants to go home ? ? ?Vitals:  ? 10/18/21 0253 10/18/21 1017  ?BP: (!) 156/66 (!) 154/73  ?Pulse: 80 83  ?Resp: 16 19  ?Temp:  97.9 ?F (36.6 ?C)  ?SpO2: 99% 93%  ? ?Physical Exam: ?Lungs:  non labored ?Extremities:  palpable thrill near anastomosis; flow is more pulsatile moving up the arm ?Neurologic: A&O ? ?CBC ?   ?Component Value Date/Time  ? WBC 5.5 10/18/2021 0346  ? RBC 2.82 (L) 10/18/2021 0346  ? HGB 8.7 (L) 10/18/2021 0346  ? HCT 27.0 (L) 10/18/2021 0346  ? PLT 88 (L) 10/18/2021 0346  ? MCV 95.7 10/18/2021 0346  ? MCH 30.9 10/18/2021 0346  ? MCHC 32.2 10/18/2021 0346  ? RDW 16.4 (H) 10/18/2021 0346  ? LYMPHSABS 0.6 (L) 10/18/2021 0346  ? MONOABS 0.9 10/18/2021 0346  ? EOSABS 0.1 10/18/2021 0346  ? BASOSABS 0.0 10/18/2021 0346  ? ? ?BMET ?   ?Component Value Date/Time  ? NA 133 (L) 10/18/2021 0346  ? K 3.3 (L) 10/18/2021 0346  ? CL 95 (L) 10/18/2021 0346  ? CO2 27 10/18/2021 0346  ? GLUCOSE 129 (H) 10/18/2021 0346  ? BUN 11 10/18/2021 0346  ? CREATININE 1.97 (H) 10/18/2021 0346  ? CALCIUM 7.9 (L) 10/18/2021 0346  ? CALCIUM 7.6 (L) 08/26/2021 0050  ? GFRNONAA 33 (L) 10/18/2021 0346  ? GFRAA 26 (L) 05/07/2019 0234  ? ? ?INR ?   ?Component Value Date/Time  ? INR 0.94 04/17/2013 0835  ? ? ? ?Intake/Output Summary (Last 24 hours) at 10/18/2021 0804 ?Last data filed at 10/18/2021 5102 ?Gross per 24 hour  ?Intake 700 ml  ?Output 2500 ml  ?Net  -1800 ml  ? ? ? ?Assessment/Plan:  81 y.o. male with poorly functioning fistula ? ?Patient now extubated and doing well having been transferred out of the ICU ?Plan is for L arm fistulogram with possible intervention.  This may be performed while inpatient if schedule allows.  Will discuss timing with Dr. Scot Dock ? ? ?Dagoberto Ligas, PA-C ?[facility] ?[phone] ?10/18/2021 ?8:04 AM ? ? ? ?

## 2021-10-18 NOTE — Care Management Important Message (Signed)
Important Message ? ?Patient Details  ?Name: Robert Lucas ?MRN: [id number] ?Date of Birth: 17-Oct-1940 ? ? ?Medicare Important Message Given:  Yes ? ? ? ? ?[name] ?10/18/2021, 3:07 PM ?

## 2021-10-18 NOTE — Progress Notes (Signed)
Informed by [facility] admissions that [facility] has no availability at this time. Pt being reviewed by [facility] for TTS chair. [facility] admissions to contact navigator once pt has been medically cleared. Met with pt at bedside to provide above info. Pt agreeable to going to [facility]. Update provided to nephrologist. Will assist as needed. ? ?Melven Sartorius ?Renal Navigator ?[phone] ?

## 2021-10-19 ENCOUNTER — Encounter (HOSPITAL_COMMUNITY): Payer: Medicare Other

## 2021-10-19 ENCOUNTER — Encounter (HOSPITAL_COMMUNITY)
Admission: EM | Disposition: A | Discharge status: Home Health | Payer: Self-pay | Source: Home / Self Care | Attending: Internal Medicine

## 2021-10-19 DIAGNOSIS — N185 Chronic kidney disease, stage 5: Secondary | ICD-10-CM

## 2021-10-19 DIAGNOSIS — T82898A Other specified complication of vascular prosthetic devices, implants and grafts, initial encounter: Secondary | ICD-10-CM

## 2021-10-19 HISTORY — PX: PERIPHERAL VASCULAR BALLOON ANGIOPLASTY: CATH118281

## 2021-10-19 LAB — CBC WITH DIFFERENTIAL/PLATELET
Abs Immature Granulocytes: 0.02 K/uL (ref 0.00–0.07)
Basophils Absolute: 0 K/uL (ref 0.0–0.1)
Basophils Relative: 1 %
Eosinophils Absolute: 0.2 K/uL (ref 0.0–0.5)
Eosinophils Relative: 3 %
HCT: 27.5 % — ABNORMAL LOW (ref 39.0–52.0)
Hemoglobin: 9 g/dL — ABNORMAL LOW (ref 13.0–17.0)
Immature Granulocytes: 0 %
Lymphocytes Relative: 13 %
Lymphs Abs: 0.8 K/uL (ref 0.7–4.0)
MCH: 31.3 pg (ref 26.0–34.0)
MCHC: 32.7 g/dL (ref 30.0–36.0)
MCV: 95.5 fL (ref 80.0–100.0)
Monocytes Absolute: 0.9 K/uL (ref 0.1–1.0)
Monocytes Relative: 16 %
Neutro Abs: 4.1 K/uL (ref 1.7–7.7)
Neutrophils Relative %: 67 %
Platelets: 95 K/uL — ABNORMAL LOW (ref 150–400)
RBC: 2.88 MIL/uL — ABNORMAL LOW (ref 4.22–5.81)
RDW: 16.1 % — ABNORMAL HIGH (ref 11.5–15.5)
WBC: 6 K/uL (ref 4.0–10.5)
nRBC: 0 % (ref 0.0–0.2)

## 2021-10-19 LAB — COMPREHENSIVE METABOLIC PANEL WITH GFR
ALT: 19 U/L (ref 0–44)
AST: 24 U/L (ref 15–41)
Albumin: 3 g/dL — ABNORMAL LOW (ref 3.5–5.0)
Alkaline Phosphatase: 73 U/L (ref 38–126)
Anion gap: 11 (ref 5–15)
BUN: 18 mg/dL (ref 8–23)
CO2: 26 mmol/L (ref 22–32)
Calcium: 7.9 mg/dL — ABNORMAL LOW (ref 8.9–10.3)
Chloride: 97 mmol/L — ABNORMAL LOW (ref 98–111)
Creatinine, Ser: 3.38 mg/dL — ABNORMAL HIGH (ref 0.61–1.24)
GFR, Estimated: 17 mL/min — ABNORMAL LOW
Glucose, Bld: 88 mg/dL (ref 70–99)
Potassium: 3.3 mmol/L — ABNORMAL LOW (ref 3.5–5.1)
Sodium: 134 mmol/L — ABNORMAL LOW (ref 135–145)
Total Bilirubin: 1.2 mg/dL (ref 0.3–1.2)
Total Protein: 6.1 g/dL — ABNORMAL LOW (ref 6.5–8.1)

## 2021-10-19 LAB — GLUCOSE, CAPILLARY
Glucose-Capillary: 90 mg/dL (ref 70–99)
Glucose-Capillary: 91 mg/dL (ref 70–99)

## 2021-10-19 LAB — MAGNESIUM: Magnesium: 1.8 mg/dL (ref 1.7–2.4)

## 2021-10-19 LAB — PHOSPHORUS: Phosphorus: 5.5 mg/dL — ABNORMAL HIGH (ref 2.5–4.6)

## 2021-10-19 SURGERY — PERIPHERAL VASCULAR BALLOON ANGIOPLASTY
Anesthesia: LOCAL

## 2021-10-19 MED ORDER — LIDOCAINE HCL (PF) 1 % IJ SOLN
INTRAMUSCULAR | Status: AC
  Filled 2021-10-19: qty 30

## 2021-10-19 MED ORDER — FENTANYL CITRATE (PF) 100 MCG/2ML IJ SOLN
INTRAMUSCULAR | Status: AC
  Filled 2021-10-19: qty 2

## 2021-10-19 MED ORDER — LIDOCAINE HCL (PF) 1 % IJ SOLN
INTRAMUSCULAR | Status: DC | PRN
  Administered 2021-10-19: 2 mL

## 2021-10-19 MED ORDER — IODIXANOL 320 MG/ML IV SOLN
INTRAVENOUS | Status: DC | PRN
  Administered 2021-10-19: 100 mL

## 2021-10-19 MED ORDER — METOPROLOL SUCCINATE ER 50 MG PO TB24: 25.0000 mg | ORAL_TABLET | Freq: Every day | ORAL | 0 refills | Status: DC

## 2021-10-19 MED ORDER — HEPARIN SODIUM (PORCINE) 1000 UNIT/ML IJ SOLN
INTRAMUSCULAR | Status: AC
  Filled 2021-10-19: qty 4

## 2021-10-19 MED ORDER — FENTANYL CITRATE (PF) 100 MCG/2ML IJ SOLN
INTRAMUSCULAR | Status: DC | PRN
  Administered 2021-10-19: 50 ug via INTRAVENOUS

## 2021-10-19 SURGICAL SUPPLY — 18 items
BAG SNAP BAND KOVER 36X36 (MISCELLANEOUS) ×4 IMPLANT
BALLN IN.PACT DCB 6X150 (BALLOONS) ×3
BALLN MUSTANG 6X200X135 (BALLOONS) ×6
BALLOON MUSTANG 6X200X135 (BALLOONS) ×2 IMPLANT
CATH QUICKCROSS SUPP .035X90CM (MICROCATHETER) ×2 IMPLANT
COVER DOME SNAP 22 D (MISCELLANEOUS) ×4 IMPLANT
DCB IN.PACT 6X150 (BALLOONS) ×1 IMPLANT
GLIDEWIRE ADV .035X260CM (WIRE) ×2 IMPLANT
KIT MICROPUNCTURE NIT STIFF (SHEATH) ×2 IMPLANT
PROTECTION STATION PRESSURIZED (MISCELLANEOUS) ×3
SHEATH PINNACLE R/O II 5F 6CM (SHEATH) ×2 IMPLANT
SHEATH PINNACLE R/O II 6F 4CM (SHEATH) ×2 IMPLANT
SHEATH PROBE COVER 6X72 (BAG) ×4 IMPLANT
STATION PROTECTION PRESSURIZED (MISCELLANEOUS) ×3 IMPLANT
STOPCOCK MORSE 400PSI 3WAY (MISCELLANEOUS) ×6 IMPLANT
TRAY PV CATH (CUSTOM PROCEDURE TRAY) ×4 IMPLANT
TUBING CIL FLEX 10 FLL-RA (TUBING) ×4 IMPLANT
WIRE BENTSON .035X145CM (WIRE) ×2 IMPLANT

## 2021-10-19 NOTE — Progress Notes (Addendum)
Contacted [facility] admissions this morning to request update on pt's referral. Medical clearance is still pending. [facility] advised pt is for possible d/c today once pt approved by clinic. Will assist as needed.  ? ?Melven Sartorius ?Renal Navigator ?[phone] ? ?Addendum at 3:58 pm: ?Pt has been accepted at [facility] on TTS schedule. Pt can start tomorrow. Pt will need to arrive tomorrow at 9:30 to complete paperwork prior to 10:30 chair time. Pt's time will change starting on Tuesday. Pt will need to arrive at 10:35 for 10:55 chair time. Met with pt at bedside. Discussed above arrangements. Pt voices understanding and agreeable to plan. Pt provided schedule letter and arrangements added to AVS as well. Offered to call pt's wife to discuss appts but pt declined. Attending and nephrologist made aware of clinic approval. Renal PA aware of clinic's need for orders. Pt will d/c today. Clinic aware pt will start tomorrow.  ?

## 2021-10-19 NOTE — Progress Notes (Signed)
?  Progress Note ? ?VASCULAR SURGERY ASSESSMENT & PLAN:  ? ?POORLY MATURING LEFT UPPER ARM FISTULA:  Pt floor status, no respiratory compromise.  Discussing the risk and benefits of left arm fistulogram for balloon assisted maturation, [place] elected to proceed. ? ?Robert John MD ?2:04 PM ? ? ?10/19/2021 ?2:04 PM ? ?Subjective:  no complaints.  Wants to go home ? ? ?Vitals:  ? 10/19/21 1100 10/19/21 1146  ?BP: (!) 162/82   ?Pulse:    ?Resp: 20   ?Temp: (!) 97.5 ?F (36.4 ?C)   ?SpO2: 96% 91%  ? ?Physical Exam: ?Lungs:  non labored ?Extremities:  palpable thrill near anastomosis; flow is more pulsatile moving up the arm ?Neurologic: A&O ? ?CBC ?   ?Component Value Date/Time  ? WBC 6.0 10/19/2021 0351  ? RBC 2.88 (L) 10/19/2021 0351  ? HGB 9.0 (L) 10/19/2021 0351  ? HCT 27.5 (L) 10/19/2021 0351  ? PLT 95 (L) 10/19/2021 0351  ? MCV 95.5 10/19/2021 0351  ? MCH 31.3 10/19/2021 0351  ? MCHC 32.7 10/19/2021 0351  ? RDW 16.1 (H) 10/19/2021 0351  ? LYMPHSABS 0.8 10/19/2021 0351  ? MONOABS 0.9 10/19/2021 0351  ? EOSABS 0.2 10/19/2021 0351  ? BASOSABS 0.0 10/19/2021 0351  ? ? ?BMET ?   ?Component Value Date/Time  ? NA 134 (L) 10/19/2021 0351  ? K 3.3 (L) 10/19/2021 0351  ? CL 97 (L) 10/19/2021 0351  ? CO2 26 10/19/2021 0351  ? GLUCOSE 88 10/19/2021 0351  ? BUN 18 10/19/2021 0351  ? CREATININE 3.38 (H) 10/19/2021 0351  ? CALCIUM 7.9 (L) 10/19/2021 0351  ? CALCIUM 7.6 (L) 08/26/2021 0050  ? GFRNONAA 17 (L) 10/19/2021 0351  ? GFRAA 26 (L) 05/07/2019 0234  ? ? ?INR ?   ?Component Value Date/Time  ? INR 0.94 04/17/2013 0835  ? ? ? ?Intake/Output Summary (Last 24 hours) at 10/19/2021 1404 ?Last data filed at 10/19/2021 1100 ?Gross per 24 hour  ?Intake 260 ml  ?Output 2500 ml  ?Net -2240 ml  ? ? ?2+ palpable left radial pulse, palpable thrill at the proximal portion of the fistula ? ? ? ?

## 2021-10-19 NOTE — Procedures (Signed)
Pt arrived to the unit with BP of 105/70 no complaints noted.  Permcath dressing intact and occlusive.  Tolerated his dialysis treatment well, final BP  162/82 UF removed 2500  new wt as per bedscale is 69.6kg ?

## 2021-10-19 NOTE — TOC Progression Note (Signed)
Transition of Care (TOC) - Progression Note  ? ? ?Patient Details  ?Name: Robert Lucas ?MRN: [id number] ?Date of Birth: Sep 17, 1940 ? ?Transition of Care (TOC) CM/SW Contact  ?Tom-Grabill, Renea Ee, RN ?Phone Number: ?10/19/2021, 1:32 PM ? ?Clinical Narrative:    ? ?Patient is admitted with Fluid Overload. Lives with wife at [place]. Has a cane, walker and handicapped bathroom.  ?Home health PT recommended. Patient states he had used [facility] services before and would like to resume their care. CM notified Marjory Lies and acceptance voiced. Information on AVS.  ?PCP is [name], NP and uses [facility] mail order and also [facility] in [place]. ?Family to transport at discharge. CM will continue to follow with needs.  ? ?Expected Discharge Plan: [place] ?Barriers to Discharge: Continued Medical Work up ? ?Expected Discharge Plan and Services ?Expected Discharge Plan: [place] ?  ?Discharge Planning Services: CM Consult ?Post Acute Care Choice: Home Health ?Living arrangements for the past 2 months: [place] ([place] ?                ?DME Arranged: N/A ?DME Agency: NA ?  ?  ?  ?HH Arranged: PT ?[place] Agency: [facility] ?Date HH Agency Contacted: 10/19/21 ?Time [place]: 1310 ?Representative spoke with at [place]: Marjory Lies ? ? ?Social Determinants of Health (SDOH) Interventions ?  ? ?Readmission Risk Interventions ?   ? View : No data to display.  ?  ?  ?  ? ? ?

## 2021-10-19 NOTE — TOC Transition Note (Signed)
Transition of Care (TOC) - CM/SW Discharge Note ? ? ?Patient Details  ?Name: Robert Lucas ?MRN: [id number] ?Date of Birth: 02-Jul-1941 ? ?Transition of Care (TOC) CM/SW Contact:  ?Tom-Montalban, Renea Ee, RN ?Phone Number: ?10/19/2021, 4:19 PM ? ? ?Clinical Narrative:    ? ?Patient is scheduled for discharge today. Home health referral with [facility] and info on AVS. Denies any other needs. Family to transport at discharge. No further TOC needs noted. ? ?Final next level of care: [place] ?Barriers to Discharge: Barriers Resolved ? ? ?Patient Goals and CMS Choice ?Patient states their goals for this hospitalization and ongoing recovery are:: To return home ?CMS Medicare.gov Compare Post Acute Care list provided to:: Patient ?Choice offered to / list presented to : Patient ? ?Discharge Placement ?  ?           ?  ?Patient to be transferred to facility by: Family ?  ?  ? ?Discharge Plan and Services ?  ?Discharge Planning Services: CM Consult ?Post Acute Care Choice: Home Health          ?DME Arranged: N/A ?DME Agency: NA ?  ?  ?  ?HH Arranged: PT ?[place] Agency: [facility] ?Date HH Agency Contacted: 10/19/21 ?Time [place]: 1310 ?Representative spoke with at [place]: Marjory Lies ? ?Social Determinants of Health (SDOH) Interventions ?  ? ? ?Readmission Risk Interventions ?   ? View : No data to display.  ?  ?  ?  ? ? ? ? ? ?

## 2021-10-19 NOTE — Op Note (Addendum)
? ? ?  Patient name: Robert Lucas MRN: [id number] DOB: [birth year] Sex: male ? ?10/19/2021 ?Pre-operative Diagnosis: Poorly maturing left brachiocephalic fistula ?Post-operative diagnosis:  Same ?Surgeon:  Broadus John, MD ?Procedure Performed: ?1.  Ultrasound-guided micropuncture access of the left brachiocephalic fistula ?2.  Fistulogram ?3.  Balloon angioplasty 6 x 220 mm balloon angioplasty of the axillary vein, cephalic vein ?Balloon angioplasty-6 x 150 mm drug-coated balloon to the axillary vein, cephalic vein ?4.  Access managed with Monocryl suture ? ? ?Indications: Robert Lucas is an 81 year old male with end-stage renal disease currently undergoing dialysis through a right-sided tunneled IJ line.  I placed a left-sided brachiocephalic fistula in November of last year, which is matured poorly.  It has yet to be accessed.  He presents today for fistulogram possible balloon assisted maturation.  After discussing the risk and benefits, Robert Lucas elected to proceed ? ?Findings: Multiple, tandem, flow-limiting stenoses greater than 80%, in the central portion of the cephalic vein prior to its confluence with the axillary vein. ?Widely patent arterial anastomosis, large branch roughly 4 cm from the anastomosis. ?  ?Procedure: Patient was taken to the Cath Lab and laid in the supine position.  He was prepped and draped in standard fashion a timeout was performed.  Case began with ultrasound-guided micropuncture access of the left-sided brachiocephalic fistula.  From this access, fistulogram was performed.  Fistulogram demonstrated multiple areas of tandem flow-limiting stenosis in the central portion of the cephalic vein prior to its confluence with the axillary vein.  Multiple areas were greater than 80%.  I elected to attempt intervention on this area. ? ?Initially, a 6 mm x 220 mm balloon was brought onto the field and deployed from the subclavian vein into the cephalic vein.  This demonstrated a reasonable  result, therefore I moved to a 6 x 150 mm drug-coated balloon.  This was inflated for 3 minutes spanning from the peripheral portion of the axillary vein into the cephalic vein.  Follow-up angiography demonstrated an excellent result with resolution of flow-limiting stenosis.  Residual stenosis was less than 40%. ? ?I had a long conversation with Robert Lucas regarding the above.  I do think it is reasonable to perform another fistula ultrasound in 2 weeks to assess for improvement versus rebound.  Should rebound occur, will discuss alternate access.  If the fistula is maturing, we will discuss with branch ligation. ? ? ?J. Melene Muller, MD ?[facility] ?Office: [phone] ? ? ? ?

## 2021-10-19 NOTE — Progress Notes (Signed)
Physical Therapy Note ? ?SATURATION QUALIFICATIONS: (This note is used to comply with regulatory documentation for home oxygen) ? ?Patient Saturations on Room Air at Rest = 97% ? ?Patient Saturations on Room Air while Ambulating = 93% ? ?Patient does not require supplemental oxygen to maintain oxygen saturations at acceptable, safe levels with physical activity. ? ? ?Roney Marion, PT  ?Acute Rehabilitation Services ?Office [phone] ? ?

## 2021-10-19 NOTE — Progress Notes (Signed)
Nephrology Follow-Up Consult note ? ? ?Assessment/Recommendations: Robert Lucas is a/an 81 y.o. male with a past medical history significant for chronic systolic and diastolic HF (EF 02-77%, A1OI), CAD s/p PTCA, HTN, T2DM, CKD V who presents with SOB and volume overload. ?  ?New ESRD, Uremia: Presenting with volume overload as well as uremia now deemed ESRD ?-HD catheter placed on 4/15 with IR, appreciate help ?Net 2.5L UF on 4/18 ? ?Seen on HD ?4K bath 2.5L net UF as tolerated ?156/80 ? ?So far net neg 4.6L during this hospitalization. ? ?Awaiting CLIP w/ preference for [facility] -> [facility]. Still awaiting confirmation of seat from renal navigator. ? ?-AVF is not ready to use -> fistulogram today per [facility] but depending on findings may need conversion to AVG.  Appreciate help ? ?From renal standpoint should be able  ? ?-Renally dose medications for iHD ?-Strict I/Os , Daily weights  ?-Maintain MAP>65 for optimal renal perfusion.  ?-Avoid nephrotoxic medications including NSAIDs ?-Use synthetic opioids (Fentanyl/Dilaudid) if needed ? ?Acute hypoxic/hypercapnic respiratory failure: Some pulmonary edema but likely aspiration contributed significantly.  Likely pneumonitis.  Ventilation per primary team.  Dialysis tomorrow, additional UF certainly will help. No cramping with last HD. ?  ?Metabolic Acidosis - improving with hemodialysis.  Stopped oral bicarbonate ?  ?Suspected CAP: Now with possible aspiration component.  Antibiotics per primary team ?  ?Anemia due to CKD: Also with iron deficiency.  Holding IV iron due to concerns for infection.  Hemoglobin most recently 8.9.   ?-Transfuse for Hgb<7 g/dL ?- Loading w/ IV iron (250) + Aranesp 150 (today  w/ hd) ?  ?Chronic systolic and diastolic HF - EF 78-67%, E7MC on most recent ECHO. Volume overloaded, but expect improvement with HD. Can slowly restart GDMT as tolerated given now on dialysis ?  ?Hypertension: ?-BP intermittently low-normal ?-hold home antihypertensives as  needed (currently only on hydralazine PRN) ?  ?Diabetes Mellitus Type 2 with Hyperglycemia: ?-Management per primary ? ?Secondary hyperparathyroidism: On calcitriol.  Started on sevelamer ? ? ?[name] ?[facility] ?10/19/2021 ?9:25 AM ? ?___________________________________________________________ ? ?CC: Swelling ? ?Interval History/Subjective: Breathing certainly better after HD which he is tolerating this am. Comfortable appearing and denies f/c/n/v/ dyspnea. ? ? ?Medications:  ?Current Facility-Administered Medications  ?Medication Dose Route Frequency Provider Last Rate Last Admin  ? acetaminophen (TYLENOL) tablet 650 mg  650 mg Oral Q6H PRN Pauletta Browns, RPH      ? Or  ? acetaminophen (TYLENOL) suppository 650 mg  650 mg Rectal Q6H PRN Pauletta Browns, RPH      ? allopurinol (ZYLOPRIM) tablet 100 mg  100 mg Oral Daily Pauletta Browns, RPH   100 mg at 10/18/21 9470  ? aspirin chewable tablet 81 mg  81 mg Oral Daily Pauletta Browns, RPH   81 mg at 10/18/21 9628  ? atorvastatin (LIPITOR) tablet 40 mg  40 mg Oral QHS Pauletta Browns, RPH   40 mg at 10/18/21 2125  ? calcitRIOL (ROCALTROL) capsule 0.25 mcg  0.25 mcg Oral Q M,W,F Pauletta Browns, RPH   0.25 mcg at 10/17/21 1039  ? Chlorhexidine Gluconate Cloth 2 % PADS 6 each  6 each Topical Q0600 Reesa Chew, MD   6 each at 10/19/21 [phone]  ? Darbepoetin Alfa (ARANESP) injection 150 mcg  150 mcg Intravenous Q Wed-HD Dwana Melena, MD   150 mcg at 10/19/21 [phone]  ? ferric gluconate (FERRLECIT) 250 mg in sodium chloride 0.9 %  250 mL IVPB  250 mg Intravenous Q M,W,F-HD Dwana Melena, MD 135 mL/hr at 10/19/21 0835 250 mg at 10/19/21 0835  ? guaiFENesin-dextromethorphan (ROBITUSSIN DM) 100-10 MG/5ML syrup 5 mL  5 mL Oral Q4H PRN Pauletta Browns, RPH   5 mL at 10/18/21 0247  ? heparin injection 5,000 Units  5,000 Units Subcutaneous Q8H Reesa Chew, MD   5,000 Units at 10/19/21 0263  ? hydrALAZINE (APRESOLINE) injection 10 mg  10 mg  Intravenous Q4H PRN Rise Patience, MD   10 mg at 10/16/21 7858  ? insulin aspart (novoLOG) injection 0-9 Units  0-9 Units Subcutaneous Q4H Margaretha Seeds, MD   1 Units at 10/19/21 0033  ? loperamide HCl (IMODIUM) 1 MG/7.5ML suspension 2 mg  2 mg Oral PRN Pauletta Browns, RPH   2 mg at 10/18/21 1421  ? MEDLINE mouth rinse  15 mL Mouth Rinse BID Allie Bossier, MD   15 mL at 10/18/21 2126  ? ondansetron (ZOFRAN) injection 4 mg  4 mg Intravenous Q6H PRN Etta Quill, DO   4 mg at 10/17/21 1219  ? oxyCODONE-acetaminophen (PERCOCET/ROXICET) 5-325 MG per tablet 1-2 tablet  1-2 tablet Oral Q6H PRN Pauletta Browns, RPH      ? sevelamer carbonate (RENVELA) tablet 800 mg  800 mg Oral TID WC Roney Jaffe, MD   800 mg at 10/18/21 1629  ?  ? ? ?Review of Systems: ?10 systems reviewed and negative except per interval history/subjective ? ?Physical Exam: ?Vitals:  ? 10/19/21 0830 10/19/21 0900  ?BP: (!) 148/81 (!) 156/80  ?Pulse:    ?Resp: (!) 24 16  ?Temp:    ?SpO2:    ? ?No intake/output data recorded. ? ?Intake/Output Summary (Last 24 hours) at 10/19/2021 0925 ?Last data filed at 10/18/2021 1800 ?Gross per 24 hour  ?Intake 260 ml  ?Output --  ?Net 260 ml  ? ?Constitutional: Supine  in bed ?ENMT: ears and nose without scars or lesions, MMM ?CV: normal rate, tr pitting edema in the bilateral lower extremities ?Respiratory: Coarse breath sounds in the bilateral anterior lung fields ?Gastrointestinal: soft, non-tender, minimal distention ?Skin: no visible lesions or rashes ?Psych: Sedated, not interactive, does not answer questioning ?Access: lt BCF, pulsatile at inflow and poor augmentation, RIJ TC ? ?Test Results ?I personally reviewed new and old clinical labs and radiology tests ?Lab Results  ?Component Value Date  ? NA 134 (L) 10/19/2021  ? K 3.3 (L) 10/19/2021  ? CL 97 (L) 10/19/2021  ? CO2 26 10/19/2021  ? BUN 18 10/19/2021  ? CREATININE 3.38 (H) 10/19/2021  ? CALCIUM 7.9 (L) 10/19/2021  ? ALBUMIN 3.0 (L)  10/19/2021  ? PHOS 5.5 (H) 10/19/2021  ? ? ?CBC ?Recent Labs  ?Lab 10/17/21 ?0050 10/18/21 ?0346 10/19/21 ?0351  ?WBC 6.7 5.5 6.0  ?NEUTROABS 5.3 3.8 4.1  ?HGB 9.1* 8.7* 9.0*  ?HCT 27.9* 27.0* 27.5*  ?MCV 95.9 95.7 95.5  ?PLT 83* 88* 95*  ? ? ? ? ? ?

## 2021-10-19 NOTE — Progress Notes (Signed)
PT Cancellation Note ? ?Patient Details ?Name: Robert Lucas ?MRN: [id number] ?DOB: 1941-01-15 ? ? ?Cancelled Treatment:    Reason Eval/Treat Not Completed: Patient at procedure or test/unavailable ? ?Currently in HD; ? ?Will follow up later today as time allows;  ?Otherwise, will follow up for PT tomorrow;  ? ?Noting likely dc today -- while I would like to see him for another O2 walk, given his performance yesterday, I do anticipate he will need home O2;  ?Additionally, he is moving well enough to dc -- doesn't necessarily need to bee seen by PT for dc today; ? ?Thank you,  ?Roney Marion, PT  ?Acute Rehabilitation Services ?Pager [phone] ?Office [phone] ? ?[place] ?10/19/2021, 8:15 AM ?

## 2021-10-19 NOTE — Progress Notes (Signed)
Physical Therapy Treatment ?Patient Details ?Name: Robert Lucas ?MRN: [id number] ?DOB: Sep 11, 1940 ?Today's Date: 10/19/2021 ? ? ?History of Present Illness Pt is 81 yo male admitted on 10/13/21 with worsening creatinine, volume overload, resp distress. Pt intubated 4/16 around 200 am, extubated later that afternoon 1626.  Pt also requiring initiationing of HD.  Pt with hx of CKD stage V, HF, CAD, HTN, DM, OSA, obesity. ? ?  ?PT Comments  ? ? Continuing work on functional mobility and activity tolerance;  session focused on progressive amb with O2 sat monitored, and pt showing excellent improvements;  did not need supplemental O2 to keep O2 sats greaterthan or equal to 92%; to OR for fistual this afternoon  ?Recommendations for follow up therapy are one component of a multi-disciplinary discharge planning process, led by the attending physician.  Recommendations may be updated based on patient status, additional functional criteria and insurance authorization. ? ?Follow Up Recommendations ? Home health PT ?  ?  ?Assistance Recommended at Discharge PRN  ?Patient can return home with the following A little help with walking and/or transfers;A little help with bathing/dressing/bathroom;Help with stairs or ramp for entrance ?  ?Equipment Recommendations ? None recommended by PT  ?  ?Recommendations for Other Services   ? ? ?  ?Precautions / Restrictions Precautions ?Precautions: Fall ?Precaution Comments: Fall risk reduced with use of RW  ?  ? ?Mobility ? Bed Mobility ?Overal bed mobility: Modified Independent ?  ?  ?  ?  ?  ?  ?General bed mobility comments: incr time, no difficulty ?  ? ?Transfers ?Overall transfer level: Needs assistance ?Equipment used: Rolling walker (2 wheels) ?Transfers: Sit to/from Stand ?Sit to Stand: Supervision ?  ?  ?  ?  ?  ?General transfer comment: No physical asssit needed ?  ? ?Ambulation/Gait ?Ambulation/Gait assistance: Supervision ?Gait Distance (Feet): 110 Feet ?Assistive device:  Rolling walker (2 wheels) ?Gait Pattern/deviations: Step-through pattern, Decreased stride length ?  ?  ?  ?General Gait Details: Very nice progress of amb distance; no SOB, no dizziness or nausea ? ? ?Stairs ?  ?  ?  ?  ?  ? ? ?Wheelchair Mobility ?  ? ?Modified Rankin (Stroke Patients Only) ?  ? ? ?  ?Balance   ?  ?Sitting balance-Leahy Scale: Good ?  ?  ?  ?Standing balance-Leahy Scale: Fair ?  ?  ?  ?  ?  ?  ?  ?  ?  ?  ?  ?  ?  ? ?  ?Cognition Arousal/Alertness: Awake/alert ?Behavior During Therapy: [facility] for tasks assessed/performed ?Overall Cognitive Status: Within Functional Limits for tasks assessed ?  ?  ?  ?  ?  ?  ?  ?  ?  ?  ?  ?  ?  ?  ?  ?  ?  ?  ?  ? ?  ?Exercises   ? ?  ?General Comments General comments (skin integrity, edema, etc.): See other PT note of this date ?  ?  ? ?Pertinent Vitals/Pain Pain Assessment ?Pain Assessment: No/denies pain  ? ? ?Home Living   ?  ?  ?  ?  ?  ?  ?  ?  ?  ?   ?  ?Prior Function    ?  ?  ?   ? ?PT Goals (current goals can now be found in the care plan section) Acute Rehab PT Goals ?Patient Stated Goal: return home ?PT Goal Formulation: With patient ?Time For  Goal Achievement: 10/31/21 ?Potential to Achieve Goals: Good ?Progress towards PT goals: Progressing toward goals ? ?  ?Frequency ? ? ? Min 3X/week ? ? ? ?  ?PT Plan Current plan remains appropriate  ? ? ?Co-evaluation   ?  ?  ?  ?  ? ?  ?AM-PAC PT "6 Clicks" Mobility   ?Outcome Measure ? Help needed turning from your back to your side while in a flat bed without using bedrails?: None ?Help needed moving from lying on your back to sitting on the side of a flat bed without using bedrails?: None ?Help needed moving to and from a bed to a chair (including a wheelchair)?: A Little ?Help needed standing up from a chair using your arms (e.g., wheelchair or bedside chair)?: A Little ?Help needed to walk in hospital room?: None ?Help needed climbing 3-5 steps with a railing? : A Little ?6 Click Score: 21 ? ?  ?End of  Session   ?Activity Tolerance: Patient tolerated treatment well ?Patient left: in bed;with call bell/phone within reach ?Nurse Communication: Mobility status ?PT Visit Diagnosis: Other abnormalities of gait and mobility (R26.89);Muscle weakness (generalized) (M62.81) ?  ? ? ?Time: 3903-0092 ?PT Time Calculation (min) (ACUTE ONLY): 20 min ? ?Charges:  $Gait Training: 8-22 mins          ?          ? ?Roney Marion, PT  ?Acute Rehabilitation Services ?Office [phone] ? ? ? ?Robert Lucas ?10/19/2021, 2:21 PM ? ?

## 2021-10-20 ENCOUNTER — Encounter (HOSPITAL_COMMUNITY): Payer: Self-pay | Admitting: Vascular Surgery

## 2021-10-20 DIAGNOSIS — Z992 Dependence on renal dialysis: Secondary | ICD-10-CM | POA: Diagnosis not present

## 2021-10-20 DIAGNOSIS — D689 Coagulation defect, unspecified: Secondary | ICD-10-CM | POA: Diagnosis not present

## 2021-10-20 DIAGNOSIS — D631 Anemia in chronic kidney disease: Secondary | ICD-10-CM | POA: Diagnosis not present

## 2021-10-20 DIAGNOSIS — D509 Iron deficiency anemia, unspecified: Secondary | ICD-10-CM | POA: Diagnosis not present

## 2021-10-20 DIAGNOSIS — N2581 Secondary hyperparathyroidism of renal origin: Secondary | ICD-10-CM | POA: Diagnosis not present

## 2021-10-20 DIAGNOSIS — N186 End stage renal disease: Secondary | ICD-10-CM | POA: Diagnosis not present

## 2021-10-20 DIAGNOSIS — T8249XA Other complication of vascular dialysis catheter, initial encounter: Secondary | ICD-10-CM | POA: Diagnosis not present

## 2021-10-20 NOTE — Discharge Summary (Signed)
Physician Discharge Summary  ?Robert Lucas KPV:[id number] DOB: [birth year] DOA: 10/13/2021 ? ?PCP: [name], NP ? ?Admit date: 10/13/2021 ?Discharge date: 10/20/2021 ? ?Admitted From: Home ?Disposition: Home ? ?Recommendations for Outpatient Follow-up:  ?Follow up with PCP in 1-2 weeks ?Please obtain BMP/CBC in one week ?Please follow up on the following pending results: ? ?Home Health: Yes ?Equipment/Devices: None  ?Discharge Condition: Stable ?CODE STATUS: Full code ?Diet recommendation: Cardiac ?Brief/Interim Summary:81 y.o. WM PMHx chronic systolic and diastolic CHF last EF measured in February 2023 was 30 to 35% with grade 2 diastolic dysfunction, CAD S/P PTCA, CKD stage V admitted in February 2023 with fluid overload improved with diuresis ?Has been having increasing worsening shortness of breath for the last few weeks presently will address denies any associated chest pain.  Has been having some productive cough for the last 2 weeks.  Denies fever chills.  Has been making urine.  Present to the ER at med center. ?  ?ED Course: In the ER patient was short of breath but not hypoxic chest x-ray shows congestion and possible infiltrate.  Patient has been hypothermic in the ER and had consistently remained hypothermic with temperature of 94 ?F.  Lab work show creatinine of 6.95 which has progressively worsened from 4.8 in September 07, 2021 and is around 6.08 in October 05, 2021 that is around a week ago and it is around 6.95.  Potassium is 4.3 bicarb of 16 anion gap of 16.  BNP is 1500 and I sensitive troponin was 30 and 27.  ER physician discussed with on-call nephrologist who advised patient to be placed on 80 mg IV Lasix.  On my exam patient's abdomen appears distended and chest appears mildly rhonchorous.  Patient's blood pressure shortly after I admitted has been in the low normal's.  Patient still remains hypothermic.  Plan is to place patient on Bair hugger and get blood cultures and since chest x-ray does  show infiltrate and patient is having productive cough we will start patient on antibiotics. ?  ? ? ?Discharge Diagnoses:  ?Principal Problem: ?  Fluid overload ?Active Problems: ?  Type 2 diabetes mellitus with hyperlipidemia ([place]) ?  Essential hypertension ?  Acute on chronic systolic CHF (congestive heart failure) ([place]) ?  Acute renal failure superimposed on stage 4 chronic kidney disease ([place]) ?  Hypothermia ?  ARF (acute renal failure) ([place]) ?  OSA (obstructive sleep apnea) ?  Obesity hypoventilation syndrome ([place]) ?  Acute respiratory failure with hypoxia and hypercapnia ([facility]) ?  Acute metabolic encephalopathy ?  Aspiration pneumonia of both lower lobes due to vomit ([place]) ?  Diabetes mellitus type 2 in obese [facility]) ? ?#1 end-stage renal disease/uremia new onset- patient had vascular access placed for UF/dialysis by interventional radiology and then had dialysis.  Patient had fistulogram done 4/ 19 /2023 and was discharged home. ?Patient will follow-up with nephrology and interventional radiology. ?  ?Pt has been accepted at [facility] on TTS schedule. Pt can start tomorrow. Pt will need to arrive tomorrow at 9:30 to complete paperwork prior to 10:30 chair time. Pt's time will change starting on Tuesday. Pt will need to arrive at 10:35 for 10:55 chair time. Met with pt at bedside. Discussed above arrangements. Pt voices understanding and agreeable to plan. Pt provided schedule letter and arrangements added to AVS as well. Offered to call pt's wife to discuss appts but pt declined. Attending and nephrologist made aware of clinic approval. Renal PA aware of clinic's need for orders.  Pt will d/c today. Clinic aware pt will start tomorrow.   ?  ? ?Revision History ? ?#2 type 2 diabetes complicated with nephropathy.  Continue home medications ? ?#3 acute on chronic systolic heart failure with EF 30 to 35%. ? ?#4 altered mental status resolved with dialysis. ? ?Estimated body mass index is 27.18 kg/m? as calculated from the  following: ?  Height as of this encounter: 5' 3"  (1.6 m). ?  Weight as of this encounter: 69.6 kg. ? ?Discharge Instructions ? ?Discharge Instructions   ? ? Call MD for:  difficulty breathing, headache or visual disturbances   Complete by: As directed ?  ? Call MD for:  persistant dizziness or light-headedness   Complete by: As directed ?  ? Call MD for:  persistant nausea and vomiting   Complete by: As directed ?  ? Call MD for:  temperature >100.4   Complete by: As directed ?  ? Diet - low sodium heart healthy   Complete by: As directed ?  ? Diet - low sodium heart healthy   Complete by: As directed ?  ? Increase activity slowly   Complete by: As directed ?  ? Increase activity slowly   Complete by: As directed ?  ? ?  ? ?Allergies as of 10/19/2021   ? ?   Reactions  ? Zestril [lisinopril] Cough  ? Hydrocodone Nausea Only  ? Januvia [sitagliptin] Other (See Comments)  ? Unknown  ? Levitra [vardenafil] Other (See Comments)  ? flushing  ? Prednisone Other (See Comments)  ? Elevates BGL; patient prefers to not take this  ? Victoza [liraglutide] Other (See Comments)  ? GI upset  ? Penicillins Rash, Other (See Comments)  ? Rash around ankles ?Has patient had a PCN reaction causing immediate rash, facial/tongue/throat swelling, SOB or lightheadedness with hypotension: Yes ?Has patient had a PCN reaction causing severe rash involving mucus membranes or skin necrosis: Unk ?Has patient had a PCN reaction that required hospitalization: No ?Has patient had a PCN reaction occurring within the last 10 years: No ?If all of the above answers are "NO", then may proceed with Cephalosporin use.  ? ?  ? ?  ?Medication List  ?  ? ?STOP taking these medications   ? ?amLODipine 5 MG tablet ?Commonly known as: NORVASC ?  ?furosemide 80 MG tablet ?Commonly known as: LASIX ?  ?metolazone 5 MG tablet ?Commonly known as: ZAROXOLYN ?  ?Retacrit 2000 UNIT/ML injection ?Generic drug: epoetin alfa-epbx ?  ? ?  ? ?TAKE these medications    ? ?acetaminophen 500 MG tablet ?Commonly known as: TYLENOL ?Take 500 mg by mouth every 6 (six) hours as needed for moderate pain or headache. ?  ?allopurinol 100 MG tablet ?Commonly known as: ZYLOPRIM ?Take 100 mg by mouth daily. ?  ?aspirin EC 81 MG tablet ?Take 81 mg by mouth every evening. ?  ?atorvastatin 40 MG tablet ?Commonly known as: LIPITOR ?Take 40 mg by mouth at bedtime. ?  ?calcitRIOL 0.25 MCG capsule ?Commonly known as: ROCALTROL ?Take 0.25 mcg by mouth every Monday, Wednesday, and Friday. ?  ?finasteride 5 MG tablet ?Commonly known as: PROSCAR ?Take 5 mg by mouth every evening. ?  ?fluticasone 27.5 MCG/SPRAY nasal spray ?Commonly known as: VERAMYST ?Place 2 sprays into the nose daily as needed for rhinitis. ?  ?hydrALAZINE 50 MG tablet ?Commonly known as: APRESOLINE ?Take 1 tablet (50 mg total) by mouth in the morning and at bedtime. ?  ?hydrOXYzine 10 MG tablet ?  Commonly known as: ATARAX ?Take 10 mg by mouth as needed for anxiety or itching. ?  ?isosorbide dinitrate 20 MG tablet ?Commonly known as: ISORDIL ?Take 1 tablet (20 mg total) by mouth 2 (two) times daily. ?  ?metoprolol succinate 50 MG 24 hr tablet ?Commonly known as: TOPROL-XL ?Take 0.5 tablets (25 mg total) by mouth daily. Take with or immediately following a meal. ?What changed: how much to take ?  ?NovoLOG Mix 70/30 FlexPen (70-30) 100 UNIT/ML FlexPen ?Generic drug: insulin aspart protamine - aspart ?Inject 14-20 Units into the skin daily as needed (high blood sugar). If BS is <180=0 units, If BS is 180-200 units=14 units, If BS is >201=20 units ?  ?sodium bicarbonate 650 MG tablet ?Take 650 mg by mouth 2 (two) times daily. ?  ?tamsulosin 0.4 MG Caps capsule ?Commonly known as: FLOMAX ?Take 1 capsule (0.4 mg total) by mouth daily after supper. ?What changed: when to take this ?  ?VICKS VAPOINHALER IN ?Inhale 1 puff into the lungs daily as needed (congestion). ?  ? ?  ? ? Follow-up Information   ? ? [facility] [place] Follow up.    ?Specialty: Home Health Services ?Why: Someone will call you to schedule first home visit. ?Contact information: ?[address] ?[address] ?[place] [address] ?[phone] ? ? ?  ?  ? ? [name], NP Follow u

## 2021-10-22 DIAGNOSIS — T8249XA Other complication of vascular dialysis catheter, initial encounter: Secondary | ICD-10-CM | POA: Diagnosis not present

## 2021-10-22 DIAGNOSIS — N186 End stage renal disease: Secondary | ICD-10-CM | POA: Diagnosis not present

## 2021-10-22 DIAGNOSIS — D631 Anemia in chronic kidney disease: Secondary | ICD-10-CM | POA: Diagnosis not present

## 2021-10-22 DIAGNOSIS — Z992 Dependence on renal dialysis: Secondary | ICD-10-CM | POA: Diagnosis not present

## 2021-10-22 DIAGNOSIS — N2581 Secondary hyperparathyroidism of renal origin: Secondary | ICD-10-CM | POA: Diagnosis not present

## 2021-10-22 DIAGNOSIS — D509 Iron deficiency anemia, unspecified: Secondary | ICD-10-CM | POA: Diagnosis not present

## 2021-10-22 DIAGNOSIS — D689 Coagulation defect, unspecified: Secondary | ICD-10-CM | POA: Diagnosis not present

## 2021-10-24 DIAGNOSIS — G510 Bell's palsy: Secondary | ICD-10-CM | POA: Diagnosis not present

## 2021-10-24 DIAGNOSIS — G9341 Metabolic encephalopathy: Secondary | ICD-10-CM | POA: Diagnosis not present

## 2021-10-24 DIAGNOSIS — I2510 Atherosclerotic heart disease of native coronary artery without angina pectoris: Secondary | ICD-10-CM | POA: Diagnosis not present

## 2021-10-24 DIAGNOSIS — N179 Acute kidney failure, unspecified: Secondary | ICD-10-CM | POA: Diagnosis not present

## 2021-10-24 DIAGNOSIS — G4733 Obstructive sleep apnea (adult) (pediatric): Secondary | ICD-10-CM | POA: Diagnosis not present

## 2021-10-24 DIAGNOSIS — E7800 Pure hypercholesterolemia, unspecified: Secondary | ICD-10-CM | POA: Diagnosis not present

## 2021-10-24 DIAGNOSIS — M109 Gout, unspecified: Secondary | ICD-10-CM | POA: Diagnosis not present

## 2021-10-24 DIAGNOSIS — N138 Other obstructive and reflux uropathy: Secondary | ICD-10-CM | POA: Diagnosis not present

## 2021-10-24 DIAGNOSIS — N186 End stage renal disease: Secondary | ICD-10-CM | POA: Diagnosis not present

## 2021-10-24 DIAGNOSIS — J690 Pneumonitis due to inhalation of food and vomit: Secondary | ICD-10-CM | POA: Diagnosis not present

## 2021-10-24 DIAGNOSIS — I5023 Acute on chronic systolic (congestive) heart failure: Secondary | ICD-10-CM | POA: Diagnosis not present

## 2021-10-24 DIAGNOSIS — D631 Anemia in chronic kidney disease: Secondary | ICD-10-CM | POA: Diagnosis not present

## 2021-10-24 DIAGNOSIS — I5032 Chronic diastolic (congestive) heart failure: Secondary | ICD-10-CM | POA: Diagnosis not present

## 2021-10-24 DIAGNOSIS — I110 Hypertensive heart disease with heart failure: Secondary | ICD-10-CM | POA: Diagnosis not present

## 2021-10-24 DIAGNOSIS — E1169 Type 2 diabetes mellitus with other specified complication: Secondary | ICD-10-CM | POA: Diagnosis not present

## 2021-10-24 DIAGNOSIS — E1122 Type 2 diabetes mellitus with diabetic chronic kidney disease: Secondary | ICD-10-CM | POA: Diagnosis not present

## 2021-10-24 DIAGNOSIS — K5900 Constipation, unspecified: Secondary | ICD-10-CM | POA: Diagnosis not present

## 2021-10-24 DIAGNOSIS — E8770 Fluid overload, unspecified: Secondary | ICD-10-CM | POA: Diagnosis not present

## 2021-10-24 DIAGNOSIS — J9602 Acute respiratory failure with hypercapnia: Secondary | ICD-10-CM | POA: Diagnosis not present

## 2021-10-24 DIAGNOSIS — J9601 Acute respiratory failure with hypoxia: Secondary | ICD-10-CM | POA: Diagnosis not present

## 2021-10-24 DIAGNOSIS — G709 Myoneural disorder, unspecified: Secondary | ICD-10-CM | POA: Diagnosis not present

## 2021-10-25 DIAGNOSIS — D689 Coagulation defect, unspecified: Secondary | ICD-10-CM | POA: Diagnosis not present

## 2021-10-25 DIAGNOSIS — Z992 Dependence on renal dialysis: Secondary | ICD-10-CM | POA: Diagnosis not present

## 2021-10-25 DIAGNOSIS — N2581 Secondary hyperparathyroidism of renal origin: Secondary | ICD-10-CM | POA: Diagnosis not present

## 2021-10-25 DIAGNOSIS — T8249XA Other complication of vascular dialysis catheter, initial encounter: Secondary | ICD-10-CM | POA: Diagnosis not present

## 2021-10-25 DIAGNOSIS — D509 Iron deficiency anemia, unspecified: Secondary | ICD-10-CM | POA: Diagnosis not present

## 2021-10-25 DIAGNOSIS — N186 End stage renal disease: Secondary | ICD-10-CM | POA: Diagnosis not present

## 2021-10-25 DIAGNOSIS — D631 Anemia in chronic kidney disease: Secondary | ICD-10-CM | POA: Diagnosis not present

## 2021-10-26 DIAGNOSIS — E8770 Fluid overload, unspecified: Secondary | ICD-10-CM | POA: Diagnosis not present

## 2021-10-26 DIAGNOSIS — K5900 Constipation, unspecified: Secondary | ICD-10-CM | POA: Diagnosis not present

## 2021-10-26 DIAGNOSIS — M109 Gout, unspecified: Secondary | ICD-10-CM | POA: Diagnosis not present

## 2021-10-26 DIAGNOSIS — E7800 Pure hypercholesterolemia, unspecified: Secondary | ICD-10-CM | POA: Diagnosis not present

## 2021-10-26 DIAGNOSIS — G510 Bell's palsy: Secondary | ICD-10-CM | POA: Diagnosis not present

## 2021-10-26 DIAGNOSIS — I2510 Atherosclerotic heart disease of native coronary artery without angina pectoris: Secondary | ICD-10-CM | POA: Diagnosis not present

## 2021-10-26 DIAGNOSIS — I5023 Acute on chronic systolic (congestive) heart failure: Secondary | ICD-10-CM | POA: Diagnosis not present

## 2021-10-26 DIAGNOSIS — N138 Other obstructive and reflux uropathy: Secondary | ICD-10-CM | POA: Diagnosis not present

## 2021-10-26 DIAGNOSIS — D631 Anemia in chronic kidney disease: Secondary | ICD-10-CM | POA: Diagnosis not present

## 2021-10-26 DIAGNOSIS — E1169 Type 2 diabetes mellitus with other specified complication: Secondary | ICD-10-CM | POA: Diagnosis not present

## 2021-10-26 DIAGNOSIS — N186 End stage renal disease: Secondary | ICD-10-CM | POA: Diagnosis not present

## 2021-10-26 DIAGNOSIS — N179 Acute kidney failure, unspecified: Secondary | ICD-10-CM | POA: Diagnosis not present

## 2021-10-26 DIAGNOSIS — G4733 Obstructive sleep apnea (adult) (pediatric): Secondary | ICD-10-CM | POA: Diagnosis not present

## 2021-10-26 DIAGNOSIS — G709 Myoneural disorder, unspecified: Secondary | ICD-10-CM | POA: Diagnosis not present

## 2021-10-26 DIAGNOSIS — J690 Pneumonitis due to inhalation of food and vomit: Secondary | ICD-10-CM | POA: Diagnosis not present

## 2021-10-26 DIAGNOSIS — J9601 Acute respiratory failure with hypoxia: Secondary | ICD-10-CM | POA: Diagnosis not present

## 2021-10-26 DIAGNOSIS — G9341 Metabolic encephalopathy: Secondary | ICD-10-CM | POA: Diagnosis not present

## 2021-10-26 DIAGNOSIS — J9602 Acute respiratory failure with hypercapnia: Secondary | ICD-10-CM | POA: Diagnosis not present

## 2021-10-26 DIAGNOSIS — E1122 Type 2 diabetes mellitus with diabetic chronic kidney disease: Secondary | ICD-10-CM | POA: Diagnosis not present

## 2021-10-26 DIAGNOSIS — I5032 Chronic diastolic (congestive) heart failure: Secondary | ICD-10-CM | POA: Diagnosis not present

## 2021-10-26 DIAGNOSIS — I110 Hypertensive heart disease with heart failure: Secondary | ICD-10-CM | POA: Diagnosis not present

## 2021-10-27 ENCOUNTER — Other Ambulatory Visit: Payer: Self-pay | Admitting: *Deleted

## 2021-10-27 DIAGNOSIS — N2581 Secondary hyperparathyroidism of renal origin: Secondary | ICD-10-CM | POA: Diagnosis not present

## 2021-10-27 DIAGNOSIS — N184 Chronic kidney disease, stage 4 (severe): Secondary | ICD-10-CM

## 2021-10-27 DIAGNOSIS — D509 Iron deficiency anemia, unspecified: Secondary | ICD-10-CM | POA: Diagnosis not present

## 2021-10-27 DIAGNOSIS — T8249XA Other complication of vascular dialysis catheter, initial encounter: Secondary | ICD-10-CM | POA: Diagnosis not present

## 2021-10-27 DIAGNOSIS — D689 Coagulation defect, unspecified: Secondary | ICD-10-CM | POA: Diagnosis not present

## 2021-10-27 DIAGNOSIS — Z992 Dependence on renal dialysis: Secondary | ICD-10-CM | POA: Diagnosis not present

## 2021-10-27 DIAGNOSIS — N186 End stage renal disease: Secondary | ICD-10-CM | POA: Diagnosis not present

## 2021-10-27 DIAGNOSIS — D631 Anemia in chronic kidney disease: Secondary | ICD-10-CM | POA: Diagnosis not present

## 2021-10-29 DIAGNOSIS — N2581 Secondary hyperparathyroidism of renal origin: Secondary | ICD-10-CM | POA: Diagnosis not present

## 2021-10-29 DIAGNOSIS — D689 Coagulation defect, unspecified: Secondary | ICD-10-CM | POA: Diagnosis not present

## 2021-10-29 DIAGNOSIS — N186 End stage renal disease: Secondary | ICD-10-CM | POA: Diagnosis not present

## 2021-10-29 DIAGNOSIS — Z992 Dependence on renal dialysis: Secondary | ICD-10-CM | POA: Diagnosis not present

## 2021-10-29 DIAGNOSIS — D509 Iron deficiency anemia, unspecified: Secondary | ICD-10-CM | POA: Diagnosis not present

## 2021-10-29 DIAGNOSIS — T8249XA Other complication of vascular dialysis catheter, initial encounter: Secondary | ICD-10-CM | POA: Diagnosis not present

## 2021-10-29 DIAGNOSIS — D631 Anemia in chronic kidney disease: Secondary | ICD-10-CM | POA: Diagnosis not present

## 2021-10-30 DIAGNOSIS — N186 End stage renal disease: Secondary | ICD-10-CM | POA: Diagnosis not present

## 2021-10-30 DIAGNOSIS — E1129 Type 2 diabetes mellitus with other diabetic kidney complication: Secondary | ICD-10-CM | POA: Diagnosis not present

## 2021-10-30 DIAGNOSIS — Z992 Dependence on renal dialysis: Secondary | ICD-10-CM | POA: Diagnosis not present

## 2021-10-31 DIAGNOSIS — J9601 Acute respiratory failure with hypoxia: Secondary | ICD-10-CM | POA: Diagnosis not present

## 2021-10-31 DIAGNOSIS — Z09 Encounter for follow-up examination after completed treatment for conditions other than malignant neoplasm: Secondary | ICD-10-CM | POA: Diagnosis not present

## 2021-10-31 DIAGNOSIS — N138 Other obstructive and reflux uropathy: Secondary | ICD-10-CM | POA: Diagnosis not present

## 2021-10-31 DIAGNOSIS — I110 Hypertensive heart disease with heart failure: Secondary | ICD-10-CM | POA: Diagnosis not present

## 2021-10-31 DIAGNOSIS — G9341 Metabolic encephalopathy: Secondary | ICD-10-CM | POA: Diagnosis not present

## 2021-10-31 DIAGNOSIS — N186 End stage renal disease: Secondary | ICD-10-CM | POA: Diagnosis not present

## 2021-10-31 DIAGNOSIS — J9602 Acute respiratory failure with hypercapnia: Secondary | ICD-10-CM | POA: Diagnosis not present

## 2021-10-31 DIAGNOSIS — J690 Pneumonitis due to inhalation of food and vomit: Secondary | ICD-10-CM | POA: Diagnosis not present

## 2021-10-31 DIAGNOSIS — I5032 Chronic diastolic (congestive) heart failure: Secondary | ICD-10-CM | POA: Diagnosis not present

## 2021-10-31 DIAGNOSIS — E8770 Fluid overload, unspecified: Secondary | ICD-10-CM | POA: Diagnosis not present

## 2021-10-31 DIAGNOSIS — G510 Bell's palsy: Secondary | ICD-10-CM | POA: Diagnosis not present

## 2021-10-31 DIAGNOSIS — G709 Myoneural disorder, unspecified: Secondary | ICD-10-CM | POA: Diagnosis not present

## 2021-10-31 DIAGNOSIS — D631 Anemia in chronic kidney disease: Secondary | ICD-10-CM | POA: Diagnosis not present

## 2021-10-31 DIAGNOSIS — E1169 Type 2 diabetes mellitus with other specified complication: Secondary | ICD-10-CM | POA: Diagnosis not present

## 2021-10-31 DIAGNOSIS — E7800 Pure hypercholesterolemia, unspecified: Secondary | ICD-10-CM | POA: Diagnosis not present

## 2021-10-31 DIAGNOSIS — G4733 Obstructive sleep apnea (adult) (pediatric): Secondary | ICD-10-CM | POA: Diagnosis not present

## 2021-10-31 DIAGNOSIS — M109 Gout, unspecified: Secondary | ICD-10-CM | POA: Diagnosis not present

## 2021-10-31 DIAGNOSIS — E1122 Type 2 diabetes mellitus with diabetic chronic kidney disease: Secondary | ICD-10-CM | POA: Diagnosis not present

## 2021-10-31 DIAGNOSIS — R0602 Shortness of breath: Secondary | ICD-10-CM | POA: Diagnosis not present

## 2021-10-31 DIAGNOSIS — K5900 Constipation, unspecified: Secondary | ICD-10-CM | POA: Diagnosis not present

## 2021-10-31 DIAGNOSIS — I5023 Acute on chronic systolic (congestive) heart failure: Secondary | ICD-10-CM | POA: Diagnosis not present

## 2021-10-31 DIAGNOSIS — I2510 Atherosclerotic heart disease of native coronary artery without angina pectoris: Secondary | ICD-10-CM | POA: Diagnosis not present

## 2021-10-31 DIAGNOSIS — N179 Acute kidney failure, unspecified: Secondary | ICD-10-CM | POA: Diagnosis not present

## 2021-11-01 DIAGNOSIS — D689 Coagulation defect, unspecified: Secondary | ICD-10-CM | POA: Diagnosis not present

## 2021-11-01 DIAGNOSIS — D509 Iron deficiency anemia, unspecified: Secondary | ICD-10-CM | POA: Diagnosis not present

## 2021-11-01 DIAGNOSIS — N186 End stage renal disease: Secondary | ICD-10-CM | POA: Diagnosis not present

## 2021-11-01 DIAGNOSIS — Z992 Dependence on renal dialysis: Secondary | ICD-10-CM | POA: Diagnosis not present

## 2021-11-01 DIAGNOSIS — T8249XA Other complication of vascular dialysis catheter, initial encounter: Secondary | ICD-10-CM | POA: Diagnosis not present

## 2021-11-01 DIAGNOSIS — N2581 Secondary hyperparathyroidism of renal origin: Secondary | ICD-10-CM | POA: Diagnosis not present

## 2021-11-02 ENCOUNTER — Encounter (HOSPITAL_COMMUNITY): Payer: Medicare Other

## 2021-11-03 DIAGNOSIS — D689 Coagulation defect, unspecified: Secondary | ICD-10-CM | POA: Diagnosis not present

## 2021-11-03 DIAGNOSIS — N2581 Secondary hyperparathyroidism of renal origin: Secondary | ICD-10-CM | POA: Diagnosis not present

## 2021-11-03 DIAGNOSIS — T8249XA Other complication of vascular dialysis catheter, initial encounter: Secondary | ICD-10-CM | POA: Diagnosis not present

## 2021-11-03 DIAGNOSIS — D509 Iron deficiency anemia, unspecified: Secondary | ICD-10-CM | POA: Diagnosis not present

## 2021-11-03 DIAGNOSIS — Z992 Dependence on renal dialysis: Secondary | ICD-10-CM | POA: Diagnosis not present

## 2021-11-03 DIAGNOSIS — N186 End stage renal disease: Secondary | ICD-10-CM | POA: Diagnosis not present

## 2021-11-04 DIAGNOSIS — G709 Myoneural disorder, unspecified: Secondary | ICD-10-CM | POA: Diagnosis not present

## 2021-11-04 DIAGNOSIS — D631 Anemia in chronic kidney disease: Secondary | ICD-10-CM | POA: Diagnosis not present

## 2021-11-04 DIAGNOSIS — M109 Gout, unspecified: Secondary | ICD-10-CM | POA: Diagnosis not present

## 2021-11-04 DIAGNOSIS — N186 End stage renal disease: Secondary | ICD-10-CM | POA: Diagnosis not present

## 2021-11-04 DIAGNOSIS — I110 Hypertensive heart disease with heart failure: Secondary | ICD-10-CM | POA: Diagnosis not present

## 2021-11-04 DIAGNOSIS — K5900 Constipation, unspecified: Secondary | ICD-10-CM | POA: Diagnosis not present

## 2021-11-04 DIAGNOSIS — I5032 Chronic diastolic (congestive) heart failure: Secondary | ICD-10-CM | POA: Diagnosis not present

## 2021-11-04 DIAGNOSIS — J690 Pneumonitis due to inhalation of food and vomit: Secondary | ICD-10-CM | POA: Diagnosis not present

## 2021-11-04 DIAGNOSIS — E7800 Pure hypercholesterolemia, unspecified: Secondary | ICD-10-CM | POA: Diagnosis not present

## 2021-11-04 DIAGNOSIS — G9341 Metabolic encephalopathy: Secondary | ICD-10-CM | POA: Diagnosis not present

## 2021-11-04 DIAGNOSIS — I5023 Acute on chronic systolic (congestive) heart failure: Secondary | ICD-10-CM | POA: Diagnosis not present

## 2021-11-04 DIAGNOSIS — N138 Other obstructive and reflux uropathy: Secondary | ICD-10-CM | POA: Diagnosis not present

## 2021-11-04 DIAGNOSIS — G4733 Obstructive sleep apnea (adult) (pediatric): Secondary | ICD-10-CM | POA: Diagnosis not present

## 2021-11-04 DIAGNOSIS — J9602 Acute respiratory failure with hypercapnia: Secondary | ICD-10-CM | POA: Diagnosis not present

## 2021-11-04 DIAGNOSIS — G510 Bell's palsy: Secondary | ICD-10-CM | POA: Diagnosis not present

## 2021-11-04 DIAGNOSIS — N179 Acute kidney failure, unspecified: Secondary | ICD-10-CM | POA: Diagnosis not present

## 2021-11-04 DIAGNOSIS — E8770 Fluid overload, unspecified: Secondary | ICD-10-CM | POA: Diagnosis not present

## 2021-11-04 DIAGNOSIS — J9601 Acute respiratory failure with hypoxia: Secondary | ICD-10-CM | POA: Diagnosis not present

## 2021-11-04 DIAGNOSIS — E1169 Type 2 diabetes mellitus with other specified complication: Secondary | ICD-10-CM | POA: Diagnosis not present

## 2021-11-04 DIAGNOSIS — I2510 Atherosclerotic heart disease of native coronary artery without angina pectoris: Secondary | ICD-10-CM | POA: Diagnosis not present

## 2021-11-04 DIAGNOSIS — E1122 Type 2 diabetes mellitus with diabetic chronic kidney disease: Secondary | ICD-10-CM | POA: Diagnosis not present

## 2021-11-05 DIAGNOSIS — N186 End stage renal disease: Secondary | ICD-10-CM | POA: Diagnosis not present

## 2021-11-05 DIAGNOSIS — T8249XA Other complication of vascular dialysis catheter, initial encounter: Secondary | ICD-10-CM | POA: Diagnosis not present

## 2021-11-05 DIAGNOSIS — D509 Iron deficiency anemia, unspecified: Secondary | ICD-10-CM | POA: Diagnosis not present

## 2021-11-05 DIAGNOSIS — D689 Coagulation defect, unspecified: Secondary | ICD-10-CM | POA: Diagnosis not present

## 2021-11-05 DIAGNOSIS — Z992 Dependence on renal dialysis: Secondary | ICD-10-CM | POA: Diagnosis not present

## 2021-11-05 DIAGNOSIS — N2581 Secondary hyperparathyroidism of renal origin: Secondary | ICD-10-CM | POA: Diagnosis not present

## 2021-11-07 DIAGNOSIS — E1122 Type 2 diabetes mellitus with diabetic chronic kidney disease: Secondary | ICD-10-CM | POA: Diagnosis not present

## 2021-11-07 DIAGNOSIS — G709 Myoneural disorder, unspecified: Secondary | ICD-10-CM | POA: Diagnosis not present

## 2021-11-07 DIAGNOSIS — J690 Pneumonitis due to inhalation of food and vomit: Secondary | ICD-10-CM | POA: Diagnosis not present

## 2021-11-07 DIAGNOSIS — I5032 Chronic diastolic (congestive) heart failure: Secondary | ICD-10-CM | POA: Diagnosis not present

## 2021-11-07 DIAGNOSIS — J9601 Acute respiratory failure with hypoxia: Secondary | ICD-10-CM | POA: Diagnosis not present

## 2021-11-07 DIAGNOSIS — N179 Acute kidney failure, unspecified: Secondary | ICD-10-CM | POA: Diagnosis not present

## 2021-11-07 DIAGNOSIS — I110 Hypertensive heart disease with heart failure: Secondary | ICD-10-CM | POA: Diagnosis not present

## 2021-11-07 DIAGNOSIS — I5023 Acute on chronic systolic (congestive) heart failure: Secondary | ICD-10-CM | POA: Diagnosis not present

## 2021-11-07 DIAGNOSIS — N186 End stage renal disease: Secondary | ICD-10-CM | POA: Diagnosis not present

## 2021-11-07 DIAGNOSIS — E8770 Fluid overload, unspecified: Secondary | ICD-10-CM | POA: Diagnosis not present

## 2021-11-07 DIAGNOSIS — M109 Gout, unspecified: Secondary | ICD-10-CM | POA: Diagnosis not present

## 2021-11-07 DIAGNOSIS — D631 Anemia in chronic kidney disease: Secondary | ICD-10-CM | POA: Diagnosis not present

## 2021-11-07 DIAGNOSIS — N138 Other obstructive and reflux uropathy: Secondary | ICD-10-CM | POA: Diagnosis not present

## 2021-11-07 DIAGNOSIS — I2510 Atherosclerotic heart disease of native coronary artery without angina pectoris: Secondary | ICD-10-CM | POA: Diagnosis not present

## 2021-11-07 DIAGNOSIS — K5900 Constipation, unspecified: Secondary | ICD-10-CM | POA: Diagnosis not present

## 2021-11-07 DIAGNOSIS — G4733 Obstructive sleep apnea (adult) (pediatric): Secondary | ICD-10-CM | POA: Diagnosis not present

## 2021-11-07 DIAGNOSIS — E1169 Type 2 diabetes mellitus with other specified complication: Secondary | ICD-10-CM | POA: Diagnosis not present

## 2021-11-07 DIAGNOSIS — G510 Bell's palsy: Secondary | ICD-10-CM | POA: Diagnosis not present

## 2021-11-07 DIAGNOSIS — G9341 Metabolic encephalopathy: Secondary | ICD-10-CM | POA: Diagnosis not present

## 2021-11-07 DIAGNOSIS — J9602 Acute respiratory failure with hypercapnia: Secondary | ICD-10-CM | POA: Diagnosis not present

## 2021-11-07 DIAGNOSIS — E7800 Pure hypercholesterolemia, unspecified: Secondary | ICD-10-CM | POA: Diagnosis not present

## 2021-11-08 DIAGNOSIS — N186 End stage renal disease: Secondary | ICD-10-CM | POA: Diagnosis not present

## 2021-11-08 DIAGNOSIS — D509 Iron deficiency anemia, unspecified: Secondary | ICD-10-CM | POA: Diagnosis not present

## 2021-11-08 DIAGNOSIS — N2581 Secondary hyperparathyroidism of renal origin: Secondary | ICD-10-CM | POA: Diagnosis not present

## 2021-11-08 DIAGNOSIS — Z992 Dependence on renal dialysis: Secondary | ICD-10-CM | POA: Diagnosis not present

## 2021-11-08 DIAGNOSIS — T8249XA Other complication of vascular dialysis catheter, initial encounter: Secondary | ICD-10-CM | POA: Diagnosis not present

## 2021-11-08 DIAGNOSIS — D689 Coagulation defect, unspecified: Secondary | ICD-10-CM | POA: Diagnosis not present

## 2021-11-09 DIAGNOSIS — D509 Iron deficiency anemia, unspecified: Secondary | ICD-10-CM | POA: Diagnosis not present

## 2021-11-09 DIAGNOSIS — D631 Anemia in chronic kidney disease: Secondary | ICD-10-CM | POA: Diagnosis not present

## 2021-11-09 DIAGNOSIS — Z992 Dependence on renal dialysis: Secondary | ICD-10-CM | POA: Diagnosis not present

## 2021-11-09 DIAGNOSIS — D689 Coagulation defect, unspecified: Secondary | ICD-10-CM | POA: Diagnosis not present

## 2021-11-09 DIAGNOSIS — T8249XA Other complication of vascular dialysis catheter, initial encounter: Secondary | ICD-10-CM | POA: Diagnosis not present

## 2021-11-09 DIAGNOSIS — N186 End stage renal disease: Secondary | ICD-10-CM | POA: Diagnosis not present

## 2021-11-09 DIAGNOSIS — N2581 Secondary hyperparathyroidism of renal origin: Secondary | ICD-10-CM | POA: Diagnosis not present

## 2021-11-10 NOTE — Progress Notes (Signed)
?POST OPERATIVE OFFICE NOTE ? ? ? ?CC:  F/u for surgery ? ?HPI:  This is a 81 y.o. male who is s/p LEFT brachiocephalic fistula creation on 05/17/21.  Most recently, Stevie underwent balloon assisted maturation of his left brachiocephalic fistula. He presents today for follow up by his wife.  Since his balloon anoplasty, [name] has been doing well.  No complaints, no symptoms concerning for steal syndrome. ? ? ? ?Allergies  ?Allergen Reactions  ? Zestril [Lisinopril] Cough  ? Hydrocodone Nausea Only  ? Januvia [Sitagliptin] Other (See Comments)  ?  Unknown  ? Levitra [Vardenafil] Other (See Comments)  ?  flushing  ? Prednisone Other (See Comments)  ?  Elevates BGL; patient prefers to not take this  ? Victoza [Liraglutide] Other (See Comments)  ?  GI upset  ? Penicillins Rash and Other (See Comments)  ?  Rash around ankles ?Has patient had a PCN reaction causing immediate rash, facial/tongue/throat swelling, SOB or lightheadedness with hypotension: Yes ?Has patient had a PCN reaction causing severe rash involving mucus membranes or skin necrosis: Unk ?Has patient had a PCN reaction that required hospitalization: No ?Has patient had a PCN reaction occurring within the last 10 years: No ?If all of the above answers are "NO", then may proceed with Cephalosporin use. ?  ? ? ?Current Outpatient Medications  ?Medication Sig Dispense Refill  ? acetaminophen (TYLENOL) 500 MG tablet Take 500 mg by mouth every 6 (six) hours as needed for moderate pain or headache.    ? allopurinol (ZYLOPRIM) 100 MG tablet Take 100 mg by mouth daily.    ? Aromatic Inhalants (VICKS VAPOINHALER IN) Inhale 1 puff into the lungs daily as needed (congestion).    ? aspirin EC 81 MG tablet Take 81 mg by mouth every evening.    ? atorvastatin (LIPITOR) 40 MG tablet Take 40 mg by mouth at bedtime.    ? calcitRIOL (ROCALTROL) 0.25 MCG capsule Take 0.25 mcg by mouth every Monday, Wednesday, and Friday.    ? finasteride (PROSCAR) 5 MG tablet Take 5 mg by  mouth every evening.    ? fluticasone (VERAMYST) 27.5 MCG/SPRAY nasal spray Place 2 sprays into the nose daily as needed for rhinitis.    ? hydrALAZINE (APRESOLINE) 50 MG tablet Take 1 tablet (50 mg total) by mouth in the morning and at bedtime. 180 tablet 3  ? hydrOXYzine (ATARAX) 10 MG tablet Take 10 mg by mouth as needed for anxiety or itching.    ? insulin aspart protamine - aspart (NOVOLOG MIX 70/30 FLEXPEN) (70-30) 100 UNIT/ML FlexPen Inject 14-20 Units into the skin daily as needed (high blood sugar). If BS is <180=0 units, If BS is 180-200 units=14 units, If BS is >201=20 units    ? isosorbide dinitrate (ISORDIL) 20 MG tablet Take 1 tablet (20 mg total) by mouth 2 (two) times daily. 180 tablet 3  ? metoprolol succinate (TOPROL-XL) 50 MG 24 hr tablet Take 0.5 tablets (25 mg total) by mouth daily. Take with or immediately following a meal. 30 tablet 0  ? sodium bicarbonate 650 MG tablet Take 650 mg by mouth 2 (two) times daily.    ? tamsulosin (FLOMAX) 0.4 MG CAPS capsule Take 1 capsule (0.4 mg total) by mouth daily after supper. (Patient taking differently: Take 0.4 mg by mouth at bedtime.) 30 capsule 0  ? ?No current facility-administered medications for this visit.  ? ? ? ROS:  See HPI ? ?Physical Exam: ? ? ?OUTFLOW VEIN  PSV     Diameter  Depth (cm)            Describe        ?      ?             (cm/s)      (cm)                                           ?      ?+------------+---------+------------+-----------+--------------------------  ?----+  ?Shoulder       156       0.53       0.77                                ?      ?+------------+---------+------------+-----------+--------------------------  ?----+  ?Prox UA        143       0.37       0.32                                ?      ?+------------+---------+------------+-----------+--------------------------  ?----+  ?Mid UA         163       0.37       0.40    competing branch and 0.15  ?169   ?                                                           cm            ?      ?+------------+---------+------------+-----------+--------------------------  ?----+  ?Dist UA     655 / 4350.26 / 0.43 0.32 / 0.44      change in Diameter    ?      ?+------------+---------+------------+-----------+--------------------------  ?----+  ?AC Fossa    464 / 2650.30 / 0.60 0.46 / 0.38                            ?      ?+------------+---------+------------+-----------+--------------------------  ?----+  ?   ?Summary:  ?Patent left brachio-cephalic AVF.  ?No stenosis.  ?Branch in the distal upper arm 0.3cm.  ?Largest outflow diameter of 0.45cm.  ?   ? ? ?Incision:  well healed ?Extremities:  palpable thrill and radial pulse left UE ?Left UE N/V/M intact ? ? ?Assessment/Plan:  This is a 81 y.o. male who is s/p: Left brachial cephalic av fistula, balloon assisted fistula maturation.  The proximal portion of the cephalic vein has improved luminal diameter, however the fistula is still small at the mid humerus. ? ?I had a long conversation with him regarding the above.  There is a large branch distally that can be ligated, and would improve blood flow through the fistula.  The patient is on dialysis, I gave him the option of branch ligation versus new fistula creation.  After discussing the risk and benefits, he elected to pursue left-sided fistula branch ligation in an effort to further mature the fistula  as it is close to size criteria for use. ? ?There is aware that should this not improve the luminal diameter of his fistula over the next month, he would likely require new access. ? ? ?Robert Lucas ?[facility] ?[phone] ? ?

## 2021-11-11 ENCOUNTER — Other Ambulatory Visit: Payer: Self-pay

## 2021-11-11 ENCOUNTER — Ambulatory Visit (INDEPENDENT_AMBULATORY_CARE_PROVIDER_SITE_OTHER): Payer: Medicare Other | Admitting: Vascular Surgery

## 2021-11-11 ENCOUNTER — Ambulatory Visit (HOSPITAL_COMMUNITY)
Admission: RE | Admit: 2021-11-11 | Discharge: 2021-11-11 | Disposition: A | Discharge status: Home / Self Care | Payer: Medicare Other | Source: Ambulatory Visit | Attending: Vascular Surgery | Admitting: Vascular Surgery

## 2021-11-11 ENCOUNTER — Encounter (HOSPITAL_COMMUNITY): Payer: Medicare Other

## 2021-11-11 ENCOUNTER — Encounter: Payer: Self-pay | Admitting: Vascular Surgery

## 2021-11-11 VITALS — BP 152/76 | HR 74 | Temp 98.0°F | Resp 20 | Ht 63.0 in | Wt 154.3 lb

## 2021-11-11 DIAGNOSIS — T8249XA Other complication of vascular dialysis catheter, initial encounter: Secondary | ICD-10-CM | POA: Diagnosis not present

## 2021-11-11 DIAGNOSIS — N186 End stage renal disease: Secondary | ICD-10-CM | POA: Diagnosis not present

## 2021-11-11 DIAGNOSIS — D689 Coagulation defect, unspecified: Secondary | ICD-10-CM | POA: Diagnosis not present

## 2021-11-11 DIAGNOSIS — D509 Iron deficiency anemia, unspecified: Secondary | ICD-10-CM | POA: Diagnosis not present

## 2021-11-11 DIAGNOSIS — D631 Anemia in chronic kidney disease: Secondary | ICD-10-CM | POA: Diagnosis not present

## 2021-11-11 DIAGNOSIS — Z992 Dependence on renal dialysis: Secondary | ICD-10-CM

## 2021-11-11 DIAGNOSIS — N184 Chronic kidney disease, stage 4 (severe): Secondary | ICD-10-CM | POA: Diagnosis not present

## 2021-11-11 DIAGNOSIS — N2581 Secondary hyperparathyroidism of renal origin: Secondary | ICD-10-CM | POA: Diagnosis not present

## 2021-11-14 DIAGNOSIS — D509 Iron deficiency anemia, unspecified: Secondary | ICD-10-CM | POA: Diagnosis not present

## 2021-11-14 DIAGNOSIS — N186 End stage renal disease: Secondary | ICD-10-CM | POA: Diagnosis not present

## 2021-11-14 DIAGNOSIS — T8249XA Other complication of vascular dialysis catheter, initial encounter: Secondary | ICD-10-CM | POA: Diagnosis not present

## 2021-11-14 DIAGNOSIS — D689 Coagulation defect, unspecified: Secondary | ICD-10-CM | POA: Diagnosis not present

## 2021-11-14 DIAGNOSIS — D631 Anemia in chronic kidney disease: Secondary | ICD-10-CM | POA: Diagnosis not present

## 2021-11-14 DIAGNOSIS — Z992 Dependence on renal dialysis: Secondary | ICD-10-CM | POA: Diagnosis not present

## 2021-11-14 DIAGNOSIS — N2581 Secondary hyperparathyroidism of renal origin: Secondary | ICD-10-CM | POA: Diagnosis not present

## 2021-11-14 NOTE — Addendum Note (Signed)
Addended by: Nicholas Lose on: 11/14/2021 09:38 AM ? ? Modules accepted: Orders ? ?

## 2021-11-16 DIAGNOSIS — N2581 Secondary hyperparathyroidism of renal origin: Secondary | ICD-10-CM | POA: Diagnosis not present

## 2021-11-16 DIAGNOSIS — D631 Anemia in chronic kidney disease: Secondary | ICD-10-CM | POA: Diagnosis not present

## 2021-11-16 DIAGNOSIS — Z992 Dependence on renal dialysis: Secondary | ICD-10-CM | POA: Diagnosis not present

## 2021-11-16 DIAGNOSIS — T8249XA Other complication of vascular dialysis catheter, initial encounter: Secondary | ICD-10-CM | POA: Diagnosis not present

## 2021-11-16 DIAGNOSIS — D509 Iron deficiency anemia, unspecified: Secondary | ICD-10-CM | POA: Diagnosis not present

## 2021-11-16 DIAGNOSIS — D689 Coagulation defect, unspecified: Secondary | ICD-10-CM | POA: Diagnosis not present

## 2021-11-16 DIAGNOSIS — N186 End stage renal disease: Secondary | ICD-10-CM | POA: Diagnosis not present

## 2021-11-18 DIAGNOSIS — D689 Coagulation defect, unspecified: Secondary | ICD-10-CM | POA: Diagnosis not present

## 2021-11-18 DIAGNOSIS — D631 Anemia in chronic kidney disease: Secondary | ICD-10-CM | POA: Diagnosis not present

## 2021-11-18 DIAGNOSIS — N2581 Secondary hyperparathyroidism of renal origin: Secondary | ICD-10-CM | POA: Diagnosis not present

## 2021-11-18 DIAGNOSIS — T8249XA Other complication of vascular dialysis catheter, initial encounter: Secondary | ICD-10-CM | POA: Diagnosis not present

## 2021-11-18 DIAGNOSIS — N186 End stage renal disease: Secondary | ICD-10-CM | POA: Diagnosis not present

## 2021-11-18 DIAGNOSIS — D509 Iron deficiency anemia, unspecified: Secondary | ICD-10-CM | POA: Diagnosis not present

## 2021-11-18 DIAGNOSIS — Z992 Dependence on renal dialysis: Secondary | ICD-10-CM | POA: Diagnosis not present

## 2021-11-21 ENCOUNTER — Other Ambulatory Visit: Payer: Self-pay

## 2021-11-21 ENCOUNTER — Encounter (HOSPITAL_COMMUNITY): Payer: Self-pay | Admitting: Vascular Surgery

## 2021-11-21 DIAGNOSIS — D509 Iron deficiency anemia, unspecified: Secondary | ICD-10-CM | POA: Diagnosis not present

## 2021-11-21 DIAGNOSIS — Z992 Dependence on renal dialysis: Secondary | ICD-10-CM | POA: Diagnosis not present

## 2021-11-21 DIAGNOSIS — D689 Coagulation defect, unspecified: Secondary | ICD-10-CM | POA: Diagnosis not present

## 2021-11-21 DIAGNOSIS — D631 Anemia in chronic kidney disease: Secondary | ICD-10-CM | POA: Diagnosis not present

## 2021-11-21 DIAGNOSIS — N186 End stage renal disease: Secondary | ICD-10-CM | POA: Diagnosis not present

## 2021-11-21 DIAGNOSIS — T8249XA Other complication of vascular dialysis catheter, initial encounter: Secondary | ICD-10-CM | POA: Diagnosis not present

## 2021-11-21 DIAGNOSIS — N2581 Secondary hyperparathyroidism of renal origin: Secondary | ICD-10-CM | POA: Diagnosis not present

## 2021-11-21 NOTE — Progress Notes (Signed)
Mr. Robert Lucas denies chest pain or shortness of breath.  Patient denies having any s/s of Covid in his household.  Patient denies any known exposure to Covid.   Mr. Robert Lucas has type II diabetes, he reports that fasting CBGs average 136.  I instructed Mr. Robert Lucas to not take 70/30/ Insulin in am. Check CBG upon awakening and every 2 hours until leaving to come to the hospital. If CBG is  less than 70; treat with 4 Glucose tablets or 1 tube of Glucose Gel.  Recheck CBG in 15 minutes and call pre op desk - [phone] for further instructions.

## 2021-11-21 NOTE — Progress Notes (Incomplete)
Anesthesia Chart Review: Robert Lucas  Case: [id number] Date/Time: 11/22/21 0715   Procedure: LEFT ARM FISTULA BRANCH LIGATION (Left)   Anesthesia type: Choice   Pre-op diagnosis: ESRD   Location: [facility] OR ROOM 09 / [place] OR   Surgeons: Broadus John, MD       DISCUSSION: Patient is an 81 year old male scheduled for the above procedure. He is s/p left radiocephalic AVF creation on 96/75/91. He was admitted to [facility] 10/13/21-10/2021 with volume overload in setting of acute on chronic CHF/renal failure progressing to ESRD. S/p right IJ TDC placement by IR 10/15/21 and was started on hemodialysis. In the early morning of 10/16/21 patient vomited and given Zofran. The RN left room to get the Dinamap and on return the patient's lips were cyanotic with O2 sat 60%, placed on NRB with sats up to 93% but lethargic. He was transferred to ICU and intubated with concern for aspiration pneumonitis. He underwent another day of HD on 10/16/21 and by late afternoon was extubated.  His previously placed AVF was noted to be poorly maturing, and he underwent balloon angioplasty left axillary and cephalic veins 6/38/46. He was discharged home later that day with plans to start outpatient HD at [facility] on TTS schedule.     He had vascular surgery follow-up with Dr. Unk Lightning on 11/11/21 to reassess AVF. The proximal portion of the cephalic vain had improved luminal diameter, but it was still small at the mid humerus. Patient opted to proceed with branch ligation first in hopes AVF will mature and could avoid a new AVF creation. Continue ASA per VVS.   Other history includes former smoker (quit 07/03/88), post-operative N/V, HTN, MI (s/p TPA and PTCA LAD '90), HFrEF, DM2, ESRD (started HD 10/16/21), hypercholesterolemia, Bell's palsy, back surgery, osteoarthritis (right THA 05/06/19).  Last cardiology visit with Dr. Virgina Jock was on 09/21/21. LVEF 30-35% by 08/26/21 echo (previously EF 45-50% 11/15/20, 20-25% 07/10/20, 55% 02/05/19, 50%  10/30/12). Unclear etiology of LV dysfunction, although 09/09/20 nuclear stress test suggestive of ischemic cardiomyopathy. He has been treated medically since he has been asymptomatic (no angina) and with advanced kidney disease. S/p left AVF creation 05/17/21 under MAC and supraclavicular block. He underwent angioplasty of AVF on 10/19/21.  He just started on dialysis last month, and has follow-up with Dr. Virgina Jock next on 12/23/21.  He remains on ASA, atorvastatin, hydralazine, Isordil, Toprol XL. Also on Flomax, Novolog 70/30, Neurontin, Proscar, and allopurinol.    11/16/21 HD center notes in [place] indicate knowledge that procedure is scheduled for 11/22/21. Review of HD sessions last week were on a MWF schedule. Anesthesia team to evaluate on the day of surgery. Case is posted for Choice.    VS:  BP Readings from Last 3 Encounters:  11/11/21 (!) 152/76  10/19/21 (!) 162/82  10/05/21 (!) 132/55   Pulse Readings from Last 3 Encounters:  11/11/21 74  10/19/21 (!) 0  10/05/21 72     PROVIDERS: [name], NP is PCP   Vernell Leep, MD is Cardiologist   Pearson Grippe, MD is nephrologist   LABS: For day of surgery. As of 11/16/21 (Care Everywhere) H/H 10.9/32.7. Last PLT count 10/19/21 95K. , PLT 10/19/21, H/H 9.0/27.5, PLT 95K (PLT   IMAGES:   EKG:  EKG 10/14/21 03:11:23: Sinus bradycardia at 53 bpm Rightward axis Anteroseptal infarct , age undetermined ST & T wave abnormality, consider inferolateral ischemia Abnormal ECG When compared with ECG of 14-Oct-2021 03:10, No significant change since last tracing  Confirmed by Mertie Moores [phone]) on 10/14/2021 5:39:26 PM - Diffuse baseline wanderer noted. See previously tracing at 03:10:34.  EKG 10/14/21 03:10:34 Sinus bradycardia Rightward axis Anteroseptal infarct , age undetermined Abnormal ECG When compared with ECG of 13-Oct-2021 15:12, No significant change since last tracing Confirmed by Mertie Moores  [phone]) on 10/14/2021 5:39:10 PM   CV: Echo 08/26/21: IMPRESSIONS   1. Left ventricular ejection fraction, by estimation, is 30 to 35%. The  left ventricle has moderately decreased function. The left ventricle has  no regional wall motion abnormalities. The left ventricular internal  cavity size was mildly dilated. Left  ventricular diastolic parameters are consistent with Grade II diastolic  dysfunction (pseudonormalization).   2. Right ventricular systolic function is normal. The right ventricular  size is normal. There is severely elevated pulmonary artery systolic  pressure. The estimated right ventricular systolic pressure is 03.5 mmHg.   3. Left atrial size was moderately dilated.   4. The mitral valve is normal in structure. No evidence of mitral valve  regurgitation. No evidence of mitral stenosis.   5. Tricuspid valve regurgitation is mild to moderate.   6. The aortic valve is tricuspid. There is mild calcification of the  aortic valve. There is mild thickening of the aortic valve. Aortic valve  regurgitation is not visualized. Aortic valve sclerosis is present, with  no evidence of aortic valve stenosis.   7. The inferior vena cava is dilated in size with <50% respiratory  variability, suggesting right atrial pressure of 15 mmHg.  - Comparison: Echo 11/15/20 LVEF 45-50%,  with global hypokinesis, grade II DD, mild MR/TR, estimated PASP 41 mmHg; Echo 07/10/20 LVEF 20-25%, mild LVH, grade I DD, severely dilated LA; Echo 02/05/19 LVEF 55%; Echo 10/30/12 LVEF 50%, anteroseptal and apical hypokinesis  Lexiscan Tetrofosmin Stress Test 09/06/2020: Nondiagnostic ECG stress. Raw images reveal diaphragmatic attenuation in the inferior wall.  Scar in this region cannot be excluded.  There is a medium-sized defect in the inferoseptal region suggestive of mild reversible ischemia. There is a moderate sized severe defect in the anterior and antero-apical region suggestive of scar with minimal  peri-infarct ischemia at the apex. The left ventricle is dilated both in rest and stress images. Moderately enlarged left ventricle.  LV rest volume 214 ml.  LV stress volume 192 ml. No stress lung uptake. TID is normal at 0.9. The right ventricle is normal. There is global hypokinesis. Stress LV EF is moderately dysfunctional 31%.  No previous exam available for comparison. High risk in view of low LVEF. Study suggests ischemic cardiomyopathy. - Per Dr. Virgina Jock on 09/09/20, "Discussed test results with the patient, which is likely to present ischemic cardiomyopathy.  Patient has no angina symptoms at this time.  His exertional dyspnea has also improved.  I explained to the patient that logical neck step would be to perform coronary angiogram to evaluate for obstructive CAD, followed by appropriate intervention PCI or CABG.  However, in absence of symptoms and presence of tenuous renal function, patient requests to pursue conservative management."   Per previous anesthesia APP note for date of service 04/30/17, his cardiac testing done through Dr. Wynne Dust included:   - Treadmill Cardiolite 10/17/12:  Impression: 1. Abnormal stress Myoview study with evidence of previous anterioapical infarction but no evidence of ischemia. 2. Technically difficult quantitative gated SPECT analysis with evidence of anterior apical hypokinesis but technically difficult ejection fraction which appears to be artificially low (calculated EF 26% but visually estimated ~ 50%). 3. Good  exercise capacity with no evidence no EKG evidence of ischemia. Recommendations: Continue medical therapy, evaluate echocardiogram to confirm ejection fraction.   - Echo 10/30/12:  Conclusion: 1. Lower limits of normal left ventricle with anteroseptal and apical hypokinesis, EF 50%. 2. Mild left atrial enlargement. 3. Doppler evidence of grade 1 diastolic dysfunction.   - Cardiac cath 08/14/88: "normal Left main, 99% stenosis  proximal LAD, no significant disease CFX, RCA, PTCA of LAD done."  Past Medical History:  Diagnosis Date  . Arthritis   . Bell's palsy    one episode  . CKD (chronic kidney disease)    sees [place] in [place]  . Diabetes ([place])    type 2   sees endo at [place] in [place]  . Hypercholesteremia   . Hypertension   . Myocardial infarction ([place]) 1990  . Neuromuscular disorder ([place])   . PONV (postoperative nausea and vomiting)     Past Surgical History:  Procedure Laterality Date  . AV FISTULA PLACEMENT Left 05/17/2021   Procedure: LEFT ARM ARTERIOVENOUS (AV) FISTULA CREATION;  Surgeon: Broadus John, MD;  Location: [facility];  Service: Vascular;  Laterality: Left;  PERIPHERAL NERVE BLOCK  . BACK SURGERY  yrs ago   lower  . CARDIAC CATHETERIZATION    . CATARACT EXTRACTION Right 01/2021  . CHOLECYSTECTOMY N/A 05/02/2017   Procedure: LAPAROSCOPIC CHOLECYSTECTOMY;  Surgeon: Ralene Ok, MD;  Location: [facility];  Service: General;  Laterality: N/A;  . CORONARY ANGIOPLASTY     2 1990  . EYE SURGERY Left 03/2021   cataract removal  . IR FLUORO GUIDE CV LINE RIGHT  10/15/2021  . IR US GUIDE VASC ACCESS RIGHT  10/15/2021  . KNEE SURGERY Left yrs ago   arthroscopic  . PERIPHERAL VASCULAR BALLOON ANGIOPLASTY  10/19/2021   Procedure: PERIPHERAL VASCULAR BALLOON ANGIOPLASTY;  Surgeon: Broadus John, MD;  Location: [place] [facility];  Service: Cardiovascular;;  Left AVF  . ROTATOR CUFF REPAIR Left yrs ago  . SHOULDER OPEN ROTATOR CUFF REPAIR Right 04/23/2013   Procedure: RIGHT SHOULDER ROTATOR CUFF REPAIR WITH GRAFT AND ANCHORS ;  Surgeon: Tobi Bastos, MD;  Location: [facility];  Service: Orthopedics;  Laterality: Right;  . TONSILLECTOMY    . TOTAL HIP ARTHROPLASTY Right 05/06/2019   Procedure: TOTAL HIP ARTHROPLASTY ANTERIOR APPROACH;  Surgeon: Paralee Cancel, MD;  Location: [facility];  Service: Orthopedics;  Laterality: Right;  70 mins    MEDICATIONS: No current facility-administered  medications for this encounter.   Marland Kitchen acetaminophen (TYLENOL) 500 MG tablet  . allopurinol (ZYLOPRIM) 100 MG tablet  . aspirin EC 81 MG tablet  . atorvastatin (LIPITOR) 40 MG tablet  . finasteride (PROSCAR) 5 MG tablet  . fluticasone (FLONASE) 50 MCG/ACT nasal spray  . gabapentin (NEURONTIN) 100 MG capsule  . hydrALAZINE (APRESOLINE) 50 MG tablet  . insulin aspart protamine - aspart (NOVOLOG MIX 70/30 FLEXPEN) (70-30) 100 UNIT/ML FlexPen  . isosorbide dinitrate (ISORDIL) 20 MG tablet  . metoprolol succinate (TOPROL-XL) 50 MG 24 hr tablet  . tamsulosin (FLOMAX) 0.4 MG CAPS capsule

## 2021-11-22 ENCOUNTER — Encounter (HOSPITAL_COMMUNITY): Payer: Self-pay | Admitting: Vascular Surgery

## 2021-11-22 ENCOUNTER — Ambulatory Visit (HOSPITAL_COMMUNITY)
Admission: RE | Admit: 2021-11-22 | Discharge: 2021-11-22 | Disposition: A | Discharge status: Home / Self Care | Payer: Medicare Other | Source: Ambulatory Visit | Attending: Vascular Surgery | Admitting: Vascular Surgery

## 2021-11-22 ENCOUNTER — Encounter (HOSPITAL_COMMUNITY)
Admission: RE | Disposition: A | Discharge status: Home / Self Care | Payer: Self-pay | Source: Ambulatory Visit | Attending: Vascular Surgery

## 2021-11-22 ENCOUNTER — Ambulatory Visit (HOSPITAL_COMMUNITY): Payer: Medicare Other | Admitting: Vascular Surgery

## 2021-11-22 ENCOUNTER — Ambulatory Visit (HOSPITAL_BASED_OUTPATIENT_CLINIC_OR_DEPARTMENT_OTHER): Payer: Medicare Other | Admitting: Vascular Surgery

## 2021-11-22 ENCOUNTER — Other Ambulatory Visit: Payer: Self-pay

## 2021-11-22 DIAGNOSIS — Z992 Dependence on renal dialysis: Secondary | ICD-10-CM | POA: Insufficient documentation

## 2021-11-22 DIAGNOSIS — I509 Heart failure, unspecified: Secondary | ICD-10-CM | POA: Insufficient documentation

## 2021-11-22 DIAGNOSIS — I272 Pulmonary hypertension, unspecified: Secondary | ICD-10-CM | POA: Diagnosis not present

## 2021-11-22 DIAGNOSIS — Z87891 Personal history of nicotine dependence: Secondary | ICD-10-CM | POA: Diagnosis not present

## 2021-11-22 DIAGNOSIS — I252 Old myocardial infarction: Secondary | ICD-10-CM | POA: Diagnosis not present

## 2021-11-22 DIAGNOSIS — E1122 Type 2 diabetes mellitus with diabetic chronic kidney disease: Secondary | ICD-10-CM | POA: Insufficient documentation

## 2021-11-22 DIAGNOSIS — I132 Hypertensive heart and chronic kidney disease with heart failure and with stage 5 chronic kidney disease, or end stage renal disease: Secondary | ICD-10-CM

## 2021-11-22 DIAGNOSIS — N186 End stage renal disease: Secondary | ICD-10-CM | POA: Diagnosis not present

## 2021-11-22 DIAGNOSIS — I2510 Atherosclerotic heart disease of native coronary artery without angina pectoris: Secondary | ICD-10-CM

## 2021-11-22 DIAGNOSIS — N185 Chronic kidney disease, stage 5: Secondary | ICD-10-CM | POA: Diagnosis not present

## 2021-11-22 DIAGNOSIS — I739 Peripheral vascular disease, unspecified: Secondary | ICD-10-CM | POA: Insufficient documentation

## 2021-11-22 HISTORY — DX: Heart failure, unspecified: I50.9

## 2021-11-22 HISTORY — DX: Pneumonia, unspecified organism: J18.9

## 2021-11-22 HISTORY — PX: LIGATION OF COMPETING BRANCHES OF ARTERIOVENOUS FISTULA: SHX5949

## 2021-11-22 HISTORY — DX: Atherosclerotic heart disease of native coronary artery without angina pectoris: I25.10

## 2021-11-22 LAB — POCT I-STAT, CHEM 8
BUN: 24 mg/dL — ABNORMAL HIGH (ref 8–23)
Calcium, Ion: 1.11 mmol/L — ABNORMAL LOW (ref 1.15–1.40)
Chloride: 98 mmol/L (ref 98–111)
Creatinine, Ser: 3.7 mg/dL — ABNORMAL HIGH (ref 0.61–1.24)
Glucose, Bld: 48 mg/dL — ABNORMAL LOW (ref 70–99)
HCT: 34 % — ABNORMAL LOW (ref 39.0–52.0)
Hemoglobin: 11.6 g/dL — ABNORMAL LOW (ref 13.0–17.0)
Potassium: 3.6 mmol/L (ref 3.5–5.1)
Sodium: 136 mmol/L (ref 135–145)
TCO2: 26 mmol/L (ref 22–32)

## 2021-11-22 LAB — GLUCOSE, CAPILLARY
Glucose-Capillary: 59 mg/dL — ABNORMAL LOW (ref 70–99)
Glucose-Capillary: 121 mg/dL — ABNORMAL HIGH (ref 70–99)
Glucose-Capillary: 124 mg/dL — ABNORMAL HIGH (ref 70–99)

## 2021-11-22 SURGERY — LIGATION OF COMPETING BRANCHES OF ARTERIOVENOUS FISTULA
Anesthesia: Monitor Anesthesia Care | Site: Arm Upper | Laterality: Left

## 2021-11-22 MED ORDER — PHENYLEPHRINE HCL-NACL 20-0.9 MG/250ML-% IV SOLN
INTRAVENOUS | Status: DC | PRN
Start: 2021-11-22 — End: 2021-11-22
  Administered 2021-11-22: 40 ug/min via INTRAVENOUS

## 2021-11-22 MED ORDER — HEPARIN 6000 UNIT IRRIGATION SOLUTION
Status: AC
  Filled 2021-11-22: qty 500

## 2021-11-22 MED ORDER — 0.9 % SODIUM CHLORIDE (POUR BTL) OPTIME
TOPICAL | Status: DC | PRN
  Administered 2021-11-22: 1000 mL

## 2021-11-22 MED ORDER — STERILE WATER FOR IRRIGATION IR SOLN
Status: DC | PRN
  Administered 2021-11-22: 1000 mL

## 2021-11-22 MED ORDER — ORAL CARE MOUTH RINSE: 15.0000 mL | Freq: Once | OROMUCOSAL | Status: AC

## 2021-11-22 MED ORDER — PHENYLEPHRINE 80 MCG/ML (10ML) SYRINGE FOR IV PUSH (FOR BLOOD PRESSURE SUPPORT)
PREFILLED_SYRINGE | INTRAVENOUS | Status: AC
  Filled 2021-11-22: qty 10

## 2021-11-22 MED ORDER — FENTANYL CITRATE (PF) 250 MCG/5ML IJ SOLN
INTRAMUSCULAR | Status: AC
  Filled 2021-11-22: qty 5

## 2021-11-22 MED ORDER — LIDOCAINE-EPINEPHRINE (PF) 1 %-1:200000 IJ SOLN
INTRAMUSCULAR | Status: AC
  Filled 2021-11-22: qty 30

## 2021-11-22 MED ORDER — PHENYLEPHRINE 80 MCG/ML (10ML) SYRINGE FOR IV PUSH (FOR BLOOD PRESSURE SUPPORT)
PREFILLED_SYRINGE | INTRAVENOUS | Status: DC | PRN
  Administered 2021-11-22: 160 ug via INTRAVENOUS

## 2021-11-22 MED ORDER — CHLORHEXIDINE GLUCONATE 0.12 % MT SOLN
15.0000 mL | Freq: Once | OROMUCOSAL | Status: AC
  Administered 2021-11-22: 15 mL via OROMUCOSAL
  Filled 2021-11-22: qty 15

## 2021-11-22 MED ORDER — ONDANSETRON HCL 4 MG/2ML IJ SOLN: 4.0000 mg | Freq: Once | INTRAMUSCULAR | Status: DC | PRN

## 2021-11-22 MED ORDER — FENTANYL CITRATE (PF) 250 MCG/5ML IJ SOLN
INTRAMUSCULAR | Status: DC | PRN
  Administered 2021-11-22: 50 ug via INTRAVENOUS

## 2021-11-22 MED ORDER — MIDAZOLAM HCL 2 MG/2ML IJ SOLN
INTRAMUSCULAR | Status: AC
  Filled 2021-11-22: qty 2

## 2021-11-22 MED ORDER — VANCOMYCIN HCL IN DEXTROSE 1-5 GM/200ML-% IV SOLN
1000.0000 mg | INTRAVENOUS | Status: AC
  Administered 2021-11-22: 1000 mg via INTRAVENOUS
  Filled 2021-11-22: qty 200

## 2021-11-22 MED ORDER — DEXTROSE 50 % IV SOLN: 12.5000 g | INTRAVENOUS | Status: AC

## 2021-11-22 MED ORDER — OXYCODONE-ACETAMINOPHEN 5-325 MG PO TABS: 1.0000 | ORAL_TABLET | Freq: Four times a day (QID) | ORAL | 0 refills | Status: DC | PRN

## 2021-11-22 MED ORDER — CHLORHEXIDINE GLUCONATE 4 % EX LIQD: 60.0000 mL | Freq: Once | CUTANEOUS | Status: DC

## 2021-11-22 MED ORDER — ACETAMINOPHEN 500 MG PO TABS
1000.0000 mg | ORAL_TABLET | Freq: Once | ORAL | Status: AC
  Administered 2021-11-22: 1000 mg via ORAL

## 2021-11-22 MED ORDER — SODIUM CHLORIDE 0.9 % IV SOLN: INTRAVENOUS | Status: DC

## 2021-11-22 MED ORDER — MIDAZOLAM HCL 2 MG/2ML IJ SOLN
INTRAMUSCULAR | Status: DC | PRN
  Administered 2021-11-22: 1 mg via INTRAVENOUS

## 2021-11-22 MED ORDER — ONDANSETRON HCL 4 MG/2ML IJ SOLN
INTRAMUSCULAR | Status: AC
  Filled 2021-11-22: qty 2

## 2021-11-22 MED ORDER — PROPOFOL 500 MG/50ML IV EMUL
INTRAVENOUS | Status: DC | PRN
  Administered 2021-11-22: 40 ug/kg/min via INTRAVENOUS

## 2021-11-22 MED ORDER — ONDANSETRON HCL 4 MG/2ML IJ SOLN
INTRAMUSCULAR | Status: DC | PRN
  Administered 2021-11-22: 4 mg via INTRAVENOUS

## 2021-11-22 MED ORDER — ROPIVACAINE HCL 5 MG/ML IJ SOLN
INTRAMUSCULAR | Status: DC | PRN
  Administered 2021-11-22: 25 mL via PERINEURAL

## 2021-11-22 MED ORDER — DEXTROSE 50 % IV SOLN
INTRAVENOUS | Status: AC
  Administered 2021-11-22: 12.5 g via INTRAVENOUS
  Filled 2021-11-22: qty 50

## 2021-11-22 MED ORDER — FENTANYL CITRATE (PF) 100 MCG/2ML IJ SOLN: 25.0000 ug | INTRAMUSCULAR | Status: DC | PRN

## 2021-11-22 MED ORDER — SODIUM CHLORIDE 0.9 % IV SOLN
INTRAVENOUS | Status: DC
Start: 2021-11-22 — End: 2021-11-24

## 2021-11-22 SURGICAL SUPPLY — 35 items
ADH SKN CLS APL DERMABOND .7 (GAUZE/BANDAGES/DRESSINGS) ×1
ARMBAND PINK RESTRICT EXTREMIT (MISCELLANEOUS) ×3 IMPLANT
BAG COUNTER SPONGE SURGICOUNT (BAG) ×3 IMPLANT
BAG SPNG CNTER NS LX DISP (BAG) ×1
CANISTER SUCT 3000ML PPV (MISCELLANEOUS) ×3 IMPLANT
COVER PROBE W GEL 5X96 (DRAPES) ×3 IMPLANT
DERMABOND ADVANCED (GAUZE/BANDAGES/DRESSINGS) ×1
DERMABOND ADVANCED .7 DNX12 (GAUZE/BANDAGES/DRESSINGS) ×2 IMPLANT
ELECT REM PT RETURN 9FT ADLT (ELECTROSURGICAL) ×2
ELECTRODE REM PT RTRN 9FT ADLT (ELECTROSURGICAL) ×2 IMPLANT
GAUZE 4X4 16PLY ~~LOC~~+RFID DBL (SPONGE) ×1 IMPLANT
GLOVE BIO SURGEON STRL SZ7.5 (GLOVE) ×3 IMPLANT
GLOVE BIOGEL PI IND STRL 8 (GLOVE) ×2 IMPLANT
GLOVE BIOGEL PI INDICATOR 8 (GLOVE) ×1
GLOVE SRG 8 PF TXTR STRL LF DI (GLOVE) ×2 IMPLANT
GLOVE SURG POLYISO LF SZ8 (GLOVE) IMPLANT
GLOVE SURG UNDER POLY LF SZ8 (GLOVE) ×2
GOWN STRL REUS W/ TWL LRG LVL3 (GOWN DISPOSABLE) ×4 IMPLANT
GOWN STRL REUS W/TWL 2XL LVL3 (GOWN DISPOSABLE) ×3 IMPLANT
GOWN STRL REUS W/TWL LRG LVL3 (GOWN DISPOSABLE) ×4
KIT BASIN OR (CUSTOM PROCEDURE TRAY) ×3 IMPLANT
KIT TURNOVER KIT B (KITS) ×3 IMPLANT
NS IRRIG 1000ML POUR BTL (IV SOLUTION) ×3 IMPLANT
PACK CV ACCESS (CUSTOM PROCEDURE TRAY) ×3 IMPLANT
PAD ARMBOARD 7.5X6 YLW CONV (MISCELLANEOUS) ×6 IMPLANT
SPONGE T-LAP 18X18 ~~LOC~~+RFID (SPONGE) ×1 IMPLANT
SUT ETHILON 3 0 PS 1 (SUTURE) IMPLANT
SUT MNCRL AB 4-0 PS2 18 (SUTURE) ×6 IMPLANT
SUT PROLENE 6 0 BV (SUTURE) ×1 IMPLANT
SUT SILK 0 TIES 10X30 (SUTURE) ×3 IMPLANT
SUT VIC AB 3-0 SH 27 (SUTURE) ×4
SUT VIC AB 3-0 SH 27X BRD (SUTURE) ×2 IMPLANT
TOWEL GREEN STERILE (TOWEL DISPOSABLE) ×3 IMPLANT
UNDERPAD 30X36 HEAVY ABSORB (UNDERPADS AND DIAPERS) ×3 IMPLANT
WATER STERILE IRR 1000ML POUR (IV SOLUTION) ×3 IMPLANT

## 2021-11-22 NOTE — Op Note (Signed)
    NAME: Robert Lucas    MRN: [id number] DOB: 1941/04/28    DATE OF OPERATION: 11/22/2021  PREOP DIAGNOSIS:    ESRD  POSTOP DIAGNOSIS:    same  PROCEDURE:    Left brachiocephalic fistula branch ligation  SURGEON: Broadus John  ASSIST: Leontine Locket PA  ANESTHESIA: Moderate, block   EBL: 22ml  INDICATIONS:    [name] is a 81 y.o. male with end-stage renal disease status post left-sided brachiocephalic fistula.  This fistula has had difficulty with maturation, and has undergone fistulogram, balloon venoplasty of central stenosis.  Fistulogram also demonstrated significant branching along the cephalic vein.  After discussing the risk and benefits of branch ligation in an effort to improve fistula flow, [place] elected to proceed.  FINDINGS:    4 fistula branches ligated  TECHNIQUE:   Patient was brought to the OR laid in the supine position.  Moderate anesthesia was induced and the patient was prepped and draped in standard fashion  An ultrasound was used to insonate the previously created left brachiocephalic fistula.  Branches were marked and two longitudinal incisions, roughly 4 cm in length were made along the fistula.  Branches were ligated using 2-0 silk suture.  Wounds were irrigated with saline and closed using 3-0 Vicryl suture with Monocryl and Dermabond at the skin.   Macie Burows, MD [facility]  DATE OF DICTATION:   11/22/2021

## 2021-11-22 NOTE — Anesthesia Procedure Notes (Signed)
Anesthesia Regional Block: Supraclavicular block   Pre-Anesthetic Checklist: , timeout performed,  Correct Patient, Correct Site, Correct Laterality,  Correct Procedure, Correct Position, site marked,  Risks and benefits discussed,  Surgical consent,  Pre-op evaluation,  At surgeon's request and post-op pain management  Laterality: Left  Prep: Maximum Sterile Barrier Precautions used, chloraprep       Needles:  Injection technique: Single-shot  Needle Type: Echogenic Stimulator Needle     Needle Length: 9cm  Needle Gauge: 22     Additional Needles:   Procedures:,,,, ultrasound used (permanent image in chart),,    Narrative:  Start time: 11/22/2021 7:00 AM End time: 11/22/2021 7:10 AM Injection made incrementally with aspirations every 5 mL.  Performed by: Personally  Anesthesiologist: Pervis Hocking, DO  Additional Notes: Monitors applied. No increased pain on injection. No increased resistance to injection. Injection made in 5cc increments. Good needle visualization. Patient tolerated procedure well.

## 2021-11-22 NOTE — Anesthesia Postprocedure Evaluation (Signed)
Anesthesia Post Note  Patient: Robert Lucas  Procedure(s) Performed: LEFT ARM FISTULA BRANCH LIGATION (Left: Arm Upper)     Patient location during evaluation: PACU Anesthesia Type: Regional and MAC Level of consciousness: awake and alert Pain management: pain level controlled Vital Signs Assessment: post-procedure vital signs reviewed and stable Respiratory status: spontaneous breathing, nonlabored ventilation and respiratory function stable Cardiovascular status: blood pressure returned to baseline and stable Postop Assessment: no apparent nausea or vomiting Anesthetic complications: no   No notable events documented.  Last Vitals:  Vitals:   11/22/21 0825 11/22/21 0840  BP: (!) 104/59 108/74  Pulse: 68 66  Resp: 13 20  Temp: 36.4 C 36.4 C  SpO2: 97% 99%    Last Pain:  Vitals:   11/22/21 0840  TempSrc:   PainSc: 0-No pain                 Pervis Hocking

## 2021-11-22 NOTE — Discharge Instructions (Signed)
   [facility]  Discharge Instructions  AV Fistula or Graft Surgery for Dialysis Access  Please refer to the following instructions for your post-procedure care. Your surgeon or physician assistant will discuss any changes with you.  Activity  You may drive the day following your surgery, if you are comfortable and no longer taking prescription pain medication. Resume full activity as the soreness in your incision resolves.  Bathing/Showering  You may shower after you go home. Keep your incision dry for 48 hours. Do not soak in a bathtub, hot tub, or swim until the incision heals completely. You may not shower if you have a hemodialysis catheter.  Incision Care  Clean your incision with mild soap and water after 48 hours. Pat the area dry with a clean towel. You do not need a bandage unless otherwise instructed. Do not apply any ointments or creams to your incision. You may have skin glue on your incision. Do not peel it off. It will come off on its own in about one week. Your arm may swell a bit after surgery. To reduce swelling use pillows to elevate your arm so it is above your heart. Your doctor will tell you if you need to lightly wrap your arm with an ACE bandage.  Diet  Resume your normal diet. There are not special food restrictions following this procedure. In order to heal from your surgery, it is CRITICAL to get adequate nutrition. Your body requires vitamins, minerals, and protein. Vegetables are the best source of vitamins and minerals. Vegetables also provide the perfect balance of protein. Processed food has little nutritional value, so try to avoid this.  Medications  Resume taking all of your medications. If your incision is causing pain, you may take over-the counter pain relievers such as acetaminophen (Tylenol). If you were prescribed a stronger pain medication, please be aware these medications can cause nausea and constipation. Prevent  nausea by taking the medication with a snack or meal. Avoid constipation by drinking plenty of fluids and eating foods with high amount of fiber, such as fruits, vegetables, and grains.  Do not take Tylenol if you are taking prescription pain medications.  Follow up Your surgeon may want to see you in the office following your access surgery. If so, this will be arranged at the time of your surgery.  Please call us immediately for any of the following conditions:  Increased pain, redness, drainage (pus) from your incision site Fever of 101 degrees or higher Severe or worsening pain at your incision site Hand pain or numbness.  Reduce your risk of vascular disease:  Stop smoking. If you would like help, call [facility] at 1-800-QUIT-NOW 571-384-4347) or [place] at [place] your cholesterol Maintain a desired weight Control your diabetes Keep your blood pressure down  Dialysis  It will take several weeks to several months for your new dialysis access to be ready for use. Your surgeon will determine when it is okay to use it. Your nephrologist will continue to direct your dialysis. You can continue to use your Permcath until your new access is ready for use.   11/22/2021 Robert Lucas [id number] 1940/08/02  Surgeon(s): Broadus John, MD  Procedure(s): LEFT ARM FISTULA BRANCH LIGATION  x Do not stick fistula for 8 weeks    If you have any questions, please call the office at [phone].

## 2021-11-22 NOTE — Transfer of Care (Signed)
Immediate Anesthesia Transfer of Care Note  Patient: Robert Lucas  Procedure(s) Performed: LEFT ARM FISTULA BRANCH LIGATION (Left: Arm Upper)  Patient Location: PACU  Anesthesia Type:MAC combined with regional for post-op pain  Level of Consciousness: awake, alert  and oriented  Airway & Oxygen Therapy: Patient Spontanous Breathing  Post-op Assessment: Report given to RN, Post -op Vital signs reviewed and stable and Patient able to stick tongue midline  Post vital signs: Reviewed  Last Vitals:  Vitals Value Taken Time  BP 104/59 11/22/21 0827  Temp 36.4 C 11/22/21 0825  Pulse 65 11/22/21 0828  Resp 14 11/22/21 0828  SpO2 98 % 11/22/21 0828  Vitals shown include unvalidated device data.  Last Pain:  Vitals:   11/22/21 0623  TempSrc: Oral  PainSc:          Complications: No notable events documented.

## 2021-11-22 NOTE — Anesthesia Preprocedure Evaluation (Signed)
Anesthesia Evaluation  Patient identified by MRN, date of birth, ID band Patient awake    Reviewed: Allergy & Precautions, NPO status , Patient's Chart, lab work & pertinent test results, reviewed documented beta blocker date and time   History of Anesthesia Complications (+) PONV and history of anesthetic complications  Airway Mallampati: II  TM Distance: >3 FB Neck ROM: Full    Dental no notable dental hx.    Pulmonary sleep apnea , former smoker,  Quit smoking 1990    Pulmonary exam normal breath sounds clear to auscultation       Cardiovascular hypertension, Pt. on medications and Pt. on home beta blockers pulmonary hypertension (severe pHTN)+ CAD, + Past MI, + Peripheral Vascular Disease and +CHF (LVEF 58-09%, grade 2 diastolic dysfunction)  Normal cardiovascular exam Rhythm:Regular Rate:Normal  Echo 08/2021 1. Left ventricular ejection fraction, by estimation, is 30 to 35%. The  left ventricle has moderately decreased function. The left ventricle has  no regional wall motion abnormalities. The left ventricular internal  cavity size was mildly dilated. Left  ventricular diastolic parameters are consistent with Grade II diastolic  dysfunction (pseudonormalization).  2. Right ventricular systolic function is normal. The right ventricular  size is normal. There is severely elevated pulmonary artery systolic  pressure. The estimated right ventricular systolic pressure is 98.3 mmHg.  3. Left atrial size was moderately dilated.  4. The mitral valve is normal in structure. No evidence of mitral valve  regurgitation. No evidence of mitral stenosis.  5. Tricuspid valve regurgitation is mild to moderate.  6. The aortic valve is tricuspid. There is mild calcification of the  aortic valve. There is mild thickening of the aortic valve. Aortic valve  regurgitation is not visualized. Aortic valve sclerosis is present, with  no  evidence of aortic valve stenosis.  7. The inferior vena cava is dilated in size with <50% respiratory  variability, suggesting right atrial pressure of 15 mmHg.    Neuro/Psych negative psych ROS   GI/Hepatic negative GI ROS, Neg liver ROS,   Endo/Other  diabetes, Type 2  Renal/GU ESRF and DialysisRenal disease  negative genitourinary   Musculoskeletal  (+) Arthritis , Osteoarthritis,    Abdominal   Peds negative pediatric ROS (+)  Hematology negative hematology ROS (+)   Anesthesia Other Findings   Reproductive/Obstetrics negative OB ROS                             Anesthesia Physical Anesthesia Plan  ASA: 4  Anesthesia Plan: MAC and Regional   Post-op Pain Management: Regional block* and Tylenol PO (pre-op)*   Induction:   PONV Risk Score and Plan: 2 and Propofol infusion and TIVA  Airway Management Planned: Natural Airway and Simple Face Mask  Additional Equipment: None  Intra-op Plan:   Post-operative Plan:   Informed Consent: I have reviewed the patients History and Physical, chart, labs and discussed the procedure including the risks, benefits and alternatives for the proposed anesthesia with the patient or authorized representative who has indicated his/her understanding and acceptance.       Plan Discussed with: CRNA  Anesthesia Plan Comments: (Severe pHTN- regional  + light sedation )        Anesthesia Quick Evaluation

## 2021-11-22 NOTE — H&P (Signed)
POST OPERATIVE OFFICE NOTE    Patient seen and examined in preop holding.  No complaints. No changes to medication history or physical exam since last seen in clinic. After discussing the risks and benefits of fistula branch ligation to aid in maturation, CHUONG CASEBEER elected to proceed.   Broadus John MD  CC:  F/u for surgery  HPI:  This is a 81 y.o. male who is s/p LEFT brachiocephalic fistula creation on 05/17/21.  Most recently, Jovon underwent balloon assisted maturation of his left brachiocephalic fistula. He presents today for follow up by his wife.  Since his balloon anoplasty, Dionisio has been doing well.  No complaints, no symptoms concerning for steal syndrome.    Allergies  Allergen Reactions   Zestril [Lisinopril] Cough   Hydrocodone Nausea Only   Januvia [Sitagliptin] Other (See Comments)    Unknown   Levitra [Vardenafil] Other (See Comments)    flushing   Prednisone Other (See Comments)    Elevates BGL; patient prefers to not take this   Victoza [Liraglutide] Other (See Comments)    GI upset   Penicillins Rash and Other (See Comments)    Rash around ankles Has patient had a PCN reaction causing immediate rash, facial/tongue/throat swelling, SOB or lightheadedness with hypotension: Yes Has patient had a PCN reaction causing severe rash involving mucus membranes or skin necrosis: Unk Has patient had a PCN reaction that required hospitalization: No Has patient had a PCN reaction occurring within the last 10 years: No If all of the above answers are "NO", then may proceed with Cephalosporin use.     Current Facility-Administered Medications  Medication Dose Route Frequency Provider Last Rate Last Admin   0.9 %  sodium chloride infusion   Intravenous Continuous Angelia Mould, MD       0.9 %  sodium chloride infusion   Intravenous Continuous Pervis Hocking, DO 10 mL/hr at 11/22/21 0708 Continued from Pre-op at 11/22/21 0708   chlorhexidine  (HIBICLENS) 4 % liquid 4 application.  60 mL Topical Once Angelia Mould, MD       And   [START ON 11/23/2021] chlorhexidine (HIBICLENS) 4 % liquid 4 application.  60 mL Topical Once Angelia Mould, MD       Facility-Administered Medications Ordered in Other Encounters  Medication Dose Route Frequency Provider Last Rate Last Admin   fentaNYL citrate (PF) (SUBLIMAZE) injection   Intravenous Anesthesia Intra-op Maude Leriche, CRNA   50 mcg at 11/22/21 0703   midazolam (VERSED) injection   Intravenous Anesthesia Intra-op Maude Leriche, CRNA   1 mg at 11/22/21 0703     ROS:  See HPI  Physical Exam:   OUTFLOW VEIN   PSV     Diameter  Depth (cm)            Describe                           (cm/s)      (cm)                                                 +------------+---------+------------+-----------+--------------------------  ----+  Shoulder       156       0.53       0.77                                      +------------+---------+------------+-----------+--------------------------  ----+  Prox UA        143       0.37       0.32                                      +------------+---------+------------+-----------+--------------------------  ----+  Mid UA         163       0.37       0.40    competing branch and 0.15  169                                                             cm                  +------------+---------+------------+-----------+--------------------------  ----+  Dist UA     655 / 4350.26 / 0.43 0.32 / 0.44      change in Diameter          +------------+---------+------------+-----------+--------------------------  ----+  AC Fossa    464 / 2650.30 / 0.60 0.46 / 0.38                                  +------------+---------+------------+-----------+--------------------------  ----+     Summary:  Patent left brachio-cephalic AVF.  No stenosis.  Branch in the distal upper arm 0.3cm.   Largest outflow diameter of 0.45cm.       Incision:  well healed Extremities:  palpable thrill and radial pulse left UE Left UE N/V/M intact   Assessment/Plan:  This is a 81 y.o. male who is s/p: Left brachial cephalic av fistula, balloon assisted fistula maturation.  The proximal portion of the cephalic vein has improved luminal diameter, however the fistula is still small at the mid humerus.  I had a long conversation with him regarding the above.  There is a large branch distally that can be ligated, and would improve blood flow through the fistula.  The patient is on dialysis, I gave him the option of branch ligation versus new fistula creation.  After discussing the risk and benefits, he elected to pursue left-sided fistula branch ligation in an effort to further mature the fistula as it is close to size criteria for use.  There is aware that should this not improve the luminal diameter of his fistula over the next month, he would likely require new access.   Broadus John [facility] [phone]

## 2021-11-22 NOTE — Progress Notes (Signed)
Orthopedic Tech Progress Note Patient Details:  Robert Lucas [birth year] [id number]  PACU RN called requesting an ARM SLING for patient  Ortho Devices Type of Ortho Device: Arm sling Ortho Device/Splint Interventions: Ordered   Post Interventions Patient Tolerated: Well Instructions Provided: [facility] [place] 11/22/2021, 9:47 AM

## 2021-11-23 ENCOUNTER — Encounter (HOSPITAL_COMMUNITY): Payer: Self-pay | Admitting: Vascular Surgery

## 2021-11-23 DIAGNOSIS — D631 Anemia in chronic kidney disease: Secondary | ICD-10-CM | POA: Diagnosis not present

## 2021-11-23 DIAGNOSIS — Z992 Dependence on renal dialysis: Secondary | ICD-10-CM | POA: Diagnosis not present

## 2021-11-23 DIAGNOSIS — N186 End stage renal disease: Secondary | ICD-10-CM | POA: Diagnosis not present

## 2021-11-23 DIAGNOSIS — T8249XA Other complication of vascular dialysis catheter, initial encounter: Secondary | ICD-10-CM | POA: Diagnosis not present

## 2021-11-23 DIAGNOSIS — N2581 Secondary hyperparathyroidism of renal origin: Secondary | ICD-10-CM | POA: Diagnosis not present

## 2021-11-23 DIAGNOSIS — D689 Coagulation defect, unspecified: Secondary | ICD-10-CM | POA: Diagnosis not present

## 2021-11-23 DIAGNOSIS — D509 Iron deficiency anemia, unspecified: Secondary | ICD-10-CM | POA: Diagnosis not present

## 2021-11-25 DIAGNOSIS — D631 Anemia in chronic kidney disease: Secondary | ICD-10-CM | POA: Diagnosis not present

## 2021-11-25 DIAGNOSIS — D509 Iron deficiency anemia, unspecified: Secondary | ICD-10-CM | POA: Diagnosis not present

## 2021-11-25 DIAGNOSIS — N2581 Secondary hyperparathyroidism of renal origin: Secondary | ICD-10-CM | POA: Diagnosis not present

## 2021-11-25 DIAGNOSIS — T8249XA Other complication of vascular dialysis catheter, initial encounter: Secondary | ICD-10-CM | POA: Diagnosis not present

## 2021-11-25 DIAGNOSIS — N186 End stage renal disease: Secondary | ICD-10-CM | POA: Diagnosis not present

## 2021-11-25 DIAGNOSIS — Z992 Dependence on renal dialysis: Secondary | ICD-10-CM | POA: Diagnosis not present

## 2021-11-25 DIAGNOSIS — D689 Coagulation defect, unspecified: Secondary | ICD-10-CM | POA: Diagnosis not present

## 2021-11-28 DIAGNOSIS — Z992 Dependence on renal dialysis: Secondary | ICD-10-CM | POA: Diagnosis not present

## 2021-11-28 DIAGNOSIS — D631 Anemia in chronic kidney disease: Secondary | ICD-10-CM | POA: Diagnosis not present

## 2021-11-28 DIAGNOSIS — T8249XA Other complication of vascular dialysis catheter, initial encounter: Secondary | ICD-10-CM | POA: Diagnosis not present

## 2021-11-28 DIAGNOSIS — N2581 Secondary hyperparathyroidism of renal origin: Secondary | ICD-10-CM | POA: Diagnosis not present

## 2021-11-28 DIAGNOSIS — D689 Coagulation defect, unspecified: Secondary | ICD-10-CM | POA: Diagnosis not present

## 2021-11-28 DIAGNOSIS — D509 Iron deficiency anemia, unspecified: Secondary | ICD-10-CM | POA: Diagnosis not present

## 2021-11-28 DIAGNOSIS — N186 End stage renal disease: Secondary | ICD-10-CM | POA: Diagnosis not present

## 2021-11-30 DIAGNOSIS — N186 End stage renal disease: Secondary | ICD-10-CM | POA: Diagnosis not present

## 2021-11-30 DIAGNOSIS — T8249XA Other complication of vascular dialysis catheter, initial encounter: Secondary | ICD-10-CM | POA: Diagnosis not present

## 2021-11-30 DIAGNOSIS — E1129 Type 2 diabetes mellitus with other diabetic kidney complication: Secondary | ICD-10-CM | POA: Diagnosis not present

## 2021-11-30 DIAGNOSIS — D689 Coagulation defect, unspecified: Secondary | ICD-10-CM | POA: Diagnosis not present

## 2021-11-30 DIAGNOSIS — D509 Iron deficiency anemia, unspecified: Secondary | ICD-10-CM | POA: Diagnosis not present

## 2021-11-30 DIAGNOSIS — D631 Anemia in chronic kidney disease: Secondary | ICD-10-CM | POA: Diagnosis not present

## 2021-11-30 DIAGNOSIS — Z992 Dependence on renal dialysis: Secondary | ICD-10-CM | POA: Diagnosis not present

## 2021-11-30 DIAGNOSIS — N2581 Secondary hyperparathyroidism of renal origin: Secondary | ICD-10-CM | POA: Diagnosis not present

## 2021-12-02 DIAGNOSIS — D689 Coagulation defect, unspecified: Secondary | ICD-10-CM | POA: Diagnosis not present

## 2021-12-02 DIAGNOSIS — D631 Anemia in chronic kidney disease: Secondary | ICD-10-CM | POA: Diagnosis not present

## 2021-12-02 DIAGNOSIS — N186 End stage renal disease: Secondary | ICD-10-CM | POA: Diagnosis not present

## 2021-12-02 DIAGNOSIS — Z992 Dependence on renal dialysis: Secondary | ICD-10-CM | POA: Diagnosis not present

## 2021-12-02 DIAGNOSIS — N2581 Secondary hyperparathyroidism of renal origin: Secondary | ICD-10-CM | POA: Diagnosis not present

## 2021-12-02 DIAGNOSIS — T8249XA Other complication of vascular dialysis catheter, initial encounter: Secondary | ICD-10-CM | POA: Diagnosis not present

## 2021-12-05 DIAGNOSIS — N2581 Secondary hyperparathyroidism of renal origin: Secondary | ICD-10-CM | POA: Diagnosis not present

## 2021-12-05 DIAGNOSIS — T8249XA Other complication of vascular dialysis catheter, initial encounter: Secondary | ICD-10-CM | POA: Diagnosis not present

## 2021-12-05 DIAGNOSIS — D689 Coagulation defect, unspecified: Secondary | ICD-10-CM | POA: Diagnosis not present

## 2021-12-05 DIAGNOSIS — D631 Anemia in chronic kidney disease: Secondary | ICD-10-CM | POA: Diagnosis not present

## 2021-12-05 DIAGNOSIS — N186 End stage renal disease: Secondary | ICD-10-CM | POA: Diagnosis not present

## 2021-12-05 DIAGNOSIS — Z992 Dependence on renal dialysis: Secondary | ICD-10-CM | POA: Diagnosis not present

## 2021-12-06 DIAGNOSIS — Z794 Long term (current) use of insulin: Secondary | ICD-10-CM | POA: Diagnosis not present

## 2021-12-06 DIAGNOSIS — E119 Type 2 diabetes mellitus without complications: Secondary | ICD-10-CM | POA: Diagnosis not present

## 2021-12-07 DIAGNOSIS — D631 Anemia in chronic kidney disease: Secondary | ICD-10-CM | POA: Diagnosis not present

## 2021-12-07 DIAGNOSIS — Z992 Dependence on renal dialysis: Secondary | ICD-10-CM | POA: Diagnosis not present

## 2021-12-07 DIAGNOSIS — T8249XA Other complication of vascular dialysis catheter, initial encounter: Secondary | ICD-10-CM | POA: Diagnosis not present

## 2021-12-07 DIAGNOSIS — D689 Coagulation defect, unspecified: Secondary | ICD-10-CM | POA: Diagnosis not present

## 2021-12-07 DIAGNOSIS — N186 End stage renal disease: Secondary | ICD-10-CM | POA: Diagnosis not present

## 2021-12-07 DIAGNOSIS — N2581 Secondary hyperparathyroidism of renal origin: Secondary | ICD-10-CM | POA: Diagnosis not present

## 2021-12-09 DIAGNOSIS — Z992 Dependence on renal dialysis: Secondary | ICD-10-CM | POA: Diagnosis not present

## 2021-12-09 DIAGNOSIS — N186 End stage renal disease: Secondary | ICD-10-CM | POA: Diagnosis not present

## 2021-12-09 DIAGNOSIS — N2581 Secondary hyperparathyroidism of renal origin: Secondary | ICD-10-CM | POA: Diagnosis not present

## 2021-12-09 DIAGNOSIS — D689 Coagulation defect, unspecified: Secondary | ICD-10-CM | POA: Diagnosis not present

## 2021-12-09 DIAGNOSIS — T8249XA Other complication of vascular dialysis catheter, initial encounter: Secondary | ICD-10-CM | POA: Diagnosis not present

## 2021-12-09 DIAGNOSIS — D631 Anemia in chronic kidney disease: Secondary | ICD-10-CM | POA: Diagnosis not present

## 2021-12-12 DIAGNOSIS — N186 End stage renal disease: Secondary | ICD-10-CM | POA: Diagnosis not present

## 2021-12-12 DIAGNOSIS — D631 Anemia in chronic kidney disease: Secondary | ICD-10-CM | POA: Diagnosis not present

## 2021-12-12 DIAGNOSIS — Z992 Dependence on renal dialysis: Secondary | ICD-10-CM | POA: Diagnosis not present

## 2021-12-12 DIAGNOSIS — D689 Coagulation defect, unspecified: Secondary | ICD-10-CM | POA: Diagnosis not present

## 2021-12-12 DIAGNOSIS — T8249XA Other complication of vascular dialysis catheter, initial encounter: Secondary | ICD-10-CM | POA: Diagnosis not present

## 2021-12-12 DIAGNOSIS — N2581 Secondary hyperparathyroidism of renal origin: Secondary | ICD-10-CM | POA: Diagnosis not present

## 2021-12-14 DIAGNOSIS — T8249XA Other complication of vascular dialysis catheter, initial encounter: Secondary | ICD-10-CM | POA: Diagnosis not present

## 2021-12-14 DIAGNOSIS — Z992 Dependence on renal dialysis: Secondary | ICD-10-CM | POA: Diagnosis not present

## 2021-12-14 DIAGNOSIS — N2581 Secondary hyperparathyroidism of renal origin: Secondary | ICD-10-CM | POA: Diagnosis not present

## 2021-12-14 DIAGNOSIS — D689 Coagulation defect, unspecified: Secondary | ICD-10-CM | POA: Diagnosis not present

## 2021-12-14 DIAGNOSIS — N186 End stage renal disease: Secondary | ICD-10-CM | POA: Diagnosis not present

## 2021-12-14 DIAGNOSIS — D631 Anemia in chronic kidney disease: Secondary | ICD-10-CM | POA: Diagnosis not present

## 2021-12-16 DIAGNOSIS — D631 Anemia in chronic kidney disease: Secondary | ICD-10-CM | POA: Diagnosis not present

## 2021-12-16 DIAGNOSIS — N2581 Secondary hyperparathyroidism of renal origin: Secondary | ICD-10-CM | POA: Diagnosis not present

## 2021-12-16 DIAGNOSIS — N186 End stage renal disease: Secondary | ICD-10-CM | POA: Diagnosis not present

## 2021-12-16 DIAGNOSIS — D689 Coagulation defect, unspecified: Secondary | ICD-10-CM | POA: Diagnosis not present

## 2021-12-16 DIAGNOSIS — Z992 Dependence on renal dialysis: Secondary | ICD-10-CM | POA: Diagnosis not present

## 2021-12-16 DIAGNOSIS — T8249XA Other complication of vascular dialysis catheter, initial encounter: Secondary | ICD-10-CM | POA: Diagnosis not present

## 2021-12-19 DIAGNOSIS — Z992 Dependence on renal dialysis: Secondary | ICD-10-CM | POA: Diagnosis not present

## 2021-12-19 DIAGNOSIS — T8249XA Other complication of vascular dialysis catheter, initial encounter: Secondary | ICD-10-CM | POA: Diagnosis not present

## 2021-12-19 DIAGNOSIS — D631 Anemia in chronic kidney disease: Secondary | ICD-10-CM | POA: Diagnosis not present

## 2021-12-19 DIAGNOSIS — N2581 Secondary hyperparathyroidism of renal origin: Secondary | ICD-10-CM | POA: Diagnosis not present

## 2021-12-19 DIAGNOSIS — N186 End stage renal disease: Secondary | ICD-10-CM | POA: Diagnosis not present

## 2021-12-19 DIAGNOSIS — D689 Coagulation defect, unspecified: Secondary | ICD-10-CM | POA: Diagnosis not present

## 2021-12-21 DIAGNOSIS — N2581 Secondary hyperparathyroidism of renal origin: Secondary | ICD-10-CM | POA: Diagnosis not present

## 2021-12-21 DIAGNOSIS — Z992 Dependence on renal dialysis: Secondary | ICD-10-CM | POA: Diagnosis not present

## 2021-12-21 DIAGNOSIS — D689 Coagulation defect, unspecified: Secondary | ICD-10-CM | POA: Diagnosis not present

## 2021-12-21 DIAGNOSIS — T8249XA Other complication of vascular dialysis catheter, initial encounter: Secondary | ICD-10-CM | POA: Diagnosis not present

## 2021-12-21 DIAGNOSIS — D631 Anemia in chronic kidney disease: Secondary | ICD-10-CM | POA: Diagnosis not present

## 2021-12-21 DIAGNOSIS — N186 End stage renal disease: Secondary | ICD-10-CM | POA: Diagnosis not present

## 2021-12-22 ENCOUNTER — Encounter: Payer: Self-pay | Admitting: Cardiology

## 2021-12-22 ENCOUNTER — Ambulatory Visit: Payer: Medicare Other | Admitting: Cardiology

## 2021-12-22 VITALS — BP 127/67 | HR 68 | Temp 97.5°F | Resp 17 | Ht 63.0 in | Wt 151.2 lb

## 2021-12-22 DIAGNOSIS — I5020 Unspecified systolic (congestive) heart failure: Secondary | ICD-10-CM

## 2021-12-22 DIAGNOSIS — I10 Essential (primary) hypertension: Secondary | ICD-10-CM | POA: Diagnosis not present

## 2021-12-22 DIAGNOSIS — N186 End stage renal disease: Secondary | ICD-10-CM | POA: Diagnosis not present

## 2021-12-22 DIAGNOSIS — Z992 Dependence on renal dialysis: Secondary | ICD-10-CM | POA: Diagnosis not present

## 2021-12-22 DIAGNOSIS — R079 Chest pain, unspecified: Secondary | ICD-10-CM | POA: Diagnosis not present

## 2021-12-22 NOTE — Progress Notes (Signed)
Patient referred by [name], NP for coronary artery disease  Subjective:   Robert Lucas, male    DOB: 03-05-1941, 81 y.o.   MRN: [id number]   Chief Complaint  Patient presents with   Hypertension   HFrEF    3 MONTH   HPI  81 y.o. Caucasian male with with hypertension, hyperlipidemia, IDDM, CAD s/p anterior MI in 1990s treated with TPA and PTCA, ESRD on HD, obesity, h/o gout, OSA, HFrEF  Patient was hospitalized in March 2023 with worsening acute on chronic renal failure, and was ultimately started on dialysis. He is currently getting dialyzed through permcath, AVF getting mature in LUE. He has been doing remarkably better since then. His leg edema and dyspnea have resolved, blood pressure is controlled-and in fact low normal with SBP in 90s at times.     Current Outpatient Medications:    acetaminophen (TYLENOL) 500 MG tablet, Take 500 mg by mouth every 6 (six) hours as needed for moderate pain or headache., Disp: , Rfl:    allopurinol (ZYLOPRIM) 100 MG tablet, Take 100 mg by mouth daily., Disp: , Rfl:    aspirin EC 81 MG tablet, Take 81 mg by mouth every evening., Disp: , Rfl:    atorvastatin (LIPITOR) 40 MG tablet, Take 40 mg by mouth at bedtime., Disp: , Rfl:    finasteride (PROSCAR) 5 MG tablet, Take 5 mg by mouth every evening., Disp: , Rfl:    fluticasone (FLONASE) 50 MCG/ACT nasal spray, Place 1 spray into both nostrils daily as needed for allergies or rhinitis., Disp: , Rfl:    gabapentin (NEURONTIN) 100 MG capsule, Take 100 mg by mouth 3 (three) times daily., Disp: , Rfl:    hydrALAZINE (APRESOLINE) 50 MG tablet, Take 1 tablet (50 mg total) by mouth in the morning and at bedtime. (Patient taking differently: Take 50 mg by mouth See admin instructions. Take 50 mg in the evening on M-W-F (dialysis days). Take 50 mg twice daily on Su-Tue-Thur-Sat (non-dialysis days)), Disp: 180 tablet, Rfl: 3   insulin aspart protamine - aspart (NOVOLOG MIX 70/30 FLEXPEN) (70-30)  100 UNIT/ML FlexPen, Inject 14-20 Units into the skin daily as needed (high blood sugar). If BS is <150=0 units, If BS is 150-200 units=14 units, If BS is >201=20 units, Disp: , Rfl:    isosorbide dinitrate (ISORDIL) 20 MG tablet, Take 1 tablet (20 mg total) by mouth 2 (two) times daily., Disp: 180 tablet, Rfl: 3   metoprolol succinate (TOPROL-XL) 50 MG 24 hr tablet, Take 0.5 tablets (25 mg total) by mouth daily. Take with or immediately following a meal., Disp: 30 tablet, Rfl: 0   oxyCODONE-acetaminophen (PERCOCET) 5-325 MG tablet, Take 1 tablet by mouth every 6 (six) hours as needed for severe pain., Disp: 8 tablet, Rfl: 0   tamsulosin (FLOMAX) 0.4 MG CAPS capsule, Take 1 capsule (0.4 mg total) by mouth daily after supper. (Patient taking differently: Take 0.4 mg by mouth at bedtime.), Disp: 30 capsule, Rfl: 0    Cardiovascular studies:  EKG 12/22/2021: Sinus rhythm 70 bpm Frequent ectopic ventricular beats  Anterior infarct -age undetermined  Echocardiogram 08/26/2021: 1. Left ventricular ejection fraction, by estimation, is 30 to 35%. The  left ventricle has moderately decreased function. The left ventricle has  no regional wall motion abnormalities. The left ventricular internal  cavity size was mildly dilated. Left ventricular diastolic parameters are  consistent with Grade II diastolic dysfunction (pseudonormalization).   2. Right ventricular systolic function is normal. The  right ventricular  size is normal. There is severely elevated pulmonary artery systolic  pressure. The estimated right ventricular systolic pressure is 32.5 mmHg.   3. Left atrial size was moderately dilated.   4. The mitral valve is normal in structure. No evidence of mitral valve  regurgitation. No evidence of mitral stenosis.   5. Tricuspid valve regurgitation is mild to moderate.   6. The aortic valve is tricuspid. There is mild calcification of the  aortic valve. There is mild thickening of the aortic  valve. Aortic valve  regurgitation is not visualized. Aortic valve sclerosis is present, with  no evidence of aortic valve stenosis.   7. The inferior vena cava is dilated in size with <50% respiratory  variability, suggesting right atrial pressure of 15 mmHg.   Cath 1990: Normal Left main, 99% stenosis proximal LAD, no significant disease CFX RCA, PTCA of LAD done   Recent labs: 09/07/2021: Glucose 308, BUN/Cr 78/4.87. EGFR 11. Na/K 135/4.2. Albumin 3.0. Phos 6.1. Rest of the CMP normal H/H 8/24. MCV 94. Platelets 156 HbA1C 6.3%   Review of Systems  Cardiovascular:  Positive for leg swelling (Mild, stable). Negative for chest pain, dyspnea on exertion, palpitations and syncope.  Musculoskeletal:  Positive for back pain (Radiating to left leg.  Also has left leg weakness.).        Vitals:   12/22/21 1054  BP: 127/67  Pulse: 68  Resp: 17  Temp: (!) 97.5 F (36.4 C)  SpO2: 100%     Body mass index is 26.78 kg/m. Filed Weights   12/22/21 1054  Weight: 151 lb 3.2 oz (68.6 kg)     Objective:   Physical Exam Vitals and nursing note reviewed.  Constitutional:      Appearance: He is well-developed.  Neck:     Vascular: No JVD.  Cardiovascular:     Rate and Rhythm: Normal rate and regular rhythm.     Pulses: Intact distal pulses.     Heart sounds: Normal heart sounds. No murmur heard. Pulmonary:     Effort: Pulmonary effort is normal.     Breath sounds: Normal breath sounds. No wheezing or rales.  Musculoskeletal:     Right lower leg: No edema.     Left lower leg: No edema.           Assessment & Recommendations:   81 y.o. Caucasian male with with hypertension, hyperlipidemia, IDDM, CAD s/p anterior MI in 40s treated with TPA and PTCA, ESRD on HD, obesity, h/o gout, OSA, HFrEF  HFrEF: EF 30-35% in the setting of advanced CKD, now on dialysis. Clinically euvolemic. Given low normal blood pressure,e I have discontinued his Isordil today. Continue  metoprolol succinate and hydralazine. Will repeat echocardiogram in 3 months  Coronary artery disease involving native coronary artery of native heart without angina pectoris No angina symptoms. Continue Aspirin, statin, metoprolol  F/u in 3 months  Jacoby Zanni Esther Hardy, MD [facility] Pager: [phone] Office: [phone] If no answer Cell [phone]

## 2021-12-23 ENCOUNTER — Ambulatory Visit: Payer: Medicare Other | Admitting: Cardiology

## 2021-12-23 DIAGNOSIS — Z992 Dependence on renal dialysis: Secondary | ICD-10-CM | POA: Diagnosis not present

## 2021-12-23 DIAGNOSIS — N2581 Secondary hyperparathyroidism of renal origin: Secondary | ICD-10-CM | POA: Diagnosis not present

## 2021-12-23 DIAGNOSIS — N186 End stage renal disease: Secondary | ICD-10-CM | POA: Diagnosis not present

## 2021-12-23 DIAGNOSIS — D631 Anemia in chronic kidney disease: Secondary | ICD-10-CM | POA: Diagnosis not present

## 2021-12-23 DIAGNOSIS — D689 Coagulation defect, unspecified: Secondary | ICD-10-CM | POA: Diagnosis not present

## 2021-12-23 DIAGNOSIS — T8249XA Other complication of vascular dialysis catheter, initial encounter: Secondary | ICD-10-CM | POA: Diagnosis not present

## 2021-12-24 ENCOUNTER — Other Ambulatory Visit: Payer: Self-pay

## 2021-12-24 DIAGNOSIS — N186 End stage renal disease: Secondary | ICD-10-CM

## 2021-12-26 DIAGNOSIS — D631 Anemia in chronic kidney disease: Secondary | ICD-10-CM | POA: Diagnosis not present

## 2021-12-26 DIAGNOSIS — T8249XA Other complication of vascular dialysis catheter, initial encounter: Secondary | ICD-10-CM | POA: Diagnosis not present

## 2021-12-26 DIAGNOSIS — D689 Coagulation defect, unspecified: Secondary | ICD-10-CM | POA: Diagnosis not present

## 2021-12-26 DIAGNOSIS — I10 Essential (primary) hypertension: Secondary | ICD-10-CM | POA: Diagnosis not present

## 2021-12-26 DIAGNOSIS — E1122 Type 2 diabetes mellitus with diabetic chronic kidney disease: Secondary | ICD-10-CM | POA: Diagnosis not present

## 2021-12-26 DIAGNOSIS — N186 End stage renal disease: Secondary | ICD-10-CM | POA: Diagnosis not present

## 2021-12-26 DIAGNOSIS — E782 Mixed hyperlipidemia: Secondary | ICD-10-CM | POA: Diagnosis not present

## 2021-12-26 DIAGNOSIS — Z992 Dependence on renal dialysis: Secondary | ICD-10-CM | POA: Diagnosis not present

## 2021-12-26 DIAGNOSIS — N2581 Secondary hyperparathyroidism of renal origin: Secondary | ICD-10-CM | POA: Diagnosis not present

## 2021-12-28 DIAGNOSIS — D689 Coagulation defect, unspecified: Secondary | ICD-10-CM | POA: Diagnosis not present

## 2021-12-28 DIAGNOSIS — D631 Anemia in chronic kidney disease: Secondary | ICD-10-CM | POA: Diagnosis not present

## 2021-12-28 DIAGNOSIS — N186 End stage renal disease: Secondary | ICD-10-CM | POA: Diagnosis not present

## 2021-12-28 DIAGNOSIS — Z992 Dependence on renal dialysis: Secondary | ICD-10-CM | POA: Diagnosis not present

## 2021-12-28 DIAGNOSIS — T8249XA Other complication of vascular dialysis catheter, initial encounter: Secondary | ICD-10-CM | POA: Diagnosis not present

## 2021-12-28 DIAGNOSIS — N2581 Secondary hyperparathyroidism of renal origin: Secondary | ICD-10-CM | POA: Diagnosis not present

## 2021-12-30 DIAGNOSIS — D689 Coagulation defect, unspecified: Secondary | ICD-10-CM | POA: Diagnosis not present

## 2021-12-30 DIAGNOSIS — N2581 Secondary hyperparathyroidism of renal origin: Secondary | ICD-10-CM | POA: Diagnosis not present

## 2021-12-30 DIAGNOSIS — D631 Anemia in chronic kidney disease: Secondary | ICD-10-CM | POA: Diagnosis not present

## 2021-12-30 DIAGNOSIS — Z992 Dependence on renal dialysis: Secondary | ICD-10-CM | POA: Diagnosis not present

## 2021-12-30 DIAGNOSIS — E1129 Type 2 diabetes mellitus with other diabetic kidney complication: Secondary | ICD-10-CM | POA: Diagnosis not present

## 2021-12-30 DIAGNOSIS — T8249XA Other complication of vascular dialysis catheter, initial encounter: Secondary | ICD-10-CM | POA: Diagnosis not present

## 2021-12-30 DIAGNOSIS — N186 End stage renal disease: Secondary | ICD-10-CM | POA: Diagnosis not present

## 2022-01-02 DIAGNOSIS — D509 Iron deficiency anemia, unspecified: Secondary | ICD-10-CM | POA: Diagnosis not present

## 2022-01-02 DIAGNOSIS — T8249XA Other complication of vascular dialysis catheter, initial encounter: Secondary | ICD-10-CM | POA: Diagnosis not present

## 2022-01-02 DIAGNOSIS — D631 Anemia in chronic kidney disease: Secondary | ICD-10-CM | POA: Diagnosis not present

## 2022-01-02 DIAGNOSIS — N2581 Secondary hyperparathyroidism of renal origin: Secondary | ICD-10-CM | POA: Diagnosis not present

## 2022-01-02 DIAGNOSIS — Z992 Dependence on renal dialysis: Secondary | ICD-10-CM | POA: Diagnosis not present

## 2022-01-02 DIAGNOSIS — D689 Coagulation defect, unspecified: Secondary | ICD-10-CM | POA: Diagnosis not present

## 2022-01-02 DIAGNOSIS — N186 End stage renal disease: Secondary | ICD-10-CM | POA: Diagnosis not present

## 2022-01-04 ENCOUNTER — Encounter (HOSPITAL_COMMUNITY): Payer: Medicare Other

## 2022-01-04 DIAGNOSIS — N2581 Secondary hyperparathyroidism of renal origin: Secondary | ICD-10-CM | POA: Diagnosis not present

## 2022-01-04 DIAGNOSIS — Z992 Dependence on renal dialysis: Secondary | ICD-10-CM | POA: Diagnosis not present

## 2022-01-04 DIAGNOSIS — N186 End stage renal disease: Secondary | ICD-10-CM | POA: Diagnosis not present

## 2022-01-04 DIAGNOSIS — D631 Anemia in chronic kidney disease: Secondary | ICD-10-CM | POA: Diagnosis not present

## 2022-01-04 DIAGNOSIS — T8249XA Other complication of vascular dialysis catheter, initial encounter: Secondary | ICD-10-CM | POA: Diagnosis not present

## 2022-01-04 DIAGNOSIS — D509 Iron deficiency anemia, unspecified: Secondary | ICD-10-CM | POA: Diagnosis not present

## 2022-01-04 DIAGNOSIS — D689 Coagulation defect, unspecified: Secondary | ICD-10-CM | POA: Diagnosis not present

## 2022-01-06 ENCOUNTER — Encounter (HOSPITAL_COMMUNITY): Payer: Medicare Other

## 2022-01-06 DIAGNOSIS — N2581 Secondary hyperparathyroidism of renal origin: Secondary | ICD-10-CM | POA: Diagnosis not present

## 2022-01-06 DIAGNOSIS — D509 Iron deficiency anemia, unspecified: Secondary | ICD-10-CM | POA: Diagnosis not present

## 2022-01-06 DIAGNOSIS — N186 End stage renal disease: Secondary | ICD-10-CM | POA: Diagnosis not present

## 2022-01-06 DIAGNOSIS — D689 Coagulation defect, unspecified: Secondary | ICD-10-CM | POA: Diagnosis not present

## 2022-01-06 DIAGNOSIS — T8249XA Other complication of vascular dialysis catheter, initial encounter: Secondary | ICD-10-CM | POA: Diagnosis not present

## 2022-01-06 DIAGNOSIS — Z992 Dependence on renal dialysis: Secondary | ICD-10-CM | POA: Diagnosis not present

## 2022-01-06 DIAGNOSIS — D631 Anemia in chronic kidney disease: Secondary | ICD-10-CM | POA: Diagnosis not present

## 2022-01-09 DIAGNOSIS — N186 End stage renal disease: Secondary | ICD-10-CM | POA: Diagnosis not present

## 2022-01-09 DIAGNOSIS — Z992 Dependence on renal dialysis: Secondary | ICD-10-CM | POA: Diagnosis not present

## 2022-01-09 DIAGNOSIS — D509 Iron deficiency anemia, unspecified: Secondary | ICD-10-CM | POA: Diagnosis not present

## 2022-01-09 DIAGNOSIS — N2581 Secondary hyperparathyroidism of renal origin: Secondary | ICD-10-CM | POA: Diagnosis not present

## 2022-01-09 DIAGNOSIS — D631 Anemia in chronic kidney disease: Secondary | ICD-10-CM | POA: Diagnosis not present

## 2022-01-09 DIAGNOSIS — T8249XA Other complication of vascular dialysis catheter, initial encounter: Secondary | ICD-10-CM | POA: Diagnosis not present

## 2022-01-09 DIAGNOSIS — D689 Coagulation defect, unspecified: Secondary | ICD-10-CM | POA: Diagnosis not present

## 2022-01-11 DIAGNOSIS — D689 Coagulation defect, unspecified: Secondary | ICD-10-CM | POA: Diagnosis not present

## 2022-01-11 DIAGNOSIS — D509 Iron deficiency anemia, unspecified: Secondary | ICD-10-CM | POA: Diagnosis not present

## 2022-01-11 DIAGNOSIS — N2581 Secondary hyperparathyroidism of renal origin: Secondary | ICD-10-CM | POA: Diagnosis not present

## 2022-01-11 DIAGNOSIS — T8249XA Other complication of vascular dialysis catheter, initial encounter: Secondary | ICD-10-CM | POA: Diagnosis not present

## 2022-01-11 DIAGNOSIS — D631 Anemia in chronic kidney disease: Secondary | ICD-10-CM | POA: Diagnosis not present

## 2022-01-11 DIAGNOSIS — N186 End stage renal disease: Secondary | ICD-10-CM | POA: Diagnosis not present

## 2022-01-11 DIAGNOSIS — Z992 Dependence on renal dialysis: Secondary | ICD-10-CM | POA: Diagnosis not present

## 2022-01-13 DIAGNOSIS — D689 Coagulation defect, unspecified: Secondary | ICD-10-CM | POA: Diagnosis not present

## 2022-01-13 DIAGNOSIS — D509 Iron deficiency anemia, unspecified: Secondary | ICD-10-CM | POA: Diagnosis not present

## 2022-01-13 DIAGNOSIS — Z992 Dependence on renal dialysis: Secondary | ICD-10-CM | POA: Diagnosis not present

## 2022-01-13 DIAGNOSIS — N186 End stage renal disease: Secondary | ICD-10-CM | POA: Diagnosis not present

## 2022-01-13 DIAGNOSIS — N2581 Secondary hyperparathyroidism of renal origin: Secondary | ICD-10-CM | POA: Diagnosis not present

## 2022-01-13 DIAGNOSIS — D631 Anemia in chronic kidney disease: Secondary | ICD-10-CM | POA: Diagnosis not present

## 2022-01-13 DIAGNOSIS — T8249XA Other complication of vascular dialysis catheter, initial encounter: Secondary | ICD-10-CM | POA: Diagnosis not present

## 2022-01-16 DIAGNOSIS — N186 End stage renal disease: Secondary | ICD-10-CM | POA: Diagnosis not present

## 2022-01-16 DIAGNOSIS — N2581 Secondary hyperparathyroidism of renal origin: Secondary | ICD-10-CM | POA: Diagnosis not present

## 2022-01-16 DIAGNOSIS — D689 Coagulation defect, unspecified: Secondary | ICD-10-CM | POA: Diagnosis not present

## 2022-01-16 DIAGNOSIS — Z992 Dependence on renal dialysis: Secondary | ICD-10-CM | POA: Diagnosis not present

## 2022-01-16 DIAGNOSIS — T8249XA Other complication of vascular dialysis catheter, initial encounter: Secondary | ICD-10-CM | POA: Diagnosis not present

## 2022-01-16 DIAGNOSIS — D509 Iron deficiency anemia, unspecified: Secondary | ICD-10-CM | POA: Diagnosis not present

## 2022-01-16 DIAGNOSIS — D631 Anemia in chronic kidney disease: Secondary | ICD-10-CM | POA: Diagnosis not present

## 2022-01-18 DIAGNOSIS — N186 End stage renal disease: Secondary | ICD-10-CM | POA: Diagnosis not present

## 2022-01-18 DIAGNOSIS — T8249XA Other complication of vascular dialysis catheter, initial encounter: Secondary | ICD-10-CM | POA: Diagnosis not present

## 2022-01-18 DIAGNOSIS — Z992 Dependence on renal dialysis: Secondary | ICD-10-CM | POA: Diagnosis not present

## 2022-01-18 DIAGNOSIS — N2581 Secondary hyperparathyroidism of renal origin: Secondary | ICD-10-CM | POA: Diagnosis not present

## 2022-01-18 DIAGNOSIS — D631 Anemia in chronic kidney disease: Secondary | ICD-10-CM | POA: Diagnosis not present

## 2022-01-18 DIAGNOSIS — D689 Coagulation defect, unspecified: Secondary | ICD-10-CM | POA: Diagnosis not present

## 2022-01-18 DIAGNOSIS — D509 Iron deficiency anemia, unspecified: Secondary | ICD-10-CM | POA: Diagnosis not present

## 2022-01-18 NOTE — Progress Notes (Signed)
Postoperative Access Visit   History of Present Illness   Robert Lucas is a 81 y.o. year old male who presents for postoperative follow-up for: Left brachiocephalic fistula branch ligation By Dr. Virl Cagey on 11/22/21. The patient's wounds are well healed.  The patient notes no steal symptoms. Says he has been trying to exercise his left arm  He currently dialyzes via right IJ TDC on MWF at [facility] Location   Physical Examination   Vitals:   01/19/22 0837  BP: (!) 118/58  Pulse: 80  Resp: 20  Temp: 98.6 F (37 C)  TempSrc: Temporal  SpO2: 98%  Weight: 147 lb 14.4 oz (67.1 kg)  Height: 5\' 3"  (1.6 m)   Body mass index is 26.2 kg/m.  left arm Incision is well healed, 2+ radial pulse, hand grip is 5/5, sensation in digits is intact, palpable thrill, bruit can be auscultated    Non Invasive Studies:    Findings:  +--------------------+----------+-----------------+--------+  AVF                 PSV (cm/s)Flow Vol (mL/min)Comments  +--------------------+----------+-----------------+--------+  Native artery inflow   123           639                 +--------------------+----------+-----------------+--------+  AVF Anastomosis        310                               +--------------------+----------+-----------------+--------+    +------------+----------+-------------+-----------+-----------------+  OUTFLOW VEINPSV (cm/s)Diameter (cm)Depth (cm)     Describe       +------------+----------+-------------+-----------+-----------------+  Shoulder       107        0.43        0.70                       +------------+----------+-------------+-----------+-----------------+  Prox UA        113        0.40        0.41                       +------------+----------+-------------+-----------+-----------------+  Mid UA         157        0.34        0.41                        +------------+----------+-------------+-----------+-----------------+  Dist UA     318 / 376  0.47 / 0.32 0.36 / 0.41                   +------------+----------+-------------+-----------+-----------------+  AC Fossa       568        0.26        0.37    slight narrowning  +------------+----------+-------------+-----------+-----------------+       Summary:  Patent arteriovenous fistula with slight increase of velocity at area of narrowing in the distal outflow vein..   Medical Decision Making   Robert Lucas is a 81 y.o. year old male who presents s/p Left brachiocephalic fistula branch ligation By Dr. Virl Cagey on 11/22/21. The incisions are well healed. No signs or symptoms of steal. Fistula has good thrill. Duplex today shows patent fistula still slow to mature with lower volume flow. Discussed with patient option  for close interval follow up and increasing his exercises vs allowing dialysis center to attempt to access fistula. He would like to allow them to try to cannulate his fistula to see if it will function okay. He understands that if there continues to be difficulties he will likely need to be evaluated for a new access. At this time he will follow up as needed or if there are any new issues or concerns.    Karoline Caldwell, PA-C [facility] [place] Office: [phone]  Clinic MD: Scot Dock

## 2022-01-19 ENCOUNTER — Ambulatory Visit (HOSPITAL_COMMUNITY)
Admission: RE | Admit: 2022-01-19 | Discharge: 2022-01-19 | Disposition: A | Discharge status: Home / Self Care | Payer: Medicare Other | Source: Ambulatory Visit | Attending: Vascular Surgery | Admitting: Vascular Surgery

## 2022-01-19 ENCOUNTER — Ambulatory Visit (INDEPENDENT_AMBULATORY_CARE_PROVIDER_SITE_OTHER): Payer: Medicare Other | Admitting: Physician Assistant

## 2022-01-19 ENCOUNTER — Encounter: Payer: Self-pay | Admitting: Physician Assistant

## 2022-01-19 VITALS — BP 118/58 | HR 80 | Temp 98.6°F | Resp 20 | Ht 63.0 in | Wt 147.9 lb

## 2022-01-19 DIAGNOSIS — N186 End stage renal disease: Secondary | ICD-10-CM

## 2022-01-19 DIAGNOSIS — Z992 Dependence on renal dialysis: Secondary | ICD-10-CM

## 2022-01-20 DIAGNOSIS — T8249XA Other complication of vascular dialysis catheter, initial encounter: Secondary | ICD-10-CM | POA: Diagnosis not present

## 2022-01-20 DIAGNOSIS — D689 Coagulation defect, unspecified: Secondary | ICD-10-CM | POA: Diagnosis not present

## 2022-01-20 DIAGNOSIS — D509 Iron deficiency anemia, unspecified: Secondary | ICD-10-CM | POA: Diagnosis not present

## 2022-01-20 DIAGNOSIS — D631 Anemia in chronic kidney disease: Secondary | ICD-10-CM | POA: Diagnosis not present

## 2022-01-20 DIAGNOSIS — Z992 Dependence on renal dialysis: Secondary | ICD-10-CM | POA: Diagnosis not present

## 2022-01-20 DIAGNOSIS — N2581 Secondary hyperparathyroidism of renal origin: Secondary | ICD-10-CM | POA: Diagnosis not present

## 2022-01-20 DIAGNOSIS — N186 End stage renal disease: Secondary | ICD-10-CM | POA: Diagnosis not present

## 2022-01-23 DIAGNOSIS — D509 Iron deficiency anemia, unspecified: Secondary | ICD-10-CM | POA: Diagnosis not present

## 2022-01-23 DIAGNOSIS — D631 Anemia in chronic kidney disease: Secondary | ICD-10-CM | POA: Diagnosis not present

## 2022-01-23 DIAGNOSIS — Z992 Dependence on renal dialysis: Secondary | ICD-10-CM | POA: Diagnosis not present

## 2022-01-23 DIAGNOSIS — D689 Coagulation defect, unspecified: Secondary | ICD-10-CM | POA: Diagnosis not present

## 2022-01-23 DIAGNOSIS — N2581 Secondary hyperparathyroidism of renal origin: Secondary | ICD-10-CM | POA: Diagnosis not present

## 2022-01-23 DIAGNOSIS — N186 End stage renal disease: Secondary | ICD-10-CM | POA: Diagnosis not present

## 2022-01-23 DIAGNOSIS — T8249XA Other complication of vascular dialysis catheter, initial encounter: Secondary | ICD-10-CM | POA: Diagnosis not present

## 2022-01-24 ENCOUNTER — Ambulatory Visit: Payer: Medicare Other

## 2022-01-24 DIAGNOSIS — I5020 Unspecified systolic (congestive) heart failure: Secondary | ICD-10-CM

## 2022-01-25 DIAGNOSIS — N186 End stage renal disease: Secondary | ICD-10-CM | POA: Diagnosis not present

## 2022-01-25 DIAGNOSIS — D689 Coagulation defect, unspecified: Secondary | ICD-10-CM | POA: Diagnosis not present

## 2022-01-25 DIAGNOSIS — D509 Iron deficiency anemia, unspecified: Secondary | ICD-10-CM | POA: Diagnosis not present

## 2022-01-25 DIAGNOSIS — Z992 Dependence on renal dialysis: Secondary | ICD-10-CM | POA: Diagnosis not present

## 2022-01-25 DIAGNOSIS — N2581 Secondary hyperparathyroidism of renal origin: Secondary | ICD-10-CM | POA: Diagnosis not present

## 2022-01-25 DIAGNOSIS — D631 Anemia in chronic kidney disease: Secondary | ICD-10-CM | POA: Diagnosis not present

## 2022-01-25 DIAGNOSIS — T8249XA Other complication of vascular dialysis catheter, initial encounter: Secondary | ICD-10-CM | POA: Diagnosis not present

## 2022-01-27 ENCOUNTER — Ambulatory Visit: Payer: Self-pay

## 2022-01-27 DIAGNOSIS — N186 End stage renal disease: Secondary | ICD-10-CM | POA: Diagnosis not present

## 2022-01-27 DIAGNOSIS — D509 Iron deficiency anemia, unspecified: Secondary | ICD-10-CM | POA: Diagnosis not present

## 2022-01-27 DIAGNOSIS — D631 Anemia in chronic kidney disease: Secondary | ICD-10-CM | POA: Diagnosis not present

## 2022-01-27 DIAGNOSIS — N2581 Secondary hyperparathyroidism of renal origin: Secondary | ICD-10-CM | POA: Diagnosis not present

## 2022-01-27 DIAGNOSIS — D689 Coagulation defect, unspecified: Secondary | ICD-10-CM | POA: Diagnosis not present

## 2022-01-27 DIAGNOSIS — T8249XA Other complication of vascular dialysis catheter, initial encounter: Secondary | ICD-10-CM | POA: Diagnosis not present

## 2022-01-27 DIAGNOSIS — Z992 Dependence on renal dialysis: Secondary | ICD-10-CM | POA: Diagnosis not present

## 2022-01-27 NOTE — Patient Outreach (Signed)
  Care Management   Outreach Note  01/27/2022 Name: [name] MRN: [id number] DOB: 1941/03/07  An unsuccessful telephone outreach was attempted today. The patient was referred to the case management team for assistance with care management and care coordination.   Follow Up Plan:  The care management team will reach out to the patient again over the next 10 days.   Daneen Schick, BSW, CDP Social Worker, Certified Dementia Practitioner Care Coordination [phone]

## 2022-01-30 ENCOUNTER — Ambulatory Visit: Payer: Self-pay

## 2022-01-30 DIAGNOSIS — Z992 Dependence on renal dialysis: Secondary | ICD-10-CM | POA: Diagnosis not present

## 2022-01-30 DIAGNOSIS — D631 Anemia in chronic kidney disease: Secondary | ICD-10-CM | POA: Diagnosis not present

## 2022-01-30 DIAGNOSIS — D689 Coagulation defect, unspecified: Secondary | ICD-10-CM | POA: Diagnosis not present

## 2022-01-30 DIAGNOSIS — T8249XA Other complication of vascular dialysis catheter, initial encounter: Secondary | ICD-10-CM | POA: Diagnosis not present

## 2022-01-30 DIAGNOSIS — D509 Iron deficiency anemia, unspecified: Secondary | ICD-10-CM | POA: Diagnosis not present

## 2022-01-30 DIAGNOSIS — N2581 Secondary hyperparathyroidism of renal origin: Secondary | ICD-10-CM | POA: Diagnosis not present

## 2022-01-30 DIAGNOSIS — N186 End stage renal disease: Secondary | ICD-10-CM | POA: Diagnosis not present

## 2022-01-30 DIAGNOSIS — E1129 Type 2 diabetes mellitus with other diabetic kidney complication: Secondary | ICD-10-CM | POA: Diagnosis not present

## 2022-01-30 NOTE — Progress Notes (Signed)
Called patient, NA LMAM

## 2022-01-30 NOTE — Patient Outreach (Signed)
  Care Coordination   Initial Visit Note   01/30/2022 Name: Robert Lucas MRN: [id number] DOB: 03-21-1941  Robert Lucas is a 81 y.o. year old male who sees [name], NP for primary care. I spoke with  Robert Lucas by phone today  What matters to the patients health and wellness today?  To continue to feel better with dialysis   Goals Addressed   None     SDOH assessments and interventions completed:   Yes SDOH Interventions Today    Flowsheet Row Most Recent Value  SDOH Interventions   Food Insecurity Interventions Intervention Not Indicated  Housing Interventions Intervention Not Indicated  Transportation Interventions Intervention Not Indicated       Care Coordination Interventions Activated:  No Care Coordination Interventions:  No, not indicated  Follow up plan: No further intervention required.  Encounter Outcome:  Pt. Visit Completed  Robert Lucas, BSW, CDP Social Worker, Certified Dementia Practitioner Care Coordination [phone]

## 2022-01-30 NOTE — Patient Instructions (Signed)
Visit Information  Thank you for taking time to visit with me today. Please don't hesitate to contact me if I can be of assistance to you.   Following are the goals we discussed today:   Goals Addressed   None     Please call the care guide team at [phone] if you need to schedule an appointment with me.  If you are experiencing a Mental Health or Behavioral Health Crisis or need someone to talk to, please call 1-800-273-TALK (toll free, 24 hour hotline)  The patient verbalized understanding of instructions, educational materials, and care plan provided today and DECLINED offer to receive copy of patient instructions, educational materials, and care plan.   No further follow up required: Please contact me as needed.  Ossie Beltran, BSW, CDP Social Worker, Certified Dementia Practitioner Care Coordination [phone]          

## 2022-01-30 NOTE — Progress Notes (Signed)
Called pt no answer left a vm

## 2022-01-30 NOTE — Progress Notes (Signed)
Pt called back and was informed about is echo pt understood

## 2022-02-01 DIAGNOSIS — N2581 Secondary hyperparathyroidism of renal origin: Secondary | ICD-10-CM | POA: Diagnosis not present

## 2022-02-01 DIAGNOSIS — D689 Coagulation defect, unspecified: Secondary | ICD-10-CM | POA: Diagnosis not present

## 2022-02-01 DIAGNOSIS — D631 Anemia in chronic kidney disease: Secondary | ICD-10-CM | POA: Diagnosis not present

## 2022-02-01 DIAGNOSIS — Z992 Dependence on renal dialysis: Secondary | ICD-10-CM | POA: Diagnosis not present

## 2022-02-01 DIAGNOSIS — T8249XA Other complication of vascular dialysis catheter, initial encounter: Secondary | ICD-10-CM | POA: Diagnosis not present

## 2022-02-01 DIAGNOSIS — N186 End stage renal disease: Secondary | ICD-10-CM | POA: Diagnosis not present

## 2022-02-02 DIAGNOSIS — Z992 Dependence on renal dialysis: Secondary | ICD-10-CM | POA: Diagnosis not present

## 2022-02-02 DIAGNOSIS — N186 End stage renal disease: Secondary | ICD-10-CM | POA: Diagnosis not present

## 2022-02-02 DIAGNOSIS — T8249XA Other complication of vascular dialysis catheter, initial encounter: Secondary | ICD-10-CM | POA: Diagnosis not present

## 2022-02-02 DIAGNOSIS — D689 Coagulation defect, unspecified: Secondary | ICD-10-CM | POA: Diagnosis not present

## 2022-02-02 DIAGNOSIS — N2581 Secondary hyperparathyroidism of renal origin: Secondary | ICD-10-CM | POA: Diagnosis not present

## 2022-02-02 DIAGNOSIS — D631 Anemia in chronic kidney disease: Secondary | ICD-10-CM | POA: Diagnosis not present

## 2022-02-06 DIAGNOSIS — N2581 Secondary hyperparathyroidism of renal origin: Secondary | ICD-10-CM | POA: Diagnosis not present

## 2022-02-06 DIAGNOSIS — D631 Anemia in chronic kidney disease: Secondary | ICD-10-CM | POA: Diagnosis not present

## 2022-02-06 DIAGNOSIS — N186 End stage renal disease: Secondary | ICD-10-CM | POA: Diagnosis not present

## 2022-02-06 DIAGNOSIS — T8249XA Other complication of vascular dialysis catheter, initial encounter: Secondary | ICD-10-CM | POA: Diagnosis not present

## 2022-02-06 DIAGNOSIS — Z992 Dependence on renal dialysis: Secondary | ICD-10-CM | POA: Diagnosis not present

## 2022-02-06 DIAGNOSIS — D689 Coagulation defect, unspecified: Secondary | ICD-10-CM | POA: Diagnosis not present

## 2022-02-08 DIAGNOSIS — Z992 Dependence on renal dialysis: Secondary | ICD-10-CM | POA: Diagnosis not present

## 2022-02-08 DIAGNOSIS — D689 Coagulation defect, unspecified: Secondary | ICD-10-CM | POA: Diagnosis not present

## 2022-02-08 DIAGNOSIS — T8249XA Other complication of vascular dialysis catheter, initial encounter: Secondary | ICD-10-CM | POA: Diagnosis not present

## 2022-02-08 DIAGNOSIS — N186 End stage renal disease: Secondary | ICD-10-CM | POA: Diagnosis not present

## 2022-02-08 DIAGNOSIS — D631 Anemia in chronic kidney disease: Secondary | ICD-10-CM | POA: Diagnosis not present

## 2022-02-08 DIAGNOSIS — N2581 Secondary hyperparathyroidism of renal origin: Secondary | ICD-10-CM | POA: Diagnosis not present

## 2022-02-10 DIAGNOSIS — Z992 Dependence on renal dialysis: Secondary | ICD-10-CM | POA: Diagnosis not present

## 2022-02-10 DIAGNOSIS — D689 Coagulation defect, unspecified: Secondary | ICD-10-CM | POA: Diagnosis not present

## 2022-02-10 DIAGNOSIS — N186 End stage renal disease: Secondary | ICD-10-CM | POA: Diagnosis not present

## 2022-02-10 DIAGNOSIS — N2581 Secondary hyperparathyroidism of renal origin: Secondary | ICD-10-CM | POA: Diagnosis not present

## 2022-02-10 DIAGNOSIS — T8249XA Other complication of vascular dialysis catheter, initial encounter: Secondary | ICD-10-CM | POA: Diagnosis not present

## 2022-02-10 DIAGNOSIS — D631 Anemia in chronic kidney disease: Secondary | ICD-10-CM | POA: Diagnosis not present

## 2022-02-13 DIAGNOSIS — N2581 Secondary hyperparathyroidism of renal origin: Secondary | ICD-10-CM | POA: Diagnosis not present

## 2022-02-13 DIAGNOSIS — N186 End stage renal disease: Secondary | ICD-10-CM | POA: Diagnosis not present

## 2022-02-13 DIAGNOSIS — Z992 Dependence on renal dialysis: Secondary | ICD-10-CM | POA: Diagnosis not present

## 2022-02-13 DIAGNOSIS — D689 Coagulation defect, unspecified: Secondary | ICD-10-CM | POA: Diagnosis not present

## 2022-02-13 DIAGNOSIS — D631 Anemia in chronic kidney disease: Secondary | ICD-10-CM | POA: Diagnosis not present

## 2022-02-13 DIAGNOSIS — T8249XA Other complication of vascular dialysis catheter, initial encounter: Secondary | ICD-10-CM | POA: Diagnosis not present

## 2022-02-15 DIAGNOSIS — N2581 Secondary hyperparathyroidism of renal origin: Secondary | ICD-10-CM | POA: Diagnosis not present

## 2022-02-15 DIAGNOSIS — N186 End stage renal disease: Secondary | ICD-10-CM | POA: Diagnosis not present

## 2022-02-15 DIAGNOSIS — Z992 Dependence on renal dialysis: Secondary | ICD-10-CM | POA: Diagnosis not present

## 2022-02-15 DIAGNOSIS — T8249XA Other complication of vascular dialysis catheter, initial encounter: Secondary | ICD-10-CM | POA: Diagnosis not present

## 2022-02-15 DIAGNOSIS — D689 Coagulation defect, unspecified: Secondary | ICD-10-CM | POA: Diagnosis not present

## 2022-02-15 DIAGNOSIS — D631 Anemia in chronic kidney disease: Secondary | ICD-10-CM | POA: Diagnosis not present

## 2022-02-17 DIAGNOSIS — T8249XA Other complication of vascular dialysis catheter, initial encounter: Secondary | ICD-10-CM | POA: Diagnosis not present

## 2022-02-17 DIAGNOSIS — Z992 Dependence on renal dialysis: Secondary | ICD-10-CM | POA: Diagnosis not present

## 2022-02-17 DIAGNOSIS — N2581 Secondary hyperparathyroidism of renal origin: Secondary | ICD-10-CM | POA: Diagnosis not present

## 2022-02-17 DIAGNOSIS — D689 Coagulation defect, unspecified: Secondary | ICD-10-CM | POA: Diagnosis not present

## 2022-02-17 DIAGNOSIS — D631 Anemia in chronic kidney disease: Secondary | ICD-10-CM | POA: Diagnosis not present

## 2022-02-17 DIAGNOSIS — N186 End stage renal disease: Secondary | ICD-10-CM | POA: Diagnosis not present

## 2022-02-18 DIAGNOSIS — Z992 Dependence on renal dialysis: Secondary | ICD-10-CM | POA: Diagnosis not present

## 2022-02-18 DIAGNOSIS — N2581 Secondary hyperparathyroidism of renal origin: Secondary | ICD-10-CM | POA: Diagnosis not present

## 2022-02-18 DIAGNOSIS — D689 Coagulation defect, unspecified: Secondary | ICD-10-CM | POA: Diagnosis not present

## 2022-02-18 DIAGNOSIS — T8249XA Other complication of vascular dialysis catheter, initial encounter: Secondary | ICD-10-CM | POA: Diagnosis not present

## 2022-02-18 DIAGNOSIS — N186 End stage renal disease: Secondary | ICD-10-CM | POA: Diagnosis not present

## 2022-02-18 DIAGNOSIS — D631 Anemia in chronic kidney disease: Secondary | ICD-10-CM | POA: Diagnosis not present

## 2022-02-20 DIAGNOSIS — T8249XA Other complication of vascular dialysis catheter, initial encounter: Secondary | ICD-10-CM | POA: Diagnosis not present

## 2022-02-20 DIAGNOSIS — Z992 Dependence on renal dialysis: Secondary | ICD-10-CM | POA: Diagnosis not present

## 2022-02-20 DIAGNOSIS — N2581 Secondary hyperparathyroidism of renal origin: Secondary | ICD-10-CM | POA: Diagnosis not present

## 2022-02-20 DIAGNOSIS — D631 Anemia in chronic kidney disease: Secondary | ICD-10-CM | POA: Diagnosis not present

## 2022-02-20 DIAGNOSIS — N186 End stage renal disease: Secondary | ICD-10-CM | POA: Diagnosis not present

## 2022-02-20 DIAGNOSIS — D689 Coagulation defect, unspecified: Secondary | ICD-10-CM | POA: Diagnosis not present

## 2022-02-22 DIAGNOSIS — D689 Coagulation defect, unspecified: Secondary | ICD-10-CM | POA: Diagnosis not present

## 2022-02-22 DIAGNOSIS — T8249XA Other complication of vascular dialysis catheter, initial encounter: Secondary | ICD-10-CM | POA: Diagnosis not present

## 2022-02-22 DIAGNOSIS — Z992 Dependence on renal dialysis: Secondary | ICD-10-CM | POA: Diagnosis not present

## 2022-02-22 DIAGNOSIS — N2581 Secondary hyperparathyroidism of renal origin: Secondary | ICD-10-CM | POA: Diagnosis not present

## 2022-02-22 DIAGNOSIS — D631 Anemia in chronic kidney disease: Secondary | ICD-10-CM | POA: Diagnosis not present

## 2022-02-22 DIAGNOSIS — N186 End stage renal disease: Secondary | ICD-10-CM | POA: Diagnosis not present

## 2022-02-24 DIAGNOSIS — D689 Coagulation defect, unspecified: Secondary | ICD-10-CM | POA: Diagnosis not present

## 2022-02-24 DIAGNOSIS — N186 End stage renal disease: Secondary | ICD-10-CM | POA: Diagnosis not present

## 2022-02-24 DIAGNOSIS — Z992 Dependence on renal dialysis: Secondary | ICD-10-CM | POA: Diagnosis not present

## 2022-02-24 DIAGNOSIS — D631 Anemia in chronic kidney disease: Secondary | ICD-10-CM | POA: Diagnosis not present

## 2022-02-24 DIAGNOSIS — N2581 Secondary hyperparathyroidism of renal origin: Secondary | ICD-10-CM | POA: Diagnosis not present

## 2022-02-24 DIAGNOSIS — T8249XA Other complication of vascular dialysis catheter, initial encounter: Secondary | ICD-10-CM | POA: Diagnosis not present

## 2022-02-27 DIAGNOSIS — N2581 Secondary hyperparathyroidism of renal origin: Secondary | ICD-10-CM | POA: Diagnosis not present

## 2022-02-27 DIAGNOSIS — D689 Coagulation defect, unspecified: Secondary | ICD-10-CM | POA: Diagnosis not present

## 2022-02-27 DIAGNOSIS — Z992 Dependence on renal dialysis: Secondary | ICD-10-CM | POA: Diagnosis not present

## 2022-02-27 DIAGNOSIS — N186 End stage renal disease: Secondary | ICD-10-CM | POA: Diagnosis not present

## 2022-02-27 DIAGNOSIS — D631 Anemia in chronic kidney disease: Secondary | ICD-10-CM | POA: Diagnosis not present

## 2022-02-27 DIAGNOSIS — T8249XA Other complication of vascular dialysis catheter, initial encounter: Secondary | ICD-10-CM | POA: Diagnosis not present

## 2022-03-01 DIAGNOSIS — N186 End stage renal disease: Secondary | ICD-10-CM | POA: Diagnosis not present

## 2022-03-01 DIAGNOSIS — N2581 Secondary hyperparathyroidism of renal origin: Secondary | ICD-10-CM | POA: Diagnosis not present

## 2022-03-01 DIAGNOSIS — D689 Coagulation defect, unspecified: Secondary | ICD-10-CM | POA: Diagnosis not present

## 2022-03-01 DIAGNOSIS — D631 Anemia in chronic kidney disease: Secondary | ICD-10-CM | POA: Diagnosis not present

## 2022-03-01 DIAGNOSIS — Z992 Dependence on renal dialysis: Secondary | ICD-10-CM | POA: Diagnosis not present

## 2022-03-01 DIAGNOSIS — T8249XA Other complication of vascular dialysis catheter, initial encounter: Secondary | ICD-10-CM | POA: Diagnosis not present

## 2022-03-02 DIAGNOSIS — Z992 Dependence on renal dialysis: Secondary | ICD-10-CM | POA: Diagnosis not present

## 2022-03-02 DIAGNOSIS — N186 End stage renal disease: Secondary | ICD-10-CM | POA: Diagnosis not present

## 2022-03-02 DIAGNOSIS — E1129 Type 2 diabetes mellitus with other diabetic kidney complication: Secondary | ICD-10-CM | POA: Diagnosis not present

## 2022-03-03 DIAGNOSIS — D631 Anemia in chronic kidney disease: Secondary | ICD-10-CM | POA: Diagnosis not present

## 2022-03-03 DIAGNOSIS — N2581 Secondary hyperparathyroidism of renal origin: Secondary | ICD-10-CM | POA: Diagnosis not present

## 2022-03-03 DIAGNOSIS — N186 End stage renal disease: Secondary | ICD-10-CM | POA: Diagnosis not present

## 2022-03-03 DIAGNOSIS — T8249XA Other complication of vascular dialysis catheter, initial encounter: Secondary | ICD-10-CM | POA: Diagnosis not present

## 2022-03-03 DIAGNOSIS — D689 Coagulation defect, unspecified: Secondary | ICD-10-CM | POA: Diagnosis not present

## 2022-03-03 DIAGNOSIS — Z992 Dependence on renal dialysis: Secondary | ICD-10-CM | POA: Diagnosis not present

## 2022-03-06 DIAGNOSIS — D631 Anemia in chronic kidney disease: Secondary | ICD-10-CM | POA: Diagnosis not present

## 2022-03-06 DIAGNOSIS — N2581 Secondary hyperparathyroidism of renal origin: Secondary | ICD-10-CM | POA: Diagnosis not present

## 2022-03-06 DIAGNOSIS — N186 End stage renal disease: Secondary | ICD-10-CM | POA: Diagnosis not present

## 2022-03-06 DIAGNOSIS — T8249XA Other complication of vascular dialysis catheter, initial encounter: Secondary | ICD-10-CM | POA: Diagnosis not present

## 2022-03-06 DIAGNOSIS — Z992 Dependence on renal dialysis: Secondary | ICD-10-CM | POA: Diagnosis not present

## 2022-03-06 DIAGNOSIS — D689 Coagulation defect, unspecified: Secondary | ICD-10-CM | POA: Diagnosis not present

## 2022-03-08 DIAGNOSIS — N186 End stage renal disease: Secondary | ICD-10-CM | POA: Diagnosis not present

## 2022-03-08 DIAGNOSIS — T8249XA Other complication of vascular dialysis catheter, initial encounter: Secondary | ICD-10-CM | POA: Diagnosis not present

## 2022-03-08 DIAGNOSIS — N2581 Secondary hyperparathyroidism of renal origin: Secondary | ICD-10-CM | POA: Diagnosis not present

## 2022-03-08 DIAGNOSIS — Z992 Dependence on renal dialysis: Secondary | ICD-10-CM | POA: Diagnosis not present

## 2022-03-08 DIAGNOSIS — D689 Coagulation defect, unspecified: Secondary | ICD-10-CM | POA: Diagnosis not present

## 2022-03-08 DIAGNOSIS — D631 Anemia in chronic kidney disease: Secondary | ICD-10-CM | POA: Diagnosis not present

## 2022-03-10 DIAGNOSIS — N2581 Secondary hyperparathyroidism of renal origin: Secondary | ICD-10-CM | POA: Diagnosis not present

## 2022-03-10 DIAGNOSIS — D689 Coagulation defect, unspecified: Secondary | ICD-10-CM | POA: Diagnosis not present

## 2022-03-10 DIAGNOSIS — T8249XA Other complication of vascular dialysis catheter, initial encounter: Secondary | ICD-10-CM | POA: Diagnosis not present

## 2022-03-10 DIAGNOSIS — D631 Anemia in chronic kidney disease: Secondary | ICD-10-CM | POA: Diagnosis not present

## 2022-03-10 DIAGNOSIS — Z992 Dependence on renal dialysis: Secondary | ICD-10-CM | POA: Diagnosis not present

## 2022-03-10 DIAGNOSIS — N186 End stage renal disease: Secondary | ICD-10-CM | POA: Diagnosis not present

## 2022-03-13 DIAGNOSIS — D689 Coagulation defect, unspecified: Secondary | ICD-10-CM | POA: Diagnosis not present

## 2022-03-13 DIAGNOSIS — D631 Anemia in chronic kidney disease: Secondary | ICD-10-CM | POA: Diagnosis not present

## 2022-03-13 DIAGNOSIS — T8249XA Other complication of vascular dialysis catheter, initial encounter: Secondary | ICD-10-CM | POA: Diagnosis not present

## 2022-03-13 DIAGNOSIS — N2581 Secondary hyperparathyroidism of renal origin: Secondary | ICD-10-CM | POA: Diagnosis not present

## 2022-03-13 DIAGNOSIS — Z992 Dependence on renal dialysis: Secondary | ICD-10-CM | POA: Diagnosis not present

## 2022-03-13 DIAGNOSIS — N186 End stage renal disease: Secondary | ICD-10-CM | POA: Diagnosis not present

## 2022-03-15 DIAGNOSIS — N186 End stage renal disease: Secondary | ICD-10-CM | POA: Diagnosis not present

## 2022-03-15 DIAGNOSIS — N2581 Secondary hyperparathyroidism of renal origin: Secondary | ICD-10-CM | POA: Diagnosis not present

## 2022-03-15 DIAGNOSIS — T8249XA Other complication of vascular dialysis catheter, initial encounter: Secondary | ICD-10-CM | POA: Diagnosis not present

## 2022-03-15 DIAGNOSIS — D689 Coagulation defect, unspecified: Secondary | ICD-10-CM | POA: Diagnosis not present

## 2022-03-15 DIAGNOSIS — Z992 Dependence on renal dialysis: Secondary | ICD-10-CM | POA: Diagnosis not present

## 2022-03-15 DIAGNOSIS — D631 Anemia in chronic kidney disease: Secondary | ICD-10-CM | POA: Diagnosis not present

## 2022-03-16 ENCOUNTER — Encounter: Payer: Self-pay | Admitting: Cardiology

## 2022-03-16 ENCOUNTER — Ambulatory Visit: Payer: Medicare Other | Admitting: Cardiology

## 2022-03-16 ENCOUNTER — Other Ambulatory Visit: Payer: Medicare Other

## 2022-03-16 VITALS — BP 118/50 | HR 75 | Temp 98.0°F | Resp 16 | Ht 63.0 in | Wt 157.0 lb

## 2022-03-16 DIAGNOSIS — N186 End stage renal disease: Secondary | ICD-10-CM | POA: Diagnosis not present

## 2022-03-16 DIAGNOSIS — Z992 Dependence on renal dialysis: Secondary | ICD-10-CM | POA: Diagnosis not present

## 2022-03-16 DIAGNOSIS — I5020 Unspecified systolic (congestive) heart failure: Secondary | ICD-10-CM | POA: Diagnosis not present

## 2022-03-16 DIAGNOSIS — R000 Tachycardia, unspecified: Secondary | ICD-10-CM | POA: Diagnosis not present

## 2022-03-16 NOTE — Progress Notes (Signed)
Patient referred by [name], NP for coronary artery disease  Subjective:   Robert Lucas, male    DOB: 03-25-1941, 81 y.o.   MRN: [id number]   Chief Complaint  Patient presents with   HFrEF   Follow-up    3 month   Results    Echo   HPI  81 y.o. Caucasian male with with hypertension, hyperlipidemia, IDDM, CAD s/p anterior MI in 67s treated with TPA and PTCA, ESRD on HD, obesity, h/o gout, OSA, HFrEF  Patient is doing well, denies chest pain, shortness of breath, palpitations, leg edema, orthopnea, PND, TIA/syncope. His heart rate has been elevated lately in 80s.     Current Outpatient Medications:    acetaminophen (TYLENOL) 500 MG tablet, Take 500 mg by mouth every 6 (six) hours as needed for moderate pain or headache., Disp: , Rfl:    allopurinol (ZYLOPRIM) 100 MG tablet, Take 100 mg by mouth daily., Disp: , Rfl:    aspirin EC 81 MG tablet, Take 81 mg by mouth every evening., Disp: , Rfl:    atorvastatin (LIPITOR) 40 MG tablet, Take 40 mg by mouth at bedtime., Disp: , Rfl:    finasteride (PROSCAR) 5 MG tablet, Take 5 mg by mouth every evening., Disp: , Rfl:    fluticasone (FLONASE) 50 MCG/ACT nasal spray, Place 1 spray into both nostrils daily as needed for allergies or rhinitis., Disp: , Rfl:    gabapentin (NEURONTIN) 100 MG capsule, Take 100 mg by mouth 3 (three) times daily. (Patient not taking: Reported on 01/19/2022), Disp: , Rfl:    hydrALAZINE (APRESOLINE) 50 MG tablet, Take 1 tablet (50 mg total) by mouth in the morning and at bedtime. (Patient taking differently: Take 50 mg by mouth See admin instructions. Take 50 mg in the evening on M-W-F (dialysis days). Take 50 mg twice daily on Su-Tue-Thur-Sat (non-dialysis days)), Disp: 180 tablet, Rfl: 3   insulin aspart protamine - aspart (NOVOLOG MIX 70/30 FLEXPEN) (70-30) 100 UNIT/ML FlexPen, Inject 14-20 Units into the skin daily as needed (high blood sugar). If BS is <150=0 units, If BS is 150-200 units=14 units,  If BS is >201=20 units, Disp: , Rfl:    metoprolol succinate (TOPROL-XL) 50 MG 24 hr tablet, Take 0.5 tablets (25 mg total) by mouth daily. Take with or immediately following a meal., Disp: 30 tablet, Rfl: 0   oxyCODONE-acetaminophen (PERCOCET) 5-325 MG tablet, Take 1 tablet by mouth every 6 (six) hours as needed for severe pain. (Patient not taking: Reported on 01/19/2022), Disp: 8 tablet, Rfl: 0   tamsulosin (FLOMAX) 0.4 MG CAPS capsule, Take 1 capsule (0.4 mg total) by mouth daily after supper. (Patient taking differently: Take 0.4 mg by mouth at bedtime.), Disp: 30 capsule, Rfl: 0   VITAMIN D PO, Take by mouth. Administered at dialysis, Disp: , Rfl:     Cardiovascular studies:  EKG 03/16/2022: Sinus rhythm 84 bpm Frequent PAC, PVC Old anterior infarct Nonspecific T-abnormality  Echocardiogram 01/24/2022:  Left ventricle cavity is normal in size. Mild concentric hypertrophy of  the left ventricle.  Mild global and severe apical hypokinesis. LVEF  45-50%. LVEF 45-50%. Doppler evidence of grade I (impaired) diastolic  dysfunction, normal LAP.  Left atrial cavity is moderately dilated.  Trileaflet aortic valve with mild calcification. Trace aortic valve  stenosis. Vmax 1.9 m/sec, mean PG 8 mmHg, AVA 1.7 cm by continuity  equation. No regurgitation.  Mild calcification of the mitral valve annulus. Trace mitral  regurgitation. Trace tricuspid regurgitation.  Estimated pulmonary artery systolic pressure 25 Previous study on  11/15/2020, reported grade II diastolic dysfunction, estimated PASP 41  mmHg.   Echocardiogram 08/26/2021: 1. Left ventricular ejection fraction, by estimation, is 30 to 35%. The  left ventricle has moderately decreased function. The left ventricle has  no regional wall motion abnormalities. The left ventricular internal  cavity size was mildly dilated. Left ventricular diastolic parameters are  consistent with Grade II diastolic dysfunction (pseudonormalization).    2. Right ventricular systolic function is normal. The right ventricular  size is normal. There is severely elevated pulmonary artery systolic  pressure. The estimated right ventricular systolic pressure is 37.8 mmHg.   3. Left atrial size was moderately dilated.   4. The mitral valve is normal in structure. No evidence of mitral valve  regurgitation. No evidence of mitral stenosis.   5. Tricuspid valve regurgitation is mild to moderate.   6. The aortic valve is tricuspid. There is mild calcification of the  aortic valve. There is mild thickening of the aortic valve. Aortic valve  regurgitation is not visualized. Aortic valve sclerosis is present, with  no evidence of aortic valve stenosis.   7. The inferior vena cava is dilated in size with <50% respiratory  variability, suggesting right atrial pressure of 15 mmHg.   Cath 1990: Normal Left main, 99% stenosis proximal LAD, no significant disease CFX RCA, PTCA of LAD done   Recent labs: 09/07/2021: Glucose 308, BUN/Cr 78/4.87. EGFR 11. Na/K 135/4.2. Albumin 3.0. Phos 6.1. Rest of the CMP normal H/H 8/24. MCV 94. Platelets 156 HbA1C 6.3%   Review of Systems  Cardiovascular:  Positive for leg swelling (Mild, stable). Negative for chest pain, dyspnea on exertion, palpitations and syncope.  Musculoskeletal:  Positive for back pain (Radiating to left leg.  Also has left leg weakness.).        Vitals:   03/16/22 1020  BP: (!) 118/50  Pulse: 75  Resp: 16  Temp: 98 F (36.7 C)  SpO2: 96%     Body mass index is 27.81 kg/m. Filed Weights   03/16/22 1020  Weight: 157 lb (71.2 kg)     Objective:   Physical Exam Vitals and nursing note reviewed.  Constitutional:      Appearance: He is well-developed.  Neck:     Vascular: No JVD.  Cardiovascular:     Rate and Rhythm: Normal rate and regular rhythm.     Pulses: Intact distal pulses.     Heart sounds: Normal heart sounds. No murmur heard. Pulmonary:     Effort: Pulmonary  effort is normal.     Breath sounds: Normal breath sounds. No wheezing or rales.  Musculoskeletal:     Right lower leg: No edema.     Left lower leg: No edema.           Assessment & Recommendations:   81 y.o. Caucasian male with with hypertension, hyperlipidemia, IDDM, CAD s/p anterior MI in 1990s treated with TPA and PTCA, ESRD on HD, obesity, h/o gout, OSA, HFrEF  HFrEF: EF 45-50% (12/2021) with mild global and severe apical hypokinesis. Clinically euvolemic. Continue metoprolol succinate and hydralazine.  Coronary artery disease involving native coronary artery of native heart without angina pectoris No angina symptoms. Continue Aspirin, statin, metoprolol In absence of angina or dyspnea, ischemic workup is not necessary.  PAC: Frequent PAC. Will check 2 week cardiac telemetry.   F/u in 3 months   Nigel Mormon, MD Pager: [phone] Office: [phone]

## 2022-03-17 DIAGNOSIS — N2581 Secondary hyperparathyroidism of renal origin: Secondary | ICD-10-CM | POA: Diagnosis not present

## 2022-03-17 DIAGNOSIS — T8249XA Other complication of vascular dialysis catheter, initial encounter: Secondary | ICD-10-CM | POA: Diagnosis not present

## 2022-03-17 DIAGNOSIS — N186 End stage renal disease: Secondary | ICD-10-CM | POA: Diagnosis not present

## 2022-03-17 DIAGNOSIS — D689 Coagulation defect, unspecified: Secondary | ICD-10-CM | POA: Diagnosis not present

## 2022-03-17 DIAGNOSIS — D631 Anemia in chronic kidney disease: Secondary | ICD-10-CM | POA: Diagnosis not present

## 2022-03-17 DIAGNOSIS — Z992 Dependence on renal dialysis: Secondary | ICD-10-CM | POA: Diagnosis not present

## 2022-03-20 DIAGNOSIS — T8249XA Other complication of vascular dialysis catheter, initial encounter: Secondary | ICD-10-CM | POA: Diagnosis not present

## 2022-03-20 DIAGNOSIS — D631 Anemia in chronic kidney disease: Secondary | ICD-10-CM | POA: Diagnosis not present

## 2022-03-20 DIAGNOSIS — Z992 Dependence on renal dialysis: Secondary | ICD-10-CM | POA: Diagnosis not present

## 2022-03-20 DIAGNOSIS — D689 Coagulation defect, unspecified: Secondary | ICD-10-CM | POA: Diagnosis not present

## 2022-03-20 DIAGNOSIS — N186 End stage renal disease: Secondary | ICD-10-CM | POA: Diagnosis not present

## 2022-03-20 DIAGNOSIS — N2581 Secondary hyperparathyroidism of renal origin: Secondary | ICD-10-CM | POA: Diagnosis not present

## 2022-03-22 DIAGNOSIS — T8249XA Other complication of vascular dialysis catheter, initial encounter: Secondary | ICD-10-CM | POA: Diagnosis not present

## 2022-03-22 DIAGNOSIS — Z992 Dependence on renal dialysis: Secondary | ICD-10-CM | POA: Diagnosis not present

## 2022-03-22 DIAGNOSIS — N186 End stage renal disease: Secondary | ICD-10-CM | POA: Diagnosis not present

## 2022-03-22 DIAGNOSIS — D689 Coagulation defect, unspecified: Secondary | ICD-10-CM | POA: Diagnosis not present

## 2022-03-22 DIAGNOSIS — D631 Anemia in chronic kidney disease: Secondary | ICD-10-CM | POA: Diagnosis not present

## 2022-03-22 DIAGNOSIS — U071 COVID-19: Secondary | ICD-10-CM | POA: Diagnosis not present

## 2022-03-22 DIAGNOSIS — N2581 Secondary hyperparathyroidism of renal origin: Secondary | ICD-10-CM | POA: Diagnosis not present

## 2022-03-24 DIAGNOSIS — T8249XA Other complication of vascular dialysis catheter, initial encounter: Secondary | ICD-10-CM | POA: Diagnosis not present

## 2022-03-24 DIAGNOSIS — N2581 Secondary hyperparathyroidism of renal origin: Secondary | ICD-10-CM | POA: Diagnosis not present

## 2022-03-24 DIAGNOSIS — D689 Coagulation defect, unspecified: Secondary | ICD-10-CM | POA: Diagnosis not present

## 2022-03-24 DIAGNOSIS — Z992 Dependence on renal dialysis: Secondary | ICD-10-CM | POA: Diagnosis not present

## 2022-03-24 DIAGNOSIS — D631 Anemia in chronic kidney disease: Secondary | ICD-10-CM | POA: Diagnosis not present

## 2022-03-24 DIAGNOSIS — N186 End stage renal disease: Secondary | ICD-10-CM | POA: Diagnosis not present

## 2022-03-27 DIAGNOSIS — D631 Anemia in chronic kidney disease: Secondary | ICD-10-CM | POA: Diagnosis not present

## 2022-03-27 DIAGNOSIS — N186 End stage renal disease: Secondary | ICD-10-CM | POA: Diagnosis not present

## 2022-03-27 DIAGNOSIS — D689 Coagulation defect, unspecified: Secondary | ICD-10-CM | POA: Diagnosis not present

## 2022-03-27 DIAGNOSIS — Z992 Dependence on renal dialysis: Secondary | ICD-10-CM | POA: Diagnosis not present

## 2022-03-27 DIAGNOSIS — N2581 Secondary hyperparathyroidism of renal origin: Secondary | ICD-10-CM | POA: Diagnosis not present

## 2022-03-27 DIAGNOSIS — T8249XA Other complication of vascular dialysis catheter, initial encounter: Secondary | ICD-10-CM | POA: Diagnosis not present

## 2022-03-29 DIAGNOSIS — D689 Coagulation defect, unspecified: Secondary | ICD-10-CM | POA: Diagnosis not present

## 2022-03-29 DIAGNOSIS — T8249XA Other complication of vascular dialysis catheter, initial encounter: Secondary | ICD-10-CM | POA: Diagnosis not present

## 2022-03-29 DIAGNOSIS — Z992 Dependence on renal dialysis: Secondary | ICD-10-CM | POA: Diagnosis not present

## 2022-03-29 DIAGNOSIS — N186 End stage renal disease: Secondary | ICD-10-CM | POA: Diagnosis not present

## 2022-03-29 DIAGNOSIS — D631 Anemia in chronic kidney disease: Secondary | ICD-10-CM | POA: Diagnosis not present

## 2022-03-29 DIAGNOSIS — N2581 Secondary hyperparathyroidism of renal origin: Secondary | ICD-10-CM | POA: Diagnosis not present

## 2022-03-30 DIAGNOSIS — N189 Chronic kidney disease, unspecified: Secondary | ICD-10-CM | POA: Diagnosis not present

## 2022-03-31 DIAGNOSIS — T8249XA Other complication of vascular dialysis catheter, initial encounter: Secondary | ICD-10-CM | POA: Diagnosis not present

## 2022-03-31 DIAGNOSIS — N186 End stage renal disease: Secondary | ICD-10-CM | POA: Diagnosis not present

## 2022-03-31 DIAGNOSIS — D689 Coagulation defect, unspecified: Secondary | ICD-10-CM | POA: Diagnosis not present

## 2022-03-31 DIAGNOSIS — D631 Anemia in chronic kidney disease: Secondary | ICD-10-CM | POA: Diagnosis not present

## 2022-03-31 DIAGNOSIS — N2581 Secondary hyperparathyroidism of renal origin: Secondary | ICD-10-CM | POA: Diagnosis not present

## 2022-03-31 DIAGNOSIS — Z992 Dependence on renal dialysis: Secondary | ICD-10-CM | POA: Diagnosis not present

## 2022-04-01 DIAGNOSIS — N186 End stage renal disease: Secondary | ICD-10-CM | POA: Diagnosis not present

## 2022-04-01 DIAGNOSIS — E1129 Type 2 diabetes mellitus with other diabetic kidney complication: Secondary | ICD-10-CM | POA: Diagnosis not present

## 2022-04-01 DIAGNOSIS — Z992 Dependence on renal dialysis: Secondary | ICD-10-CM | POA: Diagnosis not present

## 2022-04-03 DIAGNOSIS — T8249XA Other complication of vascular dialysis catheter, initial encounter: Secondary | ICD-10-CM | POA: Diagnosis not present

## 2022-04-03 DIAGNOSIS — Z23 Encounter for immunization: Secondary | ICD-10-CM | POA: Diagnosis not present

## 2022-04-03 DIAGNOSIS — N186 End stage renal disease: Secondary | ICD-10-CM | POA: Diagnosis not present

## 2022-04-03 DIAGNOSIS — N2581 Secondary hyperparathyroidism of renal origin: Secondary | ICD-10-CM | POA: Diagnosis not present

## 2022-04-03 DIAGNOSIS — D631 Anemia in chronic kidney disease: Secondary | ICD-10-CM | POA: Diagnosis not present

## 2022-04-03 DIAGNOSIS — Z992 Dependence on renal dialysis: Secondary | ICD-10-CM | POA: Diagnosis not present

## 2022-04-03 DIAGNOSIS — D689 Coagulation defect, unspecified: Secondary | ICD-10-CM | POA: Diagnosis not present

## 2022-04-05 DIAGNOSIS — Z992 Dependence on renal dialysis: Secondary | ICD-10-CM | POA: Diagnosis not present

## 2022-04-05 DIAGNOSIS — T8249XA Other complication of vascular dialysis catheter, initial encounter: Secondary | ICD-10-CM | POA: Diagnosis not present

## 2022-04-05 DIAGNOSIS — N186 End stage renal disease: Secondary | ICD-10-CM | POA: Diagnosis not present

## 2022-04-05 DIAGNOSIS — D689 Coagulation defect, unspecified: Secondary | ICD-10-CM | POA: Diagnosis not present

## 2022-04-05 DIAGNOSIS — Z23 Encounter for immunization: Secondary | ICD-10-CM | POA: Diagnosis not present

## 2022-04-05 DIAGNOSIS — N2581 Secondary hyperparathyroidism of renal origin: Secondary | ICD-10-CM | POA: Diagnosis not present

## 2022-04-05 DIAGNOSIS — D631 Anemia in chronic kidney disease: Secondary | ICD-10-CM | POA: Diagnosis not present

## 2022-04-06 DIAGNOSIS — R000 Tachycardia, unspecified: Secondary | ICD-10-CM | POA: Diagnosis not present

## 2022-04-06 NOTE — Progress Notes (Signed)
     Access Visit   History of Present Illness   Robert Lucas is a 81 y.o. year old male who presents for postoperative follow-up for: Left brachiocephalic fistula creation, with subsequent balloon assisted maturation and branch ligation.  Unfortunately, Robert Lucas's fistula has failed to dilate.  Today, Robert Lucas was doing well, accompanied by his wife.  No complaints.  He currently dialyzes via right IJ TDC on MWF at [facility] Location   Physical Examination   There were no vitals filed for this visit.  There is no height or weight on file to calculate BMI.  left arm Incision is well healed, 2+ radial pulse, hand grip is 5/5, sensation in digits is intact, palpable thrill     Medical Decision Making   Robert Lucas is a 81 y.o. year old male who presents s/p Left brachiocephalic fistula creation with subsequent balloon assisted maturation, branch ligation.  Unfortunately, the fistula has failed to mature.   There appears to be an area of sclerotic vein that is resistant to venoplasty.  This point, [place] would benefit from new permanent HD access.  Have ordered new vein mapping to assess veins in bilateral upper extremities.  I will call him in 2 weeks once the studies have been completed to discuss new AV fistula versus AV graft.     Broadus John MD [facility] Office: [phone] Total time of patient care including pre-visit research, consultation, and documentation greater than 10 minutes

## 2022-04-07 ENCOUNTER — Ambulatory Visit (INDEPENDENT_AMBULATORY_CARE_PROVIDER_SITE_OTHER): Payer: Medicare Other | Admitting: Vascular Surgery

## 2022-04-07 ENCOUNTER — Other Ambulatory Visit: Payer: Self-pay

## 2022-04-07 ENCOUNTER — Encounter: Payer: Self-pay | Admitting: Vascular Surgery

## 2022-04-07 VITALS — BP 139/79 | HR 70 | Temp 98.2°F | Resp 20 | Ht 63.0 in | Wt 154.0 lb

## 2022-04-07 DIAGNOSIS — N186 End stage renal disease: Secondary | ICD-10-CM

## 2022-04-07 DIAGNOSIS — Z992 Dependence on renal dialysis: Secondary | ICD-10-CM

## 2022-04-07 DIAGNOSIS — Z23 Encounter for immunization: Secondary | ICD-10-CM | POA: Diagnosis not present

## 2022-04-07 DIAGNOSIS — N2581 Secondary hyperparathyroidism of renal origin: Secondary | ICD-10-CM | POA: Diagnosis not present

## 2022-04-07 DIAGNOSIS — D631 Anemia in chronic kidney disease: Secondary | ICD-10-CM | POA: Diagnosis not present

## 2022-04-07 DIAGNOSIS — T8249XA Other complication of vascular dialysis catheter, initial encounter: Secondary | ICD-10-CM | POA: Diagnosis not present

## 2022-04-07 DIAGNOSIS — D689 Coagulation defect, unspecified: Secondary | ICD-10-CM | POA: Diagnosis not present

## 2022-04-07 NOTE — Addendum Note (Signed)
Addended byDoylene Bode on: 04/07/2022 11:25 AM   Modules accepted: Orders

## 2022-04-07 NOTE — Addendum Note (Signed)
Addended byDoylene Bode on: 04/07/2022 11:17 AM   Modules accepted: Orders

## 2022-04-10 DIAGNOSIS — Z23 Encounter for immunization: Secondary | ICD-10-CM | POA: Diagnosis not present

## 2022-04-10 DIAGNOSIS — D689 Coagulation defect, unspecified: Secondary | ICD-10-CM | POA: Diagnosis not present

## 2022-04-10 DIAGNOSIS — D631 Anemia in chronic kidney disease: Secondary | ICD-10-CM | POA: Diagnosis not present

## 2022-04-10 DIAGNOSIS — N186 End stage renal disease: Secondary | ICD-10-CM | POA: Diagnosis not present

## 2022-04-10 DIAGNOSIS — N2581 Secondary hyperparathyroidism of renal origin: Secondary | ICD-10-CM | POA: Diagnosis not present

## 2022-04-10 DIAGNOSIS — Z992 Dependence on renal dialysis: Secondary | ICD-10-CM | POA: Diagnosis not present

## 2022-04-10 DIAGNOSIS — T8249XA Other complication of vascular dialysis catheter, initial encounter: Secondary | ICD-10-CM | POA: Diagnosis not present

## 2022-04-11 ENCOUNTER — Encounter (HOSPITAL_BASED_OUTPATIENT_CLINIC_OR_DEPARTMENT_OTHER): Payer: Self-pay

## 2022-04-11 ENCOUNTER — Emergency Department (HOSPITAL_BASED_OUTPATIENT_CLINIC_OR_DEPARTMENT_OTHER)
Admission: EM | Admit: 2022-04-11 | Discharge: 2022-04-11 | Disposition: A | Discharge status: Home / Self Care | Payer: Medicare Other | Attending: Emergency Medicine | Admitting: Emergency Medicine

## 2022-04-11 ENCOUNTER — Other Ambulatory Visit: Payer: Self-pay

## 2022-04-11 DIAGNOSIS — Y9301 Activity, walking, marching and hiking: Secondary | ICD-10-CM | POA: Diagnosis not present

## 2022-04-11 DIAGNOSIS — S51012A Laceration without foreign body of left elbow, initial encounter: Secondary | ICD-10-CM | POA: Insufficient documentation

## 2022-04-11 DIAGNOSIS — S41112A Laceration without foreign body of left upper arm, initial encounter: Secondary | ICD-10-CM | POA: Diagnosis not present

## 2022-04-11 DIAGNOSIS — W01190A Fall on same level from slipping, tripping and stumbling with subsequent striking against furniture, initial encounter: Secondary | ICD-10-CM | POA: Diagnosis not present

## 2022-04-11 MED ORDER — LIDOCAINE HCL (PF) 1 % IJ SOLN
10.0000 mL | Freq: Once | INTRAMUSCULAR | Status: AC
  Administered 2022-04-11: 10 mL via INTRADERMAL
  Filled 2022-04-11: qty 10

## 2022-04-11 NOTE — ED Provider Notes (Signed)
[place] EMERGENCY DEPT Provider Note   CSN: [id number] Arrival date & time: 04/11/22  9935     History Chief Complaint  Patient presents with   Fall    HPI Robert Lucas is a 81 y.o. male presenting for left elbow injury.  Patient was walking around a door when he got caught on it and fell with his left elbow having a deep gash from the door hinge.  He denies fevers or chills, nausea vomiting, syncope shortness of breath.  He did not hit his head.  He denies blood thinner use.  He has no other symptoms.  Is full range of motion of left elbow.  Denies any ongoing pain at the site.  Patient has been ambulatory since the accident.  Tetanus updated in 2021.   Patient's recorded medical, surgical, social, medication list and allergies were reviewed in the Snapshot window as part of the initial history.   Review of Systems   Review of Systems  Constitutional:  Negative for chills and fever.  HENT:  Negative for ear pain and sore throat.   Eyes:  Negative for pain and visual disturbance.  Respiratory:  Negative for cough and shortness of breath.   Cardiovascular:  Negative for chest pain and palpitations.  Gastrointestinal:  Negative for abdominal pain and vomiting.  Genitourinary:  Negative for dysuria and hematuria.  Musculoskeletal:  Negative for arthralgias and back pain.  Skin:  Negative for color change and rash.  Neurological:  Negative for seizures and syncope.  All other systems reviewed and are negative.   Physical Exam Updated Vital Signs BP (!) 182/91   Pulse 76   Temp 97.7 F (36.5 C) (Oral)   Resp 17   Ht 5\' 3"  (1.6 m)   Wt 69.9 kg   SpO2 99%   BMI 27.28 kg/m  Physical Exam Musculoskeletal:        General: Signs of injury (3cm L shaped laceration to left elbow. No visibile tendinous injury under full range of motion) present.      ED Course/ Medical Decision Making/ A&P    Procedures .Marland KitchenLaceration Repair  Date/Time: 04/11/2022  7:50 AM  Performed by: Tretha Sciara, MD Authorized by: Tretha Sciara, MD   Consent:    Consent obtained:  Verbal   Consent given by:  Patient   Risks discussed:  Need for additional repair, infection, pain and tendon damage Laceration details:    Location:  Shoulder/arm   Shoulder/arm location:  L elbow   Length (cm):  3 Pre-procedure details:    Preparation:  Patient was prepped and draped in usual sterile fashion Skin repair:    Repair method:  Sutures   Suture size:  4-0   Suture material:  Nylon   Suture technique:  Simple interrupted   Number of sutures:  4 Approximation:    Approximation:  Close Repair type:    Repair type:  Simple Post-procedure details:    Dressing:  Bulky dressing   Procedure completion:  Tolerated    Medications Ordered in ED Medications  lidocaine (PF) (XYLOCAINE) 1 % injection 10 mL (10 mLs Intradermal Given 04/11/22 0724)   Medical Decision Making:   Robert Lucas is a 81 y.o. male who presented to the ED today with a 3 cm laceration to their left elbow. GLF. They are neurovascularly intact. Tetanus is UTD.  Reviewed and confirmed nursing documentation for past medical history, family history, social history.   On my initial exam, the pt was HDS  in NAD.     Assessment:   Laceration that will require repair.?  Given mechanism of injury, underlying fracture, deformity, wound site infection and systemic injury were all considered inconsistent with this current history of present illness and physical exam.  Plan:  Lac repair as noted in procedures Tetanus UTD Considered underlying fracture, however patient has no range of motion limitations and denies any pain at the site, shared medical decision making with patient regarding further work-up for traumatic injury including blunt intracranial versus traumatic fracture.  Patient denied any concern for these injuries and feels comfortable with laceration repair only at this time. Wound  care with standard precautions  and antibiotic ointment   Radiology-Images reviewed personally and agree with radiology report  No results found.  Disposition:  Based on the above findings, I believe patient is stable for discharge.    Patient/family educated about specific return precautions for given chief complaint and symptoms.  Patient/family educated about follow-up with PCP.     Patient/family expressed understanding of return precautions and need for follow-up. Patient spoken to regarding all imaging and laboratory results and appropriate follow up for these results. All education provided in verbal form with additional information in written form. Time was allowed for answering of patient questions. Patient discharged.    Emergency Department Medication Summary:   Medications  lidocaine (PF) (XYLOCAINE) 1 % injection 10 mL (10 mLs Intradermal Given 04/11/22 0724)         Clinical Impression:  1. Arm laceration, left, initial encounter      Discharge   Final Clinical Impression(s) / ED Diagnoses Final diagnoses:  Arm laceration, left, initial encounter    Rx / DC Orders ED Discharge Orders     None         Tretha Sciara, MD 04/11/22 [phone]

## 2022-04-11 NOTE — ED Triage Notes (Signed)
Pt states after getting up this morning, he slipped and fell. As he fell his left arm struck a hinge of door. Pt has a laceration to left elbow and abrasion to left upper arm. PT denies hitting his head, no loc, no blood thinners.

## 2022-04-12 DIAGNOSIS — Z992 Dependence on renal dialysis: Secondary | ICD-10-CM | POA: Diagnosis not present

## 2022-04-12 DIAGNOSIS — Z23 Encounter for immunization: Secondary | ICD-10-CM | POA: Diagnosis not present

## 2022-04-12 DIAGNOSIS — T8249XA Other complication of vascular dialysis catheter, initial encounter: Secondary | ICD-10-CM | POA: Diagnosis not present

## 2022-04-12 DIAGNOSIS — N2581 Secondary hyperparathyroidism of renal origin: Secondary | ICD-10-CM | POA: Diagnosis not present

## 2022-04-12 DIAGNOSIS — D689 Coagulation defect, unspecified: Secondary | ICD-10-CM | POA: Diagnosis not present

## 2022-04-12 DIAGNOSIS — D631 Anemia in chronic kidney disease: Secondary | ICD-10-CM | POA: Diagnosis not present

## 2022-04-12 DIAGNOSIS — N186 End stage renal disease: Secondary | ICD-10-CM | POA: Diagnosis not present

## 2022-04-14 DIAGNOSIS — Z992 Dependence on renal dialysis: Secondary | ICD-10-CM | POA: Diagnosis not present

## 2022-04-14 DIAGNOSIS — D689 Coagulation defect, unspecified: Secondary | ICD-10-CM | POA: Diagnosis not present

## 2022-04-14 DIAGNOSIS — D631 Anemia in chronic kidney disease: Secondary | ICD-10-CM | POA: Diagnosis not present

## 2022-04-14 DIAGNOSIS — N186 End stage renal disease: Secondary | ICD-10-CM | POA: Diagnosis not present

## 2022-04-14 DIAGNOSIS — Z23 Encounter for immunization: Secondary | ICD-10-CM | POA: Diagnosis not present

## 2022-04-14 DIAGNOSIS — N2581 Secondary hyperparathyroidism of renal origin: Secondary | ICD-10-CM | POA: Diagnosis not present

## 2022-04-14 DIAGNOSIS — T8249XA Other complication of vascular dialysis catheter, initial encounter: Secondary | ICD-10-CM | POA: Diagnosis not present

## 2022-04-17 DIAGNOSIS — D689 Coagulation defect, unspecified: Secondary | ICD-10-CM | POA: Diagnosis not present

## 2022-04-17 DIAGNOSIS — Z992 Dependence on renal dialysis: Secondary | ICD-10-CM | POA: Diagnosis not present

## 2022-04-17 DIAGNOSIS — N186 End stage renal disease: Secondary | ICD-10-CM | POA: Diagnosis not present

## 2022-04-17 DIAGNOSIS — T8249XA Other complication of vascular dialysis catheter, initial encounter: Secondary | ICD-10-CM | POA: Diagnosis not present

## 2022-04-17 DIAGNOSIS — D631 Anemia in chronic kidney disease: Secondary | ICD-10-CM | POA: Diagnosis not present

## 2022-04-17 DIAGNOSIS — N2581 Secondary hyperparathyroidism of renal origin: Secondary | ICD-10-CM | POA: Diagnosis not present

## 2022-04-17 DIAGNOSIS — Z23 Encounter for immunization: Secondary | ICD-10-CM | POA: Diagnosis not present

## 2022-04-18 DIAGNOSIS — M79641 Pain in right hand: Secondary | ICD-10-CM | POA: Diagnosis not present

## 2022-04-18 DIAGNOSIS — I10 Essential (primary) hypertension: Secondary | ICD-10-CM | POA: Diagnosis not present

## 2022-04-19 DIAGNOSIS — Z992 Dependence on renal dialysis: Secondary | ICD-10-CM | POA: Diagnosis not present

## 2022-04-19 DIAGNOSIS — Z23 Encounter for immunization: Secondary | ICD-10-CM | POA: Diagnosis not present

## 2022-04-19 DIAGNOSIS — N2581 Secondary hyperparathyroidism of renal origin: Secondary | ICD-10-CM | POA: Diagnosis not present

## 2022-04-19 DIAGNOSIS — D631 Anemia in chronic kidney disease: Secondary | ICD-10-CM | POA: Diagnosis not present

## 2022-04-19 DIAGNOSIS — N186 End stage renal disease: Secondary | ICD-10-CM | POA: Diagnosis not present

## 2022-04-19 DIAGNOSIS — T8249XA Other complication of vascular dialysis catheter, initial encounter: Secondary | ICD-10-CM | POA: Diagnosis not present

## 2022-04-19 DIAGNOSIS — D689 Coagulation defect, unspecified: Secondary | ICD-10-CM | POA: Diagnosis not present

## 2022-04-20 ENCOUNTER — Ambulatory Visit (HOSPITAL_COMMUNITY)
Admission: RE | Admit: 2022-04-20 | Discharge: 2022-04-20 | Disposition: A | Discharge status: Home / Self Care | Payer: Medicare Other | Source: Ambulatory Visit | Attending: Vascular Surgery | Admitting: Vascular Surgery

## 2022-04-20 ENCOUNTER — Ambulatory Visit (INDEPENDENT_AMBULATORY_CARE_PROVIDER_SITE_OTHER)
Admission: RE | Admit: 2022-04-20 | Discharge: 2022-04-20 | Disposition: A | Discharge status: Home / Self Care | Payer: Medicare Other | Source: Ambulatory Visit | Attending: Vascular Surgery | Admitting: Vascular Surgery

## 2022-04-20 DIAGNOSIS — N186 End stage renal disease: Secondary | ICD-10-CM | POA: Insufficient documentation

## 2022-04-20 DIAGNOSIS — Z992 Dependence on renal dialysis: Secondary | ICD-10-CM

## 2022-04-21 DIAGNOSIS — Z23 Encounter for immunization: Secondary | ICD-10-CM | POA: Diagnosis not present

## 2022-04-21 DIAGNOSIS — D689 Coagulation defect, unspecified: Secondary | ICD-10-CM | POA: Diagnosis not present

## 2022-04-21 DIAGNOSIS — N186 End stage renal disease: Secondary | ICD-10-CM | POA: Diagnosis not present

## 2022-04-21 DIAGNOSIS — Z992 Dependence on renal dialysis: Secondary | ICD-10-CM | POA: Diagnosis not present

## 2022-04-21 DIAGNOSIS — T8249XA Other complication of vascular dialysis catheter, initial encounter: Secondary | ICD-10-CM | POA: Diagnosis not present

## 2022-04-21 DIAGNOSIS — D631 Anemia in chronic kidney disease: Secondary | ICD-10-CM | POA: Diagnosis not present

## 2022-04-21 DIAGNOSIS — N2581 Secondary hyperparathyroidism of renal origin: Secondary | ICD-10-CM | POA: Diagnosis not present

## 2022-04-24 DIAGNOSIS — Z992 Dependence on renal dialysis: Secondary | ICD-10-CM | POA: Diagnosis not present

## 2022-04-24 DIAGNOSIS — Z23 Encounter for immunization: Secondary | ICD-10-CM | POA: Diagnosis not present

## 2022-04-24 DIAGNOSIS — T8249XA Other complication of vascular dialysis catheter, initial encounter: Secondary | ICD-10-CM | POA: Diagnosis not present

## 2022-04-24 DIAGNOSIS — N186 End stage renal disease: Secondary | ICD-10-CM | POA: Diagnosis not present

## 2022-04-24 DIAGNOSIS — D631 Anemia in chronic kidney disease: Secondary | ICD-10-CM | POA: Diagnosis not present

## 2022-04-24 DIAGNOSIS — D689 Coagulation defect, unspecified: Secondary | ICD-10-CM | POA: Diagnosis not present

## 2022-04-24 DIAGNOSIS — N2581 Secondary hyperparathyroidism of renal origin: Secondary | ICD-10-CM | POA: Diagnosis not present

## 2022-04-25 ENCOUNTER — Ambulatory Visit: Payer: Medicare Other | Admitting: Vascular Surgery

## 2022-04-25 DIAGNOSIS — N186 End stage renal disease: Secondary | ICD-10-CM

## 2022-04-25 DIAGNOSIS — M4724 Other spondylosis with radiculopathy, thoracic region: Secondary | ICD-10-CM | POA: Diagnosis not present

## 2022-04-25 NOTE — Progress Notes (Signed)
Patient called this afternoon regarding long-term HD access History of left-sided brachiocephalic fistula that has failed to mature.  The fistula remains patent, but is not usable.  The fistula is undergoing balloon assisted maturation, branch ligation.  Robert Lucas needs new HD access.  Vein mapping reviewed demonstrating small superficial veins bilaterally. He would benefit from left-sided fistula ligation, left arm AV graft creation.  After discussing the risk and benefits of surgery, Robert Lucas elected to proceed.  I will have my office work on scheduling.

## 2022-04-26 DIAGNOSIS — D631 Anemia in chronic kidney disease: Secondary | ICD-10-CM | POA: Diagnosis not present

## 2022-04-26 DIAGNOSIS — Z23 Encounter for immunization: Secondary | ICD-10-CM | POA: Diagnosis not present

## 2022-04-26 DIAGNOSIS — T8249XA Other complication of vascular dialysis catheter, initial encounter: Secondary | ICD-10-CM | POA: Diagnosis not present

## 2022-04-26 DIAGNOSIS — Z992 Dependence on renal dialysis: Secondary | ICD-10-CM | POA: Diagnosis not present

## 2022-04-26 DIAGNOSIS — D689 Coagulation defect, unspecified: Secondary | ICD-10-CM | POA: Diagnosis not present

## 2022-04-26 DIAGNOSIS — N186 End stage renal disease: Secondary | ICD-10-CM | POA: Diagnosis not present

## 2022-04-26 DIAGNOSIS — N2581 Secondary hyperparathyroidism of renal origin: Secondary | ICD-10-CM | POA: Diagnosis not present

## 2022-04-27 ENCOUNTER — Other Ambulatory Visit: Payer: Self-pay

## 2022-04-27 DIAGNOSIS — R000 Tachycardia, unspecified: Secondary | ICD-10-CM | POA: Diagnosis not present

## 2022-04-27 DIAGNOSIS — N186 End stage renal disease: Secondary | ICD-10-CM

## 2022-04-28 DIAGNOSIS — Z23 Encounter for immunization: Secondary | ICD-10-CM | POA: Diagnosis not present

## 2022-04-28 DIAGNOSIS — N2581 Secondary hyperparathyroidism of renal origin: Secondary | ICD-10-CM | POA: Diagnosis not present

## 2022-04-28 DIAGNOSIS — D689 Coagulation defect, unspecified: Secondary | ICD-10-CM | POA: Diagnosis not present

## 2022-04-28 DIAGNOSIS — T8249XA Other complication of vascular dialysis catheter, initial encounter: Secondary | ICD-10-CM | POA: Diagnosis not present

## 2022-04-28 DIAGNOSIS — D631 Anemia in chronic kidney disease: Secondary | ICD-10-CM | POA: Diagnosis not present

## 2022-04-28 DIAGNOSIS — Z992 Dependence on renal dialysis: Secondary | ICD-10-CM | POA: Diagnosis not present

## 2022-04-28 DIAGNOSIS — N186 End stage renal disease: Secondary | ICD-10-CM | POA: Diagnosis not present

## 2022-05-01 ENCOUNTER — Other Ambulatory Visit: Payer: Self-pay

## 2022-05-01 ENCOUNTER — Encounter (HOSPITAL_COMMUNITY): Payer: Self-pay | Admitting: Vascular Surgery

## 2022-05-01 DIAGNOSIS — Z992 Dependence on renal dialysis: Secondary | ICD-10-CM | POA: Diagnosis not present

## 2022-05-01 DIAGNOSIS — T8249XA Other complication of vascular dialysis catheter, initial encounter: Secondary | ICD-10-CM | POA: Diagnosis not present

## 2022-05-01 DIAGNOSIS — N186 End stage renal disease: Secondary | ICD-10-CM | POA: Diagnosis not present

## 2022-05-01 DIAGNOSIS — D631 Anemia in chronic kidney disease: Secondary | ICD-10-CM | POA: Diagnosis not present

## 2022-05-01 DIAGNOSIS — N2581 Secondary hyperparathyroidism of renal origin: Secondary | ICD-10-CM | POA: Diagnosis not present

## 2022-05-01 DIAGNOSIS — D689 Coagulation defect, unspecified: Secondary | ICD-10-CM | POA: Diagnosis not present

## 2022-05-01 DIAGNOSIS — Z23 Encounter for immunization: Secondary | ICD-10-CM | POA: Diagnosis not present

## 2022-05-01 NOTE — Anesthesia Preprocedure Evaluation (Signed)
Anesthesia Evaluation  Patient identified by MRN, date of birth, ID band Patient awake    Reviewed: Allergy & Precautions, NPO status , Patient's Chart, lab work & pertinent test results  Airway Mallampati: III  TM Distance: >3 FB Neck ROM: Full    Dental no notable dental hx.    Pulmonary former smoker   Pulmonary exam normal        Cardiovascular hypertension, Pt. on home beta blockers + CAD, + Past MI and + Peripheral Vascular Disease  Normal cardiovascular exam  Echocardiogram 01/24/2022:  Left ventricle cavity is normal in size. Mild concentric hypertrophy of  the left ventricle. Mild global and severe apical hypokinesis. LVEF  45-50%. LVEF 45-50%. Doppler evidence of grade I (impaired) diastolic  dysfunction, normal LAP.  Left atrial cavity is moderately dilated.  Trileaflet aortic valve with mild calcification. Trace aortic valve  stenosis. Vmax 1.9 m/sec, mean PG 8 mmHg, AVA 1.7 cm by continuity  equation. No regurgitation.  Mild calcification of the mitral valve annulus. Trace mitral  regurgitation. Trace tricuspid regurgitation.  Estimated pulmonary artery systolic pressure 25 Previous study on  11/15/2020, reported grade II diastolic dysfunction, estimated PASP 41  mmHg.     Neuro/Psych    GI/Hepatic negative GI ROS, Neg liver ROS,,,  Endo/Other  diabetes, Insulin Dependent    Renal/GU ESRF and DialysisRenal diseaseOn HD M, W, F     Musculoskeletal  (+) Arthritis ,    Abdominal   Peds  Hematology  (+) Blood dyscrasia, anemia   Anesthesia Other Findings   Reproductive/Obstetrics                             Anesthesia Physical Anesthesia Plan  ASA: 4  Anesthesia Plan: General   Post-op Pain Management:    Induction: Intravenous  PONV Risk Score and Plan: 2 and Ondansetron, Dexamethasone and Treatment may vary due to age or medical condition  Airway Management  Planned: LMA  Additional Equipment:   Intra-op Plan:   Post-operative Plan: Extubation in OR  Informed Consent: I have reviewed the patients History and Physical, chart, labs and discussed the procedure including the risks, benefits and alternatives for the proposed anesthesia with the patient or authorized representative who has indicated his/her understanding and acceptance.     Dental advisory given  Plan Discussed with: CRNA  Anesthesia Plan Comments: (PAT note written 05/01/2022 by Myra Gianotti, PA-C. )        Anesthesia Quick Evaluation

## 2022-05-01 NOTE — Progress Notes (Signed)
Anesthesia Chart Review: SAME DAY WORK-UP  Case: [id number] Date/Time: 05/02/22 0916   Procedure: INSERTION OF LEFT ARTERIOVENOUS (AV) GORE-TEX GRAFT (Left) - PERIPHERAL NERVE BLOCK   Anesthesia type: Monitor Anesthesia Care   Pre-op diagnosis: ESRD   Location: [facility] OR ROOM 12 / [place] OR   Surgeons: Broadus John, MD       DISCUSSION: Patient is a 81 year old male scheduled for the above procedure. He has a left sided brachiocephalic AVF on 02/18/55 which failed to mature and needs new permanent HD access.   History include former smoker (quit 07/03/88), post-operative N/V, HTN, hypercholesterolemia, CAD (MI 1990, s/p TPA and PTCA LAD), HFrEF, DM2, ESRD (HD initiated 10/15/21), back surgery, osteoarthritis (right THA 05/06/19)  Last cardiology follow-up with Dr. Virgina Jock in on 03/16/22. History on abnormal stress test in 08/2020. Given lack of symptoms and CKD, no additional ischemic work-up done at that time. He ultimately progressed to ESRD and started hemodialysis in April 2023. As of 03/16/22, continued to be asymptomatic from CAD. Continue ASA, statin, metoprolol. Dr. Virgina Jock, "In absence of angina or dyspnea, ischemic workup is not necessary. Last echo in 12/2021 showed EF 45-50% (improved from 30-35% 08/2021), mild global and severe apical hypokinesis. Volume status okay at that time. 2 week monitor ordered for frequent PACs. 03/2022 results showed predominantly SR, but > 4000 episodes of AT, 8.6% isolated SVEs, 5.7% isolated VEs, no afib/flutter, high grade AV block, or sinus pause > 3 seconds. He is on Toprol 25 mg daily.   He is a same day work-up. Anesthesia team to evaluate on the day of surgery.    VS:  BP Readings from Last 3 Encounters:  04/11/22 (!) 185/96  04/07/22 139/79  03/16/22 (!) 118/50   Pulse Readings from Last 3 Encounters:  04/11/22 67  04/07/22 70  03/16/22 75     PROVIDERS: Orpah Melter, MD is PCP  Vernell Leep, MD is Cardiologist    LABS: On arrival as  indicated. Last results in [facility] include: Lab Results  Component Value Date   WBC 6.0 10/19/2021   HGB 11.6 (L) 11/22/2021   HCT 34.0 (L) 11/22/2021   PLT 95 (L) 10/19/2021   GLUCOSE 48 (L) 11/22/2021   ALT 19 10/19/2021   AST 24 10/19/2021   NA 136 11/22/2021   K 3.6 11/22/2021   CL 98 11/22/2021   CREATININE 3.70 (H) 11/22/2021   BUN 24 (H) 11/22/2021   CO2 26 10/19/2021   TSH 2.268 10/13/2021   HGBA1C 7.4 (H) 10/15/2021    IMAGES: 1V PCXR 10/16/21: IMPRESSION: 1. Cardiomegaly with pulmonary vascular congestion. 2. Patchy airspace disease in the lungs bilaterally with consolidation in the left upper lobe, concerning for multifocal pneumonia.   EKG: EKG 03/16/2022: Sinus rhythm 84 bpm Frequent PAC, PVC Old anterior infarct Nonspecific T-abnormality   CV: Mobile cardiac telemetry 13 days 03/16/2022 - 03/30/2022: Dominant rhythm: Sinus. HR 51-113 bpm. Avg HR 75 bpm, in sinus rhythm. >4000 episodes of probable atrial tachycardia, fastest at 200 bpm, avg 128 bpm, longest for 46 secs. 8.6% isolated SVE, 4.0% couplets, 2.0% triplets. 815 episodes of VT, fastest at 226 bpm for 5 beats, longest for 18 beats at 174 bpm. 5.7% isolated VE, <1% couplet/triplets. No atrial fibrillation/atrial flutter/high grade AV block, sinus pause >3sec noted. 0 patient triggered events.   Echocardiogram 01/24/2022:  Left ventricle cavity is normal in size. Mild concentric hypertrophy of  the left ventricle.  Mild global and severe apical hypokinesis. LVEF  45-50%.  LVEF 45-50%. Doppler evidence of grade I (impaired) diastolic  dysfunction, normal LAP.  Left atrial cavity is moderately dilated.  Trileaflet aortic valve with mild calcification. Trace aortic valve  stenosis. Vmax 1.9 m/sec, mean PG 8 mmHg, AVA 1.7 cm by continuity  equation. No regurgitation.  Mild calcification of the mitral valve annulus. Trace mitral  regurgitation. Trace tricuspid regurgitation.  Estimated pulmonary artery  systolic pressure 25 Previous study on  11/15/2020, reported grade II diastolic dysfunction, estimated PASP 41  mmHg.   Lexiscan Tetrofosmin Stress Test  09/06/2020: Nondiagnostic ECG stress. Raw images reveal diaphragmatic attenuation in the inferior wall.  Scar in this region cannot be excluded.  There is a medium-sized defect in the inferoseptal region suggestive of mild reversible ischemia. There is a moderate sized severe defect in the anterior and antero-apical region suggestive of scar with minimal peri-infarct ischemia at the apex. The left ventricle is dilated both in rest and stress images. Moderately enlarged left ventricle.  LV rest volume 214 ml.  LV stress volume 192 ml. No stress lung uptake. TID is normal at 0.9. The right ventricle is normal. There is global hypokinesis. Stress LV EF is moderately dysfunctional 31%.  No previous exam available for comparison. High risk in view of low LVEF. Study suggests ischemic cardiomyopathy. - Reviewed by Dr. Earnie Larsson on 09/09/20, "Discussed test results with the patient, which is likely to present ischemic cardiomyopathy.  Patient has no angina symptoms at this time.  His exertional dyspnea has also improved.  I explained to the patient that logical neck step would be to perform coronary angiogram to evaluate for obstructive CAD, followed by appropriate intervention PCI or CABG.  However, in absence of symptoms and presence of tenuous renal function, patient requests to pursue conservative management.  I would recommend repeat echocardiogram in May 2022, followed by office visit."   According prior cardiology notes by Dr. Tollie Eth: - Cardiac cath 08/14/88: "normal Left main, 99% stenosis proximal LAD, no significant disease CFX, RCA, PTCA of LAD done."  Past Medical History:  Diagnosis Date   Arthritis    CHF (congestive heart failure) ([place])    HFrEF   CKD (chronic kidney disease)    sees [place] in [place]   Coronary artery disease     Diabetes ([place])    type 2   sees endo at [place] in [place]   Hypercholesteremia    Hypertension    Myocardial infarction [facility]) 1990   Neuromuscular disorder ([place])    Pneumonia    PONV (postoperative nausea and vomiting)     Past Surgical History:  Procedure Laterality Date   AV FISTULA PLACEMENT Left 05/17/2021   Procedure: LEFT ARM ARTERIOVENOUS (AV) FISTULA CREATION;  Surgeon: Broadus John, MD;  Location: [place] OR;  Service: Vascular;  Laterality: Left;  PERIPHERAL NERVE BLOCK   BACK SURGERY  yrs ago   lower   CARDIAC CATHETERIZATION     CATARACT EXTRACTION Right 01/2021   CHOLECYSTECTOMY N/A 05/02/2017   Procedure: LAPAROSCOPIC CHOLECYSTECTOMY;  Surgeon: Ralene Ok, MD;  Location: [place];  Service: General;  Laterality: N/A;   CORONARY ANGIOPLASTY     2 1990   EYE SURGERY Left 03/2021   cataract removal   IR FLUORO GUIDE CV LINE RIGHT  10/15/2021   IR US GUIDE VASC ACCESS RIGHT  10/15/2021   KNEE SURGERY Left yrs ago   arthroscopic   LIGATION OF COMPETING BRANCHES OF ARTERIOVENOUS FISTULA Left 11/22/2021   Procedure: LEFT ARM FISTULA BRANCH LIGATION;  Surgeon: Broadus John, MD;  Location: [place];  Service: Vascular;  Laterality: Left;   PERIPHERAL VASCULAR BALLOON ANGIOPLASTY  10/19/2021   Procedure: PERIPHERAL VASCULAR BALLOON ANGIOPLASTY;  Surgeon: Broadus John, MD;  Location: [place] CV LAB;  Service: Cardiovascular;;  Left AVF   ROTATOR CUFF REPAIR Left yrs ago   SHOULDER OPEN ROTATOR CUFF REPAIR Right 04/23/2013   Procedure: RIGHT SHOULDER ROTATOR CUFF REPAIR WITH GRAFT AND ANCHORS ;  Surgeon: Tobi Bastos, MD;  Location: [facility];  Service: Orthopedics;  Laterality: Right;   TONSILLECTOMY     TOTAL HIP ARTHROPLASTY Right 05/06/2019   Procedure: TOTAL HIP ARTHROPLASTY ANTERIOR APPROACH;  Surgeon: Paralee Cancel, MD;  Location: [facility];  Service: Orthopedics;  Laterality: Right;  70 mins    MEDICATIONS: No current facility-administered medications for  this encounter.    acetaminophen (TYLENOL) 650 MG CR tablet   allopurinol (ZYLOPRIM) 100 MG tablet   aspirin EC 81 MG tablet   atorvastatin (LIPITOR) 40 MG tablet   CALCITRIOL PO   finasteride (PROSCAR) 5 MG tablet   insulin aspart protamine - aspart (NOVOLOG MIX 70/30 FLEXPEN) (70-30) 100 UNIT/ML FlexPen   IRON SUCROSE IV   metoprolol succinate (TOPROL-XL) 50 MG 24 hr tablet   tamsulosin (FLOMAX) 0.4 MG CAPS capsule   traMADol (ULTRAM) 50 MG tablet    Myra Gianotti, PA-C Surgical Short Stay/Anesthesiology [facility] Phone [phone] [facility] Phone [phone] 05/01/2022 2:27 PM

## 2022-05-01 NOTE — Progress Notes (Signed)
PCP - Dr. Orpah Melter Cardiologist - Dr. Vernell Leep  Chest x-ray - 10/16/21 EKG - 03/16/22 ECHO - 01/24/22 Cardiac Cath - 1990's  Fasting Blood Sugar - 140 Checks Blood Sugar 1/day  Aspirin Instructions: Continue  ERAS Protcol - NPO  Anesthesia review: Y  Patient verbally denies any shortness of breath, fever, cough and chest pain during phone call   -------------  SDW INSTRUCTIONS given:  Your procedure is scheduled on 05/02/22.  Report to [facility] Main Entrance "A" at 0700 A.M., and check in at the Admitting office.  Call this number if you have problems the morning of surgery:  [phone]   Remember:  Do not eat or drink after midnight the night before your surgery     Take these medicines the morning of surgery with A SIP OF WATER  acetaminophen (TYLENOL)  allopurinol (ZYLOPRIM)  traMADol (ULTRAM)   (NOVOLOG MIX 70/30 FLEXPEN): 05/01/22: 180-200 take 9.8 units >201 take 14units                05/02/22:  None   .** PLEASE check your blood sugar the morning of your surgery when you wake up and every 2 hours until you get to the Short Stay unit.  If your blood sugar is less than 70 mg/dL, you will need to treat for low blood sugar: Do not take insulin. Treat a low blood sugar (less than 70 mg/dL) with  cup of clear juice (cranberry or apple), 4 glucose tablets, OR glucose gel. Recheck blood sugar in 15 minutes after treatment (to make sure it is greater than 70 mg/dL). If your blood sugar is not greater than 70 mg/dL on recheck, call [phone] for further instructions.   As of today, STOP taking any Aspirin (unless otherwise instructed by your surgeon) Aleve, Naproxen, Ibuprofen, Motrin, Advil, Goody's, BC's, all herbal medications, fish oil, and all vitamins.                      Do not wear jewelry, make up, or nail polish            Do not wear lotions, powders, perfumes/colognes, or deodorant.            Do not shave 48 hours prior to  surgery.  Men may shave face and neck.            Do not bring valuables to the hospital.            [facility] is not responsible for any belongings or valuables.  Do NOT Smoke (Tobacco/Vaping) 24 hours prior to your procedure If you use a CPAP at night, you may bring all equipment for your overnight stay.   Contacts, glasses, dentures or bridgework may not be worn into surgery.      For patients admitted to the hospital, discharge time will be determined by your treatment team.   Patients discharged the day of surgery will not be allowed to drive home, and someone needs to stay with them for 24 hours.    Special instructions:   [facility]- Preparing For Surgery  Before surgery, you can play an important role. Because skin is not sterile, your skin needs to be as free of germs as possible. You can reduce the number of germs on your skin by washing with CHG (chlorahexidine gluconate) Soap before surgery.  CHG is an antiseptic cleaner which kills germs and bonds with the skin to continue killing germs even after washing.  Oral Hygiene is also important to reduce your risk of infection.  Remember - BRUSH YOUR TEETH THE MORNING OF SURGERY WITH YOUR REGULAR TOOTHPASTE  Please do not use if you have an allergy to CHG or antibacterial soaps. If your skin becomes reddened/irritated stop using the CHG.  Do not shave (including legs and underarms) for at least 48 hours prior to first CHG shower. It is OK to shave your face.  Please follow these instructions carefully.   Shower the NIGHT BEFORE SURGERY and the MORNING OF SURGERY with DIAL Soap.   Pat yourself dry with a CLEAN TOWEL.  Wear CLEAN PAJAMAS to bed the night before surgery  Place CLEAN SHEETS on your bed the night of your first shower and DO NOT SLEEP WITH PETS.   Day of Surgery: Please shower morning of surgery  Wear Clean/Comfortable clothing the morning of surgery Do not apply any deodorants/lotions.   Remember to  brush your teeth WITH YOUR REGULAR TOOTHPASTE.   Questions were answered. Patient verbalized understanding of instructions.

## 2022-05-02 ENCOUNTER — Ambulatory Visit (HOSPITAL_COMMUNITY): Payer: Medicare Other | Admitting: Physician Assistant

## 2022-05-02 ENCOUNTER — Encounter (HOSPITAL_COMMUNITY)
Admission: RE | Disposition: A | Discharge status: Home / Self Care | Payer: Self-pay | Source: Ambulatory Visit | Attending: Vascular Surgery

## 2022-05-02 ENCOUNTER — Other Ambulatory Visit: Payer: Self-pay

## 2022-05-02 ENCOUNTER — Encounter (HOSPITAL_COMMUNITY): Payer: Self-pay | Admitting: Vascular Surgery

## 2022-05-02 ENCOUNTER — Ambulatory Visit (HOSPITAL_COMMUNITY)
Admission: RE | Admit: 2022-05-02 | Discharge: 2022-05-02 | Disposition: A | Discharge status: Home / Self Care | Payer: Medicare Other | Source: Ambulatory Visit | Attending: Vascular Surgery | Admitting: Vascular Surgery

## 2022-05-02 ENCOUNTER — Ambulatory Visit (HOSPITAL_BASED_OUTPATIENT_CLINIC_OR_DEPARTMENT_OTHER): Payer: Medicare Other | Admitting: Physician Assistant

## 2022-05-02 DIAGNOSIS — I2510 Atherosclerotic heart disease of native coronary artery without angina pectoris: Secondary | ICD-10-CM

## 2022-05-02 DIAGNOSIS — Z992 Dependence on renal dialysis: Secondary | ICD-10-CM | POA: Diagnosis not present

## 2022-05-02 DIAGNOSIS — N186 End stage renal disease: Secondary | ICD-10-CM

## 2022-05-02 DIAGNOSIS — Z87891 Personal history of nicotine dependence: Secondary | ICD-10-CM | POA: Insufficient documentation

## 2022-05-02 DIAGNOSIS — I132 Hypertensive heart and chronic kidney disease with heart failure and with stage 5 chronic kidney disease, or end stage renal disease: Secondary | ICD-10-CM | POA: Insufficient documentation

## 2022-05-02 DIAGNOSIS — I120 Hypertensive chronic kidney disease with stage 5 chronic kidney disease or end stage renal disease: Secondary | ICD-10-CM | POA: Diagnosis not present

## 2022-05-02 DIAGNOSIS — M1990 Unspecified osteoarthritis, unspecified site: Secondary | ICD-10-CM | POA: Insufficient documentation

## 2022-05-02 DIAGNOSIS — Z794 Long term (current) use of insulin: Secondary | ICD-10-CM

## 2022-05-02 DIAGNOSIS — E7800 Pure hypercholesterolemia, unspecified: Secondary | ICD-10-CM | POA: Diagnosis not present

## 2022-05-02 DIAGNOSIS — N185 Chronic kidney disease, stage 5: Secondary | ICD-10-CM | POA: Diagnosis not present

## 2022-05-02 DIAGNOSIS — I252 Old myocardial infarction: Secondary | ICD-10-CM | POA: Diagnosis not present

## 2022-05-02 DIAGNOSIS — E1151 Type 2 diabetes mellitus with diabetic peripheral angiopathy without gangrene: Secondary | ICD-10-CM | POA: Diagnosis not present

## 2022-05-02 DIAGNOSIS — D631 Anemia in chronic kidney disease: Secondary | ICD-10-CM | POA: Diagnosis not present

## 2022-05-02 DIAGNOSIS — E1122 Type 2 diabetes mellitus with diabetic chronic kidney disease: Secondary | ICD-10-CM | POA: Diagnosis not present

## 2022-05-02 DIAGNOSIS — E1129 Type 2 diabetes mellitus with other diabetic kidney complication: Secondary | ICD-10-CM | POA: Diagnosis not present

## 2022-05-02 HISTORY — PX: AV FISTULA PLACEMENT: SHX1204

## 2022-05-02 LAB — POCT I-STAT, CHEM 8
BUN: 23 mg/dL (ref 8–23)
Calcium, Ion: 1.14 mmol/L — ABNORMAL LOW (ref 1.15–1.40)
Chloride: 99 mmol/L (ref 98–111)
Creatinine, Ser: 3.9 mg/dL — ABNORMAL HIGH (ref 0.61–1.24)
Glucose, Bld: 81 mg/dL (ref 70–99)
HCT: 31 % — ABNORMAL LOW (ref 39.0–52.0)
Hemoglobin: 10.5 g/dL — ABNORMAL LOW (ref 13.0–17.0)
Potassium: 3.7 mmol/L (ref 3.5–5.1)
Sodium: 137 mmol/L (ref 135–145)
TCO2: 27 mmol/L (ref 22–32)

## 2022-05-02 LAB — GLUCOSE, CAPILLARY
Glucose-Capillary: 94 mg/dL (ref 70–99)
Glucose-Capillary: 128 mg/dL — ABNORMAL HIGH (ref 70–99)

## 2022-05-02 SURGERY — INSERTION OF ARTERIOVENOUS (AV) GORE-TEX GRAFT ARM
Anesthesia: General | Site: Arm Upper | Laterality: Left

## 2022-05-02 MED ORDER — TRAMADOL HCL 50 MG PO TABS: 50.0000 mg | ORAL_TABLET | Freq: Four times a day (QID) | ORAL | 0 refills | Status: DC | PRN

## 2022-05-02 MED ORDER — SODIUM CHLORIDE 0.9 % IV SOLN: INTRAVENOUS | Status: DC

## 2022-05-02 MED ORDER — INSULIN ASPART 100 UNIT/ML IJ SOLN: 0.0000 [IU] | INTRAMUSCULAR | Status: DC | PRN

## 2022-05-02 MED ORDER — VANCOMYCIN HCL IN DEXTROSE 1-5 GM/200ML-% IV SOLN
1000.0000 mg | INTRAVENOUS | Status: AC
  Administered 2022-05-02: 1000 mg via INTRAVENOUS
  Filled 2022-05-02: qty 200

## 2022-05-02 MED ORDER — FENTANYL CITRATE (PF) 100 MCG/2ML IJ SOLN: 25.0000 ug | INTRAMUSCULAR | Status: DC | PRN

## 2022-05-02 MED ORDER — STERILE WATER FOR IRRIGATION IR SOLN
Status: DC | PRN
  Administered 2022-05-02: 1000 mL

## 2022-05-02 MED ORDER — ORAL CARE MOUTH RINSE: 15.0000 mL | Freq: Once | OROMUCOSAL | Status: AC

## 2022-05-02 MED ORDER — EPHEDRINE 5 MG/ML INJ
INTRAVENOUS | Status: AC
  Filled 2022-05-02: qty 5

## 2022-05-02 MED ORDER — PHENYLEPHRINE HCL-NACL 20-0.9 MG/250ML-% IV SOLN
INTRAVENOUS | Status: DC | PRN
  Administered 2022-05-02: 30 ug/min via INTRAVENOUS

## 2022-05-02 MED ORDER — LIDOCAINE-EPINEPHRINE (PF) 1 %-1:200000 IJ SOLN
INTRAMUSCULAR | Status: AC
  Filled 2022-05-02: qty 30

## 2022-05-02 MED ORDER — CHLORHEXIDINE GLUCONATE 4 % EX LIQD: 60.0000 mL | Freq: Once | CUTANEOUS | Status: DC

## 2022-05-02 MED ORDER — FENTANYL CITRATE (PF) 250 MCG/5ML IJ SOLN
INTRAMUSCULAR | Status: AC
  Filled 2022-05-02: qty 5

## 2022-05-02 MED ORDER — ONDANSETRON HCL 4 MG/2ML IJ SOLN: 4.0000 mg | Freq: Once | INTRAMUSCULAR | Status: DC | PRN

## 2022-05-02 MED ORDER — FENTANYL CITRATE (PF) 100 MCG/2ML IJ SOLN
INTRAMUSCULAR | Status: AC
  Filled 2022-05-02: qty 2

## 2022-05-02 MED ORDER — EPHEDRINE SULFATE-NACL 50-0.9 MG/10ML-% IV SOSY
PREFILLED_SYRINGE | INTRAVENOUS | Status: DC | PRN
  Administered 2022-05-02 (×3): 5 mg via INTRAVENOUS

## 2022-05-02 MED ORDER — PROPOFOL 10 MG/ML IV BOLUS
INTRAVENOUS | Status: DC | PRN
  Administered 2022-05-02: 150 mg via INTRAVENOUS

## 2022-05-02 MED ORDER — HEPARIN 6000 UNIT IRRIGATION SOLUTION
Status: AC
  Filled 2022-05-02: qty 500

## 2022-05-02 MED ORDER — HEPARIN 6000 UNIT IRRIGATION SOLUTION
Status: DC | PRN
  Administered 2022-05-02: 1

## 2022-05-02 MED ORDER — CHLORHEXIDINE GLUCONATE 0.12 % MT SOLN: 15.0000 mL | Freq: Once | OROMUCOSAL | Status: AC

## 2022-05-02 MED ORDER — 0.9 % SODIUM CHLORIDE (POUR BTL) OPTIME
TOPICAL | Status: DC | PRN
  Administered 2022-05-02: 1000 mL

## 2022-05-02 MED ORDER — ONDANSETRON HCL 4 MG/2ML IJ SOLN
INTRAMUSCULAR | Status: DC | PRN
  Administered 2022-05-02: 4 mg via INTRAVENOUS

## 2022-05-02 MED ORDER — HEPARIN SODIUM (PORCINE) 1000 UNIT/ML IJ SOLN
INTRAMUSCULAR | Status: DC | PRN
  Administered 2022-05-02: 3000 [IU] via INTRAVENOUS

## 2022-05-02 MED ORDER — HEPARIN SODIUM (PORCINE) 1000 UNIT/ML IJ SOLN
INTRAMUSCULAR | Status: AC
  Filled 2022-05-02: qty 10

## 2022-05-02 MED ORDER — LIDOCAINE 2% (20 MG/ML) 5 ML SYRINGE
INTRAMUSCULAR | Status: DC | PRN
  Administered 2022-05-02: 60 mg via INTRAVENOUS

## 2022-05-02 MED ORDER — ONDANSETRON HCL 4 MG/2ML IJ SOLN
INTRAMUSCULAR | Status: AC
  Filled 2022-05-02: qty 2

## 2022-05-02 MED ORDER — MIDAZOLAM HCL 2 MG/2ML IJ SOLN
INTRAMUSCULAR | Status: AC
  Filled 2022-05-02: qty 2

## 2022-05-02 MED ORDER — ACETAMINOPHEN 10 MG/ML IV SOLN: 1000.0000 mg | Freq: Once | INTRAVENOUS | Status: DC | PRN

## 2022-05-02 MED ORDER — CHLORHEXIDINE GLUCONATE 0.12 % MT SOLN
OROMUCOSAL | Status: AC
  Administered 2022-05-02: 15 mL via OROMUCOSAL
  Filled 2022-05-02: qty 15

## 2022-05-02 MED ORDER — HEMOSTATIC AGENTS (NO CHARGE) OPTIME
TOPICAL | Status: DC | PRN
  Administered 2022-05-02: 1 via TOPICAL

## 2022-05-02 MED ORDER — LIDOCAINE 2% (20 MG/ML) 5 ML SYRINGE
INTRAMUSCULAR | Status: AC
  Filled 2022-05-02: qty 5

## 2022-05-02 MED ORDER — FENTANYL CITRATE (PF) 250 MCG/5ML IJ SOLN
INTRAMUSCULAR | Status: DC | PRN
  Administered 2022-05-02 (×3): 25 ug via INTRAVENOUS

## 2022-05-02 SURGICAL SUPPLY — 39 items
ADH SKN CLS APL DERMABOND .7 (GAUZE/BANDAGES/DRESSINGS) ×1
ARMBAND PINK RESTRICT EXTREMIT (MISCELLANEOUS) ×2 IMPLANT
BAG COUNTER SPONGE SURGICOUNT (BAG) ×2 IMPLANT
BAG SPNG CNTER NS LX DISP (BAG) ×1
BLADE CLIPPER SURG (BLADE) ×2 IMPLANT
BNDG ELASTIC 4X5.8 VLCR STR LF (GAUZE/BANDAGES/DRESSINGS) ×2 IMPLANT
CANISTER SUCT 3000ML PPV (MISCELLANEOUS) ×2 IMPLANT
CLIP LIGATING EXTRA MED SLVR (CLIP) ×2 IMPLANT
CLIP LIGATING EXTRA SM BLUE (MISCELLANEOUS) ×2 IMPLANT
CLIP VESOCCLUDE MED 6/CT (CLIP) ×2 IMPLANT
COVER PROBE W GEL 5X96 (DRAPES) ×2 IMPLANT
DERMABOND ADVANCED .7 DNX12 (GAUZE/BANDAGES/DRESSINGS) ×2 IMPLANT
DRAPE INCISE IOBAN 66X45 STRL (DRAPES) IMPLANT
ELECT REM PT RETURN 9FT ADLT (ELECTROSURGICAL) ×1
ELECTRODE REM PT RTRN 9FT ADLT (ELECTROSURGICAL) ×2 IMPLANT
GLOVE BIO SURGEON STRL SZ7.5 (GLOVE) ×2 IMPLANT
GLOVE BIOGEL PI IND STRL 8 (GLOVE) ×2 IMPLANT
GLOVE SRG 8 PF TXTR STRL LF DI (GLOVE) ×2 IMPLANT
GLOVE SURG POLYISO LF SZ8 (GLOVE) IMPLANT
GLOVE SURG UNDER POLY LF SZ8 (GLOVE) ×1
GOWN STRL REUS W/ TWL LRG LVL3 (GOWN DISPOSABLE) ×4 IMPLANT
GOWN STRL REUS W/TWL 2XL LVL3 (GOWN DISPOSABLE) ×4 IMPLANT
GOWN STRL REUS W/TWL LRG LVL3 (GOWN DISPOSABLE) ×2
GRAFT GORETEX STRT 4-7X45 (Vascular Products) IMPLANT
HEMOSTAT SNOW SURGICEL 2X4 (HEMOSTASIS) IMPLANT
KIT BASIN OR (CUSTOM PROCEDURE TRAY) ×2 IMPLANT
KIT TURNOVER KIT B (KITS) ×2 IMPLANT
NS IRRIG 1000ML POUR BTL (IV SOLUTION) ×2 IMPLANT
PACK CV ACCESS (CUSTOM PROCEDURE TRAY) ×2 IMPLANT
PAD ARMBOARD 7.5X6 YLW CONV (MISCELLANEOUS) ×4 IMPLANT
SLING ARM FOAM STRAP LRG (SOFTGOODS) IMPLANT
SUT MNCRL AB 4-0 PS2 18 (SUTURE) ×2 IMPLANT
SUT PROLENE 6 0 BV (SUTURE) ×2 IMPLANT
SUT SILK 2 0 SH (SUTURE) IMPLANT
SUT VIC AB 3-0 SH 27 (SUTURE) ×2
SUT VIC AB 3-0 SH 27X BRD (SUTURE) ×4 IMPLANT
TOWEL GREEN STERILE (TOWEL DISPOSABLE) ×2 IMPLANT
UNDERPAD 30X36 HEAVY ABSORB (UNDERPADS AND DIAPERS) ×2 IMPLANT
WATER STERILE IRR 1000ML POUR (IV SOLUTION) ×2 IMPLANT

## 2022-05-02 NOTE — Op Note (Signed)
    NAME: Robert Lucas    MRN: [id number] DOB: January 06, 1941    DATE OF OPERATION: 05/02/2022  PREOP DIAGNOSIS:    ESRD  POSTOP DIAGNOSIS:    Same  PROCEDURE:    Left arm brachiocephalic fistula ligation, Brachial artery to basilic vein AV graft creation  SURGEON: Broadus John  ASSIST: Dagoberto Ligas  ANESTHESIA: General   EBL: 38ml  INDICATIONS:    AUDI WETTSTEIN is a 81 y.o. male  who presents s/p Left brachiocephalic fistula creation with subsequent balloon assisted maturation, branch ligation.  Unfortunately, the fistula has failed to mature.   There appears to be an area of sclerotic vein that is resistant to venoplasty.   This point, Deven would benefit from new permanent HD access.  Imaging was reviewed demonstrating small superficial veins bilaterally.  After discussing risk and benefits of left-sided AV fistula ligation AV graft creation for HD access, [place] elected to proceed.  FINDINGS:   Basilic vein small in the humeral segment but 25mm in the axilla Brachial artery 5.53mm  TECHNIQUE:   After informed consent was obtained, the patient was brought to the operating room, placed supine on the operating room table.  General anesthesia was used.  The patient was prepped and draped in normal sterile fashion.  Surgical time-out was taken. Pre-op antibiotics were given.  Procedure began with using ultrasound and sent the brachial artery and axillary vein in the left arm.  Both proved of sufficient size to continue with AV graft.  A longitudinal incision was made at the distal aspect of the humerus. The brachial artery was identified, gently dissected, and encircled with silastic loops. A second incision was then made over the axillary vein and similarly dissected and encircled. The tunneling device was then passed between these incisions. A 4 X 7 taper PTFE graft was passed through the tunnel, taking care to avoid kinking or twists. The graft was irrigated with  heparinized saline solution.   Proximal and distal arterial control was then obtained and a longitudinal arteriotomy was made on the brachial artery. The artery was irrigated with heparinized saline solution. An end to side artery anastomosis was then performed from the 42mm graft to the brachial vein in a running fashion using running 6-0 prolene suture. When the clamps were removed from the artery, the graft demonstrated excellent inflow. It was flushed with heparinized saline prior to being clamped. There was an excellent multiphasic signal at the wrist.  Attention was then directed to the axillary vein anastomosis. Proximal and distal control were obtained and a longitudinal venotomy was made. The vein was then irrigated with heparinized saline solution. The end of the PTFE graft was then cut in a beveled fashion at the appropriate length. An end to side vein anastomosis was then performed, using a running, 6-0 prolene suture. Prior to establishing flow, all vessels were back bled and flushed to ensure there was no clot.    There was a strong distal signal in the radial artery and a thrill in the vein centrally. Hemostasis was found to be satisfactory, and all wounds were irrigated with saline solution and closed in layers with vicryl and Monocryl at the skin. A dry sterile dressing was applied. Anesthetic care was terminated. The patient was transported to the recovery room in stable condition.   Macie Burows, MD [facility] DATE OF DICTATION:   05/02/2022

## 2022-05-02 NOTE — Discharge Instructions (Signed)
° °  [facility] [place] ° °Discharge Instructions ° °AV Fistula or Graft Surgery for Dialysis Access ° °Please refer to the following instructions for your post-procedure care. Your surgeon or physician assistant will discuss any changes with you. ° °Activity ° °You may drive the day following your surgery, if you are comfortable and no longer taking prescription pain medication. Resume full activity as the soreness in your incision resolves. ° °Bathing/Showering ° °You may shower after you go home. Keep your incision dry for 48 hours. Do not soak in a bathtub, hot tub, or swim until the incision heals completely. You may not shower if you have a hemodialysis catheter. ° °Incision Care ° °Clean your incision with mild soap and water after 48 hours. Pat the area dry with a clean towel. You do not need a bandage unless otherwise instructed. Do not apply any ointments or creams to your incision. You may have skin glue on your incision. Do not peel it off. It will come off on its own in about one week. Your arm may swell a bit after surgery. To reduce swelling use pillows to elevate your arm so it is above your heart. Your doctor will tell you if you need to lightly wrap your arm with an ACE bandage. ° °Diet ° °Resume your normal diet. There are not special food restrictions following this procedure. In order to heal from your surgery, it is CRITICAL to get adequate nutrition. Your body requires vitamins, minerals, and protein. Vegetables are the best source of vitamins and minerals. Vegetables also provide the perfect balance of protein. Processed food has little nutritional value, so try to avoid this. ° °Medications ° °Resume taking all of your medications. If your incision is causing pain, you may take over-the counter pain relievers such as acetaminophen (Tylenol). If you were prescribed a stronger pain medication, please be aware these medications can cause nausea and constipation. Prevent  nausea by taking the medication with a snack or meal. Avoid constipation by drinking plenty of fluids and eating foods with high amount of fiber, such as fruits, vegetables, and grains. Do not take Tylenol if you are taking prescription pain medications. ° ° ° ° °Follow up °Your surgeon may want to see you in the office following your access surgery. If so, this will be arranged at the time of your surgery. ° °Please call us immediately for any of the following conditions: ° °Increased pain, redness, drainage (pus) from your incision site °Fever of 101 degrees or higher °Severe or worsening pain at your incision site °Hand pain or numbness. ° °Reduce your risk of vascular disease: ° °Stop smoking. If you would like help, call [facility] at 1-800-QUIT-NOW (1-800-784-8669) or [place] at 336-586-4000 ° °Manage your cholesterol °Maintain a desired weight °Control your diabetes °Keep your blood pressure down ° °Dialysis ° °It will take several weeks to several months for your new dialysis access to be ready for use. Your surgeon will determine when it is OK to use it. Your nephrologist will continue to direct your dialysis. You can continue to use your Permcath until your new access is ready for use. ° °If you have any questions, please call the office at [phone]. ° °

## 2022-05-02 NOTE — Transfer of Care (Signed)
Immediate Anesthesia Transfer of Care Note  Patient: Robert Lucas  Procedure(s) Performed: INSERTION OF LEFT ARTERIOVENOUS (AV) GORE-TEX GRAFT (Left: Arm Upper)  Patient Location: PACU  Anesthesia Type:General  Level of Consciousness: awake and drowsy  Airway & Oxygen Therapy: Patient Spontanous Breathing  Post-op Assessment: Report given to RN and Post -op Vital signs reviewed and stable  Post vital signs: Reviewed and stable  Last Vitals:  Vitals Value Taken Time  BP 133/59 05/02/22 1107  Temp    Pulse 76 05/02/22 1109  Resp 20 05/02/22 1109  SpO2 93 % 05/02/22 1109  Vitals shown include unvalidated device data.  Last Pain:  Vitals:   05/02/22 0822  TempSrc:   PainSc: 0-No pain         Complications: No notable events documented.

## 2022-05-02 NOTE — Anesthesia Procedure Notes (Signed)
Procedure Name: LMA Insertion Date/Time: 05/02/2022 9:30 AM  Performed by: Dorann Lodge, CRNAPre-anesthesia Checklist: Patient identified, Emergency Drugs available, Suction available and Patient being monitored Patient Re-evaluated:Patient Re-evaluated prior to induction Oxygen Delivery Method: Circle System Utilized Preoxygenation: Pre-oxygenation with 100% oxygen Induction Type: IV induction Ventilation: Mask ventilation without difficulty LMA: LMA inserted LMA Size: 4.0 Number of attempts: 1 Airway Equipment and Method: Bite block Placement Confirmation: positive ETCO2 Tube secured with: Tape Dental Injury: Teeth and Oropharynx as per pre-operative assessment

## 2022-05-02 NOTE — Anesthesia Postprocedure Evaluation (Signed)
Anesthesia Post Note  Patient: Robert Lucas  Procedure(s) Performed: INSERTION OF LEFT ARTERIOVENOUS (AV) GORE-TEX GRAFT (Left: Arm Upper)     Patient location during evaluation: PACU Anesthesia Type: General Level of consciousness: awake Pain management: pain level controlled Vital Signs Assessment: post-procedure vital signs reviewed and stable Respiratory status: spontaneous breathing, nonlabored ventilation, respiratory function stable and patient connected to nasal cannula oxygen Cardiovascular status: blood pressure returned to baseline and stable Postop Assessment: no apparent nausea or vomiting Anesthetic complications: no   No notable events documented.  Last Vitals:  Vitals:   05/02/22 1115 05/02/22 1130  BP: (!) 132/57 (!) 136/53  Pulse: 77 73  Resp: 17 18  Temp:  36.4 C  SpO2: 96% 93%    Last Pain:  Vitals:   05/02/22 1130  TempSrc:   PainSc: 0-No pain                 Hero Kulish P Jenille Laszlo

## 2022-05-02 NOTE — H&P (Signed)
     Access Visit   History of Present Illness   Robert Lucas is a 81 y.o. year old male who presents for preop for AV graft placement.  Prior postoperative follow-up for: Left brachiocephalic fistula creation, with subsequent balloon assisted maturation and branch ligation.  Unfortunately, Ruben's fistula has failed to dilate.  Today, [name] was doing well, accompanied by his wife.  No complaints.  He currently dialyzes via right IJ TDC on MWF at [facility]   Physical Examination   Vitals:   05/01/22 1516 05/02/22 0755  BP:  (!) 190/65  Pulse:  66  Resp:  18  Temp:  97.9 F (36.6 C)  TempSrc:  Oral  SpO2:  98%  Weight: 70.3 kg 70.3 kg  Height: 5\' 3"  (1.6 m) 5\' 3"  (1.6 m)    Body mass index is 27.46 kg/m.  left arm Incision is well healed, 2+ radial pulse, hand grip is 5/5, sensation in digits is intact, palpable thrill     Medical Decision Making   Robert Lucas is a 81 y.o. year old male who presents s/p Left brachiocephalic fistula creation with subsequent balloon assisted maturation, branch ligation.  Unfortunately, the fistula has failed to mature.   There appears to be an area of sclerotic vein that is resistant to venoplasty.  This point, Ankur would benefit from new permanent HD access.  Imaging was reviewed demonstrating small superficial veins bilaterally.  After discussing risk and benefits of left-sided AV fistula ligation AV graft creation for HD access, Chevy elected to proceed.  Broadus John MD [facility] [place] Office: [phone]

## 2022-05-03 ENCOUNTER — Encounter (HOSPITAL_COMMUNITY): Payer: Self-pay | Admitting: Vascular Surgery

## 2022-05-03 DIAGNOSIS — T8249XA Other complication of vascular dialysis catheter, initial encounter: Secondary | ICD-10-CM | POA: Diagnosis not present

## 2022-05-03 DIAGNOSIS — N186 End stage renal disease: Secondary | ICD-10-CM | POA: Diagnosis not present

## 2022-05-03 DIAGNOSIS — R52 Pain, unspecified: Secondary | ICD-10-CM | POA: Diagnosis not present

## 2022-05-03 DIAGNOSIS — R519 Headache, unspecified: Secondary | ICD-10-CM | POA: Diagnosis not present

## 2022-05-03 DIAGNOSIS — R509 Fever, unspecified: Secondary | ICD-10-CM | POA: Diagnosis not present

## 2022-05-03 DIAGNOSIS — D689 Coagulation defect, unspecified: Secondary | ICD-10-CM | POA: Diagnosis not present

## 2022-05-03 DIAGNOSIS — N2581 Secondary hyperparathyroidism of renal origin: Secondary | ICD-10-CM | POA: Diagnosis not present

## 2022-05-03 DIAGNOSIS — Z992 Dependence on renal dialysis: Secondary | ICD-10-CM | POA: Diagnosis not present

## 2022-05-03 DIAGNOSIS — D631 Anemia in chronic kidney disease: Secondary | ICD-10-CM | POA: Diagnosis not present

## 2022-05-04 NOTE — Progress Notes (Signed)
Called patient to inform him about his monitor results. Patient understood.

## 2022-05-05 DIAGNOSIS — Z992 Dependence on renal dialysis: Secondary | ICD-10-CM | POA: Diagnosis not present

## 2022-05-05 DIAGNOSIS — N2581 Secondary hyperparathyroidism of renal origin: Secondary | ICD-10-CM | POA: Diagnosis not present

## 2022-05-05 DIAGNOSIS — D631 Anemia in chronic kidney disease: Secondary | ICD-10-CM | POA: Diagnosis not present

## 2022-05-05 DIAGNOSIS — D689 Coagulation defect, unspecified: Secondary | ICD-10-CM | POA: Diagnosis not present

## 2022-05-05 DIAGNOSIS — R509 Fever, unspecified: Secondary | ICD-10-CM | POA: Diagnosis not present

## 2022-05-05 DIAGNOSIS — R52 Pain, unspecified: Secondary | ICD-10-CM | POA: Diagnosis not present

## 2022-05-05 DIAGNOSIS — R519 Headache, unspecified: Secondary | ICD-10-CM | POA: Diagnosis not present

## 2022-05-05 DIAGNOSIS — N186 End stage renal disease: Secondary | ICD-10-CM | POA: Diagnosis not present

## 2022-05-05 DIAGNOSIS — T8249XA Other complication of vascular dialysis catheter, initial encounter: Secondary | ICD-10-CM | POA: Diagnosis not present

## 2022-05-08 DIAGNOSIS — R52 Pain, unspecified: Secondary | ICD-10-CM | POA: Diagnosis not present

## 2022-05-08 DIAGNOSIS — T8249XA Other complication of vascular dialysis catheter, initial encounter: Secondary | ICD-10-CM | POA: Diagnosis not present

## 2022-05-08 DIAGNOSIS — D689 Coagulation defect, unspecified: Secondary | ICD-10-CM | POA: Diagnosis not present

## 2022-05-08 DIAGNOSIS — R519 Headache, unspecified: Secondary | ICD-10-CM | POA: Diagnosis not present

## 2022-05-08 DIAGNOSIS — N2581 Secondary hyperparathyroidism of renal origin: Secondary | ICD-10-CM | POA: Diagnosis not present

## 2022-05-08 DIAGNOSIS — Z992 Dependence on renal dialysis: Secondary | ICD-10-CM | POA: Diagnosis not present

## 2022-05-08 DIAGNOSIS — D631 Anemia in chronic kidney disease: Secondary | ICD-10-CM | POA: Diagnosis not present

## 2022-05-08 DIAGNOSIS — N186 End stage renal disease: Secondary | ICD-10-CM | POA: Diagnosis not present

## 2022-05-08 DIAGNOSIS — R509 Fever, unspecified: Secondary | ICD-10-CM | POA: Diagnosis not present

## 2022-05-10 DIAGNOSIS — Z992 Dependence on renal dialysis: Secondary | ICD-10-CM | POA: Diagnosis not present

## 2022-05-10 DIAGNOSIS — D631 Anemia in chronic kidney disease: Secondary | ICD-10-CM | POA: Diagnosis not present

## 2022-05-10 DIAGNOSIS — N2581 Secondary hyperparathyroidism of renal origin: Secondary | ICD-10-CM | POA: Diagnosis not present

## 2022-05-10 DIAGNOSIS — R519 Headache, unspecified: Secondary | ICD-10-CM | POA: Diagnosis not present

## 2022-05-10 DIAGNOSIS — R52 Pain, unspecified: Secondary | ICD-10-CM | POA: Diagnosis not present

## 2022-05-10 DIAGNOSIS — N186 End stage renal disease: Secondary | ICD-10-CM | POA: Diagnosis not present

## 2022-05-10 DIAGNOSIS — D689 Coagulation defect, unspecified: Secondary | ICD-10-CM | POA: Diagnosis not present

## 2022-05-10 DIAGNOSIS — R509 Fever, unspecified: Secondary | ICD-10-CM | POA: Diagnosis not present

## 2022-05-10 DIAGNOSIS — T8249XA Other complication of vascular dialysis catheter, initial encounter: Secondary | ICD-10-CM | POA: Diagnosis not present

## 2022-05-12 DIAGNOSIS — D631 Anemia in chronic kidney disease: Secondary | ICD-10-CM | POA: Diagnosis not present

## 2022-05-12 DIAGNOSIS — N2581 Secondary hyperparathyroidism of renal origin: Secondary | ICD-10-CM | POA: Diagnosis not present

## 2022-05-12 DIAGNOSIS — R509 Fever, unspecified: Secondary | ICD-10-CM | POA: Diagnosis not present

## 2022-05-12 DIAGNOSIS — D689 Coagulation defect, unspecified: Secondary | ICD-10-CM | POA: Diagnosis not present

## 2022-05-12 DIAGNOSIS — Z992 Dependence on renal dialysis: Secondary | ICD-10-CM | POA: Diagnosis not present

## 2022-05-12 DIAGNOSIS — T8249XA Other complication of vascular dialysis catheter, initial encounter: Secondary | ICD-10-CM | POA: Diagnosis not present

## 2022-05-12 DIAGNOSIS — R52 Pain, unspecified: Secondary | ICD-10-CM | POA: Diagnosis not present

## 2022-05-12 DIAGNOSIS — R519 Headache, unspecified: Secondary | ICD-10-CM | POA: Diagnosis not present

## 2022-05-12 DIAGNOSIS — N186 End stage renal disease: Secondary | ICD-10-CM | POA: Diagnosis not present

## 2022-05-15 ENCOUNTER — Other Ambulatory Visit: Payer: Self-pay

## 2022-05-15 DIAGNOSIS — R519 Headache, unspecified: Secondary | ICD-10-CM | POA: Diagnosis not present

## 2022-05-15 DIAGNOSIS — N186 End stage renal disease: Secondary | ICD-10-CM

## 2022-05-15 DIAGNOSIS — R52 Pain, unspecified: Secondary | ICD-10-CM | POA: Diagnosis not present

## 2022-05-15 DIAGNOSIS — N2581 Secondary hyperparathyroidism of renal origin: Secondary | ICD-10-CM | POA: Diagnosis not present

## 2022-05-15 DIAGNOSIS — T8249XA Other complication of vascular dialysis catheter, initial encounter: Secondary | ICD-10-CM | POA: Diagnosis not present

## 2022-05-15 DIAGNOSIS — Z992 Dependence on renal dialysis: Secondary | ICD-10-CM | POA: Diagnosis not present

## 2022-05-15 DIAGNOSIS — R509 Fever, unspecified: Secondary | ICD-10-CM | POA: Diagnosis not present

## 2022-05-15 DIAGNOSIS — D631 Anemia in chronic kidney disease: Secondary | ICD-10-CM | POA: Diagnosis not present

## 2022-05-15 DIAGNOSIS — D689 Coagulation defect, unspecified: Secondary | ICD-10-CM | POA: Diagnosis not present

## 2022-05-15 NOTE — Progress Notes (Unsigned)
    Postoperative Access Visit   History of Present Illness   Robert Lucas is a 81 y.o. year old male who presents for postoperative follow-up for: Left arm brachiocephalic fistula ligation, Brachial artery to basilic vein AV graft creation by Dr. Virl Cagey on 05/02/22. The patient's wounds are *** healed.  The patient notes numbness, burning and elbow pain.  Physical Examination  There were no vitals filed for this visit. There is no height or weight on file to calculate BMI.  {side of body:30421359} arm Incision is *** healed, *** radial pulse, hand grip is ***/5, sensation in digits is *** intact, ***palpable thrill, bruit can *** be auscultated     Medical Decision Making   Robert Lucas is a 81 y.o. year old male who presents s/p {side of body:30421359} {AccessOptions:19197::"thigh arteriovenous graft","upper arm arteriovenous graft","forearm arteriovenous graft","first stage brachial vein transposition","second stage brachial vein transposition","single stage brachial vein transposition","first stage basilic vein transposition","second stage basilic vein transposition","single stage basilic vein transposition","radiocephalic arteriovenous fistula","brachiocephalic arteriovenous fistula"}  Patent *** without signs or symptoms of steal syndrome The patient's access will be ready for use *** ***The patient's tunneled dialysis catheter can be removed when Nephrology is comfortable with the performance of the *** The patient may follow up on a prn basis   Karoline Caldwell, PA-C [facility] [place]: [facility] MD: Roxanne Mins

## 2022-05-16 ENCOUNTER — Ambulatory Visit (HOSPITAL_COMMUNITY)
Admission: RE | Admit: 2022-05-16 | Discharge: 2022-05-16 | Disposition: A | Discharge status: Home / Self Care | Payer: Medicare Other | Source: Ambulatory Visit | Attending: Vascular Surgery | Admitting: Vascular Surgery

## 2022-05-16 ENCOUNTER — Ambulatory Visit (INDEPENDENT_AMBULATORY_CARE_PROVIDER_SITE_OTHER): Payer: Medicare Other | Admitting: Physician Assistant

## 2022-05-16 VITALS — BP 151/74 | HR 76 | Temp 98.0°F | Resp 20 | Ht 63.0 in | Wt 149.6 lb

## 2022-05-16 DIAGNOSIS — Z992 Dependence on renal dialysis: Secondary | ICD-10-CM

## 2022-05-16 DIAGNOSIS — N186 End stage renal disease: Secondary | ICD-10-CM | POA: Insufficient documentation

## 2022-05-16 DIAGNOSIS — T82898A Other specified complication of vascular prosthetic devices, implants and grafts, initial encounter: Secondary | ICD-10-CM

## 2022-05-16 MED ORDER — GABAPENTIN 100 MG PO CAPS: 100.0000 mg | ORAL_CAPSULE | Freq: Every day | ORAL | 1 refills | Status: DC

## 2022-05-17 DIAGNOSIS — D689 Coagulation defect, unspecified: Secondary | ICD-10-CM | POA: Diagnosis not present

## 2022-05-17 DIAGNOSIS — N186 End stage renal disease: Secondary | ICD-10-CM | POA: Diagnosis not present

## 2022-05-17 DIAGNOSIS — R519 Headache, unspecified: Secondary | ICD-10-CM | POA: Diagnosis not present

## 2022-05-17 DIAGNOSIS — R509 Fever, unspecified: Secondary | ICD-10-CM | POA: Diagnosis not present

## 2022-05-17 DIAGNOSIS — R52 Pain, unspecified: Secondary | ICD-10-CM | POA: Diagnosis not present

## 2022-05-17 DIAGNOSIS — Z992 Dependence on renal dialysis: Secondary | ICD-10-CM | POA: Diagnosis not present

## 2022-05-17 DIAGNOSIS — D631 Anemia in chronic kidney disease: Secondary | ICD-10-CM | POA: Diagnosis not present

## 2022-05-17 DIAGNOSIS — N2581 Secondary hyperparathyroidism of renal origin: Secondary | ICD-10-CM | POA: Diagnosis not present

## 2022-05-17 DIAGNOSIS — T8249XA Other complication of vascular dialysis catheter, initial encounter: Secondary | ICD-10-CM | POA: Diagnosis not present

## 2022-05-19 DIAGNOSIS — Z992 Dependence on renal dialysis: Secondary | ICD-10-CM | POA: Diagnosis not present

## 2022-05-19 DIAGNOSIS — N2581 Secondary hyperparathyroidism of renal origin: Secondary | ICD-10-CM | POA: Diagnosis not present

## 2022-05-19 DIAGNOSIS — D689 Coagulation defect, unspecified: Secondary | ICD-10-CM | POA: Diagnosis not present

## 2022-05-19 DIAGNOSIS — N186 End stage renal disease: Secondary | ICD-10-CM | POA: Diagnosis not present

## 2022-05-19 DIAGNOSIS — R519 Headache, unspecified: Secondary | ICD-10-CM | POA: Diagnosis not present

## 2022-05-19 DIAGNOSIS — R52 Pain, unspecified: Secondary | ICD-10-CM | POA: Diagnosis not present

## 2022-05-19 DIAGNOSIS — R509 Fever, unspecified: Secondary | ICD-10-CM | POA: Diagnosis not present

## 2022-05-19 DIAGNOSIS — T8249XA Other complication of vascular dialysis catheter, initial encounter: Secondary | ICD-10-CM | POA: Diagnosis not present

## 2022-05-19 DIAGNOSIS — D631 Anemia in chronic kidney disease: Secondary | ICD-10-CM | POA: Diagnosis not present

## 2022-05-21 DIAGNOSIS — D689 Coagulation defect, unspecified: Secondary | ICD-10-CM | POA: Diagnosis not present

## 2022-05-21 DIAGNOSIS — T8249XA Other complication of vascular dialysis catheter, initial encounter: Secondary | ICD-10-CM | POA: Diagnosis not present

## 2022-05-21 DIAGNOSIS — N186 End stage renal disease: Secondary | ICD-10-CM | POA: Diagnosis not present

## 2022-05-21 DIAGNOSIS — D631 Anemia in chronic kidney disease: Secondary | ICD-10-CM | POA: Diagnosis not present

## 2022-05-21 DIAGNOSIS — R52 Pain, unspecified: Secondary | ICD-10-CM | POA: Diagnosis not present

## 2022-05-21 DIAGNOSIS — Z992 Dependence on renal dialysis: Secondary | ICD-10-CM | POA: Diagnosis not present

## 2022-05-21 DIAGNOSIS — R519 Headache, unspecified: Secondary | ICD-10-CM | POA: Diagnosis not present

## 2022-05-21 DIAGNOSIS — R509 Fever, unspecified: Secondary | ICD-10-CM | POA: Diagnosis not present

## 2022-05-21 DIAGNOSIS — N2581 Secondary hyperparathyroidism of renal origin: Secondary | ICD-10-CM | POA: Diagnosis not present

## 2022-05-23 ENCOUNTER — Ambulatory Visit (INDEPENDENT_AMBULATORY_CARE_PROVIDER_SITE_OTHER): Payer: Medicare Other | Admitting: Physician Assistant

## 2022-05-23 VITALS — BP 154/75 | HR 77 | Temp 97.6°F | Resp 20 | Ht 63.0 in | Wt 153.8 lb

## 2022-05-23 DIAGNOSIS — R519 Headache, unspecified: Secondary | ICD-10-CM | POA: Diagnosis not present

## 2022-05-23 DIAGNOSIS — D689 Coagulation defect, unspecified: Secondary | ICD-10-CM | POA: Diagnosis not present

## 2022-05-23 DIAGNOSIS — Z992 Dependence on renal dialysis: Secondary | ICD-10-CM | POA: Diagnosis not present

## 2022-05-23 DIAGNOSIS — N186 End stage renal disease: Secondary | ICD-10-CM | POA: Diagnosis not present

## 2022-05-23 DIAGNOSIS — R52 Pain, unspecified: Secondary | ICD-10-CM | POA: Diagnosis not present

## 2022-05-23 DIAGNOSIS — T8249XA Other complication of vascular dialysis catheter, initial encounter: Secondary | ICD-10-CM | POA: Diagnosis not present

## 2022-05-23 DIAGNOSIS — R509 Fever, unspecified: Secondary | ICD-10-CM | POA: Diagnosis not present

## 2022-05-23 DIAGNOSIS — D631 Anemia in chronic kidney disease: Secondary | ICD-10-CM | POA: Diagnosis not present

## 2022-05-23 DIAGNOSIS — N2581 Secondary hyperparathyroidism of renal origin: Secondary | ICD-10-CM | POA: Diagnosis not present

## 2022-05-23 DIAGNOSIS — T82898A Other specified complication of vascular prosthetic devices, implants and grafts, initial encounter: Secondary | ICD-10-CM

## 2022-05-23 NOTE — Progress Notes (Signed)
POST OPERATIVE OFFICE NOTE    CC:  F/u for surgery  HPI:  This is a 81 y.o. male who is s/p  Left arm brachiocephalic fistula ligation, Brachial artery to basilic vein AV graft creation by Dr. Virl Cagey on 05/02/22.  He was seen in our office on 05/16/22.  Post op  he developed  numbness, burning and weakness in his left hand. He reports intermittent sharp shooting pains down his left upper and and across his knuckles in his left hand. This at times is keeping him awake at night. He also reports having trouble carrying or gripping objects.     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  Pt returns today for follow up.  Pt states the gabapentin may be helping some and wants to see if a stronger dose will help.  He has soreness/pain around the left AV graft tunnel site with mild weakness in the grip.  He is able to sleep at nigh once he gets comfortable.     Allergies  Allergen Reactions   Zestril [Lisinopril] Cough   Hydrocodone Nausea Only   Januvia [Sitagliptin] Other (See Comments)    Unknown   Levitra [Vardenafil] Other (See Comments)    flushing   Prednisone Other (See Comments)    Elevates BGL; patient prefers to not take this   Victoza [Liraglutide] Other (See Comments)    GI upset   Penicillins Rash and Other (See Comments)    Rash around ankles Has patient had a PCN reaction causing immediate rash, facial/tongue/throat swelling, SOB or lightheadedness with hypotension: Yes Has patient had a PCN reaction causing severe rash involving mucus membranes or skin necrosis: Unk Has patient had a PCN reaction that required hospitalization: No Has patient had a PCN reaction occurring within the last 10 years: No If all of the above answers are "NO", then may proceed with Cephalosporin use.     Current Outpatient Medications  Medication Sig Dispense Refill   acetaminophen (TYLENOL) 650 MG CR tablet  Take 1,300 mg by mouth in the morning, at noon, and at bedtime.     allopurinol (ZYLOPRIM) 100 MG tablet Take 100 mg by mouth in the morning.     aspirin EC 81 MG tablet Take 81 mg by mouth daily with supper.     atorvastatin (LIPITOR) 40 MG tablet Take 40 mg by mouth at bedtime.     CALCITRIOL PO Take 2 capsules by mouth every Monday, Wednesday, and Friday with hemodialysis.     finasteride (PROSCAR) 5 MG tablet Take 5 mg by mouth daily with supper.     gabapentin (NEURONTIN) 100 MG capsule Take 1 capsule (100 mg total) by mouth daily. 30 capsule 1   insulin aspart protamine - aspart (NOVOLOG MIX 70/30 FLEXPEN) (70-30) 100 UNIT/ML FlexPen Inject 14-20 Units into the skin daily as needed (high blood sugar). If BS is <150=0 units, If BS is 150-200 units=14 units, If BS is >201=20 units     IRON SUCROSE IV Inject into the vein as needed. At dialysis     metoprolol succinate (TOPROL-XL) 50 MG 24 hr tablet Take 0.5 tablets (25 mg total) by mouth daily. Take with or immediately following a meal. (Patient taking differently: Take 12.5 mg by mouth at bedtime. Take with or immediately following a meal.) 30 tablet 0   tamsulosin (FLOMAX) 0.4 MG CAPS capsule Take 1 capsule (0.4 mg total) by mouth daily after supper. (Patient  taking differently: Take 0.4 mg by mouth at bedtime.) 30 capsule 0   traMADol (ULTRAM) 50 MG tablet Take 1 tablet (50 mg total) by mouth every 6 (six) hours as needed. 20 tablet 0   No current facility-administered medications for this visit.     ROS:  See HPI  Physical Exam:    Incision:  well healed Extremities:  very minimal edema in the forearm, and graft tunnel area.  Palpable radial pulse as well as thrill in graft.  No ischemic changes.  Neuro: sensation grossly intact left hand compared to right.   Lungs non labored breathing    Assessment/Plan:  This is a 81 y.o. male who is s/p:This is a 81 y.o. male who is s/p  Left arm brachiocephalic fistula ligation, Brachial  artery to basilic vein AV graft creation by Dr. Virl Cagey on 05/02/22.  He was seen in our office on 05/16/22.  Post op  he developed  numbness, burning and weakness in his left hand. He reports intermittent sharp shooting pains down his left upper and and across his knuckles in his left hand.  The symptoms seem to be improving and are tolerable.  He will get the gabapentin refilled and increase the QHS dose to 300 mg.  I gave him a squeeze ball to exercise his grip with.  He has a palpable radial pulse without ischemic changes to the left hand.  His biggest complaint is pain surrounding the tunnel site of the graft.  The weakness could be mild steal.  These symptoms seem to be improving.  -The graft was placed 05/02/22 by Dr. Virl Cagey. The graft may be accessed 06/12/22 for HD.  Once it is successful the [facility] may discontinued. F/U PRN  If his symptoms of steal become intolerable, or he losses function, develops pallor or has increased pain he will call.    Roxy Horseman PA-C [facility] [phone]   Clinic MD:  Carlis Abbott

## 2022-05-26 DIAGNOSIS — R509 Fever, unspecified: Secondary | ICD-10-CM | POA: Diagnosis not present

## 2022-05-26 DIAGNOSIS — N2581 Secondary hyperparathyroidism of renal origin: Secondary | ICD-10-CM | POA: Diagnosis not present

## 2022-05-26 DIAGNOSIS — T8249XA Other complication of vascular dialysis catheter, initial encounter: Secondary | ICD-10-CM | POA: Diagnosis not present

## 2022-05-26 DIAGNOSIS — R52 Pain, unspecified: Secondary | ICD-10-CM | POA: Diagnosis not present

## 2022-05-26 DIAGNOSIS — D631 Anemia in chronic kidney disease: Secondary | ICD-10-CM | POA: Diagnosis not present

## 2022-05-26 DIAGNOSIS — N186 End stage renal disease: Secondary | ICD-10-CM | POA: Diagnosis not present

## 2022-05-26 DIAGNOSIS — Z992 Dependence on renal dialysis: Secondary | ICD-10-CM | POA: Diagnosis not present

## 2022-05-26 DIAGNOSIS — D689 Coagulation defect, unspecified: Secondary | ICD-10-CM | POA: Diagnosis not present

## 2022-05-26 DIAGNOSIS — R519 Headache, unspecified: Secondary | ICD-10-CM | POA: Diagnosis not present

## 2022-05-29 DIAGNOSIS — R509 Fever, unspecified: Secondary | ICD-10-CM | POA: Diagnosis not present

## 2022-05-29 DIAGNOSIS — N2581 Secondary hyperparathyroidism of renal origin: Secondary | ICD-10-CM | POA: Diagnosis not present

## 2022-05-29 DIAGNOSIS — R519 Headache, unspecified: Secondary | ICD-10-CM | POA: Diagnosis not present

## 2022-05-29 DIAGNOSIS — R52 Pain, unspecified: Secondary | ICD-10-CM | POA: Diagnosis not present

## 2022-05-29 DIAGNOSIS — D631 Anemia in chronic kidney disease: Secondary | ICD-10-CM | POA: Diagnosis not present

## 2022-05-29 DIAGNOSIS — T8249XA Other complication of vascular dialysis catheter, initial encounter: Secondary | ICD-10-CM | POA: Diagnosis not present

## 2022-05-29 DIAGNOSIS — Z992 Dependence on renal dialysis: Secondary | ICD-10-CM | POA: Diagnosis not present

## 2022-05-29 DIAGNOSIS — N186 End stage renal disease: Secondary | ICD-10-CM | POA: Diagnosis not present

## 2022-05-29 DIAGNOSIS — D689 Coagulation defect, unspecified: Secondary | ICD-10-CM | POA: Diagnosis not present

## 2022-05-31 DIAGNOSIS — Z992 Dependence on renal dialysis: Secondary | ICD-10-CM | POA: Diagnosis not present

## 2022-05-31 DIAGNOSIS — D689 Coagulation defect, unspecified: Secondary | ICD-10-CM | POA: Diagnosis not present

## 2022-05-31 DIAGNOSIS — R52 Pain, unspecified: Secondary | ICD-10-CM | POA: Diagnosis not present

## 2022-05-31 DIAGNOSIS — R519 Headache, unspecified: Secondary | ICD-10-CM | POA: Diagnosis not present

## 2022-05-31 DIAGNOSIS — D631 Anemia in chronic kidney disease: Secondary | ICD-10-CM | POA: Diagnosis not present

## 2022-05-31 DIAGNOSIS — T8249XA Other complication of vascular dialysis catheter, initial encounter: Secondary | ICD-10-CM | POA: Diagnosis not present

## 2022-05-31 DIAGNOSIS — N186 End stage renal disease: Secondary | ICD-10-CM | POA: Diagnosis not present

## 2022-05-31 DIAGNOSIS — N2581 Secondary hyperparathyroidism of renal origin: Secondary | ICD-10-CM | POA: Diagnosis not present

## 2022-05-31 DIAGNOSIS — R509 Fever, unspecified: Secondary | ICD-10-CM | POA: Diagnosis not present

## 2022-06-01 DIAGNOSIS — E1129 Type 2 diabetes mellitus with other diabetic kidney complication: Secondary | ICD-10-CM | POA: Diagnosis not present

## 2022-06-01 DIAGNOSIS — N186 End stage renal disease: Secondary | ICD-10-CM | POA: Diagnosis not present

## 2022-06-01 DIAGNOSIS — Z992 Dependence on renal dialysis: Secondary | ICD-10-CM | POA: Diagnosis not present

## 2022-06-02 DIAGNOSIS — D689 Coagulation defect, unspecified: Secondary | ICD-10-CM | POA: Diagnosis not present

## 2022-06-02 DIAGNOSIS — Z23 Encounter for immunization: Secondary | ICD-10-CM | POA: Diagnosis not present

## 2022-06-02 DIAGNOSIS — D509 Iron deficiency anemia, unspecified: Secondary | ICD-10-CM | POA: Diagnosis not present

## 2022-06-02 DIAGNOSIS — N186 End stage renal disease: Secondary | ICD-10-CM | POA: Diagnosis not present

## 2022-06-02 DIAGNOSIS — N2581 Secondary hyperparathyroidism of renal origin: Secondary | ICD-10-CM | POA: Diagnosis not present

## 2022-06-02 DIAGNOSIS — T8249XA Other complication of vascular dialysis catheter, initial encounter: Secondary | ICD-10-CM | POA: Diagnosis not present

## 2022-06-02 DIAGNOSIS — D631 Anemia in chronic kidney disease: Secondary | ICD-10-CM | POA: Diagnosis not present

## 2022-06-02 DIAGNOSIS — Z992 Dependence on renal dialysis: Secondary | ICD-10-CM | POA: Diagnosis not present

## 2022-06-02 DIAGNOSIS — R52 Pain, unspecified: Secondary | ICD-10-CM | POA: Diagnosis not present

## 2022-06-05 DIAGNOSIS — D631 Anemia in chronic kidney disease: Secondary | ICD-10-CM | POA: Diagnosis not present

## 2022-06-05 DIAGNOSIS — D689 Coagulation defect, unspecified: Secondary | ICD-10-CM | POA: Diagnosis not present

## 2022-06-05 DIAGNOSIS — D509 Iron deficiency anemia, unspecified: Secondary | ICD-10-CM | POA: Diagnosis not present

## 2022-06-05 DIAGNOSIS — T8249XA Other complication of vascular dialysis catheter, initial encounter: Secondary | ICD-10-CM | POA: Diagnosis not present

## 2022-06-05 DIAGNOSIS — Z23 Encounter for immunization: Secondary | ICD-10-CM | POA: Diagnosis not present

## 2022-06-05 DIAGNOSIS — R52 Pain, unspecified: Secondary | ICD-10-CM | POA: Diagnosis not present

## 2022-06-05 DIAGNOSIS — N186 End stage renal disease: Secondary | ICD-10-CM | POA: Diagnosis not present

## 2022-06-05 DIAGNOSIS — N2581 Secondary hyperparathyroidism of renal origin: Secondary | ICD-10-CM | POA: Diagnosis not present

## 2022-06-05 DIAGNOSIS — Z992 Dependence on renal dialysis: Secondary | ICD-10-CM | POA: Diagnosis not present

## 2022-06-07 ENCOUNTER — Other Ambulatory Visit: Payer: Self-pay

## 2022-06-07 DIAGNOSIS — R52 Pain, unspecified: Secondary | ICD-10-CM | POA: Diagnosis not present

## 2022-06-07 DIAGNOSIS — T8249XA Other complication of vascular dialysis catheter, initial encounter: Secondary | ICD-10-CM | POA: Diagnosis not present

## 2022-06-07 DIAGNOSIS — Z23 Encounter for immunization: Secondary | ICD-10-CM | POA: Diagnosis not present

## 2022-06-07 DIAGNOSIS — N186 End stage renal disease: Secondary | ICD-10-CM | POA: Diagnosis not present

## 2022-06-07 DIAGNOSIS — D689 Coagulation defect, unspecified: Secondary | ICD-10-CM | POA: Diagnosis not present

## 2022-06-07 DIAGNOSIS — N2581 Secondary hyperparathyroidism of renal origin: Secondary | ICD-10-CM | POA: Diagnosis not present

## 2022-06-07 DIAGNOSIS — D631 Anemia in chronic kidney disease: Secondary | ICD-10-CM | POA: Diagnosis not present

## 2022-06-07 DIAGNOSIS — D509 Iron deficiency anemia, unspecified: Secondary | ICD-10-CM | POA: Diagnosis not present

## 2022-06-07 DIAGNOSIS — Z992 Dependence on renal dialysis: Secondary | ICD-10-CM | POA: Diagnosis not present

## 2022-06-09 ENCOUNTER — Other Ambulatory Visit: Payer: Self-pay

## 2022-06-09 DIAGNOSIS — R52 Pain, unspecified: Secondary | ICD-10-CM | POA: Diagnosis not present

## 2022-06-09 DIAGNOSIS — T8249XA Other complication of vascular dialysis catheter, initial encounter: Secondary | ICD-10-CM | POA: Diagnosis not present

## 2022-06-09 DIAGNOSIS — N2581 Secondary hyperparathyroidism of renal origin: Secondary | ICD-10-CM | POA: Diagnosis not present

## 2022-06-09 DIAGNOSIS — N186 End stage renal disease: Secondary | ICD-10-CM | POA: Diagnosis not present

## 2022-06-09 DIAGNOSIS — D689 Coagulation defect, unspecified: Secondary | ICD-10-CM | POA: Diagnosis not present

## 2022-06-09 DIAGNOSIS — Z992 Dependence on renal dialysis: Secondary | ICD-10-CM | POA: Diagnosis not present

## 2022-06-09 DIAGNOSIS — D509 Iron deficiency anemia, unspecified: Secondary | ICD-10-CM | POA: Diagnosis not present

## 2022-06-09 DIAGNOSIS — D631 Anemia in chronic kidney disease: Secondary | ICD-10-CM | POA: Diagnosis not present

## 2022-06-09 DIAGNOSIS — Z23 Encounter for immunization: Secondary | ICD-10-CM | POA: Diagnosis not present

## 2022-06-09 MED ORDER — GABAPENTIN 300 MG PO CAPS: 300.0000 mg | ORAL_CAPSULE | Freq: Every day | ORAL | 3 refills | Status: DC

## 2022-06-12 DIAGNOSIS — D509 Iron deficiency anemia, unspecified: Secondary | ICD-10-CM | POA: Diagnosis not present

## 2022-06-12 DIAGNOSIS — D631 Anemia in chronic kidney disease: Secondary | ICD-10-CM | POA: Diagnosis not present

## 2022-06-12 DIAGNOSIS — N2581 Secondary hyperparathyroidism of renal origin: Secondary | ICD-10-CM | POA: Diagnosis not present

## 2022-06-12 DIAGNOSIS — D689 Coagulation defect, unspecified: Secondary | ICD-10-CM | POA: Diagnosis not present

## 2022-06-12 DIAGNOSIS — Z992 Dependence on renal dialysis: Secondary | ICD-10-CM | POA: Diagnosis not present

## 2022-06-12 DIAGNOSIS — Z23 Encounter for immunization: Secondary | ICD-10-CM | POA: Diagnosis not present

## 2022-06-12 DIAGNOSIS — R52 Pain, unspecified: Secondary | ICD-10-CM | POA: Diagnosis not present

## 2022-06-12 DIAGNOSIS — N186 End stage renal disease: Secondary | ICD-10-CM | POA: Diagnosis not present

## 2022-06-12 DIAGNOSIS — T8249XA Other complication of vascular dialysis catheter, initial encounter: Secondary | ICD-10-CM | POA: Diagnosis not present

## 2022-06-13 ENCOUNTER — Telehealth: Payer: Self-pay

## 2022-06-13 NOTE — Telephone Encounter (Signed)
[employer] called requesting supervising NPI number of Maur[place], PA for the Gabapentin 300 mg ordered on 12/8.  Reviewed pt's chart, returned call for clarification, two identifiers used. Gave Dr. Virl Cagey' NPI as supervising prescriber.

## 2022-06-14 DIAGNOSIS — D689 Coagulation defect, unspecified: Secondary | ICD-10-CM | POA: Diagnosis not present

## 2022-06-14 DIAGNOSIS — Z23 Encounter for immunization: Secondary | ICD-10-CM | POA: Diagnosis not present

## 2022-06-14 DIAGNOSIS — R52 Pain, unspecified: Secondary | ICD-10-CM | POA: Diagnosis not present

## 2022-06-14 DIAGNOSIS — D631 Anemia in chronic kidney disease: Secondary | ICD-10-CM | POA: Diagnosis not present

## 2022-06-14 DIAGNOSIS — Z992 Dependence on renal dialysis: Secondary | ICD-10-CM | POA: Diagnosis not present

## 2022-06-14 DIAGNOSIS — T8249XA Other complication of vascular dialysis catheter, initial encounter: Secondary | ICD-10-CM | POA: Diagnosis not present

## 2022-06-14 DIAGNOSIS — N186 End stage renal disease: Secondary | ICD-10-CM | POA: Diagnosis not present

## 2022-06-14 DIAGNOSIS — D509 Iron deficiency anemia, unspecified: Secondary | ICD-10-CM | POA: Diagnosis not present

## 2022-06-14 DIAGNOSIS — N2581 Secondary hyperparathyroidism of renal origin: Secondary | ICD-10-CM | POA: Diagnosis not present

## 2022-06-16 DIAGNOSIS — D509 Iron deficiency anemia, unspecified: Secondary | ICD-10-CM | POA: Diagnosis not present

## 2022-06-16 DIAGNOSIS — N2581 Secondary hyperparathyroidism of renal origin: Secondary | ICD-10-CM | POA: Diagnosis not present

## 2022-06-16 DIAGNOSIS — T8249XA Other complication of vascular dialysis catheter, initial encounter: Secondary | ICD-10-CM | POA: Diagnosis not present

## 2022-06-16 DIAGNOSIS — D631 Anemia in chronic kidney disease: Secondary | ICD-10-CM | POA: Diagnosis not present

## 2022-06-16 DIAGNOSIS — Z23 Encounter for immunization: Secondary | ICD-10-CM | POA: Diagnosis not present

## 2022-06-16 DIAGNOSIS — R52 Pain, unspecified: Secondary | ICD-10-CM | POA: Diagnosis not present

## 2022-06-16 DIAGNOSIS — N186 End stage renal disease: Secondary | ICD-10-CM | POA: Diagnosis not present

## 2022-06-16 DIAGNOSIS — D689 Coagulation defect, unspecified: Secondary | ICD-10-CM | POA: Diagnosis not present

## 2022-06-16 DIAGNOSIS — Z992 Dependence on renal dialysis: Secondary | ICD-10-CM | POA: Diagnosis not present

## 2022-06-19 DIAGNOSIS — N2581 Secondary hyperparathyroidism of renal origin: Secondary | ICD-10-CM | POA: Diagnosis not present

## 2022-06-19 DIAGNOSIS — D689 Coagulation defect, unspecified: Secondary | ICD-10-CM | POA: Diagnosis not present

## 2022-06-19 DIAGNOSIS — D509 Iron deficiency anemia, unspecified: Secondary | ICD-10-CM | POA: Diagnosis not present

## 2022-06-19 DIAGNOSIS — R52 Pain, unspecified: Secondary | ICD-10-CM | POA: Diagnosis not present

## 2022-06-19 DIAGNOSIS — Z23 Encounter for immunization: Secondary | ICD-10-CM | POA: Diagnosis not present

## 2022-06-19 DIAGNOSIS — D631 Anemia in chronic kidney disease: Secondary | ICD-10-CM | POA: Diagnosis not present

## 2022-06-19 DIAGNOSIS — Z992 Dependence on renal dialysis: Secondary | ICD-10-CM | POA: Diagnosis not present

## 2022-06-19 DIAGNOSIS — T8249XA Other complication of vascular dialysis catheter, initial encounter: Secondary | ICD-10-CM | POA: Diagnosis not present

## 2022-06-19 DIAGNOSIS — N186 End stage renal disease: Secondary | ICD-10-CM | POA: Diagnosis not present

## 2022-06-20 ENCOUNTER — Ambulatory Visit: Payer: Medicare Other | Admitting: Cardiology

## 2022-06-20 ENCOUNTER — Encounter: Payer: Self-pay | Admitting: Cardiology

## 2022-06-20 VITALS — BP 154/84 | HR 79 | Resp 16 | Ht 63.0 in | Wt 157.0 lb

## 2022-06-20 DIAGNOSIS — I4719 Other supraventricular tachycardia: Secondary | ICD-10-CM | POA: Diagnosis not present

## 2022-06-20 DIAGNOSIS — I5020 Unspecified systolic (congestive) heart failure: Secondary | ICD-10-CM | POA: Diagnosis not present

## 2022-06-20 DIAGNOSIS — I4729 Other ventricular tachycardia: Secondary | ICD-10-CM | POA: Diagnosis not present

## 2022-06-20 MED ORDER — METOPROLOL SUCCINATE ER 50 MG PO TB24: 50.0000 mg | ORAL_TABLET | Freq: Every day | ORAL | 3 refills | Status: DC

## 2022-06-20 NOTE — Progress Notes (Signed)
Patient referred by [name], NP for coronary artery disease  Subjective:   Robert Lucas, male    DOB: 18-Jan-1941, 81 y.o.   MRN: [id number]   Chief Complaint  Patient presents with   HFrEF   Follow-up    3 month   HPI  81 y.o. Caucasian male with with hypertension, hyperlipidemia, IDDM, CAD s/p anterior MI in 33s treated with TPA and PTCA, ESRD on HD, obesity, h/o gout, OSA, HFrEF  Sicne his last visit, he has undergone AV graft placement in left arm as the fistula did not mature. Currently, he continues dialysis through catheter in right chest. He denies chest pain, shortness of breath, palpitations, leg edema, orthopnea, PND, TIA/syncope. Blood pressure elevated toda,y but generally lower than this.   Reviewed recent test results with the patient, details below.    Current Outpatient Medications:    acetaminophen (TYLENOL) 650 MG CR tablet, Take 1,300 mg by mouth in the morning, at noon, and at bedtime., Disp: , Rfl:    allopurinol (ZYLOPRIM) 100 MG tablet, Take 100 mg by mouth in the morning., Disp: , Rfl:    aspirin EC 81 MG tablet, Take 81 mg by mouth daily with supper., Disp: , Rfl:    atorvastatin (LIPITOR) 40 MG tablet, Take 40 mg by mouth at bedtime., Disp: , Rfl:    CALCITRIOL PO, Take 2 capsules by mouth every Monday, Wednesday, and Friday with hemodialysis., Disp: , Rfl:    finasteride (PROSCAR) 5 MG tablet, Take 5 mg by mouth daily with supper., Disp: , Rfl:    gabapentin (NEURONTIN) 300 MG capsule, Take 1 capsule (300 mg total) by mouth at bedtime., Disp: 90 capsule, Rfl: 3   insulin aspart protamine - aspart (NOVOLOG MIX 70/30 FLEXPEN) (70-30) 100 UNIT/ML FlexPen, Inject 14-20 Units into the skin daily as needed (high blood sugar). If BS is <150=0 units, If BS is 150-200 units=14 units, If BS is >201=20 units, Disp: , Rfl:    IRON SUCROSE IV, Inject into the vein as needed. At dialysis, Disp: , Rfl:    Methoxy PEG-Epoetin Beta (MIRCERA IJ), Mircera,  Disp: , Rfl:    metoprolol succinate (TOPROL-XL) 50 MG 24 hr tablet, Take 0.5 tablets (25 mg total) by mouth daily. Take with or immediately following a meal. (Patient taking differently: Take 12.5 mg by mouth at bedtime. Take with or immediately following a meal.), Disp: 30 tablet, Rfl: 0   tamsulosin (FLOMAX) 0.4 MG CAPS capsule, Take 1 capsule (0.4 mg total) by mouth daily after supper. (Patient taking differently: Take 0.4 mg by mouth at bedtime.), Disp: 30 capsule, Rfl: 0   traMADol (ULTRAM) 50 MG tablet, Take 1 tablet (50 mg total) by mouth every 6 (six) hours as needed. (Patient not taking: Reported on 06/20/2022), Disp: 20 tablet, Rfl: 0    Cardiovascular studies:  Mobile cardiac telemetry 13 days 03/16/2022 - 03/30/2022: Dominant rhythm: Sinus. HR 51-113 bpm. Avg HR 75 bpm, in sinus rhythm. >4000 episodes of probable atrial tachycardia, fastest at 200 bpm, avg 128 bpm, longest for 46 secs. 8.6% isolated SVE, 4.0% couplets, 2.0% triplets. 815 episodes of VT, fastest at 226 bpm for 5 beats, longest for 18 beats at 174 bpm. 5.7% isolated VE, <1% couplet/triplets. No atrial fibrillation/atrial flutter/high grade AV block, sinus pause >3sec noted. 0 patient triggered events.    EKG 03/16/2022: Sinus rhythm 84 bpm Frequent PAC, PVC Old anterior infarct Nonspecific T-abnormality  Echocardiogram 01/24/2022:  Left ventricle cavity is normal in  size. Mild concentric hypertrophy of  the left ventricle.  Mild global and severe apical hypokinesis. LVEF  45-50%. LVEF 45-50%. Doppler evidence of grade I (impaired) diastolic  dysfunction, normal LAP.  Left atrial cavity is moderately dilated.  Trileaflet aortic valve with mild calcification. Trace aortic valve  stenosis. Vmax 1.9 m/sec, mean PG 8 mmHg, AVA 1.7 cm by continuity  equation. No regurgitation.  Mild calcification of the mitral valve annulus. Trace mitral  regurgitation. Trace tricuspid regurgitation.  Estimated pulmonary artery  systolic pressure 25 Previous study on  11/15/2020, reported grade II diastolic dysfunction, estimated PASP 41  mmHg.   Echocardiogram 08/26/2021: 1. Left ventricular ejection fraction, by estimation, is 30 to 35%. The  left ventricle has moderately decreased function. The left ventricle has  no regional wall motion abnormalities. The left ventricular internal  cavity size was mildly dilated. Left ventricular diastolic parameters are  consistent with Grade II diastolic dysfunction (pseudonormalization).   2. Right ventricular systolic function is normal. The right ventricular  size is normal. There is severely elevated pulmonary artery systolic  pressure. The estimated right ventricular systolic pressure is 32.6 mmHg.   3. Left atrial size was moderately dilated.   4. The mitral valve is normal in structure. No evidence of mitral valve  regurgitation. No evidence of mitral stenosis.   5. Tricuspid valve regurgitation is mild to moderate.   6. The aortic valve is tricuspid. There is mild calcification of the  aortic valve. There is mild thickening of the aortic valve. Aortic valve  regurgitation is not visualized. Aortic valve sclerosis is present, with  no evidence of aortic valve stenosis.   7. The inferior vena cava is dilated in size with <50% respiratory  variability, suggesting right atrial pressure of 15 mmHg.   Cath 1990: Normal Left main, 99% stenosis proximal LAD, no significant disease CFX RCA, PTCA of LAD done   Recent labs: 09/07/2021: Glucose 308, BUN/Cr 78/4.87. EGFR 11. Na/K 135/4.2. Albumin 3.0. Phos 6.1. Rest of the CMP normal H/H 8/24. MCV 94. Platelets 156 HbA1C 6.3%   Review of Systems  Cardiovascular:  Negative for chest pain, dyspnea on exertion, leg swelling, palpitations and syncope.  Musculoskeletal:  Positive for back pain (Radiating to left leg.  Also has left leg weakness.).        Vitals:   06/20/22 0857 06/20/22 0901  BP: (!) 156/95 (!) 154/84   Pulse: 63 79  Resp: 16   SpO2: 98% 98%     Body mass index is 27.81 kg/m. Filed Weights   06/20/22 0857  Weight: 157 lb (71.2 kg)     Objective:   Physical Exam Vitals and nursing note reviewed.  Constitutional:      Appearance: He is well-developed.  Neck:     Vascular: No JVD.  Cardiovascular:     Rate and Rhythm: Normal rate and regular rhythm.     Pulses: Intact distal pulses.     Heart sounds: Normal heart sounds. No murmur heard. Pulmonary:     Effort: Pulmonary effort is normal.     Breath sounds: Normal breath sounds. No wheezing or rales.  Musculoskeletal:     Right lower leg: No edema.     Left lower leg: No edema.           Assessment & Recommendations:   81 y.o. Caucasian male with with hypertension, hyperlipidemia, IDDM, CAD s/p anterior MI in 1s treated with TPA and PTCA, ESRD on HD, obesity, h/o gout, OSA, HFrEF  Arhythmia: Numerous episodes of ectopy, atrial as well as nonsustained ventricular tachycardia episodes. No Afib.  Known cardiomyopathy at baseline, although with now recovered LVEF to 45-50%. He is asymptomatic at this time. Increase metoprolol succinate to 50 mg daily.  In future, could consider adding amiodarone.   HFrEF: EF 45-50% (12/2021) with mild global and severe apical hypokinesis. Clinically euvolemic. Continue metoprolol succinate and hydralazine.  Coronary artery disease involving native coronary artery of native heart without angina pectoris No angina symptoms. Continue Aspirin, statin, metoprolol In absence of angina or dyspnea, ischemic workup is not necessary.   F/u in 3 months   Nigel Mormon, MD Pager: [phone] Office: [phone]

## 2022-06-21 DIAGNOSIS — N186 End stage renal disease: Secondary | ICD-10-CM | POA: Diagnosis not present

## 2022-06-21 DIAGNOSIS — Z23 Encounter for immunization: Secondary | ICD-10-CM | POA: Diagnosis not present

## 2022-06-21 DIAGNOSIS — D509 Iron deficiency anemia, unspecified: Secondary | ICD-10-CM | POA: Diagnosis not present

## 2022-06-21 DIAGNOSIS — Z992 Dependence on renal dialysis: Secondary | ICD-10-CM | POA: Diagnosis not present

## 2022-06-21 DIAGNOSIS — T8249XA Other complication of vascular dialysis catheter, initial encounter: Secondary | ICD-10-CM | POA: Diagnosis not present

## 2022-06-21 DIAGNOSIS — N2581 Secondary hyperparathyroidism of renal origin: Secondary | ICD-10-CM | POA: Diagnosis not present

## 2022-06-21 DIAGNOSIS — D631 Anemia in chronic kidney disease: Secondary | ICD-10-CM | POA: Diagnosis not present

## 2022-06-21 DIAGNOSIS — D689 Coagulation defect, unspecified: Secondary | ICD-10-CM | POA: Diagnosis not present

## 2022-06-21 DIAGNOSIS — R52 Pain, unspecified: Secondary | ICD-10-CM | POA: Diagnosis not present

## 2022-06-22 ENCOUNTER — Ambulatory Visit: Payer: Medicare Other | Admitting: Cardiology

## 2022-06-23 DIAGNOSIS — T8249XA Other complication of vascular dialysis catheter, initial encounter: Secondary | ICD-10-CM | POA: Diagnosis not present

## 2022-06-23 DIAGNOSIS — D689 Coagulation defect, unspecified: Secondary | ICD-10-CM | POA: Diagnosis not present

## 2022-06-23 DIAGNOSIS — N186 End stage renal disease: Secondary | ICD-10-CM | POA: Diagnosis not present

## 2022-06-23 DIAGNOSIS — R52 Pain, unspecified: Secondary | ICD-10-CM | POA: Diagnosis not present

## 2022-06-23 DIAGNOSIS — D631 Anemia in chronic kidney disease: Secondary | ICD-10-CM | POA: Diagnosis not present

## 2022-06-23 DIAGNOSIS — D509 Iron deficiency anemia, unspecified: Secondary | ICD-10-CM | POA: Diagnosis not present

## 2022-06-23 DIAGNOSIS — Z992 Dependence on renal dialysis: Secondary | ICD-10-CM | POA: Diagnosis not present

## 2022-06-23 DIAGNOSIS — Z23 Encounter for immunization: Secondary | ICD-10-CM | POA: Diagnosis not present

## 2022-06-23 DIAGNOSIS — N2581 Secondary hyperparathyroidism of renal origin: Secondary | ICD-10-CM | POA: Diagnosis not present

## 2022-06-25 DIAGNOSIS — Z23 Encounter for immunization: Secondary | ICD-10-CM | POA: Diagnosis not present

## 2022-06-25 DIAGNOSIS — D631 Anemia in chronic kidney disease: Secondary | ICD-10-CM | POA: Diagnosis not present

## 2022-06-25 DIAGNOSIS — T8249XA Other complication of vascular dialysis catheter, initial encounter: Secondary | ICD-10-CM | POA: Diagnosis not present

## 2022-06-25 DIAGNOSIS — R52 Pain, unspecified: Secondary | ICD-10-CM | POA: Diagnosis not present

## 2022-06-25 DIAGNOSIS — N2581 Secondary hyperparathyroidism of renal origin: Secondary | ICD-10-CM | POA: Diagnosis not present

## 2022-06-25 DIAGNOSIS — Z992 Dependence on renal dialysis: Secondary | ICD-10-CM | POA: Diagnosis not present

## 2022-06-25 DIAGNOSIS — D689 Coagulation defect, unspecified: Secondary | ICD-10-CM | POA: Diagnosis not present

## 2022-06-25 DIAGNOSIS — D509 Iron deficiency anemia, unspecified: Secondary | ICD-10-CM | POA: Diagnosis not present

## 2022-06-25 DIAGNOSIS — N186 End stage renal disease: Secondary | ICD-10-CM | POA: Diagnosis not present

## 2022-06-27 DIAGNOSIS — T82858A Stenosis of vascular prosthetic devices, implants and grafts, initial encounter: Secondary | ICD-10-CM | POA: Diagnosis not present

## 2022-06-27 DIAGNOSIS — Z992 Dependence on renal dialysis: Secondary | ICD-10-CM | POA: Diagnosis not present

## 2022-06-27 DIAGNOSIS — T82868A Thrombosis of vascular prosthetic devices, implants and grafts, initial encounter: Secondary | ICD-10-CM | POA: Diagnosis not present

## 2022-06-27 DIAGNOSIS — N186 End stage renal disease: Secondary | ICD-10-CM | POA: Diagnosis not present

## 2022-06-27 DIAGNOSIS — I871 Compression of vein: Secondary | ICD-10-CM | POA: Diagnosis not present

## 2022-06-28 DIAGNOSIS — Z23 Encounter for immunization: Secondary | ICD-10-CM | POA: Diagnosis not present

## 2022-06-28 DIAGNOSIS — N2581 Secondary hyperparathyroidism of renal origin: Secondary | ICD-10-CM | POA: Diagnosis not present

## 2022-06-28 DIAGNOSIS — T8249XA Other complication of vascular dialysis catheter, initial encounter: Secondary | ICD-10-CM | POA: Diagnosis not present

## 2022-06-28 DIAGNOSIS — D689 Coagulation defect, unspecified: Secondary | ICD-10-CM | POA: Diagnosis not present

## 2022-06-28 DIAGNOSIS — D509 Iron deficiency anemia, unspecified: Secondary | ICD-10-CM | POA: Diagnosis not present

## 2022-06-28 DIAGNOSIS — N186 End stage renal disease: Secondary | ICD-10-CM | POA: Diagnosis not present

## 2022-06-28 DIAGNOSIS — Z992 Dependence on renal dialysis: Secondary | ICD-10-CM | POA: Diagnosis not present

## 2022-06-28 DIAGNOSIS — D631 Anemia in chronic kidney disease: Secondary | ICD-10-CM | POA: Diagnosis not present

## 2022-06-28 DIAGNOSIS — R52 Pain, unspecified: Secondary | ICD-10-CM | POA: Diagnosis not present

## 2022-06-30 ENCOUNTER — Emergency Department (HOSPITAL_COMMUNITY)
Admission: EM | Admit: 2022-06-30 | Discharge: 2022-06-30 | Disposition: A | Discharge status: Home / Self Care | Payer: Medicare Other | Attending: Emergency Medicine | Admitting: Emergency Medicine

## 2022-06-30 ENCOUNTER — Encounter (HOSPITAL_COMMUNITY): Payer: Self-pay | Admitting: Emergency Medicine

## 2022-06-30 ENCOUNTER — Emergency Department (HOSPITAL_COMMUNITY): Payer: Medicare Other

## 2022-06-30 DIAGNOSIS — I509 Heart failure, unspecified: Secondary | ICD-10-CM | POA: Insufficient documentation

## 2022-06-30 DIAGNOSIS — I132 Hypertensive heart and chronic kidney disease with heart failure and with stage 5 chronic kidney disease, or end stage renal disease: Secondary | ICD-10-CM | POA: Insufficient documentation

## 2022-06-30 DIAGNOSIS — Z992 Dependence on renal dialysis: Secondary | ICD-10-CM | POA: Insufficient documentation

## 2022-06-30 DIAGNOSIS — E1122 Type 2 diabetes mellitus with diabetic chronic kidney disease: Secondary | ICD-10-CM | POA: Insufficient documentation

## 2022-06-30 DIAGNOSIS — N186 End stage renal disease: Secondary | ICD-10-CM | POA: Diagnosis not present

## 2022-06-30 DIAGNOSIS — R6889 Other general symptoms and signs: Secondary | ICD-10-CM | POA: Diagnosis not present

## 2022-06-30 DIAGNOSIS — D689 Coagulation defect, unspecified: Secondary | ICD-10-CM | POA: Diagnosis not present

## 2022-06-30 DIAGNOSIS — Z7982 Long term (current) use of aspirin: Secondary | ICD-10-CM | POA: Diagnosis not present

## 2022-06-30 DIAGNOSIS — Z743 Need for continuous supervision: Secondary | ICD-10-CM | POA: Diagnosis not present

## 2022-06-30 DIAGNOSIS — I252 Old myocardial infarction: Secondary | ICD-10-CM | POA: Diagnosis not present

## 2022-06-30 DIAGNOSIS — R29818 Other symptoms and signs involving the nervous system: Secondary | ICD-10-CM | POA: Diagnosis not present

## 2022-06-30 DIAGNOSIS — Z23 Encounter for immunization: Secondary | ICD-10-CM | POA: Diagnosis not present

## 2022-06-30 DIAGNOSIS — R739 Hyperglycemia, unspecified: Secondary | ICD-10-CM | POA: Diagnosis not present

## 2022-06-30 DIAGNOSIS — D631 Anemia in chronic kidney disease: Secondary | ICD-10-CM | POA: Diagnosis not present

## 2022-06-30 DIAGNOSIS — I499 Cardiac arrhythmia, unspecified: Secondary | ICD-10-CM | POA: Diagnosis not present

## 2022-06-30 DIAGNOSIS — R52 Pain, unspecified: Secondary | ICD-10-CM | POA: Diagnosis not present

## 2022-06-30 DIAGNOSIS — R202 Paresthesia of skin: Secondary | ICD-10-CM | POA: Insufficient documentation

## 2022-06-30 DIAGNOSIS — Z794 Long term (current) use of insulin: Secondary | ICD-10-CM | POA: Diagnosis not present

## 2022-06-30 DIAGNOSIS — I6782 Cerebral ischemia: Secondary | ICD-10-CM | POA: Diagnosis not present

## 2022-06-30 DIAGNOSIS — Z79899 Other long term (current) drug therapy: Secondary | ICD-10-CM | POA: Insufficient documentation

## 2022-06-30 DIAGNOSIS — R531 Weakness: Secondary | ICD-10-CM | POA: Diagnosis not present

## 2022-06-30 DIAGNOSIS — T8249XA Other complication of vascular dialysis catheter, initial encounter: Secondary | ICD-10-CM | POA: Diagnosis not present

## 2022-06-30 DIAGNOSIS — R410 Disorientation, unspecified: Secondary | ICD-10-CM | POA: Diagnosis not present

## 2022-06-30 DIAGNOSIS — N2581 Secondary hyperparathyroidism of renal origin: Secondary | ICD-10-CM | POA: Diagnosis not present

## 2022-06-30 DIAGNOSIS — D509 Iron deficiency anemia, unspecified: Secondary | ICD-10-CM | POA: Diagnosis not present

## 2022-06-30 LAB — COMPREHENSIVE METABOLIC PANEL
ALT: 21 U/L (ref 0–44)
AST: 23 U/L (ref 15–41)
Albumin: 3.3 g/dL — ABNORMAL LOW (ref 3.5–5.0)
Alkaline Phosphatase: 88 U/L (ref 38–126)
Anion gap: 11 (ref 5–15)
BUN: 13 mg/dL (ref 8–23)
CO2: 27 mmol/L (ref 22–32)
Calcium: 8.7 mg/dL — ABNORMAL LOW (ref 8.9–10.3)
Chloride: 95 mmol/L — ABNORMAL LOW (ref 98–111)
Creatinine, Ser: 2.03 mg/dL — ABNORMAL HIGH (ref 0.61–1.24)
GFR, Estimated: 32 mL/min — ABNORMAL LOW (ref >60)
Glucose, Bld: 290 mg/dL — ABNORMAL HIGH (ref 70–99)
Potassium: 3.7 mmol/L (ref 3.5–5.1)
Sodium: 133 mmol/L — ABNORMAL LOW (ref 135–145)
Total Bilirubin: 1.1 mg/dL (ref 0.3–1.2)
Total Protein: 7.1 g/dL (ref 6.5–8.1)

## 2022-06-30 LAB — CBC
HCT: 32 % — ABNORMAL LOW (ref 39.0–52.0)
Hemoglobin: 11.2 g/dL — ABNORMAL LOW (ref 13.0–17.0)
MCH: 34.7 pg — ABNORMAL HIGH (ref 26.0–34.0)
MCHC: 35 g/dL (ref 30.0–36.0)
MCV: 99.1 fL (ref 80.0–100.0)
Platelets: 158 10*3/uL (ref 150–400)
RBC: 3.23 MIL/uL — ABNORMAL LOW (ref 4.22–5.81)
RDW: 13.6 % (ref 11.5–15.5)
WBC: 5.3 10*3/uL (ref 4.0–10.5)
nRBC: 0 % (ref 0.0–0.2)

## 2022-06-30 LAB — I-STAT CHEM 8, ED
BUN: 13 mg/dL (ref 8–23)
Calcium, Ion: 1.01 mmol/L — ABNORMAL LOW (ref 1.15–1.40)
Chloride: 93 mmol/L — ABNORMAL LOW (ref 98–111)
Creatinine, Ser: 2 mg/dL — ABNORMAL HIGH (ref 0.61–1.24)
Glucose, Bld: 291 mg/dL — ABNORMAL HIGH (ref 70–99)
HCT: 34 % — ABNORMAL LOW (ref 39.0–52.0)
Hemoglobin: 11.6 g/dL — ABNORMAL LOW (ref 13.0–17.0)
Potassium: 3.7 mmol/L (ref 3.5–5.1)
Sodium: 134 mmol/L — ABNORMAL LOW (ref 135–145)
TCO2: 28 mmol/L (ref 22–32)

## 2022-06-30 LAB — APTT: aPTT: 34 seconds (ref 24–36)

## 2022-06-30 LAB — DIFFERENTIAL
Abs Immature Granulocytes: 0.02 K/uL (ref 0.00–0.07)
Basophils Absolute: 0.1 K/uL (ref 0.0–0.1)
Basophils Relative: 1 %
Eosinophils Absolute: 0.2 K/uL (ref 0.0–0.5)
Eosinophils Relative: 4 %
Immature Granulocytes: 0 %
Lymphocytes Relative: 21 %
Lymphs Abs: 1.1 K/uL (ref 0.7–4.0)
Monocytes Absolute: 0.6 K/uL (ref 0.1–1.0)
Monocytes Relative: 12 %
Neutro Abs: 3.3 K/uL (ref 1.7–7.7)
Neutrophils Relative %: 62 %

## 2022-06-30 LAB — HEMOGLOBIN A1C
Hgb A1c MFr Bld: 8 % — ABNORMAL HIGH (ref 4.8–5.6)
Mean Plasma Glucose: 182.9 mg/dL

## 2022-06-30 LAB — PROTIME-INR
INR: 1 (ref 0.8–1.2)
Prothrombin Time: 13 seconds (ref 11.4–15.2)

## 2022-06-30 LAB — CBG MONITORING, ED: Glucose-Capillary: 283 mg/dL — ABNORMAL HIGH (ref 70–99)

## 2022-06-30 LAB — ETHANOL: Alcohol, Ethyl (B): <10 mg/dL

## 2022-06-30 MED ORDER — STROKE: EARLY STAGES OF RECOVERY BOOK: Freq: Once | Status: DC

## 2022-06-30 NOTE — Code Documentation (Signed)
Robert Lucas is an 81 yr old male arriving to [facility] via EMS on 06/30/2022 with a PMH of ESRD on dialysis, DM, CAD, HLD, HTN. He is not on any blood thinners. Pt is from Hemodialysis, where he had a sudden onset of left sided weakness and confusion.   Stroke team at the bridge on patient arrival. Labs, CBG drawn, airway cleared by EDP. Pt to CT with team. NIHSS 0. Pt with no deficits on exam. The following imaging was completed: CT head. Per Dr. Rory Percy, CT is neg for acute abnormality.   Pt returned to room 31 where his workup will continue. He will need q 2 hr VS and NIHSS. Re-activate code stroke for recurrence of stroke symptoms. Pt not eligible for thrombolytic as currently asymptomatic. No IR as clinical exam LVO negative. Bedside handoff with Katrina RN complete.

## 2022-06-30 NOTE — Consult Note (Signed)
Neurology Consultation  Reason for Consult: Code Stroke Referring Physician: Trifan  CC: left side weakness, confusion  History is obtained from:Patient, EMS  HPI: Robert Lucas is a 81 y.o. male with a PMH of arthritis, CHF, CKD with HD MWF, AV fistula in the left arm, CAD, DM2, HLD, HTN, MI, atrial tachycardia, PNA who was at dialysis when he had a witnessed onset of confusion and left sided weakness. On arrival BP is 154/84, glucose is 291. Cr 2.00. He did miss the last 12 minutes of his dialysis session today.  Patient does not report any focal deficits. No prior history of stroke.    LKW: 1457 hrs as provided by EMS IV thrombolysis given?: no, too mild to treat EVT:No, No LVO Premorbid modified Rankin scale (mRS):  1-No significant post stroke disability and can perform usual duties with stroke symptoms   ROS: Unable to obtain due to altered mental status.   Past Medical History:  Diagnosis Date   Arthritis    CHF (congestive heart failure) (HCC)    HFrEF   CKD (chronic kidney disease)    sees [place] in [place]   Coronary artery disease    Diabetes ([place])    type 2   sees endo at [place] in [place]   Hypercholesteremia    Hypertension    Myocardial infarction [facility] 1990   Neuromuscular disorder ([place])    Pneumonia    PONV (postoperative nausea and vomiting)     Family History  Problem Relation Age of Onset   Dementia Mother    Stroke Brother      Social History:   reports that he quit smoking about 34 years ago. His smoking use included cigars. He has never been exposed to tobacco smoke. He has never used smokeless tobacco. He reports that he does not currently use alcohol. He reports that he does not use drugs.  Medications No current facility-administered medications for this encounter.  Current Outpatient Medications:    acetaminophen (TYLENOL) 650 MG CR tablet, Take 1,300 mg by mouth in the morning, at noon, and at bedtime., Disp: , Rfl:     allopurinol (ZYLOPRIM) 100 MG tablet, Take 100 mg by mouth in the morning., Disp: , Rfl:    aspirin EC 81 MG tablet, Take 81 mg by mouth daily with supper., Disp: , Rfl:    atorvastatin (LIPITOR) 40 MG tablet, Take 40 mg by mouth at bedtime., Disp: , Rfl:    CALCITRIOL PO, Take 2 capsules by mouth every Monday, Wednesday, and Friday with hemodialysis., Disp: , Rfl:    finasteride (PROSCAR) 5 MG tablet, Take 5 mg by mouth daily with supper., Disp: , Rfl:    gabapentin (NEURONTIN) 300 MG capsule, Take 1 capsule (300 mg total) by mouth at bedtime., Disp: 90 capsule, Rfl: 3   insulin aspart protamine - aspart (NOVOLOG MIX 70/30 FLEXPEN) (70-30) 100 UNIT/ML FlexPen, Inject 14-20 Units into the skin daily as needed (high blood sugar). If BS is <150=0 units, If BS is 150-200 units=14 units, If BS is >201=20 units, Disp: , Rfl:    IRON SUCROSE IV, Inject into the vein as needed. At dialysis, Disp: , Rfl:    Methoxy PEG-Epoetin Beta (MIRCERA IJ), Mircera, Disp: , Rfl:    metoprolol succinate (TOPROL-XL) 50 MG 24 hr tablet, Take 1 tablet (50 mg total) by mouth daily. Take with or immediately following a meal., Disp: 90 tablet, Rfl: 3   tamsulosin (FLOMAX) 0.4 MG CAPS capsule, Take 1 capsule (0.4  mg total) by mouth daily after supper. (Patient taking differently: Take 0.4 mg by mouth at bedtime.), Disp: 30 capsule, Rfl: 0   traMADol (ULTRAM) 50 MG tablet, Take 1 tablet (50 mg total) by mouth every 6 (six) hours as needed. (Patient not taking: Reported on 06/20/2022), Disp: 20 tablet, Rfl: 0   Exam: Current vital signs: There were no vitals taken for this visit. Vital signs in last 24 hours:    GENERAL: Awake, alert in NAD HEENT: - Normocephalic and atraumatic, dry mm, no LN++, no Thyromegally LUNGS - Clear to auscultation bilaterally with no wheezes CV - S1S2 RRR, no m/r/g, equal pulses bilaterally. ABDOMEN - Soft, nontender, nondistended with normoactive BS Ext: warm, well perfused, intact peripheral  pulses, no edema  NEURO:  Mental Status: AA&Ox3 but has some confusion with multi step commands Language: speech is clear.  Naming, repetition, fluency, and comprehension intact. Cranial Nerves: PERRL 60mm EOMI, visual fields full, no facial asymmetry, facial sensation intact, hearing intact, tongue/uvula/soft palate midline, normal sternocleidomastoid and trapezius muscle strength. No evidence of tongue atrophy or fibrillations Motor:  RUE 5/5  LUE 5/5 RLE 5/5  LLE 5/5 Tone: is normal and bulk is normal Sensation- Intact to light touch bilaterally Coordination: FTN intact bilaterally, no ataxia in BLE. Gait- deferred  NIHSS-0   Labs I have reviewed labs in epic and the results pertinent to this consultation are:  CBC    Component Value Date/Time   WBC 6.0 10/19/2021 0351   RBC 2.88 (L) 10/19/2021 0351   HGB 10.5 (L) 05/02/2022 0836   HCT 31.0 (L) 05/02/2022 0836   PLT 95 (L) 10/19/2021 0351   MCV 95.5 10/19/2021 0351   MCH 31.3 10/19/2021 0351   MCHC 32.7 10/19/2021 0351   RDW 16.1 (H) 10/19/2021 0351   LYMPHSABS 0.8 10/19/2021 0351   MONOABS 0.9 10/19/2021 0351   EOSABS 0.2 10/19/2021 0351   BASOSABS 0.0 10/19/2021 0351    CMP     Component Value Date/Time   NA 137 05/02/2022 0836   K 3.7 05/02/2022 0836   CL 99 05/02/2022 0836   CO2 26 10/19/2021 0351   GLUCOSE 81 05/02/2022 0836   BUN 23 05/02/2022 0836   CREATININE 3.90 (H) 05/02/2022 0836   CALCIUM 7.9 (L) 10/19/2021 0351   CALCIUM 7.6 (L) 08/26/2021 0050   PROT 6.1 (L) 10/19/2021 0351   ALBUMIN 3.0 (L) 10/19/2021 0351   AST 24 10/19/2021 0351   ALT 19 10/19/2021 0351   ALKPHOS 73 10/19/2021 0351   BILITOT 1.2 10/19/2021 0351   GFRNONAA 17 (L) 10/19/2021 0351   GFRAA 26 (L) 05/07/2019 0234    Lipid Panel     Component Value Date/Time   CHOL 108 10/15/2021 1055   TRIG 154 (H) 10/15/2021 1055   HDL 31 (L) 10/15/2021 1055   CHOLHDL 3.5 10/15/2021 1055   VLDL 31 10/15/2021 1055   LDLCALC 46  10/15/2021 1055     Imaging I have reviewed the images obtained:  CT-head- No acute intracranial abnormality, Mild SVD  MRI examination of the brain- Pending MRI Head- Pending   Assessment:  81 y.o. male with a PMH of arthritis, CHF, CKD with HD MWF, AV fistula in the left arm, CAD, DM2, HLD, HTN, MI, atrial tachycardia, PNA who was at dialysis when he had a witnessed onset of confusion and left sided weakness. On arrival BP is 154/84, glucose is 291.  Symptoms happened in dialysis-suspect dialysis disequilibrium versus stroke.  Stroke scale 0-no need  for IV thrombolysis Differentials include dialysis disequilibrium versus stroke  Impression: Dialysis disequilibrium versus stroke  Recommendations: - Encephalopathy work up-UA, chest x-ray, B12, TSH. -Stat MRI brain-if positive, admit for stroke workup. - Stroke work up if MRI is positive should include:  - HgbA1c, fasting lipid panel - MRI, MRA  of the brain without contrast - Frequent neuro checks - Echocardiogram - Carotid dopplers - Prophylactic therapy-Antiplatelet med: Aspirin - dose 325mg  PO or 300mg  PR - Risk factor modification - Telemetry monitoring - PT consult, OT consult, Speech consult - Stroke team to follow -- Patient seen and examined by NP/APP with MD. MD to update note as needed.   Janine Ores, DNP, FNP-BC [facility] Pager: [phone]  -- Amie Portland, MD Neurologist [facility] Pager: [phone]

## 2022-06-30 NOTE — ED Provider Notes (Signed)
[facility]GENCY DEPARTMENT Provider Note   CSN: [id number] Arrival date & time: 06/30/22  1554  An emergency department physician performed an initial assessment on this suspected stroke patient at 1555.  History  Chief Complaint  Patient presents with   Code Stroke    Robert Lucas is a 81 y.o. male with history of end-stage renal disease, on dialysis, history of high cholesterol, presenting from dialysis with concern for left-sided weakness.  The patient was reportedly getting dialysis and his staff member noted that his left arm and leg suddenly became weaker at 1458.  EMS reports that the patient appeared improved back to baseline on their arrival.  Perhaps and paresthesias of the face.  The patient himself denies headache and has no complaints does not feel weak.  HPI     Home Medications Prior to Admission medications   Medication Sig Start Date End Date Taking? Authorizing Provider  acetaminophen (TYLENOL) 650 MG CR tablet Take 1,300 mg by mouth in the morning, at noon, and at bedtime.    [provider]  allopurinol (ZYLOPRIM) 100 MG tablet Take 100 mg by mouth in the morning.    [provider]  aspirin EC 81 MG tablet Take 81 mg by mouth daily with supper.    [provider]  atorvastatin (LIPITOR) 40 MG tablet Take 40 mg by mouth at bedtime.    [provider]  CALCITRIOL PO Take 2 capsules by mouth every Monday, Wednesday, and Friday with hemodialysis.    [provider]  finasteride (PROSCAR) 5 MG tablet Take 5 mg by mouth daily with supper.    [provider]  gabapentin (NEURONTIN) 300 MG capsule Take 1 capsule (300 mg total) by mouth at bedtime. 06/09/22   Ulyses Amor, PA-C  insulin aspart protamine - aspart (NOVOLOG MIX 70/30 FLEXPEN) (70-30) 100 UNIT/ML FlexPen Inject 14-20 Units into the skin daily as needed (high blood sugar). If BS is <150=0 units, If BS is 150-200 units=14 units,  If BS is >201=20 units    [provider]  IRON SUCROSE IV Inject into the vein as needed. At dialysis    [provider]  Methoxy PEG-Epoetin Beta (MIRCERA IJ) Mircera 05/15/22 05/14/23  [provider]  metoprolol succinate (TOPROL-XL) 50 MG 24 hr tablet Take 1 tablet (50 mg total) by mouth daily. Take with or immediately following a meal. 06/20/22   Patwardhan, Manish J, MD  tamsulosin (FLOMAX) 0.4 MG CAPS capsule Take 1 capsule (0.4 mg total) by mouth daily after supper. Patient taking differently: Take 0.4 mg by mouth at bedtime. 07/11/20   Florencia Reasons, MD  traMADol (ULTRAM) 50 MG tablet Take 1 tablet (50 mg total) by mouth every 6 (six) hours as needed. Patient not taking: Reported on 06/20/2022 05/02/22   Dagoberto Ligas, PA-C      Allergies    Zestril [lisinopril], Hydrocodone, Januvia [sitagliptin], Levitra [vardenafil], Prednisone, Victoza [liraglutide], and Penicillins    Review of Systems   Review of Systems  Physical Exam Updated Vital Signs BP (!) 140/71   Pulse 78   Temp 98.1 F (36.7 C)   Resp 18   Ht 5\' 3"  (1.6 m)   Wt 71.2 kg   SpO2 98%   BMI 27.81 kg/m  Physical Exam Constitutional:      General: He is not in acute distress. HENT:     Head: Normocephalic and atraumatic.  Eyes:     Conjunctiva/sclera: Conjunctivae normal.  Pupils: Pupils are equal, round, and reactive to light.  Cardiovascular:     Rate and Rhythm: Normal rate and regular rhythm.  Pulmonary:     Effort: Pulmonary effort is normal. No respiratory distress.  Abdominal:     General: There is no distension.     Tenderness: There is no abdominal tenderness.  Skin:    General: Skin is warm and dry.     Comments: Fistula left proximal arm  Neurological:     General: No focal deficit present.     Mental Status: He is alert and oriented to person, place, and time. Mental status is at baseline.     Sensory: No sensory deficit.     Motor: No weakness.  Psychiatric:         Mood and Affect: Mood normal.        Behavior: Behavior normal.     ED Results / Procedures / Treatments   Labs (all labs ordered are listed, but only abnormal results are displayed) Labs Reviewed  CBC - Abnormal; Notable for the following components:      Result Value   RBC 3.23 (*)    Hemoglobin 11.2 (*)    HCT 32.0 (*)    MCH 34.7 (*)    All other components within normal limits  COMPREHENSIVE METABOLIC PANEL - Abnormal; Notable for the following components:   Sodium 133 (*)    Chloride 95 (*)    Glucose, Bld 290 (*)    Creatinine, Ser 2.03 (*)    Calcium 8.7 (*)    Albumin 3.3 (*)    GFR, Estimated 32 (*)    All other components within normal limits  HEMOGLOBIN A1C - Abnormal; Notable for the following components:   Hgb A1c MFr Bld 8.0 (*)    All other components within normal limits  I-STAT CHEM 8, ED - Abnormal; Notable for the following components:   Sodium 134 (*)    Chloride 93 (*)    Creatinine, Ser 2.00 (*)    Glucose, Bld 291 (*)    Calcium, Ion 1.01 (*)    Hemoglobin 11.6 (*)    HCT 34.0 (*)    All other components within normal limits  CBG MONITORING, ED - Abnormal; Notable for the following components:   Glucose-Capillary 283 (*)    All other components within normal limits  ETHANOL  PROTIME-INR  APTT  DIFFERENTIAL  RAPID URINE DRUG SCREEN, HOSP PERFORMED  URINALYSIS, ROUTINE W REFLEX MICROSCOPIC    EKG EKG Interpretation  Date/Time:  Friday June 30 2022 17:09:52 EST Ventricular Rate:  74 PR Interval:  151 QRS Duration: 94 QT Interval:  456 QTC Calculation: 506 R Axis:   118 Text Interpretation: Sinus rhythm Multiform ventricular premature complexes Prolonged QT interval Confirmed by Octaviano Glow [phone]) on 06/30/2022 7:46:26 PM  Radiology MR ANGIO HEAD WO CONTRAST  Result Date: 06/30/2022 CLINICAL DATA:  Acute neurologic deficit EXAM: MRI HEAD WITHOUT CONTRAST MRA HEAD WITHOUT CONTRAST TECHNIQUE: Multiplanar, multi-echo  pulse sequences of the brain and surrounding structures were acquired without intravenous contrast. Angiographic images of the Circle of Willis were acquired using MRA technique without intravenous contrast. COMPARISON:  None Available. FINDINGS: MRI HEAD FINDINGS Brain: No acute infarction, hemorrhage, hydrocephalus, extra-axial collection or mass lesion. No chronic microhemorrhage. There is multifocal hyperintense T2-weighted signal within the periventricular and deep white matter. Mild generalized volume loss. Midline structures are normal. Vascular: Normal flow voids. Skull and upper cervical spine: Normal marrow signal Sinuses/Orbits: Small amount  of left mastoid fluid. Left maxillary retention cyst. Bilateral ocular lens replacements. Other: None MRA HEAD FINDINGS POSTERIOR CIRCULATION: --Vertebral arteries: Normal V4 segments. --Inferior cerebellar arteries: Left dominant --Basilar artery: Normal. --Superior cerebellar arteries: Normal. --Posterior cerebral arteries: Normal. ANTERIOR CIRCULATION: --Intracranial internal carotid arteries: Normal. --Anterior cerebral arteries (ACA): Normal. --Middle cerebral arteries (MCA): Normal. Anatomic variants: None IMPRESSION: 1. No acute intracranial abnormality. 2. Normal intracranial MRA. 3. Mild chronic small vessel disease and volume loss. Electronically Signed   By: Ulyses Jarred M.D.   On: 06/30/2022 19:38   MR BRAIN WO CONTRAST  Result Date: 06/30/2022 CLINICAL DATA:  Acute neurologic deficit EXAM: MRI HEAD WITHOUT CONTRAST MRA HEAD WITHOUT CONTRAST TECHNIQUE: Multiplanar, multi-echo pulse sequences of the brain and surrounding structures were acquired without intravenous contrast. Angiographic images of the Circle of Willis were acquired using MRA technique without intravenous contrast. COMPARISON:  None Available. FINDINGS: MRI HEAD FINDINGS Brain: No acute infarction, hemorrhage, hydrocephalus, extra-axial collection or mass lesion. No chronic  microhemorrhage. There is multifocal hyperintense T2-weighted signal within the periventricular and deep white matter. Mild generalized volume loss. Midline structures are normal. Vascular: Normal flow voids. Skull and upper cervical spine: Normal marrow signal Sinuses/Orbits: Small amount of left mastoid fluid. Left maxillary retention cyst. Bilateral ocular lens replacements. Other: None MRA HEAD FINDINGS POSTERIOR CIRCULATION: --Vertebral arteries: Normal V4 segments. --Inferior cerebellar arteries: Left dominant --Basilar artery: Normal. --Superior cerebellar arteries: Normal. --Posterior cerebral arteries: Normal. ANTERIOR CIRCULATION: --Intracranial internal carotid arteries: Normal. --Anterior cerebral arteries (ACA): Normal. --Middle cerebral arteries (MCA): Normal. Anatomic variants: None IMPRESSION: 1. No acute intracranial abnormality. 2. Normal intracranial MRA. 3. Mild chronic small vessel disease and volume loss. Electronically Signed   By: Ulyses Jarred M.D.   On: 06/30/2022 19:38   CT HEAD CODE STROKE WO CONTRAST  Result Date: 06/30/2022 CLINICAL DATA:  Code stroke.  Insert by history EXAM: CT HEAD WITHOUT CONTRAST TECHNIQUE: Contiguous axial images were obtained from the base of the skull through the vertex without intravenous contrast. RADIATION DOSE REDUCTION: This exam was performed according to the departmental dose-optimization program which includes automated exposure control, adjustment of the mA and/or kV according to patient size and/or use of iterative reconstruction technique. COMPARISON:  None Available. FINDINGS: Brain: There is no evidence of an acute infarct, intracranial hemorrhage, mass, midline shift, or extra-axial fluid collection. The ventricles and sulci are within normal limits for age. Hypodensities in the cerebral white matter bilaterally are nonspecific but compatible with mild chronic small vessel ischemic disease. Vascular: Calcified atherosclerosis at the skull  base. No hyperdense vessel. Skull: No fracture or suspicious osseous lesion. Sinuses/Orbits: Minimal mucosal thickening in the included portions of the paranasal sinuses. Clear mastoid air cells. Bilateral cataract extraction. Other: None. ASPECTS [facility] Stroke Program Early CT Score) - Ganglionic level infarction (caudate, lentiform nuclei, internal capsule, insula, M1-M3 cortex): 7 - Supraganglionic infarction (M4-M6 cortex): 3 Total score (0-10 with 10 being normal): 10 IMPRESSION: 1. No evidence of acute intracranial abnormality. ASPECTS of 10. 2. Mild chronic small vessel ischemic disease. These results were communicated to Dr. Rory Percy at 4:10 pm on 06/30/2022 by text page via the [facility] messaging system. Electronically Signed   By: Logan Bores M.D.   On: 06/30/2022 16:10    Procedures Procedures    Medications Ordered in ED Medications - No data to display  ED Course/ Medical Decision Making/ A&P Clinical Course as of 06/30/22 2331  Fri Jun 30, 2022  2000 No acute findings on MRI.  I  spoke again to Dr Rory Percy from neurology who did not feel that the patient needed further workup from a neurological or stroke perspective emergently in the hospital at this time.  Otherwise patient is asymptomatic in the room and wanting to go home.  Okay for discharge [MT]    Clinical Course User Index [MT] Rasheida Broden, Carola Rhine, MD                           Medical Decision Making Amount and/or Complexity of Data Reviewed Labs: ordered. Radiology: ordered.   This patient presents to the ED with concern for reported left-sided weakness, now resolved. This involves an extensive number of treatment options, and is a complaint that carries with it a high risk of complications and morbidity.  The differential diagnosis includes TIA versus recrudescence of prior cranial insult, versus other  Airway patent on arrival.  +CODE STROKE, neurology team consulted on arrival  Co-morbidities that complicate the patient  evaluation: Cardiovascular risk factors, including diabetes and high cholesterol, at high risk for potential stroke or TIA  Additional history obtained from EMS  I ordered and personally interpreted labs.  The pertinent results include: No emergent findings  I ordered imaging studies including CT head, MRI of the brain I independently visualized and interpreted imaging which showed no acute stroke I agree with the radiologist interpretation  The patient was maintained on a cardiac monitor.  I personally viewed and interpreted the cardiac monitored which showed an underlying rhythm of: Sinus rhythm  Per my interpretation the patient's ECG shows no acute ischemic findings  Test Considered: He has no chest pain or discomfort to suggest that this is atypical ACS.  Doubt spinal injury.  I requested consultation with the neurology,  and discussed lab and imaging findings as well as pertinent plan - they recommend: No further neurological workup given the patient's MRI imaging is unremarkable and he is asymptomatic.  This is unlikely TIA or stroke.   After the interventions noted above, I reevaluated the patient and found that they have: improved  Dispostion:  After consideration of the diagnostic results and the patients response to treatment, I feel that the patent would benefit from outpatient follow-up.  The patient's wife and family was present at the time of review of his workup and discharge.  They verbalized understanding with this plan.  I have a lower suspicion for aortic dissection, PE, ACS, or other life-threatening emergency.  He is asymptomatic at this time.  Okay for discharge         Final Clinical Impression(s) / ED Diagnoses Final diagnoses:  Left-sided weakness    Rx / DC Orders ED Discharge Orders     None         Tamyah Cutbirth, Carola Rhine, MD 06/30/22 [phone]

## 2022-06-30 NOTE — ED Notes (Signed)
Taken to MRI by transporter.

## 2022-06-30 NOTE — ED Triage Notes (Signed)
Pt BIB [facility] from pt hemodialysis center. Staff notice sudden change in patient during his treatment. He was take off dialysis 15 minutes early. EMS reported staff notice pt was having left sided weakness and confusion.

## 2022-06-30 NOTE — ED Notes (Signed)
Unable to give UA sample at this time. Will notify MD. Pt states he rarely urinates as he is on dialysis.

## 2022-06-30 NOTE — ED Notes (Signed)
Patient verbalizes understanding of d/c instructions. Opportunities for questions and answers were provided. Pt d/c from ED and wheeled to lobby with family.  

## 2022-07-02 DIAGNOSIS — E1129 Type 2 diabetes mellitus with other diabetic kidney complication: Secondary | ICD-10-CM | POA: Diagnosis not present

## 2022-07-02 DIAGNOSIS — D689 Coagulation defect, unspecified: Secondary | ICD-10-CM | POA: Diagnosis not present

## 2022-07-02 DIAGNOSIS — N186 End stage renal disease: Secondary | ICD-10-CM | POA: Diagnosis not present

## 2022-07-02 DIAGNOSIS — Z992 Dependence on renal dialysis: Secondary | ICD-10-CM | POA: Diagnosis not present

## 2022-07-02 DIAGNOSIS — D509 Iron deficiency anemia, unspecified: Secondary | ICD-10-CM | POA: Diagnosis not present

## 2022-07-02 DIAGNOSIS — D631 Anemia in chronic kidney disease: Secondary | ICD-10-CM | POA: Diagnosis not present

## 2022-07-02 DIAGNOSIS — N2581 Secondary hyperparathyroidism of renal origin: Secondary | ICD-10-CM | POA: Diagnosis not present

## 2022-07-02 DIAGNOSIS — T8249XA Other complication of vascular dialysis catheter, initial encounter: Secondary | ICD-10-CM | POA: Diagnosis not present

## 2022-07-02 DIAGNOSIS — R52 Pain, unspecified: Secondary | ICD-10-CM | POA: Diagnosis not present

## 2022-07-02 DIAGNOSIS — Z23 Encounter for immunization: Secondary | ICD-10-CM | POA: Diagnosis not present

## 2022-07-05 DIAGNOSIS — Z992 Dependence on renal dialysis: Secondary | ICD-10-CM | POA: Diagnosis not present

## 2022-07-05 DIAGNOSIS — N2581 Secondary hyperparathyroidism of renal origin: Secondary | ICD-10-CM | POA: Diagnosis not present

## 2022-07-05 DIAGNOSIS — T8249XA Other complication of vascular dialysis catheter, initial encounter: Secondary | ICD-10-CM | POA: Diagnosis not present

## 2022-07-05 DIAGNOSIS — D631 Anemia in chronic kidney disease: Secondary | ICD-10-CM | POA: Diagnosis not present

## 2022-07-05 DIAGNOSIS — N186 End stage renal disease: Secondary | ICD-10-CM | POA: Diagnosis not present

## 2022-07-05 DIAGNOSIS — D689 Coagulation defect, unspecified: Secondary | ICD-10-CM | POA: Diagnosis not present

## 2022-07-05 DIAGNOSIS — D509 Iron deficiency anemia, unspecified: Secondary | ICD-10-CM | POA: Diagnosis not present

## 2022-07-07 DIAGNOSIS — N186 End stage renal disease: Secondary | ICD-10-CM | POA: Diagnosis not present

## 2022-07-07 DIAGNOSIS — Z992 Dependence on renal dialysis: Secondary | ICD-10-CM | POA: Diagnosis not present

## 2022-07-07 DIAGNOSIS — D509 Iron deficiency anemia, unspecified: Secondary | ICD-10-CM | POA: Diagnosis not present

## 2022-07-07 DIAGNOSIS — D689 Coagulation defect, unspecified: Secondary | ICD-10-CM | POA: Diagnosis not present

## 2022-07-07 DIAGNOSIS — D631 Anemia in chronic kidney disease: Secondary | ICD-10-CM | POA: Diagnosis not present

## 2022-07-07 DIAGNOSIS — T8249XA Other complication of vascular dialysis catheter, initial encounter: Secondary | ICD-10-CM | POA: Diagnosis not present

## 2022-07-07 DIAGNOSIS — N2581 Secondary hyperparathyroidism of renal origin: Secondary | ICD-10-CM | POA: Diagnosis not present

## 2022-07-10 DIAGNOSIS — N2581 Secondary hyperparathyroidism of renal origin: Secondary | ICD-10-CM | POA: Diagnosis not present

## 2022-07-10 DIAGNOSIS — N186 End stage renal disease: Secondary | ICD-10-CM | POA: Diagnosis not present

## 2022-07-10 DIAGNOSIS — Z992 Dependence on renal dialysis: Secondary | ICD-10-CM | POA: Diagnosis not present

## 2022-07-10 DIAGNOSIS — T8249XA Other complication of vascular dialysis catheter, initial encounter: Secondary | ICD-10-CM | POA: Diagnosis not present

## 2022-07-10 DIAGNOSIS — D509 Iron deficiency anemia, unspecified: Secondary | ICD-10-CM | POA: Diagnosis not present

## 2022-07-10 DIAGNOSIS — D689 Coagulation defect, unspecified: Secondary | ICD-10-CM | POA: Diagnosis not present

## 2022-07-10 DIAGNOSIS — D631 Anemia in chronic kidney disease: Secondary | ICD-10-CM | POA: Diagnosis not present

## 2022-07-11 DIAGNOSIS — M6281 Muscle weakness (generalized): Secondary | ICD-10-CM | POA: Diagnosis not present

## 2022-07-12 DIAGNOSIS — D631 Anemia in chronic kidney disease: Secondary | ICD-10-CM | POA: Diagnosis not present

## 2022-07-12 DIAGNOSIS — D689 Coagulation defect, unspecified: Secondary | ICD-10-CM | POA: Diagnosis not present

## 2022-07-12 DIAGNOSIS — N186 End stage renal disease: Secondary | ICD-10-CM | POA: Diagnosis not present

## 2022-07-12 DIAGNOSIS — T8249XA Other complication of vascular dialysis catheter, initial encounter: Secondary | ICD-10-CM | POA: Diagnosis not present

## 2022-07-12 DIAGNOSIS — Z992 Dependence on renal dialysis: Secondary | ICD-10-CM | POA: Diagnosis not present

## 2022-07-12 DIAGNOSIS — N2581 Secondary hyperparathyroidism of renal origin: Secondary | ICD-10-CM | POA: Diagnosis not present

## 2022-07-12 DIAGNOSIS — D509 Iron deficiency anemia, unspecified: Secondary | ICD-10-CM | POA: Diagnosis not present

## 2022-07-14 DIAGNOSIS — D509 Iron deficiency anemia, unspecified: Secondary | ICD-10-CM | POA: Diagnosis not present

## 2022-07-14 DIAGNOSIS — D631 Anemia in chronic kidney disease: Secondary | ICD-10-CM | POA: Diagnosis not present

## 2022-07-14 DIAGNOSIS — Z992 Dependence on renal dialysis: Secondary | ICD-10-CM | POA: Diagnosis not present

## 2022-07-14 DIAGNOSIS — N186 End stage renal disease: Secondary | ICD-10-CM | POA: Diagnosis not present

## 2022-07-14 DIAGNOSIS — N2581 Secondary hyperparathyroidism of renal origin: Secondary | ICD-10-CM | POA: Diagnosis not present

## 2022-07-14 DIAGNOSIS — T8249XA Other complication of vascular dialysis catheter, initial encounter: Secondary | ICD-10-CM | POA: Diagnosis not present

## 2022-07-14 DIAGNOSIS — D689 Coagulation defect, unspecified: Secondary | ICD-10-CM | POA: Diagnosis not present

## 2022-07-17 DIAGNOSIS — N186 End stage renal disease: Secondary | ICD-10-CM | POA: Diagnosis not present

## 2022-07-17 DIAGNOSIS — Z992 Dependence on renal dialysis: Secondary | ICD-10-CM | POA: Diagnosis not present

## 2022-07-17 DIAGNOSIS — N2581 Secondary hyperparathyroidism of renal origin: Secondary | ICD-10-CM | POA: Diagnosis not present

## 2022-07-17 DIAGNOSIS — D509 Iron deficiency anemia, unspecified: Secondary | ICD-10-CM | POA: Diagnosis not present

## 2022-07-17 DIAGNOSIS — D631 Anemia in chronic kidney disease: Secondary | ICD-10-CM | POA: Diagnosis not present

## 2022-07-17 DIAGNOSIS — D689 Coagulation defect, unspecified: Secondary | ICD-10-CM | POA: Diagnosis not present

## 2022-07-17 DIAGNOSIS — T8249XA Other complication of vascular dialysis catheter, initial encounter: Secondary | ICD-10-CM | POA: Diagnosis not present

## 2022-07-18 DIAGNOSIS — Z452 Encounter for adjustment and management of vascular access device: Secondary | ICD-10-CM | POA: Diagnosis not present

## 2022-07-18 DIAGNOSIS — Z992 Dependence on renal dialysis: Secondary | ICD-10-CM | POA: Diagnosis not present

## 2022-07-18 DIAGNOSIS — N186 End stage renal disease: Secondary | ICD-10-CM | POA: Diagnosis not present

## 2022-07-19 DIAGNOSIS — D631 Anemia in chronic kidney disease: Secondary | ICD-10-CM | POA: Diagnosis not present

## 2022-07-19 DIAGNOSIS — D689 Coagulation defect, unspecified: Secondary | ICD-10-CM | POA: Diagnosis not present

## 2022-07-19 DIAGNOSIS — N186 End stage renal disease: Secondary | ICD-10-CM | POA: Diagnosis not present

## 2022-07-19 DIAGNOSIS — D509 Iron deficiency anemia, unspecified: Secondary | ICD-10-CM | POA: Diagnosis not present

## 2022-07-19 DIAGNOSIS — Z992 Dependence on renal dialysis: Secondary | ICD-10-CM | POA: Diagnosis not present

## 2022-07-19 DIAGNOSIS — N2581 Secondary hyperparathyroidism of renal origin: Secondary | ICD-10-CM | POA: Diagnosis not present

## 2022-07-19 DIAGNOSIS — T8249XA Other complication of vascular dialysis catheter, initial encounter: Secondary | ICD-10-CM | POA: Diagnosis not present

## 2022-07-24 DIAGNOSIS — T8249XA Other complication of vascular dialysis catheter, initial encounter: Secondary | ICD-10-CM | POA: Diagnosis not present

## 2022-07-24 DIAGNOSIS — N186 End stage renal disease: Secondary | ICD-10-CM | POA: Diagnosis not present

## 2022-07-24 DIAGNOSIS — D509 Iron deficiency anemia, unspecified: Secondary | ICD-10-CM | POA: Diagnosis not present

## 2022-07-24 DIAGNOSIS — D631 Anemia in chronic kidney disease: Secondary | ICD-10-CM | POA: Diagnosis not present

## 2022-07-24 DIAGNOSIS — Z992 Dependence on renal dialysis: Secondary | ICD-10-CM | POA: Diagnosis not present

## 2022-07-24 DIAGNOSIS — N2581 Secondary hyperparathyroidism of renal origin: Secondary | ICD-10-CM | POA: Diagnosis not present

## 2022-07-24 DIAGNOSIS — D689 Coagulation defect, unspecified: Secondary | ICD-10-CM | POA: Diagnosis not present

## 2022-07-26 DIAGNOSIS — N2581 Secondary hyperparathyroidism of renal origin: Secondary | ICD-10-CM | POA: Diagnosis not present

## 2022-07-26 DIAGNOSIS — D509 Iron deficiency anemia, unspecified: Secondary | ICD-10-CM | POA: Diagnosis not present

## 2022-07-26 DIAGNOSIS — N186 End stage renal disease: Secondary | ICD-10-CM | POA: Diagnosis not present

## 2022-07-26 DIAGNOSIS — Z992 Dependence on renal dialysis: Secondary | ICD-10-CM | POA: Diagnosis not present

## 2022-07-26 DIAGNOSIS — D689 Coagulation defect, unspecified: Secondary | ICD-10-CM | POA: Diagnosis not present

## 2022-07-26 DIAGNOSIS — T8249XA Other complication of vascular dialysis catheter, initial encounter: Secondary | ICD-10-CM | POA: Diagnosis not present

## 2022-07-26 DIAGNOSIS — D631 Anemia in chronic kidney disease: Secondary | ICD-10-CM | POA: Diagnosis not present

## 2022-07-28 DIAGNOSIS — Z992 Dependence on renal dialysis: Secondary | ICD-10-CM | POA: Diagnosis not present

## 2022-07-28 DIAGNOSIS — D689 Coagulation defect, unspecified: Secondary | ICD-10-CM | POA: Diagnosis not present

## 2022-07-28 DIAGNOSIS — D509 Iron deficiency anemia, unspecified: Secondary | ICD-10-CM | POA: Diagnosis not present

## 2022-07-28 DIAGNOSIS — N2581 Secondary hyperparathyroidism of renal origin: Secondary | ICD-10-CM | POA: Diagnosis not present

## 2022-07-28 DIAGNOSIS — T8249XA Other complication of vascular dialysis catheter, initial encounter: Secondary | ICD-10-CM | POA: Diagnosis not present

## 2022-07-28 DIAGNOSIS — N186 End stage renal disease: Secondary | ICD-10-CM | POA: Diagnosis not present

## 2022-07-28 DIAGNOSIS — D631 Anemia in chronic kidney disease: Secondary | ICD-10-CM | POA: Diagnosis not present

## 2022-07-31 DIAGNOSIS — D509 Iron deficiency anemia, unspecified: Secondary | ICD-10-CM | POA: Diagnosis not present

## 2022-07-31 DIAGNOSIS — N186 End stage renal disease: Secondary | ICD-10-CM | POA: Diagnosis not present

## 2022-07-31 DIAGNOSIS — Z992 Dependence on renal dialysis: Secondary | ICD-10-CM | POA: Diagnosis not present

## 2022-07-31 DIAGNOSIS — T8249XA Other complication of vascular dialysis catheter, initial encounter: Secondary | ICD-10-CM | POA: Diagnosis not present

## 2022-07-31 DIAGNOSIS — D689 Coagulation defect, unspecified: Secondary | ICD-10-CM | POA: Diagnosis not present

## 2022-07-31 DIAGNOSIS — N2581 Secondary hyperparathyroidism of renal origin: Secondary | ICD-10-CM | POA: Diagnosis not present

## 2022-07-31 DIAGNOSIS — D631 Anemia in chronic kidney disease: Secondary | ICD-10-CM | POA: Diagnosis not present

## 2022-08-02 DIAGNOSIS — D689 Coagulation defect, unspecified: Secondary | ICD-10-CM | POA: Diagnosis not present

## 2022-08-02 DIAGNOSIS — T8249XA Other complication of vascular dialysis catheter, initial encounter: Secondary | ICD-10-CM | POA: Diagnosis not present

## 2022-08-02 DIAGNOSIS — N2581 Secondary hyperparathyroidism of renal origin: Secondary | ICD-10-CM | POA: Diagnosis not present

## 2022-08-02 DIAGNOSIS — E1129 Type 2 diabetes mellitus with other diabetic kidney complication: Secondary | ICD-10-CM | POA: Diagnosis not present

## 2022-08-02 DIAGNOSIS — D631 Anemia in chronic kidney disease: Secondary | ICD-10-CM | POA: Diagnosis not present

## 2022-08-02 DIAGNOSIS — Z992 Dependence on renal dialysis: Secondary | ICD-10-CM | POA: Diagnosis not present

## 2022-08-02 DIAGNOSIS — N186 End stage renal disease: Secondary | ICD-10-CM | POA: Diagnosis not present

## 2022-08-02 DIAGNOSIS — D509 Iron deficiency anemia, unspecified: Secondary | ICD-10-CM | POA: Diagnosis not present

## 2022-08-04 DIAGNOSIS — N186 End stage renal disease: Secondary | ICD-10-CM | POA: Diagnosis not present

## 2022-08-04 DIAGNOSIS — D689 Coagulation defect, unspecified: Secondary | ICD-10-CM | POA: Diagnosis not present

## 2022-08-04 DIAGNOSIS — N2581 Secondary hyperparathyroidism of renal origin: Secondary | ICD-10-CM | POA: Diagnosis not present

## 2022-08-04 DIAGNOSIS — Z23 Encounter for immunization: Secondary | ICD-10-CM | POA: Diagnosis not present

## 2022-08-04 DIAGNOSIS — Z992 Dependence on renal dialysis: Secondary | ICD-10-CM | POA: Diagnosis not present

## 2022-08-04 DIAGNOSIS — D631 Anemia in chronic kidney disease: Secondary | ICD-10-CM | POA: Diagnosis not present

## 2022-08-07 DIAGNOSIS — D689 Coagulation defect, unspecified: Secondary | ICD-10-CM | POA: Diagnosis not present

## 2022-08-07 DIAGNOSIS — D631 Anemia in chronic kidney disease: Secondary | ICD-10-CM | POA: Diagnosis not present

## 2022-08-07 DIAGNOSIS — Z23 Encounter for immunization: Secondary | ICD-10-CM | POA: Diagnosis not present

## 2022-08-07 DIAGNOSIS — N2581 Secondary hyperparathyroidism of renal origin: Secondary | ICD-10-CM | POA: Diagnosis not present

## 2022-08-07 DIAGNOSIS — Z992 Dependence on renal dialysis: Secondary | ICD-10-CM | POA: Diagnosis not present

## 2022-08-07 DIAGNOSIS — N186 End stage renal disease: Secondary | ICD-10-CM | POA: Diagnosis not present

## 2022-08-09 DIAGNOSIS — Z23 Encounter for immunization: Secondary | ICD-10-CM | POA: Diagnosis not present

## 2022-08-09 DIAGNOSIS — N186 End stage renal disease: Secondary | ICD-10-CM | POA: Diagnosis not present

## 2022-08-09 DIAGNOSIS — Z992 Dependence on renal dialysis: Secondary | ICD-10-CM | POA: Diagnosis not present

## 2022-08-09 DIAGNOSIS — D631 Anemia in chronic kidney disease: Secondary | ICD-10-CM | POA: Diagnosis not present

## 2022-08-09 DIAGNOSIS — N2581 Secondary hyperparathyroidism of renal origin: Secondary | ICD-10-CM | POA: Diagnosis not present

## 2022-08-09 DIAGNOSIS — D689 Coagulation defect, unspecified: Secondary | ICD-10-CM | POA: Diagnosis not present

## 2022-08-11 DIAGNOSIS — N2581 Secondary hyperparathyroidism of renal origin: Secondary | ICD-10-CM | POA: Diagnosis not present

## 2022-08-11 DIAGNOSIS — Z992 Dependence on renal dialysis: Secondary | ICD-10-CM | POA: Diagnosis not present

## 2022-08-11 DIAGNOSIS — D689 Coagulation defect, unspecified: Secondary | ICD-10-CM | POA: Diagnosis not present

## 2022-08-11 DIAGNOSIS — N186 End stage renal disease: Secondary | ICD-10-CM | POA: Diagnosis not present

## 2022-08-11 DIAGNOSIS — Z23 Encounter for immunization: Secondary | ICD-10-CM | POA: Diagnosis not present

## 2022-08-11 DIAGNOSIS — D631 Anemia in chronic kidney disease: Secondary | ICD-10-CM | POA: Diagnosis not present

## 2022-08-14 DIAGNOSIS — D689 Coagulation defect, unspecified: Secondary | ICD-10-CM | POA: Diagnosis not present

## 2022-08-14 DIAGNOSIS — N2581 Secondary hyperparathyroidism of renal origin: Secondary | ICD-10-CM | POA: Diagnosis not present

## 2022-08-14 DIAGNOSIS — Z23 Encounter for immunization: Secondary | ICD-10-CM | POA: Diagnosis not present

## 2022-08-14 DIAGNOSIS — N186 End stage renal disease: Secondary | ICD-10-CM | POA: Diagnosis not present

## 2022-08-14 DIAGNOSIS — D631 Anemia in chronic kidney disease: Secondary | ICD-10-CM | POA: Diagnosis not present

## 2022-08-14 DIAGNOSIS — Z992 Dependence on renal dialysis: Secondary | ICD-10-CM | POA: Diagnosis not present

## 2022-08-16 DIAGNOSIS — Z992 Dependence on renal dialysis: Secondary | ICD-10-CM | POA: Diagnosis not present

## 2022-08-16 DIAGNOSIS — N186 End stage renal disease: Secondary | ICD-10-CM | POA: Diagnosis not present

## 2022-08-16 DIAGNOSIS — N2581 Secondary hyperparathyroidism of renal origin: Secondary | ICD-10-CM | POA: Diagnosis not present

## 2022-08-16 DIAGNOSIS — D689 Coagulation defect, unspecified: Secondary | ICD-10-CM | POA: Diagnosis not present

## 2022-08-16 DIAGNOSIS — Z23 Encounter for immunization: Secondary | ICD-10-CM | POA: Diagnosis not present

## 2022-08-16 DIAGNOSIS — D631 Anemia in chronic kidney disease: Secondary | ICD-10-CM | POA: Diagnosis not present

## 2022-08-18 DIAGNOSIS — D689 Coagulation defect, unspecified: Secondary | ICD-10-CM | POA: Diagnosis not present

## 2022-08-18 DIAGNOSIS — D631 Anemia in chronic kidney disease: Secondary | ICD-10-CM | POA: Diagnosis not present

## 2022-08-18 DIAGNOSIS — N186 End stage renal disease: Secondary | ICD-10-CM | POA: Diagnosis not present

## 2022-08-18 DIAGNOSIS — Z23 Encounter for immunization: Secondary | ICD-10-CM | POA: Diagnosis not present

## 2022-08-18 DIAGNOSIS — Z992 Dependence on renal dialysis: Secondary | ICD-10-CM | POA: Diagnosis not present

## 2022-08-18 DIAGNOSIS — N2581 Secondary hyperparathyroidism of renal origin: Secondary | ICD-10-CM | POA: Diagnosis not present

## 2022-08-21 DIAGNOSIS — D689 Coagulation defect, unspecified: Secondary | ICD-10-CM | POA: Diagnosis not present

## 2022-08-21 DIAGNOSIS — Z23 Encounter for immunization: Secondary | ICD-10-CM | POA: Diagnosis not present

## 2022-08-21 DIAGNOSIS — N2581 Secondary hyperparathyroidism of renal origin: Secondary | ICD-10-CM | POA: Diagnosis not present

## 2022-08-21 DIAGNOSIS — Z992 Dependence on renal dialysis: Secondary | ICD-10-CM | POA: Diagnosis not present

## 2022-08-21 DIAGNOSIS — D631 Anemia in chronic kidney disease: Secondary | ICD-10-CM | POA: Diagnosis not present

## 2022-08-21 DIAGNOSIS — N186 End stage renal disease: Secondary | ICD-10-CM | POA: Diagnosis not present

## 2022-08-23 DIAGNOSIS — Z23 Encounter for immunization: Secondary | ICD-10-CM | POA: Diagnosis not present

## 2022-08-23 DIAGNOSIS — D689 Coagulation defect, unspecified: Secondary | ICD-10-CM | POA: Diagnosis not present

## 2022-08-23 DIAGNOSIS — N2581 Secondary hyperparathyroidism of renal origin: Secondary | ICD-10-CM | POA: Diagnosis not present

## 2022-08-23 DIAGNOSIS — Z992 Dependence on renal dialysis: Secondary | ICD-10-CM | POA: Diagnosis not present

## 2022-08-23 DIAGNOSIS — N186 End stage renal disease: Secondary | ICD-10-CM | POA: Diagnosis not present

## 2022-08-23 DIAGNOSIS — D631 Anemia in chronic kidney disease: Secondary | ICD-10-CM | POA: Diagnosis not present

## 2022-08-25 DIAGNOSIS — D631 Anemia in chronic kidney disease: Secondary | ICD-10-CM | POA: Diagnosis not present

## 2022-08-25 DIAGNOSIS — Z23 Encounter for immunization: Secondary | ICD-10-CM | POA: Diagnosis not present

## 2022-08-25 DIAGNOSIS — N186 End stage renal disease: Secondary | ICD-10-CM | POA: Diagnosis not present

## 2022-08-25 DIAGNOSIS — N2581 Secondary hyperparathyroidism of renal origin: Secondary | ICD-10-CM | POA: Diagnosis not present

## 2022-08-25 DIAGNOSIS — Z992 Dependence on renal dialysis: Secondary | ICD-10-CM | POA: Diagnosis not present

## 2022-08-25 DIAGNOSIS — D689 Coagulation defect, unspecified: Secondary | ICD-10-CM | POA: Diagnosis not present

## 2022-08-28 DIAGNOSIS — D689 Coagulation defect, unspecified: Secondary | ICD-10-CM | POA: Diagnosis not present

## 2022-08-28 DIAGNOSIS — N2581 Secondary hyperparathyroidism of renal origin: Secondary | ICD-10-CM | POA: Diagnosis not present

## 2022-08-28 DIAGNOSIS — Z23 Encounter for immunization: Secondary | ICD-10-CM | POA: Diagnosis not present

## 2022-08-28 DIAGNOSIS — Z992 Dependence on renal dialysis: Secondary | ICD-10-CM | POA: Diagnosis not present

## 2022-08-28 DIAGNOSIS — N186 End stage renal disease: Secondary | ICD-10-CM | POA: Diagnosis not present

## 2022-08-28 DIAGNOSIS — D631 Anemia in chronic kidney disease: Secondary | ICD-10-CM | POA: Diagnosis not present

## 2022-08-30 DIAGNOSIS — N2581 Secondary hyperparathyroidism of renal origin: Secondary | ICD-10-CM | POA: Diagnosis not present

## 2022-08-30 DIAGNOSIS — N186 End stage renal disease: Secondary | ICD-10-CM | POA: Diagnosis not present

## 2022-08-30 DIAGNOSIS — Z992 Dependence on renal dialysis: Secondary | ICD-10-CM | POA: Diagnosis not present

## 2022-08-30 DIAGNOSIS — D631 Anemia in chronic kidney disease: Secondary | ICD-10-CM | POA: Diagnosis not present

## 2022-08-30 DIAGNOSIS — Z23 Encounter for immunization: Secondary | ICD-10-CM | POA: Diagnosis not present

## 2022-08-30 DIAGNOSIS — D689 Coagulation defect, unspecified: Secondary | ICD-10-CM | POA: Diagnosis not present

## 2022-08-31 DIAGNOSIS — Z992 Dependence on renal dialysis: Secondary | ICD-10-CM | POA: Diagnosis not present

## 2022-08-31 DIAGNOSIS — N186 End stage renal disease: Secondary | ICD-10-CM | POA: Diagnosis not present

## 2022-08-31 DIAGNOSIS — E1129 Type 2 diabetes mellitus with other diabetic kidney complication: Secondary | ICD-10-CM | POA: Diagnosis not present

## 2022-09-01 DIAGNOSIS — N2581 Secondary hyperparathyroidism of renal origin: Secondary | ICD-10-CM | POA: Diagnosis not present

## 2022-09-01 DIAGNOSIS — N186 End stage renal disease: Secondary | ICD-10-CM | POA: Diagnosis not present

## 2022-09-01 DIAGNOSIS — Z992 Dependence on renal dialysis: Secondary | ICD-10-CM | POA: Diagnosis not present

## 2022-09-01 DIAGNOSIS — D689 Coagulation defect, unspecified: Secondary | ICD-10-CM | POA: Diagnosis not present

## 2022-09-01 DIAGNOSIS — D631 Anemia in chronic kidney disease: Secondary | ICD-10-CM | POA: Diagnosis not present

## 2022-09-01 DIAGNOSIS — E1122 Type 2 diabetes mellitus with diabetic chronic kidney disease: Secondary | ICD-10-CM | POA: Diagnosis not present

## 2022-09-04 DIAGNOSIS — N2581 Secondary hyperparathyroidism of renal origin: Secondary | ICD-10-CM | POA: Diagnosis not present

## 2022-09-04 DIAGNOSIS — Z992 Dependence on renal dialysis: Secondary | ICD-10-CM | POA: Diagnosis not present

## 2022-09-04 DIAGNOSIS — D689 Coagulation defect, unspecified: Secondary | ICD-10-CM | POA: Diagnosis not present

## 2022-09-04 DIAGNOSIS — N186 End stage renal disease: Secondary | ICD-10-CM | POA: Diagnosis not present

## 2022-09-04 DIAGNOSIS — D631 Anemia in chronic kidney disease: Secondary | ICD-10-CM | POA: Diagnosis not present

## 2022-09-04 DIAGNOSIS — E1122 Type 2 diabetes mellitus with diabetic chronic kidney disease: Secondary | ICD-10-CM | POA: Diagnosis not present

## 2022-09-06 DIAGNOSIS — D689 Coagulation defect, unspecified: Secondary | ICD-10-CM | POA: Diagnosis not present

## 2022-09-06 DIAGNOSIS — E1122 Type 2 diabetes mellitus with diabetic chronic kidney disease: Secondary | ICD-10-CM | POA: Diagnosis not present

## 2022-09-06 DIAGNOSIS — N2581 Secondary hyperparathyroidism of renal origin: Secondary | ICD-10-CM | POA: Diagnosis not present

## 2022-09-06 DIAGNOSIS — N186 End stage renal disease: Secondary | ICD-10-CM | POA: Diagnosis not present

## 2022-09-06 DIAGNOSIS — D631 Anemia in chronic kidney disease: Secondary | ICD-10-CM | POA: Diagnosis not present

## 2022-09-06 DIAGNOSIS — Z992 Dependence on renal dialysis: Secondary | ICD-10-CM | POA: Diagnosis not present

## 2022-09-08 DIAGNOSIS — D689 Coagulation defect, unspecified: Secondary | ICD-10-CM | POA: Diagnosis not present

## 2022-09-08 DIAGNOSIS — N2581 Secondary hyperparathyroidism of renal origin: Secondary | ICD-10-CM | POA: Diagnosis not present

## 2022-09-08 DIAGNOSIS — N186 End stage renal disease: Secondary | ICD-10-CM | POA: Diagnosis not present

## 2022-09-08 DIAGNOSIS — E1122 Type 2 diabetes mellitus with diabetic chronic kidney disease: Secondary | ICD-10-CM | POA: Diagnosis not present

## 2022-09-08 DIAGNOSIS — D631 Anemia in chronic kidney disease: Secondary | ICD-10-CM | POA: Diagnosis not present

## 2022-09-08 DIAGNOSIS — Z992 Dependence on renal dialysis: Secondary | ICD-10-CM | POA: Diagnosis not present

## 2022-09-11 DIAGNOSIS — E1122 Type 2 diabetes mellitus with diabetic chronic kidney disease: Secondary | ICD-10-CM | POA: Diagnosis not present

## 2022-09-11 DIAGNOSIS — N2581 Secondary hyperparathyroidism of renal origin: Secondary | ICD-10-CM | POA: Diagnosis not present

## 2022-09-11 DIAGNOSIS — D689 Coagulation defect, unspecified: Secondary | ICD-10-CM | POA: Diagnosis not present

## 2022-09-11 DIAGNOSIS — Z992 Dependence on renal dialysis: Secondary | ICD-10-CM | POA: Diagnosis not present

## 2022-09-11 DIAGNOSIS — D631 Anemia in chronic kidney disease: Secondary | ICD-10-CM | POA: Diagnosis not present

## 2022-09-11 DIAGNOSIS — N186 End stage renal disease: Secondary | ICD-10-CM | POA: Diagnosis not present

## 2022-09-13 DIAGNOSIS — Z992 Dependence on renal dialysis: Secondary | ICD-10-CM | POA: Diagnosis not present

## 2022-09-13 DIAGNOSIS — E1122 Type 2 diabetes mellitus with diabetic chronic kidney disease: Secondary | ICD-10-CM | POA: Diagnosis not present

## 2022-09-13 DIAGNOSIS — N186 End stage renal disease: Secondary | ICD-10-CM | POA: Diagnosis not present

## 2022-09-13 DIAGNOSIS — D631 Anemia in chronic kidney disease: Secondary | ICD-10-CM | POA: Diagnosis not present

## 2022-09-13 DIAGNOSIS — N2581 Secondary hyperparathyroidism of renal origin: Secondary | ICD-10-CM | POA: Diagnosis not present

## 2022-09-13 DIAGNOSIS — D689 Coagulation defect, unspecified: Secondary | ICD-10-CM | POA: Diagnosis not present

## 2022-09-14 DIAGNOSIS — E1122 Type 2 diabetes mellitus with diabetic chronic kidney disease: Secondary | ICD-10-CM | POA: Diagnosis not present

## 2022-09-14 DIAGNOSIS — M961 Postlaminectomy syndrome, not elsewhere classified: Secondary | ICD-10-CM | POA: Diagnosis not present

## 2022-09-14 DIAGNOSIS — M5136 Other intervertebral disc degeneration, lumbar region: Secondary | ICD-10-CM | POA: Diagnosis not present

## 2022-09-15 DIAGNOSIS — N186 End stage renal disease: Secondary | ICD-10-CM | POA: Diagnosis not present

## 2022-09-15 DIAGNOSIS — Z992 Dependence on renal dialysis: Secondary | ICD-10-CM | POA: Diagnosis not present

## 2022-09-15 DIAGNOSIS — D689 Coagulation defect, unspecified: Secondary | ICD-10-CM | POA: Diagnosis not present

## 2022-09-15 DIAGNOSIS — E1122 Type 2 diabetes mellitus with diabetic chronic kidney disease: Secondary | ICD-10-CM | POA: Diagnosis not present

## 2022-09-15 DIAGNOSIS — N2581 Secondary hyperparathyroidism of renal origin: Secondary | ICD-10-CM | POA: Diagnosis not present

## 2022-09-15 DIAGNOSIS — D631 Anemia in chronic kidney disease: Secondary | ICD-10-CM | POA: Diagnosis not present

## 2022-09-18 DIAGNOSIS — E1122 Type 2 diabetes mellitus with diabetic chronic kidney disease: Secondary | ICD-10-CM | POA: Diagnosis not present

## 2022-09-18 DIAGNOSIS — D631 Anemia in chronic kidney disease: Secondary | ICD-10-CM | POA: Diagnosis not present

## 2022-09-18 DIAGNOSIS — N186 End stage renal disease: Secondary | ICD-10-CM | POA: Diagnosis not present

## 2022-09-18 DIAGNOSIS — N2581 Secondary hyperparathyroidism of renal origin: Secondary | ICD-10-CM | POA: Diagnosis not present

## 2022-09-18 DIAGNOSIS — Z992 Dependence on renal dialysis: Secondary | ICD-10-CM | POA: Diagnosis not present

## 2022-09-18 DIAGNOSIS — D689 Coagulation defect, unspecified: Secondary | ICD-10-CM | POA: Diagnosis not present

## 2022-09-19 DIAGNOSIS — I2510 Atherosclerotic heart disease of native coronary artery without angina pectoris: Secondary | ICD-10-CM | POA: Diagnosis not present

## 2022-09-19 DIAGNOSIS — E1122 Type 2 diabetes mellitus with diabetic chronic kidney disease: Secondary | ICD-10-CM | POA: Diagnosis not present

## 2022-09-19 DIAGNOSIS — N186 End stage renal disease: Secondary | ICD-10-CM | POA: Diagnosis not present

## 2022-09-19 DIAGNOSIS — M109 Gout, unspecified: Secondary | ICD-10-CM | POA: Diagnosis not present

## 2022-09-19 DIAGNOSIS — Z1211 Encounter for screening for malignant neoplasm of colon: Secondary | ICD-10-CM | POA: Diagnosis not present

## 2022-09-19 DIAGNOSIS — E782 Mixed hyperlipidemia: Secondary | ICD-10-CM | POA: Diagnosis not present

## 2022-09-19 DIAGNOSIS — Z992 Dependence on renal dialysis: Secondary | ICD-10-CM | POA: Diagnosis not present

## 2022-09-19 DIAGNOSIS — D631 Anemia in chronic kidney disease: Secondary | ICD-10-CM | POA: Diagnosis not present

## 2022-09-19 DIAGNOSIS — D126 Benign neoplasm of colon, unspecified: Secondary | ICD-10-CM | POA: Diagnosis not present

## 2022-09-19 DIAGNOSIS — G4733 Obstructive sleep apnea (adult) (pediatric): Secondary | ICD-10-CM | POA: Diagnosis not present

## 2022-09-20 DIAGNOSIS — D631 Anemia in chronic kidney disease: Secondary | ICD-10-CM | POA: Diagnosis not present

## 2022-09-20 DIAGNOSIS — E1122 Type 2 diabetes mellitus with diabetic chronic kidney disease: Secondary | ICD-10-CM | POA: Diagnosis not present

## 2022-09-20 DIAGNOSIS — N186 End stage renal disease: Secondary | ICD-10-CM | POA: Diagnosis not present

## 2022-09-20 DIAGNOSIS — Z992 Dependence on renal dialysis: Secondary | ICD-10-CM | POA: Diagnosis not present

## 2022-09-20 DIAGNOSIS — N2581 Secondary hyperparathyroidism of renal origin: Secondary | ICD-10-CM | POA: Diagnosis not present

## 2022-09-20 DIAGNOSIS — D689 Coagulation defect, unspecified: Secondary | ICD-10-CM | POA: Diagnosis not present

## 2022-09-21 ENCOUNTER — Encounter: Payer: Medicare Other | Admitting: Cardiology

## 2022-09-21 NOTE — Progress Notes (Deleted)
Patient referred by Orpah Melter, MD for coronary artery disease  Subjective:   Robert Lucas, male    DOB: [birth year], 82 y.o.   MRN: [id number]   No chief complaint on file.  HPI  82 y.o. Caucasian male with with hypertension, hyperlipidemia, IDDM, CAD s/p anterior MI in 62s treated with TPA and PTCA, ESRD on HD, obesity, h/o gout, OSA, HFrEF  Sicne his last visit, he has undergone AV graft placement in left arm as the fistula did not mature. Currently, he continues dialysis through catheter in right chest. He denies chest pain, shortness of breath, palpitations, leg edema, orthopnea, PND, TIA/syncope. Blood pressure elevated toda,y but generally lower than this.   Reviewed recent test results with the patient, details below.    Current Outpatient Medications:    acetaminophen (TYLENOL) 650 MG CR tablet, Take 1,300 mg by mouth in the morning, at noon, and at bedtime., Disp: , Rfl:    allopurinol (ZYLOPRIM) 100 MG tablet, Take 100 mg by mouth in the morning., Disp: , Rfl:    aspirin EC 81 MG tablet, Take 81 mg by mouth daily with supper., Disp: , Rfl:    atorvastatin (LIPITOR) 40 MG tablet, Take 40 mg by mouth at bedtime., Disp: , Rfl:    CALCITRIOL PO, Take 2 capsules by mouth every Monday, Wednesday, and Friday with hemodialysis., Disp: , Rfl:    finasteride (PROSCAR) 5 MG tablet, Take 5 mg by mouth daily with supper., Disp: , Rfl:    gabapentin (NEURONTIN) 300 MG capsule, Take 1 capsule (300 mg total) by mouth at bedtime., Disp: 90 capsule, Rfl: 3   insulin aspart protamine - aspart (NOVOLOG MIX 70/30 FLEXPEN) (70-30) 100 UNIT/ML FlexPen, Inject 14-20 Units into the skin daily as needed (high blood sugar). If BS is <150=0 units, If BS is 150-200 units=14 units, If BS is >201=20 units, Disp: , Rfl:    IRON SUCROSE IV, Inject into the vein as needed. At dialysis, Disp: , Rfl:    Methoxy PEG-Epoetin Beta (MIRCERA IJ), Mircera, Disp: , Rfl:    metoprolol succinate (TOPROL-XL)  50 MG 24 hr tablet, Take 1 tablet (50 mg total) by mouth daily. Take with or immediately following a meal., Disp: 90 tablet, Rfl: 3   tamsulosin (FLOMAX) 0.4 MG CAPS capsule, Take 1 capsule (0.4 mg total) by mouth daily after supper. (Patient taking differently: Take 0.4 mg by mouth at bedtime.), Disp: 30 capsule, Rfl: 0   traMADol (ULTRAM) 50 MG tablet, Take 1 tablet (50 mg total) by mouth every 6 (six) hours as needed. (Patient not taking: Reported on 06/20/2022), Disp: 20 tablet, Rfl: 0    Cardiovascular studies:  Mobile cardiac telemetry 13 days 03/16/2022 - 03/30/2022: Dominant rhythm: Sinus. HR 51-113 bpm. Avg HR 75 bpm, in sinus rhythm. >4000 episodes of probable atrial tachycardia, fastest at 200 bpm, avg 128 bpm, longest for 46 secs. 8.6% isolated SVE, 4.0% couplets, 2.0% triplets. 815 episodes of VT, fastest at 226 bpm for 5 beats, longest for 18 beats at 174 bpm. 5.7% isolated VE, <1% couplet/triplets. No atrial fibrillation/atrial flutter/high grade AV block, sinus pause >3sec noted. 0 patient triggered events.    EKG 03/16/2022: Sinus rhythm 84 bpm Frequent PAC, PVC Old anterior infarct Nonspecific T-abnormality  Echocardiogram 01/24/2022:  Left ventricle cavity is normal in size. Mild concentric hypertrophy of  the left ventricle.  Mild global and severe apical hypokinesis. LVEF  45-50%. LVEF 45-50%. Doppler evidence of grade I (impaired) diastolic  dysfunction,  normal LAP.  Left atrial cavity is moderately dilated.  Trileaflet aortic valve with mild calcification. Trace aortic valve  stenosis. Vmax 1.9 m/sec, mean PG 8 mmHg, AVA 1.7 cm by continuity  equation. No regurgitation.  Mild calcification of the mitral valve annulus. Trace mitral  regurgitation. Trace tricuspid regurgitation.  Estimated pulmonary artery systolic pressure 25 Previous study on  11/15/2020, reported grade II diastolic dysfunction, estimated PASP 41  mmHg.   Echocardiogram 08/26/2021: 1. Left  ventricular ejection fraction, by estimation, is 30 to 35%. The  left ventricle has moderately decreased function. The left ventricle has  no regional wall motion abnormalities. The left ventricular internal  cavity size was mildly dilated. Left ventricular diastolic parameters are  consistent with Grade II diastolic dysfunction (pseudonormalization).   2. Right ventricular systolic function is normal. The right ventricular  size is normal. There is severely elevated pulmonary artery systolic  pressure. The estimated right ventricular systolic pressure is [id number] mmHg.   3. Left atrial size was moderately dilated.   4. The mitral valve is normal in structure. No evidence of mitral valve  regurgitation. No evidence of mitral stenosis.   5. Tricuspid valve regurgitation is mild to moderate.   6. The aortic valve is tricuspid. There is mild calcification of the  aortic valve. There is mild thickening of the aortic valve. Aortic valve  regurgitation is not visualized. Aortic valve sclerosis is present, with  no evidence of aortic valve stenosis.   7. The inferior vena cava is dilated in size with <50% respiratory  variability, suggesting right atrial pressure of 15 mmHg.   Cath 1990: Normal Left main, 99% stenosis proximal LAD, no significant disease CFX RCA, PTCA of LAD done   Recent labs: 06/30/2022: Glucose 291, BUN/Cr 13/2.0. EGFR NA. Na/K 134/3.7.  H/H 11/32. MCV 9. Platelets 158 HbA1C 8.0%  10/15/2021: Chol 108, TG 154, HDL 31, LDL 46  09/07/2021: Glucose 308, BUN/Cr 78/4.87. EGFR 11. Na/K 135/4.2. Albumin 3.0. Phos 6.1. Rest of the CMP normal H/H 8/24. MCV 94. Platelets 156 HbA1C 6.3%   Review of Systems  Cardiovascular:  Negative for chest pain, dyspnea on exertion, leg swelling, palpitations and syncope.  Musculoskeletal:  Positive for back pain (Radiating to left leg.  Also has left leg weakness.).        There were no vitals filed for this visit.    There is no height  or weight on file to calculate BMI. There were no vitals filed for this visit.    Objective:   Physical Exam Vitals and nursing note reviewed.  Constitutional:      Appearance: He is well-developed.  Neck:     Vascular: No JVD.  Cardiovascular:     Rate and Rhythm: Normal rate and regular rhythm.     Pulses: Intact distal pulses.     Heart sounds: Normal heart sounds. No murmur heard. Pulmonary:     Effort: Pulmonary effort is normal.     Breath sounds: Normal breath sounds. No wheezing or rales.  Musculoskeletal:     Right lower leg: No edema.     Left lower leg: No edema.           Assessment & Recommendations:   82 y.o. Caucasian male with with hypertension, hyperlipidemia, IDDM, CAD s/p anterior MI in 36s treated with TPA and PTCA, ESRD on HD, obesity, h/o gout, OSA, HFrEF  *** Arhythmia: Numerous episodes of ectopy, atrial as well as nonsustained ventricular tachycardia episodes. No Afib.  Known  cardiomyopathy at baseline, although with now recovered LVEF to 45-50%. He is asymptomatic at this time. Increase metoprolol succinate to 50 mg daily.  In future, could consider adding amiodarone.   HFrEF: EF 45-50% (12/2021) with mild global and severe apical hypokinesis. Clinically euvolemic. Continue metoprolol succinate and hydralazine.  Coronary artery disease involving native coronary artery of native heart without angina pectoris No angina symptoms. Continue Aspirin, statin, metoprolol In absence of angina or dyspnea, ischemic workup is not necessary.   F/u in ***3 months   Nigel Mormon, MD Pager: [phone] Office: [phone]

## 2022-09-25 DIAGNOSIS — Z992 Dependence on renal dialysis: Secondary | ICD-10-CM | POA: Diagnosis not present

## 2022-09-25 DIAGNOSIS — D631 Anemia in chronic kidney disease: Secondary | ICD-10-CM | POA: Diagnosis not present

## 2022-09-25 DIAGNOSIS — N2581 Secondary hyperparathyroidism of renal origin: Secondary | ICD-10-CM | POA: Diagnosis not present

## 2022-09-25 DIAGNOSIS — N186 End stage renal disease: Secondary | ICD-10-CM | POA: Diagnosis not present

## 2022-09-25 DIAGNOSIS — E1122 Type 2 diabetes mellitus with diabetic chronic kidney disease: Secondary | ICD-10-CM | POA: Diagnosis not present

## 2022-09-25 DIAGNOSIS — D689 Coagulation defect, unspecified: Secondary | ICD-10-CM | POA: Diagnosis not present

## 2022-09-27 DIAGNOSIS — D631 Anemia in chronic kidney disease: Secondary | ICD-10-CM | POA: Diagnosis not present

## 2022-09-27 DIAGNOSIS — D689 Coagulation defect, unspecified: Secondary | ICD-10-CM | POA: Diagnosis not present

## 2022-09-27 DIAGNOSIS — N186 End stage renal disease: Secondary | ICD-10-CM | POA: Diagnosis not present

## 2022-09-27 DIAGNOSIS — N2581 Secondary hyperparathyroidism of renal origin: Secondary | ICD-10-CM | POA: Diagnosis not present

## 2022-09-27 DIAGNOSIS — Z992 Dependence on renal dialysis: Secondary | ICD-10-CM | POA: Diagnosis not present

## 2022-09-27 DIAGNOSIS — E1122 Type 2 diabetes mellitus with diabetic chronic kidney disease: Secondary | ICD-10-CM | POA: Diagnosis not present

## 2022-09-29 DIAGNOSIS — D689 Coagulation defect, unspecified: Secondary | ICD-10-CM | POA: Diagnosis not present

## 2022-09-29 DIAGNOSIS — N186 End stage renal disease: Secondary | ICD-10-CM | POA: Diagnosis not present

## 2022-09-29 DIAGNOSIS — D631 Anemia in chronic kidney disease: Secondary | ICD-10-CM | POA: Diagnosis not present

## 2022-09-29 DIAGNOSIS — N2581 Secondary hyperparathyroidism of renal origin: Secondary | ICD-10-CM | POA: Diagnosis not present

## 2022-09-29 DIAGNOSIS — E1122 Type 2 diabetes mellitus with diabetic chronic kidney disease: Secondary | ICD-10-CM | POA: Diagnosis not present

## 2022-09-29 DIAGNOSIS — Z992 Dependence on renal dialysis: Secondary | ICD-10-CM | POA: Diagnosis not present

## 2022-10-01 DIAGNOSIS — E1129 Type 2 diabetes mellitus with other diabetic kidney complication: Secondary | ICD-10-CM | POA: Diagnosis not present

## 2022-10-01 DIAGNOSIS — Z992 Dependence on renal dialysis: Secondary | ICD-10-CM | POA: Diagnosis not present

## 2022-10-01 DIAGNOSIS — N186 End stage renal disease: Secondary | ICD-10-CM | POA: Diagnosis not present

## 2022-10-02 DIAGNOSIS — E1122 Type 2 diabetes mellitus with diabetic chronic kidney disease: Secondary | ICD-10-CM | POA: Diagnosis not present

## 2022-10-02 DIAGNOSIS — Z992 Dependence on renal dialysis: Secondary | ICD-10-CM | POA: Diagnosis not present

## 2022-10-02 DIAGNOSIS — N2581 Secondary hyperparathyroidism of renal origin: Secondary | ICD-10-CM | POA: Diagnosis not present

## 2022-10-02 DIAGNOSIS — Z1211 Encounter for screening for malignant neoplasm of colon: Secondary | ICD-10-CM | POA: Diagnosis not present

## 2022-10-02 DIAGNOSIS — D631 Anemia in chronic kidney disease: Secondary | ICD-10-CM | POA: Diagnosis not present

## 2022-10-02 DIAGNOSIS — D689 Coagulation defect, unspecified: Secondary | ICD-10-CM | POA: Diagnosis not present

## 2022-10-02 DIAGNOSIS — N186 End stage renal disease: Secondary | ICD-10-CM | POA: Diagnosis not present

## 2022-10-04 DIAGNOSIS — E1122 Type 2 diabetes mellitus with diabetic chronic kidney disease: Secondary | ICD-10-CM | POA: Diagnosis not present

## 2022-10-04 DIAGNOSIS — D631 Anemia in chronic kidney disease: Secondary | ICD-10-CM | POA: Diagnosis not present

## 2022-10-04 DIAGNOSIS — N186 End stage renal disease: Secondary | ICD-10-CM | POA: Diagnosis not present

## 2022-10-04 DIAGNOSIS — Z992 Dependence on renal dialysis: Secondary | ICD-10-CM | POA: Diagnosis not present

## 2022-10-04 DIAGNOSIS — N2581 Secondary hyperparathyroidism of renal origin: Secondary | ICD-10-CM | POA: Diagnosis not present

## 2022-10-04 DIAGNOSIS — D689 Coagulation defect, unspecified: Secondary | ICD-10-CM | POA: Diagnosis not present

## 2022-10-06 DIAGNOSIS — N186 End stage renal disease: Secondary | ICD-10-CM | POA: Diagnosis not present

## 2022-10-06 DIAGNOSIS — E1122 Type 2 diabetes mellitus with diabetic chronic kidney disease: Secondary | ICD-10-CM | POA: Diagnosis not present

## 2022-10-06 DIAGNOSIS — D689 Coagulation defect, unspecified: Secondary | ICD-10-CM | POA: Diagnosis not present

## 2022-10-06 DIAGNOSIS — N2581 Secondary hyperparathyroidism of renal origin: Secondary | ICD-10-CM | POA: Diagnosis not present

## 2022-10-06 DIAGNOSIS — D631 Anemia in chronic kidney disease: Secondary | ICD-10-CM | POA: Diagnosis not present

## 2022-10-06 DIAGNOSIS — Z992 Dependence on renal dialysis: Secondary | ICD-10-CM | POA: Diagnosis not present

## 2022-10-09 DIAGNOSIS — D689 Coagulation defect, unspecified: Secondary | ICD-10-CM | POA: Diagnosis not present

## 2022-10-09 DIAGNOSIS — N186 End stage renal disease: Secondary | ICD-10-CM | POA: Diagnosis not present

## 2022-10-09 DIAGNOSIS — D631 Anemia in chronic kidney disease: Secondary | ICD-10-CM | POA: Diagnosis not present

## 2022-10-09 DIAGNOSIS — E1122 Type 2 diabetes mellitus with diabetic chronic kidney disease: Secondary | ICD-10-CM | POA: Diagnosis not present

## 2022-10-09 DIAGNOSIS — Z992 Dependence on renal dialysis: Secondary | ICD-10-CM | POA: Diagnosis not present

## 2022-10-09 DIAGNOSIS — N2581 Secondary hyperparathyroidism of renal origin: Secondary | ICD-10-CM | POA: Diagnosis not present

## 2022-10-11 DIAGNOSIS — D631 Anemia in chronic kidney disease: Secondary | ICD-10-CM | POA: Diagnosis not present

## 2022-10-11 DIAGNOSIS — N2581 Secondary hyperparathyroidism of renal origin: Secondary | ICD-10-CM | POA: Diagnosis not present

## 2022-10-11 DIAGNOSIS — N186 End stage renal disease: Secondary | ICD-10-CM | POA: Diagnosis not present

## 2022-10-11 DIAGNOSIS — D689 Coagulation defect, unspecified: Secondary | ICD-10-CM | POA: Diagnosis not present

## 2022-10-11 DIAGNOSIS — E1122 Type 2 diabetes mellitus with diabetic chronic kidney disease: Secondary | ICD-10-CM | POA: Diagnosis not present

## 2022-10-11 DIAGNOSIS — Z992 Dependence on renal dialysis: Secondary | ICD-10-CM | POA: Diagnosis not present

## 2022-10-13 DIAGNOSIS — D631 Anemia in chronic kidney disease: Secondary | ICD-10-CM | POA: Diagnosis not present

## 2022-10-13 DIAGNOSIS — N186 End stage renal disease: Secondary | ICD-10-CM | POA: Diagnosis not present

## 2022-10-13 DIAGNOSIS — E1122 Type 2 diabetes mellitus with diabetic chronic kidney disease: Secondary | ICD-10-CM | POA: Diagnosis not present

## 2022-10-13 DIAGNOSIS — N2581 Secondary hyperparathyroidism of renal origin: Secondary | ICD-10-CM | POA: Diagnosis not present

## 2022-10-13 DIAGNOSIS — Z992 Dependence on renal dialysis: Secondary | ICD-10-CM | POA: Diagnosis not present

## 2022-10-13 DIAGNOSIS — D689 Coagulation defect, unspecified: Secondary | ICD-10-CM | POA: Diagnosis not present

## 2022-10-16 DIAGNOSIS — D631 Anemia in chronic kidney disease: Secondary | ICD-10-CM | POA: Diagnosis not present

## 2022-10-16 DIAGNOSIS — Z992 Dependence on renal dialysis: Secondary | ICD-10-CM | POA: Diagnosis not present

## 2022-10-16 DIAGNOSIS — D689 Coagulation defect, unspecified: Secondary | ICD-10-CM | POA: Diagnosis not present

## 2022-10-16 DIAGNOSIS — N186 End stage renal disease: Secondary | ICD-10-CM | POA: Diagnosis not present

## 2022-10-16 DIAGNOSIS — N2581 Secondary hyperparathyroidism of renal origin: Secondary | ICD-10-CM | POA: Diagnosis not present

## 2022-10-16 DIAGNOSIS — E1122 Type 2 diabetes mellitus with diabetic chronic kidney disease: Secondary | ICD-10-CM | POA: Diagnosis not present

## 2022-10-18 DIAGNOSIS — E1122 Type 2 diabetes mellitus with diabetic chronic kidney disease: Secondary | ICD-10-CM | POA: Diagnosis not present

## 2022-10-18 DIAGNOSIS — D689 Coagulation defect, unspecified: Secondary | ICD-10-CM | POA: Diagnosis not present

## 2022-10-18 DIAGNOSIS — Z992 Dependence on renal dialysis: Secondary | ICD-10-CM | POA: Diagnosis not present

## 2022-10-18 DIAGNOSIS — N2581 Secondary hyperparathyroidism of renal origin: Secondary | ICD-10-CM | POA: Diagnosis not present

## 2022-10-18 DIAGNOSIS — D631 Anemia in chronic kidney disease: Secondary | ICD-10-CM | POA: Diagnosis not present

## 2022-10-18 DIAGNOSIS — N186 End stage renal disease: Secondary | ICD-10-CM | POA: Diagnosis not present

## 2022-10-19 DIAGNOSIS — E8770 Fluid overload, unspecified: Secondary | ICD-10-CM | POA: Diagnosis not present

## 2022-10-19 DIAGNOSIS — N2581 Secondary hyperparathyroidism of renal origin: Secondary | ICD-10-CM | POA: Diagnosis not present

## 2022-10-19 DIAGNOSIS — Z992 Dependence on renal dialysis: Secondary | ICD-10-CM | POA: Diagnosis not present

## 2022-10-19 DIAGNOSIS — N186 End stage renal disease: Secondary | ICD-10-CM | POA: Diagnosis not present

## 2022-10-20 DIAGNOSIS — Z992 Dependence on renal dialysis: Secondary | ICD-10-CM | POA: Diagnosis not present

## 2022-10-20 DIAGNOSIS — N2581 Secondary hyperparathyroidism of renal origin: Secondary | ICD-10-CM | POA: Diagnosis not present

## 2022-10-20 DIAGNOSIS — D631 Anemia in chronic kidney disease: Secondary | ICD-10-CM | POA: Diagnosis not present

## 2022-10-20 DIAGNOSIS — E1122 Type 2 diabetes mellitus with diabetic chronic kidney disease: Secondary | ICD-10-CM | POA: Diagnosis not present

## 2022-10-20 DIAGNOSIS — N186 End stage renal disease: Secondary | ICD-10-CM | POA: Diagnosis not present

## 2022-10-20 DIAGNOSIS — D689 Coagulation defect, unspecified: Secondary | ICD-10-CM | POA: Diagnosis not present

## 2022-10-23 DIAGNOSIS — N2581 Secondary hyperparathyroidism of renal origin: Secondary | ICD-10-CM | POA: Diagnosis not present

## 2022-10-23 DIAGNOSIS — D689 Coagulation defect, unspecified: Secondary | ICD-10-CM | POA: Diagnosis not present

## 2022-10-23 DIAGNOSIS — D631 Anemia in chronic kidney disease: Secondary | ICD-10-CM | POA: Diagnosis not present

## 2022-10-23 DIAGNOSIS — N186 End stage renal disease: Secondary | ICD-10-CM | POA: Diagnosis not present

## 2022-10-23 DIAGNOSIS — E1122 Type 2 diabetes mellitus with diabetic chronic kidney disease: Secondary | ICD-10-CM | POA: Diagnosis not present

## 2022-10-23 DIAGNOSIS — Z992 Dependence on renal dialysis: Secondary | ICD-10-CM | POA: Diagnosis not present

## 2022-10-25 DIAGNOSIS — E1122 Type 2 diabetes mellitus with diabetic chronic kidney disease: Secondary | ICD-10-CM | POA: Diagnosis not present

## 2022-10-25 DIAGNOSIS — D689 Coagulation defect, unspecified: Secondary | ICD-10-CM | POA: Diagnosis not present

## 2022-10-25 DIAGNOSIS — D631 Anemia in chronic kidney disease: Secondary | ICD-10-CM | POA: Diagnosis not present

## 2022-10-25 DIAGNOSIS — N2581 Secondary hyperparathyroidism of renal origin: Secondary | ICD-10-CM | POA: Diagnosis not present

## 2022-10-25 DIAGNOSIS — N186 End stage renal disease: Secondary | ICD-10-CM | POA: Diagnosis not present

## 2022-10-25 DIAGNOSIS — Z992 Dependence on renal dialysis: Secondary | ICD-10-CM | POA: Diagnosis not present

## 2022-10-27 DIAGNOSIS — D631 Anemia in chronic kidney disease: Secondary | ICD-10-CM | POA: Diagnosis not present

## 2022-10-27 DIAGNOSIS — N186 End stage renal disease: Secondary | ICD-10-CM | POA: Diagnosis not present

## 2022-10-27 DIAGNOSIS — N2581 Secondary hyperparathyroidism of renal origin: Secondary | ICD-10-CM | POA: Diagnosis not present

## 2022-10-27 DIAGNOSIS — D689 Coagulation defect, unspecified: Secondary | ICD-10-CM | POA: Diagnosis not present

## 2022-10-27 DIAGNOSIS — E1122 Type 2 diabetes mellitus with diabetic chronic kidney disease: Secondary | ICD-10-CM | POA: Diagnosis not present

## 2022-10-27 DIAGNOSIS — Z992 Dependence on renal dialysis: Secondary | ICD-10-CM | POA: Diagnosis not present

## 2022-10-30 DIAGNOSIS — D631 Anemia in chronic kidney disease: Secondary | ICD-10-CM | POA: Diagnosis not present

## 2022-10-30 DIAGNOSIS — N2581 Secondary hyperparathyroidism of renal origin: Secondary | ICD-10-CM | POA: Diagnosis not present

## 2022-10-30 DIAGNOSIS — D689 Coagulation defect, unspecified: Secondary | ICD-10-CM | POA: Diagnosis not present

## 2022-10-30 DIAGNOSIS — N186 End stage renal disease: Secondary | ICD-10-CM | POA: Diagnosis not present

## 2022-10-30 DIAGNOSIS — Z992 Dependence on renal dialysis: Secondary | ICD-10-CM | POA: Diagnosis not present

## 2022-10-30 DIAGNOSIS — E1122 Type 2 diabetes mellitus with diabetic chronic kidney disease: Secondary | ICD-10-CM | POA: Diagnosis not present

## 2022-10-31 DIAGNOSIS — E1129 Type 2 diabetes mellitus with other diabetic kidney complication: Secondary | ICD-10-CM | POA: Diagnosis not present

## 2022-10-31 DIAGNOSIS — Z992 Dependence on renal dialysis: Secondary | ICD-10-CM | POA: Diagnosis not present

## 2022-10-31 DIAGNOSIS — N186 End stage renal disease: Secondary | ICD-10-CM | POA: Diagnosis not present

## 2022-11-01 DIAGNOSIS — D689 Coagulation defect, unspecified: Secondary | ICD-10-CM | POA: Diagnosis not present

## 2022-11-01 DIAGNOSIS — Z992 Dependence on renal dialysis: Secondary | ICD-10-CM | POA: Diagnosis not present

## 2022-11-01 DIAGNOSIS — N2581 Secondary hyperparathyroidism of renal origin: Secondary | ICD-10-CM | POA: Diagnosis not present

## 2022-11-01 DIAGNOSIS — D631 Anemia in chronic kidney disease: Secondary | ICD-10-CM | POA: Diagnosis not present

## 2022-11-01 DIAGNOSIS — N186 End stage renal disease: Secondary | ICD-10-CM | POA: Diagnosis not present

## 2022-11-03 DIAGNOSIS — N2581 Secondary hyperparathyroidism of renal origin: Secondary | ICD-10-CM | POA: Diagnosis not present

## 2022-11-03 DIAGNOSIS — Z992 Dependence on renal dialysis: Secondary | ICD-10-CM | POA: Diagnosis not present

## 2022-11-03 DIAGNOSIS — N186 End stage renal disease: Secondary | ICD-10-CM | POA: Diagnosis not present

## 2022-11-03 DIAGNOSIS — D631 Anemia in chronic kidney disease: Secondary | ICD-10-CM | POA: Diagnosis not present

## 2022-11-03 DIAGNOSIS — D689 Coagulation defect, unspecified: Secondary | ICD-10-CM | POA: Diagnosis not present

## 2022-11-06 DIAGNOSIS — Z992 Dependence on renal dialysis: Secondary | ICD-10-CM | POA: Diagnosis not present

## 2022-11-06 DIAGNOSIS — I871 Compression of vein: Secondary | ICD-10-CM | POA: Diagnosis not present

## 2022-11-06 DIAGNOSIS — N2581 Secondary hyperparathyroidism of renal origin: Secondary | ICD-10-CM | POA: Diagnosis not present

## 2022-11-06 DIAGNOSIS — D689 Coagulation defect, unspecified: Secondary | ICD-10-CM | POA: Diagnosis not present

## 2022-11-06 DIAGNOSIS — D631 Anemia in chronic kidney disease: Secondary | ICD-10-CM | POA: Diagnosis not present

## 2022-11-06 DIAGNOSIS — N186 End stage renal disease: Secondary | ICD-10-CM | POA: Diagnosis not present

## 2022-11-06 DIAGNOSIS — T82858A Stenosis of vascular prosthetic devices, implants and grafts, initial encounter: Secondary | ICD-10-CM | POA: Diagnosis not present

## 2022-11-08 DIAGNOSIS — D689 Coagulation defect, unspecified: Secondary | ICD-10-CM | POA: Diagnosis not present

## 2022-11-08 DIAGNOSIS — Z992 Dependence on renal dialysis: Secondary | ICD-10-CM | POA: Diagnosis not present

## 2022-11-08 DIAGNOSIS — D631 Anemia in chronic kidney disease: Secondary | ICD-10-CM | POA: Diagnosis not present

## 2022-11-08 DIAGNOSIS — N186 End stage renal disease: Secondary | ICD-10-CM | POA: Diagnosis not present

## 2022-11-08 DIAGNOSIS — N2581 Secondary hyperparathyroidism of renal origin: Secondary | ICD-10-CM | POA: Diagnosis not present

## 2022-11-10 DIAGNOSIS — Z992 Dependence on renal dialysis: Secondary | ICD-10-CM | POA: Diagnosis not present

## 2022-11-10 DIAGNOSIS — N2581 Secondary hyperparathyroidism of renal origin: Secondary | ICD-10-CM | POA: Diagnosis not present

## 2022-11-10 DIAGNOSIS — D631 Anemia in chronic kidney disease: Secondary | ICD-10-CM | POA: Diagnosis not present

## 2022-11-10 DIAGNOSIS — D689 Coagulation defect, unspecified: Secondary | ICD-10-CM | POA: Diagnosis not present

## 2022-11-10 DIAGNOSIS — N186 End stage renal disease: Secondary | ICD-10-CM | POA: Diagnosis not present

## 2022-11-13 DIAGNOSIS — N186 End stage renal disease: Secondary | ICD-10-CM | POA: Diagnosis not present

## 2022-11-13 DIAGNOSIS — N2581 Secondary hyperparathyroidism of renal origin: Secondary | ICD-10-CM | POA: Diagnosis not present

## 2022-11-13 DIAGNOSIS — D689 Coagulation defect, unspecified: Secondary | ICD-10-CM | POA: Diagnosis not present

## 2022-11-13 DIAGNOSIS — Z992 Dependence on renal dialysis: Secondary | ICD-10-CM | POA: Diagnosis not present

## 2022-11-13 DIAGNOSIS — D631 Anemia in chronic kidney disease: Secondary | ICD-10-CM | POA: Diagnosis not present

## 2022-11-15 DIAGNOSIS — N186 End stage renal disease: Secondary | ICD-10-CM | POA: Diagnosis not present

## 2022-11-15 DIAGNOSIS — D631 Anemia in chronic kidney disease: Secondary | ICD-10-CM | POA: Diagnosis not present

## 2022-11-15 DIAGNOSIS — D689 Coagulation defect, unspecified: Secondary | ICD-10-CM | POA: Diagnosis not present

## 2022-11-15 DIAGNOSIS — N2581 Secondary hyperparathyroidism of renal origin: Secondary | ICD-10-CM | POA: Diagnosis not present

## 2022-11-15 DIAGNOSIS — Z992 Dependence on renal dialysis: Secondary | ICD-10-CM | POA: Diagnosis not present

## 2022-11-16 DIAGNOSIS — M109 Gout, unspecified: Secondary | ICD-10-CM | POA: Diagnosis not present

## 2022-11-16 DIAGNOSIS — E1122 Type 2 diabetes mellitus with diabetic chronic kidney disease: Secondary | ICD-10-CM | POA: Diagnosis not present

## 2022-11-16 DIAGNOSIS — N184 Chronic kidney disease, stage 4 (severe): Secondary | ICD-10-CM | POA: Diagnosis not present

## 2022-11-16 DIAGNOSIS — E782 Mixed hyperlipidemia: Secondary | ICD-10-CM | POA: Diagnosis not present

## 2022-11-16 DIAGNOSIS — D631 Anemia in chronic kidney disease: Secondary | ICD-10-CM | POA: Diagnosis not present

## 2022-11-17 DIAGNOSIS — Z992 Dependence on renal dialysis: Secondary | ICD-10-CM | POA: Diagnosis not present

## 2022-11-17 DIAGNOSIS — N186 End stage renal disease: Secondary | ICD-10-CM | POA: Diagnosis not present

## 2022-11-17 DIAGNOSIS — D631 Anemia in chronic kidney disease: Secondary | ICD-10-CM | POA: Diagnosis not present

## 2022-11-17 DIAGNOSIS — D689 Coagulation defect, unspecified: Secondary | ICD-10-CM | POA: Diagnosis not present

## 2022-11-17 DIAGNOSIS — N2581 Secondary hyperparathyroidism of renal origin: Secondary | ICD-10-CM | POA: Diagnosis not present

## 2022-11-20 DIAGNOSIS — N2581 Secondary hyperparathyroidism of renal origin: Secondary | ICD-10-CM | POA: Diagnosis not present

## 2022-11-20 DIAGNOSIS — D631 Anemia in chronic kidney disease: Secondary | ICD-10-CM | POA: Diagnosis not present

## 2022-11-20 DIAGNOSIS — Z992 Dependence on renal dialysis: Secondary | ICD-10-CM | POA: Diagnosis not present

## 2022-11-20 DIAGNOSIS — N186 End stage renal disease: Secondary | ICD-10-CM | POA: Diagnosis not present

## 2022-11-20 DIAGNOSIS — D689 Coagulation defect, unspecified: Secondary | ICD-10-CM | POA: Diagnosis not present

## 2022-11-21 DIAGNOSIS — Z23 Encounter for immunization: Secondary | ICD-10-CM | POA: Diagnosis not present

## 2022-11-21 DIAGNOSIS — Z794 Long term (current) use of insulin: Secondary | ICD-10-CM | POA: Diagnosis not present

## 2022-11-21 DIAGNOSIS — I120 Hypertensive chronic kidney disease with stage 5 chronic kidney disease or end stage renal disease: Secondary | ICD-10-CM | POA: Diagnosis not present

## 2022-11-21 DIAGNOSIS — Z992 Dependence on renal dialysis: Secondary | ICD-10-CM | POA: Diagnosis not present

## 2022-11-21 DIAGNOSIS — D631 Anemia in chronic kidney disease: Secondary | ICD-10-CM | POA: Diagnosis not present

## 2022-11-21 DIAGNOSIS — E782 Mixed hyperlipidemia: Secondary | ICD-10-CM | POA: Diagnosis not present

## 2022-11-21 DIAGNOSIS — D126 Benign neoplasm of colon, unspecified: Secondary | ICD-10-CM | POA: Diagnosis not present

## 2022-11-21 DIAGNOSIS — E1122 Type 2 diabetes mellitus with diabetic chronic kidney disease: Secondary | ICD-10-CM | POA: Diagnosis not present

## 2022-11-21 DIAGNOSIS — N186 End stage renal disease: Secondary | ICD-10-CM | POA: Diagnosis not present

## 2022-11-22 DIAGNOSIS — N186 End stage renal disease: Secondary | ICD-10-CM | POA: Diagnosis not present

## 2022-11-22 DIAGNOSIS — Z992 Dependence on renal dialysis: Secondary | ICD-10-CM | POA: Diagnosis not present

## 2022-11-22 DIAGNOSIS — N2581 Secondary hyperparathyroidism of renal origin: Secondary | ICD-10-CM | POA: Diagnosis not present

## 2022-11-22 DIAGNOSIS — D689 Coagulation defect, unspecified: Secondary | ICD-10-CM | POA: Diagnosis not present

## 2022-11-22 DIAGNOSIS — D631 Anemia in chronic kidney disease: Secondary | ICD-10-CM | POA: Diagnosis not present

## 2022-11-24 DIAGNOSIS — D689 Coagulation defect, unspecified: Secondary | ICD-10-CM | POA: Diagnosis not present

## 2022-11-24 DIAGNOSIS — N186 End stage renal disease: Secondary | ICD-10-CM | POA: Diagnosis not present

## 2022-11-24 DIAGNOSIS — Z992 Dependence on renal dialysis: Secondary | ICD-10-CM | POA: Diagnosis not present

## 2022-11-24 DIAGNOSIS — D631 Anemia in chronic kidney disease: Secondary | ICD-10-CM | POA: Diagnosis not present

## 2022-11-24 DIAGNOSIS — N2581 Secondary hyperparathyroidism of renal origin: Secondary | ICD-10-CM | POA: Diagnosis not present

## 2022-11-27 DIAGNOSIS — Z992 Dependence on renal dialysis: Secondary | ICD-10-CM | POA: Diagnosis not present

## 2022-11-27 DIAGNOSIS — D689 Coagulation defect, unspecified: Secondary | ICD-10-CM | POA: Diagnosis not present

## 2022-11-27 DIAGNOSIS — N186 End stage renal disease: Secondary | ICD-10-CM | POA: Diagnosis not present

## 2022-11-27 DIAGNOSIS — N2581 Secondary hyperparathyroidism of renal origin: Secondary | ICD-10-CM | POA: Diagnosis not present

## 2022-11-27 DIAGNOSIS — D631 Anemia in chronic kidney disease: Secondary | ICD-10-CM | POA: Diagnosis not present

## 2022-11-29 DIAGNOSIS — D631 Anemia in chronic kidney disease: Secondary | ICD-10-CM | POA: Diagnosis not present

## 2022-11-29 DIAGNOSIS — N2581 Secondary hyperparathyroidism of renal origin: Secondary | ICD-10-CM | POA: Diagnosis not present

## 2022-11-29 DIAGNOSIS — D689 Coagulation defect, unspecified: Secondary | ICD-10-CM | POA: Diagnosis not present

## 2022-11-29 DIAGNOSIS — N186 End stage renal disease: Secondary | ICD-10-CM | POA: Diagnosis not present

## 2022-11-29 DIAGNOSIS — Z992 Dependence on renal dialysis: Secondary | ICD-10-CM | POA: Diagnosis not present

## 2022-12-01 DIAGNOSIS — N2581 Secondary hyperparathyroidism of renal origin: Secondary | ICD-10-CM | POA: Diagnosis not present

## 2022-12-01 DIAGNOSIS — D631 Anemia in chronic kidney disease: Secondary | ICD-10-CM | POA: Diagnosis not present

## 2022-12-01 DIAGNOSIS — N186 End stage renal disease: Secondary | ICD-10-CM | POA: Diagnosis not present

## 2022-12-01 DIAGNOSIS — R3914 Feeling of incomplete bladder emptying: Secondary | ICD-10-CM | POA: Diagnosis not present

## 2022-12-01 DIAGNOSIS — Z992 Dependence on renal dialysis: Secondary | ICD-10-CM | POA: Diagnosis not present

## 2022-12-01 DIAGNOSIS — E1129 Type 2 diabetes mellitus with other diabetic kidney complication: Secondary | ICD-10-CM | POA: Diagnosis not present

## 2022-12-01 DIAGNOSIS — D689 Coagulation defect, unspecified: Secondary | ICD-10-CM | POA: Diagnosis not present

## 2022-12-04 DIAGNOSIS — N2581 Secondary hyperparathyroidism of renal origin: Secondary | ICD-10-CM | POA: Diagnosis not present

## 2022-12-04 DIAGNOSIS — N186 End stage renal disease: Secondary | ICD-10-CM | POA: Diagnosis not present

## 2022-12-04 DIAGNOSIS — D689 Coagulation defect, unspecified: Secondary | ICD-10-CM | POA: Diagnosis not present

## 2022-12-04 DIAGNOSIS — D631 Anemia in chronic kidney disease: Secondary | ICD-10-CM | POA: Diagnosis not present

## 2022-12-04 DIAGNOSIS — Z992 Dependence on renal dialysis: Secondary | ICD-10-CM | POA: Diagnosis not present

## 2022-12-06 DIAGNOSIS — D689 Coagulation defect, unspecified: Secondary | ICD-10-CM | POA: Diagnosis not present

## 2022-12-06 DIAGNOSIS — N2581 Secondary hyperparathyroidism of renal origin: Secondary | ICD-10-CM | POA: Diagnosis not present

## 2022-12-06 DIAGNOSIS — N186 End stage renal disease: Secondary | ICD-10-CM | POA: Diagnosis not present

## 2022-12-06 DIAGNOSIS — Z992 Dependence on renal dialysis: Secondary | ICD-10-CM | POA: Diagnosis not present

## 2022-12-06 DIAGNOSIS — D631 Anemia in chronic kidney disease: Secondary | ICD-10-CM | POA: Diagnosis not present

## 2022-12-08 DIAGNOSIS — Z992 Dependence on renal dialysis: Secondary | ICD-10-CM | POA: Diagnosis not present

## 2022-12-08 DIAGNOSIS — N186 End stage renal disease: Secondary | ICD-10-CM | POA: Diagnosis not present

## 2022-12-08 DIAGNOSIS — N2581 Secondary hyperparathyroidism of renal origin: Secondary | ICD-10-CM | POA: Diagnosis not present

## 2022-12-08 DIAGNOSIS — D689 Coagulation defect, unspecified: Secondary | ICD-10-CM | POA: Diagnosis not present

## 2022-12-08 DIAGNOSIS — D631 Anemia in chronic kidney disease: Secondary | ICD-10-CM | POA: Diagnosis not present

## 2022-12-11 DIAGNOSIS — D631 Anemia in chronic kidney disease: Secondary | ICD-10-CM | POA: Diagnosis not present

## 2022-12-11 DIAGNOSIS — Z992 Dependence on renal dialysis: Secondary | ICD-10-CM | POA: Diagnosis not present

## 2022-12-11 DIAGNOSIS — D689 Coagulation defect, unspecified: Secondary | ICD-10-CM | POA: Diagnosis not present

## 2022-12-11 DIAGNOSIS — N186 End stage renal disease: Secondary | ICD-10-CM | POA: Diagnosis not present

## 2022-12-11 DIAGNOSIS — N2581 Secondary hyperparathyroidism of renal origin: Secondary | ICD-10-CM | POA: Diagnosis not present

## 2022-12-13 DIAGNOSIS — D631 Anemia in chronic kidney disease: Secondary | ICD-10-CM | POA: Diagnosis not present

## 2022-12-13 DIAGNOSIS — Z992 Dependence on renal dialysis: Secondary | ICD-10-CM | POA: Diagnosis not present

## 2022-12-13 DIAGNOSIS — N186 End stage renal disease: Secondary | ICD-10-CM | POA: Diagnosis not present

## 2022-12-13 DIAGNOSIS — D689 Coagulation defect, unspecified: Secondary | ICD-10-CM | POA: Diagnosis not present

## 2022-12-13 DIAGNOSIS — N2581 Secondary hyperparathyroidism of renal origin: Secondary | ICD-10-CM | POA: Diagnosis not present

## 2022-12-14 DIAGNOSIS — R194 Change in bowel habit: Secondary | ICD-10-CM | POA: Diagnosis not present

## 2022-12-14 DIAGNOSIS — K5730 Diverticulosis of large intestine without perforation or abscess without bleeding: Secondary | ICD-10-CM | POA: Diagnosis not present

## 2022-12-14 DIAGNOSIS — K5289 Other specified noninfective gastroenteritis and colitis: Secondary | ICD-10-CM | POA: Diagnosis not present

## 2022-12-14 DIAGNOSIS — K626 Ulcer of anus and rectum: Secondary | ICD-10-CM | POA: Diagnosis not present

## 2022-12-14 DIAGNOSIS — D125 Benign neoplasm of sigmoid colon: Secondary | ICD-10-CM | POA: Diagnosis not present

## 2022-12-15 DIAGNOSIS — N2581 Secondary hyperparathyroidism of renal origin: Secondary | ICD-10-CM | POA: Diagnosis not present

## 2022-12-15 DIAGNOSIS — Z992 Dependence on renal dialysis: Secondary | ICD-10-CM | POA: Diagnosis not present

## 2022-12-15 DIAGNOSIS — N186 End stage renal disease: Secondary | ICD-10-CM | POA: Diagnosis not present

## 2022-12-15 DIAGNOSIS — D631 Anemia in chronic kidney disease: Secondary | ICD-10-CM | POA: Diagnosis not present

## 2022-12-15 DIAGNOSIS — D689 Coagulation defect, unspecified: Secondary | ICD-10-CM | POA: Diagnosis not present

## 2022-12-17 ENCOUNTER — Emergency Department (HOSPITAL_BASED_OUTPATIENT_CLINIC_OR_DEPARTMENT_OTHER): Payer: Medicare Other

## 2022-12-17 ENCOUNTER — Encounter (HOSPITAL_BASED_OUTPATIENT_CLINIC_OR_DEPARTMENT_OTHER): Payer: Self-pay

## 2022-12-17 ENCOUNTER — Inpatient Hospital Stay (HOSPITAL_BASED_OUTPATIENT_CLINIC_OR_DEPARTMENT_OTHER)
Admission: EM | Admit: 2022-12-17 | Discharge: 2022-12-20 | DRG: 871 | Disposition: A | Discharge status: Home / Self Care | Payer: Medicare Other | Attending: Internal Medicine | Admitting: Internal Medicine

## 2022-12-17 ENCOUNTER — Other Ambulatory Visit: Payer: Self-pay

## 2022-12-17 DIAGNOSIS — N186 End stage renal disease: Secondary | ICD-10-CM | POA: Diagnosis not present

## 2022-12-17 DIAGNOSIS — Z885 Allergy status to narcotic agent status: Secondary | ICD-10-CM

## 2022-12-17 DIAGNOSIS — Z1152 Encounter for screening for COVID-19: Secondary | ICD-10-CM | POA: Diagnosis not present

## 2022-12-17 DIAGNOSIS — R0602 Shortness of breath: Secondary | ICD-10-CM | POA: Diagnosis not present

## 2022-12-17 DIAGNOSIS — I5043 Acute on chronic combined systolic (congestive) and diastolic (congestive) heart failure: Secondary | ICD-10-CM | POA: Diagnosis not present

## 2022-12-17 DIAGNOSIS — J9601 Acute respiratory failure with hypoxia: Secondary | ICD-10-CM | POA: Diagnosis present

## 2022-12-17 DIAGNOSIS — N189 Chronic kidney disease, unspecified: Secondary | ICD-10-CM | POA: Diagnosis not present

## 2022-12-17 DIAGNOSIS — R6520 Severe sepsis without septic shock: Secondary | ICD-10-CM | POA: Diagnosis present

## 2022-12-17 DIAGNOSIS — Z87891 Personal history of nicotine dependence: Secondary | ICD-10-CM | POA: Diagnosis not present

## 2022-12-17 DIAGNOSIS — Z96641 Presence of right artificial hip joint: Secondary | ICD-10-CM | POA: Diagnosis present

## 2022-12-17 DIAGNOSIS — Z794 Long term (current) use of insulin: Secondary | ICD-10-CM | POA: Diagnosis not present

## 2022-12-17 DIAGNOSIS — Z79899 Other long term (current) drug therapy: Secondary | ICD-10-CM

## 2022-12-17 DIAGNOSIS — Z7982 Long term (current) use of aspirin: Secondary | ICD-10-CM | POA: Diagnosis not present

## 2022-12-17 DIAGNOSIS — N250 Renal osteodystrophy: Secondary | ICD-10-CM | POA: Diagnosis not present

## 2022-12-17 DIAGNOSIS — E785 Hyperlipidemia, unspecified: Secondary | ICD-10-CM | POA: Diagnosis present

## 2022-12-17 DIAGNOSIS — E7800 Pure hypercholesterolemia, unspecified: Secondary | ICD-10-CM | POA: Diagnosis present

## 2022-12-17 DIAGNOSIS — E871 Hypo-osmolality and hyponatremia: Secondary | ICD-10-CM | POA: Diagnosis not present

## 2022-12-17 DIAGNOSIS — R051 Acute cough: Secondary | ICD-10-CM

## 2022-12-17 DIAGNOSIS — Z862 Personal history of diseases of the blood and blood-forming organs and certain disorders involving the immune mechanism: Secondary | ICD-10-CM | POA: Diagnosis not present

## 2022-12-17 DIAGNOSIS — I509 Heart failure, unspecified: Secondary | ICD-10-CM | POA: Diagnosis not present

## 2022-12-17 DIAGNOSIS — Z880 Allergy status to penicillin: Secondary | ICD-10-CM

## 2022-12-17 DIAGNOSIS — E782 Mixed hyperlipidemia: Secondary | ICD-10-CM | POA: Diagnosis not present

## 2022-12-17 DIAGNOSIS — E119 Type 2 diabetes mellitus without complications: Secondary | ICD-10-CM

## 2022-12-17 DIAGNOSIS — I2510 Atherosclerotic heart disease of native coronary artery without angina pectoris: Secondary | ICD-10-CM | POA: Diagnosis present

## 2022-12-17 DIAGNOSIS — E875 Hyperkalemia: Secondary | ICD-10-CM | POA: Diagnosis present

## 2022-12-17 DIAGNOSIS — E8770 Fluid overload, unspecified: Secondary | ICD-10-CM

## 2022-12-17 DIAGNOSIS — D125 Benign neoplasm of sigmoid colon: Secondary | ICD-10-CM | POA: Diagnosis not present

## 2022-12-17 DIAGNOSIS — Z9861 Coronary angioplasty status: Secondary | ICD-10-CM

## 2022-12-17 DIAGNOSIS — D631 Anemia in chronic kidney disease: Secondary | ICD-10-CM | POA: Diagnosis present

## 2022-12-17 DIAGNOSIS — A419 Sepsis, unspecified organism: Secondary | ICD-10-CM | POA: Diagnosis not present

## 2022-12-17 DIAGNOSIS — I5042 Chronic combined systolic (congestive) and diastolic (congestive) heart failure: Secondary | ICD-10-CM | POA: Diagnosis present

## 2022-12-17 DIAGNOSIS — E8809 Other disorders of plasma-protein metabolism, not elsewhere classified: Secondary | ICD-10-CM | POA: Diagnosis present

## 2022-12-17 DIAGNOSIS — R0902 Hypoxemia: Secondary | ICD-10-CM

## 2022-12-17 DIAGNOSIS — E878 Other disorders of electrolyte and fluid balance, not elsewhere classified: Secondary | ICD-10-CM | POA: Diagnosis present

## 2022-12-17 DIAGNOSIS — J189 Pneumonia, unspecified organism: Secondary | ICD-10-CM | POA: Diagnosis present

## 2022-12-17 DIAGNOSIS — E1122 Type 2 diabetes mellitus with diabetic chronic kidney disease: Secondary | ICD-10-CM | POA: Diagnosis present

## 2022-12-17 DIAGNOSIS — E1142 Type 2 diabetes mellitus with diabetic polyneuropathy: Secondary | ICD-10-CM | POA: Diagnosis not present

## 2022-12-17 DIAGNOSIS — I252 Old myocardial infarction: Secondary | ICD-10-CM

## 2022-12-17 DIAGNOSIS — Z823 Family history of stroke: Secondary | ICD-10-CM

## 2022-12-17 DIAGNOSIS — K5289 Other specified noninfective gastroenteritis and colitis: Secondary | ICD-10-CM | POA: Diagnosis not present

## 2022-12-17 DIAGNOSIS — Z888 Allergy status to other drugs, medicaments and biological substances status: Secondary | ICD-10-CM | POA: Diagnosis not present

## 2022-12-17 DIAGNOSIS — Z992 Dependence on renal dialysis: Secondary | ICD-10-CM

## 2022-12-17 DIAGNOSIS — I132 Hypertensive heart and chronic kidney disease with heart failure and with stage 5 chronic kidney disease, or end stage renal disease: Secondary | ICD-10-CM | POA: Diagnosis not present

## 2022-12-17 DIAGNOSIS — R6883 Chills (without fever): Secondary | ICD-10-CM

## 2022-12-17 HISTORY — DX: Dependence on renal dialysis: N18.6

## 2022-12-17 HISTORY — DX: Dependence on renal dialysis: Z99.2

## 2022-12-17 LAB — COMPREHENSIVE METABOLIC PANEL
ALT: 50 U/L — ABNORMAL HIGH (ref 0–44)
AST: 58 U/L — ABNORMAL HIGH (ref 15–41)
Albumin: 3.6 g/dL (ref 3.5–5.0)
Alkaline Phosphatase: 347 U/L — ABNORMAL HIGH (ref 38–126)
Anion gap: 13 (ref 5–15)
BUN: 55 mg/dL — ABNORMAL HIGH (ref 8–23)
CO2: 24 mmol/L (ref 22–32)
Calcium: 9.7 mg/dL (ref 8.9–10.3)
Chloride: 89 mmol/L — ABNORMAL LOW (ref 98–111)
Creatinine, Ser: 4.5 mg/dL — ABNORMAL HIGH (ref 0.61–1.24)
GFR, Estimated: 12 mL/min — ABNORMAL LOW (ref >60)
Glucose, Bld: 165 mg/dL — ABNORMAL HIGH (ref 70–99)
Potassium: 5.4 mmol/L — ABNORMAL HIGH (ref 3.5–5.1)
Sodium: 126 mmol/L — ABNORMAL LOW (ref 135–145)
Total Bilirubin: 1.1 mg/dL (ref 0.3–1.2)
Total Protein: 7.4 g/dL (ref 6.5–8.1)

## 2022-12-17 LAB — CBC
HCT: 33.5 % — ABNORMAL LOW (ref 39.0–52.0)
Hemoglobin: 11.4 g/dL — ABNORMAL LOW (ref 13.0–17.0)
MCH: 33.3 pg (ref 26.0–34.0)
MCHC: 34 g/dL (ref 30.0–36.0)
MCV: 98 fL (ref 80.0–100.0)
Platelets: 182 10*3/uL (ref 150–400)
RBC: 3.42 MIL/uL — ABNORMAL LOW (ref 4.22–5.81)
RDW: 15.6 % — ABNORMAL HIGH (ref 11.5–15.5)
WBC: 9.8 10*3/uL (ref 4.0–10.5)
nRBC: 0 % (ref 0.0–0.2)

## 2022-12-17 LAB — RESP PANEL BY RT-PCR (RSV, FLU A&B, COVID)  RVPGX2
Influenza A by PCR: NEGATIVE
Influenza B by PCR: NEGATIVE
Resp Syncytial Virus by PCR: NEGATIVE
SARS Coronavirus 2 by RT PCR: NEGATIVE

## 2022-12-17 MED ORDER — SODIUM CHLORIDE 0.9 % IV SOLN
1.0000 g | Freq: Once | INTRAVENOUS | Status: AC
  Administered 2022-12-17: 1 g via INTRAVENOUS
  Filled 2022-12-17: qty 10

## 2022-12-17 MED ORDER — FENTANYL CITRATE PF 50 MCG/ML IJ SOSY
50.0000 ug | PREFILLED_SYRINGE | Freq: Once | INTRAMUSCULAR | Status: AC
  Administered 2022-12-17: 50 ug via INTRAVENOUS
  Filled 2022-12-17: qty 1

## 2022-12-17 MED ORDER — CHLORHEXIDINE GLUCONATE CLOTH 2 % EX PADS
6.0000 | MEDICATED_PAD | Freq: Every day | CUTANEOUS | Status: DC
  Administered 2022-12-18 – 2022-12-20 (×3): 6 via TOPICAL
  Filled 2022-12-17: qty 6

## 2022-12-17 MED ORDER — SODIUM CHLORIDE 0.9 % IV SOLN
500.0000 mg | Freq: Once | INTRAVENOUS | Status: AC
  Administered 2022-12-17: 500 mg via INTRAVENOUS
  Filled 2022-12-17: qty 5

## 2022-12-17 NOTE — Progress Notes (Signed)
Called to request for dialysis for this patient who is MWF HD pt at [facility]. Pt presented to [facility] ED c/o SOB, cough and orthopnea. Pt was not in distress and is going to be admitted at [facility]. Have placed orders for HD in the morning.   [facility] MWF HD 4h  71kg  3K/2.5Ca bath  400/1.5   R AVG  Heparin 4000   Vinson Moselle, MD  [facility] 12/17/2022, 10:10 PM  Recent Labs  Lab 12/17/22 1920  HGB 11.4*  ALBUMIN 3.6  CALCIUM 9.7  CREATININE 4.50*  K 5.4*    Inpatient medications:  [START ON 12/18/2022] Chlorhexidine Gluconate Cloth  6 each Topical Q0600    azithromycin     cefTRIAXone (ROCEPHIN)  IV

## 2022-12-17 NOTE — ED Provider Notes (Addendum)
[place] EMERGENCY DEPARTMENT AT [facility] Provider Note   CSN: [id number] Arrival date & time: 12/17/22  1910     History  Chief Complaint  Patient presents with   Shortness of Breath    Robert Lucas is a 82 y.o. male.  The history is provided by the patient and medical records. No language interpreter was used.  Shortness of Breath Severity:  Severe Onset quality:  Gradual Duration:  1 week Timing:  Constant Progression:  Waxing and waning Chronicity:  Recurrent Context: URI   Relieved by:  Nothing Worsened by:  Coughing Ineffective treatments:  None tried Associated symptoms: cough and sputum production   Associated symptoms: no abdominal pain, no chest pain, no diaphoresis, no fever, no headaches, no sore throat, no vomiting and no wheezing        Home Medications Prior to Admission medications   Medication Sig Start Date End Date Taking? Authorizing Provider  acetaminophen (TYLENOL) 650 MG CR tablet Take 1,300 mg by mouth in the morning, at noon, and at bedtime.    [provider]  allopurinol (ZYLOPRIM) 100 MG tablet Take 100 mg by mouth in the morning.    [provider]  aspirin EC 81 MG tablet Take 81 mg by mouth daily with supper.    [provider]  atorvastatin (LIPITOR) 40 MG tablet Take 40 mg by mouth at bedtime.    [provider]  CALCITRIOL PO Take 2 capsules by mouth every Monday, Wednesday, and Friday with hemodialysis.    [provider]  finasteride (PROSCAR) 5 MG tablet Take 5 mg by mouth daily with supper.    [provider]  gabapentin (NEURONTIN) 300 MG capsule Take 1 capsule (300 mg total) by mouth at bedtime. 06/09/22   Lars Mage, PA-C  insulin aspart protamine - aspart (NOVOLOG MIX 70/30 FLEXPEN) (70-30) 100 UNIT/ML FlexPen Inject 14-20 Units into the skin daily as needed (high blood sugar). If BS is <150=0 units, If BS is 150-200 units=14 units, If BS is >201=20 units     [provider]  IRON SUCROSE IV Inject into the vein as needed. At dialysis    [provider]  Methoxy PEG-Epoetin Beta (MIRCERA IJ) Mircera 05/15/22 05/14/23  [provider]  metoprolol succinate (TOPROL-XL) 50 MG 24 hr tablet Take 1 tablet (50 mg total) by mouth daily. Take with or immediately following a meal. 06/20/22   Patwardhan, Manish J, MD  tamsulosin (FLOMAX) 0.4 MG CAPS capsule Take 1 capsule (0.4 mg total) by mouth daily after supper. Patient taking differently: Take 0.4 mg by mouth at bedtime. 07/11/20   Albertine Grates, MD  traMADol (ULTRAM) 50 MG tablet Take 1 tablet (50 mg total) by mouth every 6 (six) hours as needed. Patient not taking: Reported on 06/20/2022 05/02/22   Emilie Rutter, PA-C      Allergies    Zestril [lisinopril], Hydrocodone, Januvia [sitagliptin], Levitra [vardenafil], Prednisone, Victoza [liraglutide], and Penicillins    Review of Systems   Review of Systems  Constitutional:  Positive for chills and fatigue. Negative for diaphoresis and fever.  HENT:  Negative for congestion and sore throat.   Respiratory:  Positive for cough, sputum production, chest tightness and shortness of breath. Negative for wheezing and stridor.   Cardiovascular:  Positive for leg swelling. Negative for chest pain and palpitations.  Gastrointestinal:  Negative for abdominal pain, constipation, diarrhea, nausea and vomiting.  Genitourinary:  Negative for flank pain.  Musculoskeletal:  Negative for  back pain.  Neurological:  Negative for light-headedness, numbness and headaches.  Psychiatric/Behavioral:  Negative for agitation and confusion.   All other systems reviewed and are negative.   Physical Exam Updated Vital Signs BP 122/67   Pulse 79   Temp (!) 96.3 F (35.7 C) (Temporal)   Resp (!) 26   Ht 5\' 3"  (1.6 m)   Wt 74.8 kg   SpO2 99%   BMI 29.23 kg/m  Physical Exam Vitals and nursing note reviewed.  Constitutional:      General: He is not  in acute distress.    Appearance: He is well-developed. He is not ill-appearing, toxic-appearing or diaphoretic.  HENT:     Head: Normocephalic and atraumatic.  Eyes:     Conjunctiva/sclera: Conjunctivae normal.  Cardiovascular:     Rate and Rhythm: Normal rate and regular rhythm. No extrasystoles are present.    Heart sounds: No murmur heard. Pulmonary:     Effort: Pulmonary effort is normal. Tachypnea present. No respiratory distress.     Breath sounds: Rhonchi present. No rales.  Chest:     Chest wall: No tenderness.  Abdominal:     Palpations: Abdomen is soft.     Tenderness: There is abdominal tenderness.  Musculoskeletal:        General: No swelling.     Cervical back: Neck supple.     Right lower leg: Edema present.     Left lower leg: Edema present.  Skin:    General: Skin is warm and dry.     Capillary Refill: Capillary refill takes less than 2 seconds.     Findings: No erythema.  Neurological:     Mental Status: He is alert.  Psychiatric:        Mood and Affect: Mood normal.     ED Results / Procedures / Treatments   Labs (all labs ordered are listed, but only abnormal results are displayed) Labs Reviewed  COMPREHENSIVE METABOLIC PANEL - Abnormal; Notable for the following components:      Result Value   Sodium 126 (*)    Potassium 5.4 (*)    Chloride 89 (*)    Glucose, Bld 165 (*)    BUN 55 (*)    Creatinine, Ser 4.50 (*)    AST 58 (*)    ALT 50 (*)    Alkaline Phosphatase 347 (*)    GFR, Estimated 12 (*)    All other components within normal limits  CBC - Abnormal; Notable for the following components:   RBC 3.42 (*)    Hemoglobin 11.4 (*)    HCT 33.5 (*)    RDW 15.6 (*)    All other components within normal limits  RESP PANEL BY RT-PCR (RSV, FLU A&B, COVID)  RVPGX2  HEPATITIS B SURFACE ANTIGEN  HEPATITIS B SURFACE ANTIBODY, QUANTITATIVE    EKG EKG Interpretation  Date/Time:  Sunday December 17 2022 19:30:05 EDT Ventricular Rate:  83 PR  Interval:  192 QRS Duration: 94 QT Interval:  406 QTC Calculation: 477 R Axis:   146 Text Interpretation: Sinus rhythm with Premature atrial complexes Incomplete right bundle branch block Possible Right ventricular hypertrophy Septal infarct , age undetermined Lateral infarct , age undetermined Abnormal ECG When compared with ECG of 30-Jun-2022 17:09, PREVIOUS ECG IS PRESENT when comapred to prior,  overall similar appearance. No STEMI Confirmed by Theda Belfast ([id number]) on 12/17/2022 7:57:55 PM  Radiology DG Chest Port 1 View  Result Date: 12/17/2022 CLINICAL DATA:  Shortness of breath  EXAM: PORTABLE CHEST 1 VIEW COMPARISON:  10/16/2021 FINDINGS: Cardiomegaly. Linear atelectasis or scarring in the left mid lung. Right basilar platelike atelectasis or infiltrate. Possible small right pleural effusion. No overt edema. No acute bony abnormality. IMPRESSION: Cardiomegaly. Right base atelectasis or infiltrate. Left mid lung atelectasis or scarring. Electronically Signed   By: Charlett Nose M.D.   On: 12/17/2022 19:38    Procedures Procedures    Medications Ordered in ED Medications  azithromycin (ZITHROMAX) 500 mg in sodium chloride 0.9 % 250 mL IVPB (500 mg Intravenous New Bag/Given 12/17/22 2251)  Chlorhexidine Gluconate Cloth 2 % PADS 6 each (has no administration in time range)  cefTRIAXone (ROCEPHIN) 1 g in sodium chloride 0.9 % 100 mL IVPB (0 g Intravenous Stopped 12/17/22 2251)  fentaNYL (SUBLIMAZE) injection 50 mcg (50 mcg Intravenous Given 12/17/22 2222)    ED Course/ Medical Decision Making/ A&P                             Medical Decision Making Amount and/or Complexity of Data Reviewed Labs: ordered. Radiology: ordered.  Risk Prescription drug management. Decision regarding hospitalization.    DEMETRIK LATTANZI is a 82 y.o. male with a past medical history significant for hypertension, hyperlipidemia, diabetes, heart failure, ESRD on dialysis Monday Wednesday Friday, CAD with  previous MI, hypercholesterolemia, and previous pneumonia who presents with chills, productive cough, worsening peripheral edema, orthopnea, and shortness of breath.  According to patient, for the last few days he has had to use a pillow and sleeping with his back elevated due to shortness of breath.  He is also more peripheral edema.  He has not missed any dialysis treatments recently but reports several months ago he did have pneumonia.  He reports he is developed cough and is coughing up a phlegm like sputum.  He denies any chest pain or palpitations but has some pain in his right lower back.  He denies any pleuritic discomfort.  He reports he fell onto his right bottom last week but is not having any persistent pain.  Denies any other trauma to his chest.  Denies any nausea, vomiting, constipation or diarrhea.  He does make some urine still.  He reports he is having more peripheral edema and thinks he is fluid overloaded 2.  On my initial evaluation, oxygen saturations were in the 90s however during my exam, oxygen saturation dropped to 87% on 2 L.  He does not take oxygen at home.  He is now on 4 L to maintain oxygen saturations in the 90s.  On exam, he has coarse breath sounds in the bases bilaterally right worse than left.  Chest nontender.  Abdomen nontender.  Legs are edematous bilaterally.  Patient has to sit on side of the bed because he did not want to lay back due to the shortness of breath.  No wheezing.  Patient had workup in triage that showed evidence of pneumonia.  He also has cardiomegaly and some degree of edema and fluid overload.  As he is a dialysis patient, will hold on a BNP as it would likely be elevated but I do think he has extra fluid on board.  Labs revealed he was negative for COVID and flu and RSV but he does have some hyponatremia and hypochloremia and elevated creatinine likely due to his fluid shifts and dialysis.  No leukocytosis.  Given his lack of chest pain troponin  initially not ordered.  Patient does  have some elevation in his liver function.  Right upper quadrant was nontender on my exam.  Will defer further liver evaluation to admitting team  I had a shared decision-making conversation given his new oxygen requirement of 4 L, he needs admission for the pneumonia and fluid overload.  I spoke to nephrology who will see him for dialysis likely tomorrow to keep him on schedule.   Will Admit for further management.     Final Clinical Impression(s) / ED Diagnoses Final diagnoses:  Hypoxia  Community acquired pneumonia of right lower lobe of lung  Chills  Acute cough  Hypervolemia, unspecified hypervolemia type      Clinical Impression: 1. Hypoxia   2. Community acquired pneumonia of right lower lobe of lung   3. Chills   4. Acute cough   5. Hypervolemia, unspecified hypervolemia type     Disposition: Admit  This note was prepared with assistance of Dragon voice recognition software. Occasional wrong-word or sound-a-like substitutions may have occurred due to the inherent limitations of voice recognition software.     Orlena Garmon, Canary Brim, MD 12/17/22 2214    Alice Burnside, Canary Brim, MD 12/17/22 2312

## 2022-12-17 NOTE — ED Notes (Signed)
Patient not hypoxic however placed on 2 L of O2 for comfort at this time.

## 2022-12-17 NOTE — ED Triage Notes (Signed)
POV from home, A&O x 4, GCS 15, BIB wheelchair.   Increased SOB over the last week, dialysis MWF, denies missing any sessions, c/o right shoulder pain and back pain, denies chest pain.

## 2022-12-18 ENCOUNTER — Encounter (HOSPITAL_COMMUNITY): Payer: Self-pay | Admitting: Internal Medicine

## 2022-12-18 DIAGNOSIS — R6883 Chills (without fever): Secondary | ICD-10-CM | POA: Diagnosis present

## 2022-12-18 DIAGNOSIS — I132 Hypertensive heart and chronic kidney disease with heart failure and with stage 5 chronic kidney disease, or end stage renal disease: Secondary | ICD-10-CM | POA: Diagnosis present

## 2022-12-18 DIAGNOSIS — E1122 Type 2 diabetes mellitus with diabetic chronic kidney disease: Secondary | ICD-10-CM | POA: Diagnosis present

## 2022-12-18 DIAGNOSIS — N189 Chronic kidney disease, unspecified: Secondary | ICD-10-CM

## 2022-12-18 DIAGNOSIS — R0602 Shortness of breath: Secondary | ICD-10-CM | POA: Diagnosis not present

## 2022-12-18 DIAGNOSIS — Z888 Allergy status to other drugs, medicaments and biological substances status: Secondary | ICD-10-CM | POA: Diagnosis not present

## 2022-12-18 DIAGNOSIS — E875 Hyperkalemia: Secondary | ICD-10-CM | POA: Diagnosis present

## 2022-12-18 DIAGNOSIS — E1142 Type 2 diabetes mellitus with diabetic polyneuropathy: Secondary | ICD-10-CM

## 2022-12-18 DIAGNOSIS — E7800 Pure hypercholesterolemia, unspecified: Secondary | ICD-10-CM | POA: Diagnosis present

## 2022-12-18 DIAGNOSIS — E871 Hypo-osmolality and hyponatremia: Secondary | ICD-10-CM | POA: Diagnosis present

## 2022-12-18 DIAGNOSIS — Z87891 Personal history of nicotine dependence: Secondary | ICD-10-CM | POA: Diagnosis not present

## 2022-12-18 DIAGNOSIS — D125 Benign neoplasm of sigmoid colon: Secondary | ICD-10-CM | POA: Diagnosis not present

## 2022-12-18 DIAGNOSIS — R6520 Severe sepsis without septic shock: Secondary | ICD-10-CM | POA: Diagnosis present

## 2022-12-18 DIAGNOSIS — N250 Renal osteodystrophy: Secondary | ICD-10-CM | POA: Diagnosis not present

## 2022-12-18 DIAGNOSIS — I2510 Atherosclerotic heart disease of native coronary artery without angina pectoris: Secondary | ICD-10-CM | POA: Diagnosis present

## 2022-12-18 DIAGNOSIS — J189 Pneumonia, unspecified organism: Secondary | ICD-10-CM

## 2022-12-18 DIAGNOSIS — E8809 Other disorders of plasma-protein metabolism, not elsewhere classified: Secondary | ICD-10-CM | POA: Diagnosis present

## 2022-12-18 DIAGNOSIS — E782 Mixed hyperlipidemia: Secondary | ICD-10-CM | POA: Diagnosis not present

## 2022-12-18 DIAGNOSIS — Z992 Dependence on renal dialysis: Secondary | ICD-10-CM | POA: Diagnosis not present

## 2022-12-18 DIAGNOSIS — Z885 Allergy status to narcotic agent status: Secondary | ICD-10-CM | POA: Diagnosis not present

## 2022-12-18 DIAGNOSIS — I5043 Acute on chronic combined systolic (congestive) and diastolic (congestive) heart failure: Secondary | ICD-10-CM | POA: Diagnosis present

## 2022-12-18 DIAGNOSIS — N186 End stage renal disease: Secondary | ICD-10-CM | POA: Diagnosis present

## 2022-12-18 DIAGNOSIS — Z1152 Encounter for screening for COVID-19: Secondary | ICD-10-CM | POA: Diagnosis not present

## 2022-12-18 DIAGNOSIS — Z862 Personal history of diseases of the blood and blood-forming organs and certain disorders involving the immune mechanism: Secondary | ICD-10-CM

## 2022-12-18 DIAGNOSIS — K5289 Other specified noninfective gastroenteritis and colitis: Secondary | ICD-10-CM | POA: Diagnosis not present

## 2022-12-18 DIAGNOSIS — I509 Heart failure, unspecified: Secondary | ICD-10-CM | POA: Diagnosis not present

## 2022-12-18 DIAGNOSIS — J9601 Acute respiratory failure with hypoxia: Secondary | ICD-10-CM | POA: Diagnosis present

## 2022-12-18 DIAGNOSIS — Z880 Allergy status to penicillin: Secondary | ICD-10-CM | POA: Diagnosis not present

## 2022-12-18 DIAGNOSIS — D631 Anemia in chronic kidney disease: Secondary | ICD-10-CM | POA: Diagnosis present

## 2022-12-18 DIAGNOSIS — Z7982 Long term (current) use of aspirin: Secondary | ICD-10-CM | POA: Diagnosis not present

## 2022-12-18 DIAGNOSIS — Z794 Long term (current) use of insulin: Secondary | ICD-10-CM | POA: Diagnosis not present

## 2022-12-18 DIAGNOSIS — A419 Sepsis, unspecified organism: Secondary | ICD-10-CM

## 2022-12-18 DIAGNOSIS — Z79899 Other long term (current) drug therapy: Secondary | ICD-10-CM | POA: Diagnosis not present

## 2022-12-18 LAB — CBC WITH DIFFERENTIAL/PLATELET
Abs Immature Granulocytes: 0.07 10*3/uL (ref 0.00–0.07)
Basophils Absolute: 0 10*3/uL (ref 0.0–0.1)
Basophils Relative: 0 %
Eosinophils Absolute: 0 10*3/uL (ref 0.0–0.5)
Eosinophils Relative: 0 %
HCT: 33.6 % — ABNORMAL LOW (ref 39.0–52.0)
Hemoglobin: 11.2 g/dL — ABNORMAL LOW (ref 13.0–17.0)
Immature Granulocytes: 1 %
Lymphocytes Relative: 11 %
Lymphs Abs: 1.3 10*3/uL (ref 0.7–4.0)
MCH: 32.8 pg (ref 26.0–34.0)
MCHC: 33.3 g/dL (ref 30.0–36.0)
MCV: 98.5 fL (ref 80.0–100.0)
Monocytes Absolute: 0.9 10*3/uL (ref 0.1–1.0)
Monocytes Relative: 8 %
Neutro Abs: 8.9 10*3/uL — ABNORMAL HIGH (ref 1.7–7.7)
Neutrophils Relative %: 80 %
Platelets: 141 10*3/uL — ABNORMAL LOW (ref 150–400)
RBC: 3.41 MIL/uL — ABNORMAL LOW (ref 4.22–5.81)
RDW: 15.6 % — ABNORMAL HIGH (ref 11.5–15.5)
WBC: 11.3 10*3/uL — ABNORMAL HIGH (ref 4.0–10.5)
nRBC: 0 % (ref 0.0–0.2)

## 2022-12-18 LAB — BASIC METABOLIC PANEL
Anion gap: 17 — ABNORMAL HIGH (ref 5–15)
BUN: 61 mg/dL — ABNORMAL HIGH (ref 8–23)
CO2: 20 mmol/L — ABNORMAL LOW (ref 22–32)
Calcium: 8.9 mg/dL (ref 8.9–10.3)
Chloride: 90 mmol/L — ABNORMAL LOW (ref 98–111)
Creatinine, Ser: 4.73 mg/dL — ABNORMAL HIGH (ref 0.61–1.24)
GFR, Estimated: 12 mL/min — ABNORMAL LOW (ref >60)
Glucose, Bld: 146 mg/dL — ABNORMAL HIGH (ref 70–99)
Potassium: 5.5 mmol/L — ABNORMAL HIGH (ref 3.5–5.1)
Sodium: 127 mmol/L — ABNORMAL LOW (ref 135–145)

## 2022-12-18 LAB — GLUCOSE, CAPILLARY
Glucose-Capillary: 147 mg/dL — ABNORMAL HIGH (ref 70–99)
Glucose-Capillary: 223 mg/dL — ABNORMAL HIGH (ref 70–99)
Glucose-Capillary: 207 mg/dL — ABNORMAL HIGH (ref 70–99)

## 2022-12-18 LAB — HEMOGLOBIN A1C
Hgb A1c MFr Bld: 8.8 % — ABNORMAL HIGH (ref 4.8–5.6)
Mean Plasma Glucose: 205.86 mg/dL

## 2022-12-18 LAB — HEPATITIS B SURFACE ANTIGEN: Hepatitis B Surface Ag: NONREACTIVE

## 2022-12-18 LAB — LACTIC ACID, PLASMA: Lactic Acid, Venous: 1.8 mmol/L (ref 0.5–1.9)

## 2022-12-18 MED ORDER — ORAL CARE MOUTH RINSE: 15.0000 mL | OROMUCOSAL | Status: DC | PRN

## 2022-12-18 MED ORDER — ACETAMINOPHEN 325 MG PO TABS: 650.0000 mg | ORAL_TABLET | Freq: Four times a day (QID) | ORAL | Status: DC | PRN

## 2022-12-18 MED ORDER — ATORVASTATIN CALCIUM 40 MG PO TABS
40.0000 mg | ORAL_TABLET | Freq: Every day | ORAL | Status: DC
  Administered 2022-12-18 – 2022-12-19 (×2): 40 mg via ORAL
  Filled 2022-12-18 (×2): qty 1

## 2022-12-18 MED ORDER — ACETAMINOPHEN 650 MG RE SUPP: 650.0000 mg | Freq: Four times a day (QID) | RECTAL | Status: DC | PRN

## 2022-12-18 MED ORDER — CALCITRIOL 0.25 MCG PO CAPS
0.2500 ug | ORAL_CAPSULE | ORAL | Status: DC
  Administered 2022-12-18 – 2022-12-20 (×2): 0.25 ug via ORAL
  Filled 2022-12-18 (×3): qty 1

## 2022-12-18 MED ORDER — SODIUM CHLORIDE 0.9 % IV SOLN: 1.0000 g | INTRAVENOUS | Status: DC

## 2022-12-18 MED ORDER — HEPARIN SODIUM (PORCINE) 1000 UNIT/ML IJ SOLN
4000.0000 [IU] | Freq: Once | INTRAMUSCULAR | Status: AC
  Administered 2022-12-19: 4000 [IU]
  Filled 2022-12-18: qty 4

## 2022-12-18 MED ORDER — INSULIN ASPART 100 UNIT/ML IJ SOLN
0.0000 [IU] | Freq: Three times a day (TID) | INTRAMUSCULAR | Status: DC
  Administered 2022-12-18: 2 [IU] via SUBCUTANEOUS
  Administered 2022-12-19: 1 [IU] via SUBCUTANEOUS

## 2022-12-18 MED ORDER — SODIUM CHLORIDE 0.9 % IV SOLN
2.0000 g | INTRAVENOUS | Status: DC
  Administered 2022-12-18 – 2022-12-19 (×2): 2 g via INTRAVENOUS
  Filled 2022-12-18 (×2): qty 20

## 2022-12-18 MED ORDER — HEPARIN SODIUM (PORCINE) 1000 UNIT/ML IJ SOLN
4000.0000 [IU] | Freq: Once | INTRAMUSCULAR | Status: DC
  Filled 2022-12-18: qty 4

## 2022-12-18 MED ORDER — MELATONIN 3 MG PO TABS
3.0000 mg | ORAL_TABLET | Freq: Every evening | ORAL | Status: DC | PRN
  Administered 2022-12-19: 3 mg via ORAL
  Filled 2022-12-18: qty 1

## 2022-12-18 MED ORDER — ASPIRIN 81 MG PO TBEC
81.0000 mg | DELAYED_RELEASE_TABLET | Freq: Every day | ORAL | Status: DC
  Administered 2022-12-18 – 2022-12-19 (×2): 81 mg via ORAL
  Filled 2022-12-18 (×2): qty 1

## 2022-12-18 MED ORDER — ONDANSETRON HCL 4 MG/2ML IJ SOLN: 4.0000 mg | Freq: Four times a day (QID) | INTRAMUSCULAR | Status: DC | PRN

## 2022-12-18 MED ORDER — SODIUM CHLORIDE 0.9 % IV SOLN
500.0000 mg | INTRAVENOUS | Status: DC
  Administered 2022-12-18 – 2022-12-19 (×2): 500 mg via INTRAVENOUS
  Filled 2022-12-18 (×2): qty 5

## 2022-12-18 MED ORDER — HEPARIN SODIUM (PORCINE) 1000 UNIT/ML DIALYSIS
4000.0000 [IU] | Freq: Once | INTRAMUSCULAR | Status: AC
  Administered 2022-12-18: 4000 [IU] via INTRAVENOUS_CENTRAL
  Filled 2022-12-18: qty 4

## 2022-12-18 MED ORDER — SODIUM ZIRCONIUM CYCLOSILICATE 10 G PO PACK
10.0000 g | PACK | Freq: Once | ORAL | Status: AC
  Administered 2022-12-18: 10 g via ORAL
  Filled 2022-12-18: qty 1

## 2022-12-18 NOTE — Procedures (Signed)
Patient seen and examined on Hemodialysis. The procedure was supervised and I have made appropriate changes. BP 115/65 (BP Location: Right Arm)   Pulse 76   Temp 97.7 F (36.5 C) (Oral)   Resp 20   Ht 5\' 3"  (1.6 m)   Wt 76.2 kg Comment: bed  SpO2 100%   BMI 29.76 kg/m   QB 400 mL/ min via AVF, UF goal 3L  Tolerating treatment without complaints at this time.   Bufford Buttner MD [facility] [phone] 3:16 PM

## 2022-12-18 NOTE — Progress Notes (Signed)
Pt receives out-pt HD at [facility] on MWF. Will assist as needed.   Casey Fye Renal Navigator [phone] 

## 2022-12-18 NOTE — Progress Notes (Signed)
Received patient in bed to unit.  Alert and oriented.  Informed consent signed and in chart.   TX duration:3.75  Patient tolerated well.  Transported back to the room  Alert, without acute distress.  Hand-off given to patient's nurse.   Access used: left AVG Access issues: NONE  Total UF removed: 3L Medication(s) given: NONE   12/18/22 1705  Vitals  BP 129/68  BP Location Right Arm  BP Method Automatic  Patient Position (if appropriate) Lying  Pulse Rate 76  Pulse Rate Source Monitor  Resp (!) 25  Oxygen Therapy  SpO2 100 %  O2 Device Nasal Cannula  O2 Flow Rate (L/min) 4 L/min  Patient Activity (if Appropriate) In bed  Pulse Oximetry Type Continuous  During Treatment Monitoring  HD Safety Checks Performed Yes  Intra-Hemodialysis Comments Tolerated well;Tx completed  Dialysis Fluid Bolus Normal Saline  Bolus Amount (mL) 300 mL  Note  Observations tx complete  Fistula / Graft Left Upper arm Arteriovenous vein graft  Placement Date/Time: 05/02/22 1001   Placed prior to admission: No  Orientation: Left  Access Location: Upper arm  Access Type: Arteriovenous vein graft  Expiration Date: 11/22/26  Site Condition No complications  Fistula / Graft Assessment Present;Thrill;Bruit  Status Flushed (tx complete)  Needle Size 15  Drainage Description None    Deo Mehringer [facility]

## 2022-12-18 NOTE — ED Notes (Signed)
Dr. Margo Aye admitting MD at [facility] paged due to patient still being hypotensive.

## 2022-12-18 NOTE — Consult Note (Signed)
Reason for Consult: To manage dialysis and dialysis related needs Referring Physician: Dr Verlin Grills is an 82 y.o. male.  HPI: Pt is an 44M with a PMH sig for ESRD on HD MWF, DM II, HLD, HTN, and CHF who is now seen in consultation at the request of Dr. Renford Dills for eval and recs re: provision of HD and management of ESRD.  Initially presented to [facility] 6/16 for SOB.  Found to have volume overload/ CHF and severe sepsis d/t CAP.  Cultures/ antibiotics etc done.  Transferred to [facility] for [facility] and [facility].  Pt says he's been on HD since April.  Has a good bit of LE edema today.  EDW is 71 kg.  Says he's adherent to rx.    NW MWF HD 4h  71kg  3K/2.5Ca bath  400/1.5   R AVG  Heparin 4000  Past Medical History:  Diagnosis Date   Arthritis    CHF (congestive heart failure) (HCC)    HFrEF   CKD (chronic kidney disease)    [facility] [place]   Coronary artery disease    Diabetes (HCC)    type 2   sees endo at [place] in [place]   ESRD (end stage renal disease) on dialysis [facility])    M,W,F   Hypercholesteremia    Hypertension    Myocardial infarction (HCC) 1990   Neuromuscular disorder (HCC)    Pneumonia    PONV (postoperative nausea and vomiting)     Past Surgical History:  Procedure Laterality Date   AV FISTULA PLACEMENT Left 05/17/2021   Procedure: LEFT ARM ARTERIOVENOUS (AV) FISTULA CREATION;  Surgeon: Victorino Sparrow, MD;  Location: [facility] OR;  Service: Vascular;  Laterality: Left;  PERIPHERAL NERVE BLOCK   AV FISTULA PLACEMENT Left 05/02/2022   Procedure: INSERTION OF LEFT ARTERIOVENOUS (AV) GORE-TEX GRAFT;  Surgeon: Victorino Sparrow, MD;  Location: [facility] OR;  Service: Vascular;  Laterality: Left;   BACK SURGERY  yrs ago   lower   CARDIAC CATHETERIZATION     CATARACT EXTRACTION Right 01/2021   CHOLECYSTECTOMY N/A 05/02/2017   Procedure: LAPAROSCOPIC CHOLECYSTECTOMY;  Surgeon: Axel Filler, MD;  Location: [facility] OR;  Service: General;  Laterality: N/A;    CORONARY ANGIOPLASTY     2 1990   EYE SURGERY Left 03/2021   cataract removal   IR FLUORO GUIDE CV LINE RIGHT  10/15/2021   IR US GUIDE VASC ACCESS RIGHT  10/15/2021   KNEE SURGERY Left yrs ago   arthroscopic   LIGATION OF COMPETING BRANCHES OF ARTERIOVENOUS FISTULA Left 11/22/2021   Procedure: LEFT ARM FISTULA BRANCH LIGATION;  Surgeon: Victorino Sparrow, MD;  Location: [facility] OR;  Service: Vascular;  Laterality: Left;   PERIPHERAL VASCULAR BALLOON ANGIOPLASTY  10/19/2021   Procedure: PERIPHERAL VASCULAR BALLOON ANGIOPLASTY;  Surgeon: Victorino Sparrow, MD;  Location: [facility] INVASIVE CV LAB;  Service: Cardiovascular;;  Left AVF   ROTATOR CUFF REPAIR Left yrs ago   SHOULDER OPEN ROTATOR CUFF REPAIR Right 04/23/2013   Procedure: RIGHT SHOULDER ROTATOR CUFF REPAIR WITH GRAFT AND ANCHORS ;  Surgeon: Jacki Cones, MD;  Location: [facility] ORS;  Service: Orthopedics;  Laterality: Right;   TONSILLECTOMY     TOTAL HIP ARTHROPLASTY Right 05/06/2019   Procedure: TOTAL HIP ARTHROPLASTY ANTERIOR APPROACH;  Surgeon: Durene Romans, MD;  Location: [facility] ORS;  Service: Orthopedics;  Laterality: Right;  70 mins    Family History  Problem Relation Age of Onset   Dementia Mother  Stroke Brother     Social History:  reports that he quit smoking about 34 years ago. His smoking use included cigars. He has never been exposed to tobacco smoke. He has never used smokeless tobacco. He reports that he does not currently use alcohol. He reports that he does not use drugs.  Allergies:  Allergies  Allergen Reactions   Zestril [Lisinopril] Cough   Hydrocodone Nausea Only   Januvia [Sitagliptin] Other (See Comments)    Unknown   Levitra [Vardenafil] Other (See Comments)    flushing   Prednisone Other (See Comments)    Elevates BGL; patient prefers to not take this   Victoza [Liraglutide] Other (See Comments)    GI upset   Penicillins Rash and Other (See Comments)    Rash around ankles Has patient had a PCN reaction  causing immediate rash, facial/tongue/throat swelling, SOB or lightheadedness with hypotension: Yes Has patient had a PCN reaction causing severe rash involving mucus membranes or skin necrosis: Unk Has patient had a PCN reaction that required hospitalization: No Has patient had a PCN reaction occurring within the last 10 years: No If all of the above answers are "NO", then may proceed with Cephalosporin use.     Medications: Scheduled:  aspirin EC  81 mg Oral Q supper   atorvastatin  40 mg Oral QHS   calcitRIOL  0.25 mcg Oral Q M,W,F-HD   Chlorhexidine Gluconate Cloth  6 each Topical Q0600   [START ON 12/19/2022] heparin  4,000 Units Dialysis Once in dialysis   insulin aspart  0-6 Units Subcutaneous TID WC     Results for orders placed or performed during the hospital encounter of 12/17/22 (from the past 48 hour(s))  Resp panel by RT-PCR (RSV, Flu A&B, Covid) Anterior Nasal Swab     Status: None   Collection Time: 12/17/22  7:20 PM   Specimen: Anterior Nasal Swab  Result Value Ref Range   SARS Coronavirus 2 by RT PCR NEGATIVE NEGATIVE    Comment: (NOTE) SARS-CoV-2 target nucleic acids are NOT DETECTED.  The SARS-CoV-2 RNA is generally detectable in upper respiratory specimens during the acute phase of infection. The lowest concentration of SARS-CoV-2 viral copies this assay can detect is 138 copies/mL. A negative result does not preclude SARS-Cov-2 infection and should not be used as the sole basis for treatment or other patient management decisions. A negative result may occur with  improper specimen collection/handling, submission of specimen other than nasopharyngeal swab, presence of viral mutation(s) within the areas targeted by this assay, and inadequate number of viral copies(<138 copies/mL). A negative result must be combined with clinical observations, patient history, and epidemiological information. The expected result is Negative.  Fact Sheet for Patients:   BloggerCourse.com  Fact Sheet for Healthcare Providers:  SeriousBroker.it  This test is no t yet approved or cleared by the [place] FDA and  has been authorized for detection and/or diagnosis of SARS-CoV-2 by FDA under an Emergency Use Authorization (EUA). This EUA will remain  in effect (meaning this test can be used) for the duration of the COVID-19 declaration under Section 564(b)(1) of the Act, 21 U.S.C.section 360bbb-3(b)(1), unless the authorization is terminated  or revoked sooner.       Influenza A by PCR NEGATIVE NEGATIVE   Influenza B by PCR NEGATIVE NEGATIVE    Comment: (NOTE) The Xpert Xpress SARS-CoV-2/FLU/RSV plus assay is intended as an aid in the diagnosis of influenza from Nasopharyngeal swab specimens and should not be used as  a sole basis for treatment. Nasal washings and aspirates are unacceptable for Xpert Xpress SARS-CoV-2/FLU/RSV testing.  Fact Sheet for Patients: BloggerCourse.com  Fact Sheet for Healthcare Providers: SeriousBroker.it  This test is not yet approved or cleared by the [place] FDA and has been authorized for detection and/or diagnosis of SARS-CoV-2 by FDA under an Emergency Use Authorization (EUA). This EUA will remain in effect (meaning this test can be used) for the duration of the COVID-19 declaration under Section 564(b)(1) of the Act, 21 U.S.C. section 360bbb-3(b)(1), unless the authorization is terminated or revoked.     Resp Syncytial Virus by PCR NEGATIVE NEGATIVE    Comment: (NOTE) Fact Sheet for Patients: BloggerCourse.com  Fact Sheet for Healthcare Providers: SeriousBroker.it  This test is not yet approved or cleared by the [place] FDA and has been authorized for detection and/or diagnosis of SARS-CoV-2 by FDA under an Emergency Use Authorization (EUA).  This EUA will remain in effect (meaning this test can be used) for the duration of the COVID-19 declaration under Section 564(b)(1) of the Act, 21 U.S.C. section 360bbb-3(b)(1), unless the authorization is terminated or revoked.  Performed at [facility], [address] [place], [place] [address]   Comprehensive metabolic panel     Status: Abnormal   Collection Time: 12/17/22  7:20 PM  Result Value Ref Range   Sodium 126 (L) 135 - 145 mmol/L   Potassium 5.4 (H) 3.5 - 5.1 mmol/L   Chloride 89 (L) 98 - 111 mmol/L   CO2 24 22 - 32 mmol/L   Glucose, Bld 165 (H) 70 - 99 mg/dL    Comment: Glucose reference range applies only to samples taken after fasting for at least 8 hours.   BUN 55 (H) 8 - 23 mg/dL   Creatinine, Ser 6.04 (H) 0.61 - 1.24 mg/dL   Calcium 9.7 8.9 - 54.0 mg/dL   Total Protein 7.4 6.5 - 8.1 g/dL   Albumin 3.6 3.5 - 5.0 g/dL   AST 58 (H) 15 - 41 U/L   ALT 50 (H) 0 - 44 U/L   Alkaline Phosphatase 347 (H) 38 - 126 U/L   Total Bilirubin 1.1 0.3 - 1.2 mg/dL   GFR, Estimated 12 (L) >60 mL/min    Comment: (NOTE) Calculated using the CKD-EPI Creatinine Equation (2021)    Anion gap 13 5 - 15    Comment: Performed at [facility], [address] [place], [place] [address]  CBC     Status: Abnormal   Collection Time: 12/17/22  7:20 PM  Result Value Ref Range   WBC 9.8 4.0 - 10.5 K/uL   RBC 3.42 (L) 4.22 - 5.81 MIL/uL   Hemoglobin 11.4 (L) 13.0 - 17.0 g/dL   HCT 14.7 (L) 82.9 - 56.2 %   MCV 98.0 80.0 - 100.0 fL   MCH 33.3 26.0 - 34.0 pg   MCHC 34.0 30.0 - 36.0 g/dL   RDW 13.0 (H) 86.5 - 78.4 %   Platelets 182 150 - 400 K/uL   nRBC 0.0 0.0 - 0.2 %    Comment: Performed at [facility], [address], [place], [place] [address]  Glucose, capillary     Status: Abnormal   Collection Time: 12/18/22  6:04 AM  Result Value Ref Range   Glucose-Capillary 147 (H) 70 - 99 mg/dL    Comment: Glucose reference range  applies only to samples taken after fasting for at least 8 hours.  Hepatitis B surface antigen  Status: None   Collection Time: 12/18/22  7:15 AM  Result Value Ref Range   Hepatitis B Surface Ag NON REACTIVE NON REACTIVE    Comment: Performed at [facility], [address] [place], [place] [address]  Hemoglobin A1c     Status: Abnormal   Collection Time: 12/18/22  7:17 AM  Result Value Ref Range   Hgb A1c MFr Bld 8.8 (H) 4.8 - 5.6 %    Comment: (NOTE) Pre diabetes:          5.7%-6.4%  Diabetes:              >6.4%  Glycemic control for   <7.0% adults with diabetes    Mean Plasma Glucose 205.86 mg/dL    Comment: Performed at [facility], [address] [place], [place] [address]  Basic metabolic panel     Status: Abnormal   Collection Time: 12/18/22  7:26 AM  Result Value Ref Range   Sodium 127 (L) 135 - 145 mmol/L   Potassium 5.5 (H) 3.5 - 5.1 mmol/L   Chloride 90 (L) 98 - 111 mmol/L   CO2 20 (L) 22 - 32 mmol/L   Glucose, Bld 146 (H) 70 - 99 mg/dL    Comment: Glucose reference range applies only to samples taken after fasting for at least 8 hours.   BUN 61 (H) 8 - 23 mg/dL   Creatinine, Ser 0.98 (H) 0.61 - 1.24 mg/dL   Calcium 8.9 8.9 - 11.9 mg/dL   GFR, Estimated 12 (L) >60 mL/min    Comment: (NOTE) Calculated using the CKD-EPI Creatinine Equation (2021)    Anion gap 17 (H) 5 - 15    Comment: Performed at [facility], [address] [place], [place] [address]  CBC with Differential/Platelet     Status: Abnormal   Collection Time: 12/18/22  7:27 AM  Result Value Ref Range   WBC 11.3 (H) 4.0 - 10.5 K/uL   RBC 3.41 (L) 4.22 - 5.81 MIL/uL   Hemoglobin 11.2 (L) 13.0 - 17.0 g/dL   HCT 95.6 (L) 21.3 - 08.6 %   MCV 98.5 80.0 - 100.0 fL   MCH 32.8 26.0 - 34.0 pg   MCHC 33.3 30.0 - 36.0 g/dL   RDW 57.8 (H) 46.9 - 62.9 %   Platelets 141 (L) 150 - 400 K/uL   nRBC 0.0 0.0 - 0.2 %   Neutrophils Relative % 80 %   Neutro Abs 8.9 (H) 1.7 - 7.7 K/uL    Lymphocytes Relative 11 %   Lymphs Abs 1.3 0.7 - 4.0 K/uL   Monocytes Relative 8 %   Monocytes Absolute 0.9 0.1 - 1.0 K/uL   Eosinophils Relative 0 %   Eosinophils Absolute 0.0 0.0 - 0.5 K/uL   Basophils Relative 0 %   Basophils Absolute 0.0 0.0 - 0.1 K/uL   Immature Granulocytes 1 %   Abs Immature Granulocytes 0.07 0.00 - 0.07 K/uL    Comment: Performed at [facility], [address] [place], [place] [address]  Lactic acid, plasma     Status: None   Collection Time: 12/18/22  7:27 AM  Result Value Ref Range   Lactic Acid, Venous 1.8 0.5 - 1.9 mmol/L    Comment: Performed at [facility], [address] [place], [place] [address]    DG Chest Port 1 View  Result Date: 12/17/2022 CLINICAL DATA:  Shortness of breath EXAM: PORTABLE CHEST 1 VIEW COMPARISON:  10/16/2021 FINDINGS: Cardiomegaly. Linear atelectasis or scarring in the left mid lung. Right basilar platelike atelectasis or infiltrate. Possible small right pleural effusion. No overt edema. No acute bony abnormality. IMPRESSION: Cardiomegaly. Right base atelectasis or infiltrate. Left mid lung atelectasis or scarring. Electronically Signed   By: Charlett Nose M.D.   On: 12/17/2022 19:38    ROS: all other systems reviewed and are negative except as per HPI Blood pressure 127/68, pulse 78, temperature 98.6 F (37 C), temperature source Oral, resp. rate 18, height 5\' 3"  (1.6 m), weight 73.4 kg, SpO2 100 %. . GEN NAD, sitting up in chair HEENT EOMI PERRL NECK + JVD PULM bibasilar crackles CV RRR ABD soft EXT2+ LE edema to knee NEURO AAO x 3 ACCESS AVG + bruit  Assessment/Plan: 1 Acute hypoxic RF: likely combo of vol overload and CAP, getting antibiotics.  Will do HD here on schedule and will likely need another rx tomorrow to catch up.  D/w pt- in agreement 2 ESRD: MWF [facility],  HD today and then tomorrow off schedule to get volume down 3 Hypertension: only on toprol as OP 4. Anemia of ESRD: Hgb 11.2, no ESA needed  at present 5. Metabolic Bone Disease: BMM Ok.   6.  Dispo: pending  Mackena Plummer 12/18/2022, 11:06 AM

## 2022-12-18 NOTE — H&P (Signed)
History and Physical      Robert Lucas ZOX:[id number] DOB: [birth year] DOA: 12/17/2022; DOS: 12/18/2022  PCP: Joycelyn Rua, MD  Patient coming from: home   I have personally briefly reviewed patient's old medical records in [facility]  Chief Complaint: sob  HPI: WADSWORTH URBANEK is a 82 y.o. male with medical history significant for end-stage renal disease on hemodialysis on Monday, Wednesday, Friday schedule, prior combined systolic/diastolic heart failure, type 2 diabetes mellitus, hyperlipidemia, anemia of chronic kidney disease associated baseline hemoglobin 9-12, who is admitted to [facility] on 12/17/2022 by way of transfer from [facility] emergency department with severe sepsis due to community-acquired pneumonia after presenting from home to the latter facility complaining of shortness of breath.  The patient reports that he initially developed a new productive cough 4 to 5 days ago, associated green appearing sputum in the absence of any hemoptysis.  He notes that this has been associated 4 to 5 days of subjective fever, and chills, in the absence of full body rigors or generalized myalgias.  Subsequently, over the last 2 days, he has developed progressive shortness of breath associate with orthopnea, PND, and worsening edema in the bilateral lower extremities.  He denies any associated chest pain, palpitations, diaphoresis, nausea, vomiting, dizziness Recently, or syncope.  No recently missed hemodialysis sessions and he notes that he is due worsening scheduled hemodialysis session on Monday, 6 7024.  Medical history notable for chronic systolic/diastolic heart failure, with most recent echocardiogram performed in February 2023 notable for LVEF 30 to 35%, no focal wall motion MIs, grade 2 diastolic dysfunction, normal right ventricular systolic function and mild to moderate trickles regurgitation.  Denies any known baseline supplemental oxygen  requirements.     [facility] ED Course:  Vital signs in the ED were notable for the following: Hypothermia noted, with initial temperature 96.3; heart rate 70s to 80s; systolic blood pressures in the 90s to 120s; respiratory rate 15-28, oxygen saturation initially noted to be 88% on room air, Sosan improving into the range of 96 to 97% on 4 L nasal cannula.  Labs were notable for the following: CMP notable for the following: Sodium 126 compared to most recent prior serum sodium tablet of 134 and 70,023, potassium 5.4, bicarbonate 24, glucose 165, calcium, adjusted for mild hypoalbuminemia noted to be 10.1, abdomen 3.6, AST 50, ALT 50, alkaline phosphatase 347, total bilirubin 1.1.  CBC notable for open cell count 9800, hemoglobin 11.46 with normocytic/recurrent properties and relative admission for hemoglobin of 11.6 on December 2020.  COVID, influenza, RSV PCR all negative.  Per my interpretation, EKG in ED demonstrated the following: EKG shows sinus rhythm with single PAC, heart rate 83, normal levels, and also to inversion in V3, otherwise no evidence of T wave changes and no evidence of ST changes, clearness of ST elevation.  Imaging and additional notable ED work-up: Chest x-ray, 1 view, performed radiology read, shows cardiomegaly with evidence of right lower lobe infiltrate consistent with pneumonia,.  While in the ED, the following were administered: Fentanyl 50 mcg IV x 1 dose, azithromycin, Rocephin.  EDP discussed patient's case with on-call nephrology, Dr. Arta Silence, Who conveyed that he will formally consult, and is planning to dialyze the patient on the morning of 12/18/2022.  Subsequently, the patient was admitted to [facility] for further evaluation management of presenting severe sepsis due to community-acquired pneumonia in the setting of right lower lobe infiltrate, complicated by acute Evoxac respiratory failure, with suspicion for secondary acute  on chronic systolic/diastolic heart  failure, with presenting labs also notable for acute hyponatremia as well as hyperkalemia.     Review of Systems: As per HPI otherwise 10 point review of systems negative.   Past Medical History:  Diagnosis Date   Arthritis    CHF (congestive heart failure) (HCC)    HFrEF   CKD (chronic kidney disease)    sees [place] in [place]   Coronary artery disease    Diabetes (HCC)    type 2   sees endo at [place] in [place]   ESRD (end stage renal disease) on dialysis [facility])    M,W,F   Hypercholesteremia    Hypertension    Myocardial infarction (HCC) 1990   Neuromuscular disorder (HCC)    Pneumonia    PONV (postoperative nausea and vomiting)     Past Surgical History:  Procedure Laterality Date   AV FISTULA PLACEMENT Left 05/17/2021   Procedure: LEFT ARM ARTERIOVENOUS (AV) FISTULA CREATION;  Surgeon: Victorino Sparrow, MD;  Location: [facility] OR;  Service: Vascular;  Laterality: Left;  PERIPHERAL NERVE BLOCK   AV FISTULA PLACEMENT Left 05/02/2022   Procedure: INSERTION OF LEFT ARTERIOVENOUS (AV) GORE-TEX GRAFT;  Surgeon: Victorino Sparrow, MD;  Location: [facility] OR;  Service: Vascular;  Laterality: Left;   BACK SURGERY  yrs ago   lower   CARDIAC CATHETERIZATION     CATARACT EXTRACTION Right 01/2021   CHOLECYSTECTOMY N/A 05/02/2017   Procedure: LAPAROSCOPIC CHOLECYSTECTOMY;  Surgeon: Axel Filler, MD;  Location: [facility] OR;  Service: General;  Laterality: N/A;   CORONARY ANGIOPLASTY     2 1990   EYE SURGERY Left 03/2021   cataract removal   IR FLUORO GUIDE CV LINE RIGHT  10/15/2021   IR US GUIDE VASC ACCESS RIGHT  10/15/2021   KNEE SURGERY Left yrs ago   arthroscopic   LIGATION OF COMPETING BRANCHES OF ARTERIOVENOUS FISTULA Left 11/22/2021   Procedure: LEFT ARM FISTULA BRANCH LIGATION;  Surgeon: Victorino Sparrow, MD;  Location: [facility] OR;  Service: Vascular;  Laterality: Left;   PERIPHERAL VASCULAR BALLOON ANGIOPLASTY  10/19/2021   Procedure: PERIPHERAL VASCULAR BALLOON ANGIOPLASTY;  Surgeon:  Victorino Sparrow, MD;  Location: [facility] INVASIVE CV LAB;  Service: Cardiovascular;;  Left AVF   ROTATOR CUFF REPAIR Left yrs ago   SHOULDER OPEN ROTATOR CUFF REPAIR Right 04/23/2013   Procedure: RIGHT SHOULDER ROTATOR CUFF REPAIR WITH GRAFT AND ANCHORS ;  Surgeon: Jacki Cones, MD;  Location: [facility] ORS;  Service: Orthopedics;  Laterality: Right;   TONSILLECTOMY     TOTAL HIP ARTHROPLASTY Right 05/06/2019   Procedure: TOTAL HIP ARTHROPLASTY ANTERIOR APPROACH;  Surgeon: Durene Romans, MD;  Location: [facility] ORS;  Service: Orthopedics;  Laterality: Right;  70 mins    Social History:  reports that he quit smoking about 34 years ago. His smoking use included cigars. He has never been exposed to tobacco smoke. He has never used smokeless tobacco. He reports that he does not currently use alcohol. He reports that he does not use drugs.   Allergies  Allergen Reactions   Zestril [Lisinopril] Cough   Hydrocodone Nausea Only   Januvia [Sitagliptin] Other (See Comments)    Unknown   Levitra [Vardenafil] Other (See Comments)    flushing   Prednisone Other (See Comments)    Elevates BGL; patient prefers to not take this   Victoza [Liraglutide] Other (See Comments)    GI upset   Penicillins Rash and Other (See Comments)    Rash around  ankles Has patient had a PCN reaction causing immediate rash, facial/tongue/throat swelling, SOB or lightheadedness with hypotension: Yes Has patient had a PCN reaction causing severe rash involving mucus membranes or skin necrosis: Unk Has patient had a PCN reaction that required hospitalization: No Has patient had a PCN reaction occurring within the last 10 years: No If all of the above answers are "NO", then may proceed with Cephalosporin use.     Family History  Problem Relation Age of Onset   Dementia Mother    Stroke Brother     Family history reviewed and not pertinent    Prior to Admission medications   Medication Sig Start Date End Date Taking?  Authorizing Provider  acetaminophen (TYLENOL) 650 MG CR tablet Take 1,300 mg by mouth in the morning, at noon, and at bedtime.    [provider]  allopurinol (ZYLOPRIM) 100 MG tablet Take 100 mg by mouth in the morning.    [provider]  aspirin EC 81 MG tablet Take 81 mg by mouth daily with supper.    [provider]  atorvastatin (LIPITOR) 40 MG tablet Take 40 mg by mouth at bedtime.    [provider]  CALCITRIOL PO Take 2 capsules by mouth every Monday, Wednesday, and Friday with hemodialysis.    [provider]  finasteride (PROSCAR) 5 MG tablet Take 5 mg by mouth daily with supper.    [provider]  gabapentin (NEURONTIN) 300 MG capsule Take 1 capsule (300 mg total) by mouth at bedtime. 06/09/22   Lars Mage, PA-C  insulin aspart protamine - aspart (NOVOLOG MIX 70/30 FLEXPEN) (70-30) 100 UNIT/ML FlexPen Inject 14-20 Units into the skin daily as needed (high blood sugar). If BS is <150=0 units, If BS is 150-200 units=14 units, If BS is >201=20 units    [provider]  IRON SUCROSE IV Inject into the vein as needed. At dialysis    [provider]  Methoxy PEG-Epoetin Beta (MIRCERA IJ) Mircera 05/15/22 05/14/23  [provider]  metoprolol succinate (TOPROL-XL) 50 MG 24 hr tablet Take 1 tablet (50 mg total) by mouth daily. Take with or immediately following a meal. 06/20/22   Patwardhan, Manish J, MD  tamsulosin (FLOMAX) 0.4 MG CAPS capsule Take 1 capsule (0.4 mg total) by mouth daily after supper. Patient taking differently: Take 0.4 mg by mouth at bedtime. 07/11/20   Albertine Grates, MD  traMADol (ULTRAM) 50 MG tablet Take 1 tablet (50 mg total) by mouth every 6 (six) hours as needed. Patient not taking: Reported on 06/20/2022 05/02/22   Emilie Rutter, PA-C     Objective    Physical Exam: Vitals:   12/18/22 0100 12/18/22 0129 12/18/22 0130 12/18/22 0403  BP: (!) 106/58  116/76 126/73  Pulse: 77 73 76  77  Resp: (!) 26 (!) 23 (!) 21 20  Temp:   97.9 F (36.6 C) 97.8 F (36.6 C)  TempSrc:   Oral Oral  SpO2: 100% 96% 92% 100%  Weight:   73.4 kg   Height:        General: appears to be stated age; alert, oriented; mildly increased work of breathing noted Skin: warm, dry, no rash Head:  AT/[place] Mouth:  Oral mucosa membranes appear moist, normal dentition Neck: supple; trachea midline Heart:  RRR; did not appreciate any M/R/G Lungs: CTAB, did not appreciate any wheezes, rales, or rhonchi Abdomen: + BS; soft, ND, NT Vascular: 2+ pedal pulses b/l; 2+ radial pulses b/l Extremities:  no peripheral edema, no muscle wasting Neuro: strength and sensation intact in upper and lower extremities b/l     Labs on Admission: I have personally reviewed following labs and imaging studies  CBC: Recent Labs  Lab 12/17/22 1920  WBC 9.8  HGB 11.4*  HCT 33.5*  MCV 98.0  PLT 182   Basic Metabolic Panel: Recent Labs  Lab 12/17/22 1920  NA 126*  K 5.4*  CL 89*  CO2 24  GLUCOSE 165*  BUN 55*  CREATININE 4.50*  CALCIUM 9.7   GFR: Estimated Creatinine Clearance: 11.2 mL/min (A) (by C-G formula based on SCr of 4.5 mg/dL (H)). Liver Function Tests: Recent Labs  Lab 12/17/22 1920  AST 58*  ALT 50*  ALKPHOS 347*  BILITOT 1.1  PROT 7.4  ALBUMIN 3.6   No results for input(s): "LIPASE", "AMYLASE" in the last 168 hours. No results for input(s): "AMMONIA" in the last 168 hours. Coagulation Profile: No results for input(s): "INR", "PROTIME" in the last 168 hours. Cardiac Enzymes: No results for input(s): "CKTOTAL", "CKMB", "CKMBINDEX", "TROPONINI" in the last 168 hours. BNP (last 3 results) No results for input(s): "PROBNP" in the last 8760 hours. HbA1C: No results for input(s): "HGBA1C" in the last 72 hours. CBG: Recent Labs  Lab 12/18/22 0604  GLUCAP 147*   Lipid Profile: No results for input(s): "CHOL", "HDL", "LDLCALC", "TRIG", "CHOLHDL", "LDLDIRECT" in the last 72  hours. Thyroid Function Tests: No results for input(s): "TSH", "T4TOTAL", "FREET4", "T3FREE", "THYROIDAB" in the last 72 hours. Anemia Panel: No results for input(s): "VITAMINB12", "FOLATE", "FERRITIN", "TIBC", "IRON", "RETICCTPCT" in the last 72 hours. Urine analysis:    Component Value Date/Time   COLORURINE STRAW (A) 07/10/2020 0814   APPEARANCEUR CLEAR 07/10/2020 0814   LABSPEC 1.010 07/10/2020 0814   PHURINE 6.0 07/10/2020 0814   GLUCOSEU 50 (A) 07/10/2020 0814   HGBUR SMALL (A) 07/10/2020 0814   BILIRUBINUR NEGATIVE 07/10/2020 0814   KETONESUR NEGATIVE 07/10/2020 0814   PROTEINUR >=300 (A) 07/10/2020 0814   UROBILINOGEN 0.2 04/17/2013 0828   NITRITE NEGATIVE 07/10/2020 0814   LEUKOCYTESUR NEGATIVE 07/10/2020 0814    Radiological Exams on Admission: DG Chest Port 1 View  Result Date: 12/17/2022 CLINICAL DATA:  Shortness of breath EXAM: PORTABLE CHEST 1 VIEW COMPARISON:  10/16/2021 FINDINGS: Cardiomegaly. Linear atelectasis or scarring in the left mid lung. Right basilar platelike atelectasis or infiltrate. Possible small right pleural effusion. No overt edema. No acute bony abnormality. IMPRESSION: Cardiomegaly. Right base atelectasis or infiltrate. Left mid lung atelectasis or scarring. Electronically Signed   By: Charlett Nose M.D.   On: 12/17/2022 19:38      Assessment/Plan   Principal Problem:   Acute hypoxic respiratory failure (HCC) Active Problems:   DM2 (diabetes mellitus, type 2) (HCC)   Hyperlipidemia   CAP (community acquired pneumonia)   Acute on chronic combined systolic and diastolic CHF (congestive heart failure) (HCC)   History of anemia due to chronic kidney disease   End-stage renal disease on hemodialysis (HCC)   Severe sepsis (HCC)   Acute hyponatremia   Hyperkalemia      #) Severe sepsis due to community-acquired pneumonia: Diagnosis on the basis of 4 to 5 days of new onset productive cough, shortness of breath, subjective fever, chills,  presenting hypothermia, with presenting chest x-ray showing evidence of right lower lobe infiltrate consistent with pneumonia.  SIRS criteria met via presenting hypothermia, tachypnea. Lactic acid level: Pending. Of note, given the associated presence of suspected end organ damage in  the form of concominant presenting acute hypoxic respiratory failure, criteria are met for pt's sepsis to be considered severe in nature. However, in the absence of lactic acid level that is greater than or equal to 4.0, and in the absence of any associated hypotension refractory to IVF's, there are no indications for administration of a 30 mL/kg IVF bolus at this time.   Additional ED work-up/management notable for: Initiation of azithromycin and Rocephin, which will be continued as empiric coverage for community-acquired pneumonia.  No e/o additional infectious process at this time, including COVID, influenza, RSV PCR all negative.   Plan: CBC w/ diff and CMP in AM.  Check lactic acid level.  Blood cultures x 2.  Continue azithromycin and Rocephin.  Add on procalcitonin level.  Check strep pneumonia urine antigen noting that the patient does continue to produce a small amount of urine in spite of his incisional disease.  Incentive [place].  Flutter valve.            #) Acute on chronic systolic/diastolic heart failure: dx of acute decompensation on the basis of presenting to 3 days of progressive shortness of breath associate with orthopnea, PND, worsening peripheral edema. This is in the context of a known history of chronic systolic/diastolic heart failure, with most recent echocardiogram performed February 2023, which is notable for LVEF 30 to 35% as well as grade 2 diastolic dysfunction with additional results as conveyed above.   Etiology leading to presenting acutely decompensated heart failure: Given preceding productive cough, subjective fever/chills, suspect that this acute on chronic  systolic/diastolic heart failure represents decussation as a consequence of primary severe sepsis due to right lower lobe pneumonia in particular given the sequence of development of the above symptoms..  Of note, EDP at [facility] discussed patient's case with On-call nephrology, who conveyed that they will formally consult, see the patient today, and plan for hemodialysis this morning.  Overall, ACS leading to presenting acutely decompensated heart failure appears less likely at this time in the absence of any recent CP, presenting EKG showing no evidence of acute ischemic changes.  Of note, given borderline soft blood pressures, with plan for ensuing hemodialysis, will hold next dose of beta-blocker for now.   Plan: monitor strict I's & O's and daily weights. Monitor on telemetry. Monitor continuous pulse oximetry. Repeat CMP in the morning. Add-on serum magnesium level, and repeat this level in the AM.  Nephrology to formally consult, planning on hemodialysis this morning, as further detailed above.  Further evaluation management of presenting severe sepsis due to community-acquired pneumonia as driving force, as further detailed above.              #) Acute hypoxic respiratory failure: In the context of no known baseline supplemental oxygen requirements, initial oxygen saturation is noted to be in the high 80s on room air, Sosan improving into the mid to high 90s on 4 L nasal cannula, which, in the context of acute shortness of breath, appears to be criteria for acute Evoxac respiratory failure.  Appears to be multifactorial in nature, with contributions from severe sepsis due to community-acquired pneumonia as well as secondary development of acute on chronic systolic/diastolic heart failure, as further detailed above.  Clinically, he pulmonary embolism appears less likely.  ACS also appears less likely, as further detailed above.  Plan: Further evaluation management of presenting serum  sepsis due to community-acquired pneumonia as well as further evaluation management of presenting acute on chronic systolic/diastolic heart failure, as above,  including plan for hemodialysis today.  Check serum magnesium and phosphorus levels.  Monitor continuous oximetry.  CMP, CBC in the morning.  Add on procalcitonin level.  In spirometry.              #) Acute hypoosmolar hyponatremia: As quantified above, this is suspected to be multifactorial in etiology, with contribution from hypervolemia as a consequence of presenting acute on chronic systolic/diastolic heart failure, as well as potential for contribution from SIADH, given suspected acute pulmonary infection in the form of right lower lobe community-acquired pneumonia, as above.  Will attend to the potential SIADH component via IV antibiotics as treatment for commune acquired pneumonia, and attention to the suspected element of acute volume overload in the setting of acute on chronic systolic/diastolic heart failure the plan for hemodialysis in the morning via formal nephrology consultation, as further detailed above.  Plan: Monitor strict I's and O's and daily weights.  Further evaluation and management of spasticity acute endocrine, as above.  Nephrology consulted, plan for hemodialysis today.  Repeat CMP in the morning.                     #) Hyperkalemia: Mildly elevated serum potassium level 5.4 no evidence of concomitant EKG findings.  This should be definitively managed via existing plan for hemodialysis this morning.  Plan: Nephrology consulted, plan for modalities this morning.  Repeat CMP in the morning.  Monitor on symmetry.                  #) ESRD: on HD (schedule: Monday, Wednesday, Friday). Next due for routine HD on Monday, 12/18/2022. No clinical evidence for urgent overnight HD or to expedite HD relative to this timeframe.  EDP at [facility] discussed patient's case with the on-call  nephrologist, Dr. Neita Goodnight*, who will formally consult, and is planning for hemodialysis on the morning of 12/18/2022.   Plan: monitor strict I's/O's, daily weights. CMP in the AM. Check mag and phos levels.  Nephrology consult, with plan for hemodialysis this morning (6/17).                       #) Type 2 Diabetes Mellitus: documented history of such.  Appears to be managed via lifestyle modifications as an outpatient, in the absence of any insulin or oral hypoglycemic agents.  Most recent hemoglobin A1c level was noted to be 8% when checked in December 2023.  Presenting blood sugar 165.  Appears complicated by diabetic peripheral polyneuropathy, for which the patient is on gabapentin at home.  Plan: accuchecks QAC and HS with low dose SSI.  Add on hemoglobin A1c level.  Resume outpatient gabapentin.                #) Hyperlipidemia: documented h/o such. On high intensity atorvastatin as outpatient.   Plan: continue home statin.                 #) Anemia of chronic disease: Documented history of such, a/w with baseline hgb range 9-12, with presenting hgb consistent with this range, in the absence of any overt evidence of active bleed.     Plan: Repeat CBC in the morning.      DVT prophylaxis: SCD's   Code Status: Full code Family Communication: none Disposition Plan: Per Rounding Team Consults called: [facility] EDP discussed patient's case with on-call nephrology, Dr. Arlean Hopping, Who will formally consult, as further detailed above;  Admission status: Inpatient  I SPENT GREATER THAN 75  MINUTES IN CLINICAL CARE TIME/MEDICAL DECISION-MAKING IN COMPLETING THIS ADMISSION.      Chaney Born Genora Arp DO [facility]  From 7PM - 7AM   12/18/2022, 6:10 AM

## 2022-12-18 NOTE — Progress Notes (Signed)
Brief same day note:  Patient is a 82 year old male with history of ESRD on dialysis on MWF schedule,, systolic/diastolic CHF, type 2 diabetes, hyperlipidemia, anemia of chronic disease who presented here with complaint of shortness of breath, cough, subjective fever/chills, myalgia from home. On presentation, he was hemodynamically stable but hypoxic on room air and was put on oxygen.  Lab work showed sodium of 126, potassium of 5.4.  COVID/flu/influenza negative.  Chest x-ray showed right lower lobe infiltrate consistent with pneumonia.  Patient was started on antibiotics for community-acquired pneumonia.  Nephrology consulted for dialysis.  Patient seen and examined at bedside this morning.  Given evaluation, he was on 3 to 4 L of oxygen, complains of some shortness of breath.  Assessment and plan:  Sepsis secondary to community-acquired pneumonia: Presented with fever, chills, shortness of breath, cough.  Hypothermic and presentation.  Chest x-ray showed right lower lobe infiltrate.  Started on antibiotics for community-acquired pneumonia.  Follow-up cultures.  He is afebrile.  Sepsis physiology has improved.  Acute on chronic Combined systolic/diastolic CHF: Echo as per February 2023 shows EF of 30 to 35%, grade 2 diastolic dysfunction, normal right ventricular function.  Currently in exacerbation most likely secondary to volume overload.  Has bilateral crackles, lower extremity edema.  Volume management as per dialysis.  Acute hypoxic respiratory failure: Secondary to pneumonia and also from possible volume overload.  Hopefully oxygenation will improve with antibiotics and dialysis.  Hyponatremia/hyperkalemia: Likely from CHF/volume overload.  Monitor.  Expect improvement in the sodium level after dialysis  ESRD on dialysis: Dialyzed on Monday, Wednesday, Friday, nephrology following for dialysis  Type 2 diabetes: Recent A1c of 8%.  Continue current insulin regimen.  Hyperlipidemia:  Continue Lipitor  Anemia of chronic disease: Currently hemoglobin stable  I called his spouse on phone today for update,call not received

## 2022-12-18 NOTE — ED Notes (Signed)
Dr. Read Drivers made aware that patient's BP now trending in the 80/59. Patient called out complaining that he had to have a bowel movement and was bearing down, however assisted to bedside commode and had BM and BP still remaining in the 80s. Patient complaining of SHOB and comfortable sitting on the side of the bed, O2 moved up to 5 L per [place] as patient was briefly hypoxic however improved when increased to 5 L. No new orders received at this time by Dr. Read Drivers.

## 2022-12-19 DIAGNOSIS — D125 Benign neoplasm of sigmoid colon: Secondary | ICD-10-CM | POA: Diagnosis not present

## 2022-12-19 DIAGNOSIS — K5289 Other specified noninfective gastroenteritis and colitis: Secondary | ICD-10-CM | POA: Diagnosis not present

## 2022-12-19 DIAGNOSIS — J9601 Acute respiratory failure with hypoxia: Secondary | ICD-10-CM | POA: Diagnosis not present

## 2022-12-19 LAB — CBC
HCT: 31.1 % — ABNORMAL LOW (ref 39.0–52.0)
Hemoglobin: 10.5 g/dL — ABNORMAL LOW (ref 13.0–17.0)
MCH: 33.5 pg (ref 26.0–34.0)
MCHC: 33.8 g/dL (ref 30.0–36.0)
MCV: 99.4 fL (ref 80.0–100.0)
Platelets: 142 10*3/uL — ABNORMAL LOW (ref 150–400)
RBC: 3.13 MIL/uL — ABNORMAL LOW (ref 4.22–5.81)
RDW: 15.8 % — ABNORMAL HIGH (ref 11.5–15.5)
WBC: 8.3 10*3/uL (ref 4.0–10.5)
nRBC: 0 % (ref 0.0–0.2)

## 2022-12-19 LAB — BASIC METABOLIC PANEL
Anion gap: 13 (ref 5–15)
BUN: 30 mg/dL — ABNORMAL HIGH (ref 8–23)
CO2: 28 mmol/L (ref 22–32)
Calcium: 8.7 mg/dL — ABNORMAL LOW (ref 8.9–10.3)
Chloride: 91 mmol/L — ABNORMAL LOW (ref 98–111)
Creatinine, Ser: 3.1 mg/dL — ABNORMAL HIGH (ref 0.61–1.24)
GFR, Estimated: 19 mL/min — ABNORMAL LOW (ref >60)
Glucose, Bld: 195 mg/dL — ABNORMAL HIGH (ref 70–99)
Potassium: 3.9 mmol/L (ref 3.5–5.1)
Sodium: 132 mmol/L — ABNORMAL LOW (ref 135–145)

## 2022-12-19 LAB — HEPATITIS B SURFACE ANTIBODY, QUANTITATIVE: Hep B S AB Quant (Post): 874 m[IU]/mL (ref >9.9)

## 2022-12-19 LAB — GLUCOSE, CAPILLARY
Glucose-Capillary: 139 mg/dL — ABNORMAL HIGH (ref 70–99)
Glucose-Capillary: 197 mg/dL — ABNORMAL HIGH (ref 70–99)
Glucose-Capillary: 111 mg/dL — ABNORMAL HIGH (ref 70–99)
Glucose-Capillary: 252 mg/dL — ABNORMAL HIGH (ref 70–99)

## 2022-12-19 NOTE — Progress Notes (Signed)
PROGRESS NOTE  Robert Lucas  ZOX:[id number] DOB: [birth year] DOA: 12/17/2022 PCP: Joycelyn Rua, MD   Brief Narrative: Patient is a 82 year old male with history of ESRD on dialysis on MWF schedule,, systolic/diastolic CHF, type 2 diabetes, hyperlipidemia, anemia of chronic disease who presented here with complaint of shortness of breath, cough, subjective fever/chills, myalgia from home. On presentation, he was hemodynamically stable but hypoxic on room air and was put on oxygen.  Lab work showed sodium of 126, potassium of 5.4.  COVID/flu/influenza negative.  Chest x-ray showed right lower lobe infiltrate consistent with pneumonia.  Patient was started on antibiotics for community-acquired pneumonia.  Nephrology consulted for dialysis.  Assessment & Plan:  Principal Problem:   Acute hypoxic respiratory failure (HCC) Active Problems:   DM2 (diabetes mellitus, type 2) (HCC)   Hyperlipidemia   CAP (community acquired pneumonia)   Acute on chronic combined systolic and diastolic CHF (congestive heart failure) (HCC)   History of anemia due to chronic kidney disease   End-stage renal disease on hemodialysis (HCC)   Severe sepsis (HCC)   Acute hyponatremia   Hyperkalemia   Sepsis secondary to community-acquired pneumonia: Presented with fever, chills, shortness of breath, cough.  Hypothermic and presentation.  Chest x-ray showed right lower lobe infiltrate.  Started on antibiotics for community-acquired pneumonia.  Follow-up cultures.  He is afebrile.  Sepsis physiology has improved.  He is afebrile, no leukocytosis.  Cultures have been negative so far.   Acute on chronic Combined systolic/diastolic CHF: Echo as per February 2023 shows EF of 30 to 35%, grade 2 diastolic dysfunction, normal right ventricular function.  Currently in exacerbation most likely secondary to volume overload.  Has bilateral crackles, significant lower extremity edema.  Volume management as per dialysis.   Acute  hypoxic respiratory failure: Secondary to pneumonia and also from possible volume overload.  Hopefully oxygenation will improve with antibiotics and dialysis.  On 2 L this morning.   Hyponatremia/hyperkalemia: Likely from CHF/volume overload.  Monitor.  Expect further improvement in the sodium level after dialysis   ESRD on dialysis: Dialyzed on Monday, Wednesday, Friday, nephrology following for dialysis.  Plan for dialysis today.  Has AV fistula on the left upper extremity   Type 2 diabetes: Recent A1c of 8%.  Continue current insulin regimen.   Hyperlipidemia: Continue Lipitor   Anemia of chronic disease: Currently hemoglobin stable          DVT prophylaxis:SCDs Start: 12/18/22 0302     Code Status: Full Code  Family Communication: Called and discussed with daughter Dois Davenport on phone on 6/18  Patient status: Inpatient  Patient is from : Home  Anticipated discharge to: Home  Estimated DC date: 1 to 2 days   Consultants: Nephrology  Procedures: Dialysis  Antimicrobials:  Anti-infectives (From admission, onward)    Start     Dose/Rate Route Frequency Ordered Stop   12/18/22 1800  azithromycin (ZITHROMAX) 500 mg in sodium chloride 0.9 % 250 mL IVPB        500 mg 250 mL/hr over 60 Minutes Intravenous Every 24 hours 12/18/22 0312     12/18/22 1800  cefTRIAXone (ROCEPHIN) 1 g in sodium chloride 0.9 % 100 mL IVPB  Status:  Discontinued        1 g 200 mL/hr over 30 Minutes Intravenous Every 24 hours 12/18/22 0312 12/18/22 0754   12/18/22 1800  cefTRIAXone (ROCEPHIN) 2 g in sodium chloride 0.9 % 100 mL IVPB        2 g 200  mL/hr over 30 Minutes Intravenous Every 24 hours 12/18/22 0754     12/17/22 2215  cefTRIAXone (ROCEPHIN) 1 g in sodium chloride 0.9 % 100 mL IVPB        1 g 200 mL/hr over 30 Minutes Intravenous  Once 12/17/22 2203 12/17/22 2251   12/17/22 2215  azithromycin (ZITHROMAX) 500 mg in sodium chloride 0.9 % 250 mL IVPB        500 mg 250 mL/hr over 60 Minutes  Intravenous  Once 12/17/22 2203 12/18/22 0014       Subjective: Patient seen and examined at bedside today.  He feels much better today.  He still on 2 L of oxygen.  He has bilateral lower extremity edema which is significant.  Still has some bilateral basilar crackles.  Not short of breath today.  Objective: Vitals:   12/19/22 0805 12/19/22 0941 12/19/22 1049 12/19/22 1053  BP:  135/62  136/65  Pulse: 89 84 86 83  Resp:    18  Temp:    98.6 F (37 C)  TempSrc:    Oral  SpO2: 100% 100% 100% 100%  Weight:      Height:        Intake/Output Summary (Last 24 hours) at 12/19/2022 1112 Last data filed at 12/19/2022 1000 Gross per 24 hour  Intake 486.24 ml  Output 3000 ml  Net -2513.76 ml   Filed Weights   12/18/22 1254 12/18/22 1711 12/19/22 0426  Weight: 76.2 kg 72.8 kg 73 kg    Examination:  General exam: Overall comfortable, not in distress HEENT: PERRL Respiratory system: Bilateral basilar crackles  cardiovascular system: S1 & S2 heard, RRR.  Gastrointestinal system: Abdomen is nondistended, soft and nontender. Central nervous system: Alert and oriented Extremities: 2-3+ bilateral lower extremity pitting edema, no clubbing ,no cyanosis, AV fistula in the left upper extremity Skin: No rashes, no ulcers,no icterus     Data Reviewed: I have personally reviewed following labs and imaging studies  CBC: Recent Labs  Lab 12/17/22 1920 12/18/22 0727 12/19/22 0043  WBC 9.8 11.3* 8.3  NEUTROABS  --  8.9*  --   HGB 11.4* 11.2* 10.5*  HCT 33.5* 33.6* 31.1*  MCV 98.0 98.5 99.4  PLT 182 141* 142*   Basic Metabolic Panel: Recent Labs  Lab 12/17/22 1920 12/18/22 0726 12/19/22 0043  NA 126* 127* 132*  K 5.4* 5.5* 3.9  CL 89* 90* 91*  CO2 24 20* 28  GLUCOSE 165* 146* 195*  BUN 55* 61* 30*  CREATININE 4.50* 4.73* 3.10*  CALCIUM 9.7 8.9 8.7*     Recent Results (from the past 240 hour(s))  Resp panel by RT-PCR (RSV, Flu A&B, Covid) Anterior Nasal Swab     Status:  None   Collection Time: 12/17/22  7:20 PM   Specimen: Anterior Nasal Swab  Result Value Ref Range Status   SARS Coronavirus 2 by RT PCR NEGATIVE NEGATIVE Final    Comment: (NOTE) SARS-CoV-2 target nucleic acids are NOT DETECTED.  The SARS-CoV-2 RNA is generally detectable in upper respiratory specimens during the acute phase of infection. The lowest concentration of SARS-CoV-2 viral copies this assay can detect is 138 copies/mL. A negative result does not preclude SARS-Cov-2 infection and should not be used as the sole basis for treatment or other patient management decisions. A negative result may occur with  improper specimen collection/handling, submission of specimen other than nasopharyngeal swab, presence of viral mutation(s) within the areas targeted by this assay, and inadequate number of viral  copies(<138 copies/mL). A negative result must be combined with clinical observations, patient history, and epidemiological information. The expected result is Negative.  Fact Sheet for Patients:  BloggerCourse.com  Fact Sheet for Healthcare Providers:  SeriousBroker.it  This test is no t yet approved or cleared by the [place] FDA and  has been authorized for detection and/or diagnosis of SARS-CoV-2 by FDA under an Emergency Use Authorization (EUA). This EUA will remain  in effect (meaning this test can be used) for the duration of the COVID-19 declaration under Section 564(b)(1) of the Act, 21 U.S.C.section 360bbb-3(b)(1), unless the authorization is terminated  or revoked sooner.       Influenza A by PCR NEGATIVE NEGATIVE Final   Influenza B by PCR NEGATIVE NEGATIVE Final    Comment: (NOTE) The Xpert Xpress SARS-CoV-2/FLU/RSV plus assay is intended as an aid in the diagnosis of influenza from Nasopharyngeal swab specimens and should not be used as a sole basis for treatment. Nasal washings and aspirates are unacceptable  for Xpert Xpress SARS-CoV-2/FLU/RSV testing.  Fact Sheet for Patients: BloggerCourse.com  Fact Sheet for Healthcare Providers: SeriousBroker.it  This test is not yet approved or cleared by the [place] FDA and has been authorized for detection and/or diagnosis of SARS-CoV-2 by FDA under an Emergency Use Authorization (EUA). This EUA will remain in effect (meaning this test can be used) for the duration of the COVID-19 declaration under Section 564(b)(1) of the Act, 21 U.S.C. section 360bbb-3(b)(1), unless the authorization is terminated or revoked.     Resp Syncytial Virus by PCR NEGATIVE NEGATIVE Final    Comment: (NOTE) Fact Sheet for Patients: BloggerCourse.com  Fact Sheet for Healthcare Providers: SeriousBroker.it  This test is not yet approved or cleared by the [place] FDA and has been authorized for detection and/or diagnosis of SARS-CoV-2 by FDA under an Emergency Use Authorization (EUA). This EUA will remain in effect (meaning this test can be used) for the duration of the COVID-19 declaration under Section 564(b)(1) of the Act, 21 U.S.C. section 360bbb-3(b)(1), unless the authorization is terminated or revoked.  Performed at [facility], [address] [place], [place] [address]   Culture, blood (Routine X 2) w Reflex to ID Panel     Status: None (Preliminary result)   Collection Time: 12/18/22  7:02 AM   Specimen: BLOOD RIGHT ARM  Result Value Ref Range Status   Specimen Description BLOOD RIGHT ARM  Final   Special Requests   Final    BOTTLES DRAWN AEROBIC ONLY Blood Culture results may not be optimal due to an inadequate volume of blood received in culture bottles   Culture   Final    NO GROWTH < 24 HOURS Performed at [facility], [address], [place], [place] [address]    Report Status PENDING  Incomplete  Culture,  blood (Routine X 2) w Reflex to ID Panel     Status: None (Preliminary result)   Collection Time: 12/18/22  7:16 AM   Specimen: BLOOD RIGHT HAND  Result Value Ref Range Status   Specimen Description BLOOD RIGHT HAND  Final   Special Requests   Final    BOTTLES DRAWN AEROBIC ONLY Blood Culture results may not be optimal due to an inadequate volume of blood received in culture bottles   Culture   Final    NO GROWTH < 24 HOURS Performed at [facility], [address], [place], [place] [address]    Report Status PENDING  Incomplete     Radiology Studies: DG Chest Port 1 View  Result Date: 12/17/2022 CLINICAL DATA:  Shortness of breath EXAM: PORTABLE CHEST 1 VIEW COMPARISON:  10/16/2021 FINDINGS: Cardiomegaly. Linear atelectasis or scarring in the left mid lung. Right basilar platelike atelectasis or infiltrate. Possible small right pleural effusion. No overt edema. No acute bony abnormality. IMPRESSION: Cardiomegaly. Right base atelectasis or infiltrate. Left mid lung atelectasis or scarring. Electronically Signed   By: Charlett Nose M.D.   On: 12/17/2022 19:38    Scheduled Meds:  aspirin EC  81 mg Oral Q supper   atorvastatin  40 mg Oral QHS   calcitRIOL  0.25 mcg Oral Q M,W,F-HD   Chlorhexidine Gluconate Cloth  6 each Topical Q0600   heparin sodium (porcine)  4,000 Units Intracatheter Once   insulin aspart  0-6 Units Subcutaneous TID WC   Continuous Infusions:  azithromycin 250 mL/hr at 12/18/22 1900   cefTRIAXone (ROCEPHIN)  IV 2 g (12/18/22 1907)     LOS: 1 day   Burnadette Pop, MD [facility] P6/18/2024, 11:12 AM

## 2022-12-19 NOTE — Progress Notes (Signed)
[facility] Progress Note   Assessment/ Plan:    NW MWF HD 4h  71kg  3K/2.5Ca bath  400/1.5   R AVG  Heparin 4000  Assessment/Plan: 1 Acute hypoxic RF: likely combo of vol overload and CAP, getting antibiotics.  HD yesterday and again today off schedule 6/18- have ordered a sequential rx 2 ESRD: MWF [facility],  Sequential today, then back to normal schedule in AM.   3 Hypertension: only on toprol as OP 4. Anemia of ESRD: Hgb 11.2, no ESA needed at present 5. Metabolic Bone Disease: BMM Ok.   6.  Dispo: pending    Subjective:    Seen in room.  3L off in HD yesterday but still has LE edema and crackles.  For another HD session today.     Objective:   BP 136/65 (BP Location: Right Arm)   Pulse 92   Temp 98.6 F (37 C) (Oral)   Resp 18   Ht 5\' 3"  (1.6 m)   Wt 73 kg   SpO2 100%   BMI 28.51 kg/m   Physical Exam: GEN NAD, sitting up in chair HEENT EOMI PERRL NECK + JVD, improved PULM bibasilar crackles still persist LLL CV RRR ABD soft EXT2+ LE edema to mid-shin now, can see some skin texture NEURO AAO x 3 ACCESS AVG + bruit  Labs: BMET Recent Labs  Lab 12/17/22 1920 12/18/22 0726 12/19/22 0043  NA 126* 127* 132*  K 5.4* 5.5* 3.9  CL 89* 90* 91*  CO2 24 20* 28  GLUCOSE 165* 146* 195*  BUN 55* 61* 30*  CREATININE 4.50* 4.73* 3.10*  CALCIUM 9.7 8.9 8.7*   CBC Recent Labs  Lab 12/17/22 1920 12/18/22 0727 12/19/22 0043  WBC 9.8 11.3* 8.3  NEUTROABS  --  8.9*  --   HGB 11.4* 11.2* 10.5*  HCT 33.5* 33.6* 31.1*  MCV 98.0 98.5 99.4  PLT 182 141* 142*      Medications:     aspirin EC  81 mg Oral Q supper   atorvastatin  40 mg Oral QHS   calcitRIOL  0.25 mcg Oral Q M,W,F-HD   Chlorhexidine Gluconate Cloth  6 each Topical Q0600   heparin sodium (porcine)  4,000 Units Intracatheter Once   insulin aspart  0-6 Units Subcutaneous TID WC     Bufford Buttner MD 12/19/2022, 11:48 AM

## 2022-12-19 NOTE — Progress Notes (Signed)
   12/19/22 0941  Vitals  BP 135/62  MAP (mmHg) 84  Pulse Rate 84  ECG Heart Rate 84  Oxygen Therapy  SpO2 100 %  MEWS Score  MEWS Temp 0  MEWS Systolic 0  MEWS Pulse 0  MEWS RR 0  MEWS LOC 0  MEWS Score 0  MEWS Score Color Green     Telemetry called regarding a six beat run of [place], Md informed. VSS, denies any chest pain.

## 2022-12-19 NOTE — Progress Notes (Signed)
   12/19/22 1531  Vitals  Temp 97.6 F (36.4 C)  Pulse Rate 81  Resp 18  BP (!) 153/80  SpO2 99 %  O2 Device Nasal Cannula  Weight 72.3 kg  Type of Weight Post-Dialysis  Oxygen Therapy  O2 Flow Rate (L/min) 2 L/min  Patient Activity (if Appropriate) In bed  Pulse Oximetry Type Continuous  Oximetry Probe Site Changed No   Received patient in bed to unit.  Alert and oriented.  Informed consent signed and in chart.   TX duration:3   Patient tolerated well.  Transported back to the room  Alert, without acute distress.  Hand-off given to patient's nurse.   Access used: LUAF Access issues: no complications  Total UF removed: 3000 Medication(s) given: none   [facility]

## 2022-12-20 ENCOUNTER — Other Ambulatory Visit (HOSPITAL_COMMUNITY): Payer: Self-pay

## 2022-12-20 DIAGNOSIS — J9601 Acute respiratory failure with hypoxia: Secondary | ICD-10-CM | POA: Diagnosis not present

## 2022-12-20 LAB — BASIC METABOLIC PANEL
Anion gap: 14 (ref 5–15)
BUN: 26 mg/dL — ABNORMAL HIGH (ref 8–23)
CO2: 24 mmol/L (ref 22–32)
Calcium: 8.5 mg/dL — ABNORMAL LOW (ref 8.9–10.3)
Chloride: 90 mmol/L — ABNORMAL LOW (ref 98–111)
Creatinine, Ser: 2.92 mg/dL — ABNORMAL HIGH (ref 0.61–1.24)
GFR, Estimated: 21 mL/min — ABNORMAL LOW (ref >60)
Glucose, Bld: 141 mg/dL — ABNORMAL HIGH (ref 70–99)
Potassium: 4.2 mmol/L (ref 3.5–5.1)
Sodium: 128 mmol/L — ABNORMAL LOW (ref 135–145)

## 2022-12-20 LAB — GLUCOSE, CAPILLARY: Glucose-Capillary: 123 mg/dL — ABNORMAL HIGH (ref 70–99)

## 2022-12-20 LAB — CULTURE, BLOOD (ROUTINE X 2): Culture: NO GROWTH

## 2022-12-20 MED ORDER — AZITHROMYCIN 500 MG PO TABS
500.0000 mg | ORAL_TABLET | Freq: Every day | ORAL | 0 refills | Status: AC
  Filled 2022-12-20: qty 2, 2d supply, fill #0

## 2022-12-20 MED ORDER — CEFDINIR 300 MG PO CAPS
300.0000 mg | ORAL_CAPSULE | Freq: Two times a day (BID) | ORAL | 0 refills | Status: AC
  Filled 2022-12-20: qty 8, 4d supply, fill #0

## 2022-12-20 MED ORDER — HEPARIN SODIUM (PORCINE) 1000 UNIT/ML IJ SOLN
INTRAMUSCULAR | Status: AC
  Administered 2022-12-20: 4000 [IU]
  Filled 2022-12-20: qty 4

## 2022-12-20 NOTE — Progress Notes (Signed)
   12/20/22 1327  Vitals  BP (!) 140/72  MAP (mmHg) 87  BP Location Right Arm  BP Method Automatic  Patient Position (if appropriate) Lying  Pulse Rate 91  Pulse Rate Source Monitor  ECG Heart Rate 86  Resp 20  MEWS COLOR  MEWS Score Color Green  Oxygen Therapy  SpO2 98 %  O2 Device Room Air  Pain Assessment  Pain Scale 0-10  Pain Score 0  MEWS Score  MEWS Temp 0  MEWS Systolic 0  MEWS Pulse 0  MEWS RR 0  MEWS LOC 0  MEWS Score 0   Patient arrived back onto the unit from HD session. Patient is alert and oriented x 4 and complains of 0/10 pain. Patient is sitting on edge of bed, on the phone with family member. NT checked CBG is stable. Clothes at bedside. Discharge orders in. RN called TOC for medication update.

## 2022-12-20 NOTE — Progress Notes (Signed)
RN went over discharge instructions with patient at the bedside. NT removed PIV. Site is clean dry and intact. Tele monitor removed and CCMD notified. TOC meds at bedside. Patient is getting dressed. Transportation en route.

## 2022-12-20 NOTE — Plan of Care (Signed)
[facility] Patient Discharge Orders- [facility] CLINIC: [facility]  Patient's name: Robert Lucas Admit/DC Dates: 12/17/2022 - 12/20/22  Discharge Diagnoses: Acute hypoxic respiratory failure 2/2 volume overload/CAP  Acute/chronic combined CHF    Aranesp: Given: --   Date and amount of last dose: --  Last Hgb: 10.5 PRBC's Given: -- Date/# of units: -- ESA dose for discharge: Per clinic protocol  IV Iron dose at discharge: --  Heparin change: --  EDW Change: Yes   New EDW: 70 kg   Bath Change: ---  Access intervention/Change: --- Details:  Hectorol/Calcitriol change: --  Discharge Labs: Calcium 8.5 Phosphorus --   Albumin -- K+ 4.2  IV Antibiotics: --- Details:  On Coumadin?: -- Last INR: Next INR: Managed By:   OTHER/APPTS/LAB ORDERS:    D/C Meds to be reconciled by nurse after every discharge.  Completed By: Tomasa Blase PA-C [facility] 12/20/2022,2:37 PM   Reviewed by: MD:______ RN_______

## 2022-12-20 NOTE — Progress Notes (Addendum)
[facility] Progress Note   Assessment/ Plan:    NW MWF HD 4h  71kg  3K/2.5Ca bath  400/1.5   R AVG  Heparin 4000  Assessment/Plan: 1 Acute hypoxic RF: likely combo of vol overload and CAP, getting antibiotics.  HD today 6/19, back on regular schedule.  Has high IDWG- discussed 2 ESRD: MWF [facility],  Sequential today, then back to normal schedule in AM.   3 Hypertension: only on toprol as OP 4. Anemia of ESRD: Hgb 11.2, no ESA needed at present 5. Metabolic Bone Disease: BMM Ok.   6.  Dispo: could go from renal perspective today with resumption of normal OP schedule    Subjective:    Extra HD yesterday with another 3L off.  Did well.  HD on schedule today.  Off O2.     Objective:   BP (!) 144/67 (BP Location: Right Arm)   Pulse 75   Temp (!) 97.5 F (36.4 C) (Oral)   Resp 20   Ht 5\' 3"  (1.6 m)   Wt 73.9 kg   SpO2 94%   BMI 28.86 kg/m   Physical Exam: GEN NAD, lying flat in bed HEENT EOMI PERRL, No o2 today NECK no JVD PULM crackles better CV RRR ABD soft EXT trace LE edema to mid-shin now, can see some skin texture NEURO AAO x 3 ACCESS AVG + bruit  Labs: BMET Recent Labs  Lab 12/17/22 1920 12/18/22 0726 12/19/22 0043 12/20/22 0125  NA 126* 127* 132* 128*  K 5.4* 5.5* 3.9 4.2  CL 89* 90* 91* 90*  CO2 24 20* 28 24  GLUCOSE 165* 146* 195* 141*  BUN 55* 61* 30* 26*  CREATININE 4.50* 4.73* 3.10* 2.92*  CALCIUM 9.7 8.9 8.7* 8.5*   CBC Recent Labs  Lab 12/17/22 1920 12/18/22 0727 12/19/22 0043  WBC 9.8 11.3* 8.3  NEUTROABS  --  8.9*  --   HGB 11.4* 11.2* 10.5*  HCT 33.5* 33.6* 31.1*  MCV 98.0 98.5 99.4  PLT 182 141* 142*      Medications:     aspirin EC  81 mg Oral Q supper   atorvastatin  40 mg Oral QHS   calcitRIOL  0.25 mcg Oral Q M,W,F-HD   Chlorhexidine Gluconate Cloth  6 each Topical Q0600   insulin aspart  0-6 Units Subcutaneous TID WC     Bufford Buttner MD 12/20/2022, 11:01 AM

## 2022-12-20 NOTE — TOC Initial Note (Addendum)
Transition of Care [facility]) - Initial/Assessment Note    Patient Details  Name: Robert Lucas MRN: [id number] Date of Birth: 1940-10-15  Transition of Care [facility]) CM/SW Contact:    Leone Haven, RN Phone Number: 12/20/2022, 1:42 PM  Clinical Narrative:                 From home with wife, indep, , he has PCP and insurance on file.  He states he does not currently use any DME at home.  He does not have any HH services in place at this time, his support system is his wife and daughter.  Daughter, Dois Davenport will transport him home today, he gets his medications from [facility] in [place].  TOC to fill meds. Daughter will bring him clothes.  Expected Discharge Plan: Home/Self Care Barriers to Discharge: No Barriers Identified   Patient Goals and CMS Choice Patient states their goals for this hospitalization and ongoing recovery are:: return home   Choice offered to / list presented to : NA      Expected Discharge Plan and Services In-house Referral: NA Discharge Planning Services: CM Consult Post Acute Care Choice: NA Living arrangements for the past 2 months: Single Family Home Expected Discharge Date: 12/20/22               DME Arranged: N/A DME Agency: NA       HH Arranged: NA          Prior Living Arrangements/Services Living arrangements for the past 2 months: Single Family Home Lives with:: Spouse Patient language and need for interpreter reviewed:: Yes Do you feel safe going back to the place where you live?: Yes      Need for Family Participation in Patient Care: Yes (Comment) Care giver support system in place?: Yes (comment)   Criminal Activity/Legal Involvement Pertinent to Current Situation/Hospitalization: No - Comment as needed  Activities of Daily Living      Permission Sought/Granted                  Emotional Assessment Appearance:: Appears stated age Attitude/Demeanor/Rapport: Engaged Affect (typically observed):  Appropriate Orientation: : Oriented to Self, Oriented to Place, Oriented to  Time, Oriented to Situation Alcohol / Substance Use: Not Applicable Psych Involvement: No (comment)  Admission diagnosis:  Chills [R68.83] Hypoxia [R09.02] Hypervolemia, unspecified hypervolemia type [E87.70] Community acquired pneumonia of right lower lobe of lung [J18.9] Acute cough [R05.1] Acute hypoxic respiratory failure (HCC) [J96.01] Patient Active Problem List   Diagnosis Date Noted   Severe sepsis (HCC) 12/18/2022   Acute hyponatremia 12/18/2022   Hyperkalemia 12/18/2022   Acute hypoxic respiratory failure (HCC) 12/17/2022   Atrial tachycardia 06/20/2022   NSVT (nonsustained ventricular tachycardia) (HCC) 06/20/2022   End-stage renal disease on hemodialysis (HCC) 03/16/2022   Diabetes mellitus type 2 in obese 10/18/2021   Acute respiratory failure with hypoxia and hypercapnia (HCC)    Acute metabolic encephalopathy    Aspiration pneumonia of both lower lobes due to vomit (HCC)    OSA (obstructive sleep apnea) 10/14/2021   Obesity hypoventilation syndrome (HCC) 10/14/2021   Fluid overload 10/13/2021   Hypothermia 10/13/2021   ARF (acute renal failure) (HCC) 10/13/2021   CKD (chronic kidney disease), stage V (HCC) 09/21/2021   History of anemia due to chronic kidney disease 08/27/2021   Acute on chronic combined systolic and diastolic CHF (congestive heart failure) (HCC) 08/25/2021   Acute renal failure superimposed on stage 4 chronic kidney disease (HCC) 08/25/2021   Stage  3 chronic kidney disease due to type 2 diabetes mellitus (HCC) 07/01/2021   Age-related nuclear cataract, bilateral 07/01/2021   Allergic rhinitis 07/01/2021   Allergic rhinitis due to pollen 07/01/2021   Arthropathy 07/01/2021   Calculus of gallbladder without cholecystitis without obstruction 07/01/2021   Chronic kidney disease, stage 4 (severe) (HCC) 07/01/2021   Constipation 07/01/2021   Diabetic renal disease (HCC)  07/01/2021   Enlarged prostate 07/01/2021   Gallbladder calculus with acute cholecystitis and no obstruction 07/01/2021   Hypertensive heart disease without congestive heart failure 07/01/2021   Hypertrophy of prostate with urinary obstruction and other lower urinary tract symptoms (LUTS) 07/01/2021   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 associated with type 2 diabetes mellitus (HCC) 07/01/2021   Obstructive sleep apnea syndrome 07/01/2021   Old myocardial infarction 07/01/2021   Other specified counseling 07/01/2021   Postherpetic neuralgia 07/01/2021   Postsurgical percutaneous transluminal coronary angioplasty status 07/01/2021   Presence of aortocoronary bypass graft 07/01/2021   Proteinuria 07/01/2021   Senile purpura (HCC) 07/01/2021   Vitamin D deficiency 07/01/2021   HFrEF (heart failure with reduced ejection fraction) (HCC) 07/12/2020   Lobar pneumonia (HCC) 07/10/2020   CAP (community acquired pneumonia) 07/08/2020   Complication of surgical procedure 09/08/2019   Lumbar radiculopathy 09/08/2019   Degeneration of lumbar intervertebral disc 08/27/2019   Pain of left hip joint 07/17/2019   Obese 05/07/2019   S/P right THA, AA 05/06/2019   Coronary artery disease involving native coronary artery of native heart without angina pectoris 01/28/2019   Leg edema 01/28/2019   Pain in joint of right hip 11/19/2018   Complete tear of rotator cuff 04/23/2013   Chest pain 10/13/2012   DM2 (diabetes mellitus, type 2) (HCC) 10/13/2012   Essential hypertension 10/13/2012   Hyperlipidemia 10/13/2012   Gout 10/13/2012   Bell's palsy 10/13/2012   Left-sided weakness 10/13/2012   PCP:  Joycelyn Rua, MD Pharmacy:   [facility] - [place], [place] - [address] [place] [address] Phone: [phone] Fax: [phone]  [facility]) - [place], [place] - [address]  [facility] [address] [place] [address] [place] [address] Phone: [phone] Fax: [phone]  [facility] - [place], [place] - [address] [place] [address] Phone: [phone] Fax: [phone]  [facility] [address] [place] [address] Phone: [phone] Fax: [phone]     Social Determinants of Health (SDOH) Social History: SDOH Screenings   Food Insecurity: No Food Insecurity (01/30/2022)  Housing: Low Risk  (01/30/2022)  Transportation Needs: No Transportation Needs (01/30/2022)  Tobacco Use: Medium Risk (12/18/2022)   SDOH Interventions:     Readmission Risk Interventions    12/20/2022    1:38 PM  Readmission Risk Prevention Plan  Transportation Screening Complete  PCP or Specialist Appt within 5-7 Days Complete  Home Care Screening Complete  Medication Review (RN CM) Complete

## 2022-12-20 NOTE — Progress Notes (Signed)
   12/20/22 1253  Vitals  Temp 98 F (36.7 C)  Pulse Rate 84  Resp (!) 21  BP (!) 143/79  SpO2 100 %  O2 Device Room Air  Type of Weight Post-Dialysis  Oxygen Therapy  Patient Activity (if Appropriate) In bed  Pulse Oximetry Type Continuous  Oximetry Probe Site Changed No  Post Treatment  Dialyzer Clearance Lightly streaked  Duration of HD Treatment -hour(s) 3.5 hour(s)  Hemodialysis Intake (mL) 0 mL  Liters Processed 83.9  Fluid Removed (mL) 3000 mL  Tolerated HD Treatment Yes  AVG/AVF Arterial Site Held (minutes) 10 minutes  AVG/AVF Venous Site Held (minutes) 10 minutes   Received patient in bed to unit.  Alert and oriented.  Informed consent signed and in chart.   TX duration:3.5  Patient tolerated well.  Transported back to the room  Alert, without acute distress.  Hand-off given to patient's nurse.   Access used: LUAF Access issues: no complications  Total UF removed: 3000 Medication(s) given: none   [facility]

## 2022-12-20 NOTE — Procedures (Signed)
Patient seen and examined on Hemodialysis. The procedure was supervised and I have made appropriate changes. BP (!) 144/67 (BP Location: Right Arm)   Pulse 75   Temp (!) 97.5 F (36.4 C) (Oral)   Resp 20   Ht 5\' 3"  (1.6 m)   Wt 73.9 kg   SpO2 94%   BMI 28.86 kg/m   QB 400 mL/ min via L AVF, UF goal 3L  Tolerating treatment without complaints at this time.   Bufford Buttner MD [facility] [phone] 11:03 AM

## 2022-12-20 NOTE — Progress Notes (Signed)
RN rounded on patient during shift change. Patient sitting at edge of bed, complains of 0/10 pain, eating breakfast. Offgoing RN made RN aware that report has been going to HD and transport has been called to transfer the patient to HD. RN made patient aware of HD session this AM. Patient verbalize understanding.

## 2022-12-20 NOTE — Discharge Summary (Addendum)
Physician Discharge Summary  Robert Lucas UJW:[id number] DOB: [birth year] DOA: 12/17/2022  PCP: Joycelyn Rua, MD  Admit date: 12/17/2022 Discharge date: 12/20/2022  Admitted From: Home Disposition:  Home  Discharge Condition:Stable CODE STATUS:FULL Diet recommendation: renal  Brief/Interim Summary: Patient is a 82 year old male with history of ESRD on dialysis on MWF schedule,, systolic/diastolic CHF, type 2 diabetes, hyperlipidemia, anemia of chronic disease who presented here with complaint of shortness of breath, cough, subjective fever/chills, myalgia from home. On presentation, he was hemodynamically stable but hypoxic on room air and was put on oxygen.  Lab work showed sodium of 126, potassium of 5.4.  COVID/flu/influenza negative.  Chest x-ray showed right lower lobe infiltrate consistent with pneumonia.  Patient was started on antibiotics for community-acquired pneumonia.  Nephrology consulted for dialysis.  Respiratory status has significantly improved.  He remains on room air.  Afebrile.  He had multiple sessions of dialysis after admission.  Medically stable for discharge home today.  Following problems were addressed during the hospitalization:  Sepsis secondary to community-acquired pneumonia: Presented with fever, chills, shortness of breath, cough.  Hypothermic and presentation.  Chest x-ray showed right lower lobe infiltrate.  Started on antibiotics for community-acquired pneumonia.  Blood cultures have not shown any growth.  He is afebrile.  Sepsis physiology has improved.  He is afebrile, no leukocytosis.  Antibiotics changed to oral.   Acute on chronic Combined systolic/diastolic CHF: Echo as per February 2023 shows EF of 30 to 35%, grade 2 diastolic dysfunction, normal right ventricular function.  He was in CHF exacerbation most likely secondary to volume overload.  Had bilateral crackles, significant lower extremity edema.  Underwent dialysis sessions.  Currently on room  air   Acute hypoxic respiratory failure: Secondary to pneumonia and also from possible volume overload.  Currently on room air.  No cough or shortness of breath.   Hyponatremia/hyperkalemia: Likely from CHF/volume overload.  Continue dialysis as outpatient.   ESRD on dialysis: Dialyzed on Monday, Wednesday, Friday, nephrology following for dialysis.  Dialyzed this morning.  Has AV fistula on the left upper extremity   Type 2 diabetes: Recent A1c of 8%.  Continue home insulin regimen.   Hyperlipidemia: Continue Lipitor   Anemia of chronic disease: Currently hemoglobin stable  Discharge Diagnoses:  Principal Problem:   Acute hypoxic respiratory failure (HCC) Active Problems:   DM2 (diabetes mellitus, type 2) (HCC)   Hyperlipidemia   CAP (community acquired pneumonia)   Acute on chronic combined systolic and diastolic CHF (congestive heart failure) (HCC)   History of anemia due to chronic kidney disease   End-stage renal disease on hemodialysis (HCC)   Severe sepsis (HCC)   Acute hyponatremia   Hyperkalemia    Discharge Instructions  Discharge Instructions     Diet - low sodium heart healthy   Complete by: As directed    Discharge instructions   Complete by: As directed    1)Please take prescribed medications as instructed 2)Follow up with your PCP in a week   Increase activity slowly   Complete by: As directed       Allergies as of 12/20/2022       Reactions   Zestril [lisinopril] Cough   Hydrocodone Nausea Only   Januvia [sitagliptin] Other (See Comments)   Unknown   Levitra [vardenafil] Other (See Comments)   flushing   Prednisone Other (See Comments)   Elevates BGL; patient prefers to not take this   Victoza [liraglutide] Other (See Comments)   GI upset  Penicillins Rash, Other (See Comments)   Rash around ankles Has patient had a PCN reaction causing immediate rash, facial/tongue/throat swelling, SOB or lightheadedness with hypotension: Yes Has patient  had a PCN reaction causing severe rash involving mucus membranes or skin necrosis: Unk Has patient had a PCN reaction that required hospitalization: No Has patient had a PCN reaction occurring within the last 10 years: No If all of the above answers are "NO", then may proceed with Cephalosporin use.        Medication List     TAKE these medications    acetaminophen 650 MG CR tablet Commonly known as: TYLENOL Take 1,300 mg by mouth in the morning, at noon, and at bedtime.   allopurinol 100 MG tablet Commonly known as: ZYLOPRIM Take 100 mg by mouth in the morning.   aspirin EC 81 MG tablet Take 81 mg by mouth daily with supper.   atorvastatin 40 MG tablet Commonly known as: LIPITOR Take 40 mg by mouth at bedtime.   azithromycin 500 MG tablet Commonly known as: Zithromax Take 1 tablet (500 mg total) by mouth daily for 2 days.   CALCITRIOL PO Take 2 capsules by mouth every Monday, Wednesday, and Friday with hemodialysis.   cefdinir 300 MG capsule Commonly known as: OMNICEF Take 1 capsule (300 mg total) by mouth 2 (two) times daily for 4 days.   finasteride 5 MG tablet Commonly known as: PROSCAR Take 5 mg by mouth daily with supper.   gabapentin 300 MG capsule Commonly known as: NEURONTIN Take 1 capsule (300 mg total) by mouth at bedtime.   IRON SUCROSE IV Inject into the vein as needed. At dialysis   metoprolol succinate 50 MG 24 hr tablet Commonly known as: TOPROL-XL Take 1 tablet (50 mg total) by mouth daily. Take with or immediately following a meal.   MIRCERA IJ Mircera   NovoLOG Mix 70/30 FlexPen (70-30) 100 UNIT/ML FlexPen Generic drug: insulin aspart protamine - aspart Inject 14-20 Units into the skin daily as needed (high blood sugar). If BS is <150=0 units, If BS is 150-200 units=14 units, If BS is >201=20 units   tamsulosin 0.4 MG Caps capsule Commonly known as: FLOMAX Take 1 capsule (0.4 mg total) by mouth daily after supper. What changed: when  to take this   traMADol 50 MG tablet Commonly known as: ULTRAM Take 1 tablet (50 mg total) by mouth every 6 (six) hours as needed.        Follow-up Information     Joycelyn Rua, MD. Schedule an appointment as soon as possible for a visit in 1 week(s).   Specialty: Family Medicine Contact information: [address] [place] [address] [phone]                Allergies  Allergen Reactions   Zestril [Lisinopril] Cough   Hydrocodone Nausea Only   Januvia [Sitagliptin] Other (See Comments)    Unknown   Levitra [Vardenafil] Other (See Comments)    flushing   Prednisone Other (See Comments)    Elevates BGL; patient prefers to not take this   Victoza [Liraglutide] Other (See Comments)    GI upset   Penicillins Rash and Other (See Comments)    Rash around ankles Has patient had a PCN reaction causing immediate rash, facial/tongue/throat swelling, SOB or lightheadedness with hypotension: Yes Has patient had a PCN reaction causing severe rash involving mucus membranes or skin necrosis: Unk Has patient had a PCN reaction that required hospitalization:  No Has patient had a PCN reaction occurring within the last 10 years: No If all of the above answers are "NO", then may proceed with Cephalosporin use.     Consultations: Nephrology   Procedures/Studies: [facility] Chest Port 1 View  Result Date: 12/17/2022 CLINICAL DATA:  Shortness of breath EXAM: PORTABLE CHEST 1 VIEW COMPARISON:  10/16/2021 FINDINGS: Cardiomegaly. Linear atelectasis or scarring in the left mid lung. Right basilar platelike atelectasis or infiltrate. Possible small right pleural effusion. No overt edema. No acute bony abnormality. IMPRESSION: Cardiomegaly. Right base atelectasis or infiltrate. Left mid lung atelectasis or scarring. Electronically Signed   By: Charlett Nose M.D.   On: 12/17/2022 19:38      Subjective: Patient seen and examined at the bedside at dialysis suite.  He was on room  air.  Feels very comfortable.  No shortness of breath or cough.  Eager to go home.I called his wife and discussed about dc plan  Discharge Exam: Vitals:   12/20/22 1100 12/20/22 1130  BP: 122/66 133/67  Pulse: 87 84  Resp: (!) 24 (!) 27  Temp:    SpO2: 97% 97%   Vitals:   12/20/22 1000 12/20/22 1030 12/20/22 1100 12/20/22 1130  BP: (!) 143/63 (!) 144/67 122/66 133/67  Pulse: 79 75 87 84  Resp: 16 20 (!) 24 (!) 27  Temp:      TempSrc:      SpO2: 95% 94% 97% 97%  Weight:      Height:        General: Pt is alert, awake, not in acute distress Cardiovascular: RRR, S1/S2 +, no rubs, no gallops Respiratory: CTA bilaterally, no wheezing, no rhonchi Abdominal: Soft, NT, ND, bowel sounds + Extremities: Trace bilateral lower extremity edema, no cyanosis    The results of significant diagnostics from this hospitalization (including imaging, microbiology, ancillary and laboratory) are listed below for reference.     Microbiology: Recent Results (from the past 240 hour(s))  Resp panel by RT-PCR (RSV, Flu A&B, Covid) Anterior Nasal Swab     Status: None   Collection Time: 12/17/22  7:20 PM   Specimen: Anterior Nasal Swab  Result Value Ref Range Status   SARS Coronavirus 2 by RT PCR NEGATIVE NEGATIVE Final    Comment: (NOTE) SARS-CoV-2 target nucleic acids are NOT DETECTED.  The SARS-CoV-2 RNA is generally detectable in upper respiratory specimens during the acute phase of infection. The lowest concentration of SARS-CoV-2 viral copies this assay can detect is 138 copies/mL. A negative result does not preclude SARS-Cov-2 infection and should not be used as the sole basis for treatment or other patient management decisions. A negative result may occur with  improper specimen collection/handling, submission of specimen other than nasopharyngeal swab, presence of viral mutation(s) within the areas targeted by this assay, and inadequate number of viral copies(<138 copies/mL). A  negative result must be combined with clinical observations, patient history, and epidemiological information. The expected result is Negative.  Fact Sheet for Patients:  BloggerCourse.com  Fact Sheet for Healthcare Providers:  SeriousBroker.it  This test is no t yet approved or cleared by the [place] FDA and  has been authorized for detection and/or diagnosis of SARS-CoV-2 by FDA under an Emergency Use Authorization (EUA). This EUA will remain  in effect (meaning this test can be used) for the duration of the COVID-19 declaration under Section 564(b)(1) of the Act, 21 U.S.C.section 360bbb-3(b)(1), unless the authorization is terminated  or revoked sooner.  Influenza A by PCR NEGATIVE NEGATIVE Final   Influenza B by PCR NEGATIVE NEGATIVE Final    Comment: (NOTE) The Xpert Xpress SARS-CoV-2/FLU/RSV plus assay is intended as an aid in the diagnosis of influenza from Nasopharyngeal swab specimens and should not be used as a sole basis for treatment. Nasal washings and aspirates are unacceptable for Xpert Xpress SARS-CoV-2/FLU/RSV testing.  Fact Sheet for Patients: BloggerCourse.com  Fact Sheet for Healthcare Providers: SeriousBroker.it  This test is not yet approved or cleared by the [place] FDA and has been authorized for detection and/or diagnosis of SARS-CoV-2 by FDA under an Emergency Use Authorization (EUA). This EUA will remain in effect (meaning this test can be used) for the duration of the COVID-19 declaration under Section 564(b)(1) of the Act, 21 U.S.C. section 360bbb-3(b)(1), unless the authorization is terminated or revoked.     Resp Syncytial Virus by PCR NEGATIVE NEGATIVE Final    Comment: (NOTE) Fact Sheet for Patients: BloggerCourse.com  Fact Sheet for Healthcare  Providers: SeriousBroker.it  This test is not yet approved or cleared by the [place] FDA and has been authorized for detection and/or diagnosis of SARS-CoV-2 by FDA under an Emergency Use Authorization (EUA). This EUA will remain in effect (meaning this test can be used) for the duration of the COVID-19 declaration under Section 564(b)(1) of the Act, 21 U.S.C. section 360bbb-3(b)(1), unless the authorization is terminated or revoked.  Performed at [facility], [address] [place], [place] [address]   Culture, blood (Routine X 2) w Reflex to ID Panel     Status: None (Preliminary result)   Collection Time: 12/18/22  7:02 AM   Specimen: BLOOD RIGHT ARM  Result Value Ref Range Status   Specimen Description BLOOD RIGHT ARM  Final   Special Requests   Final    BOTTLES DRAWN AEROBIC ONLY Blood Culture results may not be optimal due to an inadequate volume of blood received in culture bottles   Culture   Final    NO GROWTH 2 DAYS Performed at [facility], [address], [place], [place] [address]    Report Status PENDING  Incomplete  Culture, blood (Routine X 2) w Reflex to ID Panel     Status: None (Preliminary result)   Collection Time: 12/18/22  7:16 AM   Specimen: BLOOD RIGHT HAND  Result Value Ref Range Status   Specimen Description BLOOD RIGHT HAND  Final   Special Requests   Final    BOTTLES DRAWN AEROBIC ONLY Blood Culture results may not be optimal due to an inadequate volume of blood received in culture bottles   Culture   Final    NO GROWTH 2 DAYS Performed at [facility], [address] [place], [place] [address]    Report Status PENDING  Incomplete     Labs: BNP (last 3 results) No results for input(s): "BNP" in the last 8760 hours. Basic Metabolic Panel: Recent Labs  Lab 12/17/22 1920 12/18/22 0726 12/19/22 0043 12/20/22 0125  NA 126* 127* 132* 128*  K 5.4* 5.5* 3.9 4.2  CL 89* 90* 91*  90*  CO2 24 20* 28 24  GLUCOSE 165* 146* 195* 141*  BUN 55* 61* 30* 26*  CREATININE 4.50* 4.73* 3.10* 2.92*  CALCIUM 9.7 8.9 8.7* 8.5*   Liver Function Tests: Recent Labs  Lab 12/17/22 1920  AST 58*  ALT 50*  ALKPHOS 347*  BILITOT 1.1  PROT 7.4  ALBUMIN 3.6   No results  for input(s): "LIPASE", "AMYLASE" in the last 168 hours. No results for input(s): "AMMONIA" in the last 168 hours. CBC: Recent Labs  Lab 12/17/22 1920 12/18/22 0727 12/19/22 0043  WBC 9.8 11.3* 8.3  NEUTROABS  --  8.9*  --   HGB 11.4* 11.2* 10.5*  HCT 33.5* 33.6* 31.1*  MCV 98.0 98.5 99.4  PLT 182 141* 142*   Cardiac Enzymes: No results for input(s): "CKTOTAL", "CKMB", "CKMBINDEX", "TROPONINI" in the last 168 hours. BNP: Invalid input(s): "POCBNP" CBG: Recent Labs  Lab 12/18/22 2148 12/19/22 0556 12/19/22 1051 12/19/22 1635 12/19/22 2102  GLUCAP 207* 139* 197* 111* 252*   D-Dimer No results for input(s): "DDIMER" in the last 72 hours. Hgb A1c Recent Labs    12/18/22 0717  HGBA1C 8.8*   Lipid Profile No results for input(s): "CHOL", "HDL", "LDLCALC", "TRIG", "CHOLHDL", "LDLDIRECT" in the last 72 hours. Thyroid function studies No results for input(s): "TSH", "T4TOTAL", "T3FREE", "THYROIDAB" in the last 72 hours.  Invalid input(s): "FREET3" Anemia work up No results for input(s): "VITAMINB12", "FOLATE", "FERRITIN", "TIBC", "IRON", "RETICCTPCT" in the last 72 hours. Urinalysis    Component Value Date/Time   COLORURINE STRAW (A) 07/10/2020 0814   APPEARANCEUR CLEAR 07/10/2020 0814   LABSPEC 1.010 07/10/2020 0814   PHURINE 6.0 07/10/2020 0814   GLUCOSEU 50 (A) 07/10/2020 0814   HGBUR SMALL (A) 07/10/2020 0814   BILIRUBINUR NEGATIVE 07/10/2020 0814   KETONESUR NEGATIVE 07/10/2020 0814   PROTEINUR >=300 (A) 07/10/2020 0814   UROBILINOGEN 0.2 04/17/2013 0828   NITRITE NEGATIVE 07/10/2020 0814   LEUKOCYTESUR NEGATIVE 07/10/2020 0814   Sepsis Labs Recent Labs  Lab 12/17/22 1920  12/18/22 0727 12/19/22 0043  WBC 9.8 11.3* 8.3   Microbiology Recent Results (from the past 240 hour(s))  Resp panel by RT-PCR (RSV, Flu A&B, Covid) Anterior Nasal Swab     Status: None   Collection Time: 12/17/22  7:20 PM   Specimen: Anterior Nasal Swab  Result Value Ref Range Status   SARS Coronavirus 2 by RT PCR NEGATIVE NEGATIVE Final    Comment: (NOTE) SARS-CoV-2 target nucleic acids are NOT DETECTED.  The SARS-CoV-2 RNA is generally detectable in upper respiratory specimens during the acute phase of infection. The lowest concentration of SARS-CoV-2 viral copies this assay can detect is 138 copies/mL. A negative result does not preclude SARS-Cov-2 infection and should not be used as the sole basis for treatment or other patient management decisions. A negative result may occur with  improper specimen collection/handling, submission of specimen other than nasopharyngeal swab, presence of viral mutation(s) within the areas targeted by this assay, and inadequate number of viral copies(<138 copies/mL). A negative result must be combined with clinical observations, patient history, and epidemiological information. The expected result is Negative.  Fact Sheet for Patients:  BloggerCourse.com  Fact Sheet for Healthcare Providers:  SeriousBroker.it  This test is no t yet approved or cleared by the [place] FDA and  has been authorized for detection and/or diagnosis of SARS-CoV-2 by FDA under an Emergency Use Authorization (EUA). This EUA will remain  in effect (meaning this test can be used) for the duration of the COVID-19 declaration under Section 564(b)(1) of the Act, 21 U.S.C.section 360bbb-3(b)(1), unless the authorization is terminated  or revoked sooner.       Influenza A by PCR NEGATIVE NEGATIVE Final   Influenza B by PCR NEGATIVE NEGATIVE Final    Comment: (NOTE) The Xpert Xpress SARS-CoV-2/FLU/RSV plus assay  is intended as an aid in the  diagnosis of influenza from Nasopharyngeal swab specimens and should not be used as a sole basis for treatment. Nasal washings and aspirates are unacceptable for Xpert Xpress SARS-CoV-2/FLU/RSV testing.  Fact Sheet for Patients: BloggerCourse.com  Fact Sheet for Healthcare Providers: SeriousBroker.it  This test is not yet approved or cleared by the [place] FDA and has been authorized for detection and/or diagnosis of SARS-CoV-2 by FDA under an Emergency Use Authorization (EUA). This EUA will remain in effect (meaning this test can be used) for the duration of the COVID-19 declaration under Section 564(b)(1) of the Act, 21 U.S.C. section 360bbb-3(b)(1), unless the authorization is terminated or revoked.     Resp Syncytial Virus by PCR NEGATIVE NEGATIVE Final    Comment: (NOTE) Fact Sheet for Patients: BloggerCourse.com  Fact Sheet for Healthcare Providers: SeriousBroker.it  This test is not yet approved or cleared by the [place] FDA and has been authorized for detection and/or diagnosis of SARS-CoV-2 by FDA under an Emergency Use Authorization (EUA). This EUA will remain in effect (meaning this test can be used) for the duration of the COVID-19 declaration under Section 564(b)(1) of the Act, 21 U.S.C. section 360bbb-3(b)(1), unless the authorization is terminated or revoked.  Performed at [facility], [address], [place], [place] [address]   Culture, blood (Routine X 2) w Reflex to ID Panel     Status: None (Preliminary result)   Collection Time: 12/18/22  7:02 AM   Specimen: BLOOD RIGHT ARM  Result Value Ref Range Status   Specimen Description BLOOD RIGHT ARM  Final   Special Requests   Final    BOTTLES DRAWN AEROBIC ONLY Blood Culture results may not be optimal due to an inadequate volume of blood received in  culture bottles   Culture   Final    NO GROWTH 2 DAYS Performed at [facility], [address] [place], [place] [address]    Report Status PENDING  Incomplete  Culture, blood (Routine X 2) w Reflex to ID Panel     Status: None (Preliminary result)   Collection Time: 12/18/22  7:16 AM   Specimen: BLOOD RIGHT HAND  Result Value Ref Range Status   Specimen Description BLOOD RIGHT HAND  Final   Special Requests   Final    BOTTLES DRAWN AEROBIC ONLY Blood Culture results may not be optimal due to an inadequate volume of blood received in culture bottles   Culture   Final    NO GROWTH 2 DAYS Performed at [facility], [address] [place], [place] [address]    Report Status PENDING  Incomplete    Please note: You were cared for by a hospitalist during your hospital stay. Once you are discharged, your primary care physician will handle any further medical issues. Please note that NO REFILLS for any discharge medications will be authorized once you are discharged, as it is imperative that you return to your primary care physician (or establish a relationship with a primary care physician if you do not have one) for your post hospital discharge needs so that they can reassess your need for medications and monitor your lab values.    Time coordinating discharge: 40 minutes  SIGNED:   Burnadette Pop, MD  [facility] 12/20/2022, 11:55 AM Pager [phone]  If 7PM-7AM, please contact night-coverage [email or web address] Password [email or web address]

## 2022-12-20 NOTE — TOC Transition Note (Signed)
Transition of Care [facility]) - CM/SW Discharge Note   Patient Details  Name: Robert Lucas MRN: [id number] Date of Birth: 10-03-1940  Transition of Care [facility] CM/SW Contact:  Leone Haven, RN Phone Number: 12/20/2022, 1:45 PM   Clinical Narrative:    For dc , daughter will transport home and bring some clothes for him.  TOC to fill meds.    Final next level of care: Home/Self Care Barriers to Discharge: No Barriers Identified   Patient Goals and CMS Choice   Choice offered to / list presented to : NA  Discharge Placement                         Discharge Plan and Services Additional resources added to the After Visit Summary for   In-house Referral: NA Discharge Planning Services: CM Consult Post Acute Care Choice: NA          DME Arranged: N/A DME Agency: NA       HH Arranged: NA          Social Determinants of Health (SDOH) Interventions SDOH Screenings   Food Insecurity: No Food Insecurity (01/30/2022)  Housing: Low Risk  (01/30/2022)  Transportation Needs: No Transportation Needs (01/30/2022)  Tobacco Use: Medium Risk (12/18/2022)     Readmission Risk Interventions    12/20/2022    1:38 PM  Readmission Risk Prevention Plan  Transportation Screening Complete  PCP or Specialist Appt within 5-7 Days Complete  Home Care Screening Complete  Medication Review (RN CM) Complete

## 2022-12-21 ENCOUNTER — Telehealth: Payer: Self-pay | Admitting: Nephrology

## 2022-12-21 LAB — CULTURE, BLOOD (ROUTINE X 2)

## 2022-12-21 NOTE — Progress Notes (Signed)
Late Note Entry  Pt was d/c yesterday. Contacted [facility] this morning to advise clinic of pt's d/c date and that pt should resume care tomorrow. Renal PA sent orders to clinic yesterday.   Olivia Canter Renal Navigator [phone]

## 2022-12-21 NOTE — Telephone Encounter (Signed)
Transition of Care - Initial Contact from Inpatient Facility  Date of discharge: 12/20/22  Date of contact: 12/21/22  Method: Phone Spoke to: Patient's wife, Lorelle Formosa   Patient contacted to discuss transition of care from recent inpatient hospitalization. Patient was admitted to [facility] from 6/16-6/19/24 with discharge diagnosis of acute hypoxic respiratory failure secondary to pneumonia/volume overload to community-acquired pneumonia.   The discharge medication list was reviewed. She's not sure he picked up the antibiotics yet -will check with him when he gets home.   Patient will return to his/her outpatient HD unit on: Friday 6/21   No other concerns at this time.

## 2022-12-22 DIAGNOSIS — D689 Coagulation defect, unspecified: Secondary | ICD-10-CM | POA: Diagnosis not present

## 2022-12-22 DIAGNOSIS — Z992 Dependence on renal dialysis: Secondary | ICD-10-CM | POA: Diagnosis not present

## 2022-12-22 DIAGNOSIS — D631 Anemia in chronic kidney disease: Secondary | ICD-10-CM | POA: Diagnosis not present

## 2022-12-22 DIAGNOSIS — N2581 Secondary hyperparathyroidism of renal origin: Secondary | ICD-10-CM | POA: Diagnosis not present

## 2022-12-22 DIAGNOSIS — N186 End stage renal disease: Secondary | ICD-10-CM | POA: Diagnosis not present

## 2022-12-22 LAB — CULTURE, BLOOD (ROUTINE X 2): Culture: NO GROWTH

## 2022-12-23 LAB — CULTURE, BLOOD (ROUTINE X 2)

## 2022-12-25 DIAGNOSIS — Z992 Dependence on renal dialysis: Secondary | ICD-10-CM | POA: Diagnosis not present

## 2022-12-25 DIAGNOSIS — N2581 Secondary hyperparathyroidism of renal origin: Secondary | ICD-10-CM | POA: Diagnosis not present

## 2022-12-25 DIAGNOSIS — D689 Coagulation defect, unspecified: Secondary | ICD-10-CM | POA: Diagnosis not present

## 2022-12-25 DIAGNOSIS — N186 End stage renal disease: Secondary | ICD-10-CM | POA: Diagnosis not present

## 2022-12-25 DIAGNOSIS — D631 Anemia in chronic kidney disease: Secondary | ICD-10-CM | POA: Diagnosis not present

## 2022-12-26 DIAGNOSIS — Z992 Dependence on renal dialysis: Secondary | ICD-10-CM | POA: Diagnosis not present

## 2022-12-26 DIAGNOSIS — E8770 Fluid overload, unspecified: Secondary | ICD-10-CM | POA: Diagnosis not present

## 2022-12-26 DIAGNOSIS — N2581 Secondary hyperparathyroidism of renal origin: Secondary | ICD-10-CM | POA: Diagnosis not present

## 2022-12-26 DIAGNOSIS — N186 End stage renal disease: Secondary | ICD-10-CM | POA: Diagnosis not present

## 2022-12-27 DIAGNOSIS — Z992 Dependence on renal dialysis: Secondary | ICD-10-CM | POA: Diagnosis not present

## 2022-12-27 DIAGNOSIS — N186 End stage renal disease: Secondary | ICD-10-CM | POA: Diagnosis not present

## 2022-12-27 DIAGNOSIS — D689 Coagulation defect, unspecified: Secondary | ICD-10-CM | POA: Diagnosis not present

## 2022-12-27 DIAGNOSIS — D631 Anemia in chronic kidney disease: Secondary | ICD-10-CM | POA: Diagnosis not present

## 2022-12-27 DIAGNOSIS — N2581 Secondary hyperparathyroidism of renal origin: Secondary | ICD-10-CM | POA: Diagnosis not present

## 2022-12-29 DIAGNOSIS — N2581 Secondary hyperparathyroidism of renal origin: Secondary | ICD-10-CM | POA: Diagnosis not present

## 2022-12-29 DIAGNOSIS — Z992 Dependence on renal dialysis: Secondary | ICD-10-CM | POA: Diagnosis not present

## 2022-12-29 DIAGNOSIS — N186 End stage renal disease: Secondary | ICD-10-CM | POA: Diagnosis not present

## 2022-12-29 DIAGNOSIS — D631 Anemia in chronic kidney disease: Secondary | ICD-10-CM | POA: Diagnosis not present

## 2022-12-29 DIAGNOSIS — D689 Coagulation defect, unspecified: Secondary | ICD-10-CM | POA: Diagnosis not present

## 2022-12-31 DIAGNOSIS — N186 End stage renal disease: Secondary | ICD-10-CM | POA: Diagnosis not present

## 2022-12-31 DIAGNOSIS — E1129 Type 2 diabetes mellitus with other diabetic kidney complication: Secondary | ICD-10-CM | POA: Diagnosis not present

## 2022-12-31 DIAGNOSIS — Z992 Dependence on renal dialysis: Secondary | ICD-10-CM | POA: Diagnosis not present

## 2023-01-01 DIAGNOSIS — N2581 Secondary hyperparathyroidism of renal origin: Secondary | ICD-10-CM | POA: Diagnosis not present

## 2023-01-01 DIAGNOSIS — D509 Iron deficiency anemia, unspecified: Secondary | ICD-10-CM | POA: Diagnosis not present

## 2023-01-01 DIAGNOSIS — R52 Pain, unspecified: Secondary | ICD-10-CM | POA: Diagnosis not present

## 2023-01-01 DIAGNOSIS — E1122 Type 2 diabetes mellitus with diabetic chronic kidney disease: Secondary | ICD-10-CM | POA: Diagnosis not present

## 2023-01-01 DIAGNOSIS — D689 Coagulation defect, unspecified: Secondary | ICD-10-CM | POA: Diagnosis not present

## 2023-01-01 DIAGNOSIS — Z992 Dependence on renal dialysis: Secondary | ICD-10-CM | POA: Diagnosis not present

## 2023-01-01 DIAGNOSIS — N186 End stage renal disease: Secondary | ICD-10-CM | POA: Diagnosis not present

## 2023-01-01 DIAGNOSIS — D631 Anemia in chronic kidney disease: Secondary | ICD-10-CM | POA: Diagnosis not present

## 2023-01-02 DIAGNOSIS — I10 Essential (primary) hypertension: Secondary | ICD-10-CM | POA: Diagnosis not present

## 2023-01-02 DIAGNOSIS — N184 Chronic kidney disease, stage 4 (severe): Secondary | ICD-10-CM | POA: Diagnosis not present

## 2023-01-02 DIAGNOSIS — I25110 Atherosclerotic heart disease of native coronary artery with unstable angina pectoris: Secondary | ICD-10-CM | POA: Diagnosis not present

## 2023-01-02 DIAGNOSIS — E1122 Type 2 diabetes mellitus with diabetic chronic kidney disease: Secondary | ICD-10-CM | POA: Diagnosis not present

## 2023-01-03 DIAGNOSIS — D689 Coagulation defect, unspecified: Secondary | ICD-10-CM | POA: Diagnosis not present

## 2023-01-03 DIAGNOSIS — N186 End stage renal disease: Secondary | ICD-10-CM | POA: Diagnosis not present

## 2023-01-03 DIAGNOSIS — Z992 Dependence on renal dialysis: Secondary | ICD-10-CM | POA: Diagnosis not present

## 2023-01-03 DIAGNOSIS — E1122 Type 2 diabetes mellitus with diabetic chronic kidney disease: Secondary | ICD-10-CM | POA: Diagnosis not present

## 2023-01-03 DIAGNOSIS — R52 Pain, unspecified: Secondary | ICD-10-CM | POA: Diagnosis not present

## 2023-01-03 DIAGNOSIS — D631 Anemia in chronic kidney disease: Secondary | ICD-10-CM | POA: Diagnosis not present

## 2023-01-03 DIAGNOSIS — D509 Iron deficiency anemia, unspecified: Secondary | ICD-10-CM | POA: Diagnosis not present

## 2023-01-03 DIAGNOSIS — N2581 Secondary hyperparathyroidism of renal origin: Secondary | ICD-10-CM | POA: Diagnosis not present

## 2023-01-05 DIAGNOSIS — R52 Pain, unspecified: Secondary | ICD-10-CM | POA: Diagnosis not present

## 2023-01-05 DIAGNOSIS — D631 Anemia in chronic kidney disease: Secondary | ICD-10-CM | POA: Diagnosis not present

## 2023-01-05 DIAGNOSIS — N186 End stage renal disease: Secondary | ICD-10-CM | POA: Diagnosis not present

## 2023-01-05 DIAGNOSIS — D689 Coagulation defect, unspecified: Secondary | ICD-10-CM | POA: Diagnosis not present

## 2023-01-05 DIAGNOSIS — D509 Iron deficiency anemia, unspecified: Secondary | ICD-10-CM | POA: Diagnosis not present

## 2023-01-05 DIAGNOSIS — Z992 Dependence on renal dialysis: Secondary | ICD-10-CM | POA: Diagnosis not present

## 2023-01-05 DIAGNOSIS — N2581 Secondary hyperparathyroidism of renal origin: Secondary | ICD-10-CM | POA: Diagnosis not present

## 2023-01-05 DIAGNOSIS — E1122 Type 2 diabetes mellitus with diabetic chronic kidney disease: Secondary | ICD-10-CM | POA: Diagnosis not present

## 2023-01-08 DIAGNOSIS — R52 Pain, unspecified: Secondary | ICD-10-CM | POA: Diagnosis not present

## 2023-01-08 DIAGNOSIS — N2581 Secondary hyperparathyroidism of renal origin: Secondary | ICD-10-CM | POA: Diagnosis not present

## 2023-01-08 DIAGNOSIS — E1122 Type 2 diabetes mellitus with diabetic chronic kidney disease: Secondary | ICD-10-CM | POA: Diagnosis not present

## 2023-01-08 DIAGNOSIS — Z992 Dependence on renal dialysis: Secondary | ICD-10-CM | POA: Diagnosis not present

## 2023-01-08 DIAGNOSIS — D689 Coagulation defect, unspecified: Secondary | ICD-10-CM | POA: Diagnosis not present

## 2023-01-08 DIAGNOSIS — D631 Anemia in chronic kidney disease: Secondary | ICD-10-CM | POA: Diagnosis not present

## 2023-01-08 DIAGNOSIS — D509 Iron deficiency anemia, unspecified: Secondary | ICD-10-CM | POA: Diagnosis not present

## 2023-01-08 DIAGNOSIS — N186 End stage renal disease: Secondary | ICD-10-CM | POA: Diagnosis not present

## 2023-01-09 DIAGNOSIS — N186 End stage renal disease: Secondary | ICD-10-CM | POA: Diagnosis not present

## 2023-01-09 DIAGNOSIS — E113291 Type 2 diabetes mellitus with mild nonproliferative diabetic retinopathy without macular edema, right eye: Secondary | ICD-10-CM | POA: Diagnosis not present

## 2023-01-09 DIAGNOSIS — N2581 Secondary hyperparathyroidism of renal origin: Secondary | ICD-10-CM | POA: Diagnosis not present

## 2023-01-09 DIAGNOSIS — I132 Hypertensive heart and chronic kidney disease with heart failure and with stage 5 chronic kidney disease, or end stage renal disease: Secondary | ICD-10-CM | POA: Diagnosis not present

## 2023-01-09 DIAGNOSIS — Z23 Encounter for immunization: Secondary | ICD-10-CM | POA: Diagnosis not present

## 2023-01-09 DIAGNOSIS — I272 Pulmonary hypertension, unspecified: Secondary | ICD-10-CM | POA: Diagnosis not present

## 2023-01-09 DIAGNOSIS — I700 Atherosclerosis of aorta: Secondary | ICD-10-CM | POA: Diagnosis not present

## 2023-01-09 DIAGNOSIS — Z992 Dependence on renal dialysis: Secondary | ICD-10-CM | POA: Diagnosis not present

## 2023-01-09 DIAGNOSIS — Z0000 Encounter for general adult medical examination without abnormal findings: Secondary | ICD-10-CM | POA: Diagnosis not present

## 2023-01-09 DIAGNOSIS — I5032 Chronic diastolic (congestive) heart failure: Secondary | ICD-10-CM | POA: Diagnosis not present

## 2023-01-09 DIAGNOSIS — E1122 Type 2 diabetes mellitus with diabetic chronic kidney disease: Secondary | ICD-10-CM | POA: Diagnosis not present

## 2023-01-09 DIAGNOSIS — Z794 Long term (current) use of insulin: Secondary | ICD-10-CM | POA: Diagnosis not present

## 2023-01-10 DIAGNOSIS — D509 Iron deficiency anemia, unspecified: Secondary | ICD-10-CM | POA: Diagnosis not present

## 2023-01-10 DIAGNOSIS — D631 Anemia in chronic kidney disease: Secondary | ICD-10-CM | POA: Diagnosis not present

## 2023-01-10 DIAGNOSIS — D689 Coagulation defect, unspecified: Secondary | ICD-10-CM | POA: Diagnosis not present

## 2023-01-10 DIAGNOSIS — R52 Pain, unspecified: Secondary | ICD-10-CM | POA: Diagnosis not present

## 2023-01-10 DIAGNOSIS — Z992 Dependence on renal dialysis: Secondary | ICD-10-CM | POA: Diagnosis not present

## 2023-01-10 DIAGNOSIS — N2581 Secondary hyperparathyroidism of renal origin: Secondary | ICD-10-CM | POA: Diagnosis not present

## 2023-01-10 DIAGNOSIS — N186 End stage renal disease: Secondary | ICD-10-CM | POA: Diagnosis not present

## 2023-01-10 DIAGNOSIS — E1122 Type 2 diabetes mellitus with diabetic chronic kidney disease: Secondary | ICD-10-CM | POA: Diagnosis not present

## 2023-01-12 DIAGNOSIS — N2581 Secondary hyperparathyroidism of renal origin: Secondary | ICD-10-CM | POA: Diagnosis not present

## 2023-01-12 DIAGNOSIS — E1122 Type 2 diabetes mellitus with diabetic chronic kidney disease: Secondary | ICD-10-CM | POA: Diagnosis not present

## 2023-01-12 DIAGNOSIS — D631 Anemia in chronic kidney disease: Secondary | ICD-10-CM | POA: Diagnosis not present

## 2023-01-12 DIAGNOSIS — Z992 Dependence on renal dialysis: Secondary | ICD-10-CM | POA: Diagnosis not present

## 2023-01-12 DIAGNOSIS — D689 Coagulation defect, unspecified: Secondary | ICD-10-CM | POA: Diagnosis not present

## 2023-01-12 DIAGNOSIS — N186 End stage renal disease: Secondary | ICD-10-CM | POA: Diagnosis not present

## 2023-01-12 DIAGNOSIS — D509 Iron deficiency anemia, unspecified: Secondary | ICD-10-CM | POA: Diagnosis not present

## 2023-01-12 DIAGNOSIS — R52 Pain, unspecified: Secondary | ICD-10-CM | POA: Diagnosis not present

## 2023-01-15 DIAGNOSIS — N2581 Secondary hyperparathyroidism of renal origin: Secondary | ICD-10-CM | POA: Diagnosis not present

## 2023-01-15 DIAGNOSIS — Z992 Dependence on renal dialysis: Secondary | ICD-10-CM | POA: Diagnosis not present

## 2023-01-15 DIAGNOSIS — D509 Iron deficiency anemia, unspecified: Secondary | ICD-10-CM | POA: Diagnosis not present

## 2023-01-15 DIAGNOSIS — R52 Pain, unspecified: Secondary | ICD-10-CM | POA: Diagnosis not present

## 2023-01-15 DIAGNOSIS — N186 End stage renal disease: Secondary | ICD-10-CM | POA: Diagnosis not present

## 2023-01-15 DIAGNOSIS — D631 Anemia in chronic kidney disease: Secondary | ICD-10-CM | POA: Diagnosis not present

## 2023-01-15 DIAGNOSIS — D689 Coagulation defect, unspecified: Secondary | ICD-10-CM | POA: Diagnosis not present

## 2023-01-15 DIAGNOSIS — E1122 Type 2 diabetes mellitus with diabetic chronic kidney disease: Secondary | ICD-10-CM | POA: Diagnosis not present

## 2023-01-17 DIAGNOSIS — N2581 Secondary hyperparathyroidism of renal origin: Secondary | ICD-10-CM | POA: Diagnosis not present

## 2023-01-17 DIAGNOSIS — Z992 Dependence on renal dialysis: Secondary | ICD-10-CM | POA: Diagnosis not present

## 2023-01-17 DIAGNOSIS — N186 End stage renal disease: Secondary | ICD-10-CM | POA: Diagnosis not present

## 2023-01-17 DIAGNOSIS — D689 Coagulation defect, unspecified: Secondary | ICD-10-CM | POA: Diagnosis not present

## 2023-01-17 DIAGNOSIS — E1122 Type 2 diabetes mellitus with diabetic chronic kidney disease: Secondary | ICD-10-CM | POA: Diagnosis not present

## 2023-01-17 DIAGNOSIS — R52 Pain, unspecified: Secondary | ICD-10-CM | POA: Diagnosis not present

## 2023-01-17 DIAGNOSIS — D509 Iron deficiency anemia, unspecified: Secondary | ICD-10-CM | POA: Diagnosis not present

## 2023-01-17 DIAGNOSIS — D631 Anemia in chronic kidney disease: Secondary | ICD-10-CM | POA: Diagnosis not present

## 2023-01-19 DIAGNOSIS — E1122 Type 2 diabetes mellitus with diabetic chronic kidney disease: Secondary | ICD-10-CM | POA: Diagnosis not present

## 2023-01-19 DIAGNOSIS — N186 End stage renal disease: Secondary | ICD-10-CM | POA: Diagnosis not present

## 2023-01-19 DIAGNOSIS — D689 Coagulation defect, unspecified: Secondary | ICD-10-CM | POA: Diagnosis not present

## 2023-01-19 DIAGNOSIS — D631 Anemia in chronic kidney disease: Secondary | ICD-10-CM | POA: Diagnosis not present

## 2023-01-19 DIAGNOSIS — D509 Iron deficiency anemia, unspecified: Secondary | ICD-10-CM | POA: Diagnosis not present

## 2023-01-19 DIAGNOSIS — Z992 Dependence on renal dialysis: Secondary | ICD-10-CM | POA: Diagnosis not present

## 2023-01-19 DIAGNOSIS — R52 Pain, unspecified: Secondary | ICD-10-CM | POA: Diagnosis not present

## 2023-01-19 DIAGNOSIS — N2581 Secondary hyperparathyroidism of renal origin: Secondary | ICD-10-CM | POA: Diagnosis not present

## 2023-01-22 DIAGNOSIS — R52 Pain, unspecified: Secondary | ICD-10-CM | POA: Diagnosis not present

## 2023-01-22 DIAGNOSIS — D631 Anemia in chronic kidney disease: Secondary | ICD-10-CM | POA: Diagnosis not present

## 2023-01-22 DIAGNOSIS — N2581 Secondary hyperparathyroidism of renal origin: Secondary | ICD-10-CM | POA: Diagnosis not present

## 2023-01-22 DIAGNOSIS — N186 End stage renal disease: Secondary | ICD-10-CM | POA: Diagnosis not present

## 2023-01-22 DIAGNOSIS — D509 Iron deficiency anemia, unspecified: Secondary | ICD-10-CM | POA: Diagnosis not present

## 2023-01-22 DIAGNOSIS — D689 Coagulation defect, unspecified: Secondary | ICD-10-CM | POA: Diagnosis not present

## 2023-01-22 DIAGNOSIS — E1122 Type 2 diabetes mellitus with diabetic chronic kidney disease: Secondary | ICD-10-CM | POA: Diagnosis not present

## 2023-01-22 DIAGNOSIS — Z992 Dependence on renal dialysis: Secondary | ICD-10-CM | POA: Diagnosis not present

## 2023-01-23 DIAGNOSIS — E1122 Type 2 diabetes mellitus with diabetic chronic kidney disease: Secondary | ICD-10-CM | POA: Diagnosis not present

## 2023-01-24 DIAGNOSIS — E1122 Type 2 diabetes mellitus with diabetic chronic kidney disease: Secondary | ICD-10-CM | POA: Diagnosis not present

## 2023-01-24 DIAGNOSIS — N186 End stage renal disease: Secondary | ICD-10-CM | POA: Diagnosis not present

## 2023-01-24 DIAGNOSIS — N2581 Secondary hyperparathyroidism of renal origin: Secondary | ICD-10-CM | POA: Diagnosis not present

## 2023-01-24 DIAGNOSIS — R52 Pain, unspecified: Secondary | ICD-10-CM | POA: Diagnosis not present

## 2023-01-24 DIAGNOSIS — Z992 Dependence on renal dialysis: Secondary | ICD-10-CM | POA: Diagnosis not present

## 2023-01-24 DIAGNOSIS — D509 Iron deficiency anemia, unspecified: Secondary | ICD-10-CM | POA: Diagnosis not present

## 2023-01-24 DIAGNOSIS — D631 Anemia in chronic kidney disease: Secondary | ICD-10-CM | POA: Diagnosis not present

## 2023-01-24 DIAGNOSIS — D689 Coagulation defect, unspecified: Secondary | ICD-10-CM | POA: Diagnosis not present

## 2023-01-26 DIAGNOSIS — R52 Pain, unspecified: Secondary | ICD-10-CM | POA: Diagnosis not present

## 2023-01-26 DIAGNOSIS — N2581 Secondary hyperparathyroidism of renal origin: Secondary | ICD-10-CM | POA: Diagnosis not present

## 2023-01-26 DIAGNOSIS — E1122 Type 2 diabetes mellitus with diabetic chronic kidney disease: Secondary | ICD-10-CM | POA: Diagnosis not present

## 2023-01-26 DIAGNOSIS — N186 End stage renal disease: Secondary | ICD-10-CM | POA: Diagnosis not present

## 2023-01-26 DIAGNOSIS — D509 Iron deficiency anemia, unspecified: Secondary | ICD-10-CM | POA: Diagnosis not present

## 2023-01-26 DIAGNOSIS — D631 Anemia in chronic kidney disease: Secondary | ICD-10-CM | POA: Diagnosis not present

## 2023-01-26 DIAGNOSIS — Z992 Dependence on renal dialysis: Secondary | ICD-10-CM | POA: Diagnosis not present

## 2023-01-26 DIAGNOSIS — D689 Coagulation defect, unspecified: Secondary | ICD-10-CM | POA: Diagnosis not present

## 2023-01-29 DIAGNOSIS — E1122 Type 2 diabetes mellitus with diabetic chronic kidney disease: Secondary | ICD-10-CM | POA: Diagnosis not present

## 2023-01-29 DIAGNOSIS — D689 Coagulation defect, unspecified: Secondary | ICD-10-CM | POA: Diagnosis not present

## 2023-01-29 DIAGNOSIS — N2581 Secondary hyperparathyroidism of renal origin: Secondary | ICD-10-CM | POA: Diagnosis not present

## 2023-01-29 DIAGNOSIS — N186 End stage renal disease: Secondary | ICD-10-CM | POA: Diagnosis not present

## 2023-01-29 DIAGNOSIS — R52 Pain, unspecified: Secondary | ICD-10-CM | POA: Diagnosis not present

## 2023-01-29 DIAGNOSIS — D631 Anemia in chronic kidney disease: Secondary | ICD-10-CM | POA: Diagnosis not present

## 2023-01-29 DIAGNOSIS — Z992 Dependence on renal dialysis: Secondary | ICD-10-CM | POA: Diagnosis not present

## 2023-01-29 DIAGNOSIS — D509 Iron deficiency anemia, unspecified: Secondary | ICD-10-CM | POA: Diagnosis not present

## 2023-01-30 DIAGNOSIS — R051 Acute cough: Secondary | ICD-10-CM | POA: Diagnosis not present

## 2023-01-31 DIAGNOSIS — D509 Iron deficiency anemia, unspecified: Secondary | ICD-10-CM | POA: Diagnosis not present

## 2023-01-31 DIAGNOSIS — R52 Pain, unspecified: Secondary | ICD-10-CM | POA: Diagnosis not present

## 2023-01-31 DIAGNOSIS — Z992 Dependence on renal dialysis: Secondary | ICD-10-CM | POA: Diagnosis not present

## 2023-01-31 DIAGNOSIS — E1129 Type 2 diabetes mellitus with other diabetic kidney complication: Secondary | ICD-10-CM | POA: Diagnosis not present

## 2023-01-31 DIAGNOSIS — E1122 Type 2 diabetes mellitus with diabetic chronic kidney disease: Secondary | ICD-10-CM | POA: Diagnosis not present

## 2023-01-31 DIAGNOSIS — N186 End stage renal disease: Secondary | ICD-10-CM | POA: Diagnosis not present

## 2023-01-31 DIAGNOSIS — D631 Anemia in chronic kidney disease: Secondary | ICD-10-CM | POA: Diagnosis not present

## 2023-01-31 DIAGNOSIS — D689 Coagulation defect, unspecified: Secondary | ICD-10-CM | POA: Diagnosis not present

## 2023-01-31 DIAGNOSIS — N2581 Secondary hyperparathyroidism of renal origin: Secondary | ICD-10-CM | POA: Diagnosis not present

## 2023-02-02 DIAGNOSIS — D689 Coagulation defect, unspecified: Secondary | ICD-10-CM | POA: Diagnosis not present

## 2023-02-02 DIAGNOSIS — D631 Anemia in chronic kidney disease: Secondary | ICD-10-CM | POA: Diagnosis not present

## 2023-02-02 DIAGNOSIS — N2581 Secondary hyperparathyroidism of renal origin: Secondary | ICD-10-CM | POA: Diagnosis not present

## 2023-02-02 DIAGNOSIS — N186 End stage renal disease: Secondary | ICD-10-CM | POA: Diagnosis not present

## 2023-02-02 DIAGNOSIS — Z992 Dependence on renal dialysis: Secondary | ICD-10-CM | POA: Diagnosis not present

## 2023-02-05 DIAGNOSIS — Z992 Dependence on renal dialysis: Secondary | ICD-10-CM | POA: Diagnosis not present

## 2023-02-05 DIAGNOSIS — N2581 Secondary hyperparathyroidism of renal origin: Secondary | ICD-10-CM | POA: Diagnosis not present

## 2023-02-05 DIAGNOSIS — N186 End stage renal disease: Secondary | ICD-10-CM | POA: Diagnosis not present

## 2023-02-05 DIAGNOSIS — D631 Anemia in chronic kidney disease: Secondary | ICD-10-CM | POA: Diagnosis not present

## 2023-02-05 DIAGNOSIS — D689 Coagulation defect, unspecified: Secondary | ICD-10-CM | POA: Diagnosis not present

## 2023-02-07 DIAGNOSIS — N186 End stage renal disease: Secondary | ICD-10-CM | POA: Diagnosis not present

## 2023-02-07 DIAGNOSIS — D631 Anemia in chronic kidney disease: Secondary | ICD-10-CM | POA: Diagnosis not present

## 2023-02-07 DIAGNOSIS — D689 Coagulation defect, unspecified: Secondary | ICD-10-CM | POA: Diagnosis not present

## 2023-02-07 DIAGNOSIS — N2581 Secondary hyperparathyroidism of renal origin: Secondary | ICD-10-CM | POA: Diagnosis not present

## 2023-02-07 DIAGNOSIS — Z992 Dependence on renal dialysis: Secondary | ICD-10-CM | POA: Diagnosis not present

## 2023-02-09 DIAGNOSIS — Z992 Dependence on renal dialysis: Secondary | ICD-10-CM | POA: Diagnosis not present

## 2023-02-09 DIAGNOSIS — N2581 Secondary hyperparathyroidism of renal origin: Secondary | ICD-10-CM | POA: Diagnosis not present

## 2023-02-09 DIAGNOSIS — D689 Coagulation defect, unspecified: Secondary | ICD-10-CM | POA: Diagnosis not present

## 2023-02-09 DIAGNOSIS — D631 Anemia in chronic kidney disease: Secondary | ICD-10-CM | POA: Diagnosis not present

## 2023-02-09 DIAGNOSIS — N186 End stage renal disease: Secondary | ICD-10-CM | POA: Diagnosis not present

## 2023-02-12 DIAGNOSIS — D631 Anemia in chronic kidney disease: Secondary | ICD-10-CM | POA: Diagnosis not present

## 2023-02-12 DIAGNOSIS — N2581 Secondary hyperparathyroidism of renal origin: Secondary | ICD-10-CM | POA: Diagnosis not present

## 2023-02-12 DIAGNOSIS — D689 Coagulation defect, unspecified: Secondary | ICD-10-CM | POA: Diagnosis not present

## 2023-02-12 DIAGNOSIS — N186 End stage renal disease: Secondary | ICD-10-CM | POA: Diagnosis not present

## 2023-02-12 DIAGNOSIS — Z992 Dependence on renal dialysis: Secondary | ICD-10-CM | POA: Diagnosis not present

## 2023-02-13 DIAGNOSIS — R195 Other fecal abnormalities: Secondary | ICD-10-CM | POA: Diagnosis not present

## 2023-02-14 DIAGNOSIS — D689 Coagulation defect, unspecified: Secondary | ICD-10-CM | POA: Diagnosis not present

## 2023-02-14 DIAGNOSIS — N186 End stage renal disease: Secondary | ICD-10-CM | POA: Diagnosis not present

## 2023-02-14 DIAGNOSIS — D631 Anemia in chronic kidney disease: Secondary | ICD-10-CM | POA: Diagnosis not present

## 2023-02-14 DIAGNOSIS — N2581 Secondary hyperparathyroidism of renal origin: Secondary | ICD-10-CM | POA: Diagnosis not present

## 2023-02-14 DIAGNOSIS — Z992 Dependence on renal dialysis: Secondary | ICD-10-CM | POA: Diagnosis not present

## 2023-02-14 NOTE — Telephone Encounter (Signed)
No action done

## 2023-02-16 DIAGNOSIS — Z992 Dependence on renal dialysis: Secondary | ICD-10-CM | POA: Diagnosis not present

## 2023-02-16 DIAGNOSIS — D689 Coagulation defect, unspecified: Secondary | ICD-10-CM | POA: Diagnosis not present

## 2023-02-16 DIAGNOSIS — N186 End stage renal disease: Secondary | ICD-10-CM | POA: Diagnosis not present

## 2023-02-16 DIAGNOSIS — N2581 Secondary hyperparathyroidism of renal origin: Secondary | ICD-10-CM | POA: Diagnosis not present

## 2023-02-16 DIAGNOSIS — D631 Anemia in chronic kidney disease: Secondary | ICD-10-CM | POA: Diagnosis not present

## 2023-02-19 DIAGNOSIS — Z992 Dependence on renal dialysis: Secondary | ICD-10-CM | POA: Diagnosis not present

## 2023-02-19 DIAGNOSIS — N2581 Secondary hyperparathyroidism of renal origin: Secondary | ICD-10-CM | POA: Diagnosis not present

## 2023-02-19 DIAGNOSIS — N186 End stage renal disease: Secondary | ICD-10-CM | POA: Diagnosis not present

## 2023-02-19 DIAGNOSIS — D631 Anemia in chronic kidney disease: Secondary | ICD-10-CM | POA: Diagnosis not present

## 2023-02-19 DIAGNOSIS — D689 Coagulation defect, unspecified: Secondary | ICD-10-CM | POA: Diagnosis not present

## 2023-02-21 DIAGNOSIS — N186 End stage renal disease: Secondary | ICD-10-CM | POA: Diagnosis not present

## 2023-02-21 DIAGNOSIS — D689 Coagulation defect, unspecified: Secondary | ICD-10-CM | POA: Diagnosis not present

## 2023-02-21 DIAGNOSIS — N2581 Secondary hyperparathyroidism of renal origin: Secondary | ICD-10-CM | POA: Diagnosis not present

## 2023-02-21 DIAGNOSIS — Z992 Dependence on renal dialysis: Secondary | ICD-10-CM | POA: Diagnosis not present

## 2023-02-21 DIAGNOSIS — D631 Anemia in chronic kidney disease: Secondary | ICD-10-CM | POA: Diagnosis not present

## 2023-02-23 DIAGNOSIS — D631 Anemia in chronic kidney disease: Secondary | ICD-10-CM | POA: Diagnosis not present

## 2023-02-23 DIAGNOSIS — N2581 Secondary hyperparathyroidism of renal origin: Secondary | ICD-10-CM | POA: Diagnosis not present

## 2023-02-23 DIAGNOSIS — Z992 Dependence on renal dialysis: Secondary | ICD-10-CM | POA: Diagnosis not present

## 2023-02-23 DIAGNOSIS — D689 Coagulation defect, unspecified: Secondary | ICD-10-CM | POA: Diagnosis not present

## 2023-02-23 DIAGNOSIS — N186 End stage renal disease: Secondary | ICD-10-CM | POA: Diagnosis not present

## 2023-02-26 DIAGNOSIS — N2581 Secondary hyperparathyroidism of renal origin: Secondary | ICD-10-CM | POA: Diagnosis not present

## 2023-02-26 DIAGNOSIS — Z992 Dependence on renal dialysis: Secondary | ICD-10-CM | POA: Diagnosis not present

## 2023-02-26 DIAGNOSIS — D631 Anemia in chronic kidney disease: Secondary | ICD-10-CM | POA: Diagnosis not present

## 2023-02-26 DIAGNOSIS — N186 End stage renal disease: Secondary | ICD-10-CM | POA: Diagnosis not present

## 2023-02-26 DIAGNOSIS — D689 Coagulation defect, unspecified: Secondary | ICD-10-CM | POA: Diagnosis not present

## 2023-02-27 DIAGNOSIS — G4733 Obstructive sleep apnea (adult) (pediatric): Secondary | ICD-10-CM | POA: Diagnosis not present

## 2023-02-27 DIAGNOSIS — I25110 Atherosclerotic heart disease of native coronary artery with unstable angina pectoris: Secondary | ICD-10-CM | POA: Diagnosis not present

## 2023-02-27 DIAGNOSIS — Z8739 Personal history of other diseases of the musculoskeletal system and connective tissue: Secondary | ICD-10-CM | POA: Diagnosis not present

## 2023-02-27 DIAGNOSIS — Z86010 Personal history of colonic polyps: Secondary | ICD-10-CM | POA: Diagnosis not present

## 2023-02-27 DIAGNOSIS — I10 Essential (primary) hypertension: Secondary | ICD-10-CM | POA: Diagnosis not present

## 2023-02-27 DIAGNOSIS — Z794 Long term (current) use of insulin: Secondary | ICD-10-CM | POA: Diagnosis not present

## 2023-02-27 DIAGNOSIS — E1122 Type 2 diabetes mellitus with diabetic chronic kidney disease: Secondary | ICD-10-CM | POA: Diagnosis not present

## 2023-02-27 DIAGNOSIS — M48061 Spinal stenosis, lumbar region without neurogenic claudication: Secondary | ICD-10-CM | POA: Diagnosis not present

## 2023-02-28 DIAGNOSIS — D689 Coagulation defect, unspecified: Secondary | ICD-10-CM | POA: Diagnosis not present

## 2023-02-28 DIAGNOSIS — D631 Anemia in chronic kidney disease: Secondary | ICD-10-CM | POA: Diagnosis not present

## 2023-02-28 DIAGNOSIS — N186 End stage renal disease: Secondary | ICD-10-CM | POA: Diagnosis not present

## 2023-02-28 DIAGNOSIS — Z992 Dependence on renal dialysis: Secondary | ICD-10-CM | POA: Diagnosis not present

## 2023-02-28 DIAGNOSIS — N2581 Secondary hyperparathyroidism of renal origin: Secondary | ICD-10-CM | POA: Diagnosis not present

## 2023-03-02 DIAGNOSIS — Z992 Dependence on renal dialysis: Secondary | ICD-10-CM | POA: Diagnosis not present

## 2023-03-02 DIAGNOSIS — D689 Coagulation defect, unspecified: Secondary | ICD-10-CM | POA: Diagnosis not present

## 2023-03-02 DIAGNOSIS — N2581 Secondary hyperparathyroidism of renal origin: Secondary | ICD-10-CM | POA: Diagnosis not present

## 2023-03-02 DIAGNOSIS — N186 End stage renal disease: Secondary | ICD-10-CM | POA: Diagnosis not present

## 2023-03-02 DIAGNOSIS — D631 Anemia in chronic kidney disease: Secondary | ICD-10-CM | POA: Diagnosis not present

## 2023-03-03 DIAGNOSIS — N186 End stage renal disease: Secondary | ICD-10-CM | POA: Diagnosis not present

## 2023-03-03 DIAGNOSIS — Z992 Dependence on renal dialysis: Secondary | ICD-10-CM | POA: Diagnosis not present

## 2023-03-03 DIAGNOSIS — E1129 Type 2 diabetes mellitus with other diabetic kidney complication: Secondary | ICD-10-CM | POA: Diagnosis not present

## 2023-03-05 DIAGNOSIS — D689 Coagulation defect, unspecified: Secondary | ICD-10-CM | POA: Diagnosis not present

## 2023-03-05 DIAGNOSIS — N186 End stage renal disease: Secondary | ICD-10-CM | POA: Diagnosis not present

## 2023-03-05 DIAGNOSIS — R519 Headache, unspecified: Secondary | ICD-10-CM | POA: Diagnosis not present

## 2023-03-05 DIAGNOSIS — R52 Pain, unspecified: Secondary | ICD-10-CM | POA: Diagnosis not present

## 2023-03-05 DIAGNOSIS — Z992 Dependence on renal dialysis: Secondary | ICD-10-CM | POA: Diagnosis not present

## 2023-03-05 DIAGNOSIS — N2581 Secondary hyperparathyroidism of renal origin: Secondary | ICD-10-CM | POA: Diagnosis not present

## 2023-03-05 DIAGNOSIS — D631 Anemia in chronic kidney disease: Secondary | ICD-10-CM | POA: Diagnosis not present

## 2023-03-07 DIAGNOSIS — N2581 Secondary hyperparathyroidism of renal origin: Secondary | ICD-10-CM | POA: Diagnosis not present

## 2023-03-07 DIAGNOSIS — N186 End stage renal disease: Secondary | ICD-10-CM | POA: Diagnosis not present

## 2023-03-07 DIAGNOSIS — D689 Coagulation defect, unspecified: Secondary | ICD-10-CM | POA: Diagnosis not present

## 2023-03-07 DIAGNOSIS — R52 Pain, unspecified: Secondary | ICD-10-CM | POA: Diagnosis not present

## 2023-03-07 DIAGNOSIS — Z992 Dependence on renal dialysis: Secondary | ICD-10-CM | POA: Diagnosis not present

## 2023-03-07 DIAGNOSIS — R519 Headache, unspecified: Secondary | ICD-10-CM | POA: Diagnosis not present

## 2023-03-07 DIAGNOSIS — D631 Anemia in chronic kidney disease: Secondary | ICD-10-CM | POA: Diagnosis not present

## 2023-03-08 NOTE — Progress Notes (Signed)
This encounter was created in error - please disregard.

## 2023-03-09 DIAGNOSIS — Z992 Dependence on renal dialysis: Secondary | ICD-10-CM | POA: Diagnosis not present

## 2023-03-09 DIAGNOSIS — D631 Anemia in chronic kidney disease: Secondary | ICD-10-CM | POA: Diagnosis not present

## 2023-03-09 DIAGNOSIS — R519 Headache, unspecified: Secondary | ICD-10-CM | POA: Diagnosis not present

## 2023-03-09 DIAGNOSIS — N2581 Secondary hyperparathyroidism of renal origin: Secondary | ICD-10-CM | POA: Diagnosis not present

## 2023-03-09 DIAGNOSIS — N186 End stage renal disease: Secondary | ICD-10-CM | POA: Diagnosis not present

## 2023-03-09 DIAGNOSIS — D689 Coagulation defect, unspecified: Secondary | ICD-10-CM | POA: Diagnosis not present

## 2023-03-09 DIAGNOSIS — R52 Pain, unspecified: Secondary | ICD-10-CM | POA: Diagnosis not present

## 2023-03-12 DIAGNOSIS — Z992 Dependence on renal dialysis: Secondary | ICD-10-CM | POA: Diagnosis not present

## 2023-03-12 DIAGNOSIS — R519 Headache, unspecified: Secondary | ICD-10-CM | POA: Diagnosis not present

## 2023-03-12 DIAGNOSIS — D689 Coagulation defect, unspecified: Secondary | ICD-10-CM | POA: Diagnosis not present

## 2023-03-12 DIAGNOSIS — D631 Anemia in chronic kidney disease: Secondary | ICD-10-CM | POA: Diagnosis not present

## 2023-03-12 DIAGNOSIS — N2581 Secondary hyperparathyroidism of renal origin: Secondary | ICD-10-CM | POA: Diagnosis not present

## 2023-03-12 DIAGNOSIS — N186 End stage renal disease: Secondary | ICD-10-CM | POA: Diagnosis not present

## 2023-03-12 DIAGNOSIS — R52 Pain, unspecified: Secondary | ICD-10-CM | POA: Diagnosis not present

## 2023-03-13 DIAGNOSIS — M5136 Other intervertebral disc degeneration, lumbar region: Secondary | ICD-10-CM | POA: Diagnosis not present

## 2023-03-13 DIAGNOSIS — M791 Myalgia, unspecified site: Secondary | ICD-10-CM | POA: Diagnosis not present

## 2023-03-14 DIAGNOSIS — D631 Anemia in chronic kidney disease: Secondary | ICD-10-CM | POA: Diagnosis not present

## 2023-03-14 DIAGNOSIS — N2581 Secondary hyperparathyroidism of renal origin: Secondary | ICD-10-CM | POA: Diagnosis not present

## 2023-03-14 DIAGNOSIS — R519 Headache, unspecified: Secondary | ICD-10-CM | POA: Diagnosis not present

## 2023-03-14 DIAGNOSIS — N186 End stage renal disease: Secondary | ICD-10-CM | POA: Diagnosis not present

## 2023-03-14 DIAGNOSIS — R52 Pain, unspecified: Secondary | ICD-10-CM | POA: Diagnosis not present

## 2023-03-14 DIAGNOSIS — D689 Coagulation defect, unspecified: Secondary | ICD-10-CM | POA: Diagnosis not present

## 2023-03-14 DIAGNOSIS — Z992 Dependence on renal dialysis: Secondary | ICD-10-CM | POA: Diagnosis not present

## 2023-03-16 DIAGNOSIS — R52 Pain, unspecified: Secondary | ICD-10-CM | POA: Diagnosis not present

## 2023-03-16 DIAGNOSIS — D689 Coagulation defect, unspecified: Secondary | ICD-10-CM | POA: Diagnosis not present

## 2023-03-16 DIAGNOSIS — N2581 Secondary hyperparathyroidism of renal origin: Secondary | ICD-10-CM | POA: Diagnosis not present

## 2023-03-16 DIAGNOSIS — N186 End stage renal disease: Secondary | ICD-10-CM | POA: Diagnosis not present

## 2023-03-16 DIAGNOSIS — D631 Anemia in chronic kidney disease: Secondary | ICD-10-CM | POA: Diagnosis not present

## 2023-03-16 DIAGNOSIS — R519 Headache, unspecified: Secondary | ICD-10-CM | POA: Diagnosis not present

## 2023-03-16 DIAGNOSIS — Z992 Dependence on renal dialysis: Secondary | ICD-10-CM | POA: Diagnosis not present

## 2023-03-19 DIAGNOSIS — N2581 Secondary hyperparathyroidism of renal origin: Secondary | ICD-10-CM | POA: Diagnosis not present

## 2023-03-19 DIAGNOSIS — N186 End stage renal disease: Secondary | ICD-10-CM | POA: Diagnosis not present

## 2023-03-19 DIAGNOSIS — D631 Anemia in chronic kidney disease: Secondary | ICD-10-CM | POA: Diagnosis not present

## 2023-03-19 DIAGNOSIS — R519 Headache, unspecified: Secondary | ICD-10-CM | POA: Diagnosis not present

## 2023-03-19 DIAGNOSIS — R52 Pain, unspecified: Secondary | ICD-10-CM | POA: Diagnosis not present

## 2023-03-19 DIAGNOSIS — D689 Coagulation defect, unspecified: Secondary | ICD-10-CM | POA: Diagnosis not present

## 2023-03-19 DIAGNOSIS — Z992 Dependence on renal dialysis: Secondary | ICD-10-CM | POA: Diagnosis not present

## 2023-03-20 DIAGNOSIS — M546 Pain in thoracic spine: Secondary | ICD-10-CM | POA: Diagnosis not present

## 2023-03-20 DIAGNOSIS — M7918 Myalgia, other site: Secondary | ICD-10-CM | POA: Diagnosis not present

## 2023-03-21 DIAGNOSIS — Z992 Dependence on renal dialysis: Secondary | ICD-10-CM | POA: Diagnosis not present

## 2023-03-21 DIAGNOSIS — D631 Anemia in chronic kidney disease: Secondary | ICD-10-CM | POA: Diagnosis not present

## 2023-03-21 DIAGNOSIS — D689 Coagulation defect, unspecified: Secondary | ICD-10-CM | POA: Diagnosis not present

## 2023-03-21 DIAGNOSIS — N2581 Secondary hyperparathyroidism of renal origin: Secondary | ICD-10-CM | POA: Diagnosis not present

## 2023-03-21 DIAGNOSIS — N186 End stage renal disease: Secondary | ICD-10-CM | POA: Diagnosis not present

## 2023-03-21 DIAGNOSIS — R519 Headache, unspecified: Secondary | ICD-10-CM | POA: Diagnosis not present

## 2023-03-21 DIAGNOSIS — R52 Pain, unspecified: Secondary | ICD-10-CM | POA: Diagnosis not present

## 2023-03-23 DIAGNOSIS — R52 Pain, unspecified: Secondary | ICD-10-CM | POA: Diagnosis not present

## 2023-03-23 DIAGNOSIS — D631 Anemia in chronic kidney disease: Secondary | ICD-10-CM | POA: Diagnosis not present

## 2023-03-23 DIAGNOSIS — N186 End stage renal disease: Secondary | ICD-10-CM | POA: Diagnosis not present

## 2023-03-23 DIAGNOSIS — R519 Headache, unspecified: Secondary | ICD-10-CM | POA: Diagnosis not present

## 2023-03-23 DIAGNOSIS — Z992 Dependence on renal dialysis: Secondary | ICD-10-CM | POA: Diagnosis not present

## 2023-03-23 DIAGNOSIS — N2581 Secondary hyperparathyroidism of renal origin: Secondary | ICD-10-CM | POA: Diagnosis not present

## 2023-03-23 DIAGNOSIS — D689 Coagulation defect, unspecified: Secondary | ICD-10-CM | POA: Diagnosis not present

## 2023-03-25 ENCOUNTER — Encounter (HOSPITAL_BASED_OUTPATIENT_CLINIC_OR_DEPARTMENT_OTHER): Payer: Self-pay | Admitting: Emergency Medicine

## 2023-03-25 ENCOUNTER — Emergency Department (HOSPITAL_BASED_OUTPATIENT_CLINIC_OR_DEPARTMENT_OTHER): Payer: Medicare Other | Admitting: Radiology

## 2023-03-25 ENCOUNTER — Emergency Department (HOSPITAL_BASED_OUTPATIENT_CLINIC_OR_DEPARTMENT_OTHER)
Admission: EM | Admit: 2023-03-25 | Discharge: 2023-03-25 | Disposition: A | Discharge status: Home / Self Care | Payer: Medicare Other | Attending: Emergency Medicine | Admitting: Emergency Medicine

## 2023-03-25 DIAGNOSIS — I509 Heart failure, unspecified: Secondary | ICD-10-CM | POA: Insufficient documentation

## 2023-03-25 DIAGNOSIS — M546 Pain in thoracic spine: Secondary | ICD-10-CM | POA: Insufficient documentation

## 2023-03-25 DIAGNOSIS — E1122 Type 2 diabetes mellitus with diabetic chronic kidney disease: Secondary | ICD-10-CM | POA: Insufficient documentation

## 2023-03-25 DIAGNOSIS — Z7982 Long term (current) use of aspirin: Secondary | ICD-10-CM | POA: Insufficient documentation

## 2023-03-25 DIAGNOSIS — R0781 Pleurodynia: Secondary | ICD-10-CM | POA: Diagnosis not present

## 2023-03-25 DIAGNOSIS — N186 End stage renal disease: Secondary | ICD-10-CM | POA: Diagnosis not present

## 2023-03-25 DIAGNOSIS — Z794 Long term (current) use of insulin: Secondary | ICD-10-CM | POA: Insufficient documentation

## 2023-03-25 DIAGNOSIS — J90 Pleural effusion, not elsewhere classified: Secondary | ICD-10-CM | POA: Insufficient documentation

## 2023-03-25 DIAGNOSIS — M791 Myalgia, unspecified site: Secondary | ICD-10-CM | POA: Insufficient documentation

## 2023-03-25 DIAGNOSIS — R918 Other nonspecific abnormal finding of lung field: Secondary | ICD-10-CM | POA: Diagnosis not present

## 2023-03-25 DIAGNOSIS — Z992 Dependence on renal dialysis: Secondary | ICD-10-CM | POA: Insufficient documentation

## 2023-03-25 DIAGNOSIS — I517 Cardiomegaly: Secondary | ICD-10-CM | POA: Diagnosis not present

## 2023-03-25 MED ORDER — LIDOCAINE 5 % EX PTCH
1.0000 | MEDICATED_PATCH | CUTANEOUS | Status: DC
  Administered 2023-03-25: 1 via TRANSDERMAL
  Filled 2023-03-25: qty 1

## 2023-03-25 MED ORDER — CYCLOBENZAPRINE HCL 10 MG PO TABS: 10.0000 mg | ORAL_TABLET | Freq: Two times a day (BID) | ORAL | 0 refills | Status: DC | PRN

## 2023-03-25 MED ORDER — TRAMADOL HCL 50 MG PO TABS
50.0000 mg | ORAL_TABLET | Freq: Once | ORAL | Status: AC
  Administered 2023-03-25: 50 mg via ORAL
  Filled 2023-03-25: qty 1

## 2023-03-25 MED ORDER — LIDOCAINE 5 % EX PTCH: 1.0000 | MEDICATED_PATCH | CUTANEOUS | 0 refills | Status: DC

## 2023-03-25 NOTE — ED Triage Notes (Signed)
Pt endorses RT side back pain x ~ 1 month, tx at [facility] for same.

## 2023-03-25 NOTE — ED Provider Notes (Signed)
[place] EMERGENCY DEPARTMENT AT [facility] Provider Note   CSN: [id number] Arrival date & time: 03/25/23  4098     History {Add pertinent medical, surgical, social history, OB history to HPI:1} No chief complaint on file.   Robert Lucas is a 82 y.o. male.  HPI      82yo male with history of ESRD on dialysis MWF, systolic/diastolic CHF, type 2 DM, hyperlipidemia, anemia,    Saw Orthopedics 9/10 --scoliosis severe degenerative disk disease, had injection of kenalog and marcaine.  Then saw 9/17 thoracic spine pain, XR with scoliosis, do not see any pathologic findings on XR  Yesterday took tylenol arthritis Reports that the right upper back pain has been there for a few months If lay down in bed there are certain positions can sit up and provides a little relief After the last injection Dr. Reece Agar said maybe the next thing would be an MRI (per Dr. Reece Agar note the pain just started and did not think MRI indicated at this time)-per pt now pain has been present for months, worse recently The pain is worse with movements, worse with deep breaths  No shortness, no nausea, no vomiting or abdominal pain or chest pain No fever  Cough some Temporarily had relief with injection but returned Has not missed dialysis   Home Medications Prior to Admission medications   Medication Sig Start Date End Date Taking? Authorizing Provider  acetaminophen (TYLENOL) 650 MG CR tablet Take 1,300 mg by mouth in the morning, at noon, and at bedtime.    [provider]  allopurinol (ZYLOPRIM) 100 MG tablet Take 100 mg by mouth in the morning.    [provider]  aspirin EC 81 MG tablet Take 81 mg by mouth daily with supper.    [provider]  atorvastatin (LIPITOR) 40 MG tablet Take 40 mg by mouth at bedtime.    [provider]  CALCITRIOL PO Take 2 capsules by mouth every Monday, Wednesday, and Friday with hemodialysis.    [provider]   finasteride (PROSCAR) 5 MG tablet Take 5 mg by mouth daily with supper.    [provider]  gabapentin (NEURONTIN) 300 MG capsule Take 1 capsule (300 mg total) by mouth at bedtime. 06/09/22   Lars Mage, PA-C  insulin aspart protamine - aspart (NOVOLOG MIX 70/30 FLEXPEN) (70-30) 100 UNIT/ML FlexPen Inject 14-20 Units into the skin daily as needed (high blood sugar). If BS is <150=0 units, If BS is 150-200 units=14 units, If BS is >201=20 units    [provider]  IRON SUCROSE IV Inject into the vein as needed. At dialysis    [provider]  Methoxy PEG-Epoetin Beta (MIRCERA IJ) Mircera 05/15/22 05/14/23  [provider]  metoprolol succinate (TOPROL-XL) 50 MG 24 hr tablet Take 1 tablet (50 mg total) by mouth daily. Take with or immediately following a meal. 06/20/22   Patwardhan, Manish J, MD  tamsulosin (FLOMAX) 0.4 MG CAPS capsule Take 1 capsule (0.4 mg total) by mouth daily after supper. Patient taking differently: Take 0.4 mg by mouth at bedtime. 07/11/20   Albertine Grates, MD  traMADol (ULTRAM) 50 MG tablet Take 1 tablet (50 mg total) by mouth every 6 (six) hours as needed. Patient not taking: Reported on 06/20/2022 05/02/22   Emilie Rutter, PA-C      Allergies    Zestril [lisinopril], Hydrocodone, Januvia [sitagliptin], Levitra [vardenafil], Prednisone, Victoza [liraglutide], and Penicillins    Review of Systems  Review of Systems  Physical Exam Updated Vital Signs There were no vitals taken for this visit. Physical Exam  ED Results / Procedures / Treatments   Labs (all labs ordered are listed, but only abnormal results are displayed) Labs Reviewed - No data to display  EKG None  Radiology No results found.  Procedures Procedures  {Document cardiac monitor, telemetry assessment procedure when appropriate:1}  Medications Ordered in ED Medications - No data to display  ED Course/ Medical Decision Making/ A&P   {   Click here for  ABCD2, HEART and other calculatorsREFRESH Note before signing :1}                              Medical Decision Making  ***  {Document critical care time when appropriate:1} {Document review of labs and clinical decision tools ie heart score, Chads2Vasc2 etc:1}  {Document your independent review of radiology images, and any outside records:1} {Document your discussion with family members, caretakers, and with consultants:1} {Document social determinants of health affecting pt's care:1} {Document your decision making why or why not admission, treatments were needed:1} Final Clinical Impression(s) / ED Diagnoses Final diagnoses:  None    Rx / DC Orders ED Discharge Orders     None

## 2023-03-26 DIAGNOSIS — N186 End stage renal disease: Secondary | ICD-10-CM | POA: Diagnosis not present

## 2023-03-26 DIAGNOSIS — R52 Pain, unspecified: Secondary | ICD-10-CM | POA: Diagnosis not present

## 2023-03-26 DIAGNOSIS — D689 Coagulation defect, unspecified: Secondary | ICD-10-CM | POA: Diagnosis not present

## 2023-03-26 DIAGNOSIS — Z992 Dependence on renal dialysis: Secondary | ICD-10-CM | POA: Diagnosis not present

## 2023-03-26 DIAGNOSIS — D631 Anemia in chronic kidney disease: Secondary | ICD-10-CM | POA: Diagnosis not present

## 2023-03-26 DIAGNOSIS — R519 Headache, unspecified: Secondary | ICD-10-CM | POA: Diagnosis not present

## 2023-03-26 DIAGNOSIS — N2581 Secondary hyperparathyroidism of renal origin: Secondary | ICD-10-CM | POA: Diagnosis not present

## 2023-03-27 DIAGNOSIS — E113393 Type 2 diabetes mellitus with moderate nonproliferative diabetic retinopathy without macular edema, bilateral: Secondary | ICD-10-CM | POA: Diagnosis not present

## 2023-03-27 DIAGNOSIS — M5416 Radiculopathy, lumbar region: Secondary | ICD-10-CM | POA: Diagnosis not present

## 2023-03-27 DIAGNOSIS — M791 Myalgia, unspecified site: Secondary | ICD-10-CM | POA: Diagnosis not present

## 2023-03-27 DIAGNOSIS — M546 Pain in thoracic spine: Secondary | ICD-10-CM | POA: Diagnosis not present

## 2023-03-28 DIAGNOSIS — N2581 Secondary hyperparathyroidism of renal origin: Secondary | ICD-10-CM | POA: Diagnosis not present

## 2023-03-28 DIAGNOSIS — N186 End stage renal disease: Secondary | ICD-10-CM | POA: Diagnosis not present

## 2023-03-28 DIAGNOSIS — R52 Pain, unspecified: Secondary | ICD-10-CM | POA: Diagnosis not present

## 2023-03-28 DIAGNOSIS — D689 Coagulation defect, unspecified: Secondary | ICD-10-CM | POA: Diagnosis not present

## 2023-03-28 DIAGNOSIS — D631 Anemia in chronic kidney disease: Secondary | ICD-10-CM | POA: Diagnosis not present

## 2023-03-28 DIAGNOSIS — Z992 Dependence on renal dialysis: Secondary | ICD-10-CM | POA: Diagnosis not present

## 2023-03-28 DIAGNOSIS — R519 Headache, unspecified: Secondary | ICD-10-CM | POA: Diagnosis not present

## 2023-03-29 DIAGNOSIS — N186 End stage renal disease: Secondary | ICD-10-CM | POA: Diagnosis not present

## 2023-03-29 DIAGNOSIS — E8770 Fluid overload, unspecified: Secondary | ICD-10-CM | POA: Diagnosis not present

## 2023-03-29 DIAGNOSIS — Z992 Dependence on renal dialysis: Secondary | ICD-10-CM | POA: Diagnosis not present

## 2023-03-29 DIAGNOSIS — N2581 Secondary hyperparathyroidism of renal origin: Secondary | ICD-10-CM | POA: Diagnosis not present

## 2023-03-30 DIAGNOSIS — N2581 Secondary hyperparathyroidism of renal origin: Secondary | ICD-10-CM | POA: Diagnosis not present

## 2023-03-30 DIAGNOSIS — N186 End stage renal disease: Secondary | ICD-10-CM | POA: Diagnosis not present

## 2023-03-30 DIAGNOSIS — R519 Headache, unspecified: Secondary | ICD-10-CM | POA: Diagnosis not present

## 2023-03-30 DIAGNOSIS — D689 Coagulation defect, unspecified: Secondary | ICD-10-CM | POA: Diagnosis not present

## 2023-03-30 DIAGNOSIS — D631 Anemia in chronic kidney disease: Secondary | ICD-10-CM | POA: Diagnosis not present

## 2023-03-30 DIAGNOSIS — Z992 Dependence on renal dialysis: Secondary | ICD-10-CM | POA: Diagnosis not present

## 2023-03-30 DIAGNOSIS — R52 Pain, unspecified: Secondary | ICD-10-CM | POA: Diagnosis not present

## 2023-04-02 DIAGNOSIS — D631 Anemia in chronic kidney disease: Secondary | ICD-10-CM | POA: Diagnosis not present

## 2023-04-02 DIAGNOSIS — N186 End stage renal disease: Secondary | ICD-10-CM | POA: Diagnosis not present

## 2023-04-02 DIAGNOSIS — E1129 Type 2 diabetes mellitus with other diabetic kidney complication: Secondary | ICD-10-CM | POA: Diagnosis not present

## 2023-04-02 DIAGNOSIS — R52 Pain, unspecified: Secondary | ICD-10-CM | POA: Diagnosis not present

## 2023-04-02 DIAGNOSIS — N2581 Secondary hyperparathyroidism of renal origin: Secondary | ICD-10-CM | POA: Diagnosis not present

## 2023-04-02 DIAGNOSIS — D689 Coagulation defect, unspecified: Secondary | ICD-10-CM | POA: Diagnosis not present

## 2023-04-02 DIAGNOSIS — Z992 Dependence on renal dialysis: Secondary | ICD-10-CM | POA: Diagnosis not present

## 2023-04-02 DIAGNOSIS — R519 Headache, unspecified: Secondary | ICD-10-CM | POA: Diagnosis not present

## 2023-04-04 DIAGNOSIS — E1122 Type 2 diabetes mellitus with diabetic chronic kidney disease: Secondary | ICD-10-CM | POA: Diagnosis not present

## 2023-04-04 DIAGNOSIS — N2581 Secondary hyperparathyroidism of renal origin: Secondary | ICD-10-CM | POA: Diagnosis not present

## 2023-04-04 DIAGNOSIS — R519 Headache, unspecified: Secondary | ICD-10-CM | POA: Diagnosis not present

## 2023-04-04 DIAGNOSIS — D631 Anemia in chronic kidney disease: Secondary | ICD-10-CM | POA: Diagnosis not present

## 2023-04-04 DIAGNOSIS — D689 Coagulation defect, unspecified: Secondary | ICD-10-CM | POA: Diagnosis not present

## 2023-04-04 DIAGNOSIS — N186 End stage renal disease: Secondary | ICD-10-CM | POA: Diagnosis not present

## 2023-04-04 DIAGNOSIS — Z992 Dependence on renal dialysis: Secondary | ICD-10-CM | POA: Diagnosis not present

## 2023-04-04 DIAGNOSIS — Z23 Encounter for immunization: Secondary | ICD-10-CM | POA: Diagnosis not present

## 2023-04-06 DIAGNOSIS — Z992 Dependence on renal dialysis: Secondary | ICD-10-CM | POA: Diagnosis not present

## 2023-04-06 DIAGNOSIS — R519 Headache, unspecified: Secondary | ICD-10-CM | POA: Diagnosis not present

## 2023-04-06 DIAGNOSIS — N186 End stage renal disease: Secondary | ICD-10-CM | POA: Diagnosis not present

## 2023-04-06 DIAGNOSIS — N2581 Secondary hyperparathyroidism of renal origin: Secondary | ICD-10-CM | POA: Diagnosis not present

## 2023-04-06 DIAGNOSIS — D631 Anemia in chronic kidney disease: Secondary | ICD-10-CM | POA: Diagnosis not present

## 2023-04-06 DIAGNOSIS — E1122 Type 2 diabetes mellitus with diabetic chronic kidney disease: Secondary | ICD-10-CM | POA: Diagnosis not present

## 2023-04-06 DIAGNOSIS — D689 Coagulation defect, unspecified: Secondary | ICD-10-CM | POA: Diagnosis not present

## 2023-04-06 DIAGNOSIS — Z23 Encounter for immunization: Secondary | ICD-10-CM | POA: Diagnosis not present

## 2023-04-09 DIAGNOSIS — Z23 Encounter for immunization: Secondary | ICD-10-CM | POA: Diagnosis not present

## 2023-04-09 DIAGNOSIS — N186 End stage renal disease: Secondary | ICD-10-CM | POA: Diagnosis not present

## 2023-04-09 DIAGNOSIS — R519 Headache, unspecified: Secondary | ICD-10-CM | POA: Diagnosis not present

## 2023-04-09 DIAGNOSIS — E1122 Type 2 diabetes mellitus with diabetic chronic kidney disease: Secondary | ICD-10-CM | POA: Diagnosis not present

## 2023-04-09 DIAGNOSIS — Z992 Dependence on renal dialysis: Secondary | ICD-10-CM | POA: Diagnosis not present

## 2023-04-09 DIAGNOSIS — N2581 Secondary hyperparathyroidism of renal origin: Secondary | ICD-10-CM | POA: Diagnosis not present

## 2023-04-09 DIAGNOSIS — D689 Coagulation defect, unspecified: Secondary | ICD-10-CM | POA: Diagnosis not present

## 2023-04-09 DIAGNOSIS — D631 Anemia in chronic kidney disease: Secondary | ICD-10-CM | POA: Diagnosis not present

## 2023-04-10 DIAGNOSIS — M546 Pain in thoracic spine: Secondary | ICD-10-CM | POA: Diagnosis not present

## 2023-04-11 DIAGNOSIS — D631 Anemia in chronic kidney disease: Secondary | ICD-10-CM | POA: Diagnosis not present

## 2023-04-11 DIAGNOSIS — Z992 Dependence on renal dialysis: Secondary | ICD-10-CM | POA: Diagnosis not present

## 2023-04-11 DIAGNOSIS — N186 End stage renal disease: Secondary | ICD-10-CM | POA: Diagnosis not present

## 2023-04-11 DIAGNOSIS — N2581 Secondary hyperparathyroidism of renal origin: Secondary | ICD-10-CM | POA: Diagnosis not present

## 2023-04-11 DIAGNOSIS — E1122 Type 2 diabetes mellitus with diabetic chronic kidney disease: Secondary | ICD-10-CM | POA: Diagnosis not present

## 2023-04-11 DIAGNOSIS — Z23 Encounter for immunization: Secondary | ICD-10-CM | POA: Diagnosis not present

## 2023-04-11 DIAGNOSIS — D689 Coagulation defect, unspecified: Secondary | ICD-10-CM | POA: Diagnosis not present

## 2023-04-11 DIAGNOSIS — R519 Headache, unspecified: Secondary | ICD-10-CM | POA: Diagnosis not present

## 2023-04-13 DIAGNOSIS — Z992 Dependence on renal dialysis: Secondary | ICD-10-CM | POA: Diagnosis not present

## 2023-04-13 DIAGNOSIS — N2581 Secondary hyperparathyroidism of renal origin: Secondary | ICD-10-CM | POA: Diagnosis not present

## 2023-04-13 DIAGNOSIS — R519 Headache, unspecified: Secondary | ICD-10-CM | POA: Diagnosis not present

## 2023-04-13 DIAGNOSIS — Z23 Encounter for immunization: Secondary | ICD-10-CM | POA: Diagnosis not present

## 2023-04-13 DIAGNOSIS — N186 End stage renal disease: Secondary | ICD-10-CM | POA: Diagnosis not present

## 2023-04-13 DIAGNOSIS — D631 Anemia in chronic kidney disease: Secondary | ICD-10-CM | POA: Diagnosis not present

## 2023-04-13 DIAGNOSIS — D689 Coagulation defect, unspecified: Secondary | ICD-10-CM | POA: Diagnosis not present

## 2023-04-13 DIAGNOSIS — E1122 Type 2 diabetes mellitus with diabetic chronic kidney disease: Secondary | ICD-10-CM | POA: Diagnosis not present

## 2023-04-16 DIAGNOSIS — D631 Anemia in chronic kidney disease: Secondary | ICD-10-CM | POA: Diagnosis not present

## 2023-04-16 DIAGNOSIS — D689 Coagulation defect, unspecified: Secondary | ICD-10-CM | POA: Diagnosis not present

## 2023-04-16 DIAGNOSIS — N186 End stage renal disease: Secondary | ICD-10-CM | POA: Diagnosis not present

## 2023-04-16 DIAGNOSIS — N2581 Secondary hyperparathyroidism of renal origin: Secondary | ICD-10-CM | POA: Diagnosis not present

## 2023-04-16 DIAGNOSIS — R519 Headache, unspecified: Secondary | ICD-10-CM | POA: Diagnosis not present

## 2023-04-16 DIAGNOSIS — Z992 Dependence on renal dialysis: Secondary | ICD-10-CM | POA: Diagnosis not present

## 2023-04-16 DIAGNOSIS — Z23 Encounter for immunization: Secondary | ICD-10-CM | POA: Diagnosis not present

## 2023-04-16 DIAGNOSIS — E1122 Type 2 diabetes mellitus with diabetic chronic kidney disease: Secondary | ICD-10-CM | POA: Diagnosis not present

## 2023-04-17 DIAGNOSIS — M546 Pain in thoracic spine: Secondary | ICD-10-CM | POA: Diagnosis not present

## 2023-04-17 IMAGING — DX DG CHEST 1V PORT
1 series · 1 of 1 positions shown · non-contrast
Comparison: 08/25/2021

CLINICAL DATA: Short of breath

EXAM:
PORTABLE CHEST 1 VIEW

[chest ap]
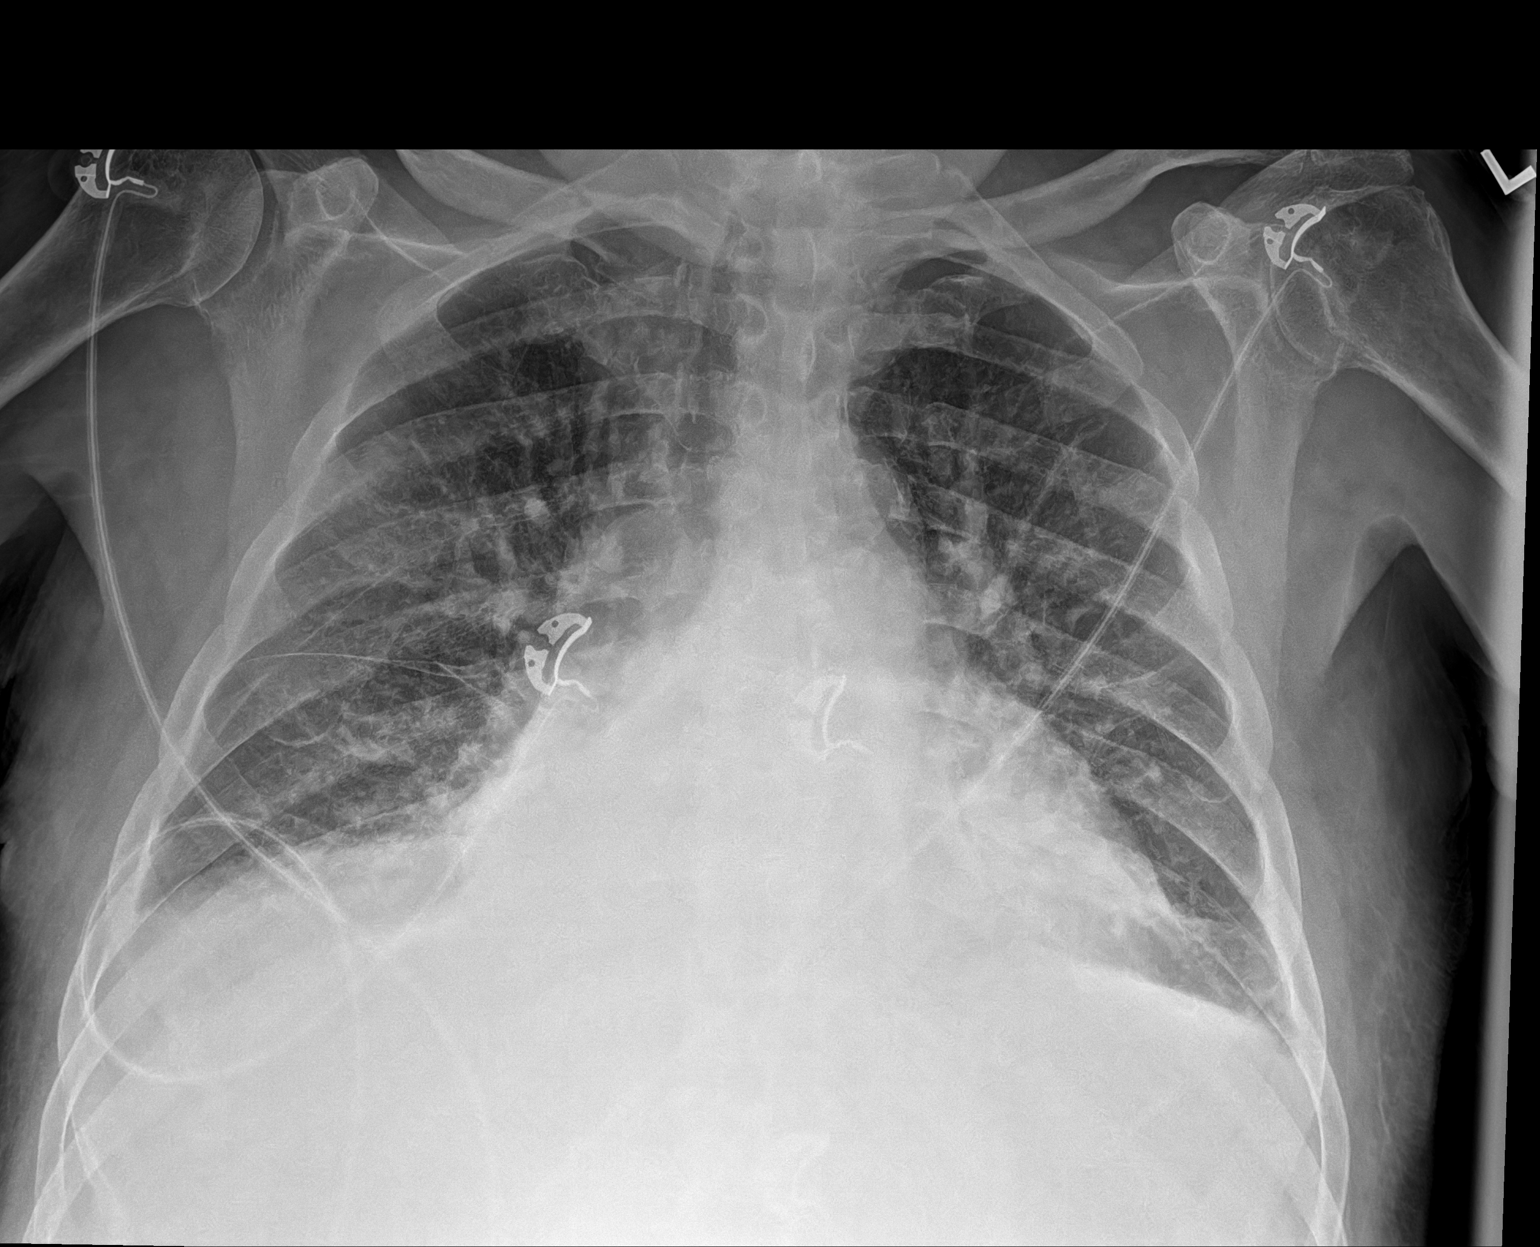

[1 of 1 positions shown; findings below may reference images not displayed]

FINDINGS: Cardiac enlargement with mild vascular congestion. Mild bibasilar
atelectasis or infiltrate has progressed. Small bilateral effusions
are slightly improved.
IMPRESSION: Mild fluid overload with cardiac enlargement, vascular congestion
and small effusion

Mild bibasilar atelectasis/infiltrate.

## 2023-04-18 DIAGNOSIS — E1122 Type 2 diabetes mellitus with diabetic chronic kidney disease: Secondary | ICD-10-CM | POA: Diagnosis not present

## 2023-04-18 DIAGNOSIS — R519 Headache, unspecified: Secondary | ICD-10-CM | POA: Diagnosis not present

## 2023-04-18 DIAGNOSIS — Z992 Dependence on renal dialysis: Secondary | ICD-10-CM | POA: Diagnosis not present

## 2023-04-18 DIAGNOSIS — N2581 Secondary hyperparathyroidism of renal origin: Secondary | ICD-10-CM | POA: Diagnosis not present

## 2023-04-18 DIAGNOSIS — D689 Coagulation defect, unspecified: Secondary | ICD-10-CM | POA: Diagnosis not present

## 2023-04-18 DIAGNOSIS — N186 End stage renal disease: Secondary | ICD-10-CM | POA: Diagnosis not present

## 2023-04-18 DIAGNOSIS — Z23 Encounter for immunization: Secondary | ICD-10-CM | POA: Diagnosis not present

## 2023-04-18 DIAGNOSIS — D631 Anemia in chronic kidney disease: Secondary | ICD-10-CM | POA: Diagnosis not present

## 2023-04-19 IMAGING — US IR TUNNELED CV CATH W/O PORT/PUMP >5YO
1 series · 2 of 2 positions shown · non-contrast
Comparison: none

INDICATION: End-stage renal disease, no current permanent access

[Series 1: ir tunneled cv cath w/o port/pump >5yo · 2 of 2 slices shown]
[im 1/2]
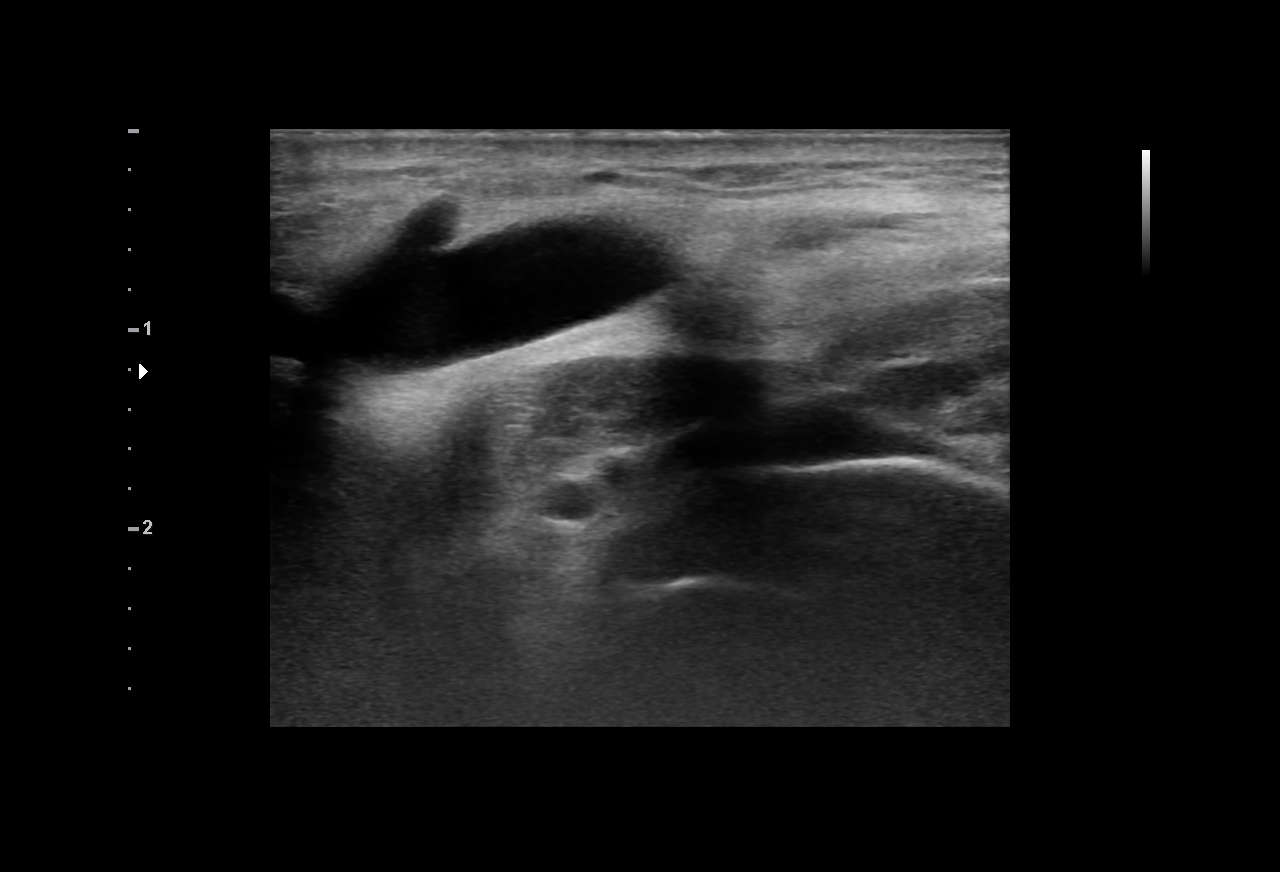
[im 2/2]
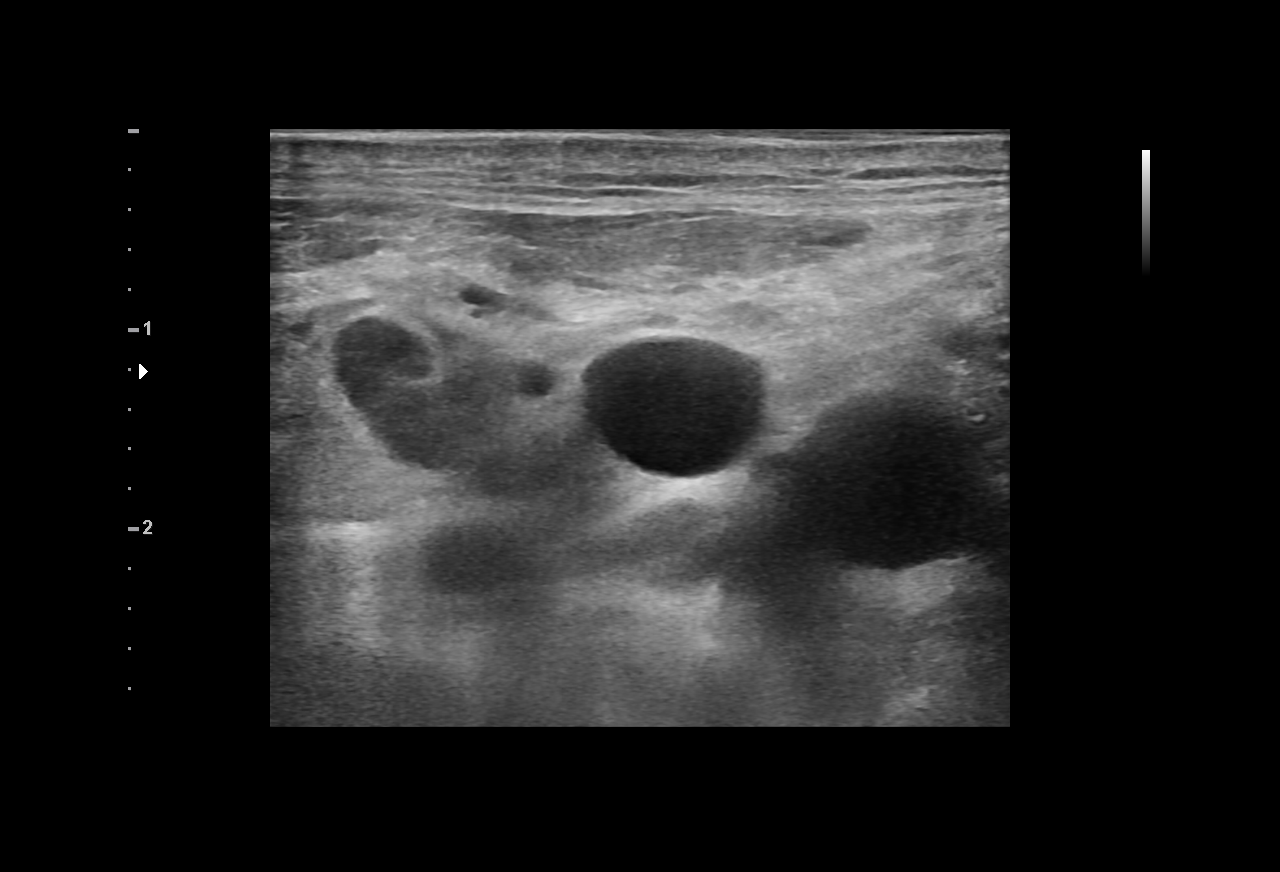

[2 of 2 positions shown; findings below may reference images not displayed]

EXAM:
ULTRASOUND GUIDANCE FOR VASCULAR ACCESS

RIGHT INTERNAL JUGULAR PERMANENT HEMODIALYSIS CATHETER

Radiologist:  Dinye, Satsyi

Guidance:  Ultrasound and fluoroscopic

FLUOROSCOPY:
Fluoroscopy Time: 0 minutes 54 seconds (6.2 mGy).

MEDICATIONS:
Patient is already receiving IV antibiotics as an inpatient

ANESTHESIA/SEDATION:
Versed 0.5 mg IV; Fentanyl 25 mcg IV;

Moderate Sedation Time:  16 minute

The patient was continuously monitored during the procedure by the
interventional radiology nurse under my direct supervision.

CONTRAST:  None.

COMPLICATIONS:
None immediate.

PROCEDURE:
Informed consent was obtained from the patient following explanation
of the procedure, risks, benefits and alternatives. The patient
understands, agrees and consents for the procedure. All questions
were addressed. A time out was performed.

Maximal barrier sterile technique utilized including caps, mask,
sterile gowns, sterile gloves, large sterile drape, hand hygiene,
and 2% chlorhexidine scrub.

Under sterile conditions and local anesthesia, right internal
jugular micropuncture venous access was performed with ultrasound.
Images were obtained for documentation of the patent right internal
jugular vein. A guide wire was inserted followed by a transitional
dilator. Next, a 0.035 guidewire was advanced into the IVC with a
5-French catheter. Measurements were obtained from the right
venotomy site to the proximal right atrium. In the right
infraclavicular chest, a subcutaneous tunnel was created under
sterile conditions and local anesthesia. 1% lidocaine with
epinephrine was utilized for this. The 19 cm tip to cuff palindrome
catheter was tunneled subcutaneously to the venotomy site and
inserted into the SVC/RA junction through a valved peel-away sheath.
Position was confirmed with fluoroscopy. Images were obtained for
documentation. Blood was aspirated from the catheter followed by
saline and heparin flushes. The appropriate volume and strength of
heparin was instilled in each lumen. Caps were applied. The catheter
was secured at the tunnel site with Gelfoam and a pursestring
suture. The venotomy site was closed with subcuticular Vicryl
suture. Dermabond was applied to the small right neck incision. A
dry sterile dressing was applied. The catheter is ready for use. No
immediate complications.
IMPRESSION: Ultrasound and fluoroscopically guided right internal jugular
tunneled hemodialysis catheter (under 19 cm tip to cuff palindrome
catheter).

## 2023-04-20 DIAGNOSIS — E1122 Type 2 diabetes mellitus with diabetic chronic kidney disease: Secondary | ICD-10-CM | POA: Diagnosis not present

## 2023-04-20 DIAGNOSIS — Z992 Dependence on renal dialysis: Secondary | ICD-10-CM | POA: Diagnosis not present

## 2023-04-20 DIAGNOSIS — R519 Headache, unspecified: Secondary | ICD-10-CM | POA: Diagnosis not present

## 2023-04-20 DIAGNOSIS — N2581 Secondary hyperparathyroidism of renal origin: Secondary | ICD-10-CM | POA: Diagnosis not present

## 2023-04-20 DIAGNOSIS — N186 End stage renal disease: Secondary | ICD-10-CM | POA: Diagnosis not present

## 2023-04-20 DIAGNOSIS — Z23 Encounter for immunization: Secondary | ICD-10-CM | POA: Diagnosis not present

## 2023-04-20 DIAGNOSIS — D631 Anemia in chronic kidney disease: Secondary | ICD-10-CM | POA: Diagnosis not present

## 2023-04-20 DIAGNOSIS — D689 Coagulation defect, unspecified: Secondary | ICD-10-CM | POA: Diagnosis not present

## 2023-04-20 IMAGING — DX DG ABD PORTABLE 1V
1 series · 1 of 1 positions shown · non-contrast
Comparison: None.

CLINICAL DATA: Orogastric tube placement.

EXAM:
PORTABLE ABDOMEN - 1 VIEW

[abdomen supine]
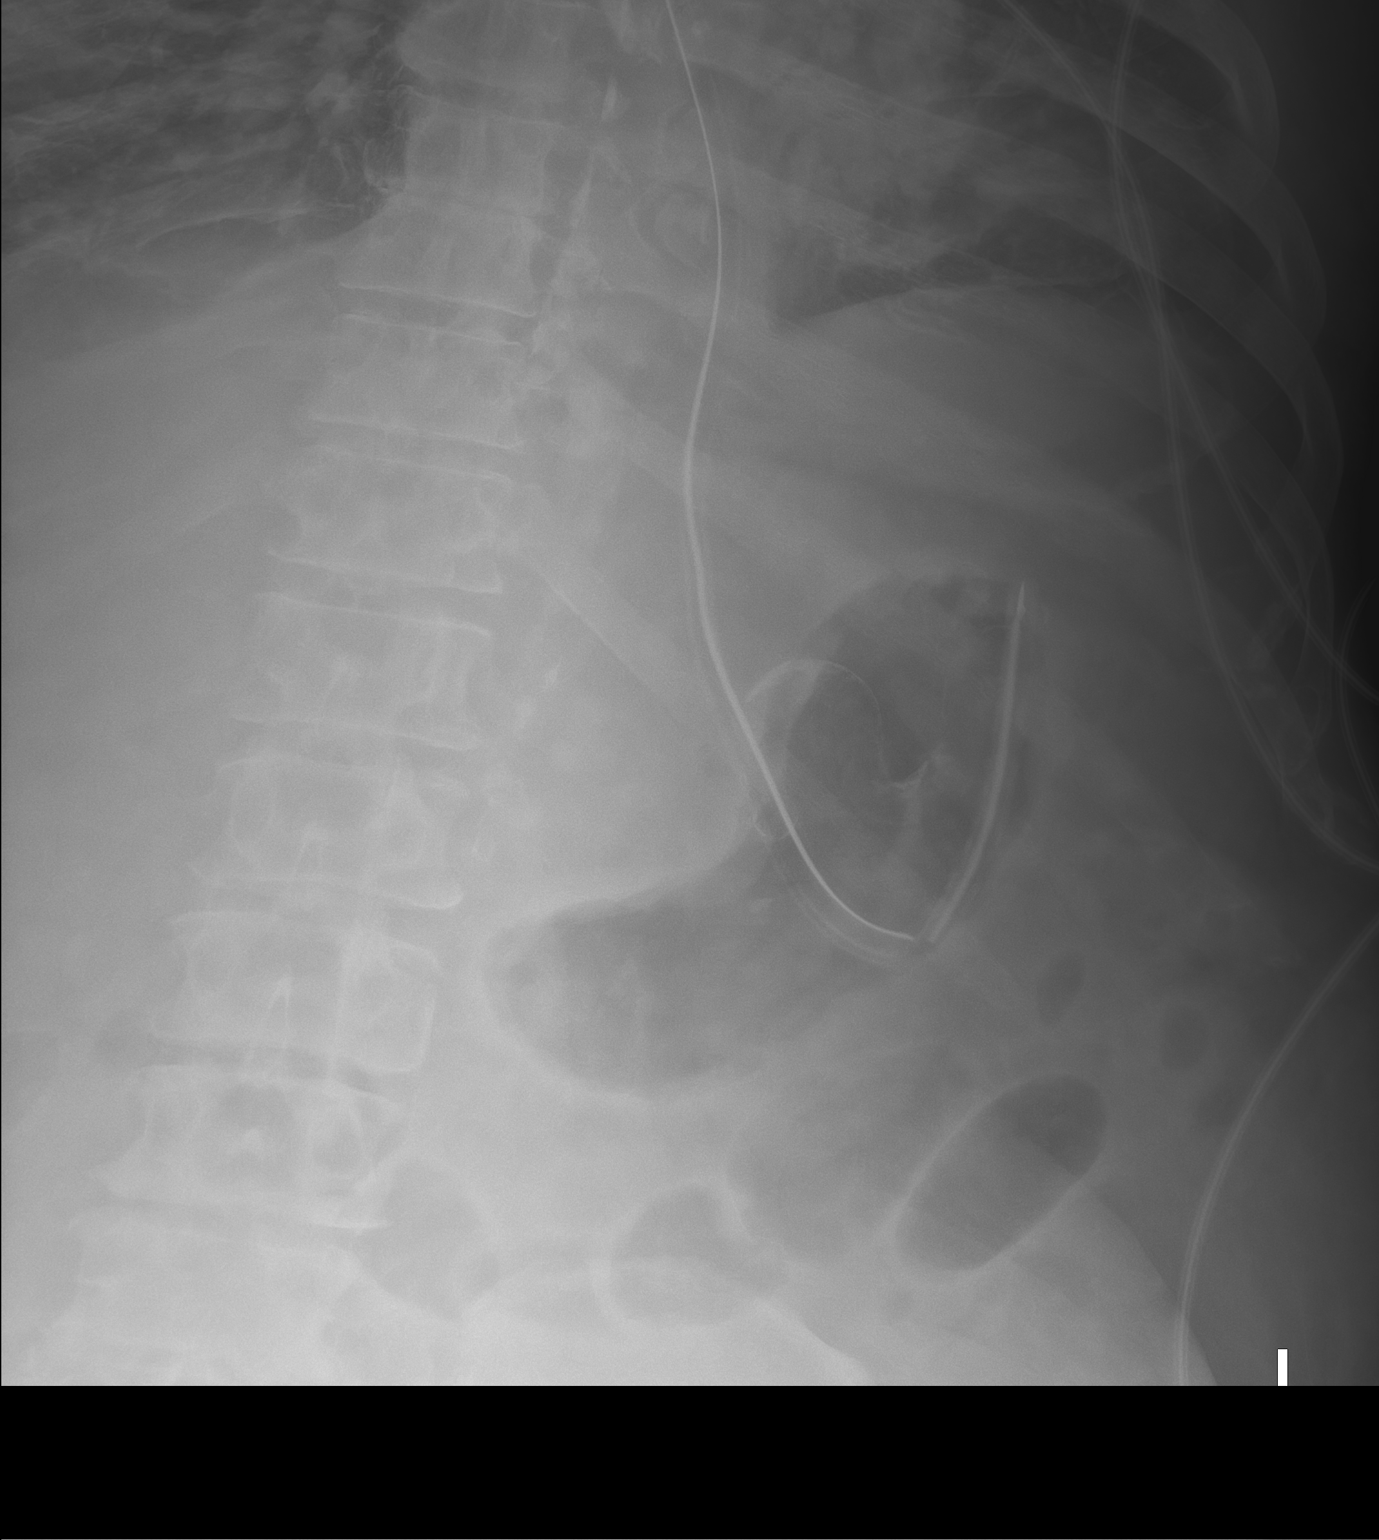

[1 of 1 positions shown; findings below may reference images not displayed]

FINDINGS: The bowel gas pattern is normal. An enteric tube terminates in the
stomach. No radio-opaque calculi. Vascular calcifications are noted
in the left upper quadrant. There is atherosclerotic calcification
of the aorta. Patchy infiltrates are present at the lung bases.
IMPRESSION: The enteric tube terminates in the stomach.

## 2023-04-20 IMAGING — DX DG CHEST 1V PORT
1 series · 1 of 1 positions shown · non-contrast
Comparison: 10/13/2021

CLINICAL DATA: 44765.  Acute respiratory distress

EXAM:
PORTABLE CHEST 1 VIEW

[chest ap]
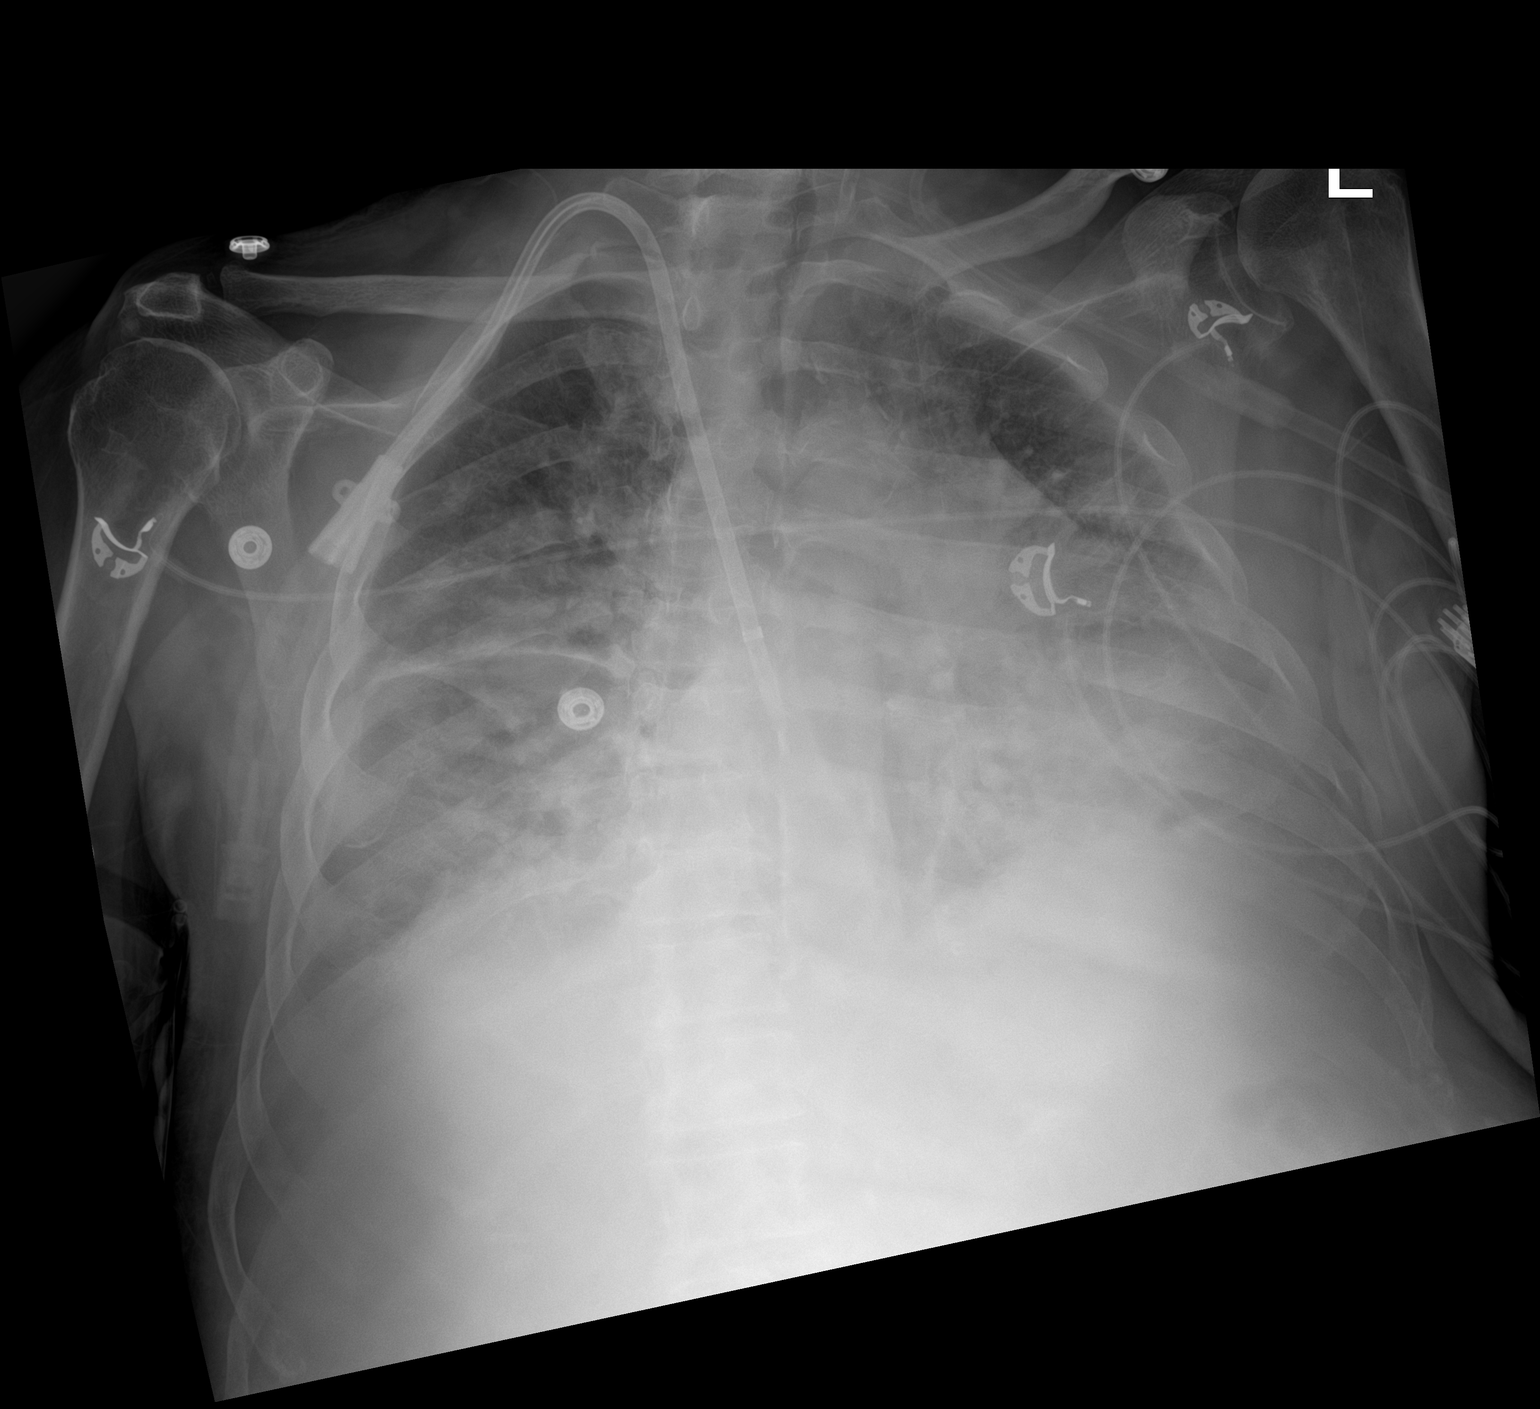

[1 of 1 positions shown; findings below may reference images not displayed]

FINDINGS: Interval right internal jugular hemodialysis catheter placement with
its tip overlying the expected superior right atrium. Lung volumes
are small and pulmonary insufflation has diminished since prior
examination. Bibasilar pulmonary infiltrates have developed, a
infection versus edema. Small bilateral pleural effusions are
present. No pneumothorax. Cardiac size is within normal limits when
accounting for poor pulmonary insufflation.
IMPRESSION: Right internal jugular hemodialysis catheter tip within the right
atrium. No pneumothorax.

Development of bibasilar pulmonary infiltrate, infection versus
edema. Small bilateral associated pleural effusions.

## 2023-04-20 IMAGING — DX DG CHEST 1V PORT
1 series · 1 of 1 positions shown · non-contrast
Comparison: 10/16/2021.

CLINICAL DATA: Endotracheal intubation.

EXAM:
PORTABLE CHEST 1 VIEW

[chest ap]
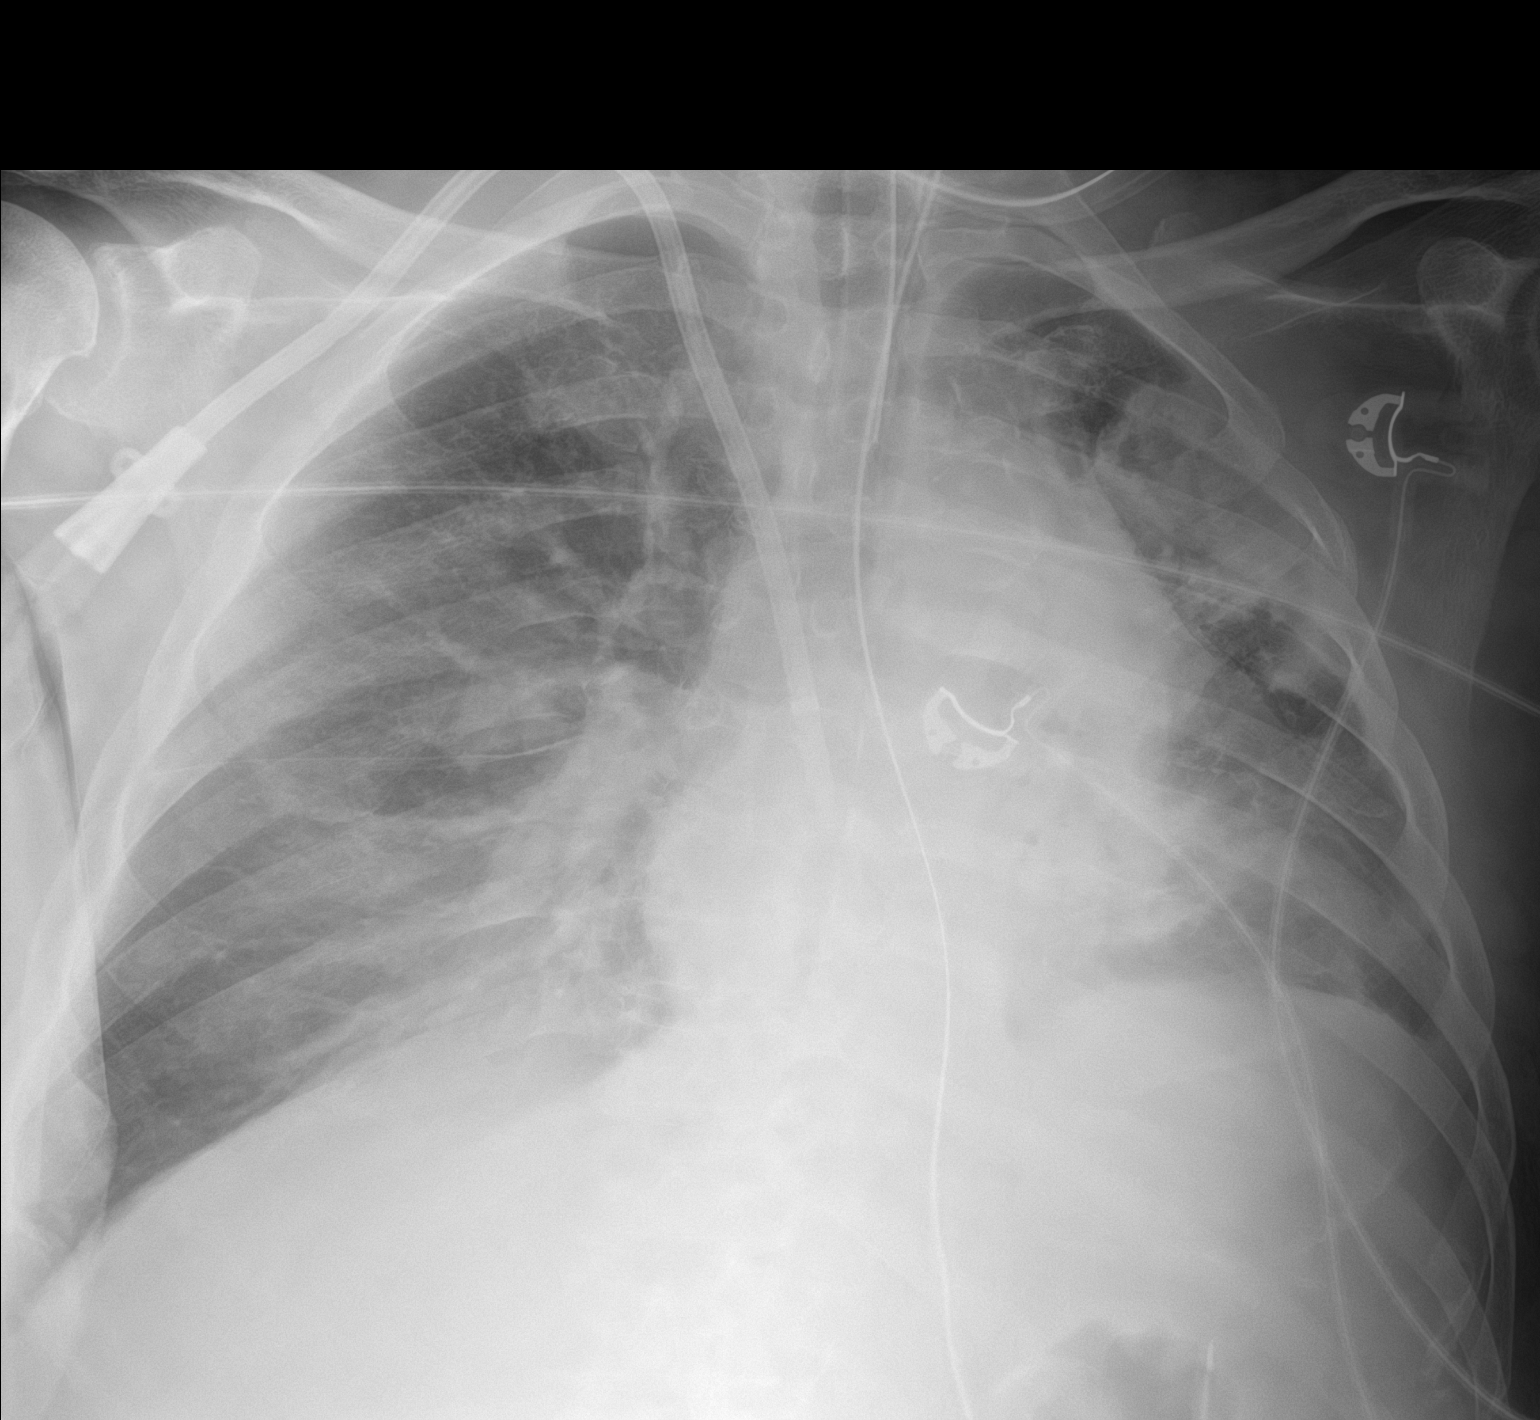

[1 of 1 positions shown; findings below may reference images not displayed]

FINDINGS: The heart is enlarged the mediastinal contour stable. The pulmonary
vasculature is distended. Mild consolidation is noted in the left
upper lobe. There is patchy airspace disease in the mid to lower
lung fields bilaterally. No effusion or pneumothorax. A right
internal jugular central venous catheter stable. The endotracheal
tube is 4.3 cm above the carina. An enteric tube terminates in the
stomach.
IMPRESSION: 1. Cardiomegaly with pulmonary vascular congestion.
2. Patchy airspace disease in the lungs bilaterally with
consolidation in the left upper lobe, concerning for multifocal
pneumonia.

## 2023-04-23 DIAGNOSIS — R519 Headache, unspecified: Secondary | ICD-10-CM | POA: Diagnosis not present

## 2023-04-23 DIAGNOSIS — N186 End stage renal disease: Secondary | ICD-10-CM | POA: Diagnosis not present

## 2023-04-23 DIAGNOSIS — Z992 Dependence on renal dialysis: Secondary | ICD-10-CM | POA: Diagnosis not present

## 2023-04-23 DIAGNOSIS — D631 Anemia in chronic kidney disease: Secondary | ICD-10-CM | POA: Diagnosis not present

## 2023-04-23 DIAGNOSIS — Z23 Encounter for immunization: Secondary | ICD-10-CM | POA: Diagnosis not present

## 2023-04-23 DIAGNOSIS — D689 Coagulation defect, unspecified: Secondary | ICD-10-CM | POA: Diagnosis not present

## 2023-04-23 DIAGNOSIS — N2581 Secondary hyperparathyroidism of renal origin: Secondary | ICD-10-CM | POA: Diagnosis not present

## 2023-04-23 DIAGNOSIS — E1122 Type 2 diabetes mellitus with diabetic chronic kidney disease: Secondary | ICD-10-CM | POA: Diagnosis not present

## 2023-04-25 DIAGNOSIS — Z23 Encounter for immunization: Secondary | ICD-10-CM | POA: Diagnosis not present

## 2023-04-25 DIAGNOSIS — D689 Coagulation defect, unspecified: Secondary | ICD-10-CM | POA: Diagnosis not present

## 2023-04-25 DIAGNOSIS — E1122 Type 2 diabetes mellitus with diabetic chronic kidney disease: Secondary | ICD-10-CM | POA: Diagnosis not present

## 2023-04-25 DIAGNOSIS — D631 Anemia in chronic kidney disease: Secondary | ICD-10-CM | POA: Diagnosis not present

## 2023-04-25 DIAGNOSIS — R519 Headache, unspecified: Secondary | ICD-10-CM | POA: Diagnosis not present

## 2023-04-25 DIAGNOSIS — Z992 Dependence on renal dialysis: Secondary | ICD-10-CM | POA: Diagnosis not present

## 2023-04-25 DIAGNOSIS — N2581 Secondary hyperparathyroidism of renal origin: Secondary | ICD-10-CM | POA: Diagnosis not present

## 2023-04-25 DIAGNOSIS — N186 End stage renal disease: Secondary | ICD-10-CM | POA: Diagnosis not present

## 2023-04-27 DIAGNOSIS — R519 Headache, unspecified: Secondary | ICD-10-CM | POA: Diagnosis not present

## 2023-04-27 DIAGNOSIS — N186 End stage renal disease: Secondary | ICD-10-CM | POA: Diagnosis not present

## 2023-04-27 DIAGNOSIS — D631 Anemia in chronic kidney disease: Secondary | ICD-10-CM | POA: Diagnosis not present

## 2023-04-27 DIAGNOSIS — Z992 Dependence on renal dialysis: Secondary | ICD-10-CM | POA: Diagnosis not present

## 2023-04-27 DIAGNOSIS — E1122 Type 2 diabetes mellitus with diabetic chronic kidney disease: Secondary | ICD-10-CM | POA: Diagnosis not present

## 2023-04-27 DIAGNOSIS — Z23 Encounter for immunization: Secondary | ICD-10-CM | POA: Diagnosis not present

## 2023-04-27 DIAGNOSIS — N2581 Secondary hyperparathyroidism of renal origin: Secondary | ICD-10-CM | POA: Diagnosis not present

## 2023-04-27 DIAGNOSIS — D689 Coagulation defect, unspecified: Secondary | ICD-10-CM | POA: Diagnosis not present

## 2023-04-30 DIAGNOSIS — Z992 Dependence on renal dialysis: Secondary | ICD-10-CM | POA: Diagnosis not present

## 2023-04-30 DIAGNOSIS — Z23 Encounter for immunization: Secondary | ICD-10-CM | POA: Diagnosis not present

## 2023-04-30 DIAGNOSIS — R519 Headache, unspecified: Secondary | ICD-10-CM | POA: Diagnosis not present

## 2023-04-30 DIAGNOSIS — N186 End stage renal disease: Secondary | ICD-10-CM | POA: Diagnosis not present

## 2023-04-30 DIAGNOSIS — E1122 Type 2 diabetes mellitus with diabetic chronic kidney disease: Secondary | ICD-10-CM | POA: Diagnosis not present

## 2023-04-30 DIAGNOSIS — N2581 Secondary hyperparathyroidism of renal origin: Secondary | ICD-10-CM | POA: Diagnosis not present

## 2023-04-30 DIAGNOSIS — D631 Anemia in chronic kidney disease: Secondary | ICD-10-CM | POA: Diagnosis not present

## 2023-04-30 DIAGNOSIS — D689 Coagulation defect, unspecified: Secondary | ICD-10-CM | POA: Diagnosis not present

## 2023-05-02 DIAGNOSIS — Z992 Dependence on renal dialysis: Secondary | ICD-10-CM | POA: Diagnosis not present

## 2023-05-02 DIAGNOSIS — Z23 Encounter for immunization: Secondary | ICD-10-CM | POA: Diagnosis not present

## 2023-05-02 DIAGNOSIS — N2581 Secondary hyperparathyroidism of renal origin: Secondary | ICD-10-CM | POA: Diagnosis not present

## 2023-05-02 DIAGNOSIS — E1122 Type 2 diabetes mellitus with diabetic chronic kidney disease: Secondary | ICD-10-CM | POA: Diagnosis not present

## 2023-05-02 DIAGNOSIS — N186 End stage renal disease: Secondary | ICD-10-CM | POA: Diagnosis not present

## 2023-05-02 DIAGNOSIS — D631 Anemia in chronic kidney disease: Secondary | ICD-10-CM | POA: Diagnosis not present

## 2023-05-02 DIAGNOSIS — D689 Coagulation defect, unspecified: Secondary | ICD-10-CM | POA: Diagnosis not present

## 2023-05-02 DIAGNOSIS — R519 Headache, unspecified: Secondary | ICD-10-CM | POA: Diagnosis not present

## 2023-05-03 DIAGNOSIS — Z992 Dependence on renal dialysis: Secondary | ICD-10-CM | POA: Diagnosis not present

## 2023-05-03 DIAGNOSIS — N186 End stage renal disease: Secondary | ICD-10-CM | POA: Diagnosis not present

## 2023-05-03 DIAGNOSIS — E1129 Type 2 diabetes mellitus with other diabetic kidney complication: Secondary | ICD-10-CM | POA: Diagnosis not present

## 2023-05-04 DIAGNOSIS — N186 End stage renal disease: Secondary | ICD-10-CM | POA: Diagnosis not present

## 2023-05-04 DIAGNOSIS — N2581 Secondary hyperparathyroidism of renal origin: Secondary | ICD-10-CM | POA: Diagnosis not present

## 2023-05-04 DIAGNOSIS — Z992 Dependence on renal dialysis: Secondary | ICD-10-CM | POA: Diagnosis not present

## 2023-05-04 DIAGNOSIS — D689 Coagulation defect, unspecified: Secondary | ICD-10-CM | POA: Diagnosis not present

## 2023-05-07 DIAGNOSIS — D689 Coagulation defect, unspecified: Secondary | ICD-10-CM | POA: Diagnosis not present

## 2023-05-07 DIAGNOSIS — Z992 Dependence on renal dialysis: Secondary | ICD-10-CM | POA: Diagnosis not present

## 2023-05-07 DIAGNOSIS — N186 End stage renal disease: Secondary | ICD-10-CM | POA: Diagnosis not present

## 2023-05-07 DIAGNOSIS — N2581 Secondary hyperparathyroidism of renal origin: Secondary | ICD-10-CM | POA: Diagnosis not present

## 2023-05-08 DIAGNOSIS — T82858A Stenosis of vascular prosthetic devices, implants and grafts, initial encounter: Secondary | ICD-10-CM | POA: Diagnosis not present

## 2023-05-08 DIAGNOSIS — Z992 Dependence on renal dialysis: Secondary | ICD-10-CM | POA: Diagnosis not present

## 2023-05-08 DIAGNOSIS — I871 Compression of vein: Secondary | ICD-10-CM | POA: Diagnosis not present

## 2023-05-08 DIAGNOSIS — N186 End stage renal disease: Secondary | ICD-10-CM | POA: Diagnosis not present

## 2023-05-09 DIAGNOSIS — Z992 Dependence on renal dialysis: Secondary | ICD-10-CM | POA: Diagnosis not present

## 2023-05-09 DIAGNOSIS — D689 Coagulation defect, unspecified: Secondary | ICD-10-CM | POA: Diagnosis not present

## 2023-05-09 DIAGNOSIS — N186 End stage renal disease: Secondary | ICD-10-CM | POA: Diagnosis not present

## 2023-05-09 DIAGNOSIS — N2581 Secondary hyperparathyroidism of renal origin: Secondary | ICD-10-CM | POA: Diagnosis not present

## 2023-05-10 DIAGNOSIS — N186 End stage renal disease: Secondary | ICD-10-CM | POA: Diagnosis not present

## 2023-05-10 DIAGNOSIS — Z992 Dependence on renal dialysis: Secondary | ICD-10-CM | POA: Diagnosis not present

## 2023-05-10 DIAGNOSIS — N2581 Secondary hyperparathyroidism of renal origin: Secondary | ICD-10-CM | POA: Diagnosis not present

## 2023-05-10 DIAGNOSIS — E8770 Fluid overload, unspecified: Secondary | ICD-10-CM | POA: Diagnosis not present

## 2023-05-11 DIAGNOSIS — N186 End stage renal disease: Secondary | ICD-10-CM | POA: Diagnosis not present

## 2023-05-11 DIAGNOSIS — D689 Coagulation defect, unspecified: Secondary | ICD-10-CM | POA: Diagnosis not present

## 2023-05-11 DIAGNOSIS — N2581 Secondary hyperparathyroidism of renal origin: Secondary | ICD-10-CM | POA: Diagnosis not present

## 2023-05-11 DIAGNOSIS — Z992 Dependence on renal dialysis: Secondary | ICD-10-CM | POA: Diagnosis not present

## 2023-05-14 DIAGNOSIS — N186 End stage renal disease: Secondary | ICD-10-CM | POA: Diagnosis not present

## 2023-05-14 DIAGNOSIS — N2581 Secondary hyperparathyroidism of renal origin: Secondary | ICD-10-CM | POA: Diagnosis not present

## 2023-05-14 DIAGNOSIS — Z992 Dependence on renal dialysis: Secondary | ICD-10-CM | POA: Diagnosis not present

## 2023-05-14 DIAGNOSIS — D689 Coagulation defect, unspecified: Secondary | ICD-10-CM | POA: Diagnosis not present

## 2023-05-16 DIAGNOSIS — D689 Coagulation defect, unspecified: Secondary | ICD-10-CM | POA: Diagnosis not present

## 2023-05-16 DIAGNOSIS — N186 End stage renal disease: Secondary | ICD-10-CM | POA: Diagnosis not present

## 2023-05-16 DIAGNOSIS — Z992 Dependence on renal dialysis: Secondary | ICD-10-CM | POA: Diagnosis not present

## 2023-05-16 DIAGNOSIS — N2581 Secondary hyperparathyroidism of renal origin: Secondary | ICD-10-CM | POA: Diagnosis not present

## 2023-05-18 ENCOUNTER — Observation Stay (HOSPITAL_COMMUNITY): Payer: Medicare Other

## 2023-05-18 ENCOUNTER — Emergency Department (HOSPITAL_COMMUNITY): Payer: Medicare Other

## 2023-05-18 ENCOUNTER — Inpatient Hospital Stay (HOSPITAL_COMMUNITY)
Admission: EM | Admit: 2023-05-18 | Discharge: 2023-05-30 | DRG: 308 | Disposition: A | Discharge status: Skilled Nursing Facility | Payer: Medicare Other | Source: Other Acute Inpatient Hospital | Attending: Internal Medicine | Admitting: Internal Medicine

## 2023-05-18 DIAGNOSIS — E785 Hyperlipidemia, unspecified: Secondary | ICD-10-CM | POA: Diagnosis present

## 2023-05-18 DIAGNOSIS — R053 Chronic cough: Secondary | ICD-10-CM | POA: Diagnosis not present

## 2023-05-18 DIAGNOSIS — N400 Enlarged prostate without lower urinary tract symptoms: Secondary | ICD-10-CM | POA: Diagnosis present

## 2023-05-18 DIAGNOSIS — E669 Obesity, unspecified: Secondary | ICD-10-CM | POA: Diagnosis present

## 2023-05-18 DIAGNOSIS — D631 Anemia in chronic kidney disease: Secondary | ICD-10-CM | POA: Diagnosis not present

## 2023-05-18 DIAGNOSIS — I340 Nonrheumatic mitral (valve) insufficiency: Secondary | ICD-10-CM | POA: Diagnosis not present

## 2023-05-18 DIAGNOSIS — D696 Thrombocytopenia, unspecified: Secondary | ICD-10-CM | POA: Diagnosis present

## 2023-05-18 DIAGNOSIS — R7989 Other specified abnormal findings of blood chemistry: Secondary | ICD-10-CM | POA: Diagnosis not present

## 2023-05-18 DIAGNOSIS — D689 Coagulation defect, unspecified: Secondary | ICD-10-CM | POA: Diagnosis not present

## 2023-05-18 DIAGNOSIS — I10 Essential (primary) hypertension: Secondary | ICD-10-CM | POA: Diagnosis not present

## 2023-05-18 DIAGNOSIS — N186 End stage renal disease: Secondary | ICD-10-CM | POA: Diagnosis present

## 2023-05-18 DIAGNOSIS — S90412A Abrasion, left great toe, initial encounter: Secondary | ICD-10-CM

## 2023-05-18 DIAGNOSIS — I5042 Chronic combined systolic (congestive) and diastolic (congestive) heart failure: Secondary | ICD-10-CM | POA: Diagnosis present

## 2023-05-18 DIAGNOSIS — E1142 Type 2 diabetes mellitus with diabetic polyneuropathy: Secondary | ICD-10-CM

## 2023-05-18 DIAGNOSIS — E782 Mixed hyperlipidemia: Secondary | ICD-10-CM

## 2023-05-18 DIAGNOSIS — Z823 Family history of stroke: Secondary | ICD-10-CM | POA: Diagnosis not present

## 2023-05-18 DIAGNOSIS — R932 Abnormal findings on diagnostic imaging of liver and biliary tract: Secondary | ICD-10-CM | POA: Diagnosis not present

## 2023-05-18 DIAGNOSIS — R000 Tachycardia, unspecified: Secondary | ICD-10-CM | POA: Diagnosis not present

## 2023-05-18 DIAGNOSIS — N189 Chronic kidney disease, unspecified: Secondary | ICD-10-CM

## 2023-05-18 DIAGNOSIS — Z96641 Presence of right artificial hip joint: Secondary | ICD-10-CM | POA: Diagnosis present

## 2023-05-18 DIAGNOSIS — I129 Hypertensive chronic kidney disease with stage 1 through stage 4 chronic kidney disease, or unspecified chronic kidney disease: Secondary | ICD-10-CM | POA: Diagnosis not present

## 2023-05-18 DIAGNOSIS — I252 Old myocardial infarction: Secondary | ICD-10-CM | POA: Diagnosis not present

## 2023-05-18 DIAGNOSIS — E1165 Type 2 diabetes mellitus with hyperglycemia: Secondary | ICD-10-CM | POA: Diagnosis not present

## 2023-05-18 DIAGNOSIS — Z79899 Other long term (current) drug therapy: Secondary | ICD-10-CM

## 2023-05-18 DIAGNOSIS — K760 Fatty (change of) liver, not elsewhere classified: Secondary | ICD-10-CM | POA: Insufficient documentation

## 2023-05-18 DIAGNOSIS — E872 Acidosis, unspecified: Secondary | ICD-10-CM | POA: Diagnosis not present

## 2023-05-18 DIAGNOSIS — D72829 Elevated white blood cell count, unspecified: Secondary | ICD-10-CM | POA: Diagnosis not present

## 2023-05-18 DIAGNOSIS — E119 Type 2 diabetes mellitus without complications: Secondary | ICD-10-CM

## 2023-05-18 DIAGNOSIS — I959 Hypotension, unspecified: Secondary | ICD-10-CM | POA: Diagnosis present

## 2023-05-18 DIAGNOSIS — Z992 Dependence on renal dialysis: Secondary | ICD-10-CM | POA: Diagnosis not present

## 2023-05-18 DIAGNOSIS — I482 Chronic atrial fibrillation, unspecified: Secondary | ICD-10-CM | POA: Diagnosis present

## 2023-05-18 DIAGNOSIS — Z9861 Coronary angioplasty status: Secondary | ICD-10-CM

## 2023-05-18 DIAGNOSIS — Z7982 Long term (current) use of aspirin: Secondary | ICD-10-CM

## 2023-05-18 DIAGNOSIS — J9601 Acute respiratory failure with hypoxia: Secondary | ICD-10-CM | POA: Diagnosis present

## 2023-05-18 DIAGNOSIS — I509 Heart failure, unspecified: Secondary | ICD-10-CM

## 2023-05-18 DIAGNOSIS — N2581 Secondary hyperparathyroidism of renal origin: Secondary | ICD-10-CM | POA: Diagnosis not present

## 2023-05-18 DIAGNOSIS — E1122 Type 2 diabetes mellitus with diabetic chronic kidney disease: Secondary | ICD-10-CM | POA: Diagnosis not present

## 2023-05-18 DIAGNOSIS — I132 Hypertensive heart and chronic kidney disease with heart failure and with stage 5 chronic kidney disease, or end stage renal disease: Secondary | ICD-10-CM | POA: Diagnosis not present

## 2023-05-18 DIAGNOSIS — J90 Pleural effusion, not elsewhere classified: Secondary | ICD-10-CM | POA: Diagnosis not present

## 2023-05-18 DIAGNOSIS — Z8701 Personal history of pneumonia (recurrent): Secondary | ICD-10-CM

## 2023-05-18 DIAGNOSIS — I2510 Atherosclerotic heart disease of native coronary artery without angina pectoris: Secondary | ICD-10-CM | POA: Diagnosis present

## 2023-05-18 DIAGNOSIS — I953 Hypotension of hemodialysis: Secondary | ICD-10-CM | POA: Diagnosis not present

## 2023-05-18 DIAGNOSIS — Z880 Allergy status to penicillin: Secondary | ICD-10-CM

## 2023-05-18 DIAGNOSIS — N250 Renal osteodystrophy: Secondary | ICD-10-CM | POA: Diagnosis present

## 2023-05-18 DIAGNOSIS — I4891 Unspecified atrial fibrillation: Secondary | ICD-10-CM | POA: Diagnosis not present

## 2023-05-18 DIAGNOSIS — I361 Nonrheumatic tricuspid (valve) insufficiency: Secondary | ICD-10-CM | POA: Diagnosis not present

## 2023-05-18 DIAGNOSIS — E871 Hypo-osmolality and hyponatremia: Secondary | ICD-10-CM | POA: Diagnosis not present

## 2023-05-18 DIAGNOSIS — Z6831 Body mass index (BMI) 31.0-31.9, adult: Secondary | ICD-10-CM

## 2023-05-18 DIAGNOSIS — I5043 Acute on chronic combined systolic (congestive) and diastolic (congestive) heart failure: Secondary | ICD-10-CM | POA: Diagnosis not present

## 2023-05-18 DIAGNOSIS — E7800 Pure hypercholesterolemia, unspecified: Secondary | ICD-10-CM | POA: Diagnosis not present

## 2023-05-18 DIAGNOSIS — J9811 Atelectasis: Secondary | ICD-10-CM | POA: Diagnosis not present

## 2023-05-18 DIAGNOSIS — R188 Other ascites: Secondary | ICD-10-CM | POA: Diagnosis not present

## 2023-05-18 DIAGNOSIS — R0989 Other specified symptoms and signs involving the circulatory and respiratory systems: Secondary | ICD-10-CM | POA: Diagnosis not present

## 2023-05-18 DIAGNOSIS — E8770 Fluid overload, unspecified: Secondary | ICD-10-CM | POA: Diagnosis present

## 2023-05-18 DIAGNOSIS — Z7952 Long term (current) use of systemic steroids: Secondary | ICD-10-CM

## 2023-05-18 DIAGNOSIS — Z87891 Personal history of nicotine dependence: Secondary | ICD-10-CM

## 2023-05-18 DIAGNOSIS — Z888 Allergy status to other drugs, medicaments and biological substances status: Secondary | ICD-10-CM

## 2023-05-18 LAB — COMPREHENSIVE METABOLIC PANEL
ALT: 93 U/L — ABNORMAL HIGH (ref 0–44)
AST: 57 U/L — ABNORMAL HIGH (ref 15–41)
Albumin: 3 g/dL — ABNORMAL LOW (ref 3.5–5.0)
Alkaline Phosphatase: 197 U/L — ABNORMAL HIGH (ref 38–126)
Anion gap: 15 (ref 5–15)
BUN: 27 mg/dL — ABNORMAL HIGH (ref 8–23)
CO2: 26 mmol/L (ref 22–32)
Calcium: 9 mg/dL (ref 8.9–10.3)
Chloride: 93 mmol/L — ABNORMAL LOW (ref 98–111)
Creatinine, Ser: 2.38 mg/dL — ABNORMAL HIGH (ref 0.61–1.24)
GFR, Estimated: 26 mL/min — ABNORMAL LOW (ref >60)
Glucose, Bld: 198 mg/dL — ABNORMAL HIGH (ref 70–99)
Potassium: 4.5 mmol/L (ref 3.5–5.1)
Sodium: 134 mmol/L — ABNORMAL LOW (ref 135–145)
Total Bilirubin: 2 mg/dL — ABNORMAL HIGH (ref <1.2)
Total Protein: 6.2 g/dL — ABNORMAL LOW (ref 6.5–8.1)

## 2023-05-18 LAB — CBC
HCT: 39.7 % (ref 39.0–52.0)
Hemoglobin: 13.2 g/dL (ref 13.0–17.0)
MCH: 33.6 pg (ref 26.0–34.0)
MCHC: 33.2 g/dL (ref 30.0–36.0)
MCV: 101 fL — ABNORMAL HIGH (ref 80.0–100.0)
Platelets: 123 10*3/uL — ABNORMAL LOW (ref 150–400)
RBC: 3.93 MIL/uL — ABNORMAL LOW (ref 4.22–5.81)
RDW: 16.7 % — ABNORMAL HIGH (ref 11.5–15.5)
WBC: 10.5 10*3/uL (ref 4.0–10.5)
nRBC: 0.3 % — ABNORMAL HIGH (ref 0.0–0.2)

## 2023-05-18 LAB — CBG MONITORING, ED
Glucose-Capillary: 185 mg/dL — ABNORMAL HIGH (ref 70–99)
Glucose-Capillary: 180 mg/dL — ABNORMAL HIGH (ref 70–99)
Glucose-Capillary: 307 mg/dL — ABNORMAL HIGH (ref 70–99)

## 2023-05-18 MED ORDER — HEPARIN (PORCINE) 25000 UT/250ML-% IV SOLN: 1000.0000 [IU]/h | INTRAVENOUS | Status: DC

## 2023-05-18 MED ORDER — HEPARIN BOLUS VIA INFUSION
3500.0000 [IU] | Freq: Once | INTRAVENOUS | Status: DC
  Filled 2023-05-18: qty 3500

## 2023-05-18 MED ORDER — IOHEXOL 350 MG/ML SOLN
75.0000 mL | Freq: Once | INTRAVENOUS | Status: AC | PRN
  Administered 2023-05-18: 75 mL via INTRAVENOUS

## 2023-05-18 MED ORDER — DILTIAZEM LOAD VIA INFUSION
15.0000 mg | Freq: Once | INTRAVENOUS | Status: AC
  Administered 2023-05-18: 15 mg via INTRAVENOUS
  Filled 2023-05-18: qty 15

## 2023-05-18 MED ORDER — DILTIAZEM HCL-DEXTROSE 125-5 MG/125ML-% IV SOLN (PREMIX)
5.0000 mg/h | INTRAVENOUS | Status: DC
  Administered 2023-05-18: 5 mg/h via INTRAVENOUS
  Filled 2023-05-18: qty 125

## 2023-05-18 MED ORDER — DILTIAZEM HCL 25 MG/5ML IV SOLN
15.0000 mg | Freq: Once | INTRAVENOUS | Status: AC
  Administered 2023-05-18: 15 mg via INTRAVENOUS
  Filled 2023-05-18: qty 5

## 2023-05-18 MED ORDER — MIDODRINE HCL 5 MG PO TABS
5.0000 mg | ORAL_TABLET | Freq: Three times a day (TID) | ORAL | Status: DC
  Administered 2023-05-18 – 2023-05-20 (×5): 5 mg via ORAL
  Filled 2023-05-18 (×4): qty 1

## 2023-05-18 MED ORDER — MIDODRINE HCL 5 MG PO TABS: 5.0000 mg | ORAL_TABLET | Freq: Three times a day (TID) | ORAL | Status: DC

## 2023-05-18 MED ORDER — HYDROCORTISONE SOD SUC (PF) 100 MG IJ SOLR
100.0000 mg | Freq: Three times a day (TID) | INTRAMUSCULAR | Status: DC
  Administered 2023-05-18 – 2023-05-20 (×6): 100 mg via INTRAVENOUS
  Filled 2023-05-18 (×8): qty 2

## 2023-05-18 MED ORDER — INSULIN NPH (HUMAN) (ISOPHANE) 100 UNIT/ML ~~LOC~~ SUSP
10.0000 [IU] | Freq: Every day | SUBCUTANEOUS | Status: DC
  Administered 2023-05-19 – 2023-05-30 (×11): 10 [IU] via SUBCUTANEOUS
  Filled 2023-05-18 (×3): qty 10

## 2023-05-18 MED ORDER — INSULIN ASPART 100 UNIT/ML IJ SOLN
0.0000 [IU] | INTRAMUSCULAR | Status: DC
  Administered 2023-05-18: 4 [IU] via SUBCUTANEOUS
  Administered 2023-05-19: 3 [IU] via SUBCUTANEOUS
  Administered 2023-05-19: 2 [IU] via SUBCUTANEOUS
  Administered 2023-05-19: 3 [IU] via SUBCUTANEOUS
  Administered 2023-05-19: 4 [IU] via SUBCUTANEOUS
  Administered 2023-05-19 (×2): 2 [IU] via SUBCUTANEOUS
  Administered 2023-05-20: 1 [IU] via SUBCUTANEOUS
  Administered 2023-05-20: 3 [IU] via SUBCUTANEOUS
  Administered 2023-05-20: 4 [IU] via SUBCUTANEOUS
  Administered 2023-05-20 – 2023-05-21 (×2): 5 [IU] via SUBCUTANEOUS
  Administered 2023-05-21: 1 [IU] via SUBCUTANEOUS
  Administered 2023-05-21: 2 [IU] via SUBCUTANEOUS
  Administered 2023-05-21: 3 [IU] via SUBCUTANEOUS
  Administered 2023-05-22 (×2): 2 [IU] via SUBCUTANEOUS
  Administered 2023-05-22: 1 [IU] via SUBCUTANEOUS
  Administered 2023-05-22: 5 [IU] via SUBCUTANEOUS
  Administered 2023-05-22: 4 [IU] via SUBCUTANEOUS
  Administered 2023-05-23 (×2): 2 [IU] via SUBCUTANEOUS
  Administered 2023-05-23: 1 [IU] via SUBCUTANEOUS
  Administered 2023-05-24: 3 [IU] via SUBCUTANEOUS
  Administered 2023-05-24: 1 [IU] via SUBCUTANEOUS
  Administered 2023-05-24: 3 [IU] via SUBCUTANEOUS
  Administered 2023-05-25: 2 [IU] via SUBCUTANEOUS
  Administered 2023-05-25: 1 [IU] via SUBCUTANEOUS
  Administered 2023-05-25: 3 [IU] via SUBCUTANEOUS
  Administered 2023-05-25 (×2): 1 [IU] via SUBCUTANEOUS
  Administered 2023-05-25 – 2023-05-26 (×2): 2 [IU] via SUBCUTANEOUS
  Administered 2023-05-26: 1 [IU] via SUBCUTANEOUS

## 2023-05-18 NOTE — Assessment & Plan Note (Signed)
 Currently appears euvolemic.

## 2023-05-18 NOTE — ED Provider Notes (Signed)
[place] EMERGENCY DEPARTMENT AT [facility] Provider Note   CSN: [id number] Arrival date & time: 05/18/23  1551     History {Add pertinent medical, surgical, social history, OB history to HPI:1} Chief Complaint  Patient presents with  . Hypotension    Robert Lucas is a 82 y.o. male.  HPI Patient has a history of hypertension hyperlipidemia, coronary artery disease, pneumonia, heart failure, chronic kidney disease  Pt states he came to the ED for low bp and high heart rate.  Pt states he was started on  a new medication this week, midodrine.  No chest pain, no shortness of breath.  Pt states he noticed his heart rate has been high the past week.  He has not seen anyone for it.  No fevers or chills.  No vomiting or diarrhea.    No blood in the stool. Low BP was noticed at dialysis today.  He was sent to the ED.  Home Medications Prior to Admission medications   Medication Sig Start Date End Date Taking? Authorizing Provider  acetaminophen (TYLENOL) 650 MG CR tablet Take 1,300 mg by mouth in the morning, at noon, and at bedtime.    [provider]  allopurinol (ZYLOPRIM) 100 MG tablet Take 100 mg by mouth in the morning.    [provider]  aspirin EC 81 MG tablet Take 81 mg by mouth daily with supper.    [provider]  atorvastatin (LIPITOR) 40 MG tablet Take 40 mg by mouth at bedtime.    [provider]  CALCITRIOL PO Take 2 capsules by mouth every Monday, Wednesday, and Friday with hemodialysis.    [provider]  cyclobenzaprine (FLEXERIL) 10 MG tablet Take 1 tablet (10 mg total) by mouth 2 (two) times daily as needed for muscle spasms. 03/25/23   Alvira Monday, MD  finasteride (PROSCAR) 5 MG tablet Take 5 mg by mouth daily with supper.    [provider]  gabapentin (NEURONTIN) 300 MG capsule Take 1 capsule (300 mg total) by mouth at bedtime. 06/09/22   Lars Mage, PA-C  insulin aspart protamine - aspart  (NOVOLOG MIX 70/30 FLEXPEN) (70-30) 100 UNIT/ML FlexPen Inject 14-20 Units into the skin daily as needed (high blood sugar). If BS is <150=0 units, If BS is 150-200 units=14 units, If BS is >201=20 units    [provider]  IRON SUCROSE IV Inject into the vein as needed. At dialysis    [provider]  lidocaine (LIDODERM) 5 % Place 1 patch onto the skin daily. Remove & Discard patch within 12 hours or as directed by MD 03/25/23   Alvira Monday, MD  metoprolol succinate (TOPROL-XL) 50 MG 24 hr tablet Take 1 tablet (50 mg total) by mouth daily. Take with or immediately following a meal. 06/20/22   Patwardhan, Manish J, MD  tamsulosin (FLOMAX) 0.4 MG CAPS capsule Take 1 capsule (0.4 mg total) by mouth daily after supper. Patient taking differently: Take 0.4 mg by mouth at bedtime. 07/11/20   Albertine Grates, MD  traMADol (ULTRAM) 50 MG tablet Take 1 tablet (50 mg total) by mouth every 6 (six) hours as needed. Patient not taking: Reported on 06/20/2022 05/02/22   Emilie Rutter, PA-C      Allergies    Zestril [lisinopril], Hydrocodone, Januvia [sitagliptin], Levitra [vardenafil], Prednisone, Victoza [liraglutide], and Penicillins    Review of Systems   Review of Systems  Physical Exam Updated Vital Signs BP (!) 115/98 (BP Location: Right  Arm)   Pulse (!) 119   Temp 98.7 F (37.1 C) (Oral)   Resp 19   SpO2 90%  Physical Exam Vitals and nursing note reviewed.  Constitutional:      General: He is not in acute distress.    Appearance: He is well-developed.  HENT:     Head: Normocephalic and atraumatic.     Right Ear: External ear normal.     Left Ear: External ear normal.  Eyes:     General: No scleral icterus.       Right eye: No discharge.        Left eye: No discharge.     Conjunctiva/sclera: Conjunctivae normal.  Neck:     Trachea: No tracheal deviation.  Cardiovascular:     Rate and Rhythm: Tachycardia present. Rhythm irregular.  Pulmonary:     Effort: Pulmonary  effort is normal. No respiratory distress.     Breath sounds: Normal breath sounds. No stridor. No wheezing or rales.  Abdominal:     General: Bowel sounds are normal. There is no distension.     Palpations: Abdomen is soft.     Tenderness: There is no abdominal tenderness. There is no guarding or rebound.  Musculoskeletal:        General: No tenderness or deformity.     Cervical back: Neck supple.     Right lower leg: Edema present.     Left lower leg: Edema present.  Skin:    General: Skin is warm and dry.     Findings: No rash.  Neurological:     General: No focal deficit present.     Mental Status: He is alert.     Cranial Nerves: No cranial nerve deficit, dysarthria or facial asymmetry.     Sensory: No sensory deficit.     Motor: No abnormal muscle tone or seizure activity.     Coordination: Coordination normal.  Psychiatric:        Mood and Affect: Mood normal.    ED Results / Procedures / Treatments   Labs (all labs ordered are listed, but only abnormal results are displayed) Labs Reviewed  CBC - Abnormal; Notable for the following components:      Result Value   RBC 3.93 (*)    MCV 101.0 (*)    RDW 16.7 (*)    Platelets 123 (*)    nRBC 0.3 (*)    All other components within normal limits  COMPREHENSIVE METABOLIC PANEL - Abnormal; Notable for the following components:   Sodium 134 (*)    Chloride 93 (*)    Glucose, Bld 198 (*)    BUN 27 (*)    Creatinine, Ser 2.38 (*)    Total Protein 6.2 (*)    Albumin 3.0 (*)    AST 57 (*)    ALT 93 (*)    Alkaline Phosphatase 197 (*)    Total Bilirubin 2.0 (*)    GFR, Estimated 26 (*)    All other components within normal limits  CBG MONITORING, ED - Abnormal; Notable for the following components:   Glucose-Capillary 185 (*)    All other components within normal limits  CBG MONITORING, ED - Abnormal; Notable for the following components:   Glucose-Capillary 180 (*)    All other components within normal limits   URINALYSIS, ROUTINE W REFLEX MICROSCOPIC    EKG EKG Interpretation Date/Time:  Friday May 18 2023 16:51:51 EST Ventricular Rate:  126 PR Interval:    QRS Duration:  90 QT Interval:  294 QTC Calculation: 425 R Axis:   123  Text Interpretation: Atrial fibrillation with rapid ventricular response Right axis deviation Anteroseptal infarct , age undetermined ST & T wave abnormality, consider inferolateral ischemia Abnormal ECG When compared with ECG of 17-Dec-2022 19:30, st &T changes rew new, atrial fibrillation is new Confirmed by Linwood Dibbles [phone]) on 05/18/2023 5:27:34 PM  Radiology No results found.  Procedures .Critical Care  Performed by: Linwood Dibbles, MD Authorized by: Linwood Dibbles, MD   Critical care provider statement:    Critical care time (minutes):  35   Critical care was time spent personally by me on the following activities:  Development of treatment plan with patient or surrogate, discussions with consultants, evaluation of patient's response to treatment, examination of patient, ordering and review of laboratory studies, ordering and review of radiographic studies, ordering and performing treatments and interventions, pulse oximetry, re-evaluation of patient's condition and review of old charts   {Document cardiac monitor, telemetry assessment procedure when appropriate:1}  Medications Ordered in ED Medications  midodrine (PROAMATINE) tablet 5 mg (has no administration in time range)  diltiazem (CARDIZEM) 1 mg/mL load via infusion 15 mg (has no administration in time range)    And  diltiazem (CARDIZEM) 125 mg in dextrose 5% 125 mL (1 mg/mL) infusion (has no administration in time range)  diltiazem (CARDIZEM) injection 15 mg (15 mg Intravenous Given 05/18/23 1817)    ED Course/ Medical Decision Making/ A&P Clinical Course as of 05/18/23 2058  Fri May 18, 2023  2050 CBC(!) Hemoglobin increased compared to previous [JK]  2051 Comprehensive metabolic  panel(!) Renal function abnormal but similar to previous values [JK]  2055 Rate still in the 110s.  Blood pressure has improved. [JK]    Clinical Course User Index [JK] Linwood Dibbles, MD   {   Click here for ABCD2, HEART and other calculatorsREFRESH Note before signing :1}      CHA2DS2-VASc Score: 8                        Medical Decision Making Amount and/or Complexity of Data Reviewed Labs: ordered. Decision-making details documented in ED Course.  Risk Prescription drug management.   Patient presented to the ED for evaluation of tachycardia.  Patient noted to be in new onset atrial fibrillation.  Patient was at dialysis when this occurred.  Initially hypotensive but that has resolved in the ED.  He was treated with IV Cardizem heart rate has decreased but he still remains in A-fib and tachycardic.  No signs of acute infection.  No significant metabolic abnormalities.  Patient started on IV Cardizem infusion.  Will consult the medical service for admission and further treatment  {Document critical care time when appropriate:1} {Document review of labs and clinical decision tools ie heart score, Chads2Vasc2 etc:1}  {Document your independent review of radiology images, and any outside records:1} {Document your discussion with family members, caretakers, and with consultants:1} {Document social determinants of health affecting pt's care:1} {Document your decision making why or why not admission, treatments were needed:1} Final Clinical Impression(s) / ED Diagnoses Final diagnoses:  Atrial fibrillation, unspecified type (HCC)  Chronic kidney disease, unspecified CKD stage    Rx / DC Orders ED Discharge Orders     None

## 2023-05-18 NOTE — Assessment & Plan Note (Signed)
Chornic stable restrt aspirin when able  Cont lipitor 40g po q day

## 2023-05-18 NOTE — ED Notes (Signed)
The RN, Lead Nurse, and Charge Nurse have been made aware there are no cardiac orders for the patient.  Currently waiting for either of the aforementioned staff to call to confirm cardiac orders.

## 2023-05-18 NOTE — Consult Note (Incomplete)
Cardiology Consultation:   Patient ID: Robert Lucas MRN: [id number]; DOB: 21-Nov-1940  Admit date: 05/18/2023 Date of Consult: 05/18/2023  Primary Care Provider: Joycelyn Rua, MD Primary Cardiologist: Elder Negus, MD  Primary Electrophysiologist:  None    Patient Profile:   Robert Lucas is a 82 y.o. male with a hx of ESRD on HD, T2DM, HFrEF (30-35%), CAD, HTN, HLD, anemia, gout who is being seen today for the evaluation of afib at the request of hospital medicine (Dr. Adela Glimpse).  History of Present Illness:   Robert Lucas is a 82 y.o. male who has ESRD on HD, CAD, HFrEF, HTN, HLD, anemia who was sent to the emergency department from his dialysis center due to hypotension and tachycardia.  The patient tells me that he has been largely asymptomatic.  He notes that there has been issues with low blood pressures throughout dialysis and they vary how much volume is removed.  Over the last 2 weeks they have also noted that he has had tachycardia and irregular heartbeats.  They had even recently started him on midodrine for his low blood pressures.  However this is the most significantly low he has had a dialysis after just about 2-1/2 L removal.  Heart rate was in the 140s and in A-fib.  On route he was initially treated with diltiazem and had marked hypertension.   In the emergency department, he had systolic blood pressures in the 90s to 100s with heart rates ranged from 120s to 140s initially.  However when I saw him his heart rates are 100s to 110s.  Initial workup had normal white count.  Hemoglobin 13.2.  Initial troponin was 559.  Lactate of 2.8.  He remains without symptoms.     Past Medical History:  Diagnosis Date   Arthritis    CHF (congestive heart failure) (HCC)    HFrEF   CKD (chronic kidney disease)    sees [place] in [place]   Coronary artery disease    Diabetes (HCC)    type 2   sees endo at [place] in [place]   ESRD (end stage renal disease) on dialysis  [facility])    M,W,F   Hypercholesteremia    Hypertension    Myocardial infarction (HCC) 1990   Neuromuscular disorder (HCC)    Pneumonia    PONV (postoperative nausea and vomiting)      Past Surgical History:  Procedure Laterality Date   AV FISTULA PLACEMENT Left 05/17/2021   Procedure: LEFT ARM ARTERIOVENOUS (AV) FISTULA CREATION;  Surgeon: Victorino Sparrow, MD;  Location: [facility] OR;  Service: Vascular;  Laterality: Left;  PERIPHERAL NERVE BLOCK   AV FISTULA PLACEMENT Left 05/02/2022   Procedure: INSERTION OF LEFT ARTERIOVENOUS (AV) GORE-TEX GRAFT;  Surgeon: Victorino Sparrow, MD;  Location: [facility] OR;  Service: Vascular;  Laterality: Left;   BACK SURGERY  yrs ago   lower   CARDIAC CATHETERIZATION     CATARACT EXTRACTION Right 01/2021   CHOLECYSTECTOMY N/A 05/02/2017   Procedure: LAPAROSCOPIC CHOLECYSTECTOMY;  Surgeon: Axel Filler, MD;  Location: [facility] OR;  Service: General;  Laterality: N/A;   CORONARY ANGIOPLASTY     2 1990   EYE SURGERY Left 03/2021   cataract removal   IR FLUORO GUIDE CV LINE RIGHT  10/15/2021   IR US GUIDE VASC ACCESS RIGHT  10/15/2021   KNEE SURGERY Left yrs ago   arthroscopic   LIGATION OF COMPETING BRANCHES OF ARTERIOVENOUS FISTULA Left 11/22/2021   Procedure: LEFT ARM FISTULA BRANCH LIGATION;  Surgeon: Victorino Sparrow, MD;  Location: [facility] OR;  Service: Vascular;  Laterality: Left;   PERIPHERAL VASCULAR BALLOON ANGIOPLASTY  10/19/2021   Procedure: PERIPHERAL VASCULAR BALLOON ANGIOPLASTY;  Surgeon: Victorino Sparrow, MD;  Location: [facility] INVASIVE CV LAB;  Service: Cardiovascular;;  Left AVF   ROTATOR CUFF REPAIR Left yrs ago   SHOULDER OPEN ROTATOR CUFF REPAIR Right 04/23/2013   Procedure: RIGHT SHOULDER ROTATOR CUFF REPAIR WITH GRAFT AND ANCHORS ;  Surgeon: Jacki Cones, MD;  Location: [facility] ORS;  Service: Orthopedics;  Laterality: Right;   TONSILLECTOMY     TOTAL HIP ARTHROPLASTY Right 05/06/2019   Procedure: TOTAL HIP ARTHROPLASTY ANTERIOR APPROACH;  Surgeon: Durene Romans, MD;  Location: [facility] ORS;  Service: Orthopedics;  Laterality: Right;  70 mins     Home Medications:  Prior to Admission medications   Medication Sig Start Date End Date Taking? Authorizing Provider  acetaminophen (TYLENOL) 650 MG CR tablet Take 1,300 mg by mouth in the morning, at noon, and at bedtime.   Yes [provider]  allopurinol (ZYLOPRIM) 100 MG tablet Take 100 mg by mouth in the morning.   Yes [provider]  aspirin EC 81 MG tablet Take 81 mg by mouth at bedtime.   Yes [provider]  atorvastatin (LIPITOR) 40 MG tablet Take 40 mg by mouth at bedtime.   Yes [provider]  finasteride (PROSCAR) 5 MG tablet Take 5 mg by mouth daily with supper.   Yes [provider]  insulin aspart protamine - aspart (NOVOLOG MIX 70/30 FLEXPEN) (70-30) 100 UNIT/ML FlexPen Inject 14-20 Units into the skin daily as needed (high blood sugar). If BS is <150=0 units, If BS is 150-200 units=14 units, If BS is >201=20 units   Yes [provider]  metoprolol succinate (TOPROL-XL) 50 MG 24 hr tablet Take 1 tablet (50 mg total) by mouth daily. Take with or immediately following a meal. 06/20/22  Yes Patwardhan, Manish J, MD  predniSONE (DELTASONE) 10 MG tablet Take 1 tablet by mouth 3 (three) times daily.   Yes [provider]  tamsulosin (FLOMAX) 0.4 MG CAPS capsule Take 1 capsule (0.4 mg total) by mouth daily after supper. Patient taking differently: Take 0.4 mg by mouth at bedtime. 07/11/20  Yes Albertine Grates, MD  gabapentin (NEURONTIN) 300 MG capsule Take 1 capsule (300 mg total) by mouth at bedtime. Patient not taking: Reported on 05/19/2023 06/09/22   Lars Mage, PA-C    Inpatient Medications: Scheduled Meds:  heparin  3,500 Units Intravenous Once   hydrocortisone sod succinate (SOLU-CORTEF) inj  100 mg Intravenous Q8H   insulin aspart  0-6 Units Subcutaneous Q4H   midodrine  5 mg Oral TID WC   Continuous Infusions:  PRN  Meds:   Allergies:    Allergies  Allergen Reactions   Zestril [Lisinopril] Cough   Hydrocodone Nausea Only   Januvia [Sitagliptin] Other (See Comments)    Unknown   Levitra [Vardenafil] Other (See Comments)    flushing   Prednisone Other (See Comments)    Elevates BGL; patient prefers to not take this   Victoza [Liraglutide] Other (See Comments)    GI upset   Penicillins Rash and Other (See Comments)    Rash around ankles Has patient had a PCN reaction causing immediate rash, facial/tongue/throat swelling, SOB or lightheadedness with hypotension: Yes Has patient had a PCN reaction causing severe rash involving mucus membranes or skin necrosis: Unk Has patient had a PCN reaction that  required hospitalization: No Has patient had a PCN reaction occurring within the last 10 years: No If all of the above answers are "NO", then may proceed with Cephalosporin use.     Social History:   Social History   Socioeconomic History   Marital status: Married    Spouse name: Not on file   Number of children: 3   Years of education: Not on file   Highest education level: Not on file  Occupational History   Not on file  Tobacco Use   Smoking status: Former    Types: Cigars    Quit date: 07/03/1988    Years since quitting: 34.8    Passive exposure: Never   Smokeless tobacco: Never  Vaping Use   Vaping status: Never Used  Substance and Sexual Activity   Alcohol use: Not Currently    Comment: occasional beer   Drug use: No   Sexual activity: Not Currently  Other Topics Concern   Not on file  Social History Narrative   Not on file   Social Determinants of Health   Financial Resource Strain: Not on file  Food Insecurity: No Food Insecurity (01/30/2022)   Hunger Vital Sign    Worried About Running Out of Food in the Last Year: Never true    Ran Out of Food in the Last Year: Never true  Transportation Needs: No Transportation Needs (01/30/2022)   PRAPARE - Scientist, research (physical sciences) (Medical): No    Lack of Transportation (Non-Medical): No  Physical Activity: Not on file  Stress: Not on file  Social Connections: Not on file  Intimate Partner Violence: Not on file    Family History:    Family History  Problem Relation Age of Onset   Dementia Mother    Stroke Brother      Review of Systems: All other review of systems are negative unless otherwise noted in the HPI as above.   Physical Exam/Data:   Vitals:   05/18/23 2045 05/18/23 2115 05/18/23 2130 05/18/23 2230  BP: (!) 115/98 110/72 98/66 101/63  Pulse: (!) 119  (!) 121 (!) 102  Resp: 19 (!) 22 15 20   Temp: 98.7 F (37.1 C)     TempSrc: Oral     SpO2: 90%  93% 100%   No intake or output data in the 24 hours ending 05/18/23 2318 There were no vitals filed for this visit. There is no height or weight on file to calculate BMI.  General appearance: alert and cooperative Neck: no adenopathy, no carotid bruit, no JVD, and supple, symmetrical, trachea midline Lungs: clear to auscultation bilaterally Heart: irregularly irregular rhythm Abdomen: soft, non-tender; bowel sounds normal; no masses,  no organomegaly Pulses: 2+ and symmetric Skin: Skin color, texture, turgor normal. No rashes or lesions Neurologic: Grossly normal  EKG:  The EKG was personally reviewed and demonstrates: Atrial fibrillation Telemetry:  Telemetry was personally reviewed and demonstrates: Atrial fibrillation  Relevant CV Studies: Echo 08/22/2021:  1. Left ventricular ejection fraction, by estimation, is 30 to 35%. The  left ventricle has moderately decreased function. The left ventricle has  no regional wall motion abnormalities. The left ventricular internal  cavity size was mildly dilated. Left  ventricular diastolic parameters are consistent with Grade II diastolic  dysfunction (pseudonormalization).   2. Right ventricular systolic function is normal. The right ventricular  size is normal. There is severely  elevated pulmonary artery systolic  pressure. The estimated right ventricular systolic pressure is 77.7  mmHg.   3. Left atrial size was moderately dilated.   4. The mitral valve is normal in structure. No evidence of mitral valve  regurgitation. No evidence of mitral stenosis.   5. Tricuspid valve regurgitation is mild to moderate.   6. The aortic valve is tricuspid. There is mild calcification of the  aortic valve. There is mild thickening of the aortic valve. Aortic valve  regurgitation is not visualized. Aortic valve sclerosis is present, with  no evidence of aortic valve stenosis.   7. The inferior vena cava is dilated in size with <50% respiratory  variability, suggesting right atrial pressure of 15 mmHg.   Laboratory Data:  Chemistry Recent Labs  Lab 05/18/23 1820  NA 134*  K 4.5  CL 93*  CO2 26  GLUCOSE 198*  BUN 27*  CREATININE 2.38*  CALCIUM 9.0  GFRNONAA 26*  ANIONGAP 15    Recent Labs  Lab 05/18/23 1820  PROT 6.2*  ALBUMIN 3.0*  AST 57*  ALT 93*  ALKPHOS 197*  BILITOT 2.0*   Hematology Recent Labs  Lab 05/18/23 1820  WBC 10.5  RBC 3.93*  HGB 13.2  HCT 39.7  MCV 101.0*  MCH 33.6  MCHC 33.2  RDW 16.7*  PLT 123*   Cardiac EnzymesNo results for input(s): "TROPONINI" in the last 168 hours. No results for input(s): "TROPIPOC" in the last 168 hours.  BNPNo results for input(s): "BNP", "PROBNP" in the last 168 hours.  DDimer No results for input(s): "DDIMER" in the last 168 hours.  Radiology/Studies:  US Abdomen Limited RUQ (LIVER/GB)  Result Date: 05/18/2023 CLINICAL DATA:  Elevated liver function tests EXAM: ULTRASOUND ABDOMEN LIMITED RIGHT UPPER QUADRANT COMPARISON:  11/15/2017 FINDINGS: Gallbladder: Prior cholecystectomy. Common bile duct: Diameter: 5 mm Liver: Diffuse increased liver echotexture consistent with hepatic steatosis. No focal liver abnormality. Portal vein is patent on color Doppler imaging with normal direction of blood flow towards  the liver. Other: Incidental right pleural effusion. Trace right upper quadrant ascites. IMPRESSION: 1. Increased liver echotexture consistent with hepatic steatosis. 2. Prior cholecystectomy. 3. Trace right upper quadrant ascites. 4. Incidental right pleural effusion. Electronically Signed   By: Sharlet Salina M.D.   On: 05/18/2023 23:11   DG Chest Portable 1 View  Result Date: 05/18/2023 CLINICAL DATA:  Hypotension, tachycardia, atrial fibrillation EXAM: PORTABLE CHEST 1 VIEW COMPARISON:  03/25/2023 FINDINGS: Two frontal views of the chest demonstrate an enlarged cardiac silhouette. Chronic central vascular prominence, without acute airspace disease, effusion, or pneumothorax. Linear consolidation at the left lung base likely reflects subsegmental atelectasis. No acute bony abnormalities. IMPRESSION: 1. Enlarged cardiac silhouette, with chronic central vascular congestion. No overt edema. Electronically Signed   By: Sharlet Salina M.D.   On: 05/18/2023 22:22    Assessment and Plan:   Atrial fibrillation with RVR Heart failure with reduced ejection fraction of 30 to 35%, chronic systolic heart failure I think that his hypotension is multifactorial but likely driven by new onset atrial fibrillation superimposed on chronic systolic heart failure.  Likely his tachycardia and hypotension has limited volume removal though granted he does not seem markedly volume overloaded today on exam.  I think in his best interest there should be attempt to restore sinus rhythm and maintain sinus rhythm with amiodarone.  I would start amiodarone 5 g load and consider TEE/cardioversion this hospitalization.  Also agree with repeat echo make sure there is no marked changes.  His CHA2DS2-VASc 2 score is 6 so he needs to be anticoagulated as  well.  Reasonable to start heparin now for anticoagulation but will need to be on a DOAC.  Will need to discuss with the patient which DOAC is most affordable for him.  Agree with  continuation of midodrine and would avoid diltiazem use in systolic heart failure due to its negative inotropic effects.  Would only use amiodarone for now however if he needed rate controllers in the future could consider metoprolol.  Recommendations: -Would start 5 g amiodarone load -Order repeat echo -Would start anticoagulation; can start with heparin for now        For questions or updates, please contact [facility] Please consult [email or web address] for contact info under     Signed, Joellen Jersey, MD  05/18/2023 11:18 PM

## 2023-05-18 NOTE — Assessment & Plan Note (Addendum)
-   Admit to progressive  Did not toelrate dil drip  Now hr in 110  If becomes sig tachycardiac will probably need to start amiodarone  Cardiology has been aware       CHA2D-VASC score 6       Pt would like to hold off on [facility] for now given risk of bleeding   Will need further discussion with cariology       Check TSH      Cycle cardiac enzymes      Obtain ECHO      Cardiology consulted

## 2023-05-18 NOTE — Assessment & Plan Note (Signed)
Hold BP meds  Did not tolerate diltizem  Start on midodrine  And stress dose steroids pt has been on prednisone

## 2023-05-18 NOTE — ED Triage Notes (Signed)
Finished dialysis. Pt became hypotensive at 60/30. 911 was called. HR was at 155 bpm. EMS reports bp was 100/92. Denies pain or any discomfort. Alert and oriented x 4.

## 2023-05-18 NOTE — Assessment & Plan Note (Addendum)
-   Order Sensitive  SSI    -  check TSH and HgA1C NPH 10 units

## 2023-05-18 NOTE — Subjective & Objective (Signed)
At HD was noted to be hypotensive 60/30and tachycardic in A.fib w RVR Was given a doze of Cardizem with improvement No CP no SOB Was hypoxic down to 70's needing 4L  In June was admitted for sepsis due to CAP EF 30-35%

## 2023-05-18 NOTE — ED Notes (Signed)
CARDIZEM DRIP DISCON DUE TO ACUTE DROP IN BP.

## 2023-05-18 NOTE — Assessment & Plan Note (Signed)
this patient has acute respiratory failure with Hypoxia  documented by the presence of following: O2 saturatio< 90% on RA   CTA ordered Provide O2 therapy and titrate as needed  Continuous pulse ox   check Pulse ox with ambulation prior to discharge   may need  TC consult for home O2 set up    flutter valve ordered

## 2023-05-18 NOTE — ED Notes (Signed)
Pt sating in the 70s placed on 4 L of O2 N/C. Will have triage RN assess ASAP.

## 2023-05-18 NOTE — ED Notes (Signed)
ED TO INPATIENT HANDOFF REPORT  ED Nurse Name and Phone #:  Warren Danes DOWN MORE NOTES!!!  S Name/Age/Gender Robert Lucas 82 y.o. male Room/Bed: 005C/005C  Code Status   Code Status: Full Code  Home/SNF/Other Home Patient oriented to: self, place, time, and situation Is this baseline? Yes   Triage Complete: Triage complete  Chief Complaint Atrial fibrillation with RVR (HCC) [I48.91]  Triage Note Finished dialysis. Pt became hypotensive at 60/30. 911 was called. HR was at 155 bpm. EMS reports bp was 100/92. Denies pain or any discomfort. Alert and oriented x 4.    Allergies Allergies  Allergen Reactions   Zestril [Lisinopril] Cough   Hydrocodone Nausea Only   Januvia [Sitagliptin] Other (See Comments)    Unknown   Levitra [Vardenafil] Other (See Comments)    flushing   Prednisone Other (See Comments)    Elevates BGL; patient prefers to not take this   Victoza [Liraglutide] Other (See Comments)    GI upset   Penicillins Rash and Other (See Comments)    Rash around ankles Has patient had a PCN reaction causing immediate rash, facial/tongue/throat swelling, SOB or lightheadedness with hypotension: Yes Has patient had a PCN reaction causing severe rash involving mucus membranes or skin necrosis: Unk Has patient had a PCN reaction that required hospitalization: No Has patient had a PCN reaction occurring within the last 10 years: No If all of the above answers are "NO", then may proceed with Cephalosporin use.     Level of Care/Admitting Diagnosis ED Disposition     ED Disposition  Admit   Condition  --   Comment  Hospital Area: [facility] [[id number]]  Level of Care: Progressive [102]  Admit to Progressive based on following criteria: CARDIOVASCULAR & THORACIC of moderate stability with acute coronary syndrome symptoms/low risk myocardial infarction/hypertensive urgency/arrhythmias/heart failure potentially compromising stability and  stable post cardiovascular intervention patients.  May place patient in observation at [facility] or [facility] if equivalent level of care is available:: No  Covid Evaluation: Asymptomatic - no recent exposure (last 10 days) testing not required  Diagnosis: Atrial fibrillation with RVR [facility]) [[id number]]  Admitting Physician: Therisa Doyne [[id number]]  Attending Physician: Therisa Doyne [[id number]]          B Medical/Surgery History Past Medical History:  Diagnosis Date   Arthritis    CHF (congestive heart failure) (HCC)    HFrEF   CKD (chronic kidney disease)    sees [place] in [place]   Coronary artery disease    Diabetes (HCC)    type 2   sees endo at [place] in [place]   ESRD (end stage renal disease) on dialysis [facility])    M,W,F   Hypercholesteremia    Hypertension    Myocardial infarction (HCC) 1990   Neuromuscular disorder (HCC)    Pneumonia    PONV (postoperative nausea and vomiting)    Past Surgical History:  Procedure Laterality Date   AV FISTULA PLACEMENT Left 05/17/2021   Procedure: LEFT ARM ARTERIOVENOUS (AV) FISTULA CREATION;  Surgeon: Victorino Sparrow, MD;  Location: [facility] OR;  Service: Vascular;  Laterality: Left;  PERIPHERAL NERVE BLOCK   AV FISTULA PLACEMENT Left 05/02/2022   Procedure: INSERTION OF LEFT ARTERIOVENOUS (AV) GORE-TEX GRAFT;  Surgeon: Victorino Sparrow, MD;  Location: [facility] OR;  Service: Vascular;  Laterality: Left;   BACK SURGERY  yrs ago   lower   CARDIAC CATHETERIZATION     CATARACT EXTRACTION Right 01/2021  CHOLECYSTECTOMY N/A 05/02/2017   Procedure: LAPAROSCOPIC CHOLECYSTECTOMY;  Surgeon: Axel Filler, MD;  Location: [facility] OR;  Service: General;  Laterality: N/A;   CORONARY ANGIOPLASTY     2 1990   EYE SURGERY Left 03/2021   cataract removal   IR FLUORO GUIDE CV LINE RIGHT  10/15/2021   IR US GUIDE VASC ACCESS RIGHT  10/15/2021   KNEE SURGERY Left yrs ago   arthroscopic   LIGATION OF COMPETING BRANCHES OF ARTERIOVENOUS FISTULA Left  11/22/2021   Procedure: LEFT ARM FISTULA BRANCH LIGATION;  Surgeon: Victorino Sparrow, MD;  Location: [facility] OR;  Service: Vascular;  Laterality: Left;   PERIPHERAL VASCULAR BALLOON ANGIOPLASTY  10/19/2021   Procedure: PERIPHERAL VASCULAR BALLOON ANGIOPLASTY;  Surgeon: Victorino Sparrow, MD;  Location: [facility] INVASIVE CV LAB;  Service: Cardiovascular;;  Left AVF   ROTATOR CUFF REPAIR Left yrs ago   SHOULDER OPEN ROTATOR CUFF REPAIR Right 04/23/2013   Procedure: RIGHT SHOULDER ROTATOR CUFF REPAIR WITH GRAFT AND ANCHORS ;  Surgeon: Jacki Cones, MD;  Location: [facility] ORS;  Service: Orthopedics;  Laterality: Right;   TONSILLECTOMY     TOTAL HIP ARTHROPLASTY Right 05/06/2019   Procedure: TOTAL HIP ARTHROPLASTY ANTERIOR APPROACH;  Surgeon: Durene Romans, MD;  Location: [facility] ORS;  Service: Orthopedics;  Laterality: Right;  70 mins     A IV Location/Drains/Wounds Patient Lines/Drains/Airways Status     Active Line/Drains/Airways     Name Placement date Placement time Site Days   Peripheral IV 05/18/23 20 G 1.88" Anterior;Right;Upper Arm 05/18/23  1805  Arm  less than 1   Fistula / Graft Left Forearm Arteriovenous fistula 05/17/21  0825  Forearm  731   Fistula / Graft Left Upper arm Arteriovenous vein graft 05/02/22  1001  Upper arm  381            Intake/Output Last 24 hours No intake or output data in the 24 hours ending 05/18/23 2321  Labs/Imaging Results for orders placed or performed during the hospital encounter of 05/18/23 (from the past 48 hour(s))  CBG monitoring, ED     Status: Abnormal   Collection Time: 05/18/23  5:00 PM  Result Value Ref Range   Glucose-Capillary 185 (H) 70 - 99 mg/dL    Comment: Glucose reference range applies only to samples taken after fasting for at least 8 hours.  CBG monitoring, ED     Status: Abnormal   Collection Time: 05/18/23  5:39 PM  Result Value Ref Range   Glucose-Capillary 180 (H) 70 - 99 mg/dL    Comment: Glucose reference range applies only to  samples taken after fasting for at least 8 hours.  CBC     Status: Abnormal   Collection Time: 05/18/23  6:20 PM  Result Value Ref Range   WBC 10.5 4.0 - 10.5 K/uL   RBC 3.93 (L) 4.22 - 5.81 MIL/uL   Hemoglobin 13.2 13.0 - 17.0 g/dL   HCT 52.8 41.3 - 24.4 %   MCV 101.0 (H) 80.0 - 100.0 fL   MCH 33.6 26.0 - 34.0 pg   MCHC 33.2 30.0 - 36.0 g/dL   RDW 01.0 (H) 27.2 - 53.6 %   Platelets 123 (L) 150 - 400 K/uL   nRBC 0.3 (H) 0.0 - 0.2 %    Comment: Performed at [facility], [address], [place], [place] [address]  Comprehensive metabolic panel     Status: Abnormal   Collection Time: 05/18/23  6:20 PM  Result Value  Ref Range   Sodium 134 (L) 135 - 145 mmol/L   Potassium 4.5 3.5 - 5.1 mmol/L   Chloride 93 (L) 98 - 111 mmol/L   CO2 26 22 - 32 mmol/L   Glucose, Bld 198 (H) 70 - 99 mg/dL    Comment: Glucose reference range applies only to samples taken after fasting for at least 8 hours.   BUN 27 (H) 8 - 23 mg/dL   Creatinine, Ser 6.57 (H) 0.61 - 1.24 mg/dL   Calcium 9.0 8.9 - 84.6 mg/dL   Total Protein 6.2 (L) 6.5 - 8.1 g/dL   Albumin 3.0 (L) 3.5 - 5.0 g/dL   AST 57 (H) 15 - 41 U/L   ALT 93 (H) 0 - 44 U/L   Alkaline Phosphatase 197 (H) 38 - 126 U/L   Total Bilirubin 2.0 (H) <1.2 mg/dL   GFR, Estimated 26 (L) >60 mL/min    Comment: (NOTE) Calculated using the CKD-EPI Creatinine Equation (2021)    Anion gap 15 5 - 15    Comment: Performed at [facility], [address], [place], [place] [address]  CBG monitoring, ED     Status: Abnormal   Collection Time: 05/18/23 10:12 PM  Result Value Ref Range   Glucose-Capillary 307 (H) 70 - 99 mg/dL    Comment: Glucose reference range applies only to samples taken after fasting for at least 8 hours.   US Abdomen Limited RUQ (LIVER/GB)  Result Date: 05/18/2023 CLINICAL DATA:  Elevated liver function tests EXAM: ULTRASOUND ABDOMEN LIMITED RIGHT UPPER QUADRANT COMPARISON:  11/15/2017 FINDINGS: Gallbladder: Prior  cholecystectomy. Common bile duct: Diameter: 5 mm Liver: Diffuse increased liver echotexture consistent with hepatic steatosis. No focal liver abnormality. Portal vein is patent on color Doppler imaging with normal direction of blood flow towards the liver. Other: Incidental right pleural effusion. Trace right upper quadrant ascites. IMPRESSION: 1. Increased liver echotexture consistent with hepatic steatosis. 2. Prior cholecystectomy. 3. Trace right upper quadrant ascites. 4. Incidental right pleural effusion. Electronically Signed   By: Sharlet Salina M.D.   On: 05/18/2023 23:11   DG Chest Portable 1 View  Result Date: 05/18/2023 CLINICAL DATA:  Hypotension, tachycardia, atrial fibrillation EXAM: PORTABLE CHEST 1 VIEW COMPARISON:  03/25/2023 FINDINGS: Two frontal views of the chest demonstrate an enlarged cardiac silhouette. Chronic central vascular prominence, without acute airspace disease, effusion, or pneumothorax. Linear consolidation at the left lung base likely reflects subsegmental atelectasis. No acute bony abnormalities. IMPRESSION: 1. Enlarged cardiac silhouette, with chronic central vascular congestion. No overt edema. Electronically Signed   By: Sharlet Salina M.D.   On: 05/18/2023 22:22    Pending Labs Unresulted Labs (From admission, onward)     Start     Ordered   05/20/23 0500  Heparin level (unfractionated)  Daily,   R      05/18/23 2208   05/19/23 0600  Heparin level (unfractionated)  Once-Timed,   TIMED        05/18/23 2208   05/19/23 0500  Vitamin B12  (Anemia Panel (PNL))  Tomorrow morning,   R        05/18/23 2150   05/19/23 0500  Folate  (Anemia Panel (PNL))  Tomorrow morning,   R        05/18/23 2150   05/19/23 0500  Prealbumin  Tomorrow morning,   R        05/18/23 2150   05/18/23 2151  Protime-INR  Once,   R  05/18/23 2150   05/18/23 2151  Iron and TIBC  (Anemia Panel (PNL))  Add-on,   AD        05/18/23 2150   05/18/23 2151  Ferritin  (Anemia Panel (PNL))   Add-on,   AD        05/18/23 2150   05/18/23 2151  Reticulocytes  (Anemia Panel (PNL))  Add-on,   AD        05/18/23 2150   05/18/23 2151  TSH  Add-on,   AD        05/18/23 2150   05/18/23 2151  Magnesium  Add-on,   AD        05/18/23 2150   05/18/23 2151  Phosphorus  Add-on,   AD        05/18/23 2150   05/18/23 1633  Urinalysis, Routine w reflex microscopic -Urine, Clean Catch  Once,   URGENT       Question:  Specimen Source  Answer:  Urine, Clean Catch   05/18/23 1632   Signed and Held  Magnesium  Tomorrow morning,   R        Signed and Held   Signed and Held  Phosphorus  Tomorrow morning,   R        Signed and Held   Signed and Held  Comprehensive metabolic panel  Tomorrow morning,   R       Question:  Release to patient  Answer:  Immediate   Signed and Held   Signed and Held  CBC  Tomorrow morning,   R       Question:  Release to patient  Answer:  Immediate   Signed and Held            Vitals/Pain Today's Vitals   05/18/23 2045 05/18/23 2115 05/18/23 2130 05/18/23 2230  BP: (!) 115/98 110/72 98/66 101/63  Pulse: (!) 119  (!) 121 (!) 102  Resp: 19 (!) 22 15 20   Temp: 98.7 F (37.1 C)     TempSrc: Oral     SpO2: 90%  93% 100%  PainSc:        Isolation Precautions No active isolations  Medications Medications  insulin aspart (novoLOG) injection 0-6 Units (4 Units Subcutaneous Given 05/18/23 2230)  heparin bolus via infusion 3,500 Units (has no administration in time range)  hydrocortisone sodium succinate (SOLU-CORTEF) 100 MG injection 100 mg (100 mg Intravenous Given 05/18/23 2233)  midodrine (PROAMATINE) tablet 5 mg (5 mg Oral Given 05/18/23 2237)  insulin NPH Human (NOVOLIN N) injection 10 Units (has no administration in time range)  diltiazem (CARDIZEM) injection 15 mg (15 mg Intravenous Given 05/18/23 1817)  diltiazem (CARDIZEM) 1 mg/mL load via infusion 15 mg (15 mg Intravenous Bolus from Bag 05/18/23 2151)    Mobility walks     Focused  Assessments Cardiac Assessment Handoff:  Cardiac Rhythm: Atrial fibrillation Lab Results  Component Value Date   TROPONINI <0.30 10/13/2012   Lab Results  Component Value Date   DDIMER 0.70 (H) 10/13/2012   Does the Patient currently have chest pain? No   , Neuro Assessment Handoff:  Swallow screen pass? Yes  Cardiac Rhythm: Atrial fibrillation       Neuro Assessment:   Neuro Checks:      Has TPA been given?  If patient is a Neuro Trauma and patient is going to OR before floor call report to 4N Charge nurse: [phone] or [phone]  , Renal Assessment Handoff:  Hemodialysis Schedule:  Last Hemodialysis  date and time:    Restricted appendage:   , Pulmonary Assessment Handoff:  Lung sounds:          R Recommendations: See Admitting Provider Note  Report given to:   Additional Notes:   Pt is very pleasant and kind. GCS 15. Pt reports he was sleeping during dialysis when his BP dropped and "woke up to EMS telling him to go to the hospital". Pt has no significant complaints and denies palpatation or CP. Pt reports he has been experiencing tachycardia for 1 week, and was informed and monitored during dialysis. Pt has been fluctuating and HR has been maintaining in the 110-130's with meds and slow fluids. Pt was removed form cardizem drip due to drop in SBP. Admitting P came to room and new order were placed. Pt is resting comfortably. Med admin is as noted.

## 2023-05-18 NOTE — Assessment & Plan Note (Signed)
Allow permissive HTN  

## 2023-05-18 NOTE — Assessment & Plan Note (Signed)
On HD MWF Nephrology is aware will see in AM   [facility] to give IV contrast

## 2023-05-18 NOTE — Progress Notes (Signed)
ANTICOAGULATION CONSULT NOTE - Initial Consult  Pharmacy Consult for Heparin Indication: atrial fibrillation  Allergies  Allergen Reactions   Zestril [Lisinopril] Cough   Hydrocodone Nausea Only   Januvia [Sitagliptin] Other (See Comments)    Unknown   Levitra [Vardenafil] Other (See Comments)    flushing   Prednisone Other (See Comments)    Elevates BGL; patient prefers to not take this   Victoza [Liraglutide] Other (See Comments)    GI upset   Penicillins Rash and Other (See Comments)    Rash around ankles Has patient had a PCN reaction causing immediate rash, facial/tongue/throat swelling, SOB or lightheadedness with hypotension: Yes Has patient had a PCN reaction causing severe rash involving mucus membranes or skin necrosis: Unk Has patient had a PCN reaction that required hospitalization: No Has patient had a PCN reaction occurring within the last 10 years: No If all of the above answers are "NO", then may proceed with Cephalosporin use.     Patient Measurements:   Heparin Dosing Weight: 68 kg  Vital Signs: Temp: 98.7 F (37.1 C) (11/15 2045) Temp Source: Oral (11/15 2045) BP: 98/66 (11/15 2130) Pulse Rate: 121 (11/15 2130)  Labs: Recent Labs    05/18/23 1820  HGB 13.2  HCT 39.7  PLT 123*  CREATININE 2.38*    CrCl cannot be calculated (Unknown ideal weight.).   Medical History: Past Medical History:  Diagnosis Date   Arthritis    CHF (congestive heart failure) (HCC)    HFrEF   CKD (chronic kidney disease)    sees [place] in [place]   Coronary artery disease    Diabetes (HCC)    type 2   sees endo at [place] in [place]   ESRD (end stage renal disease) on dialysis [facility])    M,W,F   Hypercholesteremia    Hypertension    Myocardial infarction (HCC) 1990   Neuromuscular disorder (HCC)    Pneumonia    PONV (postoperative nausea and vomiting)     Medications:  (Not in a hospital admission)  Scheduled:   insulin aspart  0-6 Units Subcutaneous  Q4H   [START ON 05/19/2023] midodrine  5 mg Oral TID WC   Infusions:   diltiazem (CARDIZEM) infusion 5 mg/hr (05/18/23 2131)   PRN:   Assessment: 82 yom with a history of HTN, HLD, CAD, pna, HF, ESRD on HD MWF, T2DM. Patient is presenting with AF RVR. Heparin per pharmacy consult placed for atrial fibrillation.  Patient is not on anticoagulation prior to arrival.  Hgb 13.2; plt 123  Goal of Therapy:  Heparin level 0.3-0.7 units/ml Monitor platelets by anticoagulation protocol: Yes   Plan:  Give IV heparin 3500 units bolus x 1 Start heparin infusion at 1000 units/hr Check anti-Xa level in 8 hours and daily while on heparin Continue to monitor H&H and platelets  Delmar Landau, PharmD, BCPS 05/18/2023 10:04 PM ED Clinical Pharmacist -  [phone]

## 2023-05-18 NOTE — H&P (Addendum)
Robert Lucas ZOX:[id number] DOB: [birth year] DOA: 05/18/2023     PCP: Joycelyn Rua, MD   Outpatient Specialists:  CARDS: Dr. Elder Negus, MD  NEphrology:   Dr.  Marisue Humble    Patient arrived to ER on 05/18/23 at 1551 Referred by Attending Linwood Dibbles, MD   Patient coming from:    home Lives With family    Chief Complaint:   Chief Complaint  Patient presents with   Hypotension    HPI: Robert Lucas is a 82 y.o. male with medical history significant of ESRD on HD MWF ,  DM2, systolic/diastolic heart failure ef 30-35%, HLD, anemia, CAD, HTN, hx of MI, gout,     Presented with hypotension, a.fib w rvr At HD was noted to be hypotensive 60/30and tachycardic in A.fib w RVR Was given a doze of Cardizem with improvement No CP no SOB Was hypoxic down to 70's needing 4L  In June was admitted for sepsis due to CAP EF 30-35%    Has had low bp for about a week was strated on Midodrine  Today had 2.5 L relieved Has been on steroids for the past 1 m for back pain   Has significant bruising and bleeding  Reports cough for the past month   Denies significant ETOH intake   Does not smoke   Lab Results  Component Value Date   SARSCOV2NAA NEGATIVE 12/17/2022   SARSCOV2NAA NEGATIVE 10/13/2021   SARSCOV2NAA NEGATIVE 08/25/2021   SARSCOV2NAA NEGATIVE 07/08/2020      Regarding pertinent Chronic problems:    Hyperlipidemia - on statins Lipitor (atorvastatin)  Lipid Panel     Component Value Date/Time   CHOL 108 10/15/2021 1055   TRIG 154 (H) 10/15/2021 1055   HDL 31 (L) 10/15/2021 1055   CHOLHDL 3.5 10/15/2021 1055   VLDL 31 10/15/2021 1055   LDLCALC 46 10/15/2021 1055     HTN on toprol,    chronic CHF diastolic/systolic/ combined - last echo  Recent Results (from the past 81191 hour(s))  ECHOCARDIOGRAM COMPLETE   Collection Time: 08/26/21 10:15 AM  Result Value   Weight 2,560   Height 63   BP 159/67   Single Plane A4C EF 27.5   S' Lateral 3.70   AR  max vel 2.67   AV Peak grad 17.4   Ao pk vel 2.09   Area-P 1/2 5.66   Narrative      ECHOCARDIOGRAM REPORT     IMPRESSIONS    1. Left ventricular ejection fraction, by estimation, is 30 to 35%. The left ventricle has moderately decreased function. The left ventricle has no regional wall motion abnormalities. The left ventricular internal cavity size was mildly dilated. Left  ventricular diastolic parameters are consistent with Grade II diastolic dysfunction (pseudonormalization).  2. Right ventricular systolic function is normal. The right ventricular size is normal. There is severely elevated pulmonary artery systolic pressure. The estimated right ventricular systolic pressure is 77.7 mmHg.  3. Left atrial size was moderately dilated.  4. The mitral valve is normal in structure. No evidence of mitral valve regurgitation. No evidence of mitral stenosis.  5. Tricuspid valve regurgitation is mild to moderate.  6. The aortic valve is tricuspid. There is mild calcification of the aortic valve. There is mild thickening of the aortic valve. Aortic valve regurgitation is not visualized. Aortic valve sclerosis is present, with no evidence of aortic valve stenosis.  7. The inferior vena cava is dilated in size with <50% respiratory variability,  suggesting right atrial pressure of 15 mmHg.           CAD  - On statin, betablocker,                 - followed by cardiology                    DM 2 -  Lab Results  Component Value Date   HGBA1C 8.8 (H) 12/18/2022   on insulin        ESRD - MWF CrCl cannot be calculated (Unknown ideal weight.).  Lab Results  Component Value Date   CREATININE 2.38 (H) 05/18/2023   CREATININE 2.92 (H) 12/20/2022   CREATININE 3.10 (H) 12/19/2022   Lab Results  Component Value Date   NA 134 (L) 05/18/2023   CL 93 (L) 05/18/2023   K 4.5 05/18/2023   CO2 26 05/18/2023   BUN 27 (H) 05/18/2023   CREATININE 2.38 (H) 05/18/2023   GFRNONAA 26 (L) 05/18/2023    CALCIUM 9.0 05/18/2023   PHOS 5.5 (H) 10/19/2021   ALBUMIN 3.0 (L) 05/18/2023   GLUCOSE 198 (H) 05/18/2023    Liver disease MELD 3.0: 17 at 06/30/2022  4:00 PM    Hepatic Function Panel     Component Value Date/Time   PROT 6.2 (L) 05/18/2023 1820   ALBUMIN 3.0 (L) 05/18/2023 1820   AST 57 (H) 05/18/2023 1820   ALT 93 (H) 05/18/2023 1820   ALKPHOS 197 (H) 05/18/2023 1820   BILITOT 2.0 (H) 05/18/2023 1820   BILIDIR <0.1 (L) 11/15/2017 1726   IBILI NOT CALCULATED 11/15/2017 1726      BPH - on Flomax,          Chronic anemia - baseline hg Hemoglobin & Hematocrit  Recent Labs    12/18/22 0727 12/19/22 0043 05/18/23 1820  HGB 11.2* 10.5* 13.2   Iron/TIBC/Ferritin/ %Sat    Component Value Date/Time   IRON 39 (L) 10/15/2021 1056   TIBC 343 10/15/2021 1056   FERRITIN 64 10/15/2021 1056   IRONPCTSAT 11 (L) 10/15/2021 1056     While in ER: Clinical Course as of 05/18/23 2138  Fri May 18, 2023  2050 CBC(!) Hemoglobin increased compared to previous [JK]  2051 Comprehensive metabolic panel(!) Renal function abnormal but similar to previous values [JK]  2055 Rate still in the 110s.  Blood pressure has improved. [JK]  2137 Case discussed with Dr Adela Glimpse [JK]    Clinical Course User Index [JK] Linwood Dibbles, MD       Lab Orders         CBC         Urinalysis, Routine w reflex microscopic -Urine, Clean Catch         Comprehensive metabolic panel         CBG monitoring, ED         CBG monitoring, ED       CXR - Enlarged cardiac silhouette, with chronic central vascular congestion. No overt edema.  RUQ [place]  1. Increased liver echotexture consistent with hepatic steatosis.    CTA - ordered  Following Medications were ordered in ER: Medications  midodrine (PROAMATINE) tablet 5 mg (has no administration in time range)  diltiazem (CARDIZEM) 1 mg/mL load via infusion 15 mg (has no administration in time range)    And  diltiazem (CARDIZEM) 125 mg in dextrose 5% 125 mL (1  mg/mL) infusion (5 mg/hr Intravenous New Bag/Given 05/18/23 2131)  diltiazem (CARDIZEM) injection 15 mg (  15 mg Intravenous Given 05/18/23 1817)       ED Triage Vitals  Encounter Vitals Group     BP 05/18/23 1551 109/87     Systolic BP Percentile --      Diastolic BP Percentile --      Pulse Rate 05/18/23 1551 (!) 116     Resp 05/18/23 1551 18     Temp 05/18/23 1551 98.3 F (36.8 C)     Temp Source 05/18/23 1551 Oral     SpO2 05/18/23 1551 96 %     Weight --      Height --      Head Circumference --      Peak Flow --      Pain Score 05/18/23 1640 0     Pain Loc --      Pain Education --      Exclude from Growth Chart --   VOZD(66)@     _________________________________________ Significant initial  Findings: Abnormal Labs Reviewed  CBC - Abnormal; Notable for the following components:      Result Value   RBC 3.93 (*)    MCV 101.0 (*)    RDW 16.7 (*)    Platelets 123 (*)    nRBC 0.3 (*)    All other components within normal limits  COMPREHENSIVE METABOLIC PANEL - Abnormal; Notable for the following components:   Sodium 134 (*)    Chloride 93 (*)    Glucose, Bld 198 (*)    BUN 27 (*)    Creatinine, Ser 2.38 (*)    Total Protein 6.2 (*)    Albumin 3.0 (*)    AST 57 (*)    ALT 93 (*)    Alkaline Phosphatase 197 (*)    Total Bilirubin 2.0 (*)    GFR, Estimated 26 (*)    All other components within normal limits  CBG MONITORING, ED - Abnormal; Notable for the following components:   Glucose-Capillary 185 (*)    All other components within normal limits  CBG MONITORING, ED - Abnormal; Notable for the following components:   Glucose-Capillary 180 (*)    All other components within normal limits      _________________________ Troponin  ordered     ECG: Ordered Personally reviewed and interpreted by me showing: HR : 126 Rhythm: Atrial fibrillation with rapid ventricular response Right axis deviation Anteroseptal infarct , age undetermined ST & T wave  abnormality, consider inferolateral ischemia Abnormal ECG QTC 425     COVID-19 Labs  No results for input(s): "DDIMER", "FERRITIN", "LDH", "CRP" in the last 72 hours.  Lab Results  Component Value Date   SARSCOV2NAA NEGATIVE 12/17/2022   SARSCOV2NAA NEGATIVE 10/13/2021   SARSCOV2NAA NEGATIVE 08/25/2021   SARSCOV2NAA NEGATIVE 07/08/2020     The recent clinical data is shown below. Vitals:   05/18/23 1851 05/18/23 1913 05/18/23 2045 05/18/23 2115  BP:   (!) 115/98 110/72  Pulse: (!) 116 (!) 106 (!) 119   Resp: 18 17 19  (!) 22  Temp:   98.7 F (37.1 C)   TempSrc:   Oral   SpO2: 100% (!) 86% 90%     WBC     Component Value Date/Time   WBC 10.5 05/18/2023 1820   LYMPHSABS 1.3 12/18/2022 0727   MONOABS 0.9 12/18/2022 0727   EOSABS 0.0 12/18/2022 0727   BASOSABS 0.0 12/18/2022 0727    __     __________________________________________________________ Recent Labs  Lab 05/18/23 1820  NA 134*  K  4.5  CO2 26  GLUCOSE 198*  BUN 27*  CREATININE 2.38*  CALCIUM 9.0    Cr    stable,   Lab Results  Component Value Date   CREATININE 2.38 (H) 05/18/2023   CREATININE 2.92 (H) 12/20/2022   CREATININE 3.10 (H) 12/19/2022    Recent Labs  Lab 05/18/23 1820  AST 57*  ALT 93*  ALKPHOS 197*  BILITOT 2.0*  PROT 6.2*  ALBUMIN 3.0*   Lab Results  Component Value Date   CALCIUM 9.0 05/18/2023   PHOS 5.5 (H) 10/19/2021    Plt: Lab Results  Component Value Date   PLT 123 (L) 05/18/2023       Recent Labs  Lab 05/18/23 1820  WBC 10.5  HGB 13.2  HCT 39.7  MCV 101.0*  PLT 123*    HG/HCT  stable     Component Value Date/Time   HGB 13.2 05/18/2023 1820   HCT 39.7 05/18/2023 1820   MCV 101.0 (H) 05/18/2023 1820    _______________________________________________ Hospitalist was called for admission for   Atrial fibrillation w rvr     The following Work up has been ordered so far:  Orders Placed This Encounter  Procedures   Critical Care   DG Chest  Portable 1 View   CBC   Urinalysis, Routine w reflex microscopic -Urine, Clean Catch   Comprehensive metabolic panel   Document Height and Actual Weight   ED Cardiac monitoring   Consult to hospitalist   CBG monitoring, ED   CBG monitoring, ED   ED EKG     OTHER Significant initial  Findings:  labs showing:     DM  labs:  HbA1C: Recent Labs    06/30/22 1615 12/18/22 0717  HGBA1C 8.0* 8.8*      CBG (last 3)  Recent Labs    05/18/23 1700 05/18/23 1739  GLUCAP 185* 180*    Cultures:    Component Value Date/Time   SDES BLOOD RIGHT HAND 12/18/2022 0716   SPECREQUEST  12/18/2022 0716    BOTTLES DRAWN AEROBIC ONLY Blood Culture results may not be optimal due to an inadequate volume of blood received in culture bottles   CULT  12/18/2022 0716    NO GROWTH 5 DAYS Performed at [facility], [address], [place], [place] [address]    REPTSTATUS 12/23/2022 FINAL 12/18/2022 0716     Radiological Exams on Admission: No results found. _______________________________________________________________________________________________________ Latest  Blood pressure 110/72, pulse (!) 119, temperature 98.7 F (37.1 C), temperature source Oral, resp. rate (!) 22, SpO2 90%.   Vitals  labs and radiology finding personally reviewed  Review of Systems:    Pertinent positives include:  fatigue,   non-productive cough, Constitutional:  No weight loss, night sweats, Fevers, chills, weight loss  HEENT:  No headaches, Difficulty swallowing,Tooth/dental problems,Sore throat,  No sneezing, itching, ear ache, nasal congestion, post nasal drip,  Cardio-vascular:  No chest pain, Orthopnea, PND, anasarca, dizziness, palpitations.no Bilateral lower extremity swelling  GI:  No heartburn, indigestion, abdominal pain, nausea, vomiting, diarrhea, change in bowel habits, loss of appetite, melena, blood in stool, hematemesis Resp:  no shortness of breath at rest. No dyspnea on exertion,  No excess mucus, no productive cough, No No coughing up of blood.No change in color of mucus.No wheezing. Skin:  no rash or lesions. No jaundice GU:  no dysuria, change in color of urine, no urgency or frequency. No straining to urinate.  No flank pain.  Musculoskeletal:  No  joint pain or no joint swelling. No decreased range of motion. No back pain.  Psych:  No change in mood or affect. No depression or anxiety. No memory loss.  Neuro: no localizing neurological complaints, no tingling, no weakness, no double vision, no gait abnormality, no slurred speech, no confusion  All systems reviewed and apart from HOPI all are negative _______________________________________________________________________________________________ Past Medical History:   Past Medical History:  Diagnosis Date   Arthritis    CHF (congestive heart failure) (HCC)    HFrEF   CKD (chronic kidney disease)    [facility] [place]   Coronary artery disease    Diabetes (HCC)    type 2   sees endo at [place] in [place]   ESRD (end stage renal disease) on dialysis [facility])    M,W,F   Hypercholesteremia    Hypertension    Myocardial infarction (HCC) 1990   Neuromuscular disorder (HCC)    Pneumonia    PONV (postoperative nausea and vomiting)      Past Surgical History:  Procedure Laterality Date   AV FISTULA PLACEMENT Left 05/17/2021   Procedure: LEFT ARM ARTERIOVENOUS (AV) FISTULA CREATION;  Surgeon: Victorino Sparrow, MD;  Location: [facility] OR;  Service: Vascular;  Laterality: Left;  PERIPHERAL NERVE BLOCK   AV FISTULA PLACEMENT Left 05/02/2022   Procedure: INSERTION OF LEFT ARTERIOVENOUS (AV) GORE-TEX GRAFT;  Surgeon: Victorino Sparrow, MD;  Location: [facility] OR;  Service: Vascular;  Laterality: Left;   BACK SURGERY  yrs ago   lower   CARDIAC CATHETERIZATION     CATARACT EXTRACTION Right 01/2021   CHOLECYSTECTOMY N/A 05/02/2017   Procedure: LAPAROSCOPIC CHOLECYSTECTOMY;  Surgeon: Axel Filler, MD;  Location: [facility]  OR;  Service: General;  Laterality: N/A;   CORONARY ANGIOPLASTY     2 1990   EYE SURGERY Left 03/2021   cataract removal   IR FLUORO GUIDE CV LINE RIGHT  10/15/2021   IR US GUIDE VASC ACCESS RIGHT  10/15/2021   KNEE SURGERY Left yrs ago   arthroscopic   LIGATION OF COMPETING BRANCHES OF ARTERIOVENOUS FISTULA Left 11/22/2021   Procedure: LEFT ARM FISTULA BRANCH LIGATION;  Surgeon: Victorino Sparrow, MD;  Location: [facility] OR;  Service: Vascular;  Laterality: Left;   PERIPHERAL VASCULAR BALLOON ANGIOPLASTY  10/19/2021   Procedure: PERIPHERAL VASCULAR BALLOON ANGIOPLASTY;  Surgeon: Victorino Sparrow, MD;  Location: [facility] INVASIVE CV LAB;  Service: Cardiovascular;;  Left AVF   ROTATOR CUFF REPAIR Left yrs ago   SHOULDER OPEN ROTATOR CUFF REPAIR Right 04/23/2013   Procedure: RIGHT SHOULDER ROTATOR CUFF REPAIR WITH GRAFT AND ANCHORS ;  Surgeon: Jacki Cones, MD;  Location: [facility] ORS;  Service: Orthopedics;  Laterality: Right;   TONSILLECTOMY     TOTAL HIP ARTHROPLASTY Right 05/06/2019   Procedure: TOTAL HIP ARTHROPLASTY ANTERIOR APPROACH;  Surgeon: Durene Romans, MD;  Location: [facility] ORS;  Service: Orthopedics;  Laterality: Right;  70 mins    Social History:  Ambulatory  cane, walker      reports that he quit smoking about 34 years ago. His smoking use included cigars. He has never been exposed to tobacco smoke. He has never used smokeless tobacco. He reports that he does not currently use alcohol. He reports that he does not use drugs.   Family History:   Family History  Problem Relation Age of Onset   Dementia Mother    Stroke Brother    ______________________________________________________________________________________________ Allergies: Allergies  Allergen Reactions   Zestril [Lisinopril] Cough  Hydrocodone Nausea Only   Januvia [Sitagliptin] Other (See Comments)    Unknown   Levitra [Vardenafil] Other (See Comments)    flushing   Prednisone Other (See Comments)    Elevates BGL;  patient prefers to not take this   Victoza [Liraglutide] Other (See Comments)    GI upset   Penicillins Rash and Other (See Comments)    Rash around ankles Has patient had a PCN reaction causing immediate rash, facial/tongue/throat swelling, SOB or lightheadedness with hypotension: Yes Has patient had a PCN reaction causing severe rash involving mucus membranes or skin necrosis: Unk Has patient had a PCN reaction that required hospitalization: No Has patient had a PCN reaction occurring within the last 10 years: No If all of the above answers are "NO", then may proceed with Cephalosporin use.      Prior to Admission medications   Medication Sig Start Date End Date Taking? Authorizing Provider  acetaminophen (TYLENOL) 650 MG CR tablet Take 1,300 mg by mouth in the morning, at noon, and at bedtime.    [provider]  allopurinol (ZYLOPRIM) 100 MG tablet Take 100 mg by mouth in the morning.    [provider]  aspirin EC 81 MG tablet Take 81 mg by mouth daily with supper.    [provider]  atorvastatin (LIPITOR) 40 MG tablet Take 40 mg by mouth at bedtime.    [provider]  CALCITRIOL PO Take 2 capsules by mouth every Monday, Wednesday, and Friday with hemodialysis.    [provider]  cyclobenzaprine (FLEXERIL) 10 MG tablet Take 1 tablet (10 mg total) by mouth 2 (two) times daily as needed for muscle spasms. 03/25/23   Alvira Monday, MD  finasteride (PROSCAR) 5 MG tablet Take 5 mg by mouth daily with supper.    [provider]  gabapentin (NEURONTIN) 300 MG capsule Take 1 capsule (300 mg total) by mouth at bedtime. 06/09/22   Lars Mage, PA-C  insulin aspart protamine - aspart (NOVOLOG MIX 70/30 FLEXPEN) (70-30) 100 UNIT/ML FlexPen Inject 14-20 Units into the skin daily as needed (high blood sugar). If BS is <150=0 units, If BS is 150-200 units=14 units, If BS is >201=20 units    [provider]  IRON SUCROSE IV Inject  into the vein as needed. At dialysis    [provider]  lidocaine (LIDODERM) 5 % Place 1 patch onto the skin daily. Remove & Discard patch within 12 hours or as directed by MD 03/25/23   Alvira Monday, MD  metoprolol succinate (TOPROL-XL) 50 MG 24 hr tablet Take 1 tablet (50 mg total) by mouth daily. Take with or immediately following a meal. 06/20/22   Patwardhan, Manish J, MD  tamsulosin (FLOMAX) 0.4 MG CAPS capsule Take 1 capsule (0.4 mg total) by mouth daily after supper. Patient taking differently: Take 0.4 mg by mouth at bedtime. 07/11/20   Albertine Grates, MD  traMADol (ULTRAM) 50 MG tablet Take 1 tablet (50 mg total) by mouth every 6 (six) hours as needed. Patient not taking: Reported on 06/20/2022 05/02/22   Emilie Rutter, PA-C    ___________________________________________________________________________________________________ Physical Exam:    05/18/2023    9:15 PM 05/18/2023    8:45 PM 05/18/2023    7:13 PM  Vitals with BMI  Systolic 110 115   Diastolic 72 98   Pulse  119 106     1. General:  in No  Acute distress   Chronically ill   -appearing 2. Psychological: Alert  and   Oriented 3. Head/ENT:    Dry Mucous Membranes                          Head Non traumatic, neck supple                         Poor Dentition 4. SKIN:  decreased Skin turgor,  Skin clean Dry and intact no rash    5. Heart: irRegular rate and rhythm no  Murmur, no Rub or gallop 6. Lungs: no wheezes or crackles   7. Abdomen: Soft,  non-tender, Non distended   obese  bowel sounds present 8. Lower extremities: no clubbing, cyanosis edema 9. Neurologically Grossly intact, moving all 4 extremities equally   10. MSK: Normal range of motion    Chart has been reviewed  ______________________________________________________________________________________________  Assessment/Plan 82 y.o. male with medical history significant of ESRD on HD MWF ,  DM2, systolic/diastolic heart failure ef 30-35%,  HLD, anemia, CAD, HTN, hx of MI, gout,   Admitted for   Atrial fibrillation w RVR, hypotension   Present on Admission:  Atrial fibrillation with RVR (HCC)  Essential hypertension  Hyperlipidemia  Coronary artery disease involving native coronary artery of native heart without angina pectoris  Chronic combined systolic and diastolic CHF (congestive heart failure) (HCC)  ESRD (end stage renal disease) (HCC)  Hypotension  Acute respiratory failure with hypoxia (HCC)     Atrial fibrillation with RVR (HCC)  - Admit to progressive  Did not toelrate dil drip  Now hr in 110  If becomes sig tachycardiac will probably need to start amiodarone  Cardiology has been aware       CHA2D-VASC score 6       Pt would like to hold off on [facility] for now given risk of bleeding   Will need further discussion with cariology       Check TSH      Cycle cardiac enzymes      Obtain ECHO      Cardiology consulted   DM2 (diabetes mellitus, type 2) (HCC)  - Order Sensitive  SSI    -  check TSH and HgA1C NPH 10 units     Essential hypertension Allow permissive HTN   Hyperlipidemia Chronic stable cont home meds Lipitor 40 mg po q day    Coronary artery disease involving native coronary artery of native heart without angina pectoris Chornic stable restrt aspirin when able  Cont lipitor 40g po q day  Chronic combined systolic and diastolic CHF (congestive heart failure) (HCC) Currently appears euvolemic  ESRD (end stage renal disease) (HCC) On HD MWF Nephrology is aware will see in AM   Ok to give IV contrast  Hypotension Hold BP meds  Did not tolerate diltizem  Start on midodrine  And stress dose steroids pt has been on prednisone  Acute respiratory failure with hypoxia (HCC)  this patient has acute respiratory failure with Hypoxia  documented by the presence of following: O2 saturatio< 90% on RA   CTA ordered Provide O2 therapy and titrate as needed  Continuous pulse ox   check Pulse  ox with ambulation prior to discharge   may need  TC consult for home O2 set up    flutter valve ordered   Other plan as per orders.  DVT prophylaxis:  SCD     Code Status:    Code Status: Prior FULL CODE  as per patient   I had personally discussed CODE STATUS with patient and family  ACP   none   Family Communication:   Family  at  Bedside  plan of care was discussed   with  Daughter, Wife,   Diet heart healthy   Disposition Plan:      To home once workup is complete and patient is stable   Following barriers for discharge:                                                          Electrolytes corrected                                                          Will likely need home health, home O2, set up                           Will need consultants to evaluate patient prior to discharge       Consult Orders  (From admission, onward)           Start     Ordered   05/18/23 2052  Consult to hospitalist  Paged by autumn  Once       Provider:  (Not yet assigned)  Question Answer Comment  Place call to: [facility]   Reason for Consult Admit      05/18/23 2051                               Consults called: Nephrology and Cardiology are aware   Admission status:  ED Disposition     ED Disposition  Admit   Condition  --   Comment  Hospital [facility] [[id number]]  Level of Care: Progressive [102]  Admit to Progressive based on following criteria: CARDIOVASCULAR & THORACIC of moderate stability with acute coronary syndrome symptoms/low risk myocardial infarction/hypertensive urgency/arrhythmias/heart failure potentially compromising stability and stable post cardiovascular intervention patients.  May place patient in observation at [facility] or [facility] if equivalent level of care is available:: No  Covid Evaluation: Asymptomatic - no recent exposure (last 10 days) testing not required  Diagnosis: Atrial fibrillation with  RVR [facility]) [[id number]]  Admitting Physician: Therisa Doyne [[id number]]  Attending Physician: Therisa Doyne [[id number]]          Obs      Level of care         progressive     tele indefinitely please discontinue once patient no longer qualifies COVID-19 Labs   Thai Burgueno 05/18/2023, 11:28 PM    [facility]     after 2 AM please page floor coverage PA If 7AM-7PM, please contact the day team taking care of the patient using [email or web address]

## 2023-05-18 NOTE — Assessment & Plan Note (Signed)
Chronic stable cont home meds Lipitor 40 mg po q day

## 2023-05-19 ENCOUNTER — Other Ambulatory Visit: Payer: Self-pay

## 2023-05-19 ENCOUNTER — Encounter (HOSPITAL_COMMUNITY): Payer: Self-pay | Admitting: Internal Medicine

## 2023-05-19 DIAGNOSIS — I5042 Chronic combined systolic (congestive) and diastolic (congestive) heart failure: Secondary | ICD-10-CM

## 2023-05-19 DIAGNOSIS — E7800 Pure hypercholesterolemia, unspecified: Secondary | ICD-10-CM | POA: Diagnosis present

## 2023-05-19 DIAGNOSIS — R262 Difficulty in walking, not elsewhere classified: Secondary | ICD-10-CM | POA: Diagnosis not present

## 2023-05-19 DIAGNOSIS — I10 Essential (primary) hypertension: Secondary | ICD-10-CM | POA: Diagnosis not present

## 2023-05-19 DIAGNOSIS — M109 Gout, unspecified: Secondary | ICD-10-CM | POA: Diagnosis not present

## 2023-05-19 DIAGNOSIS — I5043 Acute on chronic combined systolic (congestive) and diastolic (congestive) heart failure: Secondary | ICD-10-CM | POA: Diagnosis not present

## 2023-05-19 DIAGNOSIS — R053 Chronic cough: Secondary | ICD-10-CM | POA: Diagnosis present

## 2023-05-19 DIAGNOSIS — R1311 Dysphagia, oral phase: Secondary | ICD-10-CM | POA: Diagnosis not present

## 2023-05-19 DIAGNOSIS — I2510 Atherosclerotic heart disease of native coronary artery without angina pectoris: Secondary | ICD-10-CM | POA: Diagnosis not present

## 2023-05-19 DIAGNOSIS — R269 Unspecified abnormalities of gait and mobility: Secondary | ICD-10-CM | POA: Diagnosis not present

## 2023-05-19 DIAGNOSIS — Z741 Need for assistance with personal care: Secondary | ICD-10-CM | POA: Diagnosis not present

## 2023-05-19 DIAGNOSIS — I959 Hypotension, unspecified: Secondary | ICD-10-CM | POA: Diagnosis not present

## 2023-05-19 DIAGNOSIS — I499 Cardiac arrhythmia, unspecified: Secondary | ICD-10-CM | POA: Diagnosis not present

## 2023-05-19 DIAGNOSIS — N186 End stage renal disease: Secondary | ICD-10-CM | POA: Diagnosis not present

## 2023-05-19 DIAGNOSIS — N250 Renal osteodystrophy: Secondary | ICD-10-CM | POA: Diagnosis not present

## 2023-05-19 DIAGNOSIS — K760 Fatty (change of) liver, not elsewhere classified: Secondary | ICD-10-CM | POA: Diagnosis not present

## 2023-05-19 DIAGNOSIS — R531 Weakness: Secondary | ICD-10-CM | POA: Diagnosis not present

## 2023-05-19 DIAGNOSIS — I4891 Unspecified atrial fibrillation: Secondary | ICD-10-CM | POA: Diagnosis not present

## 2023-05-19 DIAGNOSIS — I361 Nonrheumatic tricuspid (valve) insufficiency: Secondary | ICD-10-CM | POA: Diagnosis not present

## 2023-05-19 DIAGNOSIS — Z7401 Bed confinement status: Secondary | ICD-10-CM | POA: Diagnosis not present

## 2023-05-19 DIAGNOSIS — I132 Hypertensive heart and chronic kidney disease with heart failure and with stage 5 chronic kidney disease, or end stage renal disease: Secondary | ICD-10-CM | POA: Diagnosis not present

## 2023-05-19 DIAGNOSIS — Z823 Family history of stroke: Secondary | ICD-10-CM | POA: Diagnosis not present

## 2023-05-19 DIAGNOSIS — I953 Hypotension of hemodialysis: Secondary | ICD-10-CM | POA: Diagnosis not present

## 2023-05-19 DIAGNOSIS — D631 Anemia in chronic kidney disease: Secondary | ICD-10-CM | POA: Diagnosis not present

## 2023-05-19 DIAGNOSIS — M51369 Other intervertebral disc degeneration, lumbar region without mention of lumbar back pain or lower extremity pain: Secondary | ICD-10-CM | POA: Diagnosis not present

## 2023-05-19 DIAGNOSIS — E1122 Type 2 diabetes mellitus with diabetic chronic kidney disease: Secondary | ICD-10-CM | POA: Diagnosis not present

## 2023-05-19 DIAGNOSIS — I5020 Unspecified systolic (congestive) heart failure: Secondary | ICD-10-CM | POA: Diagnosis not present

## 2023-05-19 DIAGNOSIS — E1142 Type 2 diabetes mellitus with diabetic polyneuropathy: Secondary | ICD-10-CM | POA: Diagnosis not present

## 2023-05-19 DIAGNOSIS — M6281 Muscle weakness (generalized): Secondary | ICD-10-CM | POA: Diagnosis not present

## 2023-05-19 DIAGNOSIS — E872 Acidosis, unspecified: Secondary | ICD-10-CM | POA: Diagnosis not present

## 2023-05-19 DIAGNOSIS — I5040 Unspecified combined systolic (congestive) and diastolic (congestive) heart failure: Secondary | ICD-10-CM | POA: Diagnosis not present

## 2023-05-19 DIAGNOSIS — D72829 Elevated white blood cell count, unspecified: Secondary | ICD-10-CM | POA: Diagnosis not present

## 2023-05-19 DIAGNOSIS — N400 Enlarged prostate without lower urinary tract symptoms: Secondary | ICD-10-CM | POA: Diagnosis present

## 2023-05-19 DIAGNOSIS — E785 Hyperlipidemia, unspecified: Secondary | ICD-10-CM | POA: Diagnosis not present

## 2023-05-19 DIAGNOSIS — J9601 Acute respiratory failure with hypoxia: Secondary | ICD-10-CM | POA: Diagnosis not present

## 2023-05-19 DIAGNOSIS — I252 Old myocardial infarction: Secondary | ICD-10-CM | POA: Diagnosis not present

## 2023-05-19 DIAGNOSIS — E871 Hypo-osmolality and hyponatremia: Secondary | ICD-10-CM | POA: Diagnosis not present

## 2023-05-19 DIAGNOSIS — L8995 Pressure ulcer of unspecified site, unstageable: Secondary | ICD-10-CM | POA: Diagnosis not present

## 2023-05-19 DIAGNOSIS — S90412A Abrasion, left great toe, initial encounter: Secondary | ICD-10-CM | POA: Diagnosis not present

## 2023-05-19 DIAGNOSIS — Z6831 Body mass index (BMI) 31.0-31.9, adult: Secondary | ICD-10-CM | POA: Diagnosis not present

## 2023-05-19 DIAGNOSIS — I340 Nonrheumatic mitral (valve) insufficiency: Secondary | ICD-10-CM | POA: Diagnosis not present

## 2023-05-19 DIAGNOSIS — I739 Peripheral vascular disease, unspecified: Secondary | ICD-10-CM | POA: Diagnosis not present

## 2023-05-19 DIAGNOSIS — E669 Obesity, unspecified: Secondary | ICD-10-CM | POA: Diagnosis present

## 2023-05-19 DIAGNOSIS — Z992 Dependence on renal dialysis: Secondary | ICD-10-CM | POA: Diagnosis not present

## 2023-05-19 DIAGNOSIS — D696 Thrombocytopenia, unspecified: Secondary | ICD-10-CM | POA: Diagnosis not present

## 2023-05-19 DIAGNOSIS — Z794 Long term (current) use of insulin: Secondary | ICD-10-CM | POA: Diagnosis not present

## 2023-05-19 DIAGNOSIS — E1165 Type 2 diabetes mellitus with hyperglycemia: Secondary | ICD-10-CM | POA: Diagnosis not present

## 2023-05-19 DIAGNOSIS — Z9181 History of falling: Secondary | ICD-10-CM | POA: Diagnosis not present

## 2023-05-19 LAB — COMPREHENSIVE METABOLIC PANEL
ALT: 78 U/L — ABNORMAL HIGH (ref 0–44)
AST: 51 U/L — ABNORMAL HIGH (ref 15–41)
Albumin: 2.5 g/dL — ABNORMAL LOW (ref 3.5–5.0)
Alkaline Phosphatase: 157 U/L — ABNORMAL HIGH (ref 38–126)
Anion gap: 15 (ref 5–15)
BUN: 39 mg/dL — ABNORMAL HIGH (ref 8–23)
CO2: 22 mmol/L (ref 22–32)
Calcium: 8.5 mg/dL — ABNORMAL LOW (ref 8.9–10.3)
Chloride: 95 mmol/L — ABNORMAL LOW (ref 98–111)
Creatinine, Ser: 3.25 mg/dL — ABNORMAL HIGH (ref 0.61–1.24)
GFR, Estimated: 18 mL/min — ABNORMAL LOW (ref >60)
Glucose, Bld: 224 mg/dL — ABNORMAL HIGH (ref 70–99)
Potassium: 5.1 mmol/L (ref 3.5–5.1)
Sodium: 132 mmol/L — ABNORMAL LOW (ref 135–145)
Total Bilirubin: 1.9 mg/dL — ABNORMAL HIGH (ref <1.2)
Total Protein: 5.4 g/dL — ABNORMAL LOW (ref 6.5–8.1)

## 2023-05-19 LAB — PROTIME-INR
INR: 1.2 (ref 0.8–1.2)
Prothrombin Time: 15.7 s — ABNORMAL HIGH (ref 11.4–15.2)

## 2023-05-19 LAB — IRON AND TIBC
Iron: 155 ug/dL (ref 45–182)
Saturation Ratios: 65 % — ABNORMAL HIGH (ref 17.9–39.5)
TIBC: 238 ug/dL — ABNORMAL LOW (ref 250–450)
UIBC: 83 ug/dL

## 2023-05-19 LAB — FERRITIN: Ferritin: 1620 ng/mL — ABNORMAL HIGH (ref 24–336)

## 2023-05-19 LAB — RETICULOCYTES
Immature Retic Fract: 19.2 % — ABNORMAL HIGH (ref 2.3–15.9)
RBC.: 3.36 MIL/uL — ABNORMAL LOW (ref 4.22–5.81)
Retic Count, Absolute: 78.3 10*3/uL (ref 19.0–186.0)
Retic Ct Pct: 2.3 % (ref 0.4–3.1)

## 2023-05-19 LAB — LACTIC ACID, PLASMA
Lactic Acid, Venous: 2.8 mmol/L (ref 0.5–1.9)
Lactic Acid, Venous: 2.8 mmol/L (ref 0.5–1.9)
Lactic Acid, Venous: 4 mmol/L (ref 0.5–1.9)

## 2023-05-19 LAB — TROPONIN I (HIGH SENSITIVITY)
Troponin I (High Sensitivity): 559 ng/L (ref <18)
Troponin I (High Sensitivity): 569 ng/L (ref <18)

## 2023-05-19 LAB — GLUCOSE, CAPILLARY
Glucose-Capillary: 274 mg/dL — ABNORMAL HIGH (ref 70–99)
Glucose-Capillary: 276 mg/dL — ABNORMAL HIGH (ref 70–99)
Glucose-Capillary: 320 mg/dL — ABNORMAL HIGH (ref 70–99)

## 2023-05-19 LAB — BRAIN NATRIURETIC PEPTIDE: B Natriuretic Peptide: 2343.1 pg/mL — ABNORMAL HIGH (ref 0.0–100.0)

## 2023-05-19 LAB — CBC
HCT: 35.1 % — ABNORMAL LOW (ref 39.0–52.0)
Hemoglobin: 11.8 g/dL — ABNORMAL LOW (ref 13.0–17.0)
MCH: 34.8 pg — ABNORMAL HIGH (ref 26.0–34.0)
MCHC: 33.6 g/dL (ref 30.0–36.0)
MCV: 103.5 fL — ABNORMAL HIGH (ref 80.0–100.0)
Platelets: 121 10*3/uL — ABNORMAL LOW (ref 150–400)
RBC: 3.39 MIL/uL — ABNORMAL LOW (ref 4.22–5.81)
RDW: 17 % — ABNORMAL HIGH (ref 11.5–15.5)
WBC: 11.4 10*3/uL — ABNORMAL HIGH (ref 4.0–10.5)
nRBC: 0.5 % — ABNORMAL HIGH (ref 0.0–0.2)

## 2023-05-19 LAB — CBG MONITORING, ED
Glucose-Capillary: 203 mg/dL — ABNORMAL HIGH (ref 70–99)
Glucose-Capillary: 226 mg/dL — ABNORMAL HIGH (ref 70–99)
Glucose-Capillary: 239 mg/dL — ABNORMAL HIGH (ref 70–99)
Glucose-Capillary: 298 mg/dL — ABNORMAL HIGH (ref 70–99)

## 2023-05-19 LAB — VITAMIN B12: Vitamin B-12: 729 pg/mL (ref 180–914)

## 2023-05-19 LAB — PREALBUMIN: Prealbumin: 20 mg/dL (ref 18–38)

## 2023-05-19 LAB — MAGNESIUM
Magnesium: 2 mg/dL (ref 1.7–2.4)
Magnesium: 2.1 mg/dL (ref 1.7–2.4)

## 2023-05-19 LAB — PHOSPHORUS
Phosphorus: 6.2 mg/dL — ABNORMAL HIGH (ref 2.5–4.6)
Phosphorus: 6.5 mg/dL — ABNORMAL HIGH (ref 2.5–4.6)

## 2023-05-19 LAB — FOLATE: Folate: 11.5 ng/mL (ref >5.9)

## 2023-05-19 LAB — TSH: TSH: 1.442 u[IU]/mL (ref 0.350–4.500)

## 2023-05-19 LAB — HEPARIN LEVEL (UNFRACTIONATED): Heparin Unfractionated: 0.7 [IU]/mL (ref 0.30–0.70)

## 2023-05-19 MED ORDER — AMIODARONE HCL IN DEXTROSE 360-4.14 MG/200ML-% IV SOLN
60.0000 mg/h | INTRAVENOUS | Status: AC
  Administered 2023-05-19: 60 mg/h via INTRAVENOUS
  Filled 2023-05-19: qty 200

## 2023-05-19 MED ORDER — HEPARIN BOLUS VIA INFUSION
3500.0000 [IU] | Freq: Once | INTRAVENOUS | Status: AC
  Administered 2023-05-19: 3500 [IU] via INTRAVENOUS
  Filled 2023-05-19: qty 3500

## 2023-05-19 MED ORDER — GABAPENTIN 300 MG PO CAPS
300.0000 mg | ORAL_CAPSULE | Freq: Every day | ORAL | Status: DC
  Administered 2023-05-19 – 2023-05-30 (×11): 300 mg via ORAL
  Filled 2023-05-19 (×11): qty 1

## 2023-05-19 MED ORDER — ACETAMINOPHEN 650 MG RE SUPP: 650.0000 mg | Freq: Four times a day (QID) | RECTAL | Status: DC | PRN

## 2023-05-19 MED ORDER — AMIODARONE HCL IN DEXTROSE 360-4.14 MG/200ML-% IV SOLN
30.0000 mg/h | INTRAVENOUS | Status: DC
  Administered 2023-05-19 – 2023-05-24 (×12): 30 mg/h via INTRAVENOUS
  Filled 2023-05-19 (×10): qty 200

## 2023-05-19 MED ORDER — TAMSULOSIN HCL 0.4 MG PO CAPS
0.4000 mg | ORAL_CAPSULE | Freq: Every day | ORAL | Status: DC
  Administered 2023-05-19 – 2023-05-30 (×11): 0.4 mg via ORAL
  Filled 2023-05-19 (×11): qty 1

## 2023-05-19 MED ORDER — ONDANSETRON HCL 4 MG PO TABS: 4.0000 mg | ORAL_TABLET | Freq: Four times a day (QID) | ORAL | Status: DC | PRN

## 2023-05-19 MED ORDER — ATORVASTATIN CALCIUM 40 MG PO TABS
40.0000 mg | ORAL_TABLET | Freq: Every day | ORAL | Status: DC
  Administered 2023-05-19 – 2023-05-30 (×11): 40 mg via ORAL
  Filled 2023-05-19 (×11): qty 1

## 2023-05-19 MED ORDER — SODIUM CHLORIDE 0.9% FLUSH: 3.0000 mL | INTRAVENOUS | Status: DC | PRN

## 2023-05-19 MED ORDER — SODIUM CHLORIDE 0.9% FLUSH
3.0000 mL | Freq: Two times a day (BID) | INTRAVENOUS | Status: DC
  Administered 2023-05-20 – 2023-05-27 (×13): 3 mL via INTRAVENOUS

## 2023-05-19 MED ORDER — ACETAMINOPHEN 325 MG PO TABS: 650.0000 mg | ORAL_TABLET | Freq: Four times a day (QID) | ORAL | Status: DC | PRN

## 2023-05-19 MED ORDER — ONDANSETRON HCL 4 MG/2ML IJ SOLN: 4.0000 mg | Freq: Four times a day (QID) | INTRAMUSCULAR | Status: DC | PRN

## 2023-05-19 MED ORDER — ALLOPURINOL 100 MG PO TABS
100.0000 mg | ORAL_TABLET | Freq: Every morning | ORAL | Status: DC
  Administered 2023-05-19 – 2023-05-30 (×12): 100 mg via ORAL
  Filled 2023-05-19 (×12): qty 1

## 2023-05-19 MED ORDER — HEPARIN (PORCINE) 25000 UT/250ML-% IV SOLN
950.0000 [IU]/h | INTRAVENOUS | Status: DC
  Administered 2023-05-19: 1000 [IU]/h via INTRAVENOUS
  Administered 2023-05-19: 950 [IU]/h via INTRAVENOUS
  Filled 2023-05-19 (×2): qty 250

## 2023-05-19 MED ORDER — SODIUM CHLORIDE 0.9 % IV SOLN
250.0000 mL | INTRAVENOUS | Status: AC | PRN
Start: 2023-05-19 — End: 2023-05-20

## 2023-05-19 NOTE — Plan of Care (Signed)
Cardiology Hand off note  Key event: Transition to AM service  Patient notes improvement in his symptoms.   Mr. Aye has a history of atrial fibrillation (Afib), rapid ventricular response (RVR), hypotension, and end-stage renal disease (ESRD) presents with symptoms suggestive of volume overload. He has been experiencing a rapid heart rate, which on examination was found to be 102 bpm with transition from diltiazem to amiodarone.   Feels much better  VITALS: P- 102 GENERAL: Alert, in no apparent distress. CHEST: Bilateral crackles at lung bases. CARDIOVASCULAR: Irregular tachycardia. ABDOMEN: Dullness to percussion in the area of the hiatal hernia. EXTREMITIES: Bilateral low extremal pitting edema, +2.  AP:  Atrial Fibrillation with Rapid Ventricular Response (Afib, RVR) Presents with Afib, RVR, currently better controlled with a heart rate of 102 bpm. Exhibits irregular tachycardia. Plan includes rate control and potential transition to oral agents if IV amiodarone achieves adequate control. - Continue IV amiodarone and heparin - tomorrow switch to PO and eliquis if well controlled  Hypotension - Experienced hypotension, which may complicate dialysis. If hypotension persists and affects dialysis tolerance, midodrine management will be considered. - increase midodrine management if unable to tolerate dialysis due to hypotension, unable to get GDMT  End Stage Renal Disease (ESRD) Has ESRD with volume overload, bilateral lower extremity pitting edema (+2), dullness to percussion, and crackles in the lung bases. Dialysis is anticipated to manage volume overload. A more aggressive dry weight may be necessary. - Consider a more aggressive dry weight, will ultimately defer to nephrology  Coordination plan: ED team  Time spent 25 minutes  Riley Lam, MD FASE [facility]  [address] [place], [address] [place], [place] [address] [phone]  2:03  PM

## 2023-05-19 NOTE — Progress Notes (Signed)
ANTICOAGULATION CONSULT NOTE  Pharmacy Consult for Heparin Indication: atrial fibrillation  Allergies  Allergen Reactions   Zestril [Lisinopril] Cough   Hydrocodone Nausea Only   Januvia [Sitagliptin] Other (See Comments)    Unknown   Levitra [Vardenafil] Other (See Comments)    flushing   Prednisone Other (See Comments)    Elevates BGL; patient prefers to not take this   Victoza [Liraglutide] Other (See Comments)    GI upset   Penicillins Rash and Other (See Comments)    Rash around ankles Has patient had a PCN reaction causing immediate rash, facial/tongue/throat swelling, SOB or lightheadedness with hypotension: Yes Has patient had a PCN reaction causing severe rash involving mucus membranes or skin necrosis: Unk Has patient had a PCN reaction that required hospitalization: No Has patient had a PCN reaction occurring within the last 10 years: No If all of the above answers are "NO", then may proceed with Cephalosporin use.     Patient Measurements:   Heparin Dosing Weight: 68 kg  Vital Signs: Temp: 97.4 F (36.3 C) (11/16 1036) Temp Source: Axillary (11/16 1036) BP: 93/56 (11/16 0930) Pulse Rate: 104 (11/16 0930)  Labs: Recent Labs    05/18/23 1820 05/19/23 0024 05/19/23 0848 05/19/23 1015  HGB 13.2  --  11.8*  --   HCT 39.7  --  35.1*  --   PLT 123*  --  121*  --   LABPROT  --  15.7*  --   --   INR  --  1.2  --   --   HEPARINUNFRC  --   --   --  0.70  CREATININE 2.38*  --  3.25*  --   TROPONINIHS  --  559*  --   --     CrCl cannot be calculated (Unknown ideal weight.).   Medical History: Past Medical History:  Diagnosis Date   Arthritis    CHF (congestive heart failure) (HCC)    HFrEF   CKD (chronic kidney disease)    sees [place] in [place]   Coronary artery disease    Diabetes (HCC)    type 2   sees endo at [place] in [place]   ESRD (end stage renal disease) on dialysis [facility])    M,W,F   Hypercholesteremia    Hypertension    Myocardial  infarction (HCC) 1990   Neuromuscular disorder (HCC)    Pneumonia    PONV (postoperative nausea and vomiting)     Medications:  (Not in a hospital admission)  Scheduled:   allopurinol  100 mg Oral q AM   atorvastatin  40 mg Oral QHS   gabapentin  300 mg Oral QHS   hydrocortisone sod succinate (SOLU-CORTEF) inj  100 mg Intravenous Q8H   insulin aspart  0-6 Units Subcutaneous Q4H   insulin NPH Human  10 Units Subcutaneous QAC breakfast   midodrine  5 mg Oral TID WC   sodium chloride flush  3 mL Intravenous Q12H   tamsulosin  0.4 mg Oral QHS   Infusions:   sodium chloride     amiodarone 60 mg/hr (05/19/23 0653)   amiodarone     heparin 1,000 Units/hr (05/19/23 1048)   PRN: sodium chloride, acetaminophen **OR** acetaminophen, ondansetron **OR** ondansetron (ZOFRAN) IV, sodium chloride flush  Assessment: 82 yom with a history of HTN, HLD, CAD, pna, HF, ESRD on HD MWF, T2DM. Patient is presenting with AF RVR. Heparin per pharmacy consult placed for atrial fibrillation.  Initial heparin level upper end therapeutic on 1000  units/hr  Goal of Therapy:  Heparin level 0.3-0.7 units/ml Monitor platelets by anticoagulation protocol: Yes   Plan:  Decrease heparin gtt slightly to 950 units/hr Daily heparin level, CBC, s/s bleeding F/u long term [facility] plan  Daylene Posey, PharmD, [facility] Clinical Pharmacist ED Pharmacist Phone # [phone] 05/19/2023 11:05 AM

## 2023-05-19 NOTE — Progress Notes (Signed)
[facility]                                                                               Robert Lucas, is a 82 y.o. male, DOB - [birth year], OZH:[id number] Admit date - 05/18/2023    Outpatient Primary MD for the patient is Robert Rua, MD  LOS - 0  days    Brief summary    Robert Lucas is a 82 y.o. male with medical history significant of ESRD on HD MWF ,  DM2, systolic/diastolic heart failure ef 30-35%, HLD, anemia, CAD, HTN, hx of MI, gout, Presented with hypotension, atrial fibrillation with RVR. He was started on IV amiodarone and IV cardizem, IV heparin.   Assessment & Plan    Assessment and Plan:  Atrial fibrillation with with RVR: Rate not well controlled.  Initially started on IV Cardizem transitioned to  amiodarone gtt Cardiology on board and continue the IV heparin.  Tsh is wnl.  Echocardiogram  ordered and pending.   Acute on chronic combined CHF: - Fluid management with HD.  - on midodrine 5 mg TID.   Hypotension.  Bp parameters improved with solucortef and midodrine.   Diabetes Mellitus type 2: CBG (last 3)  Recent Labs    05/19/23 0728 05/19/23 1136 05/19/23 1642  GLUCAP 203* 226* 274*   Uncontrolled with hyperglycemia.  Continue with Novolin N 10 units daily.  Add novolog 3 units daily.  Get A1c.     ESRD on HD Nephrology on board and appreciate recommendations.    Hyperlipidemia Resume statin.    BPH Continue with flomax.    Hyponatremia Sodium of 132 from ESRD.   Elevated liver enzymes ? CHF Increased liver echotexture consistent with hepatic steatosis.      Estimated body mass index is 29.26 kg/m as calculated from the following:   Height as of this encounter: 5\' 2"  (1.575 m).   Weight as of this encounter: 72.6 kg.  Code Status: full code.  DVT Prophylaxis:  SCDs Start: 05/19/23 0549   Level of Care: Level of care: Progressive Family Communication: none at bedside.   Disposition Plan:      Remains inpatient appropriate:  pending   Procedures:  None.   Consultants:   Cardiology.   Antimicrobials:   Anti-infectives (From admission, onward)    None        Medications  Scheduled Meds:  allopurinol  100 mg Oral q AM   atorvastatin  40 mg Oral QHS   gabapentin  300 mg Oral QHS   hydrocortisone sod succinate (SOLU-CORTEF) inj  100 mg Intravenous Q8H   insulin aspart  0-6 Units Subcutaneous Q4H   insulin NPH Human  10 Units Subcutaneous QAC breakfast   midodrine  5 mg Oral TID WC   sodium chloride flush  3 mL Intravenous Q12H   tamsulosin  0.4 mg Oral QHS   Continuous Infusions:  sodium chloride     amiodarone 30 mg/hr (05/19/23 1224)   heparin 950 Units/hr (05/19/23 1143)   PRN Meds:.sodium chloride, acetaminophen **OR** acetaminophen, ondansetron **OR** ondansetron (ZOFRAN) IV, sodium chloride flush    Subjective:   Robert Lucas was seen and examined  today.  Feel the same, on 3 to 4 lit of [place] oxygen.   Objective:   Vitals:   05/19/23 0930 05/19/23 1036 05/19/23 1140 05/19/23 1338  BP: (!) 93/56  (!) 132/91 97/60  Pulse: (!) 104  (!) 116 (!) 113  Resp: (!) 23  17 (!) 22  Temp:  (!) 97.4 F (36.3 C)    TempSrc:  Axillary    SpO2: 91%  94% 97%  Weight:   72.6 kg   Height:   5\' 2"  (1.575 m)    No intake or output data in the 24 hours ending 05/19/23 1444 Filed Weights   05/19/23 1140  Weight: 72.6 kg     Exam General exam: Appears calm and comfortable  Respiratory system: diminished at bases Cardiovascular system: S1 & S2 heard, irregularly irregular. Pedal edema.  Gastrointestinal system: Abdomen is nondistended, soft and nontender.  Central nervous system: Alert and oriented. Extremities: Symmetric 5 x 5 power. Skin: No rashes, Psychiatry: Judgement and insight appear normal. Mood & affect appropriate.     Data Reviewed:  I have personally reviewed following labs and imaging studies   CBC Lab Results  Component Value Date    WBC 11.4 (H) 05/19/2023   RBC 3.39 (L) 05/19/2023   HGB 11.8 (L) 05/19/2023   HCT 35.1 (L) 05/19/2023   MCV 103.5 (H) 05/19/2023   MCH 34.8 (H) 05/19/2023   PLT 121 (L) 05/19/2023   MCHC 33.6 05/19/2023   RDW 17.0 (H) 05/19/2023   LYMPHSABS 1.3 12/18/2022   MONOABS 0.9 12/18/2022   EOSABS 0.0 12/18/2022   BASOSABS 0.0 12/18/2022     Last metabolic panel Lab Results  Component Value Date   NA 132 (L) 05/19/2023   K 5.1 05/19/2023   CL 95 (L) 05/19/2023   CO2 22 05/19/2023   BUN 39 (H) 05/19/2023   CREATININE 3.25 (H) 05/19/2023   GLUCOSE 224 (H) 05/19/2023   GFRNONAA 18 (L) 05/19/2023   GFRAA 26 (L) 05/07/2019   CALCIUM 8.5 (L) 05/19/2023   PHOS 6.5 (H) 05/19/2023   PROT 5.4 (L) 05/19/2023   ALBUMIN 2.5 (L) 05/19/2023   BILITOT 1.9 (H) 05/19/2023   ALKPHOS 157 (H) 05/19/2023   AST 51 (H) 05/19/2023   ALT 78 (H) 05/19/2023   ANIONGAP 15 05/19/2023    CBG (last 3)  Recent Labs    05/19/23 0359 05/19/23 0728 05/19/23 1136  GLUCAP 298* 203* 226*      Coagulation Profile: Recent Labs  Lab 05/19/23 0024  INR 1.2     Radiology Studies: CT Angio Chest Pulmonary Embolism (PE) W or WO Contrast  Result Date: 05/18/2023 CLINICAL DATA:  Hypotensive following dialysis treatment, initial encounter EXAM: CT ANGIOGRAPHY CHEST WITH CONTRAST TECHNIQUE: Multidetector CT imaging of the chest was performed using the standard protocol during bolus administration of intravenous contrast. Multiplanar CT image reconstructions and MIPs were obtained to evaluate the vascular anatomy. RADIATION DOSE REDUCTION: This exam was performed according to the departmental dose-optimization program which includes automated exposure control, adjustment of the mA and/or kV according to patient size and/or use of iterative reconstruction technique. CONTRAST:  75mL OMNIPAQUE IOHEXOL 350 MG/ML SOLN COMPARISON:  Chest x-ray from earlier in the same day. FINDINGS: Cardiovascular: Atherosclerotic  calcifications of the thoracic aorta are noted. No aneurysmal dilatation is seen. Cardiac shadow is enlarged. Coronary calcifications are noted. The pulmonary artery shows a normal branching pattern bilaterally. No filling defects to suggest pulmonary emboli are seen. Mediastinum/Nodes: Thoracic inlet is within  normal limits. No hilar or mediastinal adenopathy is noted. The esophagus as visualized is within normal limits. Lungs/Pleura: Small bilateral pleural effusions right greater than left are seen. Mild bilateral atelectatic changes are noted. Upper Abdomen: Visualized upper abdomen demonstrates mild ascites. Kidneys are shrunken consistent with the known history of end-stage renal disease. Musculoskeletal: Degenerative changes of the thoracic spine are noted stable from the prior exam. Review of the MIP images confirms the above findings. IMPRESSION: No evidence of pulmonary emboli. Bilateral small effusions with associated atelectasis right greater than left. No other acute abnormality is noted. Aortic Atherosclerosis (ICD10-I70.0). Electronically Signed   By: Alcide Clever M.D.   On: 05/18/2023 23:56   US Abdomen Limited RUQ (LIVER/GB)  Result Date: 05/18/2023 CLINICAL DATA:  Elevated liver function tests EXAM: ULTRASOUND ABDOMEN LIMITED RIGHT UPPER QUADRANT COMPARISON:  11/15/2017 FINDINGS: Gallbladder: Prior cholecystectomy. Common bile duct: Diameter: 5 mm Liver: Diffuse increased liver echotexture consistent with hepatic steatosis. No focal liver abnormality. Portal vein is patent on color Doppler imaging with normal direction of blood flow towards the liver. Other: Incidental right pleural effusion. Trace right upper quadrant ascites. IMPRESSION: 1. Increased liver echotexture consistent with hepatic steatosis. 2. Prior cholecystectomy. 3. Trace right upper quadrant ascites. 4. Incidental right pleural effusion. Electronically Signed   By: Sharlet Salina M.D.   On: 05/18/2023 23:11   DG Chest  Portable 1 View  Result Date: 05/18/2023 CLINICAL DATA:  Hypotension, tachycardia, atrial fibrillation EXAM: PORTABLE CHEST 1 VIEW COMPARISON:  03/25/2023 FINDINGS: Two frontal views of the chest demonstrate an enlarged cardiac silhouette. Chronic central vascular prominence, without acute airspace disease, effusion, or pneumothorax. Linear consolidation at the left lung base likely reflects subsegmental atelectasis. No acute bony abnormalities. IMPRESSION: 1. Enlarged cardiac silhouette, with chronic central vascular congestion. No overt edema. Electronically Signed   By: Sharlet Salina M.D.   On: 05/18/2023 22:22       Kathlen Mody M.D. [facility] 05/19/2023, 2:44 PM  Available via Epic secure chat 7am-7pm After 7 pm, please refer to night coverage provider listed on amion.

## 2023-05-19 NOTE — Consult Note (Signed)
[facility]  INPATIENT CONSULTATION  Reason for Consultation: ESRD Requesting Provider: Dr. Blake Divine  HPI: Robert Lucas is an 82 y.o. male with ESRD on HD MWF, HTN, DM, CAD, combined CHF (2023 grade 1 DD and EF 45%) currently hospitalized with new A fib complicated by hypotension and nephrology is consulted for evaluation and management of ESRD.   Developed A fib with RVR at end of dialysis yesterday and presented through ED.  Initially requiring 4L now weaned to 2L. Labs with K 4.5, Ca 9, BUN 27, Cr 2.4, Hb 13.2.   Didn't tolerate dilt with hypotension, now on amio.  Cardiology c/s pending.  TTE pending.  CXR chronic central congestion w/o edema.  CTA chest no PEs and very small pleural effusions.  RUQ US hepatic steatosis.   Pt currently feeling ok - says has had chronic cough for months.  Doesn't really endorse orthopnea or worse PM prior to [facility] HD after long weekend.  Cardiology has mentioned concerned for chronic vol overload.   PMH: Past Medical History:  Diagnosis Date   Arthritis    CHF (congestive heart failure) (HCC)    HFrEF   CKD (chronic kidney disease)    sees [place] in [place]   Coronary artery disease    Diabetes (HCC)    type 2   sees endo at [place] in [place]   ESRD (end stage renal disease) on dialysis [facility])    M,W,F   Hypercholesteremia    Hypertension    Myocardial infarction (HCC) 1990   Neuromuscular disorder (HCC)    Pneumonia    PONV (postoperative nausea and vomiting)    PSH: Past Surgical History:  Procedure Laterality Date   AV FISTULA PLACEMENT Left 05/17/2021   Procedure: LEFT ARM ARTERIOVENOUS (AV) FISTULA CREATION;  Surgeon: Victorino Sparrow, MD;  Location: [facility] OR;  Service: Vascular;  Laterality: Left;  PERIPHERAL NERVE BLOCK   AV FISTULA PLACEMENT Left 05/02/2022   Procedure: INSERTION OF LEFT ARTERIOVENOUS (AV) GORE-TEX GRAFT;  Surgeon: Victorino Sparrow, MD;  Location: [facility] OR;  Service: Vascular;  Laterality: Left;   BACK  SURGERY  yrs ago   lower   CARDIAC CATHETERIZATION     CATARACT EXTRACTION Right 01/2021   CHOLECYSTECTOMY N/A 05/02/2017   Procedure: LAPAROSCOPIC CHOLECYSTECTOMY;  Surgeon: Axel Filler, MD;  Location: [facility] OR;  Service: General;  Laterality: N/A;   CORONARY ANGIOPLASTY     2 1990   EYE SURGERY Left 03/2021   cataract removal   IR FLUORO GUIDE CV LINE RIGHT  10/15/2021   IR US GUIDE VASC ACCESS RIGHT  10/15/2021   KNEE SURGERY Left yrs ago   arthroscopic   LIGATION OF COMPETING BRANCHES OF ARTERIOVENOUS FISTULA Left 11/22/2021   Procedure: LEFT ARM FISTULA BRANCH LIGATION;  Surgeon: Victorino Sparrow, MD;  Location: [facility] OR;  Service: Vascular;  Laterality: Left;   PERIPHERAL VASCULAR BALLOON ANGIOPLASTY  10/19/2021   Procedure: PERIPHERAL VASCULAR BALLOON ANGIOPLASTY;  Surgeon: Victorino Sparrow, MD;  Location: [facility] INVASIVE CV LAB;  Service: Cardiovascular;;  Left AVF   ROTATOR CUFF REPAIR Left yrs ago   SHOULDER OPEN ROTATOR CUFF REPAIR Right 04/23/2013   Procedure: RIGHT SHOULDER ROTATOR CUFF REPAIR WITH GRAFT AND ANCHORS ;  Surgeon: Jacki Cones, MD;  Location: [facility] ORS;  Service: Orthopedics;  Laterality: Right;   TONSILLECTOMY     TOTAL HIP ARTHROPLASTY Right 05/06/2019   Procedure: TOTAL HIP ARTHROPLASTY ANTERIOR APPROACH;  Surgeon: Durene Romans, MD;  Location: [facility] ORS;  Service: Orthopedics;  Laterality: Right;  70 mins    Past Medical History:  Diagnosis Date   Arthritis    CHF (congestive heart failure) (HCC)    HFrEF   CKD (chronic kidney disease)    sees [place] in [place]   Coronary artery disease    Diabetes (HCC)    type 2   sees endo at [place] in [place]   ESRD (end stage renal disease) on dialysis [facility])    M,W,F   Hypercholesteremia    Hypertension    Myocardial infarction (HCC) 1990   Neuromuscular disorder (HCC)    Pneumonia    PONV (postoperative nausea and vomiting)     Medications:  I have reviewed the patient's current medications.  (Not in a  hospital admission)   ALLERGIES:   Allergies  Allergen Reactions   Zestril [Lisinopril] Cough   Hydrocodone Nausea Only   Januvia [Sitagliptin] Other (See Comments)    Unknown   Levitra [Vardenafil] Other (See Comments)    flushing   Prednisone Other (See Comments)    Elevates BGL; patient prefers to not take this   Victoza [Liraglutide] Other (See Comments)    GI upset   Penicillins Rash and Other (See Comments)    Rash around ankles Has patient had a PCN reaction causing immediate rash, facial/tongue/throat swelling, SOB or lightheadedness with hypotension: Yes Has patient had a PCN reaction causing severe rash involving mucus membranes or skin necrosis: Unk Has patient had a PCN reaction that required hospitalization: No Has patient had a PCN reaction occurring within the last 10 years: No If all of the above answers are "NO", then may proceed with Cephalosporin use.     FAM HX: Family History  Problem Relation Age of Onset   Dementia Mother    Stroke Brother     Social History:   reports that he quit smoking about 34 years ago. His smoking use included cigars. He has never been exposed to tobacco smoke. He has never used smokeless tobacco. He reports that he does not currently use alcohol. He reports that he does not use drugs.  ROS: 12 system ROS neg except per HPI  Blood pressure 93/78, pulse (!) 117, temperature 98.4 F (36.9 C), resp. rate 18, SpO2 94%. PHYSICAL EXAM: Gen: elderly man who is comfortably lying in bed  Eyes: glasses, EOMI ENT: MMM CV: A fib rate in 110s on monitor Abd: soft, nontender Back: lungs clear but dec BS bases, no rales, on [place] O2 Extr: no edema, LUE AVG +ecchymoses from infiltration weeks ago, no erythema or warmth, +t/b Neuro: conversant non focal    Results for orders placed or performed during the hospital encounter of 05/18/23 (from the past 48 hour(s))  CBG monitoring, ED     Status: Abnormal   Collection Time: 05/18/23  5:00  PM  Result Value Ref Range   Glucose-Capillary 185 (H) 70 - 99 mg/dL    Comment: Glucose reference range applies only to samples taken after fasting for at least 8 hours.  CBG monitoring, ED     Status: Abnormal   Collection Time: 05/18/23  5:39 PM  Result Value Ref Range   Glucose-Capillary 180 (H) 70 - 99 mg/dL    Comment: Glucose reference range applies only to samples taken after fasting for at least 8 hours.  CBC     Status: Abnormal   Collection Time: 05/18/23  6:20 PM  Result Value Ref Range   WBC 10.5 4.0 - 10.5 K/uL  RBC 3.93 (L) 4.22 - 5.81 MIL/uL   Hemoglobin 13.2 13.0 - 17.0 g/dL   HCT 40.9 81.1 - 91.4 %   MCV 101.0 (H) 80.0 - 100.0 fL   MCH 33.6 26.0 - 34.0 pg   MCHC 33.2 30.0 - 36.0 g/dL   RDW 78.2 (H) 95.6 - 21.3 %   Platelets 123 (L) 150 - 400 K/uL   nRBC 0.3 (H) 0.0 - 0.2 %    Comment: Performed at [facility], [address] [place], [place] [address]  Comprehensive metabolic panel     Status: Abnormal   Collection Time: 05/18/23  6:20 PM  Result Value Ref Range   Sodium 134 (L) 135 - 145 mmol/L   Potassium 4.5 3.5 - 5.1 mmol/L   Chloride 93 (L) 98 - 111 mmol/L   CO2 26 22 - 32 mmol/L   Glucose, Bld 198 (H) 70 - 99 mg/dL    Comment: Glucose reference range applies only to samples taken after fasting for at least 8 hours.   BUN 27 (H) 8 - 23 mg/dL   Creatinine, Ser 8.46 (H) 0.61 - 1.24 mg/dL   Calcium 9.0 8.9 - 96.2 mg/dL   Total Protein 6.2 (L) 6.5 - 8.1 g/dL   Albumin 3.0 (L) 3.5 - 5.0 g/dL   AST 57 (H) 15 - 41 U/L   ALT 93 (H) 0 - 44 U/L   Alkaline Phosphatase 197 (H) 38 - 126 U/L   Total Bilirubin 2.0 (H) <1.2 mg/dL   GFR, Estimated 26 (L) >60 mL/min    Comment: (NOTE) Calculated using the CKD-EPI Creatinine Equation (2021)    Anion gap 15 5 - 15    Comment: Performed at [facility], [address] [place], [place] [address]  CBG monitoring, ED     Status: Abnormal   Collection Time: 05/18/23 10:12 PM  Result Value Ref Range    Glucose-Capillary 307 (H) 70 - 99 mg/dL    Comment: Glucose reference range applies only to samples taken after fasting for at least 8 hours.  Protime-INR     Status: Abnormal   Collection Time: 05/19/23 12:24 AM  Result Value Ref Range   Prothrombin Time 15.7 (H) 11.4 - 15.2 seconds   INR 1.2 0.8 - 1.2    Comment: (NOTE) INR goal varies based on device and disease states. Performed at [facility], [address], [place], [place] [address]   Folate     Status: None   Collection Time: 05/19/23 12:24 AM  Result Value Ref Range   Folate 11.5 >5.9 ng/mL    Comment: Performed at [facility], [address] [place], [place] [address]  Iron and TIBC     Status: Abnormal   Collection Time: 05/19/23 12:24 AM  Result Value Ref Range   Iron 155 45 - 182 ug/dL   TIBC 253 (L) 664 - 403 ug/dL   Saturation Ratios 65 (H) 17.9 - 39.5 %   UIBC 83 ug/dL    Comment: Performed at [facility], [address] [place], [place] [address]  Ferritin     Status: Abnormal   Collection Time: 05/19/23 12:24 AM  Result Value Ref Range   Ferritin 1,620 (H) 24 - 336 ng/mL    Comment: Performed at [facility], [address], [place], [place] [address]  Reticulocytes     Status: Abnormal   Collection Time: 05/19/23 12:24 AM  Result Value  Ref Range   Retic Ct Pct 2.3 0.4 - 3.1 %   RBC. 3.36 (L) 4.22 - 5.81 MIL/uL   Retic Count, Absolute 78.3 19.0 - 186.0 K/uL   Immature Retic Fract 19.2 (H) 2.3 - 15.9 %    Comment: Performed at [facility], [address] [place], [place] [address]  TSH     Status: None   Collection Time: 05/19/23 12:24 AM  Result Value Ref Range   TSH 1.442 0.350 - 4.500 uIU/mL    Comment: Performed by a 3rd Generation assay with a functional sensitivity of <=0.01 uIU/mL. Performed at [facility], [address] [place], [place] [address]   Troponin I (High Sensitivity)     Status: Abnormal   Collection Time: 05/19/23 12:24 AM  Result Value  Ref Range   Troponin I (High Sensitivity) 559 (HH) <18 ng/L    Comment: CRITICAL RESULT CALLED TO, READ BACK BY AND VERIFIED WITH M ROBERTS EMT P 05/19/2023 0127 BNUNNERY (NOTE) Elevated high sensitivity troponin I (hsTnI) values and significant  changes across serial measurements may suggest ACS but many other  chronic and acute conditions are known to elevate hsTnI results.  Refer to the "Links" section for chest pain algorithms and additional  guidance. Performed at [facility], [address], [place], [place] [address]   Prealbumin     Status: None   Collection Time: 05/19/23 12:24 AM  Result Value Ref Range   Prealbumin 20 18 - 38 mg/dL    Comment: Performed at [facility], [address] [place], [place] [address]  Magnesium     Status: None   Collection Time: 05/19/23 12:24 AM  Result Value Ref Range   Magnesium 2.0 1.7 - 2.4 mg/dL    Comment: Performed at [facility], [address], [place], [place] [address]  Phosphorus     Status: Abnormal   Collection Time: 05/19/23 12:24 AM  Result Value Ref Range   Phosphorus 6.2 (H) 2.5 - 4.6 mg/dL    Comment: Performed at [facility], [address], [place], [place] [address]  Lactic acid, plasma     Status: Abnormal   Collection Time: 05/19/23 12:24 AM  Result Value Ref Range   Lactic Acid, Venous 2.8 (HH) 0.5 - 1.9 mmol/L    Comment: CRITICAL RESULT CALLED TO, READ BACK BY AND VERIFIED WITH M ROBERTS EMT P 05/19/2023 0056 BNUNNERY Performed at [facility], [address], [place], [place] [address]   Brain natriuretic peptide     Status: Abnormal   Collection Time: 05/19/23 12:24 AM  Result Value Ref Range   B Natriuretic Peptide 2,343.1 (H) 0.0 - 100.0 pg/mL    Comment: Performed at [facility], [address], [place], [place] [address]  CBG monitoring, ED     Status: Abnormal   Collection Time: 05/19/23 12:54 AM  Result Value Ref Range   Glucose-Capillary 239 (H) 70 - 99 mg/dL     Comment: Glucose reference range applies only to samples taken after fasting for at least 8 hours.  CBG monitoring, ED     Status: Abnormal   Collection Time: 05/19/23  3:59 AM  Result Value Ref Range   Glucose-Capillary 298 (H) 70 - 99 mg/dL    Comment: Glucose reference range applies only to samples taken after fasting for at least 8 hours.  Lactic acid, plasma     Status: Abnormal  Collection Time: 05/19/23  4:21 AM  Result Value Ref Range   Lactic Acid, Venous 2.8 (HH) 0.5 - 1.9 mmol/L    Comment: CRITICAL RESULT CALLED TO, READ BACK BY AND VERIFIED WITH Alinda Deem RN [phone] M. [facility] Performed at [facility] Lab, [address], [place], [place] [address]   CBG monitoring, ED     Status: Abnormal   Collection Time: 05/19/23  7:28 AM  Result Value Ref Range   Glucose-Capillary 203 (H) 70 - 99 mg/dL    Comment: Glucose reference range applies only to samples taken after fasting for at least 8 hours.  CBC     Status: Abnormal   Collection Time: 05/19/23  8:48 AM  Result Value Ref Range   WBC 11.4 (H) 4.0 - 10.5 K/uL   RBC 3.39 (L) 4.22 - 5.81 MIL/uL   Hemoglobin 11.8 (L) 13.0 - 17.0 g/dL   HCT 91.4 (L) 78.2 - 95.6 %   MCV 103.5 (H) 80.0 - 100.0 fL   MCH 34.8 (H) 26.0 - 34.0 pg   MCHC 33.6 30.0 - 36.0 g/dL   RDW 21.3 (H) 08.6 - 57.8 %   Platelets 121 (L) 150 - 400 K/uL   nRBC 0.5 (H) 0.0 - 0.2 %    Comment: Performed at [facility], [address] [place], [place] [address]    CT Angio Chest Pulmonary Embolism (PE) W or WO Contrast  Result Date: 05/18/2023 CLINICAL DATA:  Hypotensive following dialysis treatment, initial encounter EXAM: CT ANGIOGRAPHY CHEST WITH CONTRAST TECHNIQUE: Multidetector CT imaging of the chest was performed using the standard protocol during bolus administration of intravenous contrast. Multiplanar CT image reconstructions and MIPs were obtained to evaluate the vascular anatomy. RADIATION DOSE REDUCTION: This exam was performed  according to the departmental dose-optimization program which includes automated exposure control, adjustment of the mA and/or kV according to patient size and/or use of iterative reconstruction technique. CONTRAST:  75mL OMNIPAQUE IOHEXOL 350 MG/ML SOLN COMPARISON:  Chest x-ray from earlier in the same day. FINDINGS: Cardiovascular: Atherosclerotic calcifications of the thoracic aorta are noted. No aneurysmal dilatation is seen. Cardiac shadow is enlarged. Coronary calcifications are noted. The pulmonary artery shows a normal branching pattern bilaterally. No filling defects to suggest pulmonary emboli are seen. Mediastinum/Nodes: Thoracic inlet is within normal limits. No hilar or mediastinal adenopathy is noted. The esophagus as visualized is within normal limits. Lungs/Pleura: Small bilateral pleural effusions right greater than left are seen. Mild bilateral atelectatic changes are noted. Upper Abdomen: Visualized upper abdomen demonstrates mild ascites. Kidneys are shrunken consistent with the known history of end-stage renal disease. Musculoskeletal: Degenerative changes of the thoracic spine are noted stable from the prior exam. Review of the MIP images confirms the above findings. IMPRESSION: No evidence of pulmonary emboli. Bilateral small effusions with associated atelectasis right greater than left. No other acute abnormality is noted. Aortic Atherosclerosis (ICD10-I70.0). Electronically Signed   By: Alcide Clever M.D.   On: 05/18/2023 23:56   US Abdomen Limited RUQ (LIVER/GB)  Result Date: 05/18/2023 CLINICAL DATA:  Elevated liver function tests EXAM: ULTRASOUND ABDOMEN LIMITED RIGHT UPPER QUADRANT COMPARISON:  11/15/2017 FINDINGS: Gallbladder: Prior cholecystectomy. Common bile duct: Diameter: 5 mm Liver: Diffuse increased liver echotexture consistent with hepatic steatosis. No focal liver abnormality. Portal vein is patent on color Doppler imaging with normal direction of blood flow towards the  liver. Other: Incidental right pleural effusion. Trace right upper quadrant ascites. IMPRESSION: 1. Increased liver echotexture consistent with  hepatic steatosis. 2. Prior cholecystectomy. 3. Trace right upper quadrant ascites. 4. Incidental right pleural effusion. Electronically Signed   By: Sharlet Salina M.D.   On: 05/18/2023 23:11   DG Chest Portable 1 View  Result Date: 05/18/2023 CLINICAL DATA:  Hypotension, tachycardia, atrial fibrillation EXAM: PORTABLE CHEST 1 VIEW COMPARISON:  03/25/2023 FINDINGS: Two frontal views of the chest demonstrate an enlarged cardiac silhouette. Chronic central vascular prominence, without acute airspace disease, effusion, or pneumothorax. Linear consolidation at the left lung base likely reflects subsegmental atelectasis. No acute bony abnormalities. IMPRESSION: 1. Enlarged cardiac silhouette, with chronic central vascular congestion. No overt edema. Electronically Signed   By: Sharlet Salina M.D.   On: 05/18/2023 22:22   \  HD orders:  NW MWF 400/auto 1.5 3K/2.5Ca EDW 68.1 4h Calcitriol 1.25 three times per week No ESA  Assessment/PlanLarry G Lucas is an 82 y.o. male with ESRD on HD MWF, HTN, DM, CAD, combined CHF (2023 grade 1 DD and EF 45%) currently hospitalized with new A fib complicated by hypotension and nephrology is consulted for evaluation and management of ESRD.   **A fib with RVR complicated by hypotension:  cardiology consulting - dilt overnight limited by low BPs;  IV amio bolus this AM. On heparin gtt. TTE pending.  CTA neg PE.    **ESRD on HD MWF:  ran full treatment 05/18/23.  Hopefully will stabilize A fib/BP by next treatment.  Cardiology mentioned to pt concern for volume playing role --> I don't see need for emergent UF today but if that become necessary and BP remains low he'd need ICU and pressor support to facilitate.  Daily labs.  Strict I/Os.  Daily weights.  Dose meds for ESRD.   **HTN: at baseline, now with hypotension in setting  of a fib, hold meds.   **Anemia of CKD:  Hb in 11s, no ESA needed outpt  **BMM: cont outpt calcitriol and binder doses. Renal diet.   Will follow, call with concerns.  Tyler Pita 05/19/2023, 9:23 AM

## 2023-05-19 NOTE — ED Notes (Signed)
Assisted patient in getting set up to eat lunch

## 2023-05-19 NOTE — Plan of Care (Signed)
Patient progressing on all care plans. New admission to unit today. Continuing to remain on heparin and amiodarone gtt. BP remains WNL. Patient remains in Afib w/ RVR w/ HR 100-120.

## 2023-05-19 NOTE — ED Notes (Signed)
On Heparin drip

## 2023-05-19 NOTE — ED Notes (Signed)
ED TO INPATIENT HANDOFF REPORT  ED Nurse Name and Phone #: Grover Canavan [phone]  S Name/Age/Gender Robert Lucas 82 y.o. male Room/Bed: 005C/005C  Code Status   Code Status: Full Code  Home/SNF/Other Home Patient oriented to: self, place, time, and situation Is this baseline? Yes   Triage Complete: Triage complete  Chief Complaint Atrial fibrillation with RVR (HCC) [I48.91]  Triage Note Finished dialysis. Pt became hypotensive at 60/30. 911 was called. HR was at 155 bpm. EMS reports bp was 100/92. Denies pain or any discomfort. Alert and oriented x 4.    Allergies Allergies  Allergen Reactions   Zestril [Lisinopril] Cough   Hydrocodone Nausea Only   Januvia [Sitagliptin] Other (See Comments)    Unknown   Levitra [Vardenafil] Other (See Comments)    flushing   Prednisone Other (See Comments)    Elevates BGL; patient prefers to not take this   Victoza [Liraglutide] Other (See Comments)    GI upset   Penicillins Rash and Other (See Comments)    Rash around ankles Has patient had a PCN reaction causing immediate rash, facial/tongue/throat swelling, SOB or lightheadedness with hypotension: Yes Has patient had a PCN reaction causing severe rash involving mucus membranes or skin necrosis: Unk Has patient had a PCN reaction that required hospitalization: No Has patient had a PCN reaction occurring within the last 10 years: No If all of the above answers are "NO", then may proceed with Cephalosporin use.     Level of Care/Admitting Diagnosis ED Disposition     ED Disposition  Admit   Condition  --   Comment  Hospital Area: [facility] [[id number]]  Level of Care: Progressive [102]  Admit to Progressive based on following criteria: CARDIOVASCULAR & THORACIC of moderate stability with acute coronary syndrome symptoms/low risk myocardial infarction/hypertensive urgency/arrhythmias/heart failure potentially compromising stability and stable post cardiovascular  intervention patients.  May place patient in observation at [facility] or [facility] if equivalent level of care is available:: No  Covid Evaluation: Asymptomatic - no recent exposure (last 10 days) testing not required  Diagnosis: Atrial fibrillation with RVR [facility]) [[id number]]  Admitting Physician: Therisa Doyne [[id number]]  Attending Physician: Therisa Doyne [[id number]]          B Medical/Surgery History Past Medical History:  Diagnosis Date   Arthritis    CHF (congestive heart failure) (HCC)    HFrEF   CKD (chronic kidney disease)    sees [place] in [place]   Coronary artery disease    Diabetes (HCC)    type 2   sees endo at [place] in [place]   ESRD (end stage renal disease) on dialysis [facility])    M,W,F   Hypercholesteremia    Hypertension    Myocardial infarction (HCC) 1990   Neuromuscular disorder (HCC)    Pneumonia    PONV (postoperative nausea and vomiting)    Past Surgical History:  Procedure Laterality Date   AV FISTULA PLACEMENT Left 05/17/2021   Procedure: LEFT ARM ARTERIOVENOUS (AV) FISTULA CREATION;  Surgeon: Victorino Sparrow, MD;  Location: [facility] OR;  Service: Vascular;  Laterality: Left;  PERIPHERAL NERVE BLOCK   AV FISTULA PLACEMENT Left 05/02/2022   Procedure: INSERTION OF LEFT ARTERIOVENOUS (AV) GORE-TEX GRAFT;  Surgeon: Victorino Sparrow, MD;  Location: [facility] OR;  Service: Vascular;  Laterality: Left;   BACK SURGERY  yrs ago   lower   CARDIAC CATHETERIZATION     CATARACT EXTRACTION Right 01/2021   CHOLECYSTECTOMY N/A 05/02/2017  Procedure: LAPAROSCOPIC CHOLECYSTECTOMY;  Surgeon: Axel Filler, MD;  Location: [facility] OR;  Service: General;  Laterality: N/A;   CORONARY ANGIOPLASTY     2 1990   EYE SURGERY Left 03/2021   cataract removal   IR FLUORO GUIDE CV LINE RIGHT  10/15/2021   IR US GUIDE VASC ACCESS RIGHT  10/15/2021   KNEE SURGERY Left yrs ago   arthroscopic   LIGATION OF COMPETING BRANCHES OF ARTERIOVENOUS FISTULA Left 11/22/2021   Procedure: LEFT  ARM FISTULA BRANCH LIGATION;  Surgeon: Victorino Sparrow, MD;  Location: [facility] OR;  Service: Vascular;  Laterality: Left;   PERIPHERAL VASCULAR BALLOON ANGIOPLASTY  10/19/2021   Procedure: PERIPHERAL VASCULAR BALLOON ANGIOPLASTY;  Surgeon: Victorino Sparrow, MD;  Location: [facility] INVASIVE CV LAB;  Service: Cardiovascular;;  Left AVF   ROTATOR CUFF REPAIR Left yrs ago   SHOULDER OPEN ROTATOR CUFF REPAIR Right 04/23/2013   Procedure: RIGHT SHOULDER ROTATOR CUFF REPAIR WITH GRAFT AND ANCHORS ;  Surgeon: Jacki Cones, MD;  Location: [facility] ORS;  Service: Orthopedics;  Laterality: Right;   TONSILLECTOMY     TOTAL HIP ARTHROPLASTY Right 05/06/2019   Procedure: TOTAL HIP ARTHROPLASTY ANTERIOR APPROACH;  Surgeon: Durene Romans, MD;  Location: [facility] ORS;  Service: Orthopedics;  Laterality: Right;  70 mins     A IV Location/Drains/Wounds Patient Lines/Drains/Airways Status     Active Line/Drains/Airways     Name Placement date Placement time Site Days   Peripheral IV 05/18/23 20 G 1.88" Anterior;Right;Upper Arm 05/18/23  1805  Arm  1   Peripheral IV 05/19/23 20 G 1" Right;Lateral Forearm 05/19/23  0647  Forearm  less than 1   Fistula / Graft Left Forearm Arteriovenous fistula 05/17/21  0825  Forearm  732   Fistula / Graft Left Upper arm Arteriovenous vein graft 05/02/22  1001  Upper arm  382            Intake/Output Last 24 hours No intake or output data in the 24 hours ending 05/19/23 1152  Labs/Imaging Results for orders placed or performed during the hospital encounter of 05/18/23 (from the past 48 hour(s))  CBG monitoring, ED     Status: Abnormal   Collection Time: 05/18/23  5:00 PM  Result Value Ref Range   Glucose-Capillary 185 (H) 70 - 99 mg/dL    Comment: Glucose reference range applies only to samples taken after fasting for at least 8 hours.  CBG monitoring, ED     Status: Abnormal   Collection Time: 05/18/23  5:39 PM  Result Value Ref Range   Glucose-Capillary 180 (H) 70 - 99 mg/dL     Comment: Glucose reference range applies only to samples taken after fasting for at least 8 hours.  CBC     Status: Abnormal   Collection Time: 05/18/23  6:20 PM  Result Value Ref Range   WBC 10.5 4.0 - 10.5 K/uL   RBC 3.93 (L) 4.22 - 5.81 MIL/uL   Hemoglobin 13.2 13.0 - 17.0 g/dL   HCT 19.1 47.8 - 29.5 %   MCV 101.0 (H) 80.0 - 100.0 fL   MCH 33.6 26.0 - 34.0 pg   MCHC 33.2 30.0 - 36.0 g/dL   RDW 62.1 (H) 30.8 - 65.7 %   Platelets 123 (L) 150 - 400 K/uL   nRBC 0.3 (H) 0.0 - 0.2 %    Comment: Performed at [facility] Lab, [address] [place], [place] [address]  Comprehensive metabolic panel     Status:  Abnormal   Collection Time: 05/18/23  6:20 PM  Result Value Ref Range   Sodium 134 (L) 135 - 145 mmol/L   Potassium 4.5 3.5 - 5.1 mmol/L   Chloride 93 (L) 98 - 111 mmol/L   CO2 26 22 - 32 mmol/L   Glucose, Bld 198 (H) 70 - 99 mg/dL    Comment: Glucose reference range applies only to samples taken after fasting for at least 8 hours.   BUN 27 (H) 8 - 23 mg/dL   Creatinine, Ser 7.84 (H) 0.61 - 1.24 mg/dL   Calcium 9.0 8.9 - 69.6 mg/dL   Total Protein 6.2 (L) 6.5 - 8.1 g/dL   Albumin 3.0 (L) 3.5 - 5.0 g/dL   AST 57 (H) 15 - 41 U/L   ALT 93 (H) 0 - 44 U/L   Alkaline Phosphatase 197 (H) 38 - 126 U/L   Total Bilirubin 2.0 (H) <1.2 mg/dL   GFR, Estimated 26 (L) >60 mL/min    Comment: (NOTE) Calculated using the CKD-EPI Creatinine Equation (2021)    Anion gap 15 5 - 15    Comment: Performed at [facility], [address], [place], [place] [address]  CBG monitoring, ED     Status: Abnormal   Collection Time: 05/18/23 10:12 PM  Result Value Ref Range   Glucose-Capillary 307 (H) 70 - 99 mg/dL    Comment: Glucose reference range applies only to samples taken after fasting for at least 8 hours.  Protime-INR     Status: Abnormal   Collection Time: 05/19/23 12:24 AM  Result Value Ref Range   Prothrombin Time 15.7 (H) 11.4 - 15.2 seconds   INR 1.2 0.8 - 1.2    Comment:  (NOTE) INR goal varies based on device and disease states. Performed at [facility], [address], [place], [place] [address]   Folate     Status: None   Collection Time: 05/19/23 12:24 AM  Result Value Ref Range   Folate 11.5 >5.9 ng/mL    Comment: Performed at [facility], [address], [place], [place] [address]  Iron and TIBC     Status: Abnormal   Collection Time: 05/19/23 12:24 AM  Result Value Ref Range   Iron 155 45 - 182 ug/dL   TIBC 725 (L) 366 - 440 ug/dL   Saturation Ratios 65 (H) 17.9 - 39.5 %   UIBC 83 ug/dL    Comment: Performed at [facility] Lab, [address], [place], [place] [address]  Ferritin     Status: Abnormal   Collection Time: 05/19/23 12:24 AM  Result Value Ref Range   Ferritin 1,620 (H) 24 - 336 ng/mL    Comment: Performed at [facility], [address] [place], [place] [address]  Reticulocytes     Status: Abnormal   Collection Time: 05/19/23 12:24 AM  Result Value Ref Range   Retic Ct Pct 2.3 0.4 - 3.1 %   RBC. 3.36 (L) 4.22 - 5.81 MIL/uL   Retic Count, Absolute 78.3 19.0 - 186.0 K/uL   Immature Retic Fract 19.2 (H) 2.3 - 15.9 %    Comment: Performed at [facility], [address] [place], [place] [address]  TSH     Status: None   Collection Time: 05/19/23 12:24 AM  Result Value Ref Range   TSH 1.442 0.350 - 4.500 uIU/mL    Comment: Performed by a 3rd Generation assay with a functional sensitivity  of <=0.01 uIU/mL. Performed at [facility], [address] [place], [place] [address]   Troponin I (High Sensitivity)     Status: Abnormal   Collection Time: 05/19/23 12:24 AM  Result Value Ref Range   Troponin I (High Sensitivity) 559 (HH) <18 ng/L    Comment: CRITICAL RESULT CALLED TO, READ BACK BY AND VERIFIED WITH M ROBERTS EMT P 05/19/2023 0127 BNUNNERY (NOTE) Elevated high sensitivity troponin I (hsTnI) values and significant  changes across serial measurements may suggest ACS but many other   chronic and acute conditions are known to elevate hsTnI results.  Refer to the "Links" section for chest pain algorithms and additional  guidance. Performed at [facility], [address] [place], [place] [address]   Prealbumin     Status: None   Collection Time: 05/19/23 12:24 AM  Result Value Ref Range   Prealbumin 20 18 - 38 mg/dL    Comment: Performed at [facility], [address] [place], [place] [address]  Magnesium     Status: None   Collection Time: 05/19/23 12:24 AM  Result Value Ref Range   Magnesium 2.0 1.7 - 2.4 mg/dL    Comment: Performed at [facility], [address], [place], [place] [address]  Phosphorus     Status: Abnormal   Collection Time: 05/19/23 12:24 AM  Result Value Ref Range   Phosphorus 6.2 (H) 2.5 - 4.6 mg/dL    Comment: Performed at [facility], [address] [place], [place] [address]  Lactic acid, plasma     Status: Abnormal   Collection Time: 05/19/23 12:24 AM  Result Value Ref Range   Lactic Acid, Venous 2.8 (HH) 0.5 - 1.9 mmol/L    Comment: CRITICAL RESULT CALLED TO, READ BACK BY AND VERIFIED WITH M ROBERTS EMT P 05/19/2023 0056 BNUNNERY Performed at [facility] Lab, [address], [place], [place] [address]   Brain natriuretic peptide     Status: Abnormal   Collection Time: 05/19/23 12:24 AM  Result Value Ref Range   B Natriuretic Peptide 2,343.1 (H) 0.0 - 100.0 pg/mL    Comment: Performed at [facility] Lab, [address] [place], [place] [address]  CBG monitoring, ED     Status: Abnormal   Collection Time: 05/19/23 12:54 AM  Result Value Ref Range   Glucose-Capillary 239 (H) 70 - 99 mg/dL    Comment: Glucose reference range applies only to samples taken after fasting for at least 8 hours.  CBG monitoring, ED     Status: Abnormal   Collection Time: 05/19/23  3:59 AM  Result Value Ref Range   Glucose-Capillary 298 (H) 70 - 99 mg/dL    Comment: Glucose reference range applies only to samples taken  after fasting for at least 8 hours.  Lactic acid, plasma     Status: Abnormal   Collection Time: 05/19/23  4:21 AM  Result Value Ref Range   Lactic Acid, Venous 2.8 (HH) 0.5 - 1.9 mmol/L    Comment: CRITICAL RESULT CALLED TO, READ BACK BY AND VERIFIED WITH Alinda Deem RN [phone] M. [facility] Performed at [facility] Lab, [address] [place], [place] [address]   CBG monitoring, ED     Status: Abnormal   Collection Time: 05/19/23  7:28 AM  Result Value Ref Range   Glucose-Capillary 203 (H) 70 - 99 mg/dL    Comment: Glucose reference range applies only to samples  taken after fasting for at least 8 hours.  Magnesium     Status: None   Collection Time: 05/19/23  8:48 AM  Result Value Ref Range   Magnesium 2.1 1.7 - 2.4 mg/dL    Comment: Performed at [facility], [address], [place], [place] [address]  Phosphorus     Status: Abnormal   Collection Time: 05/19/23  8:48 AM  Result Value Ref Range   Phosphorus 6.5 (H) 2.5 - 4.6 mg/dL    Comment: Performed at [facility] Lab, [address], [place], [place] [address]  Comprehensive metabolic panel     Status: Abnormal   Collection Time: 05/19/23  8:48 AM  Result Value Ref Range   Sodium 132 (L) 135 - 145 mmol/L   Potassium 5.1 3.5 - 5.1 mmol/L    Comment: HEMOLYSIS AT THIS LEVEL MAY AFFECT RESULT   Chloride 95 (L) 98 - 111 mmol/L   CO2 22 22 - 32 mmol/L   Glucose, Bld 224 (H) 70 - 99 mg/dL    Comment: Glucose reference range applies only to samples taken after fasting for at least 8 hours.   BUN 39 (H) 8 - 23 mg/dL   Creatinine, Ser 0.98 (H) 0.61 - 1.24 mg/dL   Calcium 8.5 (L) 8.9 - 10.3 mg/dL   Total Protein 5.4 (L) 6.5 - 8.1 g/dL   Albumin 2.5 (L) 3.5 - 5.0 g/dL   AST 51 (H) 15 - 41 U/L    Comment: HEMOLYSIS AT THIS LEVEL MAY AFFECT RESULT   ALT 78 (H) 0 - 44 U/L    Comment: HEMOLYSIS AT THIS LEVEL MAY AFFECT RESULT   Alkaline Phosphatase 157 (H) 38 - 126 U/L   Total Bilirubin 1.9 (H) <1.2 mg/dL    Comment:  HEMOLYSIS AT THIS LEVEL MAY AFFECT RESULT   GFR, Estimated 18 (L) >60 mL/min    Comment: (NOTE) Calculated using the CKD-EPI Creatinine Equation (2021)    Anion gap 15 5 - 15    Comment: Performed at [facility], [address], [place], [place] [address]  CBC     Status: Abnormal   Collection Time: 05/19/23  8:48 AM  Result Value Ref Range   WBC 11.4 (H) 4.0 - 10.5 K/uL   RBC 3.39 (L) 4.22 - 5.81 MIL/uL   Hemoglobin 11.8 (L) 13.0 - 17.0 g/dL   HCT 78.2 (L) 95.6 - 21.3 %   MCV 103.5 (H) 80.0 - 100.0 fL   MCH 34.8 (H) 26.0 - 34.0 pg   MCHC 33.6 30.0 - 36.0 g/dL   RDW 08.6 (H) 57.8 - 46.9 %   Platelets 121 (L) 150 - 400 K/uL   nRBC 0.5 (H) 0.0 - 0.2 %    Comment: Performed at [facility], [address], [place], [place] [address]  Vitamin B12     Status: None   Collection Time: 05/19/23 10:15 AM  Result Value Ref Range   Vitamin B-12 729 180 - 914 pg/mL    Comment: (NOTE) This assay is not validated for testing neonatal or myeloproliferative syndrome specimens for Vitamin B12 levels. Performed at [facility], [address], [place], [place] [address]   Troponin I (High Sensitivity)     Status: Abnormal   Collection Time: 05/19/23 10:15 AM  Result Value Ref Range   Troponin I (High Sensitivity) 569 (HH) <18 ng/L    Comment: CRITICAL VALUE NOTED. VALUE IS CONSISTENT WITH PREVIOUSLY REPORTED/CALLED VALUE (  NOTE) Elevated high sensitivity troponin I (hsTnI) values and significant  changes across serial measurements may suggest ACS but many other  chronic and acute conditions are known to elevate hsTnI results.  Refer to the "Links" section for chest pain algorithms and additional  guidance. Performed at [facility], [address], [place], [place] [address]   Heparin level (unfractionated)     Status: None   Collection Time: 05/19/23 10:15 AM  Result Value Ref Range   Heparin Unfractionated 0.70 0.30 - 0.70 IU/mL    Comment: (NOTE) The  clinical reportable range upper limit is being lowered to >1.10 to align with the FDA approved guidance for the current laboratory assay.  If heparin results are below expected values, and patient dosage has  been confirmed, suggest follow up testing of antithrombin III levels. Performed at [facility], [address] [place] [address], [place], [place] [address]   CBG monitoring, ED     Status: Abnormal   Collection Time: 05/19/23 11:36 AM  Result Value Ref Range   Glucose-Capillary 226 (H) 70 - 99 mg/dL    Comment: Glucose reference range applies only to samples taken after fasting for at least 8 hours.   CT Angio Chest Pulmonary Embolism (PE) W or WO Contrast  Result Date: 05/18/2023 CLINICAL DATA:  Hypotensive following dialysis treatment, initial encounter EXAM: CT ANGIOGRAPHY CHEST WITH CONTRAST TECHNIQUE: Multidetector CT imaging of the chest was performed using the standard protocol during bolus administration of intravenous contrast. Multiplanar CT image reconstructions and MIPs were obtained to evaluate the vascular anatomy. RADIATION DOSE REDUCTION: This exam was performed according to the departmental dose-optimization program which includes automated exposure control, adjustment of the mA and/or kV according to patient size and/or use of iterative reconstruction technique. CONTRAST:  75mL OMNIPAQUE IOHEXOL 350 MG/ML SOLN COMPARISON:  Chest x-ray from earlier in the same day. FINDINGS: Cardiovascular: Atherosclerotic calcifications of the thoracic aorta are noted. No aneurysmal dilatation is seen. Cardiac shadow is enlarged. Coronary calcifications are noted. The pulmonary artery shows a normal branching pattern bilaterally. No filling defects to suggest pulmonary emboli are seen. Mediastinum/Nodes: Thoracic inlet is within normal limits. No hilar or mediastinal adenopathy is noted. The esophagus as visualized is within normal limits. Lungs/Pleura: Small bilateral pleural effusions right greater  than left are seen. Mild bilateral atelectatic changes are noted. Upper Abdomen: Visualized upper abdomen demonstrates mild ascites. Kidneys are shrunken consistent with the known history of end-stage renal disease. Musculoskeletal: Degenerative changes of the thoracic spine are noted stable from the prior exam. Review of the MIP images confirms the above findings. IMPRESSION: No evidence of pulmonary emboli. Bilateral small effusions with associated atelectasis right greater than left. No other acute abnormality is noted. Aortic Atherosclerosis (ICD10-I70.0). Electronically Signed   By: Alcide Clever M.D.   On: 05/18/2023 23:56   US Abdomen Limited RUQ (LIVER/GB)  Result Date: 05/18/2023 CLINICAL DATA:  Elevated liver function tests EXAM: ULTRASOUND ABDOMEN LIMITED RIGHT UPPER QUADRANT COMPARISON:  11/15/2017 FINDINGS: Gallbladder: Prior cholecystectomy. Common bile duct: Diameter: 5 mm Liver: Diffuse increased liver echotexture consistent with hepatic steatosis. No focal liver abnormality. Portal vein is patent on color Doppler imaging with normal direction of blood flow towards the liver. Other: Incidental right pleural effusion. Trace right upper quadrant ascites. IMPRESSION: 1. Increased liver echotexture consistent with hepatic steatosis. 2. Prior cholecystectomy. 3. Trace right upper quadrant ascites. 4. Incidental right pleural effusion. Electronically Signed   By: Sharlet Salina M.D.   On: 05/18/2023 23:11  DG Chest Portable 1 View  Result Date: 05/18/2023 CLINICAL DATA:  Hypotension, tachycardia, atrial fibrillation EXAM: PORTABLE CHEST 1 VIEW COMPARISON:  03/25/2023 FINDINGS: Two frontal views of the chest demonstrate an enlarged cardiac silhouette. Chronic central vascular prominence, without acute airspace disease, effusion, or pneumothorax. Linear consolidation at the left lung base likely reflects subsegmental atelectasis. No acute bony abnormalities. IMPRESSION: 1. Enlarged cardiac  silhouette, with chronic central vascular congestion. No overt edema. Electronically Signed   By: Sharlet Salina M.D.   On: 05/18/2023 22:22    Pending Labs Unresulted Labs (From admission, onward)     Start     Ordered   05/20/23 0500  Heparin level (unfractionated)  Daily,   R      05/19/23 0152   05/20/23 0500  CBC  Daily,   R      05/19/23 0152            Vitals/Pain Today's Vitals   05/19/23 0640 05/19/23 0930 05/19/23 1036 05/19/23 1140  BP: 93/78 (!) 93/56  (!) 132/91  Pulse: (!) 117 (!) 104  (!) 116  Resp: 18 (!) 23  17  Temp: 98.4 F (36.9 C)  (!) 97.4 F (36.3 C)   TempSrc:   Axillary   SpO2: 94% 91%  94%  PainSc: 0-No pain       Isolation Precautions No active isolations  Medications Medications  insulin aspart (novoLOG) injection 0-6 Units (2 Units Subcutaneous Given 05/19/23 1141)  allopurinol (ZYLOPRIM) tablet 100 mg (100 mg Oral Given 05/19/23 0927)  atorvastatin (LIPITOR) tablet 40 mg (has no administration in time range)  gabapentin (NEURONTIN) capsule 300 mg (has no administration in time range)  tamsulosin (FLOMAX) capsule 0.4 mg (has no administration in time range)  acetaminophen (TYLENOL) tablet 650 mg (has no administration in time range)    Or  acetaminophen (TYLENOL) suppository 650 mg (has no administration in time range)  ondansetron (ZOFRAN) tablet 4 mg (has no administration in time range)    Or  ondansetron (ZOFRAN) injection 4 mg (has no administration in time range)  sodium chloride flush (NS) 0.9 % injection 3 mL (0 mLs Intravenous Hold 05/19/23 0925)  sodium chloride flush (NS) 0.9 % injection 3 mL (has no administration in time range)  0.9 %  sodium chloride infusion (has no administration in time range)  hydrocortisone sodium succinate (SOLU-CORTEF) 100 MG injection 100 mg (100 mg Intravenous Given 05/19/23 0550)  midodrine (PROAMATINE) tablet 5 mg (5 mg Oral Given 05/19/23 1139)  insulin NPH Human (NOVOLIN N) injection 10 Units  (10 Units Subcutaneous Given 05/19/23 0818)  heparin ADULT infusion 100 units/mL (25000 units/234mL) (950 Units/hr Intravenous Rate/Dose Change 05/19/23 1143)  amiodarone (NEXTERONE PREMIX) 360-4.14 MG/200ML-% (1.8 mg/mL) IV infusion (0 mg/hr Intravenous Stopped 05/19/23 1142)  amiodarone (NEXTERONE PREMIX) 360-4.14 MG/200ML-% (1.8 mg/mL) IV infusion (30 mg/hr Intravenous New Bag/Given 05/19/23 1142)  diltiazem (CARDIZEM) injection 15 mg (15 mg Intravenous Given 05/18/23 1817)  diltiazem (CARDIZEM) 1 mg/mL load via infusion 15 mg (15 mg Intravenous Bolus from Bag 05/18/23 2151)  iohexol (OMNIPAQUE) 350 MG/ML injection 75 mL (75 mLs Intravenous Contrast Given 05/18/23 2339)  heparin bolus via infusion 3,500 Units (3,500 Units Intravenous Bolus from Bag 05/19/23 0219)    Mobility walks with person assist     Focused Assessments Cardiac Assessment Handoff:  Cardiac Rhythm: Atrial fibrillation Lab Results  Component Value Date   TROPONINI <0.30 10/13/2012   Lab Results  Component Value Date   DDIMER 0.70 (H) 10/13/2012  Does the Patient currently have chest pain? No    R Recommendations: See Admitting Provider Note  Report given to:   Additional Notes:  HD patient.

## 2023-05-19 NOTE — ED Notes (Signed)
Lactic 2.8 

## 2023-05-19 NOTE — ED Notes (Signed)
Date and time results received: 05/19/23 0515 (use smartphrase ".now" to insert current time)  Test: lactic Critical Value: 2.8  Name of Provider Notified: hospitalist  Orders Received? Or Actions Taken?:

## 2023-05-20 ENCOUNTER — Inpatient Hospital Stay (HOSPITAL_COMMUNITY): Payer: Medicare Other

## 2023-05-20 DIAGNOSIS — I4891 Unspecified atrial fibrillation: Secondary | ICD-10-CM | POA: Diagnosis not present

## 2023-05-20 DIAGNOSIS — I10 Essential (primary) hypertension: Secondary | ICD-10-CM | POA: Diagnosis not present

## 2023-05-20 DIAGNOSIS — E1142 Type 2 diabetes mellitus with diabetic polyneuropathy: Secondary | ICD-10-CM | POA: Diagnosis not present

## 2023-05-20 DIAGNOSIS — I5042 Chronic combined systolic (congestive) and diastolic (congestive) heart failure: Secondary | ICD-10-CM | POA: Diagnosis not present

## 2023-05-20 LAB — CBC
HCT: 36.4 % — ABNORMAL LOW (ref 39.0–52.0)
Hemoglobin: 12.3 g/dL — ABNORMAL LOW (ref 13.0–17.0)
MCH: 34.2 pg — ABNORMAL HIGH (ref 26.0–34.0)
MCHC: 33.8 g/dL (ref 30.0–36.0)
MCV: 101.1 fL — ABNORMAL HIGH (ref 80.0–100.0)
Platelets: 112 10*3/uL — ABNORMAL LOW (ref 150–400)
RBC: 3.6 MIL/uL — ABNORMAL LOW (ref 4.22–5.81)
RDW: 16.9 % — ABNORMAL HIGH (ref 11.5–15.5)
WBC: 15.1 10*3/uL — ABNORMAL HIGH (ref 4.0–10.5)
nRBC: 1.7 % — ABNORMAL HIGH (ref 0.0–0.2)

## 2023-05-20 LAB — GLUCOSE, CAPILLARY
Glucose-Capillary: 144 mg/dL — ABNORMAL HIGH (ref 70–99)
Glucose-Capillary: 137 mg/dL — ABNORMAL HIGH (ref 70–99)
Glucose-Capillary: 199 mg/dL — ABNORMAL HIGH (ref 70–99)
Glucose-Capillary: 303 mg/dL — ABNORMAL HIGH (ref 70–99)
Glucose-Capillary: 363 mg/dL — ABNORMAL HIGH (ref 70–99)

## 2023-05-20 LAB — BASIC METABOLIC PANEL
Anion gap: 15 (ref 5–15)
BUN: 52 mg/dL — ABNORMAL HIGH (ref 8–23)
CO2: 20 mmol/L — ABNORMAL LOW (ref 22–32)
Calcium: 8.9 mg/dL (ref 8.9–10.3)
Chloride: 95 mmol/L — ABNORMAL LOW (ref 98–111)
Creatinine, Ser: 4.06 mg/dL — ABNORMAL HIGH (ref 0.61–1.24)
GFR, Estimated: 14 mL/min — ABNORMAL LOW (ref >60)
Glucose, Bld: 163 mg/dL — ABNORMAL HIGH (ref 70–99)
Potassium: 4.6 mmol/L (ref 3.5–5.1)
Sodium: 130 mmol/L — ABNORMAL LOW (ref 135–145)

## 2023-05-20 LAB — HEPATITIS B SURFACE ANTIGEN: Hepatitis B Surface Ag: NONREACTIVE

## 2023-05-20 LAB — HEPARIN LEVEL (UNFRACTIONATED): Heparin Unfractionated: 0.5 [IU]/mL (ref 0.30–0.70)

## 2023-05-20 MED ORDER — MIDODRINE HCL 5 MG PO TABS
10.0000 mg | ORAL_TABLET | Freq: Three times a day (TID) | ORAL | Status: DC
  Administered 2023-05-20 – 2023-05-30 (×27): 10 mg via ORAL
  Filled 2023-05-20 (×30): qty 2

## 2023-05-20 MED ORDER — CHLORHEXIDINE GLUCONATE CLOTH 2 % EX PADS
6.0000 | MEDICATED_PAD | Freq: Every day | CUTANEOUS | Status: DC
  Administered 2023-05-20 – 2023-05-26 (×7): 6 via TOPICAL

## 2023-05-20 MED ORDER — APIXABAN 2.5 MG PO TABS
2.5000 mg | ORAL_TABLET | Freq: Two times a day (BID) | ORAL | Status: DC
  Administered 2023-05-20 – 2023-05-30 (×19): 2.5 mg via ORAL
  Filled 2023-05-20 (×20): qty 1

## 2023-05-20 NOTE — Consult Note (Signed)
WOC Nurse Consult Note: Reason for Consult: Left great toe abrasion in the setting of diabetes and ESRD, on dialysis Wound type: trauma Pressure Injury POA:NA Measurement: 1 cm round Wound bed: ruddy red Drainage (amount, consistency, odor) minimal serosanguinous Periwound: edema to lower legs and feet Dressing procedure/placement/frequency: cleanse left great toe with VASHE.  VASHE moist 2x2 to wound bed. Wrap with kerlix and tape.  Change daily.    Will not follow at this time.  Please re-consult if needed.  Mike Gip MSN, RN, FNP-BC CWON Wound, Ostomy, Continence Nurse [facility]patient [facility] [phone] Pager [phone]

## 2023-05-20 NOTE — Progress Notes (Addendum)
ANTICOAGULATION CONSULT NOTE  Pharmacy Consult for Heparin Indication: atrial fibrillation  Allergies  Allergen Reactions   Zestril [Lisinopril] Cough   Hydrocodone Nausea Only   Januvia [Sitagliptin] Other (See Comments)    Unknown   Levitra [Vardenafil] Other (See Comments)    flushing   Prednisone Other (See Comments)    Elevates BGL; patient prefers to not take this   Victoza [Liraglutide] Other (See Comments)    GI upset   Penicillins Rash and Other (See Comments)    Rash around ankles Has patient had a PCN reaction causing immediate rash, facial/tongue/throat swelling, SOB or lightheadedness with hypotension: Yes Has patient had a PCN reaction causing severe rash involving mucus membranes or skin necrosis: Unk Has patient had a PCN reaction that required hospitalization: No Has patient had a PCN reaction occurring within the last 10 years: No If all of the above answers are "NO", then may proceed with Cephalosporin use.     Patient Measurements: Height: 5\' 2"  (157.5 cm) Weight: 74.7 kg (164 lb 10.9 oz) IBW/kg (Calculated) : 54.6 Heparin Dosing Weight: 68 kg  Vital Signs: Temp: 97.6 F (36.4 C) (11/17 0745) Temp Source: Oral (11/17 0745) BP: 89/63 (11/17 0745) Pulse Rate: 46 (11/17 0745)  Labs: Recent Labs    05/18/23 1820 05/19/23 0024 05/19/23 0848 05/19/23 1015 05/20/23 0332  HGB 13.2  --  11.8*  --  12.3*  HCT 39.7  --  35.1*  --  36.4*  PLT 123*  --  121*  --  112*  LABPROT  --  15.7*  --   --   --   INR  --  1.2  --   --   --   HEPARINUNFRC  --   --   --  0.70 0.50  CREATININE 2.38*  --  3.25*  --  4.06*  TROPONINIHS  --  559*  --  569*  --     Estimated Creatinine Clearance: 12 mL/min (A) (by C-G formula based on SCr of 4.06 mg/dL (H)).   Medical History: Past Medical History:  Diagnosis Date   Arthritis    CHF (congestive heart failure) (HCC)    HFrEF   CKD (chronic kidney disease)    sees [place] in [place]   Coronary artery disease     Diabetes (HCC)    type 2   sees endo at [place] in [place]   ESRD (end stage renal disease) on dialysis [facility])    M,W,F   Hypercholesteremia    Hypertension    Myocardial infarction (HCC) 1990   Neuromuscular disorder (HCC)    Pneumonia    PONV (postoperative nausea and vomiting)     Medications:  Medications Prior to Admission  Medication Sig Dispense Refill Last Dose   acetaminophen (TYLENOL) 650 MG CR tablet Take 1,300 mg by mouth in the morning, at noon, and at bedtime.   05/18/2023   allopurinol (ZYLOPRIM) 100 MG tablet Take 100 mg by mouth in the morning.   05/18/2023   aspirin EC 81 MG tablet Take 81 mg by mouth at bedtime.   05/17/2023   atorvastatin (LIPITOR) 40 MG tablet Take 40 mg by mouth at bedtime.   05/17/2023   finasteride (PROSCAR) 5 MG tablet Take 5 mg by mouth daily with supper.   05/18/2023   insulin aspart protamine - aspart (NOVOLOG MIX 70/30 FLEXPEN) (70-30) 100 UNIT/ML FlexPen Inject 14-20 Units into the skin daily as needed (high blood sugar). If BS is <150=0 units, If BS  is 150-200 units=14 units, If BS is >201=20 units   05/18/2023   metoprolol succinate (TOPROL-XL) 50 MG 24 hr tablet Take 1 tablet (50 mg total) by mouth daily. Take with or immediately following a meal. 90 tablet 3 05/18/2023 at 0845   predniSONE (DELTASONE) 10 MG tablet Take 1 tablet by mouth 3 (three) times daily.   05/18/2023   tamsulosin (FLOMAX) 0.4 MG CAPS capsule Take 1 capsule (0.4 mg total) by mouth daily after supper. (Patient taking differently: Take 0.4 mg by mouth at bedtime.) 30 capsule 0 05/18/2023   gabapentin (NEURONTIN) 300 MG capsule Take 1 capsule (300 mg total) by mouth at bedtime. (Patient not taking: Reported on 05/19/2023) 90 capsule 3 Not Taking   Scheduled:   allopurinol  100 mg Oral q AM   atorvastatin  40 mg Oral QHS   gabapentin  300 mg Oral QHS   hydrocortisone sod succinate (SOLU-CORTEF) inj  100 mg Intravenous Q8H   insulin aspart  0-6 Units Subcutaneous Q4H    insulin NPH Human  10 Units Subcutaneous QAC breakfast   midodrine  5 mg Oral TID WC   sodium chloride flush  3 mL Intravenous Q12H   tamsulosin  0.4 mg Oral QHS   Infusions:   amiodarone 30 mg/hr (05/20/23 0651)   heparin 950 Units/hr (05/20/23 0651)   PRN: acetaminophen **OR** acetaminophen, ondansetron **OR** ondansetron (ZOFRAN) IV, sodium chloride flush  Assessment: 82 yom with a history of HTN, HLD, CAD, pna, HF, ESRD on HD MWF, T2DM. Patient is presenting with AF RVR. Patient not on anticoagulation prior to admission. Heparin per pharmacy consult placed for atrial fibrillation.  Heparin level 0.50 is therapeutic with heparin running at 950 units/hr. Hgb (12.3) is stable and PLTs (112) are low stable. Per RN, no report of pauses, issues with the line, or signs of bleeding.   11/17 at 1143: Now transitioning to Eliquis (new start) for oral anticoagulation. With patient age > 80yo, Scr of 4.06 on HD, and weight of 74.7kg, he qualifies for apixaban dose reduction.   Goal of Therapy:  Monitor platelets by anticoagulation protocol: Yes   Plan:  Stop heparin infusion Start Eliquis 2.5 mg PO twice daily Monitor for s/s bleeding   Ernestene Kiel, PharmD PGY1 Pharmacy Resident  Please check AMION for all [facility] Pharmacy phone numbers After 10:00 PM, call Main Pharmacy [phone] 05/20/2023 8:26 AM

## 2023-05-20 NOTE — Plan of Care (Signed)
  Problem: Coping: Goal: Ability to adjust to condition or change in health will improve Outcome: Progressing   Problem: Skin Integrity: Goal: Risk for impaired skin integrity will decrease Outcome: Progressing   

## 2023-05-20 NOTE — Evaluation (Signed)
Occupational Therapy Evaluation Patient Details Name: Robert Lucas MRN: [id number] DOB: 04/13/1941 Today's Date: 05/20/2023   History of Present Illness 82 y.o. male presents to [facility] 05/18/23 w/ hypotension and a-fib w/ RVR. Prior admission in June for sepsis due to CAP. PMHx: ESRD on HD MWF, DM2, systolic/diastolic HF EF 30-35%, HLD, anemia, CAD, HTN, hx of MI, gout   Clinical Impression   PTA, pt lives with wife at [employer]. Pt typically uses cane for mobility but will use RW at HD center. Pt reports Modified Independence with ADLs and shares IADLs with wife. Pt presents now with deficits in strength, standing balance and endurance. Overall, pt requires Min A for bed mobility and transfers to chair using RW w/ shakiness noted. Pt requires no more than Min A for LB ADLs. Anticipate quick progress with continued OOB activity to DC with HHOT vs no OT.   BP supine: 90/64 BP sitting: 101/71 BP after transfer: 104/86 HR 110s-120s SpO2 WFL on RA       If plan is discharge home, recommend the following: A little help with walking and/or transfers;A little help with bathing/dressing/bathroom;Assistance with cooking/housework    Functional Status Assessment  Patient has had a recent decline in their functional status and demonstrates the ability to make significant improvements in function in a reasonable and predictable amount of time.  Equipment Recommendations  None recommended by OT    Recommendations for Other Services       Precautions / Restrictions Precautions Precautions: Fall Precaution Comments: monitor BP, HR Restrictions Weight Bearing Restrictions: No      Mobility Bed Mobility Overal bed mobility: Needs Assistance Bed Mobility: Supine to Sit     Supine to sit: Min assist, Used rails, HOB elevated     General bed mobility comments: assist for lifting trunk, used bed rails and increased time    Transfers Overall transfer level: Needs  assistance Equipment used: Rolling walker (2 wheels) Transfers: Sit to/from Stand, Bed to chair/wheelchair/BSC Sit to Stand: Min assist     Step pivot transfers: Min assist            Balance Overall balance assessment: Needs assistance Sitting-balance support: Feet supported, No upper extremity supported Sitting balance-Leahy Scale: Good     Standing balance support: Bilateral upper extremity supported, During functional activity Standing balance-Leahy Scale: Poor                             ADL either performed or assessed with clinical judgement   ADL Overall ADL's : Needs assistance/impaired Eating/Feeding: Independent   Grooming: Set up;Sitting   Upper Body Bathing: Set up;Sitting   Lower Body Bathing: Minimal assistance;Sit to/from stand   Upper Body Dressing : Set up;Sitting   Lower Body Dressing: Minimal assistance;Sit to/from stand   Toilet Transfer: Minimal assistance;Stand-pivot;Rolling walker (2 wheels)   Toileting- Clothing Manipulation and Hygiene: Minimal assistance;Sitting/lateral lean;Sit to/from stand         General ADL Comments: Pt reported some difficulty lifting LE in and out of car; educated and demo using of gait belt as leg lifter     Vision Baseline Vision/History: 1 Wears glasses Ability to See in Adequate Light: 0 Adequate Patient Visual Report: No change from baseline Vision Assessment?: No apparent visual deficits     Perception         Praxis         Pertinent Vitals/Pain Pain Assessment Pain Assessment: No/denies  pain     Extremity/Trunk Assessment Upper Extremity Assessment Upper Extremity Assessment: Overall WFL for tasks assessed;Right hand dominant;RUE deficits/detail RUE Deficits / Details: tremulous in R UE; pt reports sometimes this happens when blood sugar elevated; did not appear like asterixis type movements   Lower Extremity Assessment Lower Extremity Assessment: Defer to PT evaluation    Cervical / Trunk Assessment Cervical / Trunk Assessment: Normal   Communication Communication Communication: No apparent difficulties   Cognition Arousal: Alert Behavior During Therapy: WFL for tasks assessed/performed Overall Cognitive Status: Within Functional Limits for tasks assessed                                       General Comments  HR to 120s with activity. O2 probe not reading on cold fingers. applied to earlobe w/ SpO2 > 96% on RA    Exercises     Shoulder Instructions      Home Living Family/patient expects to be discharged to:: Other (Comment) (Independent living ([facility])) Living Arrangements: Spouse/significant other Available Help at Discharge: Family;Available 24 hours/day Type of Home: Other(Comment) (duplex) Home Access: Level entry     Home Layout: One level     Bathroom Shower/Tub: Chief Strategy Officer: Standard     Home Equipment: Agricultural consultant (2 wheels);Cane - single point;Shower seat;Electric scooter          Prior Functioning/Environment Prior Level of Function : Independent/Modified Independent;Driving;History of Falls (last six months)             Mobility Comments: cane typically but will use RW to enter/exit dialysis center ADLs Comments: MOD I for ADLs, able to do IADLs with wife, uses scooter to take trash out and check mail, etc. Wife typically drives. loves sports        OT Problem List: Decreased strength;Decreased activity tolerance;Impaired balance (sitting and/or standing);Cardiopulmonary status limiting activity      OT Treatment/Interventions: Self-care/ADL training;Therapeutic exercise;Energy conservation;DME and/or AE instruction;Therapeutic activities;Patient/family education;Balance training    OT Goals(Current goals can be found in the care plan section) Acute Rehab OT Goals Patient Stated Goal: increase strength, home tomorrow OT Goal Formulation: With patient Time For  Goal Achievement: 06/03/23 Potential to Achieve Goals: Good ADL Goals Pt Will Perform Grooming: with modified independence;standing Pt Will Perform Lower Body Dressing: with modified independence;sit to/from stand Pt Will Transfer to Toilet: with modified independence;ambulating  OT Frequency: Min 1X/week    Co-evaluation              AM-PAC OT "6 Clicks" Daily Activity     Outcome Measure Help from another person eating meals?: None Help from another person taking care of personal grooming?: A Little Help from another person toileting, which includes using toliet, bedpan, or urinal?: A Little Help from another person bathing (including washing, rinsing, drying)?: A Little Help from another person to put on and taking off regular upper body clothing?: A Little Help from another person to put on and taking off regular lower body clothing?: A Little 6 Click Score: 19   End of Session Equipment Utilized During Treatment: Gait belt;Rolling walker (2 wheels) Nurse Communication: Mobility status  Activity Tolerance: Patient tolerated treatment well Patient left: in chair;with call bell/phone within reach  OT Visit Diagnosis: Unsteadiness on feet (R26.81);Other abnormalities of gait and mobility (R26.89);Muscle weakness (generalized) (M62.81)  Time: 0826-0905 OT Time Calculation (min): 39 min Charges:  OT General Charges $OT Visit: 1 Visit OT Evaluation $OT Eval Low Complexity: 1 Low OT Treatments $Self Care/Home Management : 8-22 mins $Therapeutic Activity: 8-22 mins  Bradd Canary, OTR/L Acute Rehab Services Office: [phone]   Lorre Munroe 05/20/2023, 10:06 AM

## 2023-05-20 NOTE — Progress Notes (Signed)
[facility]                                                                               Robert Lucas, is a 82 y.o. male, DOB - [birth year], JYN:[id number] Admit date - 05/18/2023    Outpatient Primary MD for the patient is Joycelyn Rua, MD  LOS - 1  days    Brief summary    Robert Lucas is a 82 y.o. male with medical history significant of ESRD on HD MWF ,  DM2, systolic/diastolic heart failure ef 30-35%, HLD, anemia, CAD, HTN, hx of MI, gout, Presented with hypotension, atrial fibrillation with RVR. He was started on IV amiodarone and IV cardizem, IV heparin.   Assessment & Plan    Assessment and Plan:  Atrial fibrillation with with RVR: Rate not well controlled.  Initially started on IV Cardizem transitioned to  amiodarone gtt. He remains in afib with RVR.  Cardiology on board and continue the IV heparin.  Tsh is wnl.  Echocardiogram  ordered and pending.   Acute on chronic combined CHF: - Fluid management with HD. Plan for HD back to back tomorrow and Tuesday as per nephrology.  - on midodrine 5 mg TID.   Hypotension.  Bp parameters improved with solucortef and midodrine.   Diabetes Mellitus type 2: CBG (last 3)  Recent Labs    05/20/23 0757 05/20/23 1231 05/20/23 1532  GLUCAP 137* 199* 303*   Uncontrolled with hyperglycemia probably from steroids.  Continue with Novolin N 10 units daily.  Add novolog 3 units daily.  Get A1c.     ESRD on HD Nephrology on board and appreciate recommendations.    Hyperlipidemia Resume statin.    BPH Continue with flomax.    Hyponatremia Sodium of 132 from ESRD.   Elevated liver enzymes ? CHF Increased liver echotexture consistent with hepatic steatosis.  Repeat labs in am.     Estimated body mass index is 30.12 kg/m as calculated from the following:   Height as of this encounter: 5\' 2"  (1.575 m).   Weight as of this encounter: 74.7 kg.  Code Status: full code.  DVT Prophylaxis:   apixaban (ELIQUIS) tablet 2.5 mg Start: 05/20/23 1245 SCDs Start: 05/19/23 0549 apixaban (ELIQUIS) tablet 2.5 mg   Level of Care: Level of care: Progressive Family Communication: none at bedside.   Disposition Plan:     Remains inpatient appropriate:  pending   Procedures:  None.   Consultants:   Cardiology.   Antimicrobials:   Anti-infectives (From admission, onward)    None        Medications  Scheduled Meds:  allopurinol  100 mg Oral q AM   apixaban  2.5 mg Oral BID   atorvastatin  40 mg Oral QHS   Chlorhexidine Gluconate Cloth  6 each Topical Q0600   gabapentin  300 mg Oral QHS   hydrocortisone sod succinate (SOLU-CORTEF) inj  100 mg Intravenous Q8H   insulin aspart  0-6 Units Subcutaneous Q4H   insulin NPH Human  10 Units Subcutaneous QAC breakfast   midodrine  10 mg Oral TID WC   sodium chloride flush  3 mL Intravenous Q12H  tamsulosin  0.4 mg Oral QHS   Continuous Infusions:  amiodarone 30 mg/hr (05/20/23 1055)   PRN Meds:.acetaminophen **OR** acetaminophen, ondansetron **OR** ondansetron (ZOFRAN) IV, sodium chloride flush    Subjective:   Robert Lucas was seen and examined today.  Feeling better. Weaned off oxygen.   Objective:   Vitals:   05/20/23 1155 05/20/23 1320 05/20/23 1535 05/20/23 1700  BP: (!) 149/122 (!) 90/52  92/64  Pulse: 100 (!) 34 67 (!) 117  Resp: (!) 22 20 (!) 21 20  Temp: 97.6 F (36.4 C)  97.6 F (36.4 C)   TempSrc: Oral  Oral   SpO2: 90% 100% 100% 98%  Weight:      Height:        Intake/Output Summary (Last 24 hours) at 05/20/2023 1859 Last data filed at 05/20/2023 1700 Gross per 24 hour  Intake 832.21 ml  Output --  Net 832.21 ml   Filed Weights   05/19/23 1140 05/20/23 0428  Weight: 72.6 kg 74.7 kg     Exam General exam: Appears calm and comfortable  Respiratory system: Clear to auscultation. Respiratory effort normal. Cardiovascular system: S1 & S2 heard, irregularly irregular.  Gastrointestinal  system: Abdomen is nondistended, soft and nontender.  Central nervous system: Alert and oriented.  Extremities: Symmetric 5 x 5 power. Skin: No rashes,  Psychiatry: mood is appropriate.     Data Reviewed:  I have personally reviewed following labs and imaging studies   CBC Lab Results  Component Value Date   WBC 15.1 (H) 05/20/2023   RBC 3.60 (L) 05/20/2023   HGB 12.3 (L) 05/20/2023   HCT 36.4 (L) 05/20/2023   MCV 101.1 (H) 05/20/2023   MCH 34.2 (H) 05/20/2023   PLT 112 (L) 05/20/2023   MCHC 33.8 05/20/2023   RDW 16.9 (H) 05/20/2023   LYMPHSABS 1.3 12/18/2022   MONOABS 0.9 12/18/2022   EOSABS 0.0 12/18/2022   BASOSABS 0.0 12/18/2022     Last metabolic panel Lab Results  Component Value Date   NA 130 (L) 05/20/2023   K 4.6 05/20/2023   CL 95 (L) 05/20/2023   CO2 20 (L) 05/20/2023   BUN 52 (H) 05/20/2023   CREATININE 4.06 (H) 05/20/2023   GLUCOSE 163 (H) 05/20/2023   GFRNONAA 14 (L) 05/20/2023   GFRAA 26 (L) 05/07/2019   CALCIUM 8.9 05/20/2023   PHOS 6.5 (H) 05/19/2023   PROT 5.4 (L) 05/19/2023   ALBUMIN 2.5 (L) 05/19/2023   BILITOT 1.9 (H) 05/19/2023   ALKPHOS 157 (H) 05/19/2023   AST 51 (H) 05/19/2023   ALT 78 (H) 05/19/2023   ANIONGAP 15 05/20/2023    CBG (last 3)  Recent Labs    05/20/23 0757 05/20/23 1231 05/20/23 1532  GLUCAP 137* 199* 303*      Coagulation Profile: Recent Labs  Lab 05/19/23 0024  INR 1.2     Radiology Studies: CT Angio Chest Pulmonary Embolism (PE) W or WO Contrast  Result Date: 05/18/2023 CLINICAL DATA:  Hypotensive following dialysis treatment, initial encounter EXAM: CT ANGIOGRAPHY CHEST WITH CONTRAST TECHNIQUE: Multidetector CT imaging of the chest was performed using the standard protocol during bolus administration of intravenous contrast. Multiplanar CT image reconstructions and MIPs were obtained to evaluate the vascular anatomy. RADIATION DOSE REDUCTION: This exam was performed according to the departmental  dose-optimization program which includes automated exposure control, adjustment of the mA and/or kV according to patient size and/or use of iterative reconstruction technique. CONTRAST:  75mL OMNIPAQUE IOHEXOL 350 MG/ML SOLN COMPARISON:  Chest x-ray from earlier in the same day. FINDINGS: Cardiovascular: Atherosclerotic calcifications of the thoracic aorta are noted. No aneurysmal dilatation is seen. Cardiac shadow is enlarged. Coronary calcifications are noted. The pulmonary artery shows a normal branching pattern bilaterally. No filling defects to suggest pulmonary emboli are seen. Mediastinum/Nodes: Thoracic inlet is within normal limits. No hilar or mediastinal adenopathy is noted. The esophagus as visualized is within normal limits. Lungs/Pleura: Small bilateral pleural effusions right greater than left are seen. Mild bilateral atelectatic changes are noted. Upper Abdomen: Visualized upper abdomen demonstrates mild ascites. Kidneys are shrunken consistent with the known history of end-stage renal disease. Musculoskeletal: Degenerative changes of the thoracic spine are noted stable from the prior exam. Review of the MIP images confirms the above findings. IMPRESSION: No evidence of pulmonary emboli. Bilateral small effusions with associated atelectasis right greater than left. No other acute abnormality is noted. Aortic Atherosclerosis (ICD10-I70.0). Electronically Signed   By: Alcide Clever M.D.   On: 05/18/2023 23:56   US Abdomen Limited RUQ (LIVER/GB)  Result Date: 05/18/2023 CLINICAL DATA:  Elevated liver function tests EXAM: ULTRASOUND ABDOMEN LIMITED RIGHT UPPER QUADRANT COMPARISON:  11/15/2017 FINDINGS: Gallbladder: Prior cholecystectomy. Common bile duct: Diameter: 5 mm Liver: Diffuse increased liver echotexture consistent with hepatic steatosis. No focal liver abnormality. Portal vein is patent on color Doppler imaging with normal direction of blood flow towards the liver. Other: Incidental right  pleural effusion. Trace right upper quadrant ascites. IMPRESSION: 1. Increased liver echotexture consistent with hepatic steatosis. 2. Prior cholecystectomy. 3. Trace right upper quadrant ascites. 4. Incidental right pleural effusion. Electronically Signed   By: Sharlet Salina M.D.   On: 05/18/2023 23:11   DG Chest Portable 1 View  Result Date: 05/18/2023 CLINICAL DATA:  Hypotension, tachycardia, atrial fibrillation EXAM: PORTABLE CHEST 1 VIEW COMPARISON:  03/25/2023 FINDINGS: Two frontal views of the chest demonstrate an enlarged cardiac silhouette. Chronic central vascular prominence, without acute airspace disease, effusion, or pneumothorax. Linear consolidation at the left lung base likely reflects subsegmental atelectasis. No acute bony abnormalities. IMPRESSION: 1. Enlarged cardiac silhouette, with chronic central vascular congestion. No overt edema. Electronically Signed   By: Sharlet Salina M.D.   On: 05/18/2023 22:22       Kathlen Mody M.D. [facility] 05/20/2023, 6:59 PM  Available via Epic secure chat 7am-7pm After 7 pm, please refer to night coverage provider listed on amion.

## 2023-05-20 NOTE — Progress Notes (Signed)
Progress Note  Patient Name: Robert Lucas Date of Encounter: 05/20/2023 Primary Cardiologist: Elder Negus, MD   Subjective   Overnight off O2. No CP, SOB, Palpitations.  Still dealing with hypotension  Vital Signs    Vitals:   05/19/23 2339 05/20/23 0428 05/20/23 0625 05/20/23 0745  BP: (!) 101/58 91/61 (!) 87/63 (!) 89/63  Pulse: (!) 117 (!) 104 (!) 101 (!) 46  Resp: 20 20 14  (!) 25  Temp: (!) 97.5 F (36.4 C) 97.6 F (36.4 C) 97.6 F (36.4 C) 97.6 F (36.4 C)  TempSrc: Oral Axillary Axillary Oral  SpO2: 100% 99% 99% 93%  Weight:  74.7 kg    Height:        Intake/Output Summary (Last 24 hours) at 05/20/2023 1050 Last data filed at 05/20/2023 0900 Gross per 24 hour  Intake 914.56 ml  Output --  Net 914.56 ml   Filed Weights   05/19/23 1140 05/20/23 0428  Weight: 72.6 kg 74.7 kg    Physical Exam   GEN: No acute distress.   Neck: + JVD Cardiac: iRRR, no murmurs, rubs, or gallops.  Respiratory: Crackles bilaterally GI: Soft, nontender, distended  MS: +2 edema  Labs   Telemetry: AF RVR Rates ~ 110 bpm   Chemistry Recent Labs  Lab 05/18/23 1820 05/19/23 0848 05/20/23 0332  NA 134* 132* 130*  K 4.5 5.1 4.6  CL 93* 95* 95*  CO2 26 22 20*  GLUCOSE 198* 224* 163*  BUN 27* 39* 52*  CREATININE 2.38* 3.25* 4.06*  CALCIUM 9.0 8.5* 8.9  PROT 6.2* 5.4*  --   ALBUMIN 3.0* 2.5*  --   AST 57* 51*  --   ALT 93* 78*  --   ALKPHOS 197* 157*  --   BILITOT 2.0* 1.9*  --   GFRNONAA 26* 18* 14*  ANIONGAP 15 15 15      Hematology Recent Labs  Lab 05/18/23 1820 05/19/23 0024 05/19/23 0848 05/20/23 0332  WBC 10.5  --  11.4* 15.1*  RBC 3.93* 3.36* 3.39* 3.60*  HGB 13.2  --  11.8* 12.3*  HCT 39.7  --  35.1* 36.4*  MCV 101.0*  --  103.5* 101.1*  MCH 33.6  --  34.8* 34.2*  MCHC 33.2  --  33.6 33.8  RDW 16.7*  --  17.0* 16.9*  PLT 123*  --  121* 112*    Cardiac EnzymesNo results for input(s): "TROPONINI" in the last 168 hours. No results  for input(s): "TROPIPOC" in the last 168 hours.   BNP Recent Labs  Lab 05/19/23 0024  BNP 2,343.1*     DDimer No results for input(s): "DDIMER" in the last 168 hours.   Cardiac Studies   Cardiac Studies & Procedures       ECHOCARDIOGRAM  PCV ECHOCARDIOGRAM COMPLETE 01/24/2022  Narrative Echocardiogram 01/24/2022: Left ventricle cavity is normal in size. Mild concentric hypertrophy of the left ventricle.  Mild global and severe apical hypokinesis. LVEF 45-50%. LVEF 45-50%. Doppler evidence of grade I (impaired) diastolic dysfunction, normal LAP. Left atrial cavity is moderately dilated. Trileaflet aortic valve with mild calcification. Trace aortic valve stenosis. Vmax 1.9 m/sec, mean PG 8 mmHg, AVA 1.7 cm by continuity equation. No regurgitation. Mild calcification of the mitral valve annulus. Trace mitral regurgitation. Trace tricuspid regurgitation. Estimated pulmonary artery systolic pressure 25 Previous study on 11/15/2020, reported grade II diastolic dysfunction, estimated PASP 41 mmHg.    MONITORS  LONG TERM MONITOR (3-14 DAYS) 04/06/2022  Narrative Mobile cardiac  telemetry 13 days 03/16/2022 - 03/30/2022: Dominant rhythm: Sinus. HR 51-113 bpm. Avg HR 75 bpm, in sinus rhythm. >4000 episodes of probable atrial tachycardia, fastest at 200 bpm, avg 128 bpm, longest for 46 secs. 8.6% isolated SVE, 4.0% couplets, 2.0% triplets. 815 episodes of VT, fastest at 226 bpm for 5 beats, longest for 18 beats at 174 bpm. 5.7% isolated VE, <1% couplet/triplets. No atrial fibrillation/atrial flutter/high grade AV block, sinus pause >3sec noted. 0 patient triggered events.            Assessment & Plan   Atrial Fibrillation with Rapid Ventricular Response (Afib, RVR; new onset) - Continue IV amiodarone transition to DOAC today   Acute on Chronic HF - volume status with HD removal, still hypervolemic - cannot tolerate GDMT (Hypotension and ESRD) - if rare are difficult to control  despite HD Monday and Tuesday, consider TEE/DCCV  Hypotension - Experienced hypotension, which may complicate dialysis.  - midodrine to TID   End Stage Renal Disease (ESRD) - Consider a more aggressive dry weight, will ultimately defer to nephrology   For questions or updates, please contact [facility] Please consult [email or web address] for contact info under Cardiology/STEMI.      Riley Lam, MD FASE [facility] Cardiologist [facility]  [address] [place], [address] [place], [place] [address] [phone]  10:50 AM

## 2023-05-20 NOTE — Evaluation (Signed)
Physical Therapy Evaluation Patient Details Name: Robert Lucas MRN: [id number] DOB: 1941/05/18 Today's Date: 05/20/2023  History of Present Illness  82 y.o. male presents to [facility]/18/23 w/ hypotension and a-fib w/ RVR. Prior admission in June for sepsis due to CAP. PMHx: ESRD on HD MWF, DM2, systolic/diastolic HF EF 30-35%, HLD, anemia, CAD, HTN, hx of MI, gout   Clinical Impression  Pt in recliner upon arrival and agreeable to PT eval. Prior to admission, pt reports sometimes needing physical assistance from wife to stand. Pt was independent with ambulation and ADLs with SP cane for short distances and RW for longer distance. Pt reports having a fall 1 week prior where his legs gave out on him. In today's session, pt was able to stand with MinA and ambulate in the room with CGA and RW. HR was in the 120's with ambulation. Pt presents to therapy session with decreased strength, balance, and mobility. Pt would benefit from acute skilled PT to address functional impairments. Recommending post-acute HHPT, however, pt is currently declining. Acute PT to follow.          If plan is discharge home, recommend the following: A little help with walking and/or transfers   Can travel by private vehicle    Yes    Equipment Recommendations None recommended by PT     Functional Status Assessment Patient has had a recent decline in their functional status and demonstrates the ability to make significant improvements in function in a reasonable and predictable amount of time.     Precautions / Restrictions Precautions Precautions: Fall Precaution Comments: monitor BP, HR Restrictions Weight Bearing Restrictions: No      Mobility  Bed Mobility  General bed mobility comments: in recliner upon arrival    Transfers Overall transfer level: Needs assistance Equipment used: Rolling walker (2 wheels) Transfers: Sit to/from Stand Sit to Stand: Min assist    General transfer comment: MinA for  initial rise and boost up, cues for hand placement    Ambulation/Gait Ambulation/Gait assistance: Contact guard assist Gait Distance (Feet): 40 Feet Assistive device: Rolling walker (2 wheels) Gait Pattern/deviations: Step-through pattern, Shuffle Gait velocity: dec     General Gait Details: slow and steady gait      Balance Overall balance assessment: Needs assistance Sitting-balance support: Feet supported, No upper extremity supported Sitting balance-Leahy Scale: Good     Standing balance support: Bilateral upper extremity supported, During functional activity Standing balance-Leahy Scale: Poor Standing balance comment: reliant on RW        Pertinent Vitals/Pain Pain Assessment Pain Assessment: No/denies pain    Home Living Family/patient expects to be discharged to:: Other (Comment) (Independent living ([facility])) Living Arrangements: Spouse/significant other Available Help at Discharge: Family;Available 24 hours/day Type of Home: Other(Comment) (duplex) Home Access: Level entry    Home Layout: One level Home Equipment: Agricultural consultant (2 wheels);Cane - single point;Shower seat;Electric scooter      Prior Function Prior Level of Function : Independent/Modified Independent;Driving;History of Falls (last six months)      Mobility Comments: SP cane for short distances, RW and electric scooter for longer distances. Reports fall 1 weeks prior where legs gave out ADLs Comments: MOD I for ADLs, able to do IADLs with wife, uses scooter to take trash out and check mail, etc. Wife typically drives. loves sports     Extremity/Trunk Assessment   Upper Extremity Assessment Upper Extremity Assessment: Defer to OT evaluation RUE Deficits / Details: tremulous in R UE; pt reports sometimes  this happens when blood sugar elevated; did not appear like asterixis type movements    Lower Extremity Assessment Lower Extremity Assessment: Generalized weakness    Cervical /  Trunk Assessment Cervical / Trunk Assessment: Normal  Communication   Communication Communication: No apparent difficulties  Cognition Arousal: Alert Behavior During Therapy: WFL for tasks assessed/performed Overall Cognitive Status: Within Functional Limits for tasks assessed      General Comments General comments (skin integrity, edema, etc.): HR increases to 120s with ambulation, other VSS on RA     PT Assessment Patient needs continued PT services  PT Problem List Decreased strength;Decreased activity tolerance;Decreased balance;Decreased mobility;Decreased coordination       PT Treatment Interventions DME instruction;Gait training;Functional mobility training;Therapeutic activities;Therapeutic exercise;Balance training;Patient/family education    PT Goals (Current goals can be found in the Care Plan section)  Acute Rehab PT Goals Patient Stated Goal: to get better PT Goal Formulation: With patient Time For Goal Achievement: 06/03/23 Potential to Achieve Goals: Good    Frequency Min 1X/week        AM-PAC PT "6 Clicks" Mobility  Outcome Measure Help needed turning from your back to your side while in a flat bed without using bedrails?: A Little Help needed moving from lying on your back to sitting on the side of a flat bed without using bedrails?: A Little Help needed moving to and from a bed to a chair (including a wheelchair)?: A Little Help needed standing up from a chair using your arms (e.g., wheelchair or bedside chair)?: A Little Help needed to walk in hospital room?: A Little Help needed climbing 3-5 steps with a railing? : A Lot 6 Click Score: 17    End of Session Equipment Utilized During Treatment: Gait belt Activity Tolerance: Patient tolerated treatment well Patient left: in chair;with call bell/phone within reach Nurse Communication: Mobility status PT Visit Diagnosis: Unsteadiness on feet (R26.81);Muscle weakness (generalized) (M62.81)    Time:  1610-9604 PT Time Calculation (min) (ACUTE ONLY): 16 min   Charges:   PT Evaluation $PT Eval Low Complexity: 1 Low   PT General Charges $$ ACUTE PT VISIT: 1 Visit         Hilton Cork, PT, DPT Secure Chat Preferred  Rehab Office [phone]   Arturo Morton Brion Aliment 05/20/2023, 11:55 AM

## 2023-05-20 NOTE — Plan of Care (Signed)
  Problem: Education: Goal: Ability to describe self-care measures that may prevent or decrease complications (Diabetes Survival Skills Education) will improve Outcome: Progressing Goal: Individualized Educational Video(s) Outcome: Progressing   Problem: Coping: Goal: Ability to adjust to condition or change in health will improve Outcome: Progressing   Problem: Fluid Volume: Goal: Ability to maintain a balanced intake and output will improve Outcome: Progressing   Problem: Health Behavior/Discharge Planning: Goal: Ability to identify and utilize available resources and services will improve Outcome: Progressing Goal: Ability to manage health-related needs will improve Outcome: Progressing   Problem: Metabolic: Goal: Ability to maintain appropriate glucose levels will improve Outcome: Progressing   Problem: Nutritional: Goal: Maintenance of adequate nutrition will improve Outcome: Progressing Goal: Progress toward achieving an optimal weight will improve Outcome: Progressing   Problem: Skin Integrity: Goal: Risk for impaired skin integrity will decrease Outcome: Progressing   Problem: Tissue Perfusion: Goal: Adequacy of tissue perfusion will improve Outcome: Progressing   Problem: Education: Goal: Knowledge of disease or condition will improve Outcome: Progressing Goal: Understanding of medication regimen will improve Outcome: Progressing Goal: Individualized Educational Video(s) Outcome: Progressing   Problem: Activity: Goal: Ability to tolerate increased activity will improve Outcome: Progressing   Problem: Cardiac: Goal: Ability to achieve and maintain adequate cardiopulmonary perfusion will improve Outcome: Progressing   Problem: Health Behavior/Discharge Planning: Goal: Ability to safely manage health-related needs after discharge will improve Outcome: Progressing   Problem: Education: Goal: Knowledge of General Education information will  improve Description: Including pain rating scale, medication(s)/side effects and non-pharmacologic comfort measures Outcome: Progressing   Problem: Health Behavior/Discharge Planning: Goal: Ability to manage health-related needs will improve Outcome: Progressing   Problem: Clinical Measurements: Goal: Ability to maintain clinical measurements within normal limits will improve Outcome: Progressing Goal: Will remain free from infection Outcome: Progressing Goal: Diagnostic test results will improve Outcome: Progressing Goal: Respiratory complications will improve Outcome: Progressing Goal: Cardiovascular complication will be avoided Outcome: Progressing   Problem: Activity: Goal: Risk for activity intolerance will decrease Outcome: Progressing   Problem: Nutrition: Goal: Adequate nutrition will be maintained Outcome: Progressing   Problem: Coping: Goal: Level of anxiety will decrease Outcome: Progressing   Problem: Elimination: Goal: Will not experience complications related to bowel motility Outcome: Progressing Goal: Will not experience complications related to urinary retention Outcome: Progressing   Problem: Pain Management: Goal: General experience of comfort will improve Outcome: Progressing   Problem: Safety: Goal: Ability to remain free from injury will improve Outcome: Progressing   Problem: Skin Integrity: Goal: Risk for impaired skin integrity will decrease Outcome: Progressing

## 2023-05-20 NOTE — Progress Notes (Signed)
[facility] Progress Note   Subjective:   Feeling ok, no new issues.  Remains in A fib with RVR with BPs on the low side.  Just worked with PT ambulating with RW.  Objective Vitals:   05/19/23 2339 05/20/23 0428 05/20/23 0625 05/20/23 0745  BP: (!) 101/58 91/61 (!) 87/63 (!) 89/63  Pulse: (!) 117 (!) 104 (!) 101 (!) 46  Resp: 20 20 14  (!) 25  Temp: (!) 97.5 F (36.4 C) 97.6 F (36.4 C) 97.6 F (36.4 C) 97.6 F (36.4 C)  TempSrc: Oral Axillary Axillary Oral  SpO2: 100% 99% 99% 93%  Weight:  74.7 kg    Height:       Physical Exam Gen: elderly man who is comfortably lying in bed  Eyes: glasses, EOMI ENT: MMM CV: A fib rate in 110-120s on monitor (just ambulated) Abd: soft, nontender Back: lungs clear but dec BS bases, no rales, on 2L [place] O2 Extr: 2+ LE edema, LUE AVG +ecchymoses from infiltration weeks ago, no erythema or warmth, +t/b Neuro: conversant non focal  Additional Objective Labs: Basic Metabolic Panel: Recent Labs  Lab 05/18/23 1820 05/19/23 0024 05/19/23 0848 05/20/23 0332  NA 134*  --  132* 130*  K 4.5  --  5.1 4.6  CL 93*  --  95* 95*  CO2 26  --  22 20*  GLUCOSE 198*  --  224* 163*  BUN 27*  --  39* 52*  CREATININE 2.38*  --  3.25* 4.06*  CALCIUM 9.0  --  8.5* 8.9  PHOS  --  6.2* 6.5*  --    Liver Function Tests: Recent Labs  Lab 05/18/23 1820 05/19/23 0848  AST 57* 51*  ALT 93* 78*  ALKPHOS 197* 157*  BILITOT 2.0* 1.9*  PROT 6.2* 5.4*  ALBUMIN 3.0* 2.5*   No results for input(s): "LIPASE", "AMYLASE" in the last 168 hours. CBC: Recent Labs  Lab 05/18/23 1820 05/19/23 0848 05/20/23 0332  WBC 10.5 11.4* 15.1*  HGB 13.2 11.8* 12.3*  HCT 39.7 35.1* 36.4*  MCV 101.0* 103.5* 101.1*  PLT 123* 121* 112*   Blood Culture    Component Value Date/Time   SDES BLOOD RIGHT HAND 12/18/2022 0716   SPECREQUEST  12/18/2022 0716    BOTTLES DRAWN AEROBIC ONLY Blood Culture results may not be optimal due to an inadequate volume of  blood received in culture bottles   CULT  12/18/2022 0716    NO GROWTH 5 DAYS Performed at [facility], [address], [place], [place] [address]    REPTSTATUS 12/23/2022 FINAL 12/18/2022 0716    Cardiac Enzymes: No results for input(s): "CKTOTAL", "CKMB", "CKMBINDEX", "TROPONINI" in the last 168 hours. CBG: Recent Labs  Lab 05/19/23 1642 05/19/23 2014 05/19/23 2335 05/20/23 0427 05/20/23 0757  GLUCAP 274* 320* 276* 144* 137*   Iron Studies:  Recent Labs    05/19/23 0024  IRON 155  TIBC 238*  FERRITIN 1,620*   @lablastinr3 @ Studies/Results: CT Angio Chest Pulmonary Embolism (PE) W or WO Contrast  Result Date: 05/18/2023 CLINICAL DATA:  Hypotensive following dialysis treatment, initial encounter EXAM: CT ANGIOGRAPHY CHEST WITH CONTRAST TECHNIQUE: Multidetector CT imaging of the chest was performed using the standard protocol during bolus administration of intravenous contrast. Multiplanar CT image reconstructions and MIPs were obtained to evaluate the vascular anatomy. RADIATION DOSE REDUCTION: This exam was performed according to the departmental dose-optimization program which includes automated exposure control, adjustment of the mA and/or kV according to patient  size and/or use of iterative reconstruction technique. CONTRAST:  75mL OMNIPAQUE IOHEXOL 350 MG/ML SOLN COMPARISON:  Chest x-ray from earlier in the same day. FINDINGS: Cardiovascular: Atherosclerotic calcifications of the thoracic aorta are noted. No aneurysmal dilatation is seen. Cardiac shadow is enlarged. Coronary calcifications are noted. The pulmonary artery shows a normal branching pattern bilaterally. No filling defects to suggest pulmonary emboli are seen. Mediastinum/Nodes: Thoracic inlet is within normal limits. No hilar or mediastinal adenopathy is noted. The esophagus as visualized is within normal limits. Lungs/Pleura: Small bilateral pleural effusions right greater than left are seen. Mild bilateral  atelectatic changes are noted. Upper Abdomen: Visualized upper abdomen demonstrates mild ascites. Kidneys are shrunken consistent with the known history of end-stage renal disease. Musculoskeletal: Degenerative changes of the thoracic spine are noted stable from the prior exam. Review of the MIP images confirms the above findings. IMPRESSION: No evidence of pulmonary emboli. Bilateral small effusions with associated atelectasis right greater than left. No other acute abnormality is noted. Aortic Atherosclerosis (ICD10-I70.0). Electronically Signed   By: Alcide Clever M.D.   On: 05/18/2023 23:56   US Abdomen Limited RUQ (LIVER/GB)  Result Date: 05/18/2023 CLINICAL DATA:  Elevated liver function tests EXAM: ULTRASOUND ABDOMEN LIMITED RIGHT UPPER QUADRANT COMPARISON:  11/15/2017 FINDINGS: Gallbladder: Prior cholecystectomy. Common bile duct: Diameter: 5 mm Liver: Diffuse increased liver echotexture consistent with hepatic steatosis. No focal liver abnormality. Portal vein is patent on color Doppler imaging with normal direction of blood flow towards the liver. Other: Incidental right pleural effusion. Trace right upper quadrant ascites. IMPRESSION: 1. Increased liver echotexture consistent with hepatic steatosis. 2. Prior cholecystectomy. 3. Trace right upper quadrant ascites. 4. Incidental right pleural effusion. Electronically Signed   By: Sharlet Salina M.D.   On: 05/18/2023 23:11   DG Chest Portable 1 View  Result Date: 05/18/2023 CLINICAL DATA:  Hypotension, tachycardia, atrial fibrillation EXAM: PORTABLE CHEST 1 VIEW COMPARISON:  03/25/2023 FINDINGS: Two frontal views of the chest demonstrate an enlarged cardiac silhouette. Chronic central vascular prominence, without acute airspace disease, effusion, or pneumothorax. Linear consolidation at the left lung base likely reflects subsegmental atelectasis. No acute bony abnormalities. IMPRESSION: 1. Enlarged cardiac silhouette, with chronic central vascular  congestion. No overt edema. Electronically Signed   By: Sharlet Salina M.D.   On: 05/18/2023 22:22   Medications:  amiodarone 30 mg/hr (05/20/23 0651)   heparin 950 Units/hr (05/20/23 0651)    allopurinol  100 mg Oral q AM   atorvastatin  40 mg Oral QHS   gabapentin  300 mg Oral QHS   hydrocortisone sod succinate (SOLU-CORTEF) inj  100 mg Intravenous Q8H   insulin aspart  0-6 Units Subcutaneous Q4H   insulin NPH Human  10 Units Subcutaneous QAC breakfast   midodrine  5 mg Oral TID WC   sodium chloride flush  3 mL Intravenous Q12H   tamsulosin  0.4 mg Oral QHS    HD orders:  NW MWF 400/auto 1.5 3K/2.5Ca EDW 68.1 4h Calcitriol 1.25 three times per week No ESA   Assessment/PlanLarry G Lucas is an 82 y.o. male with ESRD on HD MWF, HTN, DM, CAD, combined CHF (2023 grade 1 DD and EF 45%) currently hospitalized with new A fib complicated by hypotension and nephrology is consulted for evaluation and management of ESRD.    **A fib with RVR complicated by hypotension:  cardiology consulting - on amio now. On heparin gtt. TTE pending.  CTA neg PE.     **ESRD on HD MWF:  ran  full treatment 05/18/23.  Hopefully will stabilize A fib/BP by next treatment.  Cardiology mentioned to pt concern for volume playing role --> I don't see need for emergent UF today but if that become necessary and BP remains low he'd need ICU and pressor support to facilitate.   Plan HD tomorrow AM then probably will benefit from UF session on Tues.  Daily labs.  Strict I/Os.  Daily weights.  Dose meds for ESRD.    **HTN: dx HTN at baseline, now with hypotension in setting of a fib, hold meds. On midodrine 5 TID, titrate to 10 TID.    **Anemia of CKD:  Hb in 11s, no ESA needed outpt   **BMM: cont outpt calcitriol and binder doses. Renal diet.    Will follow, call with concerns.  Estill Bakes MD 05/20/2023, 9:31 AM  [facility] Pager: [phone]

## 2023-05-20 NOTE — Progress Notes (Signed)
   05/20/23 0428  Assess: MEWS Score  Temp 97.6 F (36.4 C)  BP 91/61  MAP (mmHg) 71  Pulse Rate (!) 104  ECG Heart Rate (!) 104  Resp 20  Level of Consciousness Alert  SpO2 99 %  O2 Device Nasal Cannula  Patient Activity (if Appropriate) In bed  O2 Flow Rate (L/min) 2 L/min  Assess: MEWS Score  MEWS Temp 0  MEWS Systolic 1  MEWS Pulse 1  MEWS RR 0  MEWS LOC 0  MEWS Score 2  MEWS Score Color Yellow  Assess: if the MEWS score is Yellow or Red  Were vital signs accurate and taken at a resting state? Yes  Does the patient meet 2 or more of the SIRS criteria? Yes  Does the patient have a confirmed or suspected source of infection? No  MEWS guidelines implemented  Yes, yellow  Treat  MEWS Interventions Considered administering scheduled or prn medications/treatments as ordered  Take Vital Signs  Increase Vital Sign Frequency  Yellow: Q2hr x1, continue Q4hrs until patient remains green for 12hrs  Escalate  MEWS: Escalate Yellow: Discuss with charge nurse and consider notifying provider and/or RRT  Notify: Charge Nurse/RN  Name of Charge Nurse/RN Notified Fred  Assess: SIRS CRITERIA  SIRS Temperature  0  SIRS Pulse 1  SIRS Respirations  0  SIRS WBC 1  SIRS Score Sum  2

## 2023-05-21 ENCOUNTER — Inpatient Hospital Stay (HOSPITAL_COMMUNITY): Payer: Medicare Other

## 2023-05-21 DIAGNOSIS — I5042 Chronic combined systolic (congestive) and diastolic (congestive) heart failure: Secondary | ICD-10-CM | POA: Diagnosis not present

## 2023-05-21 DIAGNOSIS — I4891 Unspecified atrial fibrillation: Secondary | ICD-10-CM | POA: Diagnosis not present

## 2023-05-21 DIAGNOSIS — I10 Essential (primary) hypertension: Secondary | ICD-10-CM | POA: Diagnosis not present

## 2023-05-21 DIAGNOSIS — E1142 Type 2 diabetes mellitus with diabetic polyneuropathy: Secondary | ICD-10-CM | POA: Diagnosis not present

## 2023-05-21 LAB — CBC
HCT: 34.9 % — ABNORMAL LOW (ref 39.0–52.0)
Hemoglobin: 11.9 g/dL — ABNORMAL LOW (ref 13.0–17.0)
MCH: 34.1 pg — ABNORMAL HIGH (ref 26.0–34.0)
MCHC: 34.1 g/dL (ref 30.0–36.0)
MCV: 100 fL (ref 80.0–100.0)
Platelets: 104 10*3/uL — ABNORMAL LOW (ref 150–400)
RBC: 3.49 MIL/uL — ABNORMAL LOW (ref 4.22–5.81)
RDW: 16.9 % — ABNORMAL HIGH (ref 11.5–15.5)
WBC: 10.6 10*3/uL — ABNORMAL HIGH (ref 4.0–10.5)
nRBC: 1.7 % — ABNORMAL HIGH (ref 0.0–0.2)

## 2023-05-21 LAB — HEPATIC FUNCTION PANEL
ALT: 61 U/L — ABNORMAL HIGH (ref 0–44)
AST: 24 U/L (ref 15–41)
Albumin: 2.3 g/dL — ABNORMAL LOW (ref 3.5–5.0)
Alkaline Phosphatase: 161 U/L — ABNORMAL HIGH (ref 38–126)
Bilirubin, Direct: 0.3 mg/dL — ABNORMAL HIGH (ref 0.0–0.2)
Indirect Bilirubin: 0.8 mg/dL (ref 0.3–0.9)
Total Bilirubin: 1.1 mg/dL (ref <1.2)
Total Protein: 5.2 g/dL — ABNORMAL LOW (ref 6.5–8.1)

## 2023-05-21 LAB — BASIC METABOLIC PANEL
Anion gap: 17 — ABNORMAL HIGH (ref 5–15)
BUN: 69 mg/dL — ABNORMAL HIGH (ref 8–23)
CO2: 18 mmol/L — ABNORMAL LOW (ref 22–32)
Calcium: 8.7 mg/dL — ABNORMAL LOW (ref 8.9–10.3)
Chloride: 90 mmol/L — ABNORMAL LOW (ref 98–111)
Creatinine, Ser: 5.36 mg/dL — ABNORMAL HIGH (ref 0.61–1.24)
GFR, Estimated: 10 mL/min — ABNORMAL LOW (ref >60)
Glucose, Bld: 267 mg/dL — ABNORMAL HIGH (ref 70–99)
Potassium: 4.7 mmol/L (ref 3.5–5.1)
Sodium: 125 mmol/L — ABNORMAL LOW (ref 135–145)

## 2023-05-21 LAB — GLUCOSE, CAPILLARY
Glucose-Capillary: 256 mg/dL — ABNORMAL HIGH (ref 70–99)
Glucose-Capillary: 377 mg/dL — ABNORMAL HIGH (ref 70–99)
Glucose-Capillary: 183 mg/dL — ABNORMAL HIGH (ref 70–99)
Glucose-Capillary: 92 mg/dL (ref 70–99)
Glucose-Capillary: 220 mg/dL — ABNORMAL HIGH (ref 70–99)

## 2023-05-21 LAB — LACTIC ACID, PLASMA: Lactic Acid, Venous: 2.3 mmol/L (ref 0.5–1.9)

## 2023-05-21 MED ORDER — ALBUMIN HUMAN 25 % IV SOLN
25.0000 g | Freq: Once | INTRAVENOUS | Status: AC
  Administered 2023-05-21: 25 g via INTRAVENOUS

## 2023-05-21 MED ORDER — LIDOCAINE-PRILOCAINE 2.5-2.5 % EX CREA
1.0000 | TOPICAL_CREAM | CUTANEOUS | Status: DC | PRN
Start: 2023-05-21 — End: 2023-05-21

## 2023-05-21 MED ORDER — ALTEPLASE 2 MG IJ SOLR: 2.0000 mg | Freq: Once | INTRAMUSCULAR | Status: DC | PRN

## 2023-05-21 MED ORDER — PENTAFLUOROPROP-TETRAFLUOROETH EX AERO
1.0000 | INHALATION_SPRAY | CUTANEOUS | Status: DC | PRN
Start: 2023-05-21 — End: 2023-05-21

## 2023-05-21 MED ORDER — MIDODRINE HCL 5 MG PO TABS
10.0000 mg | ORAL_TABLET | Freq: Once | ORAL | Status: AC
  Administered 2023-05-21: 10 mg via ORAL

## 2023-05-21 MED ORDER — PROSOURCE PLUS PO LIQD
30.0000 mL | Freq: Two times a day (BID) | ORAL | Status: DC
  Administered 2023-05-21 – 2023-05-30 (×14): 30 mL via ORAL
  Filled 2023-05-21 (×14): qty 30

## 2023-05-21 MED ORDER — HEPARIN SODIUM (PORCINE) 1000 UNIT/ML DIALYSIS
1000.0000 [IU] | INTRAMUSCULAR | Status: DC | PRN
Start: 2023-05-21 — End: 2023-05-21

## 2023-05-21 MED ORDER — ANTICOAGULANT SODIUM CITRATE 4% (200MG/5ML) IV SOLN
5.0000 mL | Status: DC | PRN
Start: 2023-05-21 — End: 2023-05-21

## 2023-05-21 MED ORDER — LIDOCAINE HCL (PF) 1 % IJ SOLN
5.0000 mL | INTRAMUSCULAR | Status: DC | PRN
Start: 2023-05-21 — End: 2023-05-21

## 2023-05-21 NOTE — Progress Notes (Signed)
Progress Note  Patient Name: Robert Lucas Date of Encounter: 05/21/2023 Primary Cardiologist: Elder Negus, MD   Subjective   Seen in dialysis.  In good spirits, no chest pain and no shortness of breath.  Vital Signs    Vitals:   05/21/23 1130 05/21/23 1145 05/21/23 1200 05/21/23 1215  BP: (!) 95/57     Pulse: (!) 32  (!) 118 (!) 105  Resp: 13 18 18  (!) 24  Temp:      TempSrc:      SpO2: (!) 28%  100% 100%  Weight:      Height:        Intake/Output Summary (Last 24 hours) at 05/21/2023 1254 Last data filed at 05/21/2023 0832 Gross per 24 hour  Intake 524.1 ml  Output --  Net 524.1 ml   Filed Weights   05/19/23 1140 05/20/23 0428 05/21/23 0504  Weight: 72.6 kg 74.7 kg 76.6 kg    Physical Exam   GEN: No acute distress.   Neck: + JVD Cardiac: iRRR, no murmurs, rubs, or gallops.  Respiratory: Crackles bilaterally GI: Soft, nontender, distended  MS: + edema  Labs   Telemetry: AF RVR Rates ~ 110 bpm   Chemistry Recent Labs  Lab 05/18/23 1820 05/19/23 0848 05/20/23 0332 05/21/23 0433 05/21/23 0445  NA 134* 132* 130* 125*  --   K 4.5 5.1 4.6 4.7  --   CL 93* 95* 95* 90*  --   CO2 26 22 20* 18*  --   GLUCOSE 198* 224* 163* 267*  --   BUN 27* 39* 52* 69*  --   CREATININE 2.38* 3.25* 4.06* 5.36*  --   CALCIUM 9.0 8.5* 8.9 8.7*  --   PROT 6.2* 5.4*  --   --  5.2*  ALBUMIN 3.0* 2.5*  --   --  2.3*  AST 57* 51*  --   --  24  ALT 93* 78*  --   --  61*  ALKPHOS 197* 157*  --   --  161*  BILITOT 2.0* 1.9*  --   --  1.1  GFRNONAA 26* 18* 14* 10*  --   ANIONGAP 15 15 15  17*  --      Hematology Recent Labs  Lab 05/19/23 0848 05/20/23 0332 05/21/23 0445  WBC 11.4* 15.1* 10.6*  RBC 3.39* 3.60* 3.49*  HGB 11.8* 12.3* 11.9*  HCT 35.1* 36.4* 34.9*  MCV 103.5* 101.1* 100.0  MCH 34.8* 34.2* 34.1*  MCHC 33.6 33.8 34.1  RDW 17.0* 16.9* 16.9*  PLT 121* 112* 104*    Cardiac EnzymesNo results for input(s): "TROPONINI" in the last 168 hours. No  results for input(s): "TROPIPOC" in the last 168 hours.   BNP Recent Labs  Lab 05/19/23 0024  BNP 2,343.1*     DDimer No results for input(s): "DDIMER" in the last 168 hours.   Cardiac Studies   Cardiac Studies & Procedures       ECHOCARDIOGRAM  PCV ECHOCARDIOGRAM COMPLETE 01/24/2022  Narrative Echocardiogram 01/24/2022: Left ventricle cavity is normal in size. Mild concentric hypertrophy of the left ventricle.  Mild global and severe apical hypokinesis. LVEF 45-50%. LVEF 45-50%. Doppler evidence of grade I (impaired) diastolic dysfunction, normal LAP. Left atrial cavity is moderately dilated. Trileaflet aortic valve with mild calcification. Trace aortic valve stenosis. Vmax 1.9 m/sec, mean PG 8 mmHg, AVA 1.7 cm by continuity equation. No regurgitation. Mild calcification of the mitral valve annulus. Trace mitral regurgitation. Trace tricuspid regurgitation. Estimated pulmonary  artery systolic pressure 25 Previous study on 11/15/2020, reported grade II diastolic dysfunction, estimated PASP 41 mmHg.    MONITORS  LONG TERM MONITOR (3-14 DAYS) 04/06/2022  Narrative Mobile cardiac telemetry 13 days 03/16/2022 - 03/30/2022: Dominant rhythm: Sinus. HR 51-113 bpm. Avg HR 75 bpm, in sinus rhythm. >4000 episodes of probable atrial tachycardia, fastest at 200 bpm, avg 128 bpm, longest for 46 secs. 8.6% isolated SVE, 4.0% couplets, 2.0% triplets. 815 episodes of VT, fastest at 226 bpm for 5 beats, longest for 18 beats at 174 bpm. 5.7% isolated VE, <1% couplet/triplets. No atrial fibrillation/atrial flutter/high grade AV block, sinus pause >3sec noted. 0 patient triggered events.            Assessment & Plan   Atrial Fibrillation with Rapid Ventricular Response (Afib, RVR; new onset) - Continue IV amiodarone. Pt has limited good options for rate control.    Acute on Chronic HF - volume status with HD removal - cannot tolerate GDMT (Hypotension and ESRD) - if rare are difficult to  control despite HD Monday and Tuesday, consider TEE/DCCV, ?11/20  Hypotension - Experienced hypotension, HD RN says run going well with no limitation yet. - midodrine to TID. Received albumin   For questions or updates, please contact [facility] Please consult [email or web address] for contact info under Cardiology/STEMI.

## 2023-05-21 NOTE — Progress Notes (Signed)
Heart Failure Navigator Progress Note  Assessed for Heart & Vascular TOC clinic readiness.  Patient does not meet criteria due to ESRD on hemodialysis.   Navigator will sign off at this time.    Dawn Fields, BSN, RN Heart Failure Nurse Navigator Secure Chat Only   

## 2023-05-21 NOTE — Discharge Instructions (Signed)
Information on my medicine - ELIQUIS (apixaban)  This medication education was reviewed with me or my healthcare representative as part of my discharge preparation.     Why was Eliquis prescribed for you? Eliquis was prescribed for you to reduce the risk of a blood clot forming that can cause a stroke if you have a medical condition called atrial fibrillation (a type of irregular heartbeat).  What do You need to know about Eliquis ? Take your Eliquis TWICE DAILY - one tablet in the morning and one tablet in the evening with or without food. If you have difficulty swallowing the tablet whole please discuss with your pharmacist how to take the medication safely.  Take Eliquis exactly as prescribed by your doctor and DO NOT stop taking Eliquis without talking to the doctor who prescribed the medication.  Stopping may increase your risk of developing a stroke.  Refill your prescription before you run out.  After discharge, you should have regular check-up appointments with your healthcare provider that is prescribing your Eliquis.  In the future your dose may need to be changed if your kidney function or weight changes by a significant amount or as you get older.  What do you do if you miss a dose? If you miss a dose, take it as soon as you remember on the same day and resume taking twice daily.  Do not take more than one dose of ELIQUIS at the same time to make up a missed dose.  Important Safety Information A possible side effect of Eliquis is bleeding. You should call your healthcare provider right away if you experience any of the following: Bleeding from an injury or your nose that does not stop. Unusual colored urine (red or dark brown) or unusual colored stools (red or black). Unusual bruising for unknown reasons. A serious fall or if you hit your head (even if there is no bleeding).  Some medicines may interact with Eliquis and might increase your risk of bleeding or clotting  while on Eliquis. To help avoid this, consult your healthcare provider or pharmacist prior to using any new prescription or non-prescription medications, including herbals, vitamins, non-steroidal anti-inflammatory drugs (NSAIDs) and supplements.  This website has more information on Eliquis (apixaban): http://www.eliquis.com/eliquis/home =======================================  Atrial Fibrillation    Atrial fibrillation is a type of heartbeat that is irregular or fast. If you have this condition, your heart beats without any order. This makes it hard for your heart to pump blood in a normal way. Atrial fibrillation may come and go, or it may become a long-lasting problem. If this condition is not treated, it can put you at higher risk for stroke, heart failure, and other heart problems.  What are the causes? This condition may be caused by diseases that damage the heart. They include: High blood pressure. Heart failure. Heart valve disease. Heart surgery. Other causes include: Diabetes. Thyroid disease. Being overweight. Kidney disease. Sometimes the cause is not known.  What increases the risk? You are more likely to develop this condition if: You are older. You smoke. You exercise often and very hard. You have a family history of this condition. You are a man. You use drugs. You drink a lot of alcohol. You have lung conditions, such as emphysema, pneumonia, or COPD. You have sleep apnea.  What are the signs or symptoms? Common symptoms of this condition include: A feeling that your heart is beating very fast. Chest pain or discomfort. Feeling short of breath. Suddenly feeling   light-headed or weak. Getting tired easily during activity. Fainting. Sweating. In some cases, there are no symptoms.  How is this treated? Treatment for this condition depends on underlying conditions and how you feel when you have atrial fibrillation. They include: Medicines to: Prevent  blood clots. Treat heart rate or heart rhythm problems. Using devices, such as a pacemaker, to correct heart rhythm problems. Doing surgery to remove the part of the heart that sends bad signals. Closing an area where clots can form in the heart (left atrial appendage). In some cases, your doctor will treat other underlying conditions.  Follow these instructions at home:  Medicines Take over-the-counter and prescription medicines only as told by your doctor. Do not take any new medicines without first talking to your doctor. If you are taking blood thinners: Talk with your doctor before you take any medicines that have aspirin or NSAIDs, such as ibuprofen, in them. Take your medicine exactly as told by your doctor. Take it at the same time each day. Avoid activities that could hurt or bruise you. Follow instructions about how to prevent falls. Wear a bracelet that says you are taking blood thinners. Or, carry a card that lists what medicines you take. Lifestyle         Do not use any products that have nicotine or tobacco in them. These include cigarettes, e-cigarettes, and chewing tobacco. If you need help quitting, ask your doctor. Eat heart-healthy foods. Talk with your doctor about the right eating plan for you. Exercise regularly as told by your doctor. Do not drink alcohol. Lose weight if you are overweight. Do not use drugs, including cannabis.  General instructions If you have a condition that causes breathing to stop for a short period of time (apnea), treat it as told by your doctor. Keep a healthy weight. Do not use diet pills unless your doctor says they are safe for you. Diet pills may make heart problems worse. Keep all follow-up visits as told by your doctor. This is important.  Contact a doctor if: You notice a change in the speed, rhythm, or strength of your heartbeat. You are taking a blood-thinning medicine and you get more bruising. You get tired more easily  when you move or exercise. You have a sudden change in weight.  Get help right away if:    You have pain in your chest or your belly (abdomen). You have trouble breathing. You have side effects of blood thinners, such as blood in your vomit, poop (stool), or pee (urine), or bleeding that cannot stop. You have any signs of a stroke. "BE FAST" is an easy way to remember the main warning signs: B - Balance. Signs are dizziness, sudden trouble walking, or loss of balance. E - Eyes. Signs are trouble seeing or a change in how you see. F - Face. Signs are sudden weakness or loss of feeling in the face, or the face or eyelid drooping on one side. A - Arms. Signs are weakness or loss of feeling in an arm. This happens suddenly and usually on one side of the body. S - Speech. Signs are sudden trouble speaking, slurred speech, or trouble understanding what people say. T - Time. Time to call emergency services. Write down what time symptoms started. You have other signs of a stroke, such as: A sudden, very bad headache with no known cause. Feeling like you may vomit (nausea). Vomiting. A seizure.  These symptoms may be an emergency. Do not wait to   see if the symptoms will go away. Get medical help right away. Call your local emergency services (911 in the [place] Do not drive yourself to the hospital. Summary Atrial fibrillation is a type of heartbeat that is irregular or fast. You are at higher risk of this condition if you smoke, are older, have diabetes, or are overweight. Follow your doctor's instructions about medicines, diet, exercise, and follow-up visits. Get help right away if you have signs or symptoms of a stroke. Get help right away if you cannot catch your breath, or you have chest pain or discomfort. This information is not intended to replace advice given to you by your health care provider. Make sure you discuss any questions you have with your health care provider. Document  Revised: 12/11/2018 Document Reviewed: 12/11/2018 Elsevier Patient Education  2020 Elsevier Inc.    

## 2023-05-21 NOTE — TOC Initial Note (Signed)
Transition of Care [facility]) - Initial/Assessment Note    Patient Details  Name: Robert Lucas MRN: [id number] Date of Birth: 1940-08-29  Transition of Care [facility]) CM/SW Contact:    Leone Haven, RN Phone Number: 05/21/2023, 4:09 PM  Clinical Narrative:                 From home with spouse, has PCP and insurance on file, states has no HH services in place at this time, has walker and cane at home.   States wife will transport him home at [employer] and she is support system, states gets medications from [facility] in [place].  Pta self ambulatory .  NCM offered choice for HHPT, HHOT, patient refused states does not want HH services.   Expected Discharge Plan: Home w Home Health Services Barriers to Discharge: Continued Medical Work up   Patient Goals and CMS Choice Patient states their goals for this hospitalization and ongoing recovery are:: return home CMS Medicare.gov Compare Post Acute Care list provided to:: Patient Choice offered to / list presented to : Patient      Expected Discharge Plan and Services In-house Referral: NA Discharge Planning Services: CM Consult Post Acute Care Choice: NA Living arrangements for the past 2 months: Single Family Home                 DME Arranged: N/A DME Agency: NA       HH Arranged: PT, OT, Patient Refused HH          Prior Living Arrangements/Services Living arrangements for the past 2 months: Single Family Home Lives with:: Spouse Patient language and need for interpreter reviewed:: Yes Do you feel safe going back to the place where you live?: Yes      Need for Family Participation in Patient Care: Yes (Comment) Care giver support system in place?: Yes (comment) Current home services: DME (cane , walker) Criminal Activity/Legal Involvement Pertinent to Current Situation/Hospitalization: No - Comment as needed  Activities of Daily Living   ADL Screening (condition at time of admission) Independently performs ADLs?:  No Does the patient have a NEW difficulty with bathing/dressing/toileting/self-feeding that is expected to last >3 days?: Yes (Initiates electronic notice to provider for possible OT consult) Does the patient have a NEW difficulty with getting in/out of bed, walking, or climbing stairs that is expected to last >3 days?: Yes (Initiates electronic notice to provider for possible PT consult) Does the patient have a NEW difficulty with communication that is expected to last >3 days?: No Is the patient deaf or have difficulty hearing?: No Does the patient have difficulty seeing, even when wearing glasses/contacts?: No Does the patient have difficulty concentrating, remembering, or making decisions?: No  Permission Sought/Granted Permission sought to share information with : Case Manager Permission granted to share information with : Yes, Verbal Permission Granted              Emotional Assessment Appearance:: Appears stated age Attitude/Demeanor/Rapport: Engaged Affect (typically observed): Appropriate Orientation: : Oriented to Self, Oriented to Place, Oriented to  Time, Oriented to Situation Alcohol / Substance Use: Not Applicable Psych Involvement: No (comment)  Admission diagnosis:  Atrial fibrillation with RVR (HCC) [I48.91] Atrial fibrillation, unspecified type (HCC) [I48.91] Chronic kidney disease, unspecified CKD stage [N18.9] Patient Active Problem List   Diagnosis Date Noted   Atrial fibrillation with RVR (HCC) 05/18/2023   Hypotension 05/18/2023   Severe sepsis (HCC) 12/18/2022   Acute hyponatremia 12/18/2022   Hyperkalemia 12/18/2022  Acute respiratory failure with hypoxia (HCC) 12/17/2022   Atrial tachycardia (HCC) 06/20/2022   NSVT (nonsustained ventricular tachycardia) (HCC) 06/20/2022   ESRD (end stage renal disease) (HCC) 03/16/2022   Type 2 diabetes mellitus with obesity (HCC) 10/18/2021   Acute respiratory failure with hypoxia and hypercapnia (HCC)    Acute  metabolic encephalopathy    Aspiration pneumonia of both lower lobes due to vomit (HCC)    OSA (obstructive sleep apnea) 10/14/2021   Obesity hypoventilation syndrome (HCC) 10/14/2021   Fluid overload 10/13/2021   Hypothermia 10/13/2021   ARF (acute renal failure) (HCC) 10/13/2021   CKD (chronic kidney disease), stage V (HCC) 09/21/2021   History of anemia due to chronic kidney disease 08/27/2021   Chronic combined systolic and diastolic CHF (congestive heart failure) (HCC) 08/25/2021   Acute renal failure superimposed on stage 4 chronic kidney disease (HCC) 08/25/2021   Stage 3 chronic kidney disease due to type 2 diabetes mellitus (HCC) 07/01/2021   Age-related nuclear cataract, bilateral 07/01/2021   Allergic rhinitis 07/01/2021   Allergic rhinitis due to pollen 07/01/2021   Arthropathy 07/01/2021   Calculus of gallbladder without cholecystitis without obstruction 07/01/2021   Chronic kidney disease, stage 4 (severe) (HCC) 07/01/2021   Constipation 07/01/2021   Diabetic renal disease (HCC) 07/01/2021   Enlarged prostate 07/01/2021   Gallbladder calculus with acute cholecystitis and no obstruction 07/01/2021   Hypertensive heart disease without congestive heart failure 07/01/2021   Hypertrophy of prostate with urinary obstruction and other lower urinary tract symptoms (LUTS) 07/01/2021   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 associated with type 2 diabetes mellitus (HCC) 07/01/2021   Obstructive sleep apnea syndrome 07/01/2021   Old myocardial infarction 07/01/2021   Other specified counseling 07/01/2021   Postherpetic neuralgia 07/01/2021   Postsurgical percutaneous transluminal coronary angioplasty status 07/01/2021   Presence of aortocoronary bypass graft 07/01/2021   Proteinuria 07/01/2021   Senile purpura (HCC) 07/01/2021   Vitamin D deficiency 07/01/2021   HFrEF (heart failure with reduced ejection fraction) (HCC) 07/12/2020   Lobar  pneumonia (HCC) 07/10/2020   CAP (community acquired pneumonia) 07/08/2020   Complication of surgical procedure 09/08/2019   Lumbar radiculopathy 09/08/2019   Degeneration of lumbar intervertebral disc 08/27/2019   Pain of left hip joint 07/17/2019   Obese 05/07/2019   S/P right THA, AA 05/06/2019   Coronary artery disease involving native coronary artery of native heart without angina pectoris 01/28/2019   Leg edema 01/28/2019   Pain in joint of right hip 11/19/2018   Complete tear of rotator cuff 04/23/2013   Chest pain 10/13/2012   DM2 (diabetes mellitus, type 2) (HCC) 10/13/2012   Essential hypertension 10/13/2012   Hyperlipidemia 10/13/2012   Gout 10/13/2012   Bell's palsy 10/13/2012   Left-sided weakness 10/13/2012   PCP:  Joycelyn Rua, MD Pharmacy:   [facility]) ELECTRON[facility] - [place], [place] - [address] [place] [address] Phone: [phone] Fax: [phone]  [facility]) - [place], [place] - [address] [place] [address] [place]  [address] Phone: [phone] Fax: [phone]  [facility] - [place], [place] - [address] [place] [address] Phone: [phone] Fax: [phone]  [facility] [address] [place] [address] Phone: [phone] Fax: [phone]     Social Determinants of Health (SDOH) Social History: SDOH Screenings   Food Insecurity: No Food  Insecurity (05/19/2023)  Housing: Low Risk  (05/19/2023)  Transportation Needs: No Transportation Needs (05/19/2023)  Utilities: Not At Risk (05/19/2023)  Tobacco Use: Medium Risk (05/19/2023)   SDOH Interventions:     Readmission Risk Interventions    05/21/2023    4:06 PM 12/20/2022    1:38 PM  Readmission Risk Prevention Plan  Transportation Screening Complete Complete  PCP  or Specialist Appt within 5-7 Days  Complete  PCP or Specialist Appt within 3-5 Days Complete   Home Care Screening  Complete  Medication Review (RN CM)  Complete  HRI or Home Care Consult Complete   Palliative Care Screening Not Applicable   Medication Review (RN Care Manager) Complete

## 2023-05-21 NOTE — Progress Notes (Signed)
[facility]                                                                               Robert Lucas, is a 82 y.o. male, DOB - 01-29-1941, QMV:[id number] Admit date - 05/18/2023    Outpatient Primary MD for the patient is Joycelyn Rua, MD  LOS - 2  days    Brief summary    Robert Lucas is a 82 y.o. male with medical history significant of ESRD on HD MWF ,  DM2, systolic/diastolic heart failure ef 30-35%, HLD, anemia, CAD, HTN, hx of MI, gout, Presented with hypotension, atrial fibrillation with RVR. He was started on IV amiodarone and  IV heparin.   Assessment & Plan    Assessment and Plan:  Atrial fibrillation with with RVR: Rate not well controlled.  Initially started on IV Cardizem transitioned to  amiodarone gtt. He remains in afib with RVR.  Cardiology on board, transitioned to eliquis today.  Tsh is wnl.  Echocardiogram  ordered and pending.   Acute on chronic combined CHF: - Fluid management with HD. Nephrology on board.  - on midodrine 5 mg TID.   Hypotension.  Still borderline low.    Diabetes Mellitus type 2: CBG (last 3)  Recent Labs    05/21/23 0413 05/21/23 0747 05/21/23 1527  GLUCAP 256* 183* 92   Uncontrolled with hyperglycemia probably from steroids.  Continue with Novolin N 10 units daily.  A1c is 8.8   ESRD on HD Nephrology on board and appreciate recommendations.    Hyperlipidemia Resume statin.    BPH Continue with flomax.    Hyponatremia 125 from fluid overload.   Elevated liver enzymes ? CHF Increased liver echotexture consistent with hepatic steatosis.  Improved. Bilirubin wnl.  Alk phos is 161,  Monitor.    Mild leukocytosis   Elevated lactic acid  - possibly from CHF.    Estimated body mass index is 31.33 kg/m as calculated from the following:   Height as of this encounter: 5\' 2"  (1.575 m).   Weight as of this encounter: 77.7 kg.  Code Status: full code.  DVT Prophylaxis:  apixaban  (ELIQUIS) tablet 2.5 mg Start: 05/20/23 1245 SCDs Start: 05/19/23 0549 apixaban (ELIQUIS) tablet 2.5 mg   Level of Care: Level of care: Progressive Family Communication: none at bedside.   Disposition Plan:     Remains inpatient appropriate:  pending   Procedures:  Echocardiogram.   Consultants:   Cardiology.  Nephrology.  Antimicrobials:   Anti-infectives (From admission, onward)    None        Medications  Scheduled Meds:  (feeding supplement) PROSource Plus  30 mL Oral BID BM   allopurinol  100 mg Oral q AM   apixaban  2.5 mg Oral BID   atorvastatin  40 mg Oral QHS   Chlorhexidine Gluconate Cloth  6 each Topical Q0600   gabapentin  300 mg Oral QHS   insulin aspart  0-6 Units Subcutaneous Q4H   insulin NPH Human  10 Units Subcutaneous QAC breakfast   midodrine  10 mg Oral TID WC   sodium chloride flush  3 mL Intravenous Q12H   tamsulosin  0.4 mg Oral QHS   Continuous Infusions:  amiodarone 30 mg/hr (05/21/23 1015)   PRN Meds:.acetaminophen **OR** acetaminophen, ondansetron **OR** ondansetron (ZOFRAN) IV, sodium chloride flush    Subjective:   Robert Lucas was seen and examined today.  Off oxygen , in good spirits.   Objective:   Vitals:   05/21/23 1215 05/21/23 1319 05/21/23 1327 05/21/23 1505  BP:  110/79  102/65  Pulse: (!) 105 (!) 111  (!) 119  Resp: (!) 24 16  20   Temp:  (!) 97.3 F (36.3 C)  97.7 F (36.5 C)  TempSrc:  Axillary  Oral  SpO2: 100% 100%  100%  Weight:   77.7 kg   Height:        Intake/Output Summary (Last 24 hours) at 05/21/2023 1721 Last data filed at 05/21/2023 1319 Gross per 24 hour  Intake 404.1 ml  Output 2300 ml  Net -1895.9 ml   Filed Weights   05/20/23 0428 05/21/23 0504 05/21/23 1327  Weight: 74.7 kg 76.6 kg 77.7 kg     Exam General exam: Appears calm and comfortable  Respiratory system: air entry fair.  Cardiovascular system: S1 & S2 heard, RRR. No JVD, Gastrointestinal system: Abdomen is soft,  bs+ Central nervous system: Alert and oriented. Extremities: pedal edema.  Skin: No rashes,      Data Reviewed:  I have personally reviewed following labs and imaging studies   CBC Lab Results  Component Value Date   WBC 10.6 (H) 05/21/2023   RBC 3.49 (L) 05/21/2023   HGB 11.9 (L) 05/21/2023   HCT 34.9 (L) 05/21/2023   MCV 100.0 05/21/2023   MCH 34.1 (H) 05/21/2023   PLT 104 (L) 05/21/2023   MCHC 34.1 05/21/2023   RDW 16.9 (H) 05/21/2023   LYMPHSABS 1.3 12/18/2022   MONOABS 0.9 12/18/2022   EOSABS 0.0 12/18/2022   BASOSABS 0.0 12/18/2022     Last metabolic panel Lab Results  Component Value Date   NA 125 (L) 05/21/2023   K 4.7 05/21/2023   CL 90 (L) 05/21/2023   CO2 18 (L) 05/21/2023   BUN 69 (H) 05/21/2023   CREATININE 5.36 (H) 05/21/2023   GLUCOSE 267 (H) 05/21/2023   GFRNONAA 10 (L) 05/21/2023   GFRAA 26 (L) 05/07/2019   CALCIUM 8.7 (L) 05/21/2023   PHOS 6.5 (H) 05/19/2023   PROT 5.2 (L) 05/21/2023   ALBUMIN 2.3 (L) 05/21/2023   BILITOT 1.1 05/21/2023   ALKPHOS 161 (H) 05/21/2023   AST 24 05/21/2023   ALT 61 (H) 05/21/2023   ANIONGAP 17 (H) 05/21/2023    CBG (last 3)  Recent Labs    05/21/23 0413 05/21/23 0747 05/21/23 1527  GLUCAP 256* 183* 92      Coagulation Profile: Recent Labs  Lab 05/19/23 0024  INR 1.2     Radiology Studies: No results found.     Kathlen Mody M.D. [facility] 05/21/2023, 5:21 PM  Available via Epic secure chat 7am-7pm After 7 pm, please refer to night coverage provider listed on amion.

## 2023-05-21 NOTE — Progress Notes (Signed)
2D echo attempted, patient went to dialysis. Will try later

## 2023-05-21 NOTE — Progress Notes (Signed)
[facility] Progress Note   Subjective: Seen on HD, BP tanking down to 80s. Increase midodrine dose and back off UFG a bit.  He is fortunately asymptomatic, does need volume removed but need to proceed safely. AFib on monitor, loss of atrial kick contributing to hypotension.     Objective Vitals:   05/21/23 0019 05/21/23 0411 05/21/23 0504 05/21/23 0735  BP: 116/81 106/65  99/62  Pulse: 85 (!) 106  (!) 104  Resp: 20 18  20   Temp: (!) 97.4 F (36.3 C) 97.9 F (36.6 C)  97.7 F (36.5 C)  TempSrc: Oral Oral  Oral  SpO2: 100% 94%  94%  Weight:   76.6 kg   Height:       Physical Exam General: Chronically ill appearing male in NAD Heart: irreg, irreg No M/R/G. Afib on monitor. HR 100s Lungs: decreased in bases with few bibasilar crackles. No WOB Abdomen: obese, NABS Extremities: 2+ Pedal edema present, edema in abdomen Dialysis Access: AVG cannulated   Additional Objective Labs: Basic Metabolic Panel: Recent Labs  Lab 05/18/23 1820 05/19/23 0024 05/19/23 0848 05/20/23 0332  NA 134*  --  132* 130*  K 4.5  --  5.1 4.6  CL 93*  --  95* 95*  CO2 26  --  22 20*  GLUCOSE 198*  --  224* 163*  BUN 27*  --  39* 52*  CREATININE 2.38*  --  3.25* 4.06*  CALCIUM 9.0  --  8.5* 8.9  PHOS  --  6.2* 6.5*  --    Liver Function Tests: Recent Labs  Lab 05/18/23 1820 05/19/23 0848 05/21/23 0445  AST 57* 51* 24  ALT 93* 78* 61*  ALKPHOS 197* 157* 161*  BILITOT 2.0* 1.9* 1.1  PROT 6.2* 5.4* 5.2*  ALBUMIN 3.0* 2.5* 2.3*   No results for input(s): "LIPASE", "AMYLASE" in the last 168 hours. CBC: Recent Labs  Lab 05/18/23 1820 05/19/23 0848 05/20/23 0332 05/21/23 0445  WBC 10.5 11.4* 15.1* 10.6*  HGB 13.2 11.8* 12.3* 11.9*  HCT 39.7 35.1* 36.4* 34.9*  MCV 101.0* 103.5* 101.1* 100.0  PLT 123* 121* 112* 104*   Blood Culture    Component Value Date/Time   SDES BLOOD RIGHT HAND 12/18/2022 0716   SPECREQUEST  12/18/2022 0716    BOTTLES DRAWN AEROBIC ONLY  Blood Culture results may not be optimal due to an inadequate volume of blood received in culture bottles   CULT  12/18/2022 0716    NO GROWTH 5 DAYS Performed at [facility], [address] [place], [place] [address]    REPTSTATUS 12/23/2022 FINAL 12/18/2022 0716    Cardiac Enzymes: No results for input(s): "CKTOTAL", "CKMB", "CKMBINDEX", "TROPONINI" in the last 168 hours. CBG: Recent Labs  Lab 05/20/23 1532 05/20/23 2006 05/21/23 0021 05/21/23 0413 05/21/23 0747  GLUCAP 303* 363* 377* 256* 183*   Iron Studies:  Recent Labs    05/19/23 0024  IRON 155  TIBC 238*  FERRITIN 1,620*   @lablastinr3 @ Studies/Results: No results found. Medications:  amiodarone 30 mg/hr (05/21/23 0000)   anticoagulant sodium citrate      allopurinol  100 mg Oral q AM   apixaban  2.5 mg Oral BID   atorvastatin  40 mg Oral QHS   Chlorhexidine Gluconate Cloth  6 each Topical Q0600   gabapentin  300 mg Oral QHS   insulin aspart  0-6 Units Subcutaneous Q4H   insulin NPH Human  10 Units Subcutaneous QAC breakfast   midodrine  10 mg Oral TID WC   sodium chloride flush  3 mL Intravenous Q12H   tamsulosin  0.4 mg Oral QHS     HD orders:  NW MWF 400/auto 1.5 3K/2.5Ca EDW 68.1 4h Calcitriol 1.25 three times per week No ESA   Assessment/Plan: Robert Lucas is an 82 y.o. male with ESRD on HD MWF, HTN, DM, CAD, combined CHF (2023 grade 1 DD and EF 45%) currently hospitalized with new A fib complicated by hypotension and nephrology is consulted for evaluation and management of ESRD.    A fib with RVR complicated by hypotension:  cardiology consulting - on amio now. TTE pending.  CTA neg PE.     ESRD on HD MWF: MWF. HD today.   HFrEF with evidence of volume overload. EF 30-35% Cards following.    HTN: dx HTN at baseline, now with hypotension in setting of a fib, hold meds. On midodrine 10 mg PO TID, extra dose today. Needs volume removed but hypotension/uncontrolled AFIB impeding  progress. Give albumin 25 grams IV today today.    Anemia of CKD:  Hb in 11s, no ESA needed outpt   BMM: cont outpt calcitriol and binder doses. Renal diet.   Nutrition: Very low albumin. Add protein supplements  Cadden Elizondo H. Brantlee Penn NP-C 05/21/2023, 9:12 AM  [employer] [phone]

## 2023-05-21 NOTE — Progress Notes (Signed)
Pt receives out-pt HD at [facility] on MWF. Will assist as needed.   Tracy Mounce Renal Navigator [phone] 

## 2023-05-21 NOTE — Plan of Care (Signed)
  Problem: Education: Goal: Ability to describe self-care measures that may prevent or decrease complications (Diabetes Survival Skills Education) will improve Outcome: Progressing Goal: Individualized Educational Video(s) Outcome: Progressing   Problem: Coping: Goal: Ability to adjust to condition or change in health will improve Outcome: Progressing   Problem: Fluid Volume: Goal: Ability to maintain a balanced intake and output will improve Outcome: Progressing   Problem: Health Behavior/Discharge Planning: Goal: Ability to identify and utilize available resources and services will improve Outcome: Progressing Goal: Ability to manage health-related needs will improve Outcome: Progressing   Problem: Metabolic: Goal: Ability to maintain appropriate glucose levels will improve Outcome: Progressing   Problem: Nutritional: Goal: Maintenance of adequate nutrition will improve Outcome: Progressing Goal: Progress toward achieving an optimal weight will improve Outcome: Progressing   Problem: Skin Integrity: Goal: Risk for impaired skin integrity will decrease Outcome: Progressing   Problem: Tissue Perfusion: Goal: Adequacy of tissue perfusion will improve Outcome: Progressing   Problem: Education: Goal: Knowledge of disease or condition will improve Outcome: Progressing Goal: Understanding of medication regimen will improve Outcome: Progressing Goal: Individualized Educational Video(s) Outcome: Progressing   Problem: Activity: Goal: Ability to tolerate increased activity will improve Outcome: Progressing   Problem: Cardiac: Goal: Ability to achieve and maintain adequate cardiopulmonary perfusion will improve Outcome: Progressing   Problem: Health Behavior/Discharge Planning: Goal: Ability to safely manage health-related needs after discharge will improve Outcome: Progressing   Problem: Education: Goal: Knowledge of General Education information will  improve Description: Including pain rating scale, medication(s)/side effects and non-pharmacologic comfort measures Outcome: Progressing   Problem: Health Behavior/Discharge Planning: Goal: Ability to manage health-related needs will improve Outcome: Progressing   Problem: Clinical Measurements: Goal: Ability to maintain clinical measurements within normal limits will improve Outcome: Progressing Goal: Will remain free from infection Outcome: Progressing Goal: Diagnostic test results will improve Outcome: Progressing Goal: Respiratory complications will improve Outcome: Progressing Goal: Cardiovascular complication will be avoided Outcome: Progressing   Problem: Activity: Goal: Risk for activity intolerance will decrease Outcome: Progressing   Problem: Nutrition: Goal: Adequate nutrition will be maintained Outcome: Progressing   Problem: Coping: Goal: Level of anxiety will decrease Outcome: Progressing   Problem: Elimination: Goal: Will not experience complications related to bowel motility Outcome: Progressing Goal: Will not experience complications related to urinary retention Outcome: Progressing   Problem: Pain Management: Goal: General experience of comfort will improve Outcome: Progressing   Problem: Safety: Goal: Ability to remain free from injury will improve Outcome: Progressing   Problem: Skin Integrity: Goal: Risk for impaired skin integrity will decrease Outcome: Progressing

## 2023-05-22 ENCOUNTER — Inpatient Hospital Stay (HOSPITAL_COMMUNITY): Payer: Medicare Other

## 2023-05-22 DIAGNOSIS — I4891 Unspecified atrial fibrillation: Secondary | ICD-10-CM | POA: Diagnosis not present

## 2023-05-22 DIAGNOSIS — I10 Essential (primary) hypertension: Secondary | ICD-10-CM | POA: Diagnosis not present

## 2023-05-22 DIAGNOSIS — I5042 Chronic combined systolic (congestive) and diastolic (congestive) heart failure: Secondary | ICD-10-CM | POA: Diagnosis not present

## 2023-05-22 DIAGNOSIS — E1142 Type 2 diabetes mellitus with diabetic polyneuropathy: Secondary | ICD-10-CM | POA: Diagnosis not present

## 2023-05-22 LAB — GLUCOSE, CAPILLARY
Glucose-Capillary: 132 mg/dL — ABNORMAL HIGH (ref 70–99)
Glucose-Capillary: 171 mg/dL — ABNORMAL HIGH (ref 70–99)
Glucose-Capillary: 174 mg/dL — ABNORMAL HIGH (ref 70–99)
Glucose-Capillary: 216 mg/dL — ABNORMAL HIGH (ref 70–99)
Glucose-Capillary: 220 mg/dL — ABNORMAL HIGH (ref 70–99)
Glucose-Capillary: 305 mg/dL — ABNORMAL HIGH (ref 70–99)
Glucose-Capillary: 359 mg/dL — ABNORMAL HIGH (ref 70–99)

## 2023-05-22 LAB — CBC
HCT: 34.1 % — ABNORMAL LOW (ref 39.0–52.0)
Hemoglobin: 11.6 g/dL — ABNORMAL LOW (ref 13.0–17.0)
MCH: 34.3 pg — ABNORMAL HIGH (ref 26.0–34.0)
MCHC: 34 g/dL (ref 30.0–36.0)
MCV: 100.9 fL — ABNORMAL HIGH (ref 80.0–100.0)
Platelets: 71 10*3/uL — ABNORMAL LOW (ref 150–400)
RBC: 3.38 MIL/uL — ABNORMAL LOW (ref 4.22–5.81)
RDW: 17 % — ABNORMAL HIGH (ref 11.5–15.5)
WBC: 7.2 10*3/uL (ref 4.0–10.5)
nRBC: 1.4 % — ABNORMAL HIGH (ref 0.0–0.2)

## 2023-05-22 LAB — ECHOCARDIOGRAM COMPLETE
AR max vel: 1.17 cm2
AV Area VTI: 0.92 cm2
AV Area mean vel: 1.1 cm2
AV Mean grad: 6 mm[Hg]
AV Peak grad: 10.9 mm[Hg]
Ao pk vel: 1.65 m/s
Area-P 1/2: 5.42 cm2
Height: 62 in
S' Lateral: 5.6 cm
Weight: 2783.09 [oz_av]

## 2023-05-22 LAB — RENAL FUNCTION PANEL
Albumin: 2.1 g/dL — ABNORMAL LOW (ref 3.5–5.0)
Anion gap: 14 (ref 5–15)
BUN: 53 mg/dL — ABNORMAL HIGH (ref 8–23)
CO2: 19 mmol/L — ABNORMAL LOW (ref 22–32)
Calcium: 7.5 mg/dL — ABNORMAL LOW (ref 8.9–10.3)
Chloride: 90 mmol/L — ABNORMAL LOW (ref 98–111)
Creatinine, Ser: 3.69 mg/dL — ABNORMAL HIGH (ref 0.61–1.24)
GFR, Estimated: 15 mL/min — ABNORMAL LOW (ref >60)
Glucose, Bld: 335 mg/dL — ABNORMAL HIGH (ref 70–99)
Phosphorus: 5.3 mg/dL — ABNORMAL HIGH (ref 2.5–4.6)
Potassium: 3.6 mmol/L (ref 3.5–5.1)
Sodium: 123 mmol/L — ABNORMAL LOW (ref 135–145)

## 2023-05-22 LAB — MRSA NEXT GEN BY PCR, NASAL: MRSA by PCR Next Gen: NOT DETECTED

## 2023-05-22 MED ORDER — INSULIN ASPART 100 UNIT/ML IJ SOLN
2.0000 [IU] | Freq: Three times a day (TID) | INTRAMUSCULAR | Status: DC
  Administered 2023-05-23 – 2023-05-30 (×18): 2 [IU] via SUBCUTANEOUS

## 2023-05-22 MED ORDER — PERFLUTREN LIPID MICROSPHERE
1.0000 mL | INTRAVENOUS | Status: AC | PRN
  Administered 2023-05-22: 2 mL via INTRAVENOUS

## 2023-05-22 MED ORDER — ALBUMIN HUMAN 25 % IV SOLN
25.0000 g | Freq: Once | INTRAVENOUS | Status: AC
  Administered 2023-05-22: 25 g via INTRAVENOUS
  Filled 2023-05-22: qty 100

## 2023-05-22 MED ORDER — MIDODRINE HCL 5 MG PO TABS
10.0000 mg | ORAL_TABLET | Freq: Once | ORAL | Status: AC
  Administered 2023-05-22: 10 mg via ORAL
  Filled 2023-05-22: qty 2

## 2023-05-22 NOTE — Progress Notes (Signed)
PT Cancellation Note  Patient Details Name: LAKIN RAMSOUR MRN: [id number] DOB: 1940-11-16   Cancelled Treatment:    Reason Eval/Treat Not Completed: Other (comment). Pt working with OT and mobility earlier, now eating lunch and preparing for transport to HD for extra session. Will continue attempts.   Angelina Ok [facility] 05/22/2023, 12:12 PM Skip Mayer PT Acute [employer] [phone]

## 2023-05-22 NOTE — Progress Notes (Signed)
Occupational Therapy Treatment Patient Details Name: Robert Lucas MRN: [id number] DOB: 1940/07/20 Today's Date: 05/22/2023   History of present illness 82 y.o. male presents to [facility]/18/23 w/ hypotension and a-fib w/ RVR. Prior admission in June for sepsis due to CAP. PMHx: ESRD on HD MWF, DM2, systolic/diastolic HF EF 30-35%, HLD, anemia, CAD, HTN, hx of MI, gout   OT comments  Pt making steady progress towards OT goals. Pt able to mobilize to sink with RW though does still need assist to come to standing during session. Pt able to complete basic ADLs standing briefly at sink though does need assist for LB ADLs due to fatigue and decreased flexibility. Pt reports wife can provide light assist for LB ADLs as needed. Will continue to follow acutely.       If plan is discharge home, recommend the following:  A little help with walking and/or transfers;A little help with bathing/dressing/bathroom;Assistance with cooking/housework   Equipment Recommendations  None recommended by OT    Recommendations for Other Services      Precautions / Restrictions Precautions Precautions: Fall Precaution Comments: monitor BP, HR Restrictions Weight Bearing Restrictions: No       Mobility Bed Mobility Overal bed mobility: Needs Assistance Bed Mobility: Supine to Sit     Supine to sit: Min assist, Used rails, HOB elevated     General bed mobility comments: assist to lift trunk    Transfers Overall transfer level: Needs assistance Equipment used: Rolling walker (2 wheels) Transfers: Sit to/from Stand Sit to Stand: Min assist, Mod assist           General transfer comment: Min-Mod A for standing, pt opting to pull on RW rather than push from armrests when cued     Balance Overall balance assessment: Needs assistance Sitting-balance support: Feet supported, No upper extremity supported Sitting balance-Leahy Scale: Good     Standing balance support: Bilateral upper extremity  supported, During functional activity Standing balance-Leahy Scale: Poor                             ADL either performed or assessed with clinical judgement   ADL Overall ADL's : Needs assistance/impaired     Grooming: Supervision/safety;Standing;Oral care Grooming Details (indicate cue type and reason): standing at sink, leaning fairly  heavily on sink due to fatigue         Upper Body Dressing : Set up;Sitting   Lower Body Dressing: Minimal assistance;Sit to/from stand Lower Body Dressing Details (indicate cue type and reason): assist for socks due to fatigue and decreased flexibility. pt reports family can assist at home             Functional mobility during ADLs: Contact guard assist;Rolling walker (2 wheels)      Extremity/Trunk Assessment Upper Extremity Assessment Upper Extremity Assessment: Overall WFL for tasks assessed;Right hand dominant   Lower Extremity Assessment Lower Extremity Assessment: Defer to PT evaluation        Vision   Vision Assessment?: No apparent visual deficits   Perception     Praxis      Cognition Arousal: Alert Behavior During Therapy: WFL for tasks assessed/performed Overall Cognitive Status: Within Functional Limits for tasks assessed  Exercises      Shoulder Instructions       General Comments      Pertinent Vitals/ Pain       Pain Assessment Pain Assessment: No/denies pain  Home Living                                          Prior Functioning/Environment              Frequency  Min 1X/week        Progress Toward Goals  OT Goals(current goals can now be found in the care plan section)  Progress towards OT goals: Progressing toward goals  Acute Rehab OT Goals Patient Stated Goal: home soon OT Goal Formulation: With patient Time For Goal Achievement: 06/03/23 Potential to Achieve Goals: Good ADL Goals Pt  Will Perform Grooming: with modified independence;standing Pt Will Perform Lower Body Dressing: with modified independence;sit to/from stand Pt Will Transfer to Toilet: with modified independence;ambulating  Plan      Co-evaluation                 AM-PAC OT "6 Clicks" Daily Activity     Outcome Measure   Help from another person eating meals?: None Help from another person taking care of personal grooming?: A Little Help from another person toileting, which includes using toliet, bedpan, or urinal?: A Little Help from another person bathing (including washing, rinsing, drying)?: A Little Help from another person to put on and taking off regular upper body clothing?: A Little Help from another person to put on and taking off regular lower body clothing?: A Little 6 Click Score: 19    End of Session Equipment Utilized During Treatment: Gait belt;Rolling walker (2 wheels)  OT Visit Diagnosis: Unsteadiness on feet (R26.81);Other abnormalities of gait and mobility (R26.89);Muscle weakness (generalized) (M62.81)   Activity Tolerance Patient tolerated treatment well   Patient Left Other (comment) (with mobility specialist)   Nurse Communication Mobility status;Other (comment) (IV)        Time: 5409-8119 OT Time Calculation (min): 29 min  Charges: OT General Charges $OT Visit: 1 Visit OT Treatments $Self Care/Home Management : 8-22 mins $Therapeutic Activity: 8-22 mins  Bradd Canary, OTR/L Acute Rehab Services Office: [phone]   Lorre Munroe 05/22/2023, 9:52 AM

## 2023-05-22 NOTE — Progress Notes (Signed)
Progress Note  Patient Name: Robert Lucas Date of Encounter: 05/22/2023 Primary Cardiologist: Elder Negus, MD   Subjective   He was walking with mobility team during my visit. Ambulating with walker. Able to converse.   Vital Signs    Vitals:   05/22/23 0045 05/22/23 0411 05/22/23 0500 05/22/23 0743  BP:  103/77  (!) 105/93  Pulse:  95 (!) 113 (!) 120  Resp:  20 20 19   Temp: 97.9 F (36.6 C) 97.6 F (36.4 C)  98 F (36.7 C)  TempSrc: Oral Oral  Oral  SpO2:  90% 100% 99%  Weight: 77.2 kg 78.9 kg    Height:        Intake/Output Summary (Last 24 hours) at 05/22/2023 0947 Last data filed at 05/22/2023 0750 Gross per 24 hour  Intake 929.34 ml  Output 2300 ml  Net -1370.66 ml   Filed Weights   05/21/23 1327 05/22/23 0045 05/22/23 0411  Weight: 77.7 kg 77.2 kg 78.9 kg    Physical Exam   GEN: No acute distress.   Neck: no JVD Cardiac: iRRR, no murmurs, rubs, or gallops.  Respiratory: Crackles bilaterally GI: Soft, nontender, distended  MS: + edema  Labs   Telemetry: AF RVR Rates ~ 110 bpm   Chemistry Recent Labs  Lab 05/18/23 1820 05/19/23 0848 05/20/23 0332 05/21/23 0433 05/21/23 0445  NA 134* 132* 130* 125*  --   K 4.5 5.1 4.6 4.7  --   CL 93* 95* 95* 90*  --   CO2 26 22 20* 18*  --   GLUCOSE 198* 224* 163* 267*  --   BUN 27* 39* 52* 69*  --   CREATININE 2.38* 3.25* 4.06* 5.36*  --   CALCIUM 9.0 8.5* 8.9 8.7*  --   PROT 6.2* 5.4*  --   --  5.2*  ALBUMIN 3.0* 2.5*  --   --  2.3*  AST 57* 51*  --   --  24  ALT 93* 78*  --   --  61*  ALKPHOS 197* 157*  --   --  161*  BILITOT 2.0* 1.9*  --   --  1.1  GFRNONAA 26* 18* 14* 10*  --   ANIONGAP 15 15 15  17*  --      Hematology Recent Labs  Lab 05/20/23 0332 05/21/23 0445 05/22/23 0501  WBC 15.1* 10.6* 7.2  RBC 3.60* 3.49* 3.38*  HGB 12.3* 11.9* 11.6*  HCT 36.4* 34.9* 34.1*  MCV 101.1* 100.0 100.9*  MCH 34.2* 34.1* 34.3*  MCHC 33.8 34.1 34.0  RDW 16.9* 16.9* 17.0*  PLT 112*  104* 71*    Cardiac EnzymesNo results for input(s): "TROPONINI" in the last 168 hours. No results for input(s): "TROPIPOC" in the last 168 hours.   BNP Recent Labs  Lab 05/19/23 0024  BNP 2,343.1*     DDimer No results for input(s): "DDIMER" in the last 168 hours.   Cardiac Studies   Cardiac Studies & Procedures       ECHOCARDIOGRAM  ECHOCARDIOGRAM COMPLETE 05/22/2023  Narrative ECHOCARDIOGRAM REPORT    Patient Name:   [name] Date of Exam: 05/22/2023 Medical Rec #:  [id number]       Height:       62.0 in Accession #:    [id number]      Weight:       173.9 lb Date of Birth:  04/07/1941      BSA:  1.802 m Patient Age:    82 years        BP:           105/93 mmHg Patient Gender: M               HR:           118 bpm. Exam Location:  Inpatient  Procedure: 2D Echo, Cardiac Doppler, Color Doppler and Intracardiac Opacification Agent  Indications:    Atrial Fibrillation I48.91  History:        Patient has prior history of Echocardiogram examinations, most recent 01/29/2022. CHF, CAD and Previous Myocardial Infarction, ESRD, Arrythmias:Atrial Fibrillation and Tachycardia, Signs/Symptoms:Hypotension and Chest Pain; Risk Factors:Hypertension, Sleep Apnea, Diabetes and Dyslipidemia.  Sonographer:    Lucendia Herrlich RCS Referring Phys: Therisa Doyne  IMPRESSIONS   1. Left ventricular ejection fraction, by estimation, is 25 to 30%. The left ventricle has severely decreased function. The left ventricle demonstrates regional wall motion abnormalities (see scoring diagram/findings for description). The left ventricular internal cavity size was moderately dilated. Left ventricular diastolic function could not be evaluated. There is akinesis of the left ventricular, entire apical segment. There is akinesis of the left ventricular, apical septal wall, anterior wall, inferior wall and inferolateral wall. 2. Right ventricular systolic function is moderately  reduced. The right ventricular size is mildly enlarged. There is moderately elevated pulmonary artery systolic pressure. The estimated right ventricular systolic pressure is 52.7 mmHg. 3. Left atrial size was moderately dilated. 4. Right atrial size was severely dilated. 5. The mitral valve is degenerative. Trivial mitral valve regurgitation. No evidence of mitral stenosis. Moderate mitral annular calcification. 6. Tricuspid valve regurgitation is mild to moderate. 7. The aortic valve is calcified. There is moderate calcification of the aortic valve. There is moderate thickening of the aortic valve. The left coronary cusp is fixed and immobile.. Aortic valve regurgitation is not visualized. No aortic stenosis is present. Aortic valve mean gradient measures 6.0 mmHg. Aortic valve Vmax measures 1.65 m/s. Although mean AVG is low, the DVI is 0.4 and SVI low at 13 indicating at least a mild to moderate degree of low flow low gradient AS. The AVA is underestimated due to small LVOT diameter measurement. 8. The inferior vena cava is dilated in size with <50% respiratory variability, suggesting right atrial pressure of 15 mmHg.  FINDINGS Left Ventricle: Left ventricular ejection fraction, by estimation, is 25 to 30%. The left ventricle has severely decreased function. The left ventricle demonstrates regional wall motion abnormalities. Definity contrast agent was given IV to delineate the left ventricular endocardial borders. The left ventricular internal cavity size was moderately dilated. There is no left ventricular hypertrophy. Left ventricular diastolic function could not be evaluated due to atrial fibrillation. Left ventricular diastolic function could not be evaluated.  Right Ventricle: The right ventricular size is mildly enlarged. No increase in right ventricular wall thickness. Right ventricular systolic function is moderately reduced. There is moderately elevated pulmonary artery systolic pressure.  The tricuspid regurgitant velocity is 3.07 m/s, and with an assumed right atrial pressure of 15 mmHg, the estimated right ventricular systolic pressure is 52.7 mmHg.  Left Atrium: Left atrial size was moderately dilated.  Right Atrium: Right atrial size was severely dilated.  Pericardium: There is no evidence of pericardial effusion.  Mitral Valve: The mitral valve is degenerative in appearance. There is mild thickening of the mitral valve leaflet(s). There is mild calcification of the mitral valve leaflet(s). Moderate mitral annular calcification. Trivial mitral valve regurgitation.  No evidence of mitral valve stenosis.  Tricuspid Valve: The tricuspid valve is normal in structure. Tricuspid valve regurgitation is mild to moderate. No evidence of tricuspid stenosis.  Aortic Valve: The left coronary cusp is fixed and immobile. The aortic valve is calcified. There is moderate calcification of the aortic valve. There is moderate thickening of the aortic valve. Aortic valve regurgitation is not visualized. Mild aortic stenosis is present. Aortic valve mean gradient measures 6.0 mmHg. Aortic valve peak gradient measures 10.9 mmHg. Aortic valve area, by VTI measures 0.92 cm.  Pulmonic Valve: The pulmonic valve was normal in structure. Pulmonic valve regurgitation is trivial. No evidence of pulmonic stenosis.  Aorta: The aortic root is normal in size and structure.  Venous: The inferior vena cava is dilated in size with less than 50% respiratory variability, suggesting right atrial pressure of 15 mmHg.  IAS/Shunts: No atrial level shunt detected by color flow Doppler.   LEFT VENTRICLE PLAX 2D LVIDd:         6.30 cm   Diastology LVIDs:         5.60 cm   LV e' medial:    5.27 cm/s LV PW:         0.95 cm   LV E/e' medial:  17.0 LV IVS:        0.70 cm   LV e' lateral:   6.65 cm/s LVOT diam:     1.70 cm   LV E/e' lateral: 13.5 LV SV:         23 LV SV Index:   13 LVOT Area:     2.27  cm   RIGHT VENTRICLE             IVC RV S prime:     10.30 cm/s  IVC diam: 3.00 cm TAPSE (M-mode): 1.1 cm  LEFT ATRIUM             Index        RIGHT ATRIUM           Index LA diam:        4.55 cm 2.53 cm/m   RA Area:     23.50 cm LA Vol (A2C):   79.1 ml 43.91 ml/m  RA Volume:   76.50 ml  42.46 ml/m LA Vol (A4C):   70.8 ml 39.30 ml/m LA Biplane Vol: 78.5 ml 43.57 ml/m AORTIC VALVE AV Area (Vmax):    1.17 cm AV Area (Vmean):   1.10 cm AV Area (VTI):     0.92 cm AV Vmax:           165.33 cm/s AV Vmean:          111.667 cm/s AV VTI:            0.252 m AV Peak Grad:      10.9 mmHg AV Mean Grad:      6.0 mmHg LVOT Vmax:         85.03 cm/s LVOT Vmean:        54.200 cm/s LVOT VTI:          0.102 m LVOT/AV VTI ratio: 0.40  AORTA Ao Root diam: 3.20 cm Ao Asc diam:  3.65 cm  MITRAL VALVE               TRICUSPID VALVE MV Area (PHT): 5.42 cm    TR Peak grad:   37.7 mmHg MV Decel Time: 140 msec    TR Vmax:        307.00  cm/s MV E velocity: 89.70 cm/s MV A velocity: 33.40 cm/s  SHUNTS MV E/A ratio:  2.69        Systemic VTI:  0.10 m Systemic Diam: 1.70 cm  Armanda Magic MD Electronically signed by Armanda Magic MD Signature Date/Time: 05/22/2023/9:29:30 AM    Final    MONITORS  LONG TERM MONITOR (3-14 DAYS) 04/06/2022  Narrative Mobile cardiac telemetry 13 days 03/16/2022 - 03/30/2022: Dominant rhythm: Sinus. HR 51-113 bpm. Avg HR 75 bpm, in sinus rhythm. >4000 episodes of probable atrial tachycardia, fastest at 200 bpm, avg 128 bpm, longest for 46 secs. 8.6% isolated SVE, 4.0% couplets, 2.0% triplets. 815 episodes of VT, fastest at 226 bpm for 5 beats, longest for 18 beats at 174 bpm. 5.7% isolated VE, <1% couplet/triplets. No atrial fibrillation/atrial flutter/high grade AV block, sinus pause >3sec noted. 0 patient triggered events.            Assessment & Plan   Atrial Fibrillation with Rapid Ventricular Response (Afib, RVR; new onset) - Continue IV  amiodarone. Pt has limited good options for rate control. Will plan for TEE/DCCV Thursday, discussed with multidisciplinary teams and will time it around his Wednesday dialysis. Consent to follow.    Acute on Chronic HF - volume status with HD removal - cannot tolerate GDMT (Hypotension and ESRD) - if rare are difficult to control despite HD Monday and Tuesday, consider TEE/DCCV, see above.   Hypotension - Experienced hypotension, HD RN says run going well with no limitation yet. - midodrine to TID. Received albumin during HD   For questions or updates, please contact [facility] Please consult [email or web address] for contact info under Cardiology/STEMI.

## 2023-05-22 NOTE — Progress Notes (Signed)
[facility] Progress Note   Subjective: Up in chair. Only removed 2.3 liters with HD yesterday. Needs extra treatment today.      Objective Vitals:   05/22/23 0045 05/22/23 0411 05/22/23 0500 05/22/23 0743  BP:  103/77  (!) 105/93  Pulse:  95 (!) 113 (!) 120  Resp:  20 20 19   Temp: 97.9 F (36.6 C) 97.6 F (36.4 C)  98 F (36.7 C)  TempSrc: Oral Oral  Oral  SpO2:  90% 100% 99%  Weight: 77.2 kg 78.9 kg    Height:       Physical Exam General: Chronically ill appearing male in NAD Heart: irreg, irreg No M/R/G. Afib on monitor. HR 100s Lungs: decreased in bases with few bibasilar crackles. No WOB Abdomen: obese, NABS Extremities: 2+ BLE pitting edema Pedal edema present, edema in abdomen Weeping noted RLE.  Dialysis Access: AVG + T/B  Additional Objective Labs: Basic Metabolic Panel: Recent Labs  Lab 05/19/23 0024 05/19/23 0848 05/20/23 0332 05/21/23 0433  NA  --  132* 130* 125*  K  --  5.1 4.6 4.7  CL  --  95* 95* 90*  CO2  --  22 20* 18*  GLUCOSE  --  224* 163* 267*  BUN  --  39* 52* 69*  CREATININE  --  3.25* 4.06* 5.36*  CALCIUM  --  8.5* 8.9 8.7*  PHOS 6.2* 6.5*  --   --    Liver Function Tests: Recent Labs  Lab 05/18/23 1820 05/19/23 0848 05/21/23 0445  AST 57* 51* 24  ALT 93* 78* 61*  ALKPHOS 197* 157* 161*  BILITOT 2.0* 1.9* 1.1  PROT 6.2* 5.4* 5.2*  ALBUMIN 3.0* 2.5* 2.3*   No results for input(s): "LIPASE", "AMYLASE" in the last 168 hours. CBC: Recent Labs  Lab 05/18/23 1820 05/19/23 0848 05/20/23 0332 05/21/23 0445 05/22/23 0501  WBC 10.5 11.4* 15.1* 10.6* 7.2  HGB 13.2 11.8* 12.3* 11.9* 11.6*  HCT 39.7 35.1* 36.4* 34.9* 34.1*  MCV 101.0* 103.5* 101.1* 100.0 100.9*  PLT 123* 121* 112* 104* 71*   Blood Culture    Component Value Date/Time   SDES BLOOD RIGHT HAND 12/18/2022 0716   SPECREQUEST  12/18/2022 0716    BOTTLES DRAWN AEROBIC ONLY Blood Culture results may not be optimal due to an inadequate volume of blood  received in culture bottles   CULT  12/18/2022 0716    NO GROWTH 5 DAYS Performed at [facility], [address] [place], [place] [address]    REPTSTATUS 12/23/2022 FINAL 12/18/2022 0716    Cardiac Enzymes: No results for input(s): "CKTOTAL", "CKMB", "CKMBINDEX", "TROPONINI" in the last 168 hours. CBG: Recent Labs  Lab 05/21/23 2017 05/22/23 0038 05/22/23 0442 05/22/23 0510 05/22/23 0756  GLUCAP 220* 359* 171* 174* 132*   Iron Studies: No results for input(s): "IRON", "TIBC", "TRANSFERRIN", "FERRITIN" in the last 72 hours. @lablastinr3 @ Studies/Results: ECHOCARDIOGRAM COMPLETE  Result Date: 05/22/2023    ECHOCARDIOGRAM REPORT   Patient Name:   Robert Lucas Date of Exam: 05/22/2023 Medical Rec #:  [id number]       Height:       62.0 in Accession #:    [id number]      Weight:       173.9 lb Date of Birth:  [birth year]      BSA:          1.802 m Patient Age:    82 years        BP:  105/93 mmHg Patient Gender: M               HR:           118 bpm. Exam Location:  Inpatient Procedure: 2D Echo, Cardiac Doppler, Color Doppler and Intracardiac            Opacification Agent Indications:    Atrial Fibrillation I48.91  History:        Patient has prior history of Echocardiogram examinations, most                 recent 01/29/2022. CHF, CAD and Previous Myocardial Infarction,                 ESRD, Arrythmias:Atrial Fibrillation and Tachycardia,                 Signs/Symptoms:Hypotension and Chest Pain; Risk                 Factors:Hypertension, Sleep Apnea, Diabetes and Dyslipidemia.  Sonographer:    Lucendia Herrlich RCS Referring Phys: Therisa Doyne IMPRESSIONS  1. Left ventricular ejection fraction, by estimation, is 25 to 30%. The left ventricle has severely decreased function. The left ventricle demonstrates regional wall motion abnormalities (see scoring diagram/findings for description). The left ventricular internal cavity size was moderately dilated. Left ventricular  diastolic function could not be evaluated. There is akinesis of the left ventricular, entire apical segment. There is akinesis of the left ventricular, apical septal wall, anterior  wall, inferior wall and inferolateral wall.  2. Right ventricular systolic function is moderately reduced. The right ventricular size is mildly enlarged. There is moderately elevated pulmonary artery systolic pressure. The estimated right ventricular systolic pressure is 52.7 mmHg.  3. Left atrial size was moderately dilated.  4. Right atrial size was severely dilated.  5. The mitral valve is degenerative. Trivial mitral valve regurgitation. No evidence of mitral stenosis. Moderate mitral annular calcification.  6. Tricuspid valve regurgitation is mild to moderate.  7. The aortic valve is calcified. There is moderate calcification of the aortic valve. There is moderate thickening of the aortic valve. The left coronary cusp is fixed and immobile..     Aortic valve regurgitation is not visualized. No aortic stenosis is present. Aortic valve mean gradient measures 6.0 mmHg. Aortic valve Vmax measures 1.65 m/s. Although mean AVG is low, the DVI is 0.4 and SVI low at 13 indicating at least a mild to moderate degree of low flow low gradient AS. The AVA is underestimated due to small LVOT diameter measurement.  8. The inferior vena cava is dilated in size with <50% respiratory variability, suggesting right atrial pressure of 15 mmHg. FINDINGS  Left Ventricle: Left ventricular ejection fraction, by estimation, is 25 to 30%. The left ventricle has severely decreased function. The left ventricle demonstrates regional wall motion abnormalities. Definity contrast agent was given IV to delineate the left ventricular endocardial borders. The left ventricular internal cavity size was moderately dilated. There is no left ventricular hypertrophy. Left ventricular diastolic function could not be evaluated due to atrial fibrillation. Left ventricular  diastolic function could not be evaluated. Right Ventricle: The right ventricular size is mildly enlarged. No increase in right ventricular wall thickness. Right ventricular systolic function is moderately reduced. There is moderately elevated pulmonary artery systolic pressure. The tricuspid regurgitant velocity is 3.07 m/s, and with an assumed right atrial pressure of 15 mmHg, the estimated right ventricular systolic pressure is 52.7 mmHg. Left Atrium: Left atrial size was moderately dilated.  Right Atrium: Right atrial size was severely dilated. Pericardium: There is no evidence of pericardial effusion. Mitral Valve: The mitral valve is degenerative in appearance. There is mild thickening of the mitral valve leaflet(s). There is mild calcification of the mitral valve leaflet(s). Moderate mitral annular calcification. Trivial mitral valve regurgitation. No evidence of mitral valve stenosis. Tricuspid Valve: The tricuspid valve is normal in structure. Tricuspid valve regurgitation is mild to moderate. No evidence of tricuspid stenosis. Aortic Valve: The left coronary cusp is fixed and immobile. The aortic valve is calcified. There is moderate calcification of the aortic valve. There is moderate thickening of the aortic valve. Aortic valve regurgitation is not visualized. Mild aortic stenosis is present. Aortic valve mean gradient measures 6.0 mmHg. Aortic valve peak gradient measures 10.9 mmHg. Aortic valve area, by VTI measures 0.92 cm. Pulmonic Valve: The pulmonic valve was normal in structure. Pulmonic valve regurgitation is trivial. No evidence of pulmonic stenosis. Aorta: The aortic root is normal in size and structure. Venous: The inferior vena cava is dilated in size with less than 50% respiratory variability, suggesting right atrial pressure of 15 mmHg. IAS/Shunts: No atrial level shunt detected by color flow Doppler.  LEFT VENTRICLE PLAX 2D LVIDd:         6.30 cm   Diastology LVIDs:         5.60 cm   LV e'  medial:    5.27 cm/s LV PW:         0.95 cm   LV E/e' medial:  17.0 LV IVS:        0.70 cm   LV e' lateral:   6.65 cm/s LVOT diam:     1.70 cm   LV E/e' lateral: 13.5 LV SV:         23 LV SV Index:   13 LVOT Area:     2.27 cm  RIGHT VENTRICLE             IVC RV S prime:     10.30 cm/s  IVC diam: 3.00 cm TAPSE (M-mode): 1.1 cm LEFT ATRIUM             Index        RIGHT ATRIUM           Index LA diam:        4.55 cm 2.53 cm/m   RA Area:     23.50 cm LA Vol (A2C):   79.1 ml 43.91 ml/m  RA Volume:   76.50 ml  42.46 ml/m LA Vol (A4C):   70.8 ml 39.30 ml/m LA Biplane Vol: 78.5 ml 43.57 ml/m  AORTIC VALVE AV Area (Vmax):    1.17 cm AV Area (Vmean):   1.10 cm AV Area (VTI):     0.92 cm AV Vmax:           165.33 cm/s AV Vmean:          111.667 cm/s AV VTI:            0.252 m AV Peak Grad:      10.9 mmHg AV Mean Grad:      6.0 mmHg LVOT Vmax:         85.03 cm/s LVOT Vmean:        54.200 cm/s LVOT VTI:          0.102 m LVOT/AV VTI ratio: 0.40  AORTA Ao Root diam: 3.20 cm Ao Asc diam:  3.65 cm MITRAL VALVE  TRICUSPID VALVE MV Area (PHT): 5.42 cm    TR Peak grad:   37.7 mmHg MV Decel Time: 140 msec    TR Vmax:        307.00 cm/s MV E velocity: 89.70 cm/s MV A velocity: 33.40 cm/s  SHUNTS MV E/A ratio:  2.69        Systemic VTI:  0.10 m                            Systemic Diam: 1.70 cm Armanda Magic MD Electronically signed by Armanda Magic MD Signature Date/Time: 05/22/2023/9:29:30 AM    Final    Medications:  amiodarone 30 mg/hr (05/22/23 0726)    (feeding supplement) PROSource Plus  30 mL Oral BID BM   allopurinol  100 mg Oral q AM   apixaban  2.5 mg Oral BID   atorvastatin  40 mg Oral QHS   Chlorhexidine Gluconate Cloth  6 each Topical Q0600   gabapentin  300 mg Oral QHS   insulin aspart  0-6 Units Subcutaneous Q4H   insulin NPH Human  10 Units Subcutaneous QAC breakfast   midodrine  10 mg Oral TID WC   sodium chloride flush  3 mL Intravenous Q12H   tamsulosin  0.4 mg Oral QHS     HD  orders:  NW MWF 400/auto 1.5 3K/2.5Ca EDW 68.1 4h -Heparin 2500 units IV three times per week  - Calcitriol 1.25 mcg PO  three times per week No ESA   Assessment/Plan: Robert Lucas is an 82 y.o. male with ESRD on HD MWF, HTN, DM, CAD, combined CHF (2023 grade 1 DD and EF 45%) currently hospitalized with new A fib complicated by hypotension and nephrology is consulted for evaluation and management of ESRD.    A fib with RVR complicated by hypotension:  cardiology consulting - on amio now. TTE pending-will probably be done tomorrow.  CTA neg PE.     ESRD on HD MWF: MWF. Next HD 05/23/2023. Extra treatment today.    HFrEF with evidence of volume overload. EF 30-35% Cards following.    HTN: dx HTN at baseline, now with hypotension in setting of a fib, hold meds. On midodrine 10 mg PO TID, Needs volume removed but hypotension/uncontrolled AFIB impeding progress. Order extra treatment 3 hours sequential for volume removal only. UF as tolerated. Continue to attempt to optimize volume with HD.    Anemia of CKD:  Hb in 11s, no ESA needed outpt   BMM: cont outpt calcitriol and binder doses. Renal diet.    Nutrition: Very low albumin. Add protein supplements  Marielis Samara H. Krishiv Sandler NP-C 05/22/2023, 10:30 AM  [employer] [phone]

## 2023-05-22 NOTE — Progress Notes (Signed)
Echocardiogram 2D Echocardiogram has been performed.  Lucendia Herrlich 05/22/2023, 9:13 AM

## 2023-05-22 NOTE — Progress Notes (Signed)
   05/22/23 1630  Vitals  Pulse Rate 63  Resp 19  BP 120/63  SpO2 100 %  O2 Device Nasal Cannula  During Treatment Monitoring  Blood Flow Rate (mL/min) 349 mL/min  Arterial Pressure (mmHg) -160.39 mmHg  Venous Pressure (mmHg) 304.83 mmHg  TMP (mmHg) -6.87 mmHg  Ultrafiltration Rate (mL/min) 892 mL/min  Dialysate Flow Rate (mL/min) 49 ml/min  Dialysate Potassium Concentration 3  Dialysate Calcium Concentration 2.5  Duration of HD Treatment -hour(s) 2.71 hour(s)  Cumulative Fluid Removed (mL) per Treatment  2587.36  HD Safety Checks Performed Yes  Intra-Hemodialysis Comments Progressing as prescribed;Tolerated well;Tx completed   Received patient in bed to unit.  Alert and oriented.  Informed consent signed and in chart.   TX duration:3  Patient tolerated well.  Transported back to the room  Alert, without acute distress.  Hand-off given to patient's nurse.   Access used: Yes Access issues: No  Total UF removed: 2900 Medication(s) given: See MAR Post HD VS: See Above Grid Post HD weight: 75.9 kg   [facility]

## 2023-05-22 NOTE — Progress Notes (Signed)
   [name] has been requested to perform a transesophageal echocardiogram on Robert Lucas for atrial fibrillation.  After careful review of history and examination, the risks and benefits of transesophageal echocardiogram have been explained including risks of esophageal damage, perforation (1:10,000 risk), bleeding, pharyngeal hematoma as well as other potential complications associated with conscious sedation including aspiration, arrhythmia, respiratory failure and death. Alternatives to treatment were discussed, questions were answered. Patient is willing to proceed.   Pt scheduled Thursday 05/24/23. NPO at [facility] Wed night.   [name] [name], [place]  05/22/2023 10:44 AM

## 2023-05-22 NOTE — Progress Notes (Addendum)
Mobility Specialist Progress Note:    05/22/23 1132  Mobility  Activity Ambulated with assistance in hallway  Level of Assistance Moderate assist, patient does 50-74%  Assistive Device Front wheel walker  Distance Ambulated (ft) 200 ft  Activity Response Tolerated well  Mobility Referral Yes  $Mobility charge 1 Mobility  Mobility Specialist Start Time (ACUTE ONLY) [id number]  Mobility Specialist Stop Time (ACUTE ONLY) 0950  Mobility Specialist Time Calculation (min) (ACUTE ONLY) 11 min   Pt received at end of OT session agreeable to ambulate further. Pt needed ModA to stand but was a contact guard to ambulate. Needed x2 seated rest break d/t fatigue, otherwise no c/o. Returned to room w/o fault. Left in chair w/ call bell and personal belongings in reach. All needs met.  During session: HR 115-127 Post session: HR 111  Thompson Grayer Mobility Specialist  Please contact vis Secure Chat or  Rehab Office [phone]

## 2023-05-22 NOTE — Plan of Care (Signed)
  Problem: Health Behavior/Discharge Planning: Goal: Ability to manage health-related needs will improve Outcome: Progressing   Problem: Education: Goal: Knowledge of disease or condition will improve Outcome: Progressing   

## 2023-05-22 NOTE — Progress Notes (Signed)
[facility]                                                                               [name], is a 82 y.o. male, DOB - 11/28/1940, ZOX:[id number] Admit date - 05/18/2023    Outpatient Primary MD for the patient is Joycelyn Rua, MD  LOS - 3  days    Brief summary    Robert Lucas is a 82 y.o. male with medical history significant of ESRD on HD MWF ,  DM2, systolic/diastolic heart failure ef 30-35%, HLD, anemia, CAD, HTN, hx of MI, gout, Presented with hypotension, atrial fibrillation with RVR. He was started on IV amiodarone and  IV heparin transitioned to eliqus.  He is scheduled for DCCV on Thursday.   Assessment & Plan    Assessment and Plan:  Atrial fibrillation with RVR: Rate not well controlled.  Initially started on IV Cardizem transitioned to  amiodarone gtt.  He remains in afib  with rates between 110 to 120/min.  Cardiology on board, plan for DCCV on Thursday.  Tsh is wnl.  Echocardiogram reviewed.   Acute on chronic combined CHF: - Fluid management with HD. Nephrology on board.  - on midodrine 5 mg TID.   Hypotension.   BP parameters borderline low.  On Midodrine.   Diabetes Mellitus type 2: CBG (last 3)  Recent Labs    05/22/23 0756 05/22/23 1121 05/22/23 1724  GLUCAP 132* 216* 220*   Uncontrolled with hyperglycemia  Continue with Novolin N 10 units daily, SSI and 2 units of Novolog added TIDAC.  A1c is 8.8   ESRD on HD Nephrology on board and appreciate recommendations.    Hyperlipidemia Resume statin.    BPH Continue with flomax.     Hypervolemic Hyponatremia From ESRD and fluid overload.   Elevated liver enzymes ? CHF Increased liver echotexture consistent with hepatic steatosis.  Improved. Bilirubin wnl.  Alk phos is 161,  Monitor.    Mild leukocytosis   Elevated lactic acid  - possibly from CHF. Repeat in am.    Estimated body mass index is 30.6 kg/m as calculated from the following:    Height as of this encounter: 5\' 2"  (1.575 m).   Weight as of this encounter: 75.9 kg.  Code Status: full code.  DVT Prophylaxis:  apixaban (ELIQUIS) tablet 2.5 mg Start: 05/20/23 1245 SCDs Start: 05/19/23 0549 apixaban (ELIQUIS) tablet 2.5 mg   Level of Care: Level of care: Progressive Family Communication: none at bedside.   Disposition Plan:     Remains inpatient appropriate:  pending   Procedures:  Echocardiogram.   Consultants:   Cardiology.  Nephrology.  Antimicrobials:   Anti-infectives (From admission, onward)    None        Medications  Scheduled Meds:  (feeding supplement) PROSource Plus  30 mL Oral BID BM   allopurinol  100 mg Oral q AM   apixaban  2.5 mg Oral BID   atorvastatin  40 mg Oral QHS   Chlorhexidine Gluconate Cloth  6 each Topical Q0600   gabapentin  300 mg Oral QHS   insulin aspart  0-6 Units Subcutaneous Q4H   insulin NPH  Human  10 Units Subcutaneous QAC breakfast   midodrine  10 mg Oral TID WC   sodium chloride flush  3 mL Intravenous Q12H   tamsulosin  0.4 mg Oral QHS   Continuous Infusions:  amiodarone 30 mg/hr (05/22/23 0726)   PRN Meds:.acetaminophen **OR** acetaminophen, ondansetron **OR** ondansetron (ZOFRAN) IV, sodium chloride flush    Subjective:   [name] was seen and examined today.  Off oxygen , in good spirits.   Objective:   Vitals:   05/22/23 1600 05/22/23 1630 05/22/23 1649 05/22/23 1726  BP: 102/68 120/63 100/65 98/69  Pulse: (!) 108 63 (!) 102 (!) 110  Resp: 19 19 17 20   Temp:   97.8 F (36.6 C) 97.9 F (36.6 C)  TempSrc:    Oral  SpO2:  100% 100% 100%  Weight:   75.9 kg   Height:        Intake/Output Summary (Last 24 hours) at 05/22/2023 1826 Last data filed at 05/22/2023 1649 Gross per 24 hour  Intake 910.75 ml  Output 2900 ml  Net -1989.25 ml   Filed Weights   05/22/23 0411 05/22/23 1319 05/22/23 1649  Weight: 78.9 kg 76.5 kg 75.9 kg     Exam General exam: Appears calm and  comfortable  Respiratory system: Clear to auscultation. Respiratory effort normal. Cardiovascular system: S1 & S2 heard, irregularly irregular,  Gastrointestinal system: Abdomen is nondistended, soft and nontender.  Central nervous system: Alert and oriented. No focal neurological deficits. Extremities: Symmetric 5 x 5 power. Skin: No rashes,  Psychiatry:  Mood & affect appropriate.       Data Reviewed:  I have personally reviewed following labs and imaging studies   CBC Lab Results  Component Value Date   WBC 7.2 05/22/2023   RBC 3.38 (L) 05/22/2023   HGB 11.6 (L) 05/22/2023   HCT 34.1 (L) 05/22/2023   MCV 100.9 (H) 05/22/2023   MCH 34.3 (H) 05/22/2023   PLT 71 (L) 05/22/2023   MCHC 34.0 05/22/2023   RDW 17.0 (H) 05/22/2023   LYMPHSABS 1.3 12/18/2022   MONOABS 0.9 12/18/2022   EOSABS 0.0 12/18/2022   BASOSABS 0.0 12/18/2022     Last metabolic panel Lab Results  Component Value Date   NA 123 (L) 05/22/2023   K 3.6 05/22/2023   CL 90 (L) 05/22/2023   CO2 19 (L) 05/22/2023   BUN 53 (H) 05/22/2023   CREATININE 3.69 (H) 05/22/2023   GLUCOSE 335 (H) 05/22/2023   GFRNONAA 15 (L) 05/22/2023   GFRAA 26 (L) 05/07/2019   CALCIUM 7.5 (L) 05/22/2023   PHOS 5.3 (H) 05/22/2023   PROT 5.2 (L) 05/21/2023   ALBUMIN 2.1 (L) 05/22/2023   BILITOT 1.1 05/21/2023   ALKPHOS 161 (H) 05/21/2023   AST 24 05/21/2023   ALT 61 (H) 05/21/2023   ANIONGAP 14 05/22/2023    CBG (last 3)  Recent Labs    05/22/23 0756 05/22/23 1121 05/22/23 1724  GLUCAP 132* 216* 220*      Coagulation Profile: Recent Labs  Lab 05/19/23 0024  INR 1.2     Radiology Studies: ECHOCARDIOGRAM COMPLETE  Result Date: 05/22/2023    ECHOCARDIOGRAM REPORT   Patient Name:   Robert Lucas Date of Exam: 05/22/2023 Medical Rec #:  [id number]       Height:       62.0 in Accession #:    [id number]      Weight:       173.9 lb Date of Birth:  11-Oct-1940  BSA:          1.802 m Patient Age:    82 years         BP:           105/93 mmHg Patient Gender: M               HR:           118 bpm. Exam Location:  Inpatient Procedure: 2D Echo, Cardiac Doppler, Color Doppler and Intracardiac            Opacification Agent Indications:    Atrial Fibrillation I48.91  History:        Patient has prior history of Echocardiogram examinations, most                 recent 01/29/2022. CHF, CAD and Previous Myocardial Infarction,                 ESRD, Arrythmias:Atrial Fibrillation and Tachycardia,                 Signs/Symptoms:Hypotension and Chest Pain; Risk                 Factors:Hypertension, Sleep Apnea, Diabetes and Dyslipidemia.  Sonographer:    Lucendia Herrlich RCS Referring Phys: Therisa Doyne IMPRESSIONS  1. Left ventricular ejection fraction, by estimation, is 25 to 30%. The left ventricle has severely decreased function. The left ventricle demonstrates regional wall motion abnormalities (see scoring diagram/findings for description). The left ventricular internal cavity size was moderately dilated. Left ventricular diastolic function could not be evaluated. There is akinesis of the left ventricular, entire apical segment. There is akinesis of the left ventricular, apical septal wall, anterior  wall, inferior wall and inferolateral wall.  2. Right ventricular systolic function is moderately reduced. The right ventricular size is mildly enlarged. There is moderately elevated pulmonary artery systolic pressure. The estimated right ventricular systolic pressure is 52.7 mmHg.  3. Left atrial size was moderately dilated.  4. Right atrial size was severely dilated.  5. The mitral valve is degenerative. Trivial mitral valve regurgitation. No evidence of mitral stenosis. Moderate mitral annular calcification.  6. Tricuspid valve regurgitation is mild to moderate.  7. The aortic valve is calcified. There is moderate calcification of the aortic valve. There is moderate thickening of the aortic valve. The left coronary cusp is  fixed and immobile..     Aortic valve regurgitation is not visualized. No aortic stenosis is present. Aortic valve mean gradient measures 6.0 mmHg. Aortic valve Vmax measures 1.65 m/s. Although mean AVG is low, the DVI is 0.4 and SVI low at 13 indicating at least a mild to moderate degree of low flow low gradient AS. The AVA is underestimated due to small LVOT diameter measurement.  8. The inferior vena cava is dilated in size with <50% respiratory variability, suggesting right atrial pressure of 15 mmHg. FINDINGS  Left Ventricle: Left ventricular ejection fraction, by estimation, is 25 to 30%. The left ventricle has severely decreased function. The left ventricle demonstrates regional wall motion abnormalities. Definity contrast agent was given IV to delineate the left ventricular endocardial borders. The left ventricular internal cavity size was moderately dilated. There is no left ventricular hypertrophy. Left ventricular diastolic function could not be evaluated due to atrial fibrillation. Left ventricular diastolic function could not be evaluated. Right Ventricle: The right ventricular size is mildly enlarged. No increase in right ventricular wall thickness. Right ventricular systolic function is moderately reduced. There is moderately elevated pulmonary  artery systolic pressure. The tricuspid regurgitant velocity is 3.07 m/s, and with an assumed right atrial pressure of 15 mmHg, the estimated right ventricular systolic pressure is 52.7 mmHg. Left Atrium: Left atrial size was moderately dilated. Right Atrium: Right atrial size was severely dilated. Pericardium: There is no evidence of pericardial effusion. Mitral Valve: The mitral valve is degenerative in appearance. There is mild thickening of the mitral valve leaflet(s). There is mild calcification of the mitral valve leaflet(s). Moderate mitral annular calcification. Trivial mitral valve regurgitation. No evidence of mitral valve stenosis. Tricuspid Valve:  The tricuspid valve is normal in structure. Tricuspid valve regurgitation is mild to moderate. No evidence of tricuspid stenosis. Aortic Valve: The left coronary cusp is fixed and immobile. The aortic valve is calcified. There is moderate calcification of the aortic valve. There is moderate thickening of the aortic valve. Aortic valve regurgitation is not visualized. Mild aortic stenosis is present. Aortic valve mean gradient measures 6.0 mmHg. Aortic valve peak gradient measures 10.9 mmHg. Aortic valve area, by VTI measures 0.92 cm. Pulmonic Valve: The pulmonic valve was normal in structure. Pulmonic valve regurgitation is trivial. No evidence of pulmonic stenosis. Aorta: The aortic root is normal in size and structure. Venous: The inferior vena cava is dilated in size with less than 50% respiratory variability, suggesting right atrial pressure of 15 mmHg. IAS/Shunts: No atrial level shunt detected by color flow Doppler.  LEFT VENTRICLE PLAX 2D LVIDd:         6.30 cm   Diastology LVIDs:         5.60 cm   LV e' medial:    5.27 cm/s LV PW:         0.95 cm   LV E/e' medial:  17.0 LV IVS:        0.70 cm   LV e' lateral:   6.65 cm/s LVOT diam:     1.70 cm   LV E/e' lateral: 13.5 LV SV:         23 LV SV Index:   13 LVOT Area:     2.27 cm  RIGHT VENTRICLE             IVC RV S prime:     10.30 cm/s  IVC diam: 3.00 cm TAPSE (M-mode): 1.1 cm LEFT ATRIUM             Index        RIGHT ATRIUM           Index LA diam:        4.55 cm 2.53 cm/m   RA Area:     23.50 cm LA Vol (A2C):   79.1 ml 43.91 ml/m  RA Volume:   76.50 ml  42.46 ml/m LA Vol (A4C):   70.8 ml 39.30 ml/m LA Biplane Vol: 78.5 ml 43.57 ml/m  AORTIC VALVE AV Area (Vmax):    1.17 cm AV Area (Vmean):   1.10 cm AV Area (VTI):     0.92 cm AV Vmax:           165.33 cm/s AV Vmean:          111.667 cm/s AV VTI:            0.252 m AV Peak Grad:      10.9 mmHg AV Mean Grad:      6.0 mmHg LVOT Vmax:         85.03 cm/s LVOT Vmean:        54.200 cm/s LVOT VTI:  0.102 m LVOT/AV VTI ratio: 0.40  AORTA Ao Root diam: 3.20 cm Ao Asc diam:  3.65 cm MITRAL VALVE               TRICUSPID VALVE MV Area (PHT): 5.42 cm    TR Peak grad:   37.7 mmHg MV Decel Time: 140 msec    TR Vmax:        307.00 cm/s MV E velocity: 89.70 cm/s MV A velocity: 33.40 cm/s  SHUNTS MV E/A ratio:  2.69        Systemic VTI:  0.10 m                            Systemic Diam: 1.70 cm Armanda Magic MD Electronically signed by Armanda Magic MD Signature Date/Time: 05/22/2023/9:29:30 AM    Final        Kathlen Mody M.D. [facility] 05/22/2023, 6:26 PM  Available via Epic secure chat 7am-7pm After 7 pm, please refer to night coverage provider listed on amion.

## 2023-05-22 NOTE — Care Management Important Message (Signed)
Important Message  Patient Details  Name: Robert Lucas MRN: [id number] Date of Birth: [birth year]   Important Message Given:  Yes - Medicare IM     Dorena Bodo 05/22/2023, 1:31 PM

## 2023-05-23 DIAGNOSIS — I4891 Unspecified atrial fibrillation: Secondary | ICD-10-CM | POA: Diagnosis not present

## 2023-05-23 LAB — BASIC METABOLIC PANEL
Anion gap: 15 (ref 5–15)
BUN: 55 mg/dL — ABNORMAL HIGH (ref 8–23)
CO2: 21 mmol/L — ABNORMAL LOW (ref 22–32)
Calcium: 8.1 mg/dL — ABNORMAL LOW (ref 8.9–10.3)
Chloride: 89 mmol/L — ABNORMAL LOW (ref 98–111)
Creatinine, Ser: 4.1 mg/dL — ABNORMAL HIGH (ref 0.61–1.24)
GFR, Estimated: 14 mL/min — ABNORMAL LOW (ref >60)
Glucose, Bld: 277 mg/dL — ABNORMAL HIGH (ref 70–99)
Potassium: 4.5 mmol/L (ref 3.5–5.1)
Sodium: 125 mmol/L — ABNORMAL LOW (ref 135–145)

## 2023-05-23 LAB — GLUCOSE, CAPILLARY
Glucose-Capillary: 106 mg/dL — ABNORMAL HIGH (ref 70–99)
Glucose-Capillary: 144 mg/dL — ABNORMAL HIGH (ref 70–99)
Glucose-Capillary: 178 mg/dL — ABNORMAL HIGH (ref 70–99)
Glucose-Capillary: 209 mg/dL — ABNORMAL HIGH (ref 70–99)
Glucose-Capillary: 240 mg/dL — ABNORMAL HIGH (ref 70–99)
Glucose-Capillary: 93 mg/dL (ref 70–99)

## 2023-05-23 LAB — CBC
HCT: 33.8 % — ABNORMAL LOW (ref 39.0–52.0)
Hemoglobin: 11.5 g/dL — ABNORMAL LOW (ref 13.0–17.0)
MCH: 33.6 pg (ref 26.0–34.0)
MCHC: 34 g/dL (ref 30.0–36.0)
MCV: 98.8 fL (ref 80.0–100.0)
Platelets: 61 10*3/uL — ABNORMAL LOW (ref 150–400)
RBC: 3.42 MIL/uL — ABNORMAL LOW (ref 4.22–5.81)
RDW: 17.2 % — ABNORMAL HIGH (ref 11.5–15.5)
WBC: 6.8 10*3/uL (ref 4.0–10.5)
nRBC: 1 % — ABNORMAL HIGH (ref 0.0–0.2)

## 2023-05-23 MED ORDER — MIDODRINE HCL 5 MG PO TABS
10.0000 mg | ORAL_TABLET | ORAL | Status: AC
  Administered 2023-05-23: 10 mg via ORAL
  Filled 2023-05-23: qty 2

## 2023-05-23 NOTE — Plan of Care (Signed)
  Problem: Elimination: Goal: Will not experience complications related to bowel motility Outcome: Completed/Met   Problem: Pain Management: Goal: General experience of comfort will improve Outcome: Completed/Met

## 2023-05-23 NOTE — Progress Notes (Addendum)
PROGRESS NOTE    Robert Lucas  EXB:[id number] DOB: 1941-02-11 DOA: 05/18/2023 PCP: Joycelyn Rua, MD   Brief Narrative:  This 82 yrs old male with PMH significant for ESRD on HD MWF ,  DM2, Combined systolic/diastolic heart failure with LVEF 30-35%, HLD, anemia, CAD, HTN, hx. of MI, gout, Presented with hypotension, atrial fibrillation with RVR. He was started on IV amiodarone and  IV heparin,subsequently transitioned to eliqus.  He is scheduled for DCCV on Thursday.   Assessment & Plan:   Principal Problem:   Atrial fibrillation with RVR (HCC) Active Problems:   DM2 (diabetes mellitus, type 2) (HCC)   Essential hypertension   Hyperlipidemia   Coronary artery disease involving native coronary artery of native heart without angina pectoris   Chronic combined systolic and diastolic CHF (congestive heart failure) (HCC)   ESRD (end stage renal disease) (HCC)   Acute respiratory failure with hypoxia (HCC)   Hypotension  Atrial fibrillation with RVR: Heart rate not well-controlled.   Initially started on IV Cardizem then transitioned to amiodarone gtt.due to hypotension. He remains in afib with rates between 110 to 120/min.  Cardiology on board, plan for DCCV on Thursday.  TSH > WNL. Echocardiogram reviewed.  LVEF 25 to 30%, regional wall motion abnormalities.   Acute on chronic combined CHF: Continue Fluid management with HD. Nephrology on board.  Continue midodrine10 mg 3 times daily   Hypotension:  BP parameters are borderline low.  Continue Midodrine.    Diabetes Mellitus type 2: Uncontrolled with hyperglycemia  Continue with Novolin N 10 units daily, SSI and 2 units of Novolog added TIDAC.  HbA1c is 8.8. Carb modified diet.   ESRD on HD: Nephrology on board and appreciate recommendations.  Continue hemodialysis as per schedule.   Hyperlipidemia: Continue statin.    BPH: Continue with flomax.     Hypervolemic Hyponatremia: Likely from ESRD and fluid  overload.  Continue hemodialysis as per schedule.   Elevated liver enzymes Increased liver echotexture consistent with hepatic steatosis.  Improved. Bilirubin wnl.  Could be due to CHF. Continue to monitor liver enzymes   Mild leukocytosis: Resolved.   Elevated lactic acid  possibly from CHF. Repeat in am.    Obesity: Estimated body mass index is 30.6 kg/m as calculated from the following:   Height as of this encounter: 5\' 2"  (1.575 m).   Weight as of this encounter: 75.9 kg.  Thrombocytopenia: Monitor platelet count.  No signs of active bleeding.  DVT prophylaxis: Eliquis Code Status:Full code Family Communication: No family at bed side. Disposition Plan:    Status is: Inpatient Remains inpatient appropriate because: Admitted for A-fib with RVR and hypotension.  Cardiology is consulted,  started on amiodarone, Patient remains on Eliquis.  plan is for DCCV on Thursday.  He remains on hemodialysis as per schedule.   Consultants:  Cardiology Nephrology  Procedures:  scheduled DCCV 11/21 Antimicrobials:  Anti-infectives (From admission, onward)    None       Subjective: Patient was seen and examined at bedside.  Overnight events noted.   Patient still remains in A-fib.  Heart rate is not controlled.   Denies chest pain. Patient remains on midodrine and amiodarone gtt.   He was scheduled for DCCV tomorrow.  Objective: Vitals:   05/23/23 0411 05/23/23 0735 05/23/23 0952 05/23/23 1000  BP:  (!) 80/63 95/64 (!) 94/46  Pulse:  (!) 121 (!) 116 (!) 119  Resp:  (!) 24 (!) 26 (!) 23  Temp: 97.9 F (  36.6 C) 97.7 F (36.5 C) 98.3 F (36.8 C)   TempSrc: Oral Oral    SpO2:  98% 97% 96%  Weight: 74 kg  76.3 kg   Height:        Intake/Output Summary (Last 24 hours) at 05/23/2023 1028 Last data filed at 05/23/2023 0901 Gross per 24 hour  Intake 781.82 ml  Output 2900 ml  Net -2118.18 ml   Filed Weights   05/23/23 0253 05/23/23 0411 05/23/23 0952  Weight: 78.4  kg 74 kg 76.3 kg    Examination: General exam: Appears calm and comfortable , deconditioned, not in any distress Respiratory system: Clear to auscultation. Respiratory effort normal.  RR 15 Cardiovascular system: S1 & S2 heard, RRR. No JVD, murmurs, rubs, gallops or clicks. No pedal edema. Gastrointestinal system: Abdomen is non distended, soft and non tender. Normal bowel sounds heard. Central nervous system: Alert and oriented x 3. No focal neurological deficits. Extremities: No edema, No cyanosis, No clubbing Skin: No rashes, lesions or ulcers Psychiatry: Judgement and insight appear normal. Mood & affect appropriate.     Data Reviewed: I have personally reviewed following labs and imaging studies  CBC: Recent Labs  Lab 05/19/23 0848 05/20/23 0332 05/21/23 0445 05/22/23 0501 05/23/23 0351  WBC 11.4* 15.1* 10.6* 7.2 6.8  HGB 11.8* 12.3* 11.9* 11.6* 11.5*  HCT 35.1* 36.4* 34.9* 34.1* 33.8*  MCV 103.5* 101.1* 100.0 100.9* 98.8  PLT 121* 112* 104* 71* 61*   Basic Metabolic Panel: Recent Labs  Lab 05/18/23 1820 05/19/23 0024 05/19/23 0848 05/20/23 0332 05/21/23 0433 05/22/23 1343  NA 134*  --  132* 130* 125* 123*  K 4.5  --  5.1 4.6 4.7 3.6  CL 93*  --  95* 95* 90* 90*  CO2 26  --  22 20* 18* 19*  GLUCOSE 198*  --  224* 163* 267* 335*  BUN 27*  --  39* 52* 69* 53*  CREATININE 2.38*  --  3.25* 4.06* 5.36* 3.69*  CALCIUM 9.0  --  8.5* 8.9 8.7* 7.5*  MG  --  2.0 2.1  --   --   --   PHOS  --  6.2* 6.5*  --   --  5.3*   GFR: Estimated Creatinine Clearance: 13.3 mL/min (A) (by C-G formula based on SCr of 3.69 mg/dL (H)). Liver Function Tests: Recent Labs  Lab 05/18/23 1820 05/19/23 0848 05/21/23 0445 05/22/23 1343  AST 57* 51* 24  --   ALT 93* 78* 61*  --   ALKPHOS 197* 157* 161*  --   BILITOT 2.0* 1.9* 1.1  --   PROT 6.2* 5.4* 5.2*  --   ALBUMIN 3.0* 2.5* 2.3* 2.1*   No results for input(s): "LIPASE", "AMYLASE" in the last 168 hours. No results for  input(s): "AMMONIA" in the last 168 hours. Coagulation Profile: Recent Labs  Lab 05/19/23 0024  INR 1.2   Cardiac Enzymes: No results for input(s): "CKTOTAL", "CKMB", "CKMBINDEX", "TROPONINI" in the last 168 hours. BNP (last 3 results) No results for input(s): "PROBNP" in the last 8760 hours. HbA1C: No results for input(s): "HGBA1C" in the last 72 hours. CBG: Recent Labs  Lab 05/22/23 1724 05/22/23 2021 05/23/23 0025 05/23/23 0339 05/23/23 0809  GLUCAP 220* 305* 106* 144* 240*   Lipid Profile: No results for input(s): "CHOL", "HDL", "LDLCALC", "TRIG", "CHOLHDL", "LDLDIRECT" in the last 72 hours. Thyroid Function Tests: No results for input(s): "TSH", "T4TOTAL", "FREET4", "T3FREE", "THYROIDAB" in the last 72 hours. Anemia Panel: No results  for input(s): "VITAMINB12", "FOLATE", "FERRITIN", "TIBC", "IRON", "RETICCTPCT" in the last 72 hours. Sepsis Labs: Recent Labs  Lab 05/19/23 0024 05/19/23 0421 05/19/23 1417 05/21/23 0445  LATICACIDVEN 2.8* 2.8* 4.0* 2.3*    Recent Results (from the past 240 hour(s))  MRSA Next Gen by PCR, Nasal     Status: None   Collection Time: 05/22/23  4:40 AM   Specimen: Nasal Mucosa; Nasal Swab  Result Value Ref Range Status   MRSA by PCR Next Gen NOT DETECTED NOT DETECTED Final    Comment: (NOTE) The GeneXpert MRSA Assay (FDA approved for NASAL specimens only), is one component of a comprehensive MRSA colonization surveillance program. It is not intended to diagnose MRSA infection nor to guide or monitor treatment for MRSA infections. Test performance is not FDA approved in patients less than 34 years old. Performed at [facility], [address] [place], [place] [address]     Radiology Studies: ECHOCARDIOGRAM COMPLETE  Result Date: 05/22/2023    ECHOCARDIOGRAM REPORT   Patient Name:   Robert Lucas Date of Exam: 05/22/2023 Medical Rec #:  [id number]       Height:       62.0 in Accession #:    [id number]      Weight:        173.9 lb Date of Birth:  1940/10/22      BSA:          1.802 m Patient Age:    82 years        BP:           105/93 mmHg Patient Gender: M               HR:           118 bpm. Exam Location:  Inpatient Procedure: 2D Echo, Cardiac Doppler, Color Doppler and Intracardiac            Opacification Agent Indications:    Atrial Fibrillation I48.91  History:        Patient has prior history of Echocardiogram examinations, most                 recent 01/29/2022. CHF, CAD and Previous Myocardial Infarction,                 ESRD, Arrythmias:Atrial Fibrillation and Tachycardia,                 Signs/Symptoms:Hypotension and Chest Pain; Risk                 Factors:Hypertension, Sleep Apnea, Diabetes and Dyslipidemia.  Sonographer:    Lucendia Herrlich RCS Referring Phys: Therisa Doyne IMPRESSIONS  1. Left ventricular ejection fraction, by estimation, is 25 to 30%. The left ventricle has severely decreased function. The left ventricle demonstrates regional wall motion abnormalities (see scoring diagram/findings for description). The left ventricular internal cavity size was moderately dilated. Left ventricular diastolic function could not be evaluated. There is akinesis of the left ventricular, entire apical segment. There is akinesis of the left ventricular, apical septal wall, anterior  wall, inferior wall and inferolateral wall.  2. Right ventricular systolic function is moderately reduced. The right ventricular size is mildly enlarged. There is moderately elevated pulmonary artery systolic pressure. The estimated right ventricular systolic pressure is 52.7 mmHg.  3. Left atrial size was moderately dilated.  4. Right atrial size was severely dilated.  5. The mitral valve is degenerative. Trivial mitral valve regurgitation. No evidence of mitral stenosis.  Moderate mitral annular calcification.  6. Tricuspid valve regurgitation is mild to moderate.  7. The aortic valve is calcified. There is moderate calcification of the  aortic valve. There is moderate thickening of the aortic valve. The left coronary cusp is fixed and immobile..     Aortic valve regurgitation is not visualized. No aortic stenosis is present. Aortic valve mean gradient measures 6.0 mmHg. Aortic valve Vmax measures 1.65 m/s. Although mean AVG is low, the DVI is 0.4 and SVI low at 13 indicating at least a mild to moderate degree of low flow low gradient AS. The AVA is underestimated due to small LVOT diameter measurement.  8. The inferior vena cava is dilated in size with <50% respiratory variability, suggesting right atrial pressure of 15 mmHg. FINDINGS  Left Ventricle: Left ventricular ejection fraction, by estimation, is 25 to 30%. The left ventricle has severely decreased function. The left ventricle demonstrates regional wall motion abnormalities. Definity contrast agent was given IV to delineate the left ventricular endocardial borders. The left ventricular internal cavity size was moderately dilated. There is no left ventricular hypertrophy. Left ventricular diastolic function could not be evaluated due to atrial fibrillation. Left ventricular diastolic function could not be evaluated. Right Ventricle: The right ventricular size is mildly enlarged. No increase in right ventricular wall thickness. Right ventricular systolic function is moderately reduced. There is moderately elevated pulmonary artery systolic pressure. The tricuspid regurgitant velocity is 3.07 m/s, and with an assumed right atrial pressure of 15 mmHg, the estimated right ventricular systolic pressure is 52.7 mmHg. Left Atrium: Left atrial size was moderately dilated. Right Atrium: Right atrial size was severely dilated. Pericardium: There is no evidence of pericardial effusion. Mitral Valve: The mitral valve is degenerative in appearance. There is mild thickening of the mitral valve leaflet(s). There is mild calcification of the mitral valve leaflet(s). Moderate mitral annular calcification.  Trivial mitral valve regurgitation. No evidence of mitral valve stenosis. Tricuspid Valve: The tricuspid valve is normal in structure. Tricuspid valve regurgitation is mild to moderate. No evidence of tricuspid stenosis. Aortic Valve: The left coronary cusp is fixed and immobile. The aortic valve is calcified. There is moderate calcification of the aortic valve. There is moderate thickening of the aortic valve. Aortic valve regurgitation is not visualized. Mild aortic stenosis is present. Aortic valve mean gradient measures 6.0 mmHg. Aortic valve peak gradient measures 10.9 mmHg. Aortic valve area, by VTI measures 0.92 cm. Pulmonic Valve: The pulmonic valve was normal in structure. Pulmonic valve regurgitation is trivial. No evidence of pulmonic stenosis. Aorta: The aortic root is normal in size and structure. Venous: The inferior vena cava is dilated in size with less than 50% respiratory variability, suggesting right atrial pressure of 15 mmHg. IAS/Shunts: No atrial level shunt detected by color flow Doppler.  LEFT VENTRICLE PLAX 2D LVIDd:         6.30 cm   Diastology LVIDs:         5.60 cm   LV e' medial:    5.27 cm/s LV PW:         0.95 cm   LV E/e' medial:  17.0 LV IVS:        0.70 cm   LV e' lateral:   6.65 cm/s LVOT diam:     1.70 cm   LV E/e' lateral: 13.5 LV SV:         23 LV SV Index:   13 LVOT Area:     2.27 cm  RIGHT VENTRICLE  IVC RV S prime:     10.30 cm/s  IVC diam: 3.00 cm TAPSE (M-mode): 1.1 cm LEFT ATRIUM             Index        RIGHT ATRIUM           Index LA diam:        4.55 cm 2.53 cm/m   RA Area:     23.50 cm LA Vol (A2C):   79.1 ml 43.91 ml/m  RA Volume:   76.50 ml  42.46 ml/m LA Vol (A4C):   70.8 ml 39.30 ml/m LA Biplane Vol: 78.5 ml 43.57 ml/m  AORTIC VALVE AV Area (Vmax):    1.17 cm AV Area (Vmean):   1.10 cm AV Area (VTI):     0.92 cm AV Vmax:           165.33 cm/s AV Vmean:          111.667 cm/s AV VTI:            0.252 m AV Peak Grad:      10.9 mmHg AV Mean Grad:       6.0 mmHg LVOT Vmax:         85.03 cm/s LVOT Vmean:        54.200 cm/s LVOT VTI:          0.102 m LVOT/AV VTI ratio: 0.40  AORTA Ao Root diam: 3.20 cm Ao Asc diam:  3.65 cm MITRAL VALVE               TRICUSPID VALVE MV Area (PHT): 5.42 cm    TR Peak grad:   37.7 mmHg MV Decel Time: 140 msec    TR Vmax:        307.00 cm/s MV E velocity: 89.70 cm/s MV A velocity: 33.40 cm/s  SHUNTS MV E/A ratio:  2.69        Systemic VTI:  0.10 m                            Systemic Diam: 1.70 cm Armanda Magic MD Electronically signed by Armanda Magic MD Signature Date/Time: 05/22/2023/9:29:30 AM    Final     Scheduled Meds:  (feeding supplement) PROSource Plus  30 mL Oral BID BM   allopurinol  100 mg Oral q AM   apixaban  2.5 mg Oral BID   atorvastatin  40 mg Oral QHS   Chlorhexidine Gluconate Cloth  6 each Topical Q0600   gabapentin  300 mg Oral QHS   insulin aspart  0-6 Units Subcutaneous Q4H   insulin aspart  2 Units Subcutaneous TID WC   insulin NPH Human  10 Units Subcutaneous QAC breakfast   midodrine  10 mg Oral TID WC   sodium chloride flush  3 mL Intravenous Q12H   tamsulosin  0.4 mg Oral QHS   Continuous Infusions:  amiodarone 30 mg/hr (05/23/23 0731)     LOS: 4 days    Time spent: 50 mins    Willeen Niece, MD [facility]   If 7PM-7AM, please contact night-coverage

## 2023-05-23 NOTE — Progress Notes (Signed)
[facility] Progress Note   Subjective: 3rd HD treatment in a row. BP soft but able to stay in UF. He says he has never felt "bad". Afib with HR 116-122 complicating treatment.   Objective Vitals:   05/23/23 1030 05/23/23 1100 05/23/23 1130 05/23/23 1200  BP: 100/61 (!) 86/54 (!) 87/58 92/61  Pulse: (!) 51 (!) 116 (!) 115 (!) 38  Resp: 14 15 14 18   Temp:      TempSrc:      SpO2: 100% 100% 100% 100%  Weight:      Height:       Physical Exam General: Chronically ill appearing male in NAD Heart: irreg, irreg No M/R/G. Afib on monitor. HR 100s Lungs: decreased in bases with few bibasilar crackles. No WOB Abdomen: obese, NABS Extremities: 2+ BLE pitting edema Pedal edema present, edema in abdomen Weeping noted RLE.  Dialysis Access: AVG + T/B  Additional Objective Labs: Basic Metabolic Panel: Recent Labs  Lab 05/19/23 0024 05/19/23 0848 05/20/23 0332 05/21/23 0433 05/22/23 1343 05/23/23 0928  NA  --  132*   < > 125* 123* 125*  K  --  5.1   < > 4.7 3.6 4.5  CL  --  95*   < > 90* 90* 89*  CO2  --  22   < > 18* 19* 21*  GLUCOSE  --  224*   < > 267* 335* 277*  BUN  --  39*   < > 69* 53* 55*  CREATININE  --  3.25*   < > 5.36* 3.69* 4.10*  CALCIUM  --  8.5*   < > 8.7* 7.5* 8.1*  PHOS 6.2* 6.5*  --   --  5.3*  --    < > = values in this interval not displayed.   Liver Function Tests: Recent Labs  Lab 05/18/23 1820 05/19/23 0848 05/21/23 0445 05/22/23 1343  AST 57* 51* 24  --   ALT 93* 78* 61*  --   ALKPHOS 197* 157* 161*  --   BILITOT 2.0* 1.9* 1.1  --   PROT 6.2* 5.4* 5.2*  --   ALBUMIN 3.0* 2.5* 2.3* 2.1*   No results for input(s): "LIPASE", "AMYLASE" in the last 168 hours. CBC: Recent Labs  Lab 05/19/23 0848 05/20/23 0332 05/21/23 0445 05/22/23 0501 05/23/23 0351  WBC 11.4* 15.1* 10.6* 7.2 6.8  HGB 11.8* 12.3* 11.9* 11.6* 11.5*  HCT 35.1* 36.4* 34.9* 34.1* 33.8*  MCV 103.5* 101.1* 100.0 100.9* 98.8  PLT 121* 112* 104* 71* 61*   Blood  Culture    Component Value Date/Time   SDES BLOOD RIGHT HAND 12/18/2022 0716   SPECREQUEST  12/18/2022 0716    BOTTLES DRAWN AEROBIC ONLY Blood Culture results may not be optimal due to an inadequate volume of blood received in culture bottles   CULT  12/18/2022 0716    NO GROWTH 5 DAYS Performed at [facility], [address] [place], [place] [address]    REPTSTATUS 12/23/2022 FINAL 12/18/2022 0716    Cardiac Enzymes: No results for input(s): "CKTOTAL", "CKMB", "CKMBINDEX", "TROPONINI" in the last 168 hours. CBG: Recent Labs  Lab 05/22/23 1724 05/22/23 2021 05/23/23 0025 05/23/23 0339 05/23/23 0809  GLUCAP 220* 305* 106* 144* 240*   Iron Studies: No results for input(s): "IRON", "TIBC", "TRANSFERRIN", "FERRITIN" in the last 72 hours. @lablastinr3 @ Studies/Results: ECHOCARDIOGRAM COMPLETE  Result Date: 05/22/2023    ECHOCARDIOGRAM REPORT   Patient Name:   MATEEN VANZEELAND Date of  Exam: 05/22/2023 Medical Rec #:  [id number]       Height:       62.0 in Accession #:    [id number]      Weight:       173.9 lb Date of Birth:  01-09-1941      BSA:          1.802 m Patient Age:    82 years        BP:           105/93 mmHg Patient Gender: M               HR:           118 bpm. Exam Location:  Inpatient Procedure: 2D Echo, Cardiac Doppler, Color Doppler and Intracardiac            Opacification Agent Indications:    Atrial Fibrillation I48.91  History:        Patient has prior history of Echocardiogram examinations, most                 recent 01/29/2022. CHF, CAD and Previous Myocardial Infarction,                 ESRD, Arrythmias:Atrial Fibrillation and Tachycardia,                 Signs/Symptoms:Hypotension and Chest Pain; Risk                 Factors:Hypertension, Sleep Apnea, Diabetes and Dyslipidemia.  Sonographer:    Lucendia Herrlich RCS Referring Phys: Therisa Doyne IMPRESSIONS  1. Left ventricular ejection fraction, by estimation, is 25 to 30%. The left ventricle has severely  decreased function. The left ventricle demonstrates regional wall motion abnormalities (see scoring diagram/findings for description). The left ventricular internal cavity size was moderately dilated. Left ventricular diastolic function could not be evaluated. There is akinesis of the left ventricular, entire apical segment. There is akinesis of the left ventricular, apical septal wall, anterior  wall, inferior wall and inferolateral wall.  2. Right ventricular systolic function is moderately reduced. The right ventricular size is mildly enlarged. There is moderately elevated pulmonary artery systolic pressure. The estimated right ventricular systolic pressure is 52.7 mmHg.  3. Left atrial size was moderately dilated.  4. Right atrial size was severely dilated.  5. The mitral valve is degenerative. Trivial mitral valve regurgitation. No evidence of mitral stenosis. Moderate mitral annular calcification.  6. Tricuspid valve regurgitation is mild to moderate.  7. The aortic valve is calcified. There is moderate calcification of the aortic valve. There is moderate thickening of the aortic valve. The left coronary cusp is fixed and immobile..     Aortic valve regurgitation is not visualized. No aortic stenosis is present. Aortic valve mean gradient measures 6.0 mmHg. Aortic valve Vmax measures 1.65 m/s. Although mean AVG is low, the DVI is 0.4 and SVI low at 13 indicating at least a mild to moderate degree of low flow low gradient AS. The AVA is underestimated due to small LVOT diameter measurement.  8. The inferior vena cava is dilated in size with <50% respiratory variability, suggesting right atrial pressure of 15 mmHg. FINDINGS  Left Ventricle: Left ventricular ejection fraction, by estimation, is 25 to 30%. The left ventricle has severely decreased function. The left ventricle demonstrates regional wall motion abnormalities. Definity contrast agent was given IV to delineate the left ventricular endocardial borders.  The left ventricular internal cavity size was moderately dilated. There is  no left ventricular hypertrophy. Left ventricular diastolic function could not be evaluated due to atrial fibrillation. Left ventricular diastolic function could not be evaluated. Right Ventricle: The right ventricular size is mildly enlarged. No increase in right ventricular wall thickness. Right ventricular systolic function is moderately reduced. There is moderately elevated pulmonary artery systolic pressure. The tricuspid regurgitant velocity is 3.07 m/s, and with an assumed right atrial pressure of 15 mmHg, the estimated right ventricular systolic pressure is 52.7 mmHg. Left Atrium: Left atrial size was moderately dilated. Right Atrium: Right atrial size was severely dilated. Pericardium: There is no evidence of pericardial effusion. Mitral Valve: The mitral valve is degenerative in appearance. There is mild thickening of the mitral valve leaflet(s). There is mild calcification of the mitral valve leaflet(s). Moderate mitral annular calcification. Trivial mitral valve regurgitation. No evidence of mitral valve stenosis. Tricuspid Valve: The tricuspid valve is normal in structure. Tricuspid valve regurgitation is mild to moderate. No evidence of tricuspid stenosis. Aortic Valve: The left coronary cusp is fixed and immobile. The aortic valve is calcified. There is moderate calcification of the aortic valve. There is moderate thickening of the aortic valve. Aortic valve regurgitation is not visualized. Mild aortic stenosis is present. Aortic valve mean gradient measures 6.0 mmHg. Aortic valve peak gradient measures 10.9 mmHg. Aortic valve area, by VTI measures 0.92 cm. Pulmonic Valve: The pulmonic valve was normal in structure. Pulmonic valve regurgitation is trivial. No evidence of pulmonic stenosis. Aorta: The aortic root is normal in size and structure. Venous: The inferior vena cava is dilated in size with less than 50% respiratory  variability, suggesting right atrial pressure of 15 mmHg. IAS/Shunts: No atrial level shunt detected by color flow Doppler.  LEFT VENTRICLE PLAX 2D LVIDd:         6.30 cm   Diastology LVIDs:         5.60 cm   LV e' medial:    5.27 cm/s LV PW:         0.95 cm   LV E/e' medial:  17.0 LV IVS:        0.70 cm   LV e' lateral:   6.65 cm/s LVOT diam:     1.70 cm   LV E/e' lateral: 13.5 LV SV:         23 LV SV Index:   13 LVOT Area:     2.27 cm  RIGHT VENTRICLE             IVC RV S prime:     10.30 cm/s  IVC diam: 3.00 cm TAPSE (M-mode): 1.1 cm LEFT ATRIUM             Index        RIGHT ATRIUM           Index LA diam:        4.55 cm 2.53 cm/m   RA Area:     23.50 cm LA Vol (A2C):   79.1 ml 43.91 ml/m  RA Volume:   76.50 ml  42.46 ml/m LA Vol (A4C):   70.8 ml 39.30 ml/m LA Biplane Vol: 78.5 ml 43.57 ml/m  AORTIC VALVE AV Area (Vmax):    1.17 cm AV Area (Vmean):   1.10 cm AV Area (VTI):     0.92 cm AV Vmax:           165.33 cm/s AV Vmean:          111.667 cm/s AV VTI:  0.252 m AV Peak Grad:      10.9 mmHg AV Mean Grad:      6.0 mmHg LVOT Vmax:         85.03 cm/s LVOT Vmean:        54.200 cm/s LVOT VTI:          0.102 m LVOT/AV VTI ratio: 0.40  AORTA Ao Root diam: 3.20 cm Ao Asc diam:  3.65 cm MITRAL VALVE               TRICUSPID VALVE MV Area (PHT): 5.42 cm    TR Peak grad:   37.7 mmHg MV Decel Time: 140 msec    TR Vmax:        307.00 cm/s MV E velocity: 89.70 cm/s MV A velocity: 33.40 cm/s  SHUNTS MV E/A ratio:  2.69        Systemic VTI:  0.10 m                            Systemic Diam: 1.70 cm Armanda Magic MD Electronically signed by Armanda Magic MD Signature Date/Time: 05/22/2023/9:29:30 AM    Final    Medications:  amiodarone 30 mg/hr (05/23/23 0731)    (feeding supplement) PROSource Plus  30 mL Oral BID BM   allopurinol  100 mg Oral q AM   apixaban  2.5 mg Oral BID   atorvastatin  40 mg Oral QHS   Chlorhexidine Gluconate Cloth  6 each Topical Q0600   gabapentin  300 mg Oral QHS   insulin  aspart  0-6 Units Subcutaneous Q4H   insulin aspart  2 Units Subcutaneous TID WC   insulin NPH Human  10 Units Subcutaneous QAC breakfast   midodrine  10 mg Oral TID WC   sodium chloride flush  3 mL Intravenous Q12H   tamsulosin  0.4 mg Oral QHS     HD orders:  NW MWF 400/auto 1.5 3K/2.5Ca EDW 68.1 4h -Heparin 2500 units IV three times per week  - Calcitriol 1.25 mcg PO  three times per week No ESA   Assessment/Plan: CONNERY NICKELL is an 82 y.o. male with ESRD on HD MWF, HTN, DM, CAD, combined CHF (2023 grade 1 DD and EF 45%) currently hospitalized with new A fib complicated by hypotension and nephrology is consulted for evaluation and management of ESRD.    A fib with RVR complicated by hypotension:  cardiology consulting - on amio now. TTE pending-will probably be done tomorrow.  CTA neg PE.     ESRD on HD MWF: MWF. Next HD 05/23/2023.    HFrEF with evidence of volume overload. EF 30-35% Cards following.    HTN: dx HTN at baseline, now with hypotension in setting of a fib, hold meds. On midodrine 10 mg PO TID, Needs volume removed but hypotension/uncontrolled AFIB impeding progress. Order extra treatment 3 hours sequential for volume removal only. UF as tolerated. Continue to attempt to optimize volume with HD.    Anemia of CKD:  Hb in 11s, no ESA needed outpt   BMM: cont outpt calcitriol and binder doses. Renal diet.    Nutrition: Very low albumin. Add protein supplements    Kaiah Hosea H. Shari Natt NP-C 05/23/2023, 12:44 PM  [employer] [phone]

## 2023-05-23 NOTE — Plan of Care (Signed)
  Problem: Coping: Goal: Ability to adjust to condition or change in health will improve Outcome: Progressing   Problem: Skin Integrity: Goal: Risk for impaired skin integrity will decrease Outcome: Progressing   

## 2023-05-23 NOTE — H&P (View-Only) (Signed)
Progress Note  Patient Name: Robert Lucas Date of Encounter: 05/23/2023 Primary Cardiologist: Elder Negus, MD   Subjective   Feels ok today  Vital Signs    Vitals:   05/23/23 0253 05/23/23 0410 05/23/23 0411 05/23/23 0735  BP:  (!) 85/63  (!) 80/63  Pulse:  (!) 109  (!) 121  Resp:  16  (!) 24  Temp:  97.9 F (36.6 C) 97.9 F (36.6 C) 97.7 F (36.5 C)  TempSrc:  Oral Oral Oral  SpO2:  99%  98%  Weight: 78.4 kg  74 kg   Height:        Intake/Output Summary (Last 24 hours) at 05/23/2023 0850 Last data filed at 05/23/2023 0505 Gross per 24 hour  Intake 661.82 ml  Output 2900 ml  Net -2238.18 ml   Filed Weights   05/22/23 1649 05/23/23 0253 05/23/23 0411  Weight: 75.9 kg 78.4 kg 74 kg    Physical Exam   General: Well developed, well nourished, male in no acute distress Head: Eyes PERRLA, Head normocephalic and atraumatic Lungs: rales bases bilaterally to auscultation. Heart: Irreg R&R, S1 S2, without rub or gallop. No murmur. 4/4 extremity pulses are 2+ & equal.  JVD 9 CM Abdomen: Bowel sounds are present, abdomen soft and non-tender without masses or  hernias noted. Msk: Normal strength and tone for age. Extremities: No clubbing, cyanosis, trace edema.    Skin:  No rashes or lesions noted. Neuro: Alert and oriented X 3. Psych:  Good affect, responds appropriately  Labs   Telemetry:  atrial fib, RVR, HR 100s-120s   Chemistry Recent Labs  Lab 05/18/23 1820 05/19/23 0848 05/20/23 0332 05/21/23 0433 05/21/23 0445 05/22/23 1343  NA 134* 132* 130* 125*  --  123*  K 4.5 5.1 4.6 4.7  --  3.6  CL 93* 95* 95* 90*  --  90*  CO2 26 22 20* 18*  --  19*  GLUCOSE 198* 224* 163* 267*  --  335*  BUN 27* 39* 52* 69*  --  53*  CREATININE 2.38* 3.25* 4.06* 5.36*  --  3.69*  CALCIUM 9.0 8.5* 8.9 8.7*  --  7.5*  PROT 6.2* 5.4*  --   --  5.2*  --   ALBUMIN 3.0* 2.5*  --   --  2.3* 2.1*  AST 57* 51*  --   --  24  --   ALT 93* 78*  --   --  61*  --    ALKPHOS 197* 157*  --   --  161*  --   BILITOT 2.0* 1.9*  --   --  1.1  --   GFRNONAA 26* 18* 14* 10*  --  15*  ANIONGAP 15 15 15  17*  --  14     Hematology Recent Labs  Lab 05/21/23 0445 05/22/23 0501 05/23/23 0351  WBC 10.6* 7.2 6.8  RBC 3.49* 3.38* 3.42*  HGB 11.9* 11.6* 11.5*  HCT 34.9* 34.1* 33.8*  MCV 100.0 100.9* 98.8  MCH 34.1* 34.3* 33.6  MCHC 34.1 34.0 34.0  RDW 16.9* 17.0* 17.2*  PLT 104* 71* 61*    Cardiac EnzymesNo results for input(s): "TROPONINI" in the last 168 hours. No results for input(s): "TROPIPOC" in the last 168 hours.   BNP Recent Labs  Lab 05/19/23 0024  BNP 2,343.1*     DDimer No results for input(s): "DDIMER" in the last 168 hours.   Cardiac Studies   Cardiac Studies & Procedures  ECHOCARDIOGRAM  ECHOCARDIOGRAM COMPLETE 05/22/2023  Narrative ECHOCARDIOGRAM REPORT    Patient Name:   Robert Lucas Date of Exam: 05/22/2023 Medical Rec #:  [id number]       Height:       62.0 in Accession #:    [id number]      Weight:       173.9 lb Date of Birth:  May 12, 1941      BSA:          1.802 m Patient Age:    82 years        BP:           105/93 mmHg Patient Gender: M               HR:           118 bpm. Exam Location:  Inpatient  Procedure: 2D Echo, Cardiac Doppler, Color Doppler and Intracardiac Opacification Agent  Indications:    Atrial Fibrillation I48.91  History:        Patient has prior history of Echocardiogram examinations, most recent 01/29/2022. CHF, CAD and Previous Myocardial Infarction, ESRD, Arrythmias:Atrial Fibrillation and Tachycardia, Signs/Symptoms:Hypotension and Chest Pain; Risk Factors:Hypertension, Sleep Apnea, Diabetes and Dyslipidemia.  Sonographer:    Lucendia Herrlich RCS Referring Phys: Therisa Doyne  IMPRESSIONS   1. Left ventricular ejection fraction, by estimation, is 25 to 30%. The left ventricle has severely decreased function. The left ventricle demonstrates regional wall motion  abnormalities (see scoring diagram/findings for description). The left ventricular internal cavity size was moderately dilated. Left ventricular diastolic function could not be evaluated. There is akinesis of the left ventricular, entire apical segment. There is akinesis of the left ventricular, apical septal wall, anterior wall, inferior wall and inferolateral wall. 2. Right ventricular systolic function is moderately reduced. The right ventricular size is mildly enlarged. There is moderately elevated pulmonary artery systolic pressure. The estimated right ventricular systolic pressure is 52.7 mmHg. 3. Left atrial size was moderately dilated. 4. Right atrial size was severely dilated. 5. The mitral valve is degenerative. Trivial mitral valve regurgitation. No evidence of mitral stenosis. Moderate mitral annular calcification. 6. Tricuspid valve regurgitation is mild to moderate. 7. The aortic valve is calcified. There is moderate calcification of the aortic valve. There is moderate thickening of the aortic valve. The left coronary cusp is fixed and immobile.. Aortic valve regurgitation is not visualized. No aortic stenosis is present. Aortic valve mean gradient measures 6.0 mmHg. Aortic valve Vmax measures 1.65 m/s. Although mean AVG is low, the DVI is 0.4 and SVI low at 13 indicating at least a mild to moderate degree of low flow low gradient AS. The AVA is underestimated due to small LVOT diameter measurement. 8. The inferior vena cava is dilated in size with <50% respiratory variability, suggesting right atrial pressure of 15 mmHg.  FINDINGS Left Ventricle: Left ventricular ejection fraction, by estimation, is 25 to 30%. The left ventricle has severely decreased function. The left ventricle demonstrates regional wall motion abnormalities. Definity contrast agent was given IV to delineate the left ventricular endocardial borders. The left ventricular internal cavity size was moderately dilated.  There is no left ventricular hypertrophy. Left ventricular diastolic function could not be evaluated due to atrial fibrillation. Left ventricular diastolic function could not be evaluated.  Right Ventricle: The right ventricular size is mildly enlarged. No increase in right ventricular wall thickness. Right ventricular systolic function is moderately reduced. There is moderately elevated pulmonary artery systolic pressure. The tricuspid regurgitant velocity is 3.07  m/s, and with an assumed right atrial pressure of 15 mmHg, the estimated right ventricular systolic pressure is 52.7 mmHg.  Left Atrium: Left atrial size was moderately dilated.  Right Atrium: Right atrial size was severely dilated.  Pericardium: There is no evidence of pericardial effusion.  Mitral Valve: The mitral valve is degenerative in appearance. There is mild thickening of the mitral valve leaflet(s). There is mild calcification of the mitral valve leaflet(s). Moderate mitral annular calcification. Trivial mitral valve regurgitation. No evidence of mitral valve stenosis.  Tricuspid Valve: The tricuspid valve is normal in structure. Tricuspid valve regurgitation is mild to moderate. No evidence of tricuspid stenosis.  Aortic Valve: The left coronary cusp is fixed and immobile. The aortic valve is calcified. There is moderate calcification of the aortic valve. There is moderate thickening of the aortic valve. Aortic valve regurgitation is not visualized. Mild aortic stenosis is present. Aortic valve mean gradient measures 6.0 mmHg. Aortic valve peak gradient measures 10.9 mmHg. Aortic valve area, by VTI measures 0.92 cm.  Pulmonic Valve: The pulmonic valve was normal in structure. Pulmonic valve regurgitation is trivial. No evidence of pulmonic stenosis.  Aorta: The aortic root is normal in size and structure.  Venous: The inferior vena cava is dilated in size with less than 50% respiratory variability, suggesting right  atrial pressure of 15 mmHg.  IAS/Shunts: No atrial level shunt detected by color flow Doppler.   LEFT VENTRICLE PLAX 2D LVIDd:         6.30 cm   Diastology LVIDs:         5.60 cm   LV e' medial:    5.27 cm/s LV PW:         0.95 cm   LV E/e' medial:  17.0 LV IVS:        0.70 cm   LV e' lateral:   6.65 cm/s LVOT diam:     1.70 cm   LV E/e' lateral: 13.5 LV SV:         23 LV SV Index:   13 LVOT Area:     2.27 cm   RIGHT VENTRICLE             IVC RV S prime:     10.30 cm/s  IVC diam: 3.00 cm TAPSE (M-mode): 1.1 cm  LEFT ATRIUM             Index        RIGHT ATRIUM           Index LA diam:        4.55 cm 2.53 cm/m   RA Area:     23.50 cm LA Vol (A2C):   79.1 ml 43.91 ml/m  RA Volume:   76.50 ml  42.46 ml/m LA Vol (A4C):   70.8 ml 39.30 ml/m LA Biplane Vol: 78.5 ml 43.57 ml/m AORTIC VALVE AV Area (Vmax):    1.17 cm AV Area (Vmean):   1.10 cm AV Area (VTI):     0.92 cm AV Vmax:           165.33 cm/s AV Vmean:          111.667 cm/s AV VTI:            0.252 m AV Peak Grad:      10.9 mmHg AV Mean Grad:      6.0 mmHg LVOT Vmax:         85.03 cm/s LVOT Vmean:        54.200  cm/s LVOT VTI:          0.102 m LVOT/AV VTI ratio: 0.40  AORTA Ao Root diam: 3.20 cm Ao Asc diam:  3.65 cm  MITRAL VALVE               TRICUSPID VALVE MV Area (PHT): 5.42 cm    TR Peak grad:   37.7 mmHg MV Decel Time: 140 msec    TR Vmax:        307.00 cm/s MV E velocity: 89.70 cm/s MV A velocity: 33.40 cm/s  SHUNTS MV E/A ratio:  2.69        Systemic VTI:  0.10 m Systemic Diam: 1.70 cm  Armanda Magic MD Electronically signed by Armanda Magic MD Signature Date/Time: 05/22/2023/9:29:30 AM    Final    MONITORS  LONG TERM MONITOR (3-14 DAYS) 04/06/2022  Narrative Mobile cardiac telemetry 13 days 03/16/2022 - 03/30/2022: Dominant rhythm: Sinus. HR 51-113 bpm. Avg HR 75 bpm, in sinus rhythm. >4000 episodes of probable atrial tachycardia, fastest at 200 bpm, avg 128 bpm, longest for 46  secs. 8.6% isolated SVE, 4.0% couplets, 2.0% triplets. 815 episodes of VT, fastest at 226 bpm for 5 beats, longest for 18 beats at 174 bpm. 5.7% isolated VE, <1% couplet/triplets. No atrial fibrillation/atrial flutter/high grade AV block, sinus pause >3sec noted. 0 patient triggered events.            Assessment & Plan   Atrial Fibrillation with Rapid Ventricular Response (Afib, RVR; new onset) - Continue IV amiodarone. -  Pt has limited good options for rate control. - Will plan for TEE/DCCV Thursday at 9:15, discussed with multidisciplinary teams and will time it around his Wednesday dialysis.  - pt has been consented   Acute on Chronic HF - volume management by HD - cannot tolerate GDMT (Hypotension and ESRD) - if rare are difficult to control despite HD Monday and Tuesday, consider TEE/DCCV, see above.   Hypotension - Experienced hypotension during HD - increased midodrine to TID. Received albumin during HD   For questions or updates, please contact [facility] Please consult [email or web address] for contact info under Cardiology/STEMI.   Theodore Demark PA-C  Patient seen and examined with RB PA-C.  Agree as above, with the following exceptions and changes as noted below. No complaints. Gen: NAD, CV: iRRR, Lungs: clear, Abd: soft, Neuro/Psych: alert and oriented x 3, normal mood and affect. All available labs, radiology testing, previous records reviewed. Will plan for tee/dccv tomorrow. Has been maintained on eliquis.   Parke Poisson, MD 05/23/23 6:58 PM

## 2023-05-23 NOTE — Progress Notes (Signed)
Physical Therapy Treatment Patient Details Name: Robert Lucas MRN: [id number] DOB: May 29, 1941 Today's Date: 05/23/2023   History of Present Illness 82 y.o. male presents to [facility] 05/18/23 w/ hypotension and a-fib w/ RVR. Prior admission in June for sepsis due to CAP. PMHx: ESRD on HD MWF, DM2, systolic/diastolic HF EF 30-35%, HLD, anemia, CAD, HTN, hx of MI, gout    PT Comments  Pt reports feeling weak in his legs today. Pt required extra time and increased assistance of maxA to transfer to stand the first rep. Pt was able to eventually progress to transferring to stand with min-modA with subsequent serial reps, but he continued to have difficulty primarily with extending his hips. He fatigued quickly when ambulating as well, flexing at his hips and needing to sit urgently to rest. +2 was needed to grab pt a chair to sit safely. He appears weaker and needs more assistance today than he had been needing the last couple days. Provided his wife can provide this level of assistance, pt could go home with HHPT (has been declining it though). If his family cannot provide this level of assistance safely though, pt may benefit from short-term inpatient rehab. He is at high risk for falls at this time. Will continue to follow acutely.    If plan is discharge home, recommend the following: A lot of help with walking and/or transfers;A lot of help with bathing/dressing/bathroom;Assistance with cooking/housework;Assist for transportation   Can travel by private vehicle        Equipment Recommendations  BSC/3in1    Recommendations for Other Services       Precautions / Restrictions Precautions Precautions: Fall Precaution Comments: monitor BP, HR Restrictions Weight Bearing Restrictions: No     Mobility  Bed Mobility Overal bed mobility: Needs Assistance Bed Mobility: Rolling, Sidelying to Sit Rolling: Min assist Sidelying to sit: Mod assist       General bed mobility comments: Rails  down and HOB very slightly elevated to simulate pt being propped up on pillows like his home set-up. Cues provided for pt to push through his L leg and reach with his L UE to R side of bed to semi-roll onto his R UE to push his trunk up to sit. MinA with pt pulling on therapist with his L UE to roll. ModA to ascend trunk    Transfers Overall transfer level: Needs assistance Equipment used: Rolling walker (2 wheels) Transfers: Sit to/from Stand Sit to Stand: Min assist, Mod assist, Max assist           General transfer comment: Cues provided for pt to scoot to edge, rock his trunk to gain momentum, and for proper and safe placement of his hands and feet with transfers. Pt tried to push up from bed as cued but unable to stand with maxA the first x2 attempts. Pt pulled up on RW the subsequent reps as he does at home. MaxA needed to extend his hips the first rep, but progressed to min-modA the subsequent x5 reps. Extra time needed for pt to extend his hips each rep.    Ambulation/Gait Ambulation/Gait assistance: Contact guard assist, Min assist, +2 safety/equipment, Mod assist Gait Distance (Feet): 20 Feet (x2 bouts of ~20 ft each bout) Assistive device: Rolling walker (2 wheels) Gait Pattern/deviations: Step-through pattern, Shuffle, Trunk flexed, Decreased stride length Gait velocity: dec Gait velocity interpretation: <1.31 ft/sec, indicative of household ambulator   General Gait Details: Pt takes slow, small, shuffling steps around the room, min guard-minA. Pt  fatigued after the first ~20 ft and began to flex at his hips and urgently needed to sit. +2 assist needed to get pt a chair while therapist supported pt in stand modA until it was safe to sit.   Stairs             Wheelchair Mobility     Tilt Bed    Modified Rankin (Stroke Patients Only)       Balance Overall balance assessment: Needs assistance Sitting-balance support: Feet supported, No upper extremity  supported Sitting balance-Leahy Scale: Good     Standing balance support: Bilateral upper extremity supported, During functional activity Standing balance-Leahy Scale: Poor Standing balance comment: reliant on RW                            Cognition Arousal: Alert Behavior During Therapy: WFL for tasks assessed/performed Overall Cognitive Status: Within Functional Limits for tasks assessed                                          Exercises      General Comments General comments (skin integrity, edema, etc.): HR up to 120s, BP soft (SBP in 80s, MAP in 60s), but seems stable to what it has been recently on the monitor, pt denied symptoms; educated pt to elevate legs and perform AROM of legs to manage edema noted      Pertinent Vitals/Pain Pain Assessment Pain Assessment: No/denies pain    Home Living                          Prior Function            PT Goals (current goals can now be found in the care plan section) Acute Rehab PT Goals Patient Stated Goal: to get better PT Goal Formulation: With patient Time For Goal Achievement: 06/03/23 Potential to Achieve Goals: Good Progress towards PT goals: Progressing toward goals    Frequency    Min 1X/week      PT Plan      Co-evaluation              AM-PAC PT "6 Clicks" Mobility   Outcome Measure  Help needed turning from your back to your side while in a flat bed without using bedrails?: A Little Help needed moving from lying on your back to sitting on the side of a flat bed without using bedrails?: A Lot Help needed moving to and from a bed to a chair (including a wheelchair)?: A Lot Help needed standing up from a chair using your arms (e.g., wheelchair or bedside chair)?: A Lot Help needed to walk in hospital room?: A Lot Help needed climbing 3-5 steps with a railing? : A Lot 6 Click Score: 13    End of Session Equipment Utilized During Treatment: Gait  belt Activity Tolerance: Patient tolerated treatment well;Patient limited by fatigue Patient left: in chair;with call bell/phone within reach;with chair alarm set Nurse Communication: Mobility status;Other (comment) (BP) PT Visit Diagnosis: Unsteadiness on feet (R26.81);Muscle weakness (generalized) (M62.81);Other abnormalities of gait and mobility (R26.89);Difficulty in walking, not elsewhere classified (R26.2)     Time: 9604-5409 PT Time Calculation (min) (ACUTE ONLY): 22 min  Charges:    $Therapeutic Activity: 8-22 mins PT General Charges $$ ACUTE PT VISIT: 1 Visit  Virgil Benedict, PT, DPT Acute Rehabilitation Services  Office: [phone]    Bettina Gavia 05/23/2023, 9:46 AM

## 2023-05-23 NOTE — Progress Notes (Signed)
   05/23/23 1412  Vitals  Temp 97.8 F (36.6 C)  Pulse Rate (!) 116  Resp 18  BP 101/79  SpO2 100 %  O2 Device Room Air  Weight 74.1 kg  Oxygen Therapy  Patient Activity (if Appropriate) In bed  Post Treatment  Dialyzer Clearance Clear  Liters Processed 96  Fluid Removed (mL) 2000 mL  Tolerated HD Treatment Yes  AVG/AVF Arterial Site Held (minutes) 15 minutes  AVG/AVF Venous Site Held (minutes) 15 minutes   Received patient in bed to unit.  Alert and oriented.  Informed consent signed and in chart.   TX duration: 4  Patient tolerated well.  Transported back to the room  Alert, without acute distress.  Hand-off given to patient's nurse.   Access used: Yes Access issues: No  Total UF removed: 2000 Medication(s) given: See MAR Post HD VS: See Above Grid Post HD weight: 74.1 kg   [facility]

## 2023-05-23 NOTE — Progress Notes (Addendum)
Progress Note  Patient Name: Robert Lucas Date of Encounter: 05/23/2023 Primary Cardiologist: Elder Negus, MD   Subjective   Feels ok today  Vital Signs    Vitals:   05/23/23 0253 05/23/23 0410 05/23/23 0411 05/23/23 0735  BP:  (!) 85/63  (!) 80/63  Pulse:  (!) 109  (!) 121  Resp:  16  (!) 24  Temp:  97.9 F (36.6 C) 97.9 F (36.6 C) 97.7 F (36.5 C)  TempSrc:  Oral Oral Oral  SpO2:  99%  98%  Weight: 78.4 kg  74 kg   Height:        Intake/Output Summary (Last 24 hours) at 05/23/2023 0850 Last data filed at 05/23/2023 0505 Gross per 24 hour  Intake 661.82 ml  Output 2900 ml  Net -2238.18 ml   Filed Weights   05/22/23 1649 05/23/23 0253 05/23/23 0411  Weight: 75.9 kg 78.4 kg 74 kg    Physical Exam   General: Well developed, well nourished, male in no acute distress Head: Eyes PERRLA, Head normocephalic and atraumatic Lungs: rales bases bilaterally to auscultation. Heart: Irreg R&R, S1 S2, without rub or gallop. No murmur. 4/4 extremity pulses are 2+ & equal.  JVD 9 CM Abdomen: Bowel sounds are present, abdomen soft and non-tender without masses or  hernias noted. Msk: Normal strength and tone for age. Extremities: No clubbing, cyanosis, trace edema.    Skin:  No rashes or lesions noted. Neuro: Alert and oriented X 3. Psych:  Good affect, responds appropriately  Labs   Telemetry:  atrial fib, RVR, HR 100s-120s   Chemistry Recent Labs  Lab 05/18/23 1820 05/19/23 0848 05/20/23 0332 05/21/23 0433 05/21/23 0445 05/22/23 1343  NA 134* 132* 130* 125*  --  123*  K 4.5 5.1 4.6 4.7  --  3.6  CL 93* 95* 95* 90*  --  90*  CO2 26 22 20* 18*  --  19*  GLUCOSE 198* 224* 163* 267*  --  335*  BUN 27* 39* 52* 69*  --  53*  CREATININE 2.38* 3.25* 4.06* 5.36*  --  3.69*  CALCIUM 9.0 8.5* 8.9 8.7*  --  7.5*  PROT 6.2* 5.4*  --   --  5.2*  --   ALBUMIN 3.0* 2.5*  --   --  2.3* 2.1*  AST 57* 51*  --   --  24  --   ALT 93* 78*  --   --  61*  --    ALKPHOS 197* 157*  --   --  161*  --   BILITOT 2.0* 1.9*  --   --  1.1  --   GFRNONAA 26* 18* 14* 10*  --  15*  ANIONGAP 15 15 15  17*  --  14     Hematology Recent Labs  Lab 05/21/23 0445 05/22/23 0501 05/23/23 0351  WBC 10.6* 7.2 6.8  RBC 3.49* 3.38* 3.42*  HGB 11.9* 11.6* 11.5*  HCT 34.9* 34.1* 33.8*  MCV 100.0 100.9* 98.8  MCH 34.1* 34.3* 33.6  MCHC 34.1 34.0 34.0  RDW 16.9* 17.0* 17.2*  PLT 104* 71* 61*    Cardiac EnzymesNo results for input(s): "TROPONINI" in the last 168 hours. No results for input(s): "TROPIPOC" in the last 168 hours.   BNP Recent Labs  Lab 05/19/23 0024  BNP 2,343.1*     DDimer No results for input(s): "DDIMER" in the last 168 hours.   Cardiac Studies   Cardiac Studies & Procedures  ECHOCARDIOGRAM  ECHOCARDIOGRAM COMPLETE 05/22/2023  Narrative ECHOCARDIOGRAM REPORT    Patient Name:   Robert Lucas Date of Exam: 05/22/2023 Medical Rec #:  [id number]       Height:       62.0 in Accession #:    [id number]      Weight:       173.9 lb Date of Birth:  May 12, 1941      BSA:          1.802 m Patient Age:    82 years        BP:           105/93 mmHg Patient Gender: M               HR:           118 bpm. Exam Location:  Inpatient  Procedure: 2D Echo, Cardiac Doppler, Color Doppler and Intracardiac Opacification Agent  Indications:    Atrial Fibrillation I48.91  History:        Patient has prior history of Echocardiogram examinations, most recent 01/29/2022. CHF, CAD and Previous Myocardial Infarction, ESRD, Arrythmias:Atrial Fibrillation and Tachycardia, Signs/Symptoms:Hypotension and Chest Pain; Risk Factors:Hypertension, Sleep Apnea, Diabetes and Dyslipidemia.  Sonographer:    Lucendia Herrlich RCS Referring Phys: Therisa Doyne  IMPRESSIONS   1. Left ventricular ejection fraction, by estimation, is 25 to 30%. The left ventricle has severely decreased function. The left ventricle demonstrates regional wall motion  abnormalities (see scoring diagram/findings for description). The left ventricular internal cavity size was moderately dilated. Left ventricular diastolic function could not be evaluated. There is akinesis of the left ventricular, entire apical segment. There is akinesis of the left ventricular, apical septal wall, anterior wall, inferior wall and inferolateral wall. 2. Right ventricular systolic function is moderately reduced. The right ventricular size is mildly enlarged. There is moderately elevated pulmonary artery systolic pressure. The estimated right ventricular systolic pressure is 52.7 mmHg. 3. Left atrial size was moderately dilated. 4. Right atrial size was severely dilated. 5. The mitral valve is degenerative. Trivial mitral valve regurgitation. No evidence of mitral stenosis. Moderate mitral annular calcification. 6. Tricuspid valve regurgitation is mild to moderate. 7. The aortic valve is calcified. There is moderate calcification of the aortic valve. There is moderate thickening of the aortic valve. The left coronary cusp is fixed and immobile.. Aortic valve regurgitation is not visualized. No aortic stenosis is present. Aortic valve mean gradient measures 6.0 mmHg. Aortic valve Vmax measures 1.65 m/s. Although mean AVG is low, the DVI is 0.4 and SVI low at 13 indicating at least a mild to moderate degree of low flow low gradient AS. The AVA is underestimated due to small LVOT diameter measurement. 8. The inferior vena cava is dilated in size with <50% respiratory variability, suggesting right atrial pressure of 15 mmHg.  FINDINGS Left Ventricle: Left ventricular ejection fraction, by estimation, is 25 to 30%. The left ventricle has severely decreased function. The left ventricle demonstrates regional wall motion abnormalities. Definity contrast agent was given IV to delineate the left ventricular endocardial borders. The left ventricular internal cavity size was moderately dilated.  There is no left ventricular hypertrophy. Left ventricular diastolic function could not be evaluated due to atrial fibrillation. Left ventricular diastolic function could not be evaluated.  Right Ventricle: The right ventricular size is mildly enlarged. No increase in right ventricular wall thickness. Right ventricular systolic function is moderately reduced. There is moderately elevated pulmonary artery systolic pressure. The tricuspid regurgitant velocity is 3.07  m/s, and with an assumed right atrial pressure of 15 mmHg, the estimated right ventricular systolic pressure is 52.7 mmHg.  Left Atrium: Left atrial size was moderately dilated.  Right Atrium: Right atrial size was severely dilated.  Pericardium: There is no evidence of pericardial effusion.  Mitral Valve: The mitral valve is degenerative in appearance. There is mild thickening of the mitral valve leaflet(s). There is mild calcification of the mitral valve leaflet(s). Moderate mitral annular calcification. Trivial mitral valve regurgitation. No evidence of mitral valve stenosis.  Tricuspid Valve: The tricuspid valve is normal in structure. Tricuspid valve regurgitation is mild to moderate. No evidence of tricuspid stenosis.  Aortic Valve: The left coronary cusp is fixed and immobile. The aortic valve is calcified. There is moderate calcification of the aortic valve. There is moderate thickening of the aortic valve. Aortic valve regurgitation is not visualized. Mild aortic stenosis is present. Aortic valve mean gradient measures 6.0 mmHg. Aortic valve peak gradient measures 10.9 mmHg. Aortic valve area, by VTI measures 0.92 cm.  Pulmonic Valve: The pulmonic valve was normal in structure. Pulmonic valve regurgitation is trivial. No evidence of pulmonic stenosis.  Aorta: The aortic root is normal in size and structure.  Venous: The inferior vena cava is dilated in size with less than 50% respiratory variability, suggesting right  atrial pressure of 15 mmHg.  IAS/Shunts: No atrial level shunt detected by color flow Doppler.   LEFT VENTRICLE PLAX 2D LVIDd:         6.30 cm   Diastology LVIDs:         5.60 cm   LV e' medial:    5.27 cm/s LV PW:         0.95 cm   LV E/e' medial:  17.0 LV IVS:        0.70 cm   LV e' lateral:   6.65 cm/s LVOT diam:     1.70 cm   LV E/e' lateral: 13.5 LV SV:         23 LV SV Index:   13 LVOT Area:     2.27 cm   RIGHT VENTRICLE             IVC RV S prime:     10.30 cm/s  IVC diam: 3.00 cm TAPSE (M-mode): 1.1 cm  LEFT ATRIUM             Index        RIGHT ATRIUM           Index LA diam:        4.55 cm 2.53 cm/m   RA Area:     23.50 cm LA Vol (A2C):   79.1 ml 43.91 ml/m  RA Volume:   76.50 ml  42.46 ml/m LA Vol (A4C):   70.8 ml 39.30 ml/m LA Biplane Vol: 78.5 ml 43.57 ml/m AORTIC VALVE AV Area (Vmax):    1.17 cm AV Area (Vmean):   1.10 cm AV Area (VTI):     0.92 cm AV Vmax:           165.33 cm/s AV Vmean:          111.667 cm/s AV VTI:            0.252 m AV Peak Grad:      10.9 mmHg AV Mean Grad:      6.0 mmHg LVOT Vmax:         85.03 cm/s LVOT Vmean:        54.200  cm/s LVOT VTI:          0.102 m LVOT/AV VTI ratio: 0.40  AORTA Ao Root diam: 3.20 cm Ao Asc diam:  3.65 cm  MITRAL VALVE               TRICUSPID VALVE MV Area (PHT): 5.42 cm    TR Peak grad:   37.7 mmHg MV Decel Time: 140 msec    TR Vmax:        307.00 cm/s MV E velocity: 89.70 cm/s MV A velocity: 33.40 cm/s  SHUNTS MV E/A ratio:  2.69        Systemic VTI:  0.10 m Systemic Diam: 1.70 cm  Armanda Magic MD Electronically signed by Armanda Magic MD Signature Date/Time: 05/22/2023/9:29:30 AM    Final    MONITORS  LONG TERM MONITOR (3-14 DAYS) 04/06/2022  Narrative Mobile cardiac telemetry 13 days 03/16/2022 - 03/30/2022: Dominant rhythm: Sinus. HR 51-113 bpm. Avg HR 75 bpm, in sinus rhythm. >4000 episodes of probable atrial tachycardia, fastest at 200 bpm, avg 128 bpm, longest for 46  secs. 8.6% isolated SVE, 4.0% couplets, 2.0% triplets. 815 episodes of VT, fastest at 226 bpm for 5 beats, longest for 18 beats at 174 bpm. 5.7% isolated VE, <1% couplet/triplets. No atrial fibrillation/atrial flutter/high grade AV block, sinus pause >3sec noted. 0 patient triggered events.            Assessment & Plan   Atrial Fibrillation with Rapid Ventricular Response (Afib, RVR; new onset) - Continue IV amiodarone. -  Pt has limited good options for rate control. - Will plan for TEE/DCCV Thursday at 9:15, discussed with multidisciplinary teams and will time it around his Wednesday dialysis.  - pt has been consented   Acute on Chronic HF - volume management by HD - cannot tolerate GDMT (Hypotension and ESRD) - if rare are difficult to control despite HD Monday and Tuesday, consider TEE/DCCV, see above.   Hypotension - Experienced hypotension during HD - increased midodrine to TID. Received albumin during HD   For questions or updates, please contact [facility] Please consult [email or web address] for contact info under Cardiology/STEMI.   Theodore Demark PA-C  Patient seen and examined with RB PA-C.  Agree as above, with the following exceptions and changes as noted below. No complaints. Gen: NAD, CV: iRRR, Lungs: clear, Abd: soft, Neuro/Psych: alert and oriented x 3, normal mood and affect. All available labs, radiology testing, previous records reviewed. Will plan for tee/dccv tomorrow. Has been maintained on eliquis.   Parke Poisson, MD 05/23/23 6:58 PM

## 2023-05-24 ENCOUNTER — Inpatient Hospital Stay (HOSPITAL_COMMUNITY): Payer: Medicare Other

## 2023-05-24 ENCOUNTER — Encounter (HOSPITAL_COMMUNITY)
Admission: EM | Disposition: A | Discharge status: Skilled Nursing Facility | Payer: Self-pay | Attending: Family Medicine

## 2023-05-24 DIAGNOSIS — I361 Nonrheumatic tricuspid (valve) insufficiency: Secondary | ICD-10-CM

## 2023-05-24 DIAGNOSIS — I340 Nonrheumatic mitral (valve) insufficiency: Secondary | ICD-10-CM | POA: Diagnosis not present

## 2023-05-24 DIAGNOSIS — I4891 Unspecified atrial fibrillation: Secondary | ICD-10-CM

## 2023-05-24 HISTORY — PX: TRANSESOPHAGEAL ECHOCARDIOGRAM (CATH LAB): EP1270

## 2023-05-24 HISTORY — PX: CARDIOVERSION: EP1203

## 2023-05-24 LAB — BASIC METABOLIC PANEL
Anion gap: 9 (ref 5–15)
BUN: 34 mg/dL — ABNORMAL HIGH (ref 8–23)
CO2: 26 mmol/L (ref 22–32)
Calcium: 8 mg/dL — ABNORMAL LOW (ref 8.9–10.3)
Chloride: 91 mmol/L — ABNORMAL LOW (ref 98–111)
Creatinine, Ser: 3.05 mg/dL — ABNORMAL HIGH (ref 0.61–1.24)
GFR, Estimated: 19 mL/min — ABNORMAL LOW (ref >60)
Glucose, Bld: 154 mg/dL — ABNORMAL HIGH (ref 70–99)
Potassium: 3.8 mmol/L (ref 3.5–5.1)
Sodium: 126 mmol/L — ABNORMAL LOW (ref 135–145)

## 2023-05-24 LAB — GLUCOSE, CAPILLARY
Glucose-Capillary: 154 mg/dL — ABNORMAL HIGH (ref 70–99)
Glucose-Capillary: 161 mg/dL — ABNORMAL HIGH (ref 70–99)
Glucose-Capillary: 113 mg/dL — ABNORMAL HIGH (ref 70–99)
Glucose-Capillary: 299 mg/dL — ABNORMAL HIGH (ref 70–99)
Glucose-Capillary: 254 mg/dL — ABNORMAL HIGH (ref 70–99)

## 2023-05-24 LAB — ECHO TEE: Est EF: <20

## 2023-05-24 LAB — MAGNESIUM: Magnesium: 1.7 mg/dL (ref 1.7–2.4)

## 2023-05-24 LAB — CBC
HCT: 33.1 % — ABNORMAL LOW (ref 39.0–52.0)
Hemoglobin: 11.6 g/dL — ABNORMAL LOW (ref 13.0–17.0)
MCH: 35.2 pg — ABNORMAL HIGH (ref 26.0–34.0)
MCHC: 35 g/dL (ref 30.0–36.0)
MCV: 100.3 fL — ABNORMAL HIGH (ref 80.0–100.0)
Platelets: 60 10*3/uL — ABNORMAL LOW (ref 150–400)
RBC: 3.3 MIL/uL — ABNORMAL LOW (ref 4.22–5.81)
RDW: 17.4 % — ABNORMAL HIGH (ref 11.5–15.5)
WBC: 6.1 10*3/uL (ref 4.0–10.5)
nRBC: 0.7 % — ABNORMAL HIGH (ref 0.0–0.2)

## 2023-05-24 LAB — HEPATITIS B SURFACE ANTIBODY, QUANTITATIVE: Hep B S AB Quant (Post): 237 m[IU]/mL

## 2023-05-24 LAB — PHOSPHORUS: Phosphorus: 4.4 mg/dL (ref 2.5–4.6)

## 2023-05-24 SURGERY — TRANSESOPHAGEAL ECHOCARDIOGRAM (TEE) (CATHLAB)
Anesthesia: Monitor Anesthesia Care

## 2023-05-24 MED ORDER — PROPOFOL 10 MG/ML IV BOLUS
INTRAVENOUS | Status: DC | PRN
  Administered 2023-05-24: 50 mg via INTRAVENOUS

## 2023-05-24 MED ORDER — LIDOCAINE 2% (20 MG/ML) 5 ML SYRINGE
INTRAMUSCULAR | Status: DC | PRN
  Administered 2023-05-24: 30 mg via INTRAVENOUS

## 2023-05-24 MED ORDER — AMIODARONE HCL 200 MG PO TABS
200.0000 mg | ORAL_TABLET | Freq: Every day | ORAL | Status: DC
Start: 2023-05-24 — End: 2023-05-30
  Administered 2023-05-24 – 2023-05-30 (×7): 200 mg via ORAL
  Filled 2023-05-24 (×7): qty 1

## 2023-05-24 MED ORDER — SODIUM CHLORIDE 0.9 % IV SOLN: INTRAVENOUS | Status: DC | PRN

## 2023-05-24 MED ORDER — VASOPRESSIN 20 UNIT/ML IV SOLN
INTRAVENOUS | Status: DC | PRN
  Administered 2023-05-24 (×2): 1 [IU] via INTRAVENOUS
  Administered 2023-05-24: .5 [IU] via INTRAVENOUS

## 2023-05-24 SURGICAL SUPPLY — 1 items: PAD DEFIB RADIO PHYSIO CONN (PAD) ×2 IMPLANT

## 2023-05-24 NOTE — Transfer of Care (Signed)
Immediate Anesthesia Transfer of Care Note  Patient: Robert Lucas  Procedure(s) Performed: TRANSESOPHAGEAL ECHOCARDIOGRAM CARDIOVERSION (CATH LAB)  Patient Location: PACU and Cath Lab  Anesthesia Type:General  Level of Consciousness: drowsy and patient cooperative  Airway & Oxygen Therapy: Patient Spontanous Breathing and Patient connected to nasal cannula oxygen  Post-op Assessment: Report given to RN and Post -op Vital signs reviewed and stable  Post vital signs: Reviewed and stable  Last Vitals:  Vitals Value Taken Time  BP 89/60 05/24/23 0848  Temp    Pulse 68 05/24/23 0850  Resp 13 05/24/23 0850  SpO2 100 % 05/24/23 0850  Vitals shown include unfiled device data.  Last Pain:  Vitals:   05/24/23 0720  TempSrc: Temporal  PainSc:          Complications: No notable events documented.

## 2023-05-24 NOTE — Plan of Care (Signed)
  Problem: Education: Goal: Ability to describe self-care measures that may prevent or decrease complications (Diabetes Survival Skills Education) will improve Outcome: Progressing Goal: Individualized Educational Video(s) Outcome: Progressing   Problem: Coping: Goal: Ability to adjust to condition or change in health will improve Outcome: Progressing   Problem: Fluid Volume: Goal: Ability to maintain a balanced intake and output will improve Outcome: Progressing   Problem: Health Behavior/Discharge Planning: Goal: Ability to identify and utilize available resources and services will improve Outcome: Progressing Goal: Ability to manage health-related needs will improve Outcome: Progressing   Problem: Metabolic: Goal: Ability to maintain appropriate glucose levels will improve Outcome: Progressing   Problem: Nutritional: Goal: Maintenance of adequate nutrition will improve Outcome: Progressing Goal: Progress toward achieving an optimal weight will improve Outcome: Progressing   Problem: Skin Integrity: Goal: Risk for impaired skin integrity will decrease Outcome: Progressing   Problem: Tissue Perfusion: Goal: Adequacy of tissue perfusion will improve Outcome: Progressing   Problem: Education: Goal: Knowledge of disease or condition will improve Outcome: Progressing Goal: Understanding of medication regimen will improve Outcome: Progressing Goal: Individualized Educational Video(s) Outcome: Progressing   Problem: Activity: Goal: Ability to tolerate increased activity will improve Outcome: Progressing   Problem: Cardiac: Goal: Ability to achieve and maintain adequate cardiopulmonary perfusion will improve Outcome: Progressing   Problem: Health Behavior/Discharge Planning: Goal: Ability to safely manage health-related needs after discharge will improve Outcome: Progressing   Problem: Education: Goal: Knowledge of General Education information will  improve Description: Including pain rating scale, medication(s)/side effects and non-pharmacologic comfort measures Outcome: Progressing   Problem: Health Behavior/Discharge Planning: Goal: Ability to manage health-related needs will improve Outcome: Progressing   Problem: Clinical Measurements: Goal: Ability to maintain clinical measurements within normal limits will improve Outcome: Progressing Goal: Will remain free from infection Outcome: Progressing Goal: Diagnostic test results will improve Outcome: Progressing Goal: Respiratory complications will improve Outcome: Progressing Goal: Cardiovascular complication will be avoided Outcome: Progressing   Problem: Activity: Goal: Risk for activity intolerance will decrease Outcome: Progressing   Problem: Nutrition: Goal: Adequate nutrition will be maintained Outcome: Progressing   Problem: Coping: Goal: Level of anxiety will decrease Outcome: Progressing   Problem: Elimination: Goal: Will not experience complications related to urinary retention Outcome: Progressing   Problem: Safety: Goal: Ability to remain free from injury will improve Outcome: Progressing   Problem: Skin Integrity: Goal: Risk for impaired skin integrity will decrease Outcome: Progressing

## 2023-05-24 NOTE — CV Procedure (Signed)
   Transesophageal Echocardiogram  Indications:AFIB  Time out performed  During this procedure the patient was administered propofol under anesthesiology supervision to achieve and maintain moderate sedation.  The patient's heart rate, blood pressure, and oxygen saturation are monitored continuously during the procedure.   Findings:  Left Ventricle: Ejection fraction 20% global hypokinesis  Mitral Valve: Mild mitral valve regurgitation, severe mitral valve annular calcification  Aortic Valve: Moderate aortic valve sclerosis, trileaflet  Tricuspid Valve: Severe tricuspid valve regurgitation  Left Atrium: Dilated, no left atrial appendage thrombus  Right Atrium: Dilated  Intraatrial septum: No shunt by color-flow Doppler  Bubble Contrast Study: Not performed  Donato Schultz, MD      Electrical Cardioversion Procedure Note AVION PAVIS [id number] 1941/05/20  Procedure: Electrical Cardioversion Indications:  Atrial Fibrillation  Time Out: Verified patient identification, verified procedure,medications/allergies/relevent history reviewed, required imaging and test results available.  Performed  Procedure Details  The patient was NPO after midnight. Anesthesia was administered at the beside  by Dr.Treen with propofol.  Cardioversion was performed with synchronized biphasic defibrillation via AP pads with 200, 250 joules.  2 attempt(s) were performed.  The patient converted to normal sinus rhythm. The patient tolerated the procedure well   IMPRESSION:  Successful cardioversion of atrial fibrillation    Donato Schultz 05/24/2023, 8:30 AM

## 2023-05-24 NOTE — Progress Notes (Signed)
Pt in SR with frequent PACs.  Transition amiodarone to po tomorrow, renal dosing per [facility]. Will use 200 mg daily.  With frequent pacs strongly suspect he will go back into afib with his dialysis physiology however hopefully will be able to maintain better BP. Pt may go to SNF, cardiology will follow peripherally moving forward.   Parke Poisson, MD

## 2023-05-24 NOTE — Progress Notes (Addendum)
[facility] Progress Note   Subjective: Up in chair post TEE. Still in Afib but rate controlled, better BP. No C/Os.      Objective Vitals:   05/24/23 0920 05/24/23 0921 05/24/23 0939 05/24/23 1230  BP: (!) 96/53  (!) 96/59 109/83  Pulse: 71  69 90  Resp: 20  (!) 21 19  Temp:  97.8 F (36.6 C) (!) 97.5 F (36.4 C)   TempSrc:  Temporal Oral   SpO2: 100%  100% 100%  Weight:      Height:       Physical Exam General: Chronically ill appearing male in NAD Heart: irreg, irreg No M/R/G. Afib on monitor. HR 100s Lungs: decreased in bases. No WOB Abdomen: obese, NABS Extremities: Now with only trace BLE edema Dialysis Access: AVG + T/B    Additional Objective Labs: Basic Metabolic Panel: Recent Labs  Lab 05/19/23 0848 05/20/23 0332 05/22/23 1343 05/23/23 0928 05/24/23 0319  NA 132*   < > 123* 125* 126*  K 5.1   < > 3.6 4.5 3.8  CL 95*   < > 90* 89* 91*  CO2 22   < > 19* 21* 26  GLUCOSE 224*   < > 335* 277* 154*  BUN 39*   < > 53* 55* 34*  CREATININE 3.25*   < > 3.69* 4.10* 3.05*  CALCIUM 8.5*   < > 7.5* 8.1* 8.0*  PHOS 6.5*  --  5.3*  --  4.4   < > = values in this interval not displayed.   Liver Function Tests: Recent Labs  Lab 05/18/23 1820 05/19/23 0848 05/21/23 0445 05/22/23 1343  AST 57* 51* 24  --   ALT 93* 78* 61*  --   ALKPHOS 197* 157* 161*  --   BILITOT 2.0* 1.9* 1.1  --   PROT 6.2* 5.4* 5.2*  --   ALBUMIN 3.0* 2.5* 2.3* 2.1*   No results for input(s): "LIPASE", "AMYLASE" in the last 168 hours. CBC: Recent Labs  Lab 05/20/23 0332 05/21/23 0445 05/22/23 0501 05/23/23 0351 05/24/23 0319  WBC 15.1* 10.6* 7.2 6.8 6.1  HGB 12.3* 11.9* 11.6* 11.5* 11.6*  HCT 36.4* 34.9* 34.1* 33.8* 33.1*  MCV 101.1* 100.0 100.9* 98.8 100.3*  PLT 112* 104* 71* 61* 60*   Blood Culture    Component Value Date/Time   SDES BLOOD RIGHT HAND 12/18/2022 0716   SPECREQUEST  12/18/2022 0716    BOTTLES DRAWN AEROBIC ONLY Blood Culture results may not  be optimal due to an inadequate volume of blood received in culture bottles   CULT  12/18/2022 0716    NO GROWTH 5 DAYS Performed at [facility], [address], [place], [place] [address]    REPTSTATUS 12/23/2022 FINAL 12/18/2022 0716    Cardiac Enzymes: No results for input(s): "CKTOTAL", "CKMB", "CKMBINDEX", "TROPONINI" in the last 168 hours. CBG: Recent Labs  Lab 05/23/23 2002 05/23/23 2335 05/24/23 0325 05/24/23 0729 05/24/23 1057  GLUCAP 178* 209* 154* 161* 113*   Iron Studies: No results for input(s): "IRON", "TIBC", "TRANSFERRIN", "FERRITIN" in the last 72 hours. @lablastinr3 @ Studies/Results: EP STUDY  Result Date: 05/24/2023 See surgical note for result.  Medications:  amiodarone 30 mg/hr (05/24/23 0855)    (feeding supplement) PROSource Plus  30 mL Oral BID BM   allopurinol  100 mg Oral q AM   apixaban  2.5 mg Oral BID   atorvastatin  40 mg Oral QHS   Chlorhexidine Gluconate Cloth  6 each Topical  Q0600   gabapentin  300 mg Oral QHS   insulin aspart  0-6 Units Subcutaneous Q4H   insulin aspart  2 Units Subcutaneous TID WC   insulin NPH Human  10 Units Subcutaneous QAC breakfast   midodrine  10 mg Oral TID WC   sodium chloride flush  3 mL Intravenous Q12H   tamsulosin  0.4 mg Oral QHS     HD orders:  NW MWF 400/auto 1.5 3K/2.5Ca EDW 68.1 4h -Heparin 2500 units IV three times per week  - Calcitriol 1.25 mcg PO  three times per week No ESA   Assessment/Plan: TERRALL HINZMAN is an 82 y.o. male with ESRD on HD MWF, HTN, DM, CAD, combined CHF (2023 grade 1 DD and EF 45%) currently hospitalized with new A fib complicated by hypotension and nephrology is consulted for evaluation and management of ESRD.    A fib with RVR complicated by hypotension:  cardiology consulting - on amio now. TTE completed today. No thrombus. CTA neg PE.     ESRD on HD MWF: MWF. Next HD 05/23/2023.    HFrEF with evidence of volume overload. EF 30-35% Cards following. EF 20%  by TEE today. Volume status improved after serial HD.    HTN: dx HTN at baseline, now with hypotension in setting of a fib, hold meds. On midodrine 10 mg PO TID, Volume much improved after 3 days of HD. Continue to UF as tolerated. Continue to attempt to optimize volume with HD.    Anemia of CKD:  Hb in 11s, no ESA needed outpt   BMM: cont outpt calcitriol and binder doses. Renal diet.    Nutrition: Very low albumin. Add protein supplements  Rondel Episcopo H. Tangia Pinard NP-C 05/24/2023, 1:56 PM  [employer] [phone]

## 2023-05-24 NOTE — Progress Notes (Addendum)
  Echocardiogram Transesophageal Echocardiogram has been performed.  Robert Lucas 05/24/2023, 8:44 AM

## 2023-05-24 NOTE — Interval H&P Note (Signed)
History and Physical Interval Note:  05/24/2023 8:13 AM  Robert Lucas  has presented today for surgery, with the diagnosis of afib.  The various methods of treatment have been discussed with the patient and family. After consideration of risks, benefits and other options for treatment, the patient has consented to  Procedure(s): TRANSESOPHAGEAL ECHOCARDIOGRAM (N/A) CARDIOVERSION (CATH LAB) (N/A) as a surgical intervention.  The patient's history has been reviewed, patient examined, no change in status, stable for surgery.  I have reviewed the patient's chart and labs.  Questions were answered to the patient's satisfaction.     [employer]

## 2023-05-24 NOTE — Progress Notes (Signed)
PROGRESS NOTE    Robert Lucas  MVH:[id number] DOB: Dec 23, 1940 DOA: 05/18/2023 PCP: Joycelyn Rua, MD   Brief Narrative:  This 82 yrs old male with PMH significant for ESRD on HD MWF ,  DM2, Combined systolic/diastolic heart failure with LVEF 30-35%, HLD, anemia, CAD, HTN, hx. of MI, gout, Presented with hypotension, atrial fibrillation with RVR. He was started on IV amiodarone and  IV heparin,subsequently transitioned to eliqus.  He is scheduled for DCCV on Thursday.   Assessment & Plan:   Principal Problem:   Atrial fibrillation with RVR (HCC) Active Problems:   DM2 (diabetes mellitus, type 2) (HCC)   Essential hypertension   Hyperlipidemia   Coronary artery disease involving native coronary artery of native heart without angina pectoris   Chronic combined systolic and diastolic CHF (congestive heart failure) (HCC)   ESRD (end stage renal disease) (HCC)   Acute respiratory failure with hypoxia (HCC)   Hypotension  Atrial fibrillation with RVR: Heart rate not well-controlled.   Initially started on IV Cardizem then transitioned to amiodarone gtt.due to hypotension. He remains in afib with rates between 110 to 120/min.  Cardiology on board,recommended TEE/DCCV TSH > WNL. Echocardiogram reviewed.  LVEF 25 to 30%, regional wall motion abnormalities. He underwent successful DCCV today , tolerated well.  Acute on chronic combined CHF: Continue Fluid management with HD. Nephrology on board.  Continue midodrine10 mg 3 times daily.   Hypotension:  BP parameters are borderline low.  Continue Midodrine.    Diabetes Mellitus type 2: Uncontrolled with hyperglycemia  Continue with Novolin N 10 units daily, SSI and 2 units of Novolog added TIDAC.  HbA1c is 8.8. Carb modified diet.   ESRD on HD: Nephrology on board and appreciate recommendations.  Continue hemodialysis as per schedule.   Hyperlipidemia: Continue statin.    BPH: Continue with flomax.     Hypervolemic  Hyponatremia: Likely from ESRD and fluid overload.  Continue hemodialysis as per schedule.   Elevated liver enzymes Increased liver echotexture consistent with hepatic steatosis.  Improved. Bilirubin wnl.  Could be due to CHF. Continue to monitor liver enzymes   Mild leukocytosis: Resolved.   Elevated lactic acid  possibly from CHF. Repeat in am.    Obesity: Estimated body mass index is 30.6 kg/m as calculated from the following:   Height as of this encounter: 5\' 2"  (1.575 m).   Weight as of this encounter: 75.9 kg.  Thrombocytopenia: Monitor platelet count.  No signs of active bleeding.  DVT prophylaxis: Eliquis Code Status:Full code Family Communication: No family at bed side. Disposition Plan:    Status is: Inpatient Remains inpatient appropriate because: Admitted for A-fib with RVR and hypotension.  Cardiology is consulted,  started on amiodarone, Patient remains on Eliquis.   He remains on hemodialysis as per schedule. He underwent successful cardioversion.   Consultants:  Cardiology Nephrology  Procedures:  scheduled DCCV 11/21 Antimicrobials:  Anti-infectives (From admission, onward)    None       Subjective: Patient was seen and examined at bedside. Overnight events noted.   Patient reports doing better,  Heart rate is now controlled. He underwent successful cardioversion to NSR.  Objective: Vitals:   05/24/23 0920 05/24/23 0921 05/24/23 0939 05/24/23 1230  BP: (!) 96/53  (!) 96/59 109/83  Pulse: 71  69 90  Resp: 20  (!) 21 19  Temp:  97.8 F (36.6 C) (!) 97.5 F (36.4 C)   TempSrc:  Temporal Oral   SpO2: 100%  100% 100%  Weight:      Height:        Intake/Output Summary (Last 24 hours) at 05/24/2023 1349 Last data filed at 05/24/2023 1300 Gross per 24 hour  Intake 778.41 ml  Output 2001 ml  Net -1222.59 ml   Filed Weights   05/23/23 0952 05/23/23 1412 05/24/23 0324  Weight: 76.3 kg 74.1 kg 75.6 kg    Examination: General exam:  Appears calm and comfortable , deconditioned, not in any distress Respiratory system: Clear to auscultation. Respiratory effort normal.  RR 15 Cardiovascular system: S1 & S2 heard, RRR. No JVD, murmurs, rubs, gallops or clicks. No pedal edema. Gastrointestinal system: Abdomen is non distended, soft and non tender. Normal bowel sounds heard. Central nervous system: Alert and oriented x 3. No focal neurological deficits. Extremities: No edema, No cyanosis, No clubbing Skin: No rashes, lesions or ulcers Psychiatry: Judgement and insight appear normal. Mood & affect appropriate.     Data Reviewed: I have personally reviewed following labs and imaging studies  CBC: Recent Labs  Lab 05/20/23 0332 05/21/23 0445 05/22/23 0501 05/23/23 0351 05/24/23 0319  WBC 15.1* 10.6* 7.2 6.8 6.1  HGB 12.3* 11.9* 11.6* 11.5* 11.6*  HCT 36.4* 34.9* 34.1* 33.8* 33.1*  MCV 101.1* 100.0 100.9* 98.8 100.3*  PLT 112* 104* 71* 61* 60*   Basic Metabolic Panel: Recent Labs  Lab 05/19/23 0024 05/19/23 0848 05/20/23 0332 05/21/23 0433 05/22/23 1343 05/23/23 0928 05/24/23 0319  NA  --  132* 130* 125* 123* 125* 126*  K  --  5.1 4.6 4.7 3.6 4.5 3.8  CL  --  95* 95* 90* 90* 89* 91*  CO2  --  22 20* 18* 19* 21* 26  GLUCOSE  --  224* 163* 267* 335* 277* 154*  BUN  --  39* 52* 69* 53* 55* 34*  CREATININE  --  3.25* 4.06* 5.36* 3.69* 4.10* 3.05*  CALCIUM  --  8.5* 8.9 8.7* 7.5* 8.1* 8.0*  MG 2.0 2.1  --   --   --   --  1.7  PHOS 6.2* 6.5*  --   --  5.3*  --  4.4   GFR: Estimated Creatinine Clearance: 16.1 mL/min (A) (by C-G formula based on SCr of 3.05 mg/dL (H)). Liver Function Tests: Recent Labs  Lab 05/18/23 1820 05/19/23 0848 05/21/23 0445 05/22/23 1343  AST 57* 51* 24  --   ALT 93* 78* 61*  --   ALKPHOS 197* 157* 161*  --   BILITOT 2.0* 1.9* 1.1  --   PROT 6.2* 5.4* 5.2*  --   ALBUMIN 3.0* 2.5* 2.3* 2.1*   No results for input(s): "LIPASE", "AMYLASE" in the last 168 hours. No results for  input(s): "AMMONIA" in the last 168 hours. Coagulation Profile: Recent Labs  Lab 05/19/23 0024  INR 1.2   Cardiac Enzymes: No results for input(s): "CKTOTAL", "CKMB", "CKMBINDEX", "TROPONINI" in the last 168 hours. BNP (last 3 results) No results for input(s): "PROBNP" in the last 8760 hours. HbA1C: No results for input(s): "HGBA1C" in the last 72 hours. CBG: Recent Labs  Lab 05/23/23 2002 05/23/23 2335 05/24/23 0325 05/24/23 0729 05/24/23 1057  GLUCAP 178* 209* 154* 161* 113*   Lipid Profile: No results for input(s): "CHOL", "HDL", "LDLCALC", "TRIG", "CHOLHDL", "LDLDIRECT" in the last 72 hours. Thyroid Function Tests: No results for input(s): "TSH", "T4TOTAL", "FREET4", "T3FREE", "THYROIDAB" in the last 72 hours. Anemia Panel: No results for input(s): "VITAMINB12", "FOLATE", "FERRITIN", "TIBC", "IRON", "RETICCTPCT" in the last 72 hours. Sepsis  Labs: Recent Labs  Lab 05/19/23 0024 05/19/23 0421 05/19/23 1417 05/21/23 0445  LATICACIDVEN 2.8* 2.8* 4.0* 2.3*    Recent Results (from the past 240 hour(s))  MRSA Next Gen by PCR, Nasal     Status: None   Collection Time: 05/22/23  4:40 AM   Specimen: Nasal Mucosa; Nasal Swab  Result Value Ref Range Status   MRSA by PCR Next Gen NOT DETECTED NOT DETECTED Final    Comment: (NOTE) The GeneXpert MRSA Assay (FDA approved for NASAL specimens only), is one component of a comprehensive MRSA colonization surveillance program. It is not intended to diagnose MRSA infection nor to guide or monitor treatment for MRSA infections. Test performance is not FDA approved in patients less than 18 years old. Performed at [facility], [address], [place], [place] [address]     Radiology Studies: EP STUDY  Result Date: 05/24/2023 See surgical note for result.   Scheduled Meds:  (feeding supplement) PROSource Plus  30 mL Oral BID BM   allopurinol  100 mg Oral q AM   apixaban  2.5 mg Oral BID   atorvastatin  40 mg Oral QHS    Chlorhexidine Gluconate Cloth  6 each Topical Q0600   gabapentin  300 mg Oral QHS   insulin aspart  0-6 Units Subcutaneous Q4H   insulin aspart  2 Units Subcutaneous TID WC   insulin NPH Human  10 Units Subcutaneous QAC breakfast   midodrine  10 mg Oral TID WC   sodium chloride flush  3 mL Intravenous Q12H   tamsulosin  0.4 mg Oral QHS   Continuous Infusions:  amiodarone 30 mg/hr (05/24/23 0855)     LOS: 5 days    Time spent: 35 mins    Willeen Niece, MD [facility]   If 7PM-7AM, please contact night-coverage

## 2023-05-24 NOTE — Progress Notes (Signed)
Physical Therapy Treatment Patient Details Name: Robert Lucas MRN: [id number] DOB: Mar 25, 1941 Today's Date: 05/24/2023   History of Present Illness 82 y.o. male [facility]/18/23 w/ hypotension and a-fib w/ RVR. Prior admission in June for sepsis due to CAP. PMHx: ESRD on HD MWF, DM2, systolic/diastolic HF EF 30-35%, HLD, anemia, CAD, HTN, hx of MI, gout    PT Comments  Pt continues to demonstrate deficits in strength and endurance that place him at high risk for falls. He needs heavy modA to extend his trunk/hips, maintain his balance, and keep the RW still while he transfers to stand from lower surfaces. He also needed modA to safely direct his hips to the chair when he suddenly fatigued when ambulating in the room again. He was able to ambulate double to distance today compared to yesterday though, indicating improved activity tolerance. Will continue to follow acutely. Pt would benefit from short-term inpatient rehab, < 3 hours/day.    If plan is discharge home, recommend the following: A lot of help with walking and/or transfers;A lot of help with bathing/dressing/bathroom;Assistance with cooking/housework;Assist for transportation   Can travel by private vehicle     Yes  Equipment Recommendations  BSC/3in1    Recommendations for Other Services       Precautions / Restrictions Precautions Precautions: Fall Precaution Comments: monitor BP, HR Restrictions Weight Bearing Restrictions: No     Mobility  Bed Mobility Overal bed mobility: Needs Assistance Bed Mobility: Supine to Sit, Rolling Rolling: Mod assist, Used rails   Supine to sit: Mod assist, HOB elevated, Used rails     General bed mobility comments: Pt able to bring legs off R EOB but needed modA to ascend trunk, cuing pt to get R elbow under trunk to then push up through R UE to ascend trunk. ModA to rotate hips to roll for pericare    Transfers Overall transfer level: Needs assistance Equipment used:  Rolling walker (2 wheels) Transfers: Sit to/from Stand Sit to Stand: Min assist, Mod assist, From elevated surface           General transfer comment: Pt pulled up on RW to stand each rep. MinA to power up to stand from elevated EOB with better hip extension strength/power upon standing this rep. However, upon standing from recliner, pt had extreme difficulty extending his hips to stand upright, needing extra time and heavy modA to maintain his balance, cue extension of hips, and keep RW still    Ambulation/Gait Ambulation/Gait assistance: Min assist, Mod assist, Contact guard assist Gait Distance (Feet): 40 Feet Assistive device: Rolling walker (2 wheels) Gait Pattern/deviations: Step-through pattern, Shuffle, Trunk flexed, Decreased stride length Gait velocity: dec Gait velocity interpretation: <1.31 ft/sec, indicative of household ambulator   General Gait Details: Pt takes slow, small, shuffling steps around the room. CGA-minA majority of time until pt fatigued and began to flex more at the trunk and sit prematurely, needing modA to swing his hips safely to the chair.   Stairs             Wheelchair Mobility     Tilt Bed    Modified Rankin (Stroke Patients Only)       Balance Overall balance assessment: Needs assistance Sitting-balance support: Feet supported, No upper extremity supported Sitting balance-Leahy Scale: Good     Standing balance support: Bilateral upper extremity supported, During functional activity Standing balance-Leahy Scale: Poor Standing balance comment: reliant on RW  Cognition Arousal: Alert Behavior During Therapy: WFL for tasks assessed/performed Overall Cognitive Status: Within Functional Limits for tasks assessed                                          Exercises      General Comments General comments (skin integrity, edema, etc.): HR in 80s, BP 108/73 supine, 108/65  standing, 92/54 sitting end of session, asymptomatic throughout      Pertinent Vitals/Pain Pain Assessment Pain Assessment: Faces Faces Pain Scale: Hurts a little bit Pain Location: generalized Pain Descriptors / Indicators: Discomfort, Grimacing Pain Intervention(s): Limited activity within patient's tolerance, Monitored during session, Repositioned    Home Living                          Prior Function            PT Goals (current goals can now be found in the care plan section) Acute Rehab PT Goals Patient Stated Goal: to get better PT Goal Formulation: With patient Time For Goal Achievement: 06/03/23 Potential to Achieve Goals: Good Progress towards PT goals: Progressing toward goals    Frequency    Min 1X/week      PT Plan      Co-evaluation              AM-PAC PT "6 Clicks" Mobility   Outcome Measure  Help needed turning from your back to your side while in a flat bed without using bedrails?: A Little Help needed moving from lying on your back to sitting on the side of a flat bed without using bedrails?: A Lot Help needed moving to and from a bed to a chair (including a wheelchair)?: A Lot Help needed standing up from a chair using your arms (e.g., wheelchair or bedside chair)?: A Lot Help needed to walk in hospital room?: A Lot Help needed climbing 3-5 steps with a railing? : A Lot 6 Click Score: 13    End of Session Equipment Utilized During Treatment: Gait belt Activity Tolerance: Patient tolerated treatment well Patient left: in chair;with call bell/phone within reach;with chair alarm set   PT Visit Diagnosis: Unsteadiness on feet (R26.81);Muscle weakness (generalized) (M62.81);Other abnormalities of gait and mobility (R26.89);Difficulty in walking, not elsewhere classified (R26.2)     Time: 4098-1191 PT Time Calculation (min) (ACUTE ONLY): 36 min  Charges:    $Gait Training: 8-22 mins $Therapeutic Activity: 8-22 mins PT  General Charges $$ ACUTE PT VISIT: 1 Visit                     Virgil Benedict, PT, DPT Acute Rehabilitation Services  Office: [phone]    Bettina Gavia 05/24/2023, 3:25 PM

## 2023-05-24 NOTE — Anesthesia Preprocedure Evaluation (Addendum)
Anesthesia Evaluation  Patient identified by MRN, date of birth, ID band Patient awake    Reviewed: Allergy & Precautions, H&P , NPO status , Patient's Chart, lab work & pertinent test results  History of Anesthesia Complications (+) PONV and history of anesthetic complications  Airway Mallampati: II  TM Distance: >3 FB Neck ROM: Full    Dental no notable dental hx. (+) Poor Dentition, Dental Advisory Given,    Pulmonary sleep apnea , former smoker   Pulmonary exam normal breath sounds clear to auscultation       Cardiovascular hypertension, pulmonary hypertension+ CAD, + Past MI, + Peripheral Vascular Disease and +CHF  Normal cardiovascular exam+ dysrhythmias Atrial Fibrillation  Rhythm:Regular Rate:Normal  1. Left ventricular ejection fraction, by estimation, is 25 to 30%. The  left ventricle has severely decreased function. The left ventricle  demonstrates regional wall motion abnormalities (see scoring  diagram/findings for description). The left  ventricular internal cavity size was moderately dilated. Left ventricular  diastolic function could not be evaluated. There is akinesis of the left  ventricular, entire apical segment. There is akinesis of the left  ventricular, apical septal wall, anterior   wall, inferior wall and inferolateral wall.   2. Right ventricular systolic function is moderately reduced. The right  ventricular size is mildly enlarged. There is moderately elevated  pulmonary artery systolic pressure. The estimated right ventricular  systolic pressure is 52.7 mmHg.   3. Left atrial size was moderately dilated.   4. Right atrial size was severely dilated.   5. The mitral valve is degenerative. Trivial mitral valve regurgitation.  No evidence of mitral stenosis. Moderate mitral annular calcification.   6. Tricuspid valve regurgitation is mild to moderate.   7. The aortic valve is calcified. There is  moderate calcification of the  aortic valve. There is moderate thickening of the aortic valve. The left  coronary cusp is fixed and immobile..      Aortic valve regurgitation is not visualized. No aortic stenosis is  present. Aortic valve mean gradient measures 6.0 mmHg. Aortic valve Vmax  measures 1.65 m/s. Although mean AVG is low, the DVI is 0.4 and SVI low at  13 indicating at least a mild to  moderate degree of low flow low gradient AS. The AVA is underestimated due  to small LVOT diameter measurement.   8. The inferior vena cava is dilated in size with <50% respiratory  variability, suggesting right atrial pressure of 15 mmHg.     Neuro/Psych  negative psych ROS   GI/Hepatic negative GI ROS, Neg liver ROS,,,  Endo/Other  diabetes, Type 2    Renal/GU ESRFRenal disease  negative genitourinary   Musculoskeletal  (+) Arthritis ,    Abdominal   Peds negative pediatric ROS (+)  Hematology negative hematology ROS (+)   Anesthesia Other Findings   Reproductive/Obstetrics negative OB ROS                             Anesthesia Physical Anesthesia Plan  ASA: 4  Anesthesia Plan: MAC   Post-op Pain Management:    Induction: Intravenous  PONV Risk Score and Plan: 2 and Propofol infusion and Treatment may vary due to age or medical condition  Airway Management Planned: Natural Airway  Additional Equipment:   Intra-op Plan:   Post-operative Plan:   Informed Consent: I have reviewed the patients History and Physical, chart, labs and discussed the procedure including the risks, benefits and  alternatives for the proposed anesthesia with the patient or authorized representative who has indicated his/her understanding and acceptance.     Dental advisory given  Plan Discussed with: CRNA  Anesthesia Plan Comments:         Anesthesia Quick Evaluation

## 2023-05-24 NOTE — Anesthesia Postprocedure Evaluation (Signed)
Anesthesia Post Note  Patient: Robert Lucas  Procedure(s) Performed: TRANSESOPHAGEAL ECHOCARDIOGRAM CARDIOVERSION (CATH LAB)     Patient location during evaluation: PACU Anesthesia Type: MAC Level of consciousness: awake and alert Pain management: pain level controlled Vital Signs Assessment: post-procedure vital signs reviewed and stable Respiratory status: spontaneous breathing, nonlabored ventilation, respiratory function stable and patient connected to nasal cannula oxygen Cardiovascular status: stable and blood pressure returned to baseline Postop Assessment: no apparent nausea or vomiting Anesthetic complications: no   No notable events documented.  Last Vitals:  Vitals:   05/24/23 0855 05/24/23 0900  BP: (!) 83/55 96/72  Pulse: 67 73  Resp: 13 20  Temp:    SpO2: 100% 100%    Last Pain:  Vitals:   05/24/23 0851  TempSrc: Temporal  PainSc: Asleep                 [place]

## 2023-05-24 NOTE — TOC Progression Note (Addendum)
Transition of Care [facility]) - Progression Note    Patient Details  Name: Robert Lucas MRN: [id number] Date of Birth: [birth year]  Transition [facility] Care [facility]) CM/SW Contact  Michaela Corner, [place] Phone Number: 05/24/2023, 11:05 AM  Clinical Narrative:   CSW met pt at bedside. Pt states he is from [facility]. CSW called [facility] and confirmed pt is from their independent living side. Pt can transition to SNF if needed - [facility] states that have 1 bed available at [facility]. CSW waiting for final PT recs.   CM/CSW called pts wife - no answer. CM left pts wife a VM to return call.   1:30PM: CSW spoke with pt, PT recs will be changed to SNF, pt wants [facility] [place] - [facility] on [facility].  If pt unable to go to SNF, backup plan is to go home to IDL w/ wife.   2:44PM: CSW spoke with Milda Smart [phone])  at [facility] and they are in network with pts insurance. Rikki states they can make a bed offer next week and will need initial referral.     Expected Discharge Plan: Home w Home Health Services Barriers to Discharge: Continued Medical Work up  Expected Discharge Plan and Services In-house Referral: NA Discharge Planning Services: CM Consult Post Acute Care Choice: NA Living arrangements for the past 2 months: Single Family Home                 DME Arranged: N/A DME Agency: NA       HH Arranged: PT, OT, Patient Refused HH           Social Determinants of Health (SDOH) Interventions SDOH Screenings   Food Insecurity: No Food Insecurity (05/19/2023)  Housing: Low Risk  (05/19/2023)  Transportation Needs: No Transportation Needs (05/19/2023)  Utilities: Not At Risk (05/19/2023)  Tobacco Use: Medium Risk (05/19/2023)    Readmission Risk Interventions    05/21/2023    4:06 PM 12/20/2022    1:38 PM  Readmission Risk Prevention Plan  Transportation Screening Complete Complete  PCP or  Specialist Appt within 5-7 Days  Complete  PCP or Specialist Appt within 3-5 Days Complete   Home Care Screening  Complete  Medication Review (RN CM)  Complete  HRI or Home Care Consult Complete   Palliative Care Screening Not Applicable   Medication Review (RN Care Manager) Complete

## 2023-05-24 NOTE — Plan of Care (Signed)
  Problem: Clinical Measurements: Goal: Cardiovascular complication will be avoided Outcome: Progressing   Problem: Safety: Goal: Ability to remain free from injury will improve Outcome: Progressing   

## 2023-05-25 ENCOUNTER — Encounter (HOSPITAL_COMMUNITY): Payer: Self-pay | Admitting: Cardiology

## 2023-05-25 DIAGNOSIS — I4891 Unspecified atrial fibrillation: Secondary | ICD-10-CM | POA: Diagnosis not present

## 2023-05-25 LAB — CBC
HCT: 31 % — ABNORMAL LOW (ref 39.0–52.0)
Hemoglobin: 10.8 g/dL — ABNORMAL LOW (ref 13.0–17.0)
MCH: 34.6 pg — ABNORMAL HIGH (ref 26.0–34.0)
MCHC: 34.8 g/dL (ref 30.0–36.0)
MCV: 99.4 fL (ref 80.0–100.0)
Platelets: 57 10*3/uL — ABNORMAL LOW (ref 150–400)
RBC: 3.12 MIL/uL — ABNORMAL LOW (ref 4.22–5.81)
RDW: 17.1 % — ABNORMAL HIGH (ref 11.5–15.5)
WBC: 6 10*3/uL (ref 4.0–10.5)
nRBC: 0 % (ref 0.0–0.2)

## 2023-05-25 LAB — BASIC METABOLIC PANEL
Anion gap: 18 — ABNORMAL HIGH (ref 5–15)
BUN: 61 mg/dL — ABNORMAL HIGH (ref 8–23)
CO2: 17 mmol/L — ABNORMAL LOW (ref 22–32)
Calcium: 7.8 mg/dL — ABNORMAL LOW (ref 8.9–10.3)
Chloride: 88 mmol/L — ABNORMAL LOW (ref 98–111)
Creatinine, Ser: 4.34 mg/dL — ABNORMAL HIGH (ref 0.61–1.24)
GFR, Estimated: 13 mL/min — ABNORMAL LOW (ref >60)
Glucose, Bld: 190 mg/dL — ABNORMAL HIGH (ref 70–99)
Potassium: 4.5 mmol/L (ref 3.5–5.1)
Sodium: 123 mmol/L — ABNORMAL LOW (ref 135–145)

## 2023-05-25 LAB — GLUCOSE, CAPILLARY
Glucose-Capillary: 188 mg/dL — ABNORMAL HIGH (ref 70–99)
Glucose-Capillary: 189 mg/dL — ABNORMAL HIGH (ref 70–99)
Glucose-Capillary: 177 mg/dL — ABNORMAL HIGH (ref 70–99)
Glucose-Capillary: 248 mg/dL — ABNORMAL HIGH (ref 70–99)
Glucose-Capillary: 265 mg/dL — ABNORMAL HIGH (ref 70–99)
Glucose-Capillary: 225 mg/dL — ABNORMAL HIGH (ref 70–99)

## 2023-05-25 MED ORDER — PHENOL 1.4 % MT LIQD
1.0000 | OROMUCOSAL | Status: DC | PRN
  Administered 2023-05-26 – 2023-05-30 (×3): 1 via OROMUCOSAL
  Filled 2023-05-25: qty 177

## 2023-05-25 MED ORDER — CALCITRIOL 0.5 MCG PO CAPS
1.2500 ug | ORAL_CAPSULE | ORAL | Status: DC
  Administered 2023-05-25 – 2023-05-28 (×2): 1.25 ug via ORAL
  Filled 2023-05-25 (×3): qty 1

## 2023-05-25 NOTE — TOC Progression Note (Addendum)
Transition of Care [facility]) - Progression Note    Patient Details  Name: Robert Lucas MRN: [id number] Date of Birth: 1941-05-13  Transition of Care [facility]) CM/SW Contact  Michaela Corner, [place] Phone Number: 05/25/2023, 3:55 PM  Clinical Narrative:   CSW started [place]. Reference # - [id number]  Auth approved: 05/26/2023 -05/29/2023    Expected Discharge Plan: Skilled Nursing Facility Barriers to Discharge: Continued Medical Work up, English as a second language teacher, SNF Pending bed offer  Expected Discharge Plan and Services In-house Referral: NA Discharge Planning Services: CM Consult Post Acute Care Choice: NA Living arrangements for the past 2 months: Single Family Home                 DME Arranged: N/A DME Agency: NA       HH Arranged: PT, OT, Patient Refused HH           Social Determinants of Health (SDOH) Interventions SDOH Screenings   Food Insecurity: No Food Insecurity (05/19/2023)  Housing: Low Risk  (05/19/2023)  Transportation Needs: No Transportation Needs (05/19/2023)  Utilities: Not At Risk (05/19/2023)  Tobacco Use: Medium Risk (05/19/2023)    Readmission Risk Interventions    05/21/2023    4:06 PM 12/20/2022    1:38 PM  Readmission Risk Prevention Plan  Transportation Screening Complete Complete  PCP or Specialist Appt within 5-7 Days  Complete  PCP or Specialist Appt within 3-5 Days Complete   Home Care Screening  Complete  Medication Review (RN CM)  Complete  HRI or Home Care Consult Complete   Palliative Care Screening Not Applicable   Medication Review (RN Care Manager) Complete

## 2023-05-25 NOTE — Progress Notes (Signed)
[name] Progress Note   Subjective:   Pt seen in room, He reports he feels fine. Eating ice. Denies SOB, CP, dizziness, nausea.  Objective Vitals:   05/24/23 2117 05/25/23 0105 05/25/23 0500 05/25/23 0718  BP: 127/73 (!) 118/58 118/70 117/68  Pulse: 86  74 89  Resp:  20 19 (!) 22  Temp: 98.6 F (37 C) 98.6 F (37 C) 97.8 F (36.6 C) 97.8 F (36.6 C)  TempSrc:  Oral Oral Oral  SpO2: 94% 97% 96% 96%  Weight:      Height:       Physical Exam General: Alert male in NAD Heart: irregularly irregular, no murmurs, HD in 80's Lungs: Diminished in bases but no wheezing, rhonchi or rales Abdomen: Non-distended, +BS Extremities: Trace to 1+ edema b/l lower extremities Dialysis Access:  AVG  +t/b  Additional Objective Labs: Basic Metabolic Panel: Recent Labs  Lab 05/19/23 0848 05/20/23 0332 05/22/23 1343 05/23/23 0928 05/24/23 0319 05/25/23 0850  NA 132*   < > 123* 125* 126* 123*  K 5.1   < > 3.6 4.5 3.8 4.5  CL 95*   < > 90* 89* 91* 88*  CO2 22   < > 19* 21* 26 17*  GLUCOSE 224*   < > 335* 277* 154* 190*  BUN 39*   < > 53* 55* 34* 61*  CREATININE 3.25*   < > 3.69* 4.10* 3.05* 4.34*  CALCIUM 8.5*   < > 7.5* 8.1* 8.0* 7.8*  PHOS 6.5*  --  5.3*  --  4.4  --    < > = values in this interval not displayed.   Liver Function Tests: Recent Labs  Lab 05/18/23 1820 05/19/23 0848 05/21/23 0445 05/22/23 1343  AST 57* 51* 24  --   ALT 93* 78* 61*  --   ALKPHOS 197* 157* 161*  --   BILITOT 2.0* 1.9* 1.1  --   PROT 6.2* 5.4* 5.2*  --   ALBUMIN 3.0* 2.5* 2.3* 2.1*   No results for input(s): "LIPASE", "AMYLASE" in the last 168 hours. CBC: Recent Labs  Lab 05/21/23 0445 05/22/23 0501 05/23/23 0351 05/24/23 0319 05/25/23 0325  WBC 10.6* 7.2 6.8 6.1 6.0  HGB 11.9* 11.6* 11.5* 11.6* 10.8*  HCT 34.9* 34.1* 33.8* 33.1* 31.0*  MCV 100.0 100.9* 98.8 100.3* 99.4  PLT 104* 71* 61* 60* 57*   Blood Culture    Component Value Date/Time   SDES BLOOD RIGHT  HAND 12/18/2022 0716   SPECREQUEST  12/18/2022 0716    BOTTLES DRAWN AEROBIC ONLY Blood Culture results may not be optimal due to an inadequate volume of blood received in culture bottles   CULT  12/18/2022 0716    NO GROWTH 5 DAYS Performed at [facility], [address] [place], [place] [address]    REPTSTATUS 12/23/2022 FINAL 12/18/2022 0716    Cardiac Enzymes: No results for input(s): "CKTOTAL", "CKMB", "CKMBINDEX", "TROPONINI" in the last 168 hours. CBG: Recent Labs  Lab 05/24/23 1623 05/24/23 2005 05/25/23 0101 05/25/23 0455 05/25/23 0809  GLUCAP 299* 254* 188* 189* 177*   Iron Studies: No results for input(s): "IRON", "TIBC", "TRANSFERRIN", "FERRITIN" in the last 72 hours. @lablastinr3 @ Studies/Results: ECHO TEE  Result Date: 05/24/2023    TRANSESOPHOGEAL ECHO REPORT   Patient Name:   Robert Lucas Date of Exam: 05/24/2023 Medical Rec #:  [id number]       Height:       62.0 in Accession #:  [id number]      Weight:       166.7 lb Date of Birth:  Jun 23, 1941      BSA:          1.769 m Patient Age:    82 years        BP:           73/56 mmHg Patient Gender: M               HR:           93 bpm. Exam Location:  Inpatient Procedure: Transesophageal Echo, Cardiac Doppler and Color Doppler Indications:    Atrial fibrillation  History:        Patient has prior history of Echocardiogram examinations, most                 recent 05/22/2023. CHF, Previous Myocardial Infarction and CAD;                 Risk Factors:Hypertension and Diabetes. ESRD on HD.  Sonographer:    Milda Smart Referring Phys: [id number] [name] PROCEDURE: After discussion of the risks and benefits of a TEE, an informed consent was obtained from the patient. TEE procedure time was 7 minutes. The transesophogeal probe was passed without difficulty through the esophogus of the patient. Imaged were  obtained with the patient in a left lateral decubitus position. Sedation performed by different  physician. The patient was monitored while under deep sedation. Anesthestetic sedation was provided intravenously by Anesthesiology: 50mg  of Propofol, 30mg  of Lidocaine. Image quality was good. The patient's vital signs; including heart rate, blood pressure, and oxygen saturation; remained stable throughout the procedure. The patient developed no complications during the procedure. A successful direct current cardioversion was performed at 200/250 joules with 2 attempts.  IMPRESSIONS  1. Left ventricular ejection fraction, by estimation, is <20%. The left ventricle has severely decreased function. The left ventricle demonstrates global hypokinesis. The left ventricular internal cavity size was mildly dilated.  2. Right ventricular systolic function is moderately reduced. The right ventricular size is mildly enlarged. There is normal pulmonary artery systolic pressure.  3. Left atrial size was mildly dilated. No left atrial/left atrial appendage thrombus was detected.  4. The mitral valve is normal in structure. Mild mitral valve regurgitation. Severe mitral annular calcification.  5. Tricuspid valve regurgitation is severe.  6. The aortic valve is tricuspid. There is moderate calcification of the aortic valve. There is moderate thickening of the aortic valve. Aortic valve regurgitation is not visualized. FINDINGS  Left Ventricle: Left ventricular ejection fraction, by estimation, is <20%. The left ventricle has severely decreased function. The left ventricle demonstrates global hypokinesis. The left ventricular internal cavity size was mildly dilated. Right Ventricle: The right ventricular size is mildly enlarged. No increase in right ventricular wall thickness. Right ventricular systolic function is moderately reduced. There is normal pulmonary artery systolic pressure. The tricuspid regurgitant velocity is 2.79 m/s, and with an assumed right atrial pressure of 3 mmHg, the estimated right ventricular systolic  pressure is 34.1 mmHg. Left Atrium: Left atrial size was mildly dilated. No left atrial/left atrial appendage thrombus was detected. Right Atrium: Right atrial size was normal in size. Pericardium: There is no evidence of pericardial effusion. Mitral Valve: The mitral valve is normal in structure. Severe mitral annular calcification. Mild mitral valve regurgitation. Tricuspid Valve: The tricuspid valve is normal in structure. Tricuspid valve regurgitation is severe. Aortic Valve: The aortic valve is tricuspid. There is moderate  calcification of the aortic valve. There is moderate thickening of the aortic valve. Aortic valve regurgitation is not visualized. Pulmonic Valve: The pulmonic valve was normal in structure. Pulmonic valve regurgitation is not visualized. Aorta: The aortic root is normal in size and structure. IAS/Shunts: No atrial level shunt detected by color flow Doppler. Additional Comments: Spectral Doppler performed. TRICUSPID VALVE TR Peak grad:   31.1 mmHg TR Vmax:        279.00 cm/s Donato Schultz MD Electronically signed by Donato Schultz MD Signature Date/Time: 05/24/2023/4:05:56 PM    Final    EP STUDY  Result Date: 05/24/2023 See surgical note for result.  Medications:   (feeding supplement) PROSource Plus  30 mL Oral BID BM   allopurinol  100 mg Oral q AM   amiodarone  200 mg Oral Daily   apixaban  2.5 mg Oral BID   atorvastatin  40 mg Oral QHS   Chlorhexidine Gluconate Cloth  6 each Topical Q0600   gabapentin  300 mg Oral QHS   insulin aspart  0-6 Units Subcutaneous Q4H   insulin aspart  2 Units Subcutaneous TID WC   insulin NPH Human  10 Units Subcutaneous QAC breakfast   midodrine  10 mg Oral TID WC   sodium chloride flush  3 mL Intravenous Q12H   tamsulosin  0.4 mg Oral QHS    Dialysis Orders: [facility] MWF 400/auto 1.5 3K/2.5Ca EDW 68.1 4h -Heparin 2500 units IV three times per week  - Calcitriol 1.25 mcg PO  three times per week No ESA    Assessment/Plan: A fib with RVR  complicated by hypotension:  cardiology consulting - on amio now. TS/p TEE. No thrombus. CTA neg PE.     2. ESRD on HD MWF: MWF. Had serial HD 11/18-11/20 due to volume overload. Now back on MWF schedule.    3. HFrEF with evidence of volume overload. EF 30-35% Cards following. EF 20% by TEE. Volume status improved after serial HD.    4. HTN: dx HTN at baseline, now with hypotension in setting of a fib, hold meds. On midodrine 10 mg PO TID, Volume much improved after 3 days of HD. Continue to UF as tolerated. Continue to attempt to optimize volume with HD.    5. Anemia of CKD:  Hb 10.8, no ESA indicated at this time.    6. BMM: Calcium a bit low. Resuming his PO calcitriol. Phosphorus at goal   7. Nutrition: Very low albumin. Continue protein supplements  Rogers Blocker, PA-C 05/25/2023, 9:46 AM  [facility] Pager: [phone]

## 2023-05-25 NOTE — Plan of Care (Signed)
Care plan reviewed.

## 2023-05-25 NOTE — Progress Notes (Signed)
Occupational Therapy Treatment Patient Details Name: Robert Lucas MRN: [id number] DOB: 03-19-1941 Today's Date: 05/25/2023   History of present illness 82 y.o. male presents to [facility] 05/18/23 w/ hypotension and a-fib w/ RVR. Prior admission in June for sepsis due to CAP. PMHx: ESRD on HD MWF, DM2, systolic/diastolic HF EF 30-35%, HLD, anemia, CAD, HTN, hx of MI, gout   OT comments  With max education and encouragement from unit secretary, pt agreeable for OOB activities with OT. Pt reports BLE feeling weaker w/ quick fatigue during tasks. Overall, pt requires Min-Mod A for standing, CGA for mobility in room with RW. Pt able to complete ADLs standing at sink briefly but requiring increased assist for LB ADLs d/t balance deficits. Plan to educate on AE use for LB ADL in next session w/ pt agreeable.       If plan is discharge home, recommend the following:  A little help with walking and/or transfers;A little help with bathing/dressing/bathroom;Assistance with cooking/housework   Equipment Recommendations  None recommended by OT    Recommendations for Other Services      Precautions / Restrictions Precautions Precautions: Fall Precaution Comments: monitor BP, HR Restrictions Weight Bearing Restrictions: No       Mobility Bed Mobility Overal bed mobility: Needs Assistance Bed Mobility: Supine to Sit     Supine to sit: Min assist, HOB elevated, Used rails     General bed mobility comments: assist to lift trunk    Transfers Overall transfer level: Needs assistance Equipment used: Rolling walker (2 wheels) Transfers: Sit to/from Stand Sit to Stand: Mod assist, Min assist           General transfer comment: Mod A from bed height, Min A from chair pushing from armrests.cues to avoid pulling on RW with improved carryover     Balance Overall balance assessment: No apparent balance deficits (not formally assessed) Sitting-balance support: Feet supported, No upper  extremity supported Sitting balance-Leahy Scale: Good     Standing balance support: Bilateral upper extremity supported, During functional activity Standing balance-Leahy Scale: Poor                             ADL either performed or assessed with clinical judgement   ADL Overall ADL's : Needs assistance/impaired     Grooming: Contact guard assist;Standing;Oral care;Wash/dry face   Upper Body Bathing: Set up;Sitting   Lower Body Bathing: Moderate assistance;Sitting/lateral leans;Sit to/from stand Lower Body Bathing Details (indicate cue type and reason): assist for peri region and lower LE Upper Body Dressing : Set up;Sitting   Lower Body Dressing: Moderate assistance;Sitting/lateral leans;Sit to/from stand Lower Body Dressing Details (indicate cue type and reason): unable to assist with socks today. discussed trial of AE with pt agreeable             Functional mobility during ADLs: Contact guard assist;Rolling walker (2 wheels)      Extremity/Trunk Assessment Upper Extremity Assessment Upper Extremity Assessment: Overall WFL for tasks assessed;Right hand dominant   Lower Extremity Assessment Lower Extremity Assessment: Defer to PT evaluation        Vision   Vision Assessment?: No apparent visual deficits   Perception     Praxis      Cognition Arousal: Alert Behavior During Therapy: WFL for tasks assessed/performed Overall Cognitive Status: Within Functional Limits for tasks assessed  General Comments: does need encouragement to participate        Exercises      Shoulder Instructions       General Comments      Pertinent Vitals/ Pain       Pain Assessment Pain Assessment: No/denies pain  Home Living                                          Prior Functioning/Environment              Frequency  Min 1X/week        Progress Toward Goals  OT Goals(current  goals can now be found in the care plan section)  Progress towards OT goals: Progressing toward goals  Acute Rehab OT Goals Patient Stated Goal: go to rehab in [place] OT Goal Formulation: With patient Time For Goal Achievement: 06/03/23 Potential to Achieve Goals: Good ADL Goals Pt Will Perform Grooming: with modified independence;standing Pt Will Perform Lower Body Dressing: with modified independence;sit to/from stand Pt Will Transfer to Toilet: with modified independence;ambulating  Plan      Co-evaluation                 AM-PAC OT "6 Clicks" Daily Activity     Outcome Measure   Help from another person eating meals?: None Help from another person taking care of personal grooming?: A Little Help from another person toileting, which includes using toliet, bedpan, or urinal?: A Little Help from another person bathing (including washing, rinsing, drying)?: A Little Help from another person to put on and taking off regular upper body clothing?: A Little Help from another person to put on and taking off regular lower body clothing?: A Lot 6 Click Score: 18    End of Session Equipment Utilized During Treatment: Rolling walker (2 wheels);Gait belt  OT Visit Diagnosis: Unsteadiness on feet (R26.81);Other abnormalities of gait and mobility (R26.89);Muscle weakness (generalized) (M62.81)   Activity Tolerance Patient tolerated treatment well   Patient Left in chair;with call bell/phone within reach;with chair alarm set   Nurse Communication Mobility status        Time: 1324-4010 OT Time Calculation (min): 35 min  Charges: OT General Charges $OT Visit: 1 Visit OT Treatments $Self Care/Home Management : 23-37 mins  Bradd Canary, OTR/L Acute Rehab Services Office: [phone]   Lorre Munroe 05/25/2023, 10:04 AM

## 2023-05-25 NOTE — Progress Notes (Signed)
BP improved in SR. Pt in SR with frequent ectopy.  No further inpatient recommendations, continue oral amiodarone and plan for cardiology follow up. Continue anticoagulation as tolerated.   Parke Poisson, MD

## 2023-05-25 NOTE — Progress Notes (Signed)
PROGRESS NOTE    Robert Lucas  UKG:[id number] DOB: 30-Jan-1941 DOA: 05/18/2023 PCP: Joycelyn Rua, MD   Brief Narrative:  This 82 yrs old male with PMH significant for ESRD on HD MWF ,  DM2, Combined systolic/diastolic heart failure with LVEF 30-35%, HLD, anemia, CAD, HTN, hx. of MI, gout, Presented with hypotension, atrial fibrillation with RVR. He was started on IV amiodarone and  IV heparin,subsequently transitioned to eliqus.  He is scheduled for DCCV on Thursday.   Assessment & Plan:   Principal Problem:   Atrial fibrillation with RVR (HCC) Active Problems:   DM2 (diabetes mellitus, type 2) (HCC)   Essential hypertension   Hyperlipidemia   Coronary artery disease involving native coronary artery of native heart without angina pectoris   Chronic combined systolic and diastolic CHF (congestive heart failure) (HCC)   ESRD (end stage renal disease) (HCC)   Acute respiratory failure with hypoxia (HCC)   Hypotension  Atrial fibrillation with RVR: Initially started on IV Cardizem then transitioned to amiodarone gtt.due to hypotension. Cardiology on board, s/p TEE/ DCCV > NSR TSH > WNL. Echocardiogram reviewed.  LVEF 25 to 30%, regional wall motion abnormalities. Heart rate now well controlled. Continue amiodarone and anticoagulation.  Cardiology signed off  Acute on chronic combined CHF: Continue Fluid management with HD. Nephrology on board.  Continue midodrine10 mg 3 times daily.   Hypotension:  BP parameters are borderline low.  Continue Midodrine.    Diabetes Mellitus type 2: Uncontrolled with hyperglycemia  Continue with Novolin N 10 units daily, SSI and 2 units of Novolog added TIDAC.  HbA1c is 8.8. Carb modified diet.   ESRD on HD: Nephrology on board and appreciate recommendations.  Continue hemodialysis as per schedule.   Hyperlipidemia: Continue statin.    BPH: Continue with flomax.     Hypervolemic Hyponatremia: Likely from ESRD and fluid  overload.  Continue hemodialysis as per schedule.   Elevated liver enzymes Increased liver echotexture consistent with hepatic steatosis.  Improved. Bilirubin wnl.  Could be due to CHF. Continue to monitor liver enzymes   Mild leukocytosis: Resolved.   Elevated lactic acid  possibly from CHF. Repeat in am.    Obesity: Estimated body mass index is 30.6 kg/m as calculated from the following:   Height as of this encounter: 5\' 2"  (1.575 m).   Weight as of this encounter: 75.9 kg.  Thrombocytopenia: Monitor platelet count.  No signs of active bleeding.  DVT prophylaxis: Eliquis Code Status:Full code Family Communication: No family at bed side. Disposition Plan:    Status is: Inpatient Remains inpatient appropriate because: Admitted for A-fib with RVR and hypotension.  Cardiology is consulted,  started on amiodarone, Patient remains on Eliquis.   He remains on hemodialysis as per schedule. He underwent successful cardioversion.   Consultants:  Cardiology Nephrology  Procedures:  scheduled DCCV 11/21 Antimicrobials:  Anti-infectives (From admission, onward)    None       Subjective: Patient was seen and examined at bedside. Overnight events noted.   Patient reports doing better, His heart rate is now well-controlled. He is status post successful cardioversion to NSR.   Objective: Vitals:   05/25/23 0500 05/25/23 0718 05/25/23 1130 05/25/23 1131  BP: 118/70 117/68 (!) 111/52   Pulse: 74 89 85   Resp: 19 (!) 22 19   Temp: 97.8 F (36.6 C) 97.8 F (36.6 C) 97.7 F (36.5 C)   TempSrc: Oral Oral Oral   SpO2: 96% 96% 90% 90%  Weight:  Height:        Intake/Output Summary (Last 24 hours) at 05/25/2023 1236 Last data filed at 05/25/2023 0825 Gross per 24 hour  Intake 657 ml  Output --  Net 657 ml   Filed Weights   05/23/23 0952 05/23/23 1412 05/24/23 0324  Weight: 76.3 kg 74.1 kg 75.6 kg    Examination: General exam: Appears comfortable,  deconditioned, not in any acute distress. Respiratory system: Clear to auscultation. Respiratory effort normal.  RR 14 Cardiovascular system: S1 & S2 heard, regular rate and rhythm, no murmur.  No pedal edema. Gastrointestinal system: Abdomen is non distended, soft and non tender. Normal bowel sounds heard. Central nervous system: Alert and oriented x 3. No focal neurological deficits. Extremities: No edema, No cyanosis, No clubbing Skin: No rashes, lesions or ulcers Psychiatry: Judgement and insight appear normal. Mood & affect appropriate.     Data Reviewed: I have personally reviewed following labs and imaging studies  CBC: Recent Labs  Lab 05/21/23 0445 05/22/23 0501 05/23/23 0351 05/24/23 0319 05/25/23 0325  WBC 10.6* 7.2 6.8 6.1 6.0  HGB 11.9* 11.6* 11.5* 11.6* 10.8*  HCT 34.9* 34.1* 33.8* 33.1* 31.0*  MCV 100.0 100.9* 98.8 100.3* 99.4  PLT 104* 71* 61* 60* 57*   Basic Metabolic Panel: Recent Labs  Lab 05/19/23 0024 05/19/23 0848 05/20/23 0332 05/21/23 0433 05/22/23 1343 05/23/23 0928 05/24/23 0319 05/25/23 0850  NA  --  132*   < > 125* 123* 125* 126* 123*  K  --  5.1   < > 4.7 3.6 4.5 3.8 4.5  CL  --  95*   < > 90* 90* 89* 91* 88*  CO2  --  22   < > 18* 19* 21* 26 17*  GLUCOSE  --  224*   < > 267* 335* 277* 154* 190*  BUN  --  39*   < > 69* 53* 55* 34* 61*  CREATININE  --  3.25*   < > 5.36* 3.69* 4.10* 3.05* 4.34*  CALCIUM  --  8.5*   < > 8.7* 7.5* 8.1* 8.0* 7.8*  MG 2.0 2.1  --   --   --   --  1.7  --   PHOS 6.2* 6.5*  --   --  5.3*  --  4.4  --    < > = values in this interval not displayed.   GFR: Estimated Creatinine Clearance: 11.3 mL/min (A) (by C-G formula based on SCr of 4.34 mg/dL (H)). Liver Function Tests: Recent Labs  Lab 05/18/23 1820 05/19/23 0848 05/21/23 0445 05/22/23 1343  AST 57* 51* 24  --   ALT 93* 78* 61*  --   ALKPHOS 197* 157* 161*  --   BILITOT 2.0* 1.9* 1.1  --   PROT 6.2* 5.4* 5.2*  --   ALBUMIN 3.0* 2.5* 2.3* 2.1*    No results for input(s): "LIPASE", "AMYLASE" in the last 168 hours. No results for input(s): "AMMONIA" in the last 168 hours. Coagulation Profile: Recent Labs  Lab 05/19/23 0024  INR 1.2   Cardiac Enzymes: No results for input(s): "CKTOTAL", "CKMB", "CKMBINDEX", "TROPONINI" in the last 168 hours. BNP (last 3 results) No results for input(s): "PROBNP" in the last 8760 hours. HbA1C: No results for input(s): "HGBA1C" in the last 72 hours. CBG: Recent Labs  Lab 05/24/23 2005 05/25/23 0101 05/25/23 0455 05/25/23 0809 05/25/23 1108  GLUCAP 254* 188* 189* 177* 248*   Lipid Profile: No results for input(s): "CHOL", "HDL", "LDLCALC", "TRIG", "CHOLHDL", "  LDLDIRECT" in the last 72 hours. Thyroid Function Tests: No results for input(s): "TSH", "T4TOTAL", "FREET4", "T3FREE", "THYROIDAB" in the last 72 hours. Anemia Panel: No results for input(s): "VITAMINB12", "FOLATE", "FERRITIN", "TIBC", "IRON", "RETICCTPCT" in the last 72 hours. Sepsis Labs: Recent Labs  Lab 05/19/23 0024 05/19/23 0421 05/19/23 1417 05/21/23 0445  LATICACIDVEN 2.8* 2.8* 4.0* 2.3*    Recent Results (from the past 240 hour(s))  MRSA Next Gen by PCR, Nasal     Status: None   Collection Time: 05/22/23  4:40 AM   Specimen: Nasal Mucosa; Nasal Swab  Result Value Ref Range Status   MRSA by PCR Next Gen NOT DETECTED NOT DETECTED Final    Comment: (NOTE) The GeneXpert MRSA Assay (FDA approved for NASAL specimens only), is one component of a comprehensive MRSA colonization surveillance program. It is not intended to diagnose MRSA infection nor to guide or monitor treatment for MRSA infections. Test performance is not FDA approved in patients less than 41 years old. Performed at [facility], [address], [place], [place] [address]     Radiology Studies: ECHO TEE  Result Date: 05/24/2023    TRANSESOPHOGEAL ECHO REPORT   Patient Name:   Robert Lucas Date of Exam: 05/24/2023 Medical Rec #:   [id number]       Height:       62.0 in Accession #:    [id number]      Weight:       166.7 lb Date of Birth:  1940/12/25      BSA:          1.769 m Patient Age:    82 years        BP:           73/56 mmHg Patient Gender: M               HR:           93 bpm. Exam Location:  Inpatient Procedure: Transesophageal Echo, Cardiac Doppler and Color Doppler Indications:    Atrial fibrillation  History:        Patient has prior history of Echocardiogram examinations, most                 recent 05/22/2023. CHF, Previous Myocardial Infarction and CAD;                 Risk Factors:Hypertension and Diabetes. ESRD on HD.  Sonographer:    Milda Smart Referring Phys: [id number] [name] PROCEDURE: After discussion of the risks and benefits of a TEE, an informed consent was obtained from the patient. TEE procedure time was 7 minutes. The transesophogeal probe was passed without difficulty through the esophogus of the patient. Imaged were  obtained with the patient in a left lateral decubitus position. Sedation performed by different physician. The patient was monitored while under deep sedation. Anesthestetic sedation was provided intravenously by Anesthesiology: 50mg  of Propofol, 30mg  of Lidocaine. Image quality was good. The patient's vital signs; including heart rate, blood pressure, and oxygen saturation; remained stable throughout the procedure. The patient developed no complications during the procedure. A successful direct current cardioversion was performed at 200/250 joules with 2 attempts.  IMPRESSIONS  1. Left ventricular ejection fraction, by estimation, is <20%. The left ventricle has severely decreased function. The left ventricle demonstrates global hypokinesis. The left ventricular internal cavity size was mildly dilated.  2. Right ventricular systolic function is moderately reduced. The right ventricular size is mildly enlarged. There  is normal pulmonary artery systolic pressure.  3. Left atrial size was  mildly dilated. No left atrial/left atrial appendage thrombus was detected.  4. The mitral valve is normal in structure. Mild mitral valve regurgitation. Severe mitral annular calcification.  5. Tricuspid valve regurgitation is severe.  6. The aortic valve is tricuspid. There is moderate calcification of the aortic valve. There is moderate thickening of the aortic valve. Aortic valve regurgitation is not visualized. FINDINGS  Left Ventricle: Left ventricular ejection fraction, by estimation, is <20%. The left ventricle has severely decreased function. The left ventricle demonstrates global hypokinesis. The left ventricular internal cavity size was mildly dilated. Right Ventricle: The right ventricular size is mildly enlarged. No increase in right ventricular wall thickness. Right ventricular systolic function is moderately reduced. There is normal pulmonary artery systolic pressure. The tricuspid regurgitant velocity is 2.79 m/s, and with an assumed right atrial pressure of 3 mmHg, the estimated right ventricular systolic pressure is 34.1 mmHg. Left Atrium: Left atrial size was mildly dilated. No left atrial/left atrial appendage thrombus was detected. Right Atrium: Right atrial size was normal in size. Pericardium: There is no evidence of pericardial effusion. Mitral Valve: The mitral valve is normal in structure. Severe mitral annular calcification. Mild mitral valve regurgitation. Tricuspid Valve: The tricuspid valve is normal in structure. Tricuspid valve regurgitation is severe. Aortic Valve: The aortic valve is tricuspid. There is moderate calcification of the aortic valve. There is moderate thickening of the aortic valve. Aortic valve regurgitation is not visualized. Pulmonic Valve: The pulmonic valve was normal in structure. Pulmonic valve regurgitation is not visualized. Aorta: The aortic root is normal in size and structure. IAS/Shunts: No atrial level shunt detected by color flow Doppler. Additional  Comments: Spectral Doppler performed. TRICUSPID VALVE TR Peak grad:   31.1 mmHg TR Vmax:        279.00 cm/s Donato Schultz MD Electronically signed by Donato Schultz MD Signature Date/Time: 05/24/2023/4:05:56 PM    Final    EP STUDY  Result Date: 05/24/2023 See surgical note for result.   Scheduled Meds:  (feeding supplement) PROSource Plus  30 mL Oral BID BM   allopurinol  100 mg Oral q AM   amiodarone  200 mg Oral Daily   apixaban  2.5 mg Oral BID   atorvastatin  40 mg Oral QHS   calcitRIOL  1.25 mcg Oral Q M,W,F-HD   Chlorhexidine Gluconate Cloth  6 each Topical Q0600   gabapentin  300 mg Oral QHS   insulin aspart  0-6 Units Subcutaneous Q4H   insulin aspart  2 Units Subcutaneous TID WC   insulin NPH Human  10 Units Subcutaneous QAC breakfast   midodrine  10 mg Oral TID WC   sodium chloride flush  3 mL Intravenous Q12H   tamsulosin  0.4 mg Oral QHS   Continuous Infusions:     LOS: 6 days    Time spent: 35 mins    Willeen Niece, MD [facility]   If 7PM-7AM, please contact night-coverage

## 2023-05-25 NOTE — Progress Notes (Signed)
OT Cancellation Note  Patient Details Name: CHAMBERLAIN MARKSON MRN: [id number] DOB: 05/10/1941   Cancelled Treatment:    Reason Eval/Treat Not Completed: Patient declined, no reason specified Pt reported planned HD today (though unsure what time), declined to work with OT prior to HD.   Lorre Munroe 05/25/2023, 8:43 AM

## 2023-05-25 NOTE — Progress Notes (Addendum)
Contacted by CSW with request to contact Ricky at [facility] in [place]. Called [facility] and left message requesting a return call. Will await return call. Will assist as needed.   Olivia Canter Renal Navigator [phone]  Addendum at 2:31 pm: Case discussed with Clide Cliff at [place]. Referral submitted to [facility] admissions this afternoon with request for [facility] snf den for HD at d/c. Will await approval/acceptance from [facility] and [facility].

## 2023-05-26 DIAGNOSIS — I4891 Unspecified atrial fibrillation: Secondary | ICD-10-CM | POA: Diagnosis not present

## 2023-05-26 LAB — GLUCOSE, CAPILLARY
Glucose-Capillary: 139 mg/dL — ABNORMAL HIGH (ref 70–99)
Glucose-Capillary: 147 mg/dL — ABNORMAL HIGH (ref 70–99)
Glucose-Capillary: 153 mg/dL — ABNORMAL HIGH (ref 70–99)
Glucose-Capillary: 174 mg/dL — ABNORMAL HIGH (ref 70–99)
Glucose-Capillary: 179 mg/dL — ABNORMAL HIGH (ref 70–99)
Glucose-Capillary: 238 mg/dL — ABNORMAL HIGH (ref 70–99)

## 2023-05-26 LAB — CBC
HCT: 31.5 % — ABNORMAL LOW (ref 39.0–52.0)
HCT: 31.4 % — ABNORMAL LOW (ref 39.0–52.0)
Hemoglobin: 11.1 g/dL — ABNORMAL LOW (ref 13.0–17.0)
Hemoglobin: 10.9 g/dL — ABNORMAL LOW (ref 13.0–17.0)
MCH: 34.4 pg — ABNORMAL HIGH (ref 26.0–34.0)
MCH: 33.6 pg (ref 26.0–34.0)
MCHC: 35.2 g/dL (ref 30.0–36.0)
MCHC: 34.7 g/dL (ref 30.0–36.0)
MCV: 97.5 fL (ref 80.0–100.0)
MCV: 96.9 fL (ref 80.0–100.0)
Platelets: 59 10*3/uL — ABNORMAL LOW (ref 150–400)
Platelets: 61 10*3/uL — ABNORMAL LOW (ref 150–400)
RBC: 3.23 MIL/uL — ABNORMAL LOW (ref 4.22–5.81)
RBC: 3.24 MIL/uL — ABNORMAL LOW (ref 4.22–5.81)
RDW: 16.9 % — ABNORMAL HIGH (ref 11.5–15.5)
RDW: 16.9 % — ABNORMAL HIGH (ref 11.5–15.5)
WBC: 5.1 10*3/uL (ref 4.0–10.5)
WBC: 4.7 10*3/uL (ref 4.0–10.5)
nRBC: 0 % (ref 0.0–0.2)
nRBC: 0 % (ref 0.0–0.2)

## 2023-05-26 LAB — BASIC METABOLIC PANEL
Anion gap: 12 (ref 5–15)
BUN: 72 mg/dL — ABNORMAL HIGH (ref 8–23)
CO2: 23 mmol/L (ref 22–32)
Calcium: 7.8 mg/dL — ABNORMAL LOW (ref 8.9–10.3)
Chloride: 87 mmol/L — ABNORMAL LOW (ref 98–111)
Creatinine, Ser: 4.74 mg/dL — ABNORMAL HIGH (ref 0.61–1.24)
GFR, Estimated: 11 mL/min — ABNORMAL LOW (ref >60)
Glucose, Bld: 185 mg/dL — ABNORMAL HIGH (ref 70–99)
Potassium: 3.8 mmol/L (ref 3.5–5.1)
Sodium: 122 mmol/L — ABNORMAL LOW (ref 135–145)

## 2023-05-26 LAB — PHOSPHORUS: Phosphorus: 5.7 mg/dL — ABNORMAL HIGH (ref 2.5–4.6)

## 2023-05-26 LAB — MAGNESIUM: Magnesium: 1.8 mg/dL (ref 1.7–2.4)

## 2023-05-26 MED ORDER — HEPARIN SODIUM (PORCINE) 1000 UNIT/ML DIALYSIS
1000.0000 [IU] | INTRAMUSCULAR | Status: DC | PRN
Start: 2023-05-26 — End: 2023-05-28

## 2023-05-26 MED ORDER — LIDOCAINE-PRILOCAINE 2.5-2.5 % EX CREA: 1.0000 | TOPICAL_CREAM | CUTANEOUS | Status: DC | PRN

## 2023-05-26 MED ORDER — ANTICOAGULANT SODIUM CITRATE 4% (200MG/5ML) IV SOLN
5.0000 mL | Status: DC | PRN
Start: 2023-05-26 — End: 2023-05-28

## 2023-05-26 MED ORDER — ALTEPLASE 2 MG IJ SOLR: 2.0000 mg | Freq: Once | INTRAMUSCULAR | Status: DC | PRN

## 2023-05-26 MED ORDER — LIDOCAINE HCL (PF) 1 % IJ SOLN: 5.0000 mL | INTRAMUSCULAR | Status: DC | PRN

## 2023-05-26 MED ORDER — PENTAFLUOROPROP-TETRAFLUOROETH EX AERO: 1.0000 | INHALATION_SPRAY | CUTANEOUS | Status: DC | PRN

## 2023-05-26 MED ORDER — CHLORHEXIDINE GLUCONATE CLOTH 2 % EX PADS
6.0000 | MEDICATED_PAD | Freq: Every day | CUTANEOUS | Status: DC
  Administered 2023-05-26 – 2023-05-30 (×5): 6 via TOPICAL

## 2023-05-26 MED ORDER — INSULIN ASPART 100 UNIT/ML IJ SOLN
0.0000 [IU] | Freq: Three times a day (TID) | INTRAMUSCULAR | Status: DC
  Administered 2023-05-26: 1 [IU] via SUBCUTANEOUS
  Administered 2023-05-27: 4 [IU] via SUBCUTANEOUS
  Administered 2023-05-27: 3 [IU] via SUBCUTANEOUS
  Administered 2023-05-28: 1 [IU] via SUBCUTANEOUS
  Administered 2023-05-28 – 2023-05-29 (×4): 2 [IU] via SUBCUTANEOUS
  Administered 2023-05-30: 1 [IU] via SUBCUTANEOUS

## 2023-05-26 MED ORDER — CHLORHEXIDINE GLUCONATE CLOTH 2 % EX PADS: 6.0000 | MEDICATED_PAD | Freq: Every day | CUTANEOUS | Status: DC

## 2023-05-26 MED ORDER — PENTAFLUOROPROP-TETRAFLUOROETH EX AERO
1.0000 | INHALATION_SPRAY | CUTANEOUS | Status: DC | PRN
Start: 2023-05-26 — End: 2023-05-28

## 2023-05-26 MED ORDER — HEPARIN SODIUM (PORCINE) 1000 UNIT/ML DIALYSIS: 2500.0000 [IU] | Freq: Once | INTRAMUSCULAR | Status: DC

## 2023-05-26 MED ORDER — HEPARIN SODIUM (PORCINE) 1000 UNIT/ML DIALYSIS
1000.0000 [IU] | INTRAMUSCULAR | Status: DC | PRN
Start: 2023-05-26 — End: 2023-05-26

## 2023-05-26 MED ORDER — HEPARIN SODIUM (PORCINE) 1000 UNIT/ML DIALYSIS
2500.0000 [IU] | Freq: Once | INTRAMUSCULAR | Status: DC
  Filled 2023-05-26: qty 3

## 2023-05-26 MED ORDER — LIDOCAINE HCL (PF) 1 % IJ SOLN
5.0000 mL | INTRAMUSCULAR | Status: DC | PRN
Start: 2023-05-26 — End: 2023-05-28

## 2023-05-26 MED ORDER — LIDOCAINE-PRILOCAINE 2.5-2.5 % EX CREA
1.0000 | TOPICAL_CREAM | CUTANEOUS | Status: DC | PRN
Start: 2023-05-26 — End: 2023-05-28

## 2023-05-26 NOTE — Progress Notes (Signed)
PROGRESS NOTE    Robert Lucas  ZOX:[id number] DOB: 05-06-1941 DOA: 05/18/2023 PCP: Joycelyn Rua, MD   Brief Narrative:  This 82 yrs old male with PMH significant for ESRD on HD MWF ,  DM2, Combined systolic/diastolic heart failure with LVEF 30-35%, HLD, anemia, CAD, HTN, hx. of MI, gout, Presented with hypotension, atrial fibrillation with RVR. He was started on IV amiodarone and  IV heparin, subsequently transitioned to eliqus.  He underwent successful  DCCV to SR.   Assessment & Plan:   Principal Problem:   Atrial fibrillation with RVR (HCC) Active Problems:   DM2 (diabetes mellitus, type 2) (HCC)   Essential hypertension   Hyperlipidemia   Coronary artery disease involving native coronary artery of native heart without angina pectoris   Chronic combined systolic and diastolic CHF (congestive heart failure) (HCC)   ESRD (end stage renal disease) (HCC)   Acute respiratory failure with hypoxia (HCC)   Hypotension  Atrial fibrillation with RVR: Initially started on IV Cardizem then transitioned to amiodarone gtt.due to hypotension. Cardiology on board, s/p TEE/ DCCV > NSR TSH > WNL. Echocardiogram reviewed.  LVEF 25 to 30%, regional wall motion abnormalities. Heart rate now well controlled. Continue amiodarone and anticoagulation.  Cardiology signed off  Acute on chronic combined CHF: Continue Fluid management with HD. Nephrology on board.  Continue midodrine10 mg 3 times daily.   Hypotension:  BP parameters are borderline low.  Continue Midodrine.    Diabetes Mellitus type 2: Uncontrolled with hyperglycemia  Continue with Novolin N 10 units daily, SSI and 2 units of Novolog added TIDAC.  HbA1c is 8.8. Carb modified diet.   ESRD on HD: Nephrology on board and appreciate recommendations.  Continue hemodialysis as per schedule.   Hyperlipidemia: Continue statin.    BPH: Continue with flomax.     Hypervolemic Hyponatremia: Likely from ESRD and fluid  overload.  Continue hemodialysis as per schedule.   Elevated liver enzymes Increased liver echotexture consistent with hepatic steatosis.  Improved. Bilirubin wnl.  Could be due to CHF. Continue to monitor liver enzymes   Mild leukocytosis: Resolved.   Elevated lactic acid  possibly from CHF. Repeat in am.    Obesity: Estimated body mass index is 30.6 kg/m as calculated from the following:   Height as of this encounter: 5\' 2"  (1.575 m).   Weight as of this encounter: 75.9 kg.  Thrombocytopenia: Monitor platelet count.  No signs of active bleeding.  DVT prophylaxis: Eliquis Code Status:Full code Family Communication: No family at bed side. Disposition Plan:    Status is: Inpatient Remains inpatient appropriate because: Admitted for A-fib with RVR and hypotension.  Cardiology is consulted,  started on amiodarone, Patient remains on Eliquis.   He remains on hemodialysis as per schedule. He underwent successful cardioversion.  PT and OT recommended SNF.  Awaiting insurance authorization.   Consultants:  Cardiology Nephrology  Procedures:  scheduled DCCV 11/21 Antimicrobials:  Anti-infectives (From admission, onward)    None       Subjective: Patient was seen and examined at bedside. Overnight events noted.   Patient reports doing much better.  His heart rate is well-controlled. He is status post successful cardioversion to NSR. He is going to have hemodialysis today off scheduled.   Objective: Vitals:   05/25/23 2200 05/26/23 0032 05/26/23 0414 05/26/23 0740  BP:  (!) 120/59 110/63 (!) 109/58  Pulse:  86 82   Resp: 19 19 15    Temp:  98.3 F (36.8 C) 98.4 F (36.9  C) 98.3 F (36.8 C)  TempSrc:  Oral Oral Oral  SpO2:  99% 92%   Weight:   82.4 kg   Height:        Intake/Output Summary (Last 24 hours) at 05/26/2023 1026 Last data filed at 05/25/2023 1500 Gross per 24 hour  Intake 417 ml  Output --  Net 417 ml   Filed Weights   05/23/23 1412 05/24/23  0324 05/26/23 0414  Weight: 74.1 kg 75.6 kg 82.4 kg    Examination: General exam: Appears comfortable, deconditioned, not in any acute distress. Respiratory system: CTA bilaterally.  Respiratory effort normal.  RR 13 Cardiovascular system: S1 & S2 heard, regular rate and rhythm, no murmur.  No pedal edema. Gastrointestinal system: Abdomen is non distended, soft and non tender. Normal bowel sounds heard. Central nervous system: Alert and oriented x 3. No focal neurological deficits. Extremities: No edema, No cyanosis, No clubbing Skin: No rashes, lesions or ulcers Psychiatry: Judgement and insight appear normal. Mood & affect appropriate.     Data Reviewed: I have personally reviewed following labs and imaging studies  CBC: Recent Labs  Lab 05/22/23 0501 05/23/23 0351 05/24/23 0319 05/25/23 0325 05/26/23 0228  WBC 7.2 6.8 6.1 6.0 5.1  HGB 11.6* 11.5* 11.6* 10.8* 11.1*  HCT 34.1* 33.8* 33.1* 31.0* 31.5*  MCV 100.9* 98.8 100.3* 99.4 97.5  PLT 71* 61* 60* 57* 59*   Basic Metabolic Panel: Recent Labs  Lab 05/22/23 1343 05/23/23 0928 05/24/23 0319 05/25/23 0850 05/26/23 0228  NA 123* 125* 126* 123* 122*  K 3.6 4.5 3.8 4.5 3.8  CL 90* 89* 91* 88* 87*  CO2 19* 21* 26 17* 23  GLUCOSE 335* 277* 154* 190* 185*  BUN 53* 55* 34* 61* 72*  CREATININE 3.69* 4.10* 3.05* 4.34* 4.74*  CALCIUM 7.5* 8.1* 8.0* 7.8* 7.8*  MG  --   --  1.7  --  1.8  PHOS 5.3*  --  4.4  --  5.7*   GFR: Estimated Creatinine Clearance: 10.8 mL/min (A) (by C-G formula based on SCr of 4.74 mg/dL (H)). Liver Function Tests: Recent Labs  Lab 05/21/23 0445 05/22/23 1343  AST 24  --   ALT 61*  --   ALKPHOS 161*  --   BILITOT 1.1  --   PROT 5.2*  --   ALBUMIN 2.3* 2.1*   No results for input(s): "LIPASE", "AMYLASE" in the last 168 hours. No results for input(s): "AMMONIA" in the last 168 hours. Coagulation Profile: No results for input(s): "INR", "PROTIME" in the last 168 hours.  Cardiac  Enzymes: No results for input(s): "CKTOTAL", "CKMB", "CKMBINDEX", "TROPONINI" in the last 168 hours. BNP (last 3 results) No results for input(s): "PROBNP" in the last 8760 hours. HbA1C: No results for input(s): "HGBA1C" in the last 72 hours. CBG: Recent Labs  Lab 05/25/23 2047 05/26/23 0041 05/26/23 0414 05/26/23 0647 05/26/23 0738  GLUCAP 225* 238* 174* 147* 179*   Lipid Profile: No results for input(s): "CHOL", "HDL", "LDLCALC", "TRIG", "CHOLHDL", "LDLDIRECT" in the last 72 hours. Thyroid Function Tests: No results for input(s): "TSH", "T4TOTAL", "FREET4", "T3FREE", "THYROIDAB" in the last 72 hours. Anemia Panel: No results for input(s): "VITAMINB12", "FOLATE", "FERRITIN", "TIBC", "IRON", "RETICCTPCT" in the last 72 hours. Sepsis Labs: Recent Labs  Lab 05/19/23 1417 05/21/23 0445  LATICACIDVEN 4.0* 2.3*    Recent Results (from the past 240 hour(s))  MRSA Next Gen by PCR, Nasal     Status: None   Collection Time: 05/22/23  4:40 AM  Specimen: Nasal Mucosa; Nasal Swab  Result Value Ref Range Status   MRSA by PCR Next Gen NOT DETECTED NOT DETECTED Final    Comment: (NOTE) The GeneXpert MRSA Assay (FDA approved for NASAL specimens only), is one component of a comprehensive MRSA colonization surveillance program. It is not intended to diagnose MRSA infection nor to guide or monitor treatment for MRSA infections. Test performance is not FDA approved in patients less than 41 years old. Performed at [facility], [address], [place], [place] [address]     Radiology Studies: No results found.  Scheduled Meds:  (feeding supplement) PROSource Plus  30 mL Oral BID BM   allopurinol  100 mg Oral q AM   amiodarone  200 mg Oral Daily   apixaban  2.5 mg Oral BID   atorvastatin  40 mg Oral QHS   calcitRIOL  1.25 mcg Oral Q M,W,F-HD   Chlorhexidine Gluconate Cloth  6 each Topical Q0600   gabapentin  300 mg Oral QHS   insulin aspart  0-6 Units Subcutaneous TID AC &  HS   insulin aspart  2 Units Subcutaneous TID WC   insulin NPH Human  10 Units Subcutaneous QAC breakfast   midodrine  10 mg Oral TID WC   sodium chloride flush  3 mL Intravenous Q12H   tamsulosin  0.4 mg Oral QHS   Continuous Infusions:     LOS: 7 days    Time spent: 35 mins    Willeen Niece, MD [facility]   If 7PM-7AM, please contact night-coverage

## 2023-05-26 NOTE — Plan of Care (Signed)
Problem: Coping: Goal: Ability to adjust to condition or change in health will improve Outcome: Progressing

## 2023-05-26 NOTE — Progress Notes (Signed)
[name] Progress Note   Subjective:   Pt denies SOB, CP, dizziness, nausea. Reports he still has some swelling in his legs but he feels like it is improving.   Objective Vitals:   05/25/23 2200 05/26/23 0032 05/26/23 0414 05/26/23 0740  BP:  (!) 120/59 110/63 (!) 109/58  Pulse:  86 82   Resp: 19 19 15    Temp:  98.3 F (36.8 C) 98.4 F (36.9 C) 98.3 F (36.8 C)  TempSrc:  Oral Oral Oral  SpO2:  99% 92%   Weight:   82.4 kg   Height:       Physical Exam General: Alert male in NAD Heart: RRR, no murmurs, rubs or gallops Lungs: CTA bilaterally, respirations unlabored on RA Abdomen: Soft, non-distended, +BS Extremities: 2+ edema b/l lower extremities Dialysis Access: AVG +t/b  Additional Objective Labs: Basic Metabolic Panel: Recent Labs  Lab 05/22/23 1343 05/23/23 0928 05/24/23 0319 05/25/23 0850 05/26/23 0228  NA 123*   < > 126* 123* 122*  K 3.6   < > 3.8 4.5 3.8  CL 90*   < > 91* 88* 87*  CO2 19*   < > 26 17* 23  GLUCOSE 335*   < > 154* 190* 185*  BUN 53*   < > 34* 61* 72*  CREATININE 3.69*   < > 3.05* 4.34* 4.74*  CALCIUM 7.5*   < > 8.0* 7.8* 7.8*  PHOS 5.3*  --  4.4  --  5.7*   < > = values in this interval not displayed.   Liver Function Tests: Recent Labs  Lab 05/19/23 0848 05/21/23 0445 05/22/23 1343  AST 51* 24  --   ALT 78* 61*  --   ALKPHOS 157* 161*  --   BILITOT 1.9* 1.1  --   PROT 5.4* 5.2*  --   ALBUMIN 2.5* 2.3* 2.1*   No results for input(s): "LIPASE", "AMYLASE" in the last 168 hours. CBC: Recent Labs  Lab 05/22/23 0501 05/23/23 0351 05/24/23 0319 05/25/23 0325 05/26/23 0228  WBC 7.2 6.8 6.1 6.0 5.1  HGB 11.6* 11.5* 11.6* 10.8* 11.1*  HCT 34.1* 33.8* 33.1* 31.0* 31.5*  MCV 100.9* 98.8 100.3* 99.4 97.5  PLT 71* 61* 60* 57* 59*   Blood Culture    Component Value Date/Time   SDES BLOOD RIGHT HAND 12/18/2022 0716   SPECREQUEST  12/18/2022 0716    BOTTLES DRAWN AEROBIC ONLY Blood Culture results may not be  optimal due to an inadequate volume of blood received in culture bottles   CULT  12/18/2022 0716    NO GROWTH 5 DAYS Performed at [facility], [address], [place], [place] [address]    REPTSTATUS 12/23/2022 FINAL 12/18/2022 0716    Cardiac Enzymes: No results for input(s): "CKTOTAL", "CKMB", "CKMBINDEX", "TROPONINI" in the last 168 hours. CBG: Recent Labs  Lab 05/25/23 2047 05/26/23 0041 05/26/23 0414 05/26/23 0647 05/26/23 0738  GLUCAP 225* 238* 174* 147* 179*   Iron Studies: No results for input(s): "IRON", "TIBC", "TRANSFERRIN", "FERRITIN" in the last 72 hours. @lablastinr3 @ Studies/Results: ECHO TEE  Result Date: 05/24/2023    TRANSESOPHOGEAL ECHO REPORT   Patient Name:   Robert Lucas Date of Exam: 05/24/2023 Medical Rec #:  [id number]       Height:       62.0 in Accession #:    [id number]      Weight:       166.7 lb Date of Birth:  03-15-1941  BSA:          1.769 m Patient Age:    82 years        BP:           73/56 mmHg Patient Gender: M               HR:           93 bpm. Exam Location:  Inpatient Procedure: Transesophageal Echo, Cardiac Doppler and Color Doppler Indications:    Atrial fibrillation  History:        Patient has prior history of Echocardiogram examinations, most                 recent 05/22/2023. CHF, Previous Myocardial Infarction and CAD;                 Risk Factors:Hypertension and Diabetes. ESRD on HD.  Sonographer:    Milda Smart Referring Phys: [id number] [name] PROCEDURE: After discussion of the risks and benefits of a TEE, an informed consent was obtained from the patient. TEE procedure time was 7 minutes. The transesophogeal probe was passed without difficulty through the esophogus of the patient. Imaged were  obtained with the patient in a left lateral decubitus position. Sedation performed by different physician. The patient was monitored while under deep sedation. Anesthestetic sedation was provided intravenously by  Anesthesiology: 50mg  of Propofol, 30mg  of Lidocaine. Image quality was good. The patient's vital signs; including heart rate, blood pressure, and oxygen saturation; remained stable throughout the procedure. The patient developed no complications during the procedure. A successful direct current cardioversion was performed at 200/250 joules with 2 attempts.  IMPRESSIONS  1. Left ventricular ejection fraction, by estimation, is <20%. The left ventricle has severely decreased function. The left ventricle demonstrates global hypokinesis. The left ventricular internal cavity size was mildly dilated.  2. Right ventricular systolic function is moderately reduced. The right ventricular size is mildly enlarged. There is normal pulmonary artery systolic pressure.  3. Left atrial size was mildly dilated. No left atrial/left atrial appendage thrombus was detected.  4. The mitral valve is normal in structure. Mild mitral valve regurgitation. Severe mitral annular calcification.  5. Tricuspid valve regurgitation is severe.  6. The aortic valve is tricuspid. There is moderate calcification of the aortic valve. There is moderate thickening of the aortic valve. Aortic valve regurgitation is not visualized. FINDINGS  Left Ventricle: Left ventricular ejection fraction, by estimation, is <20%. The left ventricle has severely decreased function. The left ventricle demonstrates global hypokinesis. The left ventricular internal cavity size was mildly dilated. Right Ventricle: The right ventricular size is mildly enlarged. No increase in right ventricular wall thickness. Right ventricular systolic function is moderately reduced. There is normal pulmonary artery systolic pressure. The tricuspid regurgitant velocity is 2.79 m/s, and with an assumed right atrial pressure of 3 mmHg, the estimated right ventricular systolic pressure is 34.1 mmHg. Left Atrium: Left atrial size was mildly dilated. No left atrial/left atrial appendage thrombus was  detected. Right Atrium: Right atrial size was normal in size. Pericardium: There is no evidence of pericardial effusion. Mitral Valve: The mitral valve is normal in structure. Severe mitral annular calcification. Mild mitral valve regurgitation. Tricuspid Valve: The tricuspid valve is normal in structure. Tricuspid valve regurgitation is severe. Aortic Valve: The aortic valve is tricuspid. There is moderate calcification of the aortic valve. There is moderate thickening of the aortic valve. Aortic valve regurgitation is not visualized. Pulmonic Valve: The pulmonic valve was  normal in structure. Pulmonic valve regurgitation is not visualized. Aorta: The aortic root is normal in size and structure. IAS/Shunts: No atrial level shunt detected by color flow Doppler. Additional Comments: Spectral Doppler performed. TRICUSPID VALVE TR Peak grad:   31.1 mmHg TR Vmax:        279.00 cm/s Donato Schultz MD Electronically signed by Donato Schultz MD Signature Date/Time: 05/24/2023/4:05:56 PM    Final    Medications:   (feeding supplement) PROSource Plus  30 mL Oral BID BM   allopurinol  100 mg Oral q AM   amiodarone  200 mg Oral Daily   apixaban  2.5 mg Oral BID   atorvastatin  40 mg Oral QHS   calcitRIOL  1.25 mcg Oral Q M,W,F-HD   Chlorhexidine Gluconate Cloth  6 each Topical Q0600   gabapentin  300 mg Oral QHS   insulin aspart  0-6 Units Subcutaneous Q4H   insulin aspart  2 Units Subcutaneous TID WC   insulin NPH Human  10 Units Subcutaneous QAC breakfast   midodrine  10 mg Oral TID WC   sodium chloride flush  3 mL Intravenous Q12H   tamsulosin  0.4 mg Oral QHS    Dialysis Orders: NW MWF 400/auto 1.5 3K/2.5Ca EDW 68.1 4h -Heparin 2500 units IV three times per week  - Calcitriol 1.25 mcg PO  three times per week No ESA  Assessment/Plan: A fib with RVR complicated by hypotension:  cardiology consulting - on amio now. TS/p TEE. No thrombus. CTA neg PE.     2. ESRD on HD MWF: MWF. Had serial HD 11/18-11/20  due to volume overload. He is now off schedule. He still has edema dn hyponatremia. Will plan for HD today. Due to the holiday, his HD schedule will be Sunday, Tuesday, Friday next week.    3. HFrEF with evidence of volume overload. EF 30-35% Cards following. EF 20% by TEE. Continue to titrate volume down as tolerated   4. HTN: dx HTN at baseline, now with hypotension in setting of a fib, hold meds. On midodrine 10 mg PO TID. Continue to attempt to optimize volume with HD.    5. Anemia of CKD:  Hb 11.1, no ESA indicated at this time.    6. BMM: Calcium a bit low. Resuming his PO calcitriol. Phosphorus is close to goal   7. Nutrition: Very low albumin. Continue protein supplements  Rogers Blocker, PA-C 05/26/2023, 8:28 AM  [facility] Pager: [phone]

## 2023-05-26 NOTE — Plan of Care (Signed)
Care plan reviewed.

## 2023-05-27 DIAGNOSIS — I4891 Unspecified atrial fibrillation: Secondary | ICD-10-CM | POA: Diagnosis not present

## 2023-05-27 LAB — HEMOGLOBIN AND HEMATOCRIT, BLOOD
HCT: 29.4 % — ABNORMAL LOW (ref 39.0–52.0)
Hemoglobin: 10.2 g/dL — ABNORMAL LOW (ref 13.0–17.0)

## 2023-05-27 LAB — BASIC METABOLIC PANEL
Anion gap: 13 (ref 5–15)
BUN: 47 mg/dL — ABNORMAL HIGH (ref 8–23)
CO2: 22 mmol/L (ref 22–32)
Calcium: 8 mg/dL — ABNORMAL LOW (ref 8.9–10.3)
Chloride: 91 mmol/L — ABNORMAL LOW (ref 98–111)
Creatinine, Ser: 3.52 mg/dL — ABNORMAL HIGH (ref 0.61–1.24)
GFR, Estimated: 16 mL/min — ABNORMAL LOW (ref >60)
Glucose, Bld: 121 mg/dL — ABNORMAL HIGH (ref 70–99)
Potassium: 3.8 mmol/L (ref 3.5–5.1)
Sodium: 126 mmol/L — ABNORMAL LOW (ref 135–145)

## 2023-05-27 LAB — CBC
HCT: 30 % — ABNORMAL LOW (ref 39.0–52.0)
Hemoglobin: 10.4 g/dL — ABNORMAL LOW (ref 13.0–17.0)
MCH: 33.9 pg (ref 26.0–34.0)
MCHC: 34.7 g/dL (ref 30.0–36.0)
MCV: 97.7 fL (ref 80.0–100.0)
Platelets: 65 10*3/uL — ABNORMAL LOW (ref 150–400)
RBC: 3.07 MIL/uL — ABNORMAL LOW (ref 4.22–5.81)
RDW: 17 % — ABNORMAL HIGH (ref 11.5–15.5)
WBC: 6.2 10*3/uL (ref 4.0–10.5)
nRBC: 0 % (ref 0.0–0.2)

## 2023-05-27 LAB — LACTIC ACID, PLASMA: Lactic Acid, Venous: 1.2 mmol/L (ref 0.5–1.9)

## 2023-05-27 LAB — GLUCOSE, CAPILLARY
Glucose-Capillary: 145 mg/dL — ABNORMAL HIGH (ref 70–99)
Glucose-Capillary: 145 mg/dL — ABNORMAL HIGH (ref 70–99)
Glucose-Capillary: 280 mg/dL — ABNORMAL HIGH (ref 70–99)
Glucose-Capillary: 316 mg/dL — ABNORMAL HIGH (ref 70–99)

## 2023-05-27 LAB — MAGNESIUM: Magnesium: 1.5 mg/dL — ABNORMAL LOW (ref 1.7–2.4)

## 2023-05-27 LAB — PHOSPHORUS: Phosphorus: 3.8 mg/dL (ref 2.5–4.6)

## 2023-05-27 MED ORDER — MAGNESIUM SULFATE 2 GM/50ML IV SOLN
2.0000 g | Freq: Once | INTRAVENOUS | Status: AC
  Administered 2023-05-27: 2 g via INTRAVENOUS
  Filled 2023-05-27: qty 50

## 2023-05-27 NOTE — Progress Notes (Signed)
PROGRESS NOTE    Robert Lucas  BJY:[id number] DOB: 26-Dec-1940 DOA: 05/18/2023 PCP: Joycelyn Rua, MD   Brief Narrative:  This 82 yrs old male with PMH significant for ESRD on HD MWF ,  DM2, Combined systolic/diastolic heart failure with LVEF 30-35%, HLD, anemia, CAD, HTN, hx. of MI, gout, Presented with hypotension, atrial fibrillation with RVR. He was started on IV amiodarone and  IV heparin, subsequently transitioned to eliqus.  He underwent successful  DCCV to SR.   Assessment & Plan:   Principal Problem:   Atrial fibrillation with RVR (HCC) Active Problems:   DM2 (diabetes mellitus, type 2) (HCC)   Essential hypertension   Hyperlipidemia   Coronary artery disease involving native coronary artery of native heart without angina pectoris   Chronic combined systolic and diastolic CHF (congestive heart failure) (HCC)   ESRD (end stage renal disease) (HCC)   Acute respiratory failure with hypoxia (HCC)   Hypotension  Atrial fibrillation with RVR: Initially started on IV Cardizem then transitioned to amiodarone gtt.due to hypotension. Cardiology on board, s/p TEE/ DCCV > NSR TSH > WNL. Echocardiogram reviewed.  LVEF 25 to 30%, regional wall motion abnormalities. Heart rate now well controlled. Continue amiodarone and anticoagulation.  Cardiology signed off  Acute on chronic combined CHF: Continue Fluid management with HD. Nephrology on board.  Continue midodrine10 mg 3 times daily.   Hypotension:  BP parameters are borderline low.  Continue Midodrine.    Diabetes Mellitus type 2: Uncontrolled with hyperglycemia  Continue with Novolin N 10 units daily, SSI and 2 units of Novolog added TIDAC.  HbA1c is 8.8. Carb modified diet.   ESRD on HD: Nephrology on board and appreciate recommendations.  Continue hemodialysis as per schedule.   Hyperlipidemia: Continue statin.    BPH: Continue with flomax.     Hypervolemic Hyponatremia: Likely from ESRD and fluid  overload.  Continue hemodialysis as per schedule.   Elevated liver enzymes Increased liver echotexture consistent with hepatic steatosis.  Improved. Bilirubin wnl.  Could be due to CHF. Continue to monitor liver enzymes   Mild leukocytosis: Resolved.   Elevated lactic acid  possibly from CHF. Repeat in am.    Obesity: Estimated body mass index is 30.6 kg/m as calculated from the following:   Height as of this encounter: 5\' 2"  (1.575 m).   Weight as of this encounter: 75.9 kg.  Thrombocytopenia: Monitor platelet count.  No signs of active bleeding.  DVT prophylaxis: Eliquis Code Status:Full code Family Communication: No family at bed side. Disposition Plan:    Status is: Inpatient Remains inpatient appropriate because: Admitted for A-fib with RVR and hypotension.  Cardiology is consulted,  started on amiodarone, Patient remains on Eliquis.   He remains on hemodialysis as per schedule. He underwent successful cardioversion.  PT and OT recommended SNF.  Awaiting insurance authorization.   Consultants:  Cardiology Nephrology  Procedures:  scheduled DCCV 11/21 Antimicrobials:  Anti-infectives (From admission, onward)    None       Subjective: Patient was seen and examined at bedside. Overnight events noted.   Patient reports doing much better.  Heart rate is well-controlled. He is status post successful cardioversion to NSR.   Objective: Vitals:   05/27/23 0426 05/27/23 0815 05/27/23 1135 05/27/23 1150  BP:  (!) 132/45 102/60   Pulse:  (!) 103 76 78  Resp:  20 17 16   Temp:  98.4 F (36.9 C) 98.9 F (37.2 C)   TempSrc:  Oral Oral   SpO2:  99% 97% 96%  Weight: 75.9 kg     Height:        Intake/Output Summary (Last 24 hours) at 05/27/2023 1302 Last data filed at 05/26/2023 2136 Gross per 24 hour  Intake 100 ml  Output 2900 ml  Net -2800 ml   Filed Weights   05/26/23 1440 05/26/23 1845 05/27/23 0426  Weight: 82.5 kg 77.5 kg 75.9 kg     Examination: General exam: Appears comfortable, deconditioned, not in any acute distress. Respiratory system: CTA bilaterally.  Respiratory effort normal.  RR 13 Cardiovascular system: S1 & S2 heard, regular rate and rhythm, no murmur.   Gastrointestinal system: Abdomen is non distended, soft and non tender. Normal bowel sounds heard. Central nervous system: Alert and oriented x 3. No focal neurological deficits. Extremities: No edema, No cyanosis, No clubbing Skin: No rashes, lesions or ulcers Psychiatry: Judgement and insight appear normal. Mood & affect appropriate.     Data Reviewed: I have personally reviewed following labs and imaging studies  CBC: Recent Labs  Lab 05/24/23 0319 05/25/23 0325 05/26/23 0228 05/26/23 1147 05/27/23 0305  WBC 6.1 6.0 5.1 4.7 6.2  HGB 11.6* 10.8* 11.1* 10.9* 10.4*  HCT 33.1* 31.0* 31.5* 31.4* 30.0*  MCV 100.3* 99.4 97.5 96.9 97.7  PLT 60* 57* 59* 61* 65*   Basic Metabolic Panel: Recent Labs  Lab 05/22/23 1343 05/23/23 0928 05/24/23 0319 05/25/23 0850 05/26/23 0228 05/27/23 0305  NA 123* 125* 126* 123* 122* 126*  K 3.6 4.5 3.8 4.5 3.8 3.8  CL 90* 89* 91* 88* 87* 91*  CO2 19* 21* 26 17* 23 22  GLUCOSE 335* 277* 154* 190* 185* 121*  BUN 53* 55* 34* 61* 72* 47*  CREATININE 3.69* 4.10* 3.05* 4.34* 4.74* 3.52*  CALCIUM 7.5* 8.1* 8.0* 7.8* 7.8* 8.0*  MG  --   --  1.7  --  1.8 1.5*  PHOS 5.3*  --  4.4  --  5.7* 3.8   GFR: Estimated Creatinine Clearance: 13.9 mL/min (A) (by C-G formula based on SCr of 3.52 mg/dL (H)). Liver Function Tests: Recent Labs  Lab 05/21/23 0445 05/22/23 1343  AST 24  --   ALT 61*  --   ALKPHOS 161*  --   BILITOT 1.1  --   PROT 5.2*  --   ALBUMIN 2.3* 2.1*   No results for input(s): "LIPASE", "AMYLASE" in the last 168 hours. No results for input(s): "AMMONIA" in the last 168 hours. Coagulation Profile: No results for input(s): "INR", "PROTIME" in the last 168 hours.  Cardiac Enzymes: No  results for input(s): "CKTOTAL", "CKMB", "CKMBINDEX", "TROPONINI" in the last 168 hours. BNP (last 3 results) No results for input(s): "PROBNP" in the last 8760 hours. HbA1C: No results for input(s): "HGBA1C" in the last 72 hours. CBG: Recent Labs  Lab 05/26/23 0738 05/26/23 1133 05/26/23 2129 05/27/23 0650 05/27/23 1131  GLUCAP 179* 153* 139* 145* 145*   Lipid Profile: No results for input(s): "CHOL", "HDL", "LDLCALC", "TRIG", "CHOLHDL", "LDLDIRECT" in the last 72 hours. Thyroid Function Tests: No results for input(s): "TSH", "T4TOTAL", "FREET4", "T3FREE", "THYROIDAB" in the last 72 hours. Anemia Panel: No results for input(s): "VITAMINB12", "FOLATE", "FERRITIN", "TIBC", "IRON", "RETICCTPCT" in the last 72 hours. Sepsis Labs: Recent Labs  Lab 05/21/23 0445 05/27/23 0305  LATICACIDVEN 2.3* 1.2    Recent Results (from the past 240 hour(s))  MRSA Next Gen by PCR, Nasal     Status: None   Collection Time: 05/22/23  4:40 AM   Specimen:  Nasal Mucosa; Nasal Swab  Result Value Ref Range Status   MRSA by PCR Next Gen NOT DETECTED NOT DETECTED Final    Comment: (NOTE) The GeneXpert MRSA Assay (FDA approved for NASAL specimens only), is one component of a comprehensive MRSA colonization surveillance program. It is not intended to diagnose MRSA infection nor to guide or monitor treatment for MRSA infections. Test performance is not FDA approved in patients less than 30 years old. Performed at [facility], [address], [place], [place] [address]     Radiology Studies: No results found.  Scheduled Meds:  (feeding supplement) PROSource Plus  30 mL Oral BID BM   allopurinol  100 mg Oral q AM   amiodarone  200 mg Oral Daily   apixaban  2.5 mg Oral BID   atorvastatin  40 mg Oral QHS   calcitRIOL  1.25 mcg Oral Q M,W,F-HD   Chlorhexidine Gluconate Cloth  6 each Topical Q0600   gabapentin  300 mg Oral QHS   heparin  2,500 Units Dialysis Once in dialysis   insulin  aspart  0-6 Units Subcutaneous TID AC & HS   insulin aspart  2 Units Subcutaneous TID WC   insulin NPH Human  10 Units Subcutaneous QAC breakfast   midodrine  10 mg Oral TID WC   sodium chloride flush  3 mL Intravenous Q12H   tamsulosin  0.4 mg Oral QHS   Continuous Infusions:  anticoagulant sodium citrate        LOS: 8 days    Time spent: 35 mins    Willeen Niece, MD [facility]   If 7PM-7AM, please contact night-coverage

## 2023-05-27 NOTE — Plan of Care (Signed)

## 2023-05-27 NOTE — Progress Notes (Signed)
[facility] Progress Note   Subjective:   Patient had HD yesterday evening with 2.9L UF. He feels his edema is improving. Denies SOB, CP, dizziness, nausea. Waiting for insurance auth for SNF.   Objective Vitals:   05/27/23 0043 05/27/23 0335 05/27/23 0426 05/27/23 0815  BP: (!) 102/49 (!) 96/51  (!) 132/45  Pulse: 94 99  (!) 103  Resp: (!) 21 20  20   Temp: 98.1 F (36.7 C) 99 F (37.2 C)  98.4 F (36.9 C)  TempSrc: Oral Oral  Oral  SpO2: 95% 98%  99%  Weight:   75.9 kg   Height:       Physical Exam General: Alert male in NAD Heart: irregularly irregular rhythm, no murmurs auscultated  Lungs: + expiratory wheeze LLL, no rhonchi or rales auscultated Abdomen: Soft, non-distended, +BS Extremities: 1+ edema b/l lower extremities Dialysis Access: AVG + t/b  Additional Objective Labs: Basic Metabolic Panel: Recent Labs  Lab 05/24/23 0319 05/25/23 0850 05/26/23 0228 05/27/23 0305  NA 126* 123* 122* 126*  K 3.8 4.5 3.8 3.8  CL 91* 88* 87* 91*  CO2 26 17* 23 22  GLUCOSE 154* 190* 185* 121*  BUN 34* 61* 72* 47*  CREATININE 3.05* 4.34* 4.74* 3.52*  CALCIUM 8.0* 7.8* 7.8* 8.0*  PHOS 4.4  --  5.7* 3.8   Liver Function Tests: Recent Labs  Lab 05/21/23 0445 05/22/23 1343  AST 24  --   ALT 61*  --   ALKPHOS 161*  --   BILITOT 1.1  --   PROT 5.2*  --   ALBUMIN 2.3* 2.1*   No results for input(s): "LIPASE", "AMYLASE" in the last 168 hours. CBC: Recent Labs  Lab 05/24/23 0319 05/25/23 0325 05/26/23 0228 05/26/23 1147 05/27/23 0305  WBC 6.1 6.0 5.1 4.7 6.2  HGB 11.6* 10.8* 11.1* 10.9* 10.4*  HCT 33.1* 31.0* 31.5* 31.4* 30.0*  MCV 100.3* 99.4 97.5 96.9 97.7  PLT 60* 57* 59* 61* 65*   Blood Culture    Component Value Date/Time   SDES BLOOD RIGHT HAND 12/18/2022 0716   SPECREQUEST  12/18/2022 0716    BOTTLES DRAWN AEROBIC ONLY Blood Culture results may not be optimal due to an inadequate volume of blood received in culture bottles   CULT   12/18/2022 0716    NO GROWTH 5 DAYS Performed at [facility], [address], [place], [place] [address]    REPTSTATUS 12/23/2022 FINAL 12/18/2022 0716    Cardiac Enzymes: No results for input(s): "CKTOTAL", "CKMB", "CKMBINDEX", "TROPONINI" in the last 168 hours. CBG: Recent Labs  Lab 05/26/23 0647 05/26/23 0738 05/26/23 1133 05/26/23 2129 05/27/23 0650  GLUCAP 147* 179* 153* 139* 145*   Iron Studies: No results for input(s): "IRON", "TIBC", "TRANSFERRIN", "FERRITIN" in the last 72 hours. @lablastinr3 @ Studies/Results: No results found. Medications:  anticoagulant sodium citrate     magnesium sulfate bolus IVPB      (feeding supplement) PROSource Plus  30 mL Oral BID BM   allopurinol  100 mg Oral q AM   amiodarone  200 mg Oral Daily   apixaban  2.5 mg Oral BID   atorvastatin  40 mg Oral QHS   calcitRIOL  1.25 mcg Oral Q M,W,F-HD   Chlorhexidine Gluconate Cloth  6 each Topical Q0600   gabapentin  300 mg Oral QHS   heparin  2,500 Units Dialysis Once in dialysis   insulin aspart  0-6 Units Subcutaneous TID AC & HS   insulin aspart  2 Units Subcutaneous TID WC   insulin NPH Human  10 Units Subcutaneous QAC breakfast   midodrine  10 mg Oral TID WC   sodium chloride flush  3 mL Intravenous Q12H   tamsulosin  0.4 mg Oral QHS    Dialysis Orders: NW MWF 400/auto 1.5 3K/2.5Ca EDW 68.1 4h -Heparin 2500 units IV three times per week  - Calcitriol 1.25 mcg PO  three times per week No ESA  Assessment/Plan:  A fib with RVR complicated by hypotension:  cardiology consulting - on amio now. TS/p TEE. No thrombus. CTA neg PE.     2. ESRD on HD MWF: MWF. Had serial HD 11/18-11/20 due to volume overload. He is now off schedule. He still has edema and hyponatremia, but improving. He prefers to wait until tomorrow for next HD to avoid evening dialysis and is not in any distress, will plan for next HD tomorrow. Tentatively plan to continue MWF schedule this week, unsure what his  outpatient schedule will be.    3. HFrEF with evidence of volume overload. EF 30-35% Cards following. EF 20% by TEE. Continue to titrate volume down as tolerated   4. HTN: dx HTN at baseline, now with hypotension in setting of a fib, hold meds. On midodrine 10 mg PO TID. Continue to attempt to optimize volume with HD.    5. Anemia of CKD:  Hb 10.4. no ESA indicated at this time.    6. BMM: Calcium was a bit low. Resumed his PO calcitriol. Phosphorus is at goal   7. Nutrition: Very low albumin. Continue protein supplements  Rogers Blocker, PA-C 05/27/2023, 9:00 AM  [facility] Pager: [phone]

## 2023-05-27 NOTE — Progress Notes (Signed)
Mobility Specialist Progress Note:    05/27/23 1115  Mobility  Activity Transferred from bed to chair  Level of Assistance Moderate assist, patient does 50-74%  Assistive Device Front wheel walker  Distance Ambulated (ft) 4 ft  Activity Response Tolerated well  Mobility Referral Yes  $Mobility charge 1 Mobility  Mobility Specialist Start Time (ACUTE ONLY) 0845  Mobility Specialist Stop Time (ACUTE ONLY) 0900  Mobility Specialist Time Calculation (min) (ACUTE ONLY) 15 min   Pt received in bed agreeable to mobility. Needed ModA w/ bed mobility and STS. Was able to take a couple of steps towards the chair w/o fault. No c/o throughout. Call bell and personal belongings in reach. All needs met.  Thompson Grayer Mobility Specialist  Please contact vis Secure Chat or  Rehab Office [phone]

## 2023-05-27 NOTE — Plan of Care (Signed)

## 2023-05-27 NOTE — Progress Notes (Signed)
Dr Idelle Leech notified of visible blood in stool. MD stated to check Hgb and cont to monitor patient and stool

## 2023-05-28 DIAGNOSIS — I4891 Unspecified atrial fibrillation: Secondary | ICD-10-CM | POA: Diagnosis not present

## 2023-05-28 LAB — MAGNESIUM: Magnesium: 2.1 mg/dL (ref 1.7–2.4)

## 2023-05-28 LAB — RENAL FUNCTION PANEL
Albumin: 2.3 g/dL — ABNORMAL LOW (ref 3.5–5.0)
Anion gap: 12 (ref 5–15)
BUN: 37 mg/dL — ABNORMAL HIGH (ref 8–23)
CO2: 24 mmol/L (ref 22–32)
Calcium: 8.2 mg/dL — ABNORMAL LOW (ref 8.9–10.3)
Chloride: 89 mmol/L — ABNORMAL LOW (ref 98–111)
Creatinine, Ser: 2.89 mg/dL — ABNORMAL HIGH (ref 0.61–1.24)
GFR, Estimated: 21 mL/min — ABNORMAL LOW (ref >60)
Glucose, Bld: 223 mg/dL — ABNORMAL HIGH (ref 70–99)
Phosphorus: 3.3 mg/dL (ref 2.5–4.6)
Potassium: 3.6 mmol/L (ref 3.5–5.1)
Sodium: 125 mmol/L — ABNORMAL LOW (ref 135–145)

## 2023-05-28 LAB — GLUCOSE, CAPILLARY
Glucose-Capillary: 106 mg/dL — ABNORMAL HIGH (ref 70–99)
Glucose-Capillary: 190 mg/dL — ABNORMAL HIGH (ref 70–99)
Glucose-Capillary: 217 mg/dL — ABNORMAL HIGH (ref 70–99)
Glucose-Capillary: 203 mg/dL — ABNORMAL HIGH (ref 70–99)

## 2023-05-28 LAB — CBC
HCT: 29.4 % — ABNORMAL LOW (ref 39.0–52.0)
Hemoglobin: 10.5 g/dL — ABNORMAL LOW (ref 13.0–17.0)
MCH: 35 pg — ABNORMAL HIGH (ref 26.0–34.0)
MCHC: 35.7 g/dL (ref 30.0–36.0)
MCV: 98 fL (ref 80.0–100.0)
Platelets: 78 10*3/uL — ABNORMAL LOW (ref 150–400)
RBC: 3 MIL/uL — ABNORMAL LOW (ref 4.22–5.81)
RDW: 17.2 % — ABNORMAL HIGH (ref 11.5–15.5)
WBC: 6 10*3/uL (ref 4.0–10.5)
nRBC: 0 % (ref 0.0–0.2)

## 2023-05-28 LAB — BASIC METABOLIC PANEL
Anion gap: 10 (ref 5–15)
BUN: 71 mg/dL — ABNORMAL HIGH (ref 8–23)
CO2: 22 mmol/L (ref 22–32)
Calcium: 8.1 mg/dL — ABNORMAL LOW (ref 8.9–10.3)
Chloride: 86 mmol/L — ABNORMAL LOW (ref 98–111)
Creatinine, Ser: 4.41 mg/dL — ABNORMAL HIGH (ref 0.61–1.24)
GFR, Estimated: 13 mL/min — ABNORMAL LOW (ref >60)
Glucose, Bld: 125 mg/dL — ABNORMAL HIGH (ref 70–99)
Potassium: 3.7 mmol/L (ref 3.5–5.1)
Sodium: 118 mmol/L — CL (ref 135–145)

## 2023-05-28 LAB — PHOSPHORUS: Phosphorus: 5 mg/dL — ABNORMAL HIGH (ref 2.5–4.6)

## 2023-05-28 NOTE — Progress Notes (Signed)
Occupational Therapy Treatment Patient Details Name: Robert Lucas MRN: [id number] DOB: [birth year] Today's Date: 05/28/2023   History of present illness 82 y.o. male presents to [facility] 05/18/23 w/ hypotension and a-fib w/ RVR. Prior admission in June for sepsis due to CAP. PMHx: ESRD on HD MWF, DM2, systolic/diastolic HF EF 30-35%, HLD, anemia, CAD, HTN, hx of MI, gout   OT comments  Pt requiring increased assist for bed mobility and sequencing AE use for LB ADLs today despite reportedly familiarity with use of AE. Increased assist perhaps due to early HD this morning. Pt back to bed at end of session and set up for breakfast. Patient will benefit from continued inpatient follow up therapy, <3 hours/day at DC.      If plan is discharge home, recommend the following:  A little help with walking and/or transfers;Assistance with cooking/housework;A lot of help with bathing/dressing/bathroom   Equipment Recommendations  None recommended by OT    Recommendations for Other Services      Precautions / Restrictions Precautions Precautions: Fall Precaution Comments: monitor BP, HR Restrictions Weight Bearing Restrictions: No       Mobility Bed Mobility Overal bed mobility: Needs Assistance Bed Mobility: Sit to Supine, Supine to Sit     Supine to sit: Mod assist, HOB elevated, Used rails Sit to supine: Mod assist   General bed mobility comments: increased assist needed to lift trunk and scoot hips to EOB. assist for trunk and LE back to bed    Transfers Overall transfer level: Needs assistance                 General transfer comment: preferred scooting along bedside rather than standing/stepping to get higher in bed. Pt required Mod A to scoot hips towards HOB     Balance Overall balance assessment: Needs assistance Sitting-balance support: Feet supported, No upper extremity supported Sitting balance-Leahy Scale: Fair Sitting balance - Comments: minor posterior bias  with dynamic tasks with CGA to Min A to correct Postural control: Posterior lean                                 ADL either performed or assessed with clinical judgement   ADL Overall ADL's : Needs assistance/impaired Eating/Feeding: Independent;Bed level                   Lower Body Dressing: Moderate assistance;Sitting/lateral leans;With adaptive equipment Lower Body Dressing Details (indicate cue type and reason): focus on AE education on use of reacher, dressing stick, shoehorn for sock/shoe mgmt. pt reports familiar with sock aide though had difficulty following directions and using effectively.                    Extremity/Trunk Assessment Upper Extremity Assessment Upper Extremity Assessment: Right hand dominant;LUE deficits/detail LUE Deficits / Details: noted LUE swollen, not painful. elevated at end of session, unsure if due to HD   Lower Extremity Assessment Lower Extremity Assessment: Defer to PT evaluation        Vision   Vision Assessment?: No apparent visual deficits   Perception     Praxis      Cognition Arousal: Alert Behavior During Therapy: WFL for tasks assessed/performed Overall Cognitive Status: Impaired/Different from baseline Area of Impairment: Problem solving, Awareness, Following commands, Attention                   Current Attention Level: Selective  Following Commands: Follows one step commands with increased time   Awareness: Intellectual, Emergent Problem Solving: Slow processing, Decreased initiation, Difficulty sequencing, Requires verbal cues, Requires tactile cues General Comments: some minor confusion this AM w/ difficulty following directions for AE use. unsure if due to fatigue from HD early this AM        Exercises      Shoulder Instructions       General Comments      Pertinent Vitals/ Pain       Pain Assessment Pain Assessment: No/denies pain  Home Living                                           Prior Functioning/Environment              Frequency  Min 1X/week        Progress Toward Goals  OT Goals(current goals can now be found in the care plan section)  Progress towards OT goals: OT to reassess next treatment  Acute Rehab OT Goals Patient Stated Goal: discharge today OT Goal Formulation: With patient Time For Goal Achievement: 06/03/23 Potential to Achieve Goals: Good ADL Goals Pt Will Perform Grooming: with modified independence;standing Pt Will Perform Lower Body Dressing: with modified independence;sit to/from stand Pt Will Transfer to Toilet: with modified independence;ambulating  Plan      Co-evaluation                 AM-PAC OT "6 Clicks" Daily Activity     Outcome Measure   Help from another person eating meals?: None Help from another person taking care of personal grooming?: A Little Help from another person toileting, which includes using toliet, bedpan, or urinal?: A Little Help from another person bathing (including washing, rinsing, drying)?: A Little Help from another person to put on and taking off regular upper body clothing?: A Little Help from another person to put on and taking off regular lower body clothing?: A Lot 6 Click Score: 18    End of Session    OT Visit Diagnosis: Unsteadiness on feet (R26.81);Other abnormalities of gait and mobility (R26.89);Muscle weakness (generalized) (M62.81)   Activity Tolerance Patient limited by fatigue   Patient Left in bed;with call bell/phone within reach;with bed alarm set   Nurse Communication Other (comment) (discussed with NT)        Time: 0822-0850 OT Time Calculation (min): 28 min  Charges: OT General Charges $OT Visit: 1 Visit OT Treatments $Self Care/Home Management : 8-22 mins $Therapeutic Activity: 8-22 mins  Bradd Canary, OTR/L Acute Rehab Services Office: [phone]   Lorre Munroe 05/28/2023, 9:05 AM

## 2023-05-28 NOTE — Progress Notes (Signed)
Physical Therapy Treatment Patient Details Name: Robert Lucas MRN: [id number] DOB: August 02, 1940 Today's Date: 05/28/2023   History of Present Illness 82 y.o. male presents to [facility] 05/18/23 w/ hypotension and a-fib w/ RVR. Prior admission in June for sepsis due to CAP. PMHx: ESRD on HD MWF, DM2, systolic/diastolic HF EF 30-35%, HLD, anemia, CAD, HTN, hx of MI, gout    PT Comments  Pt making slow, steady progress and able to amb up to 38' today with assist. Remains generally weak and continue to feel patient will benefit from continued inpatient follow up therapy, <3 hours/day     If plan is discharge home, recommend the following: A lot of help with walking and/or transfers;A lot of help with bathing/dressing/bathroom;Assistance with cooking/housework;Assist for transportation;Help with stairs or ramp for entrance   Can travel by private vehicle     Yes  Equipment Recommendations  None recommended by PT    Recommendations for Other Services       Precautions / Restrictions Precautions Precautions: Fall Precaution Comments: monitor BP, HR Restrictions Weight Bearing Restrictions: No     Mobility  Bed Mobility               General bed mobility comments: Pt up in recliner    Transfers Overall transfer level: Needs assistance Equipment used: Rolling walker (2 wheels) Transfers: Sit to/from Stand Sit to Stand: Mod assist           General transfer comment: Assist to bring hips up and for balance. Verbal cues for hand placement prior to sitting    Ambulation/Gait Ambulation/Gait assistance: Min assist, +2 safety/equipment Gait Distance (Feet): 50 Feet (20' x 1, 50' x 1) Assistive device: Rolling walker (2 wheels) Gait Pattern/deviations: Step-through pattern, Shuffle, Trunk flexed, Decreased stride length Gait velocity: decr Gait velocity interpretation: <1.31 ft/sec, indicative of household ambulator   General Gait Details: Assist for balance and support  with 2nd person following with chair   Stairs             Wheelchair Mobility     Tilt Bed    Modified Rankin (Stroke Patients Only)       Balance Overall balance assessment: Needs assistance Sitting-balance support: Feet supported, No upper extremity supported Sitting balance-Leahy Scale: Fair     Standing balance support: Bilateral upper extremity supported, During functional activity, Reliant on assistive device for balance Standing balance-Leahy Scale: Poor Standing balance comment: walker and min assist for static standing                            Cognition Arousal: Alert Behavior During Therapy: WFL for tasks assessed/performed Overall Cognitive Status: Impaired/Different from baseline Area of Impairment: Problem solving, Awareness, Following commands, Attention                   Current Attention Level: Selective   Following Commands: Follows one step commands with increased time   Awareness: Emergent Problem Solving: Slow processing, Requires verbal cues          Exercises      General Comments General comments (skin integrity, edema, etc.): VSS on RA      Pertinent Vitals/Pain Pain Assessment Pain Assessment: No/denies pain    Home Living                          Prior Function  PT Goals (current goals can now be found in the care plan section) Progress towards PT goals: Progressing toward goals    Frequency    Min 1X/week      PT Plan      Co-evaluation              AM-PAC PT "6 Clicks" Mobility   Outcome Measure  Help needed turning from your back to your side while in a flat bed without using bedrails?: A Little Help needed moving from lying on your back to sitting on the side of a flat bed without using bedrails?: A Lot Help needed moving to and from a bed to a chair (including a wheelchair)?: A Lot Help needed standing up from a chair using your arms (e.g., wheelchair or  bedside chair)?: A Lot Help needed to walk in hospital room?: A Lot Help needed climbing 3-5 steps with a railing? : Total 6 Click Score: 12    End of Session Equipment Utilized During Treatment: Gait belt Activity Tolerance: Patient tolerated treatment well Patient left: in chair;with call bell/phone within reach;with chair alarm set Nurse Communication: Mobility status PT Visit Diagnosis: Unsteadiness on feet (R26.81);Muscle weakness (generalized) (M62.81);Other abnormalities of gait and mobility (R26.89);Difficulty in walking, not elsewhere classified (R26.2)     Time: 8119-1478 PT Time Calculation (min) (ACUTE ONLY): 16 min  Charges:    $Gait Training: 8-22 mins PT General Charges $$ ACUTE PT VISIT: 1 Visit                     [facility] PT Acute Rehabilitation Services Office [phone]    Angelina Ok [facility] 05/28/2023, 12:52 PM

## 2023-05-28 NOTE — Plan of Care (Signed)
Problem: Fluid Volume: Goal: Ability to maintain a balanced intake and output will improve Outcome: Progressing   Problem: Metabolic: Goal: Ability to maintain appropriate glucose levels will improve Outcome: Progressing

## 2023-05-28 NOTE — Progress Notes (Addendum)
[name] Progress Note   Subjective: Up in chair. Says his discharge was canceled D/T low sodium. Denies SOB.      Objective Vitals:   05/28/23 0629 05/28/23 0647 05/28/23 0746 05/28/23 1145  BP: 98/67 (!) 115/51 97/81 118/63  Pulse: 73 70 80 79  Resp: 15 19 19 20   Temp:  97.7 F (36.5 C) 97.8 F (36.6 C) 97.8 F (36.6 C)  TempSrc:  Oral Oral Oral  SpO2: 94% 93% 100% 100%  Weight:      Height:       Physical Exam General: Chronically ill appearing male in NAD Heart: irreg, irreg No M/R/G. Afib on monitor. HR 100s Lungs: decreased in bases. No WOB Abdomen: obese, NABS Extremities: trace-1+ BLE edema posterior calves, edema in hips Dialysis Access: AVG + T/B  Additional Objective Labs: Basic Metabolic Panel: Recent Labs  Lab 05/26/23 0228 05/27/23 0305 05/28/23 0257  NA 122* 126* 118*  K 3.8 3.8 3.7  CL 87* 91* 86*  CO2 23 22 22   GLUCOSE 185* 121* 125*  BUN 72* 47* 71*  CREATININE 4.74* 3.52* 4.41*  CALCIUM 7.8* 8.0* 8.1*  PHOS 5.7* 3.8 5.0*   Liver Function Tests: Recent Labs  Lab 05/22/23 1343  ALBUMIN 2.1*   No results for input(s): "LIPASE", "AMYLASE" in the last 168 hours. CBC: Recent Labs  Lab 05/25/23 0325 05/26/23 0228 05/26/23 1147 05/27/23 0305 05/27/23 2047 05/28/23 0257  WBC 6.0 5.1 4.7 6.2  --  6.0  HGB 10.8* 11.1* 10.9* 10.4* 10.2* 10.5*  HCT 31.0* 31.5* 31.4* 30.0* 29.4* 29.4*  MCV 99.4 97.5 96.9 97.7  --  98.0  PLT 57* 59* 61* 65*  --  78*   Blood Culture    Component Value Date/Time   SDES BLOOD RIGHT HAND 12/18/2022 0716   SPECREQUEST  12/18/2022 0716    BOTTLES DRAWN AEROBIC ONLY Blood Culture results may not be optimal due to an inadequate volume of blood received in culture bottles   CULT  12/18/2022 0716    NO GROWTH 5 DAYS Performed at [facility], [address] [place], [place] [address]    REPTSTATUS 12/23/2022 FINAL 12/18/2022 0716    Cardiac Enzymes: No results for input(s):  "CKTOTAL", "CKMB", "CKMBINDEX", "TROPONINI" in the last 168 hours. CBG: Recent Labs  Lab 05/27/23 1131 05/27/23 1716 05/27/23 2123 05/28/23 0732 05/28/23 1143  GLUCAP 145* 280* 316* 106* 190*   Iron Studies: No results for input(s): "IRON", "TIBC", "TRANSFERRIN", "FERRITIN" in the last 72 hours. @lablastinr3 @ Studies/Results: No results found. Medications:   (feeding supplement) PROSource Plus  30 mL Oral BID BM   allopurinol  100 mg Oral q AM   amiodarone  200 mg Oral Daily   apixaban  2.5 mg Oral BID   atorvastatin  40 mg Oral QHS   calcitRIOL  1.25 mcg Oral Q M,W,F-HD   Chlorhexidine Gluconate Cloth  6 each Topical Q0600   gabapentin  300 mg Oral QHS   insulin aspart  0-6 Units Subcutaneous TID AC & HS   insulin aspart  2 Units Subcutaneous TID WC   insulin NPH Human  10 Units Subcutaneous QAC breakfast   midodrine  10 mg Oral TID WC   sodium chloride flush  3 mL Intravenous Q12H   tamsulosin  0.4 mg Oral QHS     Dialysis Orders: NW MWF 400/auto 1.5 3K/2.5Ca EDW 68.1 4h -Heparin 2500 units IV three times per week  - Calcitriol 1.25 mcg PO  three times per week No ESA   Assessment/Plan:  A fib with RVR complicated by hypotension:  cardiology consulting - on amio now. TS/p TEE. No thrombus. CTA neg PE.     2. ESRD on HD MWF: MWF. Had serial HD 11/18-11/20 due to volume overload. He is now off schedule. He still has edema and hyponatremia, but improving. He prefers to wait until tomorrow for next HD to avoid evening dialysis and is not in any distress, will plan for next HD tomorrow. Tentatively plan to continue MWF schedule this week, unsure what his outpatient schedule will be. Extra HD 05/29/2023   3. HFrEF with evidence of volume overload. EF 30-35% Cards following. EF 20% by TEE. Continue to titrate volume down as tolerated. He will need dialysis 4 days a week on discharge.    4. HTN: dx HTN at baseline, now with hypotension in setting of a fib, hold meds. On  midodrine 10 mg PO TID. Continue to attempt to optimize volume with HD.    5. Anemia of CKD:  Hb 10.4. no ESA indicated at this time.    6. BMM: Calcium was a bit low. Resumed his PO calcitriol. Phosphorus is at goal   7. Nutrition: Very low albumin. Continue protein supplements  8. Hyponatremia: Na has progressively declined despite aggressive volume removal. This AM Na 118. Rechecking labs now.     Robert Lucas H. Skai Lickteig NP-C 05/28/2023, 12:43 PM  [employer] [phone]

## 2023-05-28 NOTE — NC FL2 (Signed)
[place] MEDICAID FL2 LEVEL OF CARE FORM     IDENTIFICATION  Patient Name: Robert Lucas Birthdate: 1940/09/10 Sex: male Admission Date (Current Location): 05/18/2023  [facility] and [place] Number:  Producer, television/film/video and Address:  [facility], [address] [place], [place] [address]      Provider Number: [id number]  Attending Physician Name and Address:  Willeen Niece, MD  Relative Name and Phone Number:       Current Level of Care: Hospital Recommended Level of Care: Skilled Nursing Facility Prior Approval Number:    Date Approved/Denied:   PASRR Number: [id number]  Discharge Plan: SNF    Current Diagnoses: Patient Active Problem List   Diagnosis Date Noted   Atrial fibrillation with RVR (HCC) 05/18/2023   Hypotension 05/18/2023   Severe sepsis (HCC) 12/18/2022   Acute hyponatremia 12/18/2022   Hyperkalemia 12/18/2022   Acute respiratory failure with hypoxia (HCC) 12/17/2022   Atrial tachycardia (HCC) 06/20/2022   NSVT (nonsustained ventricular tachycardia) (HCC) 06/20/2022   ESRD (end stage renal disease) (HCC) 03/16/2022   Type 2 diabetes mellitus with obesity (HCC) 10/18/2021   Acute respiratory failure with hypoxia and hypercapnia (HCC)    Acute metabolic encephalopathy    Aspiration pneumonia of both lower lobes due to vomit (HCC)    OSA (obstructive sleep apnea) 10/14/2021   Obesity hypoventilation syndrome (HCC) 10/14/2021   Fluid overload 10/13/2021   Hypothermia 10/13/2021   ARF (acute renal failure) (HCC) 10/13/2021   CKD (chronic kidney disease), stage V (HCC) 09/21/2021   History of anemia due to chronic kidney disease 08/27/2021   Chronic combined systolic and diastolic CHF (congestive heart failure) (HCC) 08/25/2021   Acute renal failure superimposed on stage 4 chronic kidney disease (HCC) 08/25/2021   Stage 3 chronic kidney disease due to type 2 diabetes mellitus (HCC) 07/01/2021   Age-related nuclear  cataract, bilateral 07/01/2021   Allergic rhinitis 07/01/2021   Allergic rhinitis due to pollen 07/01/2021   Arthropathy 07/01/2021   Calculus of gallbladder without cholecystitis without obstruction 07/01/2021   Chronic kidney disease, stage 4 (severe) (HCC) 07/01/2021   Constipation 07/01/2021   Diabetic renal disease (HCC) 07/01/2021   Enlarged prostate 07/01/2021   Gallbladder calculus with acute cholecystitis and no obstruction 07/01/2021   Hypertensive heart disease without congestive heart failure 07/01/2021   Hypertrophy of prostate with urinary obstruction and other lower urinary tract symptoms (LUTS) 07/01/2021   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 associated with type 2 diabetes mellitus (HCC) 07/01/2021   Obstructive sleep apnea syndrome 07/01/2021   Old myocardial infarction 07/01/2021   Other specified counseling 07/01/2021   Postherpetic neuralgia 07/01/2021   Postsurgical percutaneous transluminal coronary angioplasty status 07/01/2021   Presence of aortocoronary bypass graft 07/01/2021   Proteinuria 07/01/2021   Senile purpura (HCC) 07/01/2021   Vitamin D deficiency 07/01/2021   HFrEF (heart failure with reduced ejection fraction) (HCC) 07/12/2020   Lobar pneumonia (HCC) 07/10/2020   CAP (community acquired pneumonia) 07/08/2020   Complication of surgical procedure 09/08/2019   Lumbar radiculopathy 09/08/2019   Degeneration of lumbar intervertebral disc 08/27/2019   Pain of left hip joint 07/17/2019   Obese 05/07/2019   S/P right THA, AA 05/06/2019   Coronary artery disease involving native coronary artery of native heart without angina pectoris 01/28/2019   Leg edema 01/28/2019   Pain in joint of right hip 11/19/2018   Complete tear of rotator cuff 04/23/2013   Chest pain  10/13/2012   DM2 (diabetes mellitus, type 2) (HCC) 10/13/2012   Essential hypertension 10/13/2012   Hyperlipidemia 10/13/2012   Gout 10/13/2012    Bell's palsy 10/13/2012   Left-sided weakness 10/13/2012    Orientation RESPIRATION BLADDER Height & Weight     Self, Time, Situation, Place  O2 (2L [place]) Continent Weight: 167 lb 5.3 oz (75.9 kg) Height:  5\' 2"  (157.5 cm)  BEHAVIORAL SYMPTOMS/MOOD NEUROLOGICAL BOWEL NUTRITION STATUS      Continent Diet (see dc summary)  AMBULATORY STATUS COMMUNICATION OF NEEDS Skin   Limited Assist Verbally Normal                       Personal Care Assistance Level of Assistance  Bathing, Feeding, Dressing Bathing Assistance: Limited assistance Feeding assistance: Independent Dressing Assistance: Limited assistance     Functional Limitations Info  Sight, Hearing, Speech Sight Info: Impaired (glassess) Hearing Info: Adequate Speech Info: Adequate    SPECIAL CARE FACTORS FREQUENCY  PT (By licensed PT), OT (By licensed OT)     PT Frequency: 5x week OT Frequency: 5x week            Contractures Contractures Info: Not present    Additional Factors Info  Code Status, Allergies, Insulin Sliding Scale Code Status Info: Full Allergies Info: Zestril (Lisinopril), Hydrocodone, Januvia (Sitagliptin), Levitra (Vardenafil), Prednisone, Victoza (Liraglutide), Penicillins   Insulin Sliding Scale Info: See dc summary       Current Medications (05/28/2023):  This is the current hospital active medication list Current Facility-Administered Medications  Medication Dose Route Frequency Provider Last Rate Last Admin   (feeding supplement) PROSource Plus liquid 30 mL  30 mL Oral BID BM Jake Bathe, MD   30 mL at 05/28/23 0906   acetaminophen (TYLENOL) tablet 650 mg  650 mg Oral Q6H PRN Jake Bathe, MD       Or   acetaminophen (TYLENOL) suppository 650 mg  650 mg Rectal Q6H PRN Jake Bathe, MD       allopurinol (ZYLOPRIM) tablet 100 mg  100 mg Oral q AM Jake Bathe, MD   100 mg at 05/28/23 1610   amiodarone (PACERONE) tablet 200 mg  200 mg Oral Daily Weston Brass A, MD   200 mg  at 05/28/23 9604   apixaban (ELIQUIS) tablet 2.5 mg  2.5 mg Oral BID Jake Bathe, MD   2.5 mg at 05/27/23 2146   atorvastatin (LIPITOR) tablet 40 mg  40 mg Oral QHS Jake Bathe, MD   40 mg at 05/27/23 2146   calcitRIOL (ROCALTROL) capsule 1.25 mcg  1.25 mcg Oral Q M,W,F-HD Rogers Blocker G, PA-C   1.25 mcg at 05/28/23 1207   Chlorhexidine Gluconate Cloth 2 % PADS 6 each  6 each Topical Q0600 Oretha Milch, PA-C   6 each at 05/28/23 0700   gabapentin (NEURONTIN) capsule 300 mg  300 mg Oral QHS Jake Bathe, MD   300 mg at 05/27/23 2146   insulin aspart (novoLOG) injection 0-6 Units  0-6 Units Subcutaneous TID AC & HS Willeen Niece, MD   1 Units at 05/28/23 1208   insulin aspart (novoLOG) injection 2 Units  2 Units Subcutaneous TID WC Jake Bathe, MD   2 Units at 05/28/23 1208   insulin NPH Human (NOVOLIN N) injection 10 Units  10 Units Subcutaneous QAC breakfast Jake Bathe, MD   10 Units at 05/28/23 0912   midodrine (PROAMATINE) tablet 10 mg  10 mg Oral TID WC Jake Bathe, MD   10 mg at 05/28/23 1207   ondansetron (ZOFRAN) tablet 4 mg  4 mg Oral Q6H PRN Jake Bathe, MD       Or   ondansetron (ZOFRAN) injection 4 mg  4 mg Intravenous Q6H PRN Jake Bathe, MD       phenol (CHLORASEPTIC) mouth spray 1 spray  1 spray Mouth/Throat PRN Willeen Niece, MD   1 spray at 05/26/23 0218   sodium chloride flush (NS) 0.9 % injection 3 mL  3 mL Intravenous Q12H Donato Schultz C, MD   3 mL at 05/27/23 2150   sodium chloride flush (NS) 0.9 % injection 3 mL  3 mL Intravenous PRN Jake Bathe, MD       tamsulosin (FLOMAX) capsule 0.4 mg  0.4 mg Oral QHS Jake Bathe, MD   0.4 mg at 05/27/23 2146     Discharge Medications: Please see discharge summary for a list of discharge medications.  Relevant Imaging Results:  Relevant Lab Results:   Additional Information SS# [id number]  Michaela Corner, LCSWA

## 2023-05-28 NOTE — Progress Notes (Signed)
Mobility Specialist Progress Note:    05/28/23 1220  Mobility  Activity Stood at bedside (@ chair x4)  Level of Assistance Minimal assist, patient does 75% or more  Assistive Device Front wheel walker  Activity Response Tolerated well  Mobility Referral Yes  $Mobility charge 1 Mobility  Mobility Specialist Start Time (ACUTE ONLY) 1115  Mobility Specialist Stop Time (ACUTE ONLY) 1127  Mobility Specialist Time Calculation (min) (ACUTE ONLY) 12 min   Pt received in chair agreeable to mobility. Was able to perform x4 STS w/ MinA w/o fault. No c/o throughout. Left in chair w/ call bell and personal belongings in reach. All needs met.  Thompson Grayer Mobility Specialist  Please contact vis Secure Chat or  Rehab Office [phone]

## 2023-05-28 NOTE — Progress Notes (Signed)
Medical clearance/approval still pending with [facility] for [facility] snf den. Awaiting determination from [facility]. Will assist as needed. Will provide update to CSW.   Olivia Canter Renal Navigator [phone]

## 2023-05-28 NOTE — Progress Notes (Signed)
Received patient in bed to unit.  Alert and oriented.  Informed consent signed and in chart.   TX duration: 3:30  Patient tolerated well.  Transported back to the room  Alert, without acute distress.  Hand-off given to patient's nurse.   Access used: LUE AVG Access issues: None  Total UF removed: 3000 mL Medication(s) given: None Post HD VS: please see data insert    05/28/23 0647  Vitals  Temp 97.7 F (36.5 C)  Temp Source Oral  BP (!) 115/51  MAP (mmHg) 71  BP Location Right Arm  BP Method Automatic  Patient Position (if appropriate) Lying  Pulse Rate 70  Pulse Rate Source Monitor  ECG Heart Rate 83  Resp 19  Oxygen Therapy  SpO2 93 %  O2 Device Room Air  Patient Activity (if Appropriate) In bed  Pulse Oximetry Type Continuous  Post Treatment  Dialyzer Clearance Lightly streaked  Hemodialysis Intake (mL) 0 mL  Liters Processed 83.9  Fluid Removed (mL) 3000 mL  Tolerated HD Treatment Yes  Post-Hemodialysis Comments Treatment completed and blood returned without issue.  AVG/AVF Arterial Site Held (minutes) 6 minutes (Gauze applied and secured with paper tape; hemostasis achieved.)  AVG/AVF Venous Site Held (minutes) 6 minutes (Gauze applied and secured with paper tape; hemostasis achieved.)  Note  Patient Observations Patient alert, no c/o voiced, no acute distress noted; patient condition stable for return transport.  Fistula / Graft Left Upper arm Arteriovenous vein graft  Placement Date/Time: 05/02/22 1001   Placed prior to admission: No  Orientation: Left  Access Location: Upper arm  Access Type: Arteriovenous vein graft  Expiration Date: 11/22/26  Site Condition No complications  Fistula / Graft Assessment Present;Thrill;Bruit  Status Flushed;Patent;Deaccessed  Drainage Description None      [facility]

## 2023-05-28 NOTE — TOC Progression Note (Signed)
Transition of Care [facility]) - Progression Note    Patient Details  Name: MANSOUR LOGGINS MRN: [id number] Date of Birth: [birth year]  Transition [facility]) CM/SW Contact  Michaela Corner, [place] Phone Number: 05/28/2023, 11:24 AM  Clinical Narrative:   CSW spoke with Rikki at [facility], who confirmed pt can dc to SNF tomorrow morning if medically stable.    Expected Discharge Plan: Skilled Nursing Facility Barriers to Discharge: Continued Medical Work up, English as a second language teacher, SNF Pending bed offer  Expected Discharge Plan and Services In-house Referral: NA Discharge Planning Services: CM Consult Post Acute Care Choice: NA Living arrangements for the past 2 months: Single Family Home                 DME Arranged: N/A DME Agency: NA       HH Arranged: PT, OT, Patient Refused HH           Social Determinants of Health (SDOH) Interventions SDOH Screenings   Food Insecurity: No Food Insecurity (05/19/2023)  Housing: Low Risk  (05/19/2023)  Transportation Needs: No Transportation Needs (05/19/2023)  Utilities: Not At Risk (05/19/2023)  Tobacco Use: Medium Risk (05/19/2023)    Readmission Risk Interventions    05/21/2023    4:06 PM 12/20/2022    1:38 PM  Readmission Risk Prevention Plan  Transportation Screening Complete Complete  PCP or Specialist Appt within 5-7 Days  Complete  PCP or Specialist Appt within 3-5 Days Complete   Home Care Screening  Complete  Medication Review (RN CM)  Complete  HRI or Home Care Consult Complete   Palliative Care Screening Not Applicable   Medication Review (RN Care Manager) Complete

## 2023-05-28 NOTE — Plan of Care (Signed)
  Problem: Education: Goal: Ability to describe self-care measures that may prevent or decrease complications (Diabetes Survival Skills Education) will improve Outcome: Adequate for Discharge   Problem: Coping: Goal: Ability to adjust to condition or change in health will improve Outcome: Adequate for Discharge   Problem: Fluid Volume: Goal: Ability to maintain a balanced intake and output will improve Outcome: Adequate for Discharge   Problem: Clinical Measurements: Goal: Cardiovascular complication will be avoided Outcome: Adequate for Discharge   Problem: Activity: Goal: Risk for activity intolerance will decrease Outcome: Adequate for Discharge   Problem: Nutrition: Goal: Adequate nutrition will be maintained Outcome: Adequate for Discharge   Problem: Coping: Goal: Level of anxiety will decrease Outcome: Adequate for Discharge

## 2023-05-28 NOTE — Progress Notes (Signed)
PROGRESS NOTE    Robert Lucas  NWG:[id number] DOB: Oct 25, 1940 DOA: 05/18/2023 PCP: Joycelyn Rua, MD   Brief Narrative:  This 82 yrs old male with PMH significant for ESRD on HD MWF ,  DM2, Combined systolic/diastolic heart failure with LVEF 30-35%, HLD, anemia, CAD, HTN, hx. of MI, gout, Presented with hypotension, atrial fibrillation with RVR. He was started on IV amiodarone and  IV heparin, subsequently transitioned to eliqus.  He underwent successful  DCCV to SR.   Assessment & Plan:   Principal Problem:   Atrial fibrillation with RVR (HCC) Active Problems:   DM2 (diabetes mellitus, type 2) (HCC)   Essential hypertension   Hyperlipidemia   Coronary artery disease involving native coronary artery of native heart without angina pectoris   Chronic combined systolic and diastolic CHF (congestive heart failure) (HCC)   ESRD (end stage renal disease) (HCC)   Acute respiratory failure with hypoxia (HCC)   Hypotension  Atrial fibrillation with RVR: Initially started on IV Cardizem then transitioned to amiodarone gtt.due to hypotension. Cardiology on board, s/p TEE/ DCCV > NSR TSH > WNL. Echocardiogram reviewed.  LVEF 25 to 30%, regional wall motion abnormalities. Heart rate now well controlled. Continue amiodarone and anticoagulation.  Cardiology signed off.  Acute on chronic combined CHF: Continue Fluid management with HD. Nephrology on board.  Continue midodrine10 mg 3 times daily.   Hypotension:  BP parameters are borderline low.  Continue Midodrine.    Diabetes Mellitus type 2: Uncontrolled with hyperglycemia  Continue with Novolin N 10 units daily, SSI and 2 units of Novolog added TIDAC.  HbA1c is 8.8. Carb modified diet.   ESRD on HD: Nephrology on board and appreciate recommendations.  Continue hemodialysis as per schedule.   Hyperlipidemia: Continue statin.    BPH: Continue with flomax.     Hypervolemic Hyponatremia: Likely from ESRD and fluid  overload.  Continue hemodialysis as per schedule. Nephrology is aware ,   Elevated liver enzymes Increased liver echotexture consistent with hepatic steatosis.  Improved. Bilirubin wnl.  Could be due to CHF. Continue to monitor liver enzymes   Mild leukocytosis: Resolved.   Elevated lactic acid: possibly from CHF. Resolved.   Obesity: Estimated body mass index is 30.6 kg/m as calculated from the following:   Height as of this encounter: 5\' 2"  (1.575 m).   Weight as of this encounter: 75.9 kg.  Thrombocytopenia: Monitor platelet count.  No signs of active bleeding.  DVT prophylaxis: Eliquis Code Status:Full code Family Communication: No family at bed side. Disposition Plan:    Status is: Inpatient Remains inpatient appropriate because: Admitted for A-fib with RVR and hypotension.  Cardiology consulted,  started on amiodarone, Patient remains on Eliquis.   He remains on hemodialysis as per schedule. He underwent successful cardioversion.  PT and OT recommended SNF.  Awaiting insurance authorization.   Consultants:  Cardiology Nephrology  Procedures:  scheduled DCCV 11/21 Antimicrobials:  Anti-infectives (From admission, onward)    None       Subjective: Patient was seen and examined at bedside. Overnight events noted.   Patient reports not feeling better.  Heart rate is well-controlled. He is status post successful cardioversion to NSR. His sodium is critically low at 118.     Objective: Vitals:   05/28/23 0629 05/28/23 0647 05/28/23 0746 05/28/23 1145  BP: 98/67 (!) 115/51 97/81 118/63  Pulse: 73 70 80 79  Resp: 15 19 19 20   Temp:  97.7 F (36.5 C) 97.8 F (36.6 C) 97.8 F (  36.6 C)  TempSrc:  Oral Oral Oral  SpO2: 94% 93% 100% 100%  Weight:      Height:        Intake/Output Summary (Last 24 hours) at 05/28/2023 1331 Last data filed at 05/28/2023 1258 Gross per 24 hour  Intake 366.42 ml  Output 3000 ml  Net -2633.58 ml   Filed Weights    05/26/23 1440 05/26/23 1845 05/27/23 0426  Weight: 82.5 kg 77.5 kg 75.9 kg    Examination: General exam: Appears comfortable, deconditioned, not in any acute distress. Respiratory system: CTA bilaterally.  Respiratory effort normal.  RR 14 Cardiovascular system: S1 & S2 heard, regular rate and rhythm, no murmur.   Gastrointestinal system: Abdomen is non distended, soft and non tender. Normal bowel sounds heard. Central nervous system: Alert and oriented x 3. No focal neurological deficits. Extremities: No edema, No cyanosis, No clubbing Skin: No rashes, lesions or ulcers Psychiatry: Judgement and insight appear normal. Mood & affect appropriate.     Data Reviewed: I have personally reviewed following labs and imaging studies  CBC: Recent Labs  Lab 05/25/23 0325 05/26/23 0228 05/26/23 1147 05/27/23 0305 05/27/23 2047 05/28/23 0257  WBC 6.0 5.1 4.7 6.2  --  6.0  HGB 10.8* 11.1* 10.9* 10.4* 10.2* 10.5*  HCT 31.0* 31.5* 31.4* 30.0* 29.4* 29.4*  MCV 99.4 97.5 96.9 97.7  --  98.0  PLT 57* 59* 61* 65*  --  78*   Basic Metabolic Panel: Recent Labs  Lab 05/22/23 1343 05/23/23 0928 05/24/23 0319 05/25/23 0850 05/26/23 0228 05/27/23 0305 05/28/23 0257  NA 123*   < > 126* 123* 122* 126* 118*  K 3.6   < > 3.8 4.5 3.8 3.8 3.7  CL 90*   < > 91* 88* 87* 91* 86*  CO2 19*   < > 26 17* 23 22 22   GLUCOSE 335*   < > 154* 190* 185* 121* 125*  BUN 53*   < > 34* 61* 72* 47* 71*  CREATININE 3.69*   < > 3.05* 4.34* 4.74* 3.52* 4.41*  CALCIUM 7.5*   < > 8.0* 7.8* 7.8* 8.0* 8.1*  MG  --   --  1.7  --  1.8 1.5* 2.1  PHOS 5.3*  --  4.4  --  5.7* 3.8 5.0*   < > = values in this interval not displayed.   GFR: Estimated Creatinine Clearance: 11.1 mL/min (A) (by C-G formula based on SCr of 4.41 mg/dL (H)). Liver Function Tests: Recent Labs  Lab 05/22/23 1343  ALBUMIN 2.1*   No results for input(s): "LIPASE", "AMYLASE" in the last 168 hours. No results for input(s): "AMMONIA" in the last  168 hours. Coagulation Profile: No results for input(s): "INR", "PROTIME" in the last 168 hours.  Cardiac Enzymes: No results for input(s): "CKTOTAL", "CKMB", "CKMBINDEX", "TROPONINI" in the last 168 hours. BNP (last 3 results) No results for input(s): "PROBNP" in the last 8760 hours. HbA1C: No results for input(s): "HGBA1C" in the last 72 hours. CBG: Recent Labs  Lab 05/27/23 1131 05/27/23 1716 05/27/23 2123 05/28/23 0732 05/28/23 1143  GLUCAP 145* 280* 316* 106* 190*   Lipid Profile: No results for input(s): "CHOL", "HDL", "LDLCALC", "TRIG", "CHOLHDL", "LDLDIRECT" in the last 72 hours. Thyroid Function Tests: No results for input(s): "TSH", "T4TOTAL", "FREET4", "T3FREE", "THYROIDAB" in the last 72 hours. Anemia Panel: No results for input(s): "VITAMINB12", "FOLATE", "FERRITIN", "TIBC", "IRON", "RETICCTPCT" in the last 72 hours. Sepsis Labs: Recent Labs  Lab 05/27/23 0305  LATICACIDVEN 1.2  Recent Results (from the past 240 hour(s))  MRSA Next Gen by PCR, Nasal     Status: None   Collection Time: 05/22/23  4:40 AM   Specimen: Nasal Mucosa; Nasal Swab  Result Value Ref Range Status   MRSA by PCR Next Gen NOT DETECTED NOT DETECTED Final    Comment: (NOTE) The GeneXpert MRSA Assay (FDA approved for NASAL specimens only), is one component of a comprehensive MRSA colonization surveillance program. It is not intended to diagnose MRSA infection nor to guide or monitor treatment for MRSA infections. Test performance is not FDA approved in patients less than 10 years old. Performed at [facility], [address], [place], [place] [address]     Radiology Studies: No results found.  Scheduled Meds:  (feeding supplement) PROSource Plus  30 mL Oral BID BM   allopurinol  100 mg Oral q AM   amiodarone  200 mg Oral Daily   apixaban  2.5 mg Oral BID   atorvastatin  40 mg Oral QHS   calcitRIOL  1.25 mcg Oral Q M,W,F-HD   Chlorhexidine Gluconate Cloth  6 each Topical  Q0600   gabapentin  300 mg Oral QHS   insulin aspart  0-6 Units Subcutaneous TID AC & HS   insulin aspart  2 Units Subcutaneous TID WC   insulin NPH Human  10 Units Subcutaneous QAC breakfast   midodrine  10 mg Oral TID WC   sodium chloride flush  3 mL Intravenous Q12H   tamsulosin  0.4 mg Oral QHS   Continuous Infusions:    LOS: 9 days    Time spent: 35 mins    Willeen Niece, MD [facility]   If 7PM-7AM, please contact night-coverage

## 2023-05-29 DIAGNOSIS — I4891 Unspecified atrial fibrillation: Secondary | ICD-10-CM | POA: Diagnosis not present

## 2023-05-29 LAB — CBC
HCT: 32.2 % — ABNORMAL LOW (ref 39.0–52.0)
Hemoglobin: 11.2 g/dL — ABNORMAL LOW (ref 13.0–17.0)
MCH: 34.4 pg — ABNORMAL HIGH (ref 26.0–34.0)
MCHC: 34.8 g/dL (ref 30.0–36.0)
MCV: 98.8 fL (ref 80.0–100.0)
Platelets: 88 10*3/uL — ABNORMAL LOW (ref 150–400)
RBC: 3.26 MIL/uL — ABNORMAL LOW (ref 4.22–5.81)
RDW: 17.3 % — ABNORMAL HIGH (ref 11.5–15.5)
WBC: 5.2 10*3/uL (ref 4.0–10.5)
nRBC: 0 % (ref 0.0–0.2)

## 2023-05-29 LAB — GLUCOSE, CAPILLARY
Glucose-Capillary: 209 mg/dL — ABNORMAL HIGH (ref 70–99)
Glucose-Capillary: 214 mg/dL — ABNORMAL HIGH (ref 70–99)
Glucose-Capillary: 130 mg/dL — ABNORMAL HIGH (ref 70–99)

## 2023-05-29 LAB — BASIC METABOLIC PANEL
Anion gap: 12 (ref 5–15)
BUN: 52 mg/dL — ABNORMAL HIGH (ref 8–23)
CO2: 23 mmol/L (ref 22–32)
Calcium: 8.2 mg/dL — ABNORMAL LOW (ref 8.9–10.3)
Chloride: 88 mmol/L — ABNORMAL LOW (ref 98–111)
Creatinine, Ser: 3.48 mg/dL — ABNORMAL HIGH (ref 0.61–1.24)
GFR, Estimated: 17 mL/min — ABNORMAL LOW (ref >60)
Glucose, Bld: 185 mg/dL — ABNORMAL HIGH (ref 70–99)
Potassium: 3.8 mmol/L (ref 3.5–5.1)
Sodium: 123 mmol/L — ABNORMAL LOW (ref 135–145)

## 2023-05-29 MED ORDER — ALBUMIN HUMAN 25 % IV SOLN
25.0000 g | Freq: Once | INTRAVENOUS | Status: AC
  Administered 2023-05-29: 25 g via INTRAVENOUS
  Filled 2023-05-29: qty 100

## 2023-05-29 NOTE — TOC Progression Note (Signed)
Transition of Care [facility]) - Progression Note    Patient Details  Name: Robert Lucas MRN: [id number] Date of Birth: [birth year]  Transition of Care [facility]) CM/SW Contact  Michaela Corner, [place] Phone Number: 05/29/2023, 10:31 AM  Clinical Narrative:   CSW called and left a VM for Rikki with [facility] to confirm pts dc to facility.     Expected Discharge Plan: Skilled Nursing Facility Barriers to Discharge: Continued Medical Work up, English as a second language teacher, SNF Pending bed offer  Expected Discharge Plan and Services In-house Referral: NA Discharge Planning Services: CM Consult Post Acute Care Choice: NA Living arrangements for the past 2 months: Single Family Home                 DME Arranged: N/A DME Agency: NA       HH Arranged: PT, OT, Patient Refused HH           Social Determinants of Health (SDOH) Interventions SDOH Screenings   Food Insecurity: No Food Insecurity (05/19/2023)  Housing: Low Risk  (05/19/2023)  Transportation Needs: No Transportation Needs (05/19/2023)  Utilities: Not At Risk (05/19/2023)  Tobacco Use: Medium Risk (05/19/2023)    Readmission Risk Interventions    05/21/2023    4:06 PM 12/20/2022    1:38 PM  Readmission Risk Prevention Plan  Transportation Screening Complete Complete  PCP or Specialist Appt within 5-7 Days  Complete  PCP or Specialist Appt within 3-5 Days Complete   Home Care Screening  Complete  Medication Review (RN CM)  Complete  HRI or Home Care Consult Complete   Palliative Care Screening Not Applicable   Medication Review (RN Care Manager) Complete

## 2023-05-29 NOTE — Progress Notes (Addendum)
CSW advised that navigator is awaiting medical clearance/approval from [facility] for pt's HD at [facility] on[facility]site [facility]. Will provide update to CSW as soon as update received from [facility] admissions staff.   Olivia Canter Renal Navigator [phone]  Addendum at 2:04 pm: [facility] staff have requested additional information this afternoon for review by medical director. Information provided and awaiting response.   Addendum at 2:42 pm: Pt has been approved to receive HD at [facility] on-site HD unit through [facility]. Pt can have 1st treatment tomorrow if d/c today. At snf, pt will receive HD on Mon,Tues,Wed, and Fri. Navigator advised that staff would determine how long pt's treatment will be based on pt's weight and regular HD criteria. Update provided to attending, nephrologist, renal NP, and CSW.   Addendum at 2:47 pm: [facility] staff advised that pt will not d/c today in order to have first treatment at [facility] tomorrow.

## 2023-05-29 NOTE — Progress Notes (Signed)
[facility] Progress Note   Subjective: patient seen and examined bedside. Na did improve to 125 yesterday. Still feels swollen. Discussed HD plan and he reports that he already had it this am (?? He did not). He is eager to be discharged to facility. No complaints otherwise  Objective Vitals:   05/28/23 1919 05/29/23 0058 05/29/23 0442 05/29/23 0828  BP: (!) 119/51 (!) 110/52 (!) 136/57 (!) 117/54  Pulse: 80 87 95 93  Resp: 20 20 20 20   Temp: 97.9 F (36.6 C) 98.9 F (37.2 C) 98.2 F (36.8 C) 98 F (36.7 C)  TempSrc: Oral Oral Oral Oral  SpO2: 100% 100% 100% 99%  Weight:   78.3 kg   Height:       Physical Exam General: Chronically ill appearing male in NAD, sitting up in bed Heart: irreg, irreg No M/R/G Lungs: decreased in bases. Normal wob/unlabored Abdomen: obese, NABS Extremities: trace-1+ BLE edema Neuro: awake, following commands Dialysis Access: AVG + T/B  Additional Objective Labs: Basic Metabolic Panel: Recent Labs  Lab 05/27/23 0305 05/28/23 0257 05/28/23 1328 05/29/23 0316  NA 126* 118* 125* 123*  K 3.8 3.7 3.6 3.8  CL 91* 86* 89* 88*  CO2 22 22 24 23   GLUCOSE 121* 125* 223* 185*  BUN 47* 71* 37* 52*  CREATININE 3.52* 4.41* 2.89* 3.48*  CALCIUM 8.0* 8.1* 8.2* 8.2*  PHOS 3.8 5.0* 3.3  --    Liver Function Tests: Recent Labs  Lab 05/22/23 1343 05/28/23 1328  ALBUMIN 2.1* 2.3*   No results for input(s): "LIPASE", "AMYLASE" in the last 168 hours. CBC: Recent Labs  Lab 05/26/23 0228 05/26/23 1147 05/27/23 0305 05/27/23 2047 05/28/23 0257 05/29/23 0316  WBC 5.1 4.7 6.2  --  6.0 5.2  HGB 11.1* 10.9* 10.4* 10.2* 10.5* 11.2*  HCT 31.5* 31.4* 30.0* 29.4* 29.4* 32.2*  MCV 97.5 96.9 97.7  --  98.0 98.8  PLT 59* 61* 65*  --  78* 88*   Blood Culture    Component Value Date/Time   SDES BLOOD RIGHT HAND 12/18/2022 0716   SPECREQUEST  12/18/2022 0716    BOTTLES DRAWN AEROBIC ONLY Blood Culture results may not be optimal due to an  inadequate volume of blood received in culture bottles   CULT  12/18/2022 0716    NO GROWTH 5 DAYS Performed at [facility], [address] [place], [place] [address]    REPTSTATUS 12/23/2022 FINAL 12/18/2022 0716    Cardiac Enzymes: No results for input(s): "CKTOTAL", "CKMB", "CKMBINDEX", "TROPONINI" in the last 168 hours. CBG: Recent Labs  Lab 05/28/23 0732 05/28/23 1143 05/28/23 1529 05/28/23 2109 05/29/23 0608  GLUCAP 106* 190* 217* 203* 209*   Iron Studies: No results for input(s): "IRON", "TIBC", "TRANSFERRIN", "FERRITIN" in the last 72 hours. @lablastinr3 @ Studies/Results: No results found. Medications:   (feeding supplement) PROSource Plus  30 mL Oral BID BM   allopurinol  100 mg Oral q AM   amiodarone  200 mg Oral Daily   apixaban  2.5 mg Oral BID   atorvastatin  40 mg Oral QHS   calcitRIOL  1.25 mcg Oral Q M,W,F-HD   Chlorhexidine Gluconate Cloth  6 each Topical Q0600   gabapentin  300 mg Oral QHS   insulin aspart  0-6 Units Subcutaneous TID AC & HS   insulin aspart  2 Units Subcutaneous TID WC   insulin NPH Human  10 Units Subcutaneous QAC breakfast   midodrine  10 mg Oral TID WC  sodium chloride flush  3 mL Intravenous Q12H   tamsulosin  0.4 mg Oral QHS     Dialysis Orders: NW MWF 400/auto 1.5 3K/2.5Ca EDW 68.1 4h -Heparin 2500 units IV three times per week  - Calcitriol 1.25 mcg PO  three times per week No ESA   Assessment/Plan:  A fib with RVR complicated by hypotension:  cardiology consulting - on amio now. TS/p TEE. No thrombus. CTA neg PE.     2. ESRD on HD MWF: MWF. Had serial HD 11/18-11/20 due to volume overload. He still has edema and hyponatremia, but improving. Tentatively plan to continue MWF schedule this week, unsure what his outpatient schedule will be but regardless we recommend HD 4 days a week. Extra HD 05/29/2023 today w/ sequential   3. HFrEF with evidence of volume overload. EF 30-35% Cards following. EF 20% by TEE.  Continue to titrate volume down as tolerated. He will need dialysis 4 days a week on discharge.    4. HTN: dx HTN at baseline, now with hypotension in setting of a fib, hold meds. On midodrine 10 mg PO TID. Continue to attempt to optimize volume with HD.    5. Anemia of CKD:  Hb 11.2. no ESA indicated at this time.    6. BMM: Calcium was a bit low. Resumed his PO calcitriol. Phosphorus is at goal   7. Nutrition: Very low albumin. Continue protein supplements  8. Hyponatremia, hypervolemic: will cont to attempt to manage with HD/UF. Na does improve post HD    Anthony Sar, MD [facility]

## 2023-05-29 NOTE — Plan of Care (Signed)
  Problem: Clinical Measurements: Goal: Respiratory complications will improve Outcome: Progressing Goal: Cardiovascular complication will be avoided Outcome: Progressing   Problem: Nutrition: Goal: Adequate nutrition will be maintained Outcome: Progressing   Problem: Coping: Goal: Level of anxiety will decrease Outcome: Progressing   Problem: Elimination: Goal: Will not experience complications related to urinary retention Outcome: Progressing   Problem: Safety: Goal: Ability to remain free from injury will improve Outcome: Progressing

## 2023-05-29 NOTE — Progress Notes (Signed)
On assessment, pt seems more drowsy today than yesterday. For example, eating with his eye closed and not giving as much effort as yesterday to get to the commode. Also, his cough seems more congested. MD notified

## 2023-05-29 NOTE — TOC Progression Note (Addendum)
Transition of Care [facility]) - Progression Note    Patient Details  Name: Robert Lucas MRN: [id number] Date of Birth: 01/06/1941  Transition [facility] Care [facility]) CM/SW Contact  Michaela Corner, [place] Phone Number: 05/29/2023, 10:42 AM  Clinical Narrative:   CSW spoke with [facility] at [facility] and confirmed pt can dc to facility today once dc summary is in.   2:28PM: Per MD, pt will not dc today to [facility]. CSW contacted [employer] about ins auth - per [facility] [employer] rep auth runs out tomorrow at midnight. Plan to possibly dc to snf tomorrow.     Expected Discharge Plan: Skilled Nursing Facility Barriers to Discharge: Continued Medical Work up, English as a second language teacher, SNF Pending bed offer  Expected Discharge Plan and Services In-house Referral: NA Discharge Planning Services: CM Consult Post Acute Care Choice: NA Living arrangements for the past 2 months: Single Family Home                 DME Arranged: N/A DME Agency: NA       HH Arranged: PT, OT, Patient Refused HH           Social Determinants of Health (SDOH) Interventions SDOH Screenings   Food Insecurity: No Food Insecurity (05/19/2023)  Housing: Low Risk  (05/19/2023)  Transportation Needs: No Transportation Needs (05/19/2023)  Utilities: Not At Risk (05/19/2023)  Tobacco Use: Medium Risk (05/19/2023)    Readmission Risk Interventions    05/21/2023    4:06 PM 12/20/2022    1:38 PM  Readmission Risk Prevention Plan  Transportation Screening Complete Complete  PCP or Specialist Appt within 5-7 Days  Complete  PCP or Specialist Appt within 3-5 Days Complete   Home Care Screening  Complete  Medication Review (RN CM)  Complete  HRI or Home Care Consult Complete   Palliative Care Screening Not Applicable   Medication Review (RN Care Manager) Complete

## 2023-05-29 NOTE — Progress Notes (Signed)
Mobility Specialist Progress Note:   05/29/23 1031  Mobility  Activity Transferred to/from [facility]  Level of Assistance +2 (takes two people) (MaxA)  Location manager Ambulated (ft) 5 ft  Activity Response Tolerated fair  Mobility Referral Yes  $Mobility charge 1 Mobility  Mobility Specialist Start Time (ACUTE ONLY) 0915  Mobility Specialist Stop Time (ACUTE ONLY) 0932  Mobility Specialist Time Calculation (min) (ACUTE ONLY) 17 min   Pt received on BSC agreeable to mobility. Attempted to get pt standing w/ MaxA, needed MaxA +2 to stand and transfer to the chair. Needed assistance w/ pericare. C/o fatigue, otherwise no c/o. Made it to the chair w/o fault. Call ball and personal belongings in reach. All needs met.   Thompson Grayer Mobility Specialist  Please contact vis Secure Chat or  Rehab Office [phone]

## 2023-05-29 NOTE — Progress Notes (Signed)
PROGRESS NOTE    Robert Lucas  EXB:[id number] DOB: March 19, 1941 DOA: 05/18/2023 PCP: Joycelyn Rua, MD   Brief Narrative:  This 82 yrs old male with PMH significant for ESRD on HD MWF ,  DM2, Combined systolic/diastolic heart failure with LVEF 30-35%, HLD, anemia, CAD, HTN, hx. of MI, gout, Presented with hypotension, atrial fibrillation with RVR. He was started on IV amiodarone and  IV heparin, subsequently transitioned to eliqus.  He underwent successful  DCCV to SR.   Assessment & Plan:   Principal Problem:   Atrial fibrillation with RVR (HCC) Active Problems:   DM2 (diabetes mellitus, type 2) (HCC)   Essential hypertension   Hyperlipidemia   Coronary artery disease involving native coronary artery of native heart without angina pectoris   Chronic combined systolic and diastolic CHF (congestive heart failure) (HCC)   ESRD (end stage renal disease) (HCC)   Acute respiratory failure with hypoxia (HCC)   Hypotension  Atrial fibrillation with RVR: Initially started on IV Cardizem then transitioned to amiodarone gtt.due to hypotension. Cardiology on board, s/p TEE/ DCCV > NSR TSH > WNL. Echocardiogram reviewed.  LVEF 25 to 30%, regional wall motion abnormalities. Heart rate now well controlled. Continue amiodarone and anticoagulation.  Cardiology signed off. Patient went in and out of A-fib, Cardiology was notified, this could be paroxysmal. No change in management.  Acute on chronic combined CHF: Continue Fluid management with HD. Nephrology on board.  Continue midodrine10 mg 3 times daily.   Hypotension:  BP parameters are borderline low.  Continue Midodrine.    Diabetes Mellitus type 2: Uncontrolled with hyperglycemia  Continue with Novolin N 10 units daily, SSI and 2 units of Novolog added TIDAC.  HbA1c is 8.8. Carb modified diet.   ESRD on HD: Nephrology on board and appreciate recommendations.  Continue hemodialysis as per schedule.    Hyperlipidemia: Continue statin.    BPH: Continue with flomax.     Hypervolemic Hyponatremia: Likely from ESRD and fluid overload.  Continue hemodialysis as per schedule. Nephrology is aware ,   Elevated liver enzymes Increased liver echotexture consistent with hepatic steatosis.  Improved. Bilirubin wnl.  Could be due to CHF. Continue to monitor liver enzymes.   Mild leukocytosis: Resolved.   Elevated lactic acid: possibly from CHF. Resolved.   Obesity: Estimated body mass index is 30.6 kg/m as calculated from the following:   Height as of this encounter: 5\' 2"  (1.575 m).   Weight as of this encounter: 75.9 kg.  Thrombocytopenia: Monitor platelet count.  No signs of active bleeding.  DVT prophylaxis: Eliquis Code Status:Full code Family Communication: No family at bed side. Disposition Plan:    Status is: Inpatient Remains inpatient appropriate because: Admitted for A-fib with RVR and hypotension.  Cardiology consulted,  started on amiodarone, Patient remains on Eliquis.   He remains on hemodialysis as per schedule. He underwent successful cardioversion.  PT and OT recommended SNF.  Awaiting insurance authorization.  Anticipated discharge to [facility] 05/30/2023   Consultants:  Cardiology Nephrology  Procedures:  scheduled DCCV 11/21 Antimicrobials:  Anti-infectives (From admission, onward)    None       Subjective: Patient was seen and examined at bedside. Overnight events noted.   He is status post successful cardioversion to NSR. Patient went in and out of A-fib last night. Remains in NSR His sodium has slightly improved to 123.   Objective: Vitals:   05/29/23 0058 05/29/23 0442 05/29/23 0828 05/29/23 1131  BP: (!) 110/52 (!) 136/57 (!) 117/54 Marland Kitchen)  106/54  Pulse: 87 95 93 79  Resp: 20 20 20 20   Temp: 98.9 F (37.2 C) 98.2 F (36.8 C) 98 F (36.7 C) 99.6 F (37.6 C)  TempSrc: Oral Oral Oral Oral  SpO2: 100% 100% 99% 94%  Weight:  78.3 kg     Height:        Intake/Output Summary (Last 24 hours) at 05/29/2023 1337 Last data filed at 05/29/2023 1254 Gross per 24 hour  Intake 537 ml  Output --  Net 537 ml   Filed Weights   05/26/23 1845 05/27/23 0426 05/29/23 0442  Weight: 77.5 kg 75.9 kg 78.3 kg    Examination: General exam: Appears comfortable, deconditioned, not in any acute distress. Respiratory system: CTA bilaterally.  Respiratory effort normal.  RR 14 Cardiovascular system: S1 & S2 heard, regular rate and rhythm, no murmur.   Gastrointestinal system: Abdomen is non distended, soft and non tender. Normal bowel sounds heard. Central nervous system: Alert and oriented x 3. No focal neurological deficits. Extremities: No edema, No cyanosis, No clubbing Skin: No rashes, lesions or ulcers Psychiatry: Judgement and insight appear normal. Mood & affect appropriate.     Data Reviewed: I have personally reviewed following labs and imaging studies  CBC: Recent Labs  Lab 05/26/23 0228 05/26/23 1147 05/27/23 0305 05/27/23 2047 05/28/23 0257 05/29/23 0316  WBC 5.1 4.7 6.2  --  6.0 5.2  HGB 11.1* 10.9* 10.4* 10.2* 10.5* 11.2*  HCT 31.5* 31.4* 30.0* 29.4* 29.4* 32.2*  MCV 97.5 96.9 97.7  --  98.0 98.8  PLT 59* 61* 65*  --  78* 88*   Basic Metabolic Panel: Recent Labs  Lab 05/24/23 0319 05/25/23 0850 05/26/23 0228 05/27/23 0305 05/28/23 0257 05/28/23 1328 05/29/23 0316  NA 126*   < > 122* 126* 118* 125* 123*  K 3.8   < > 3.8 3.8 3.7 3.6 3.8  CL 91*   < > 87* 91* 86* 89* 88*  CO2 26   < > 23 22 22 24 23   GLUCOSE 154*   < > 185* 121* 125* 223* 185*  BUN 34*   < > 72* 47* 71* 37* 52*  CREATININE 3.05*   < > 4.74* 3.52* 4.41* 2.89* 3.48*  CALCIUM 8.0*   < > 7.8* 8.0* 8.1* 8.2* 8.2*  MG 1.7  --  1.8 1.5* 2.1  --   --   PHOS 4.4  --  5.7* 3.8 5.0* 3.3  --    < > = values in this interval not displayed.   GFR: Estimated Creatinine Clearance: 14.3 mL/min (A) (by C-G formula based on SCr of 3.48 mg/dL  (H)). Liver Function Tests: Recent Labs  Lab 05/22/23 1343 05/28/23 1328  ALBUMIN 2.1* 2.3*   No results for input(s): "LIPASE", "AMYLASE" in the last 168 hours. No results for input(s): "AMMONIA" in the last 168 hours. Coagulation Profile: No results for input(s): "INR", "PROTIME" in the last 168 hours.  Cardiac Enzymes: No results for input(s): "CKTOTAL", "CKMB", "CKMBINDEX", "TROPONINI" in the last 168 hours. BNP (last 3 results) No results for input(s): "PROBNP" in the last 8760 hours. HbA1C: No results for input(s): "HGBA1C" in the last 72 hours. CBG: Recent Labs  Lab 05/28/23 1143 05/28/23 1529 05/28/23 2109 05/29/23 0608 05/29/23 1127  GLUCAP 190* 217* 203* 209* 214*   Lipid Profile: No results for input(s): "CHOL", "HDL", "LDLCALC", "TRIG", "CHOLHDL", "LDLDIRECT" in the last 72 hours. Thyroid Function Tests: No results for input(s): "TSH", "T4TOTAL", "FREET4", "T3FREE", "THYROIDAB" in  the last 72 hours. Anemia Panel: No results for input(s): "VITAMINB12", "FOLATE", "FERRITIN", "TIBC", "IRON", "RETICCTPCT" in the last 72 hours. Sepsis Labs: Recent Labs  Lab 05/27/23 0305  LATICACIDVEN 1.2    Recent Results (from the past 240 hour(s))  MRSA Next Gen by PCR, Nasal     Status: None   Collection Time: 05/22/23  4:40 AM   Specimen: Nasal Mucosa; Nasal Swab  Result Value Ref Range Status   MRSA by PCR Next Gen NOT DETECTED NOT DETECTED Final    Comment: (NOTE) The GeneXpert MRSA Assay (FDA approved for NASAL specimens only), is one component of a comprehensive MRSA colonization surveillance program. It is not intended to diagnose MRSA infection nor to guide or monitor treatment for MRSA infections. Test performance is not FDA approved in patients less than 43 years old. Performed at [facility], [address] [place], [place] [address]     Radiology Studies: No results found.  Scheduled Meds:  (feeding supplement) PROSource Plus  30 mL Oral BID  BM   allopurinol  100 mg Oral q AM   amiodarone  200 mg Oral Daily   apixaban  2.5 mg Oral BID   atorvastatin  40 mg Oral QHS   calcitRIOL  1.25 mcg Oral Q M,W,F-HD   Chlorhexidine Gluconate Cloth  6 each Topical Q0600   gabapentin  300 mg Oral QHS   insulin aspart  0-6 Units Subcutaneous TID AC & HS   insulin aspart  2 Units Subcutaneous TID WC   insulin NPH Human  10 Units Subcutaneous QAC breakfast   midodrine  10 mg Oral TID WC   sodium chloride flush  3 mL Intravenous Q12H   tamsulosin  0.4 mg Oral QHS   Continuous Infusions:    LOS: 10 days    Time spent: 35 mins    Willeen Niece, MD [facility]   If 7PM-7AM, please contact night-coverage

## 2023-05-30 DIAGNOSIS — I953 Hypotension of hemodialysis: Secondary | ICD-10-CM | POA: Diagnosis not present

## 2023-05-30 DIAGNOSIS — A419 Sepsis, unspecified organism: Secondary | ICD-10-CM | POA: Diagnosis not present

## 2023-05-30 DIAGNOSIS — I729 Aneurysm of unspecified site: Secondary | ICD-10-CM | POA: Diagnosis not present

## 2023-05-30 DIAGNOSIS — I5043 Acute on chronic combined systolic (congestive) and diastolic (congestive) heart failure: Secondary | ICD-10-CM | POA: Diagnosis not present

## 2023-05-30 DIAGNOSIS — R269 Unspecified abnormalities of gait and mobility: Secondary | ICD-10-CM | POA: Diagnosis not present

## 2023-05-30 DIAGNOSIS — J189 Pneumonia, unspecified organism: Secondary | ICD-10-CM | POA: Diagnosis present

## 2023-05-30 DIAGNOSIS — E871 Hypo-osmolality and hyponatremia: Secondary | ICD-10-CM | POA: Diagnosis not present

## 2023-05-30 DIAGNOSIS — I4891 Unspecified atrial fibrillation: Secondary | ICD-10-CM | POA: Diagnosis not present

## 2023-05-30 DIAGNOSIS — J9601 Acute respiratory failure with hypoxia: Secondary | ICD-10-CM | POA: Diagnosis not present

## 2023-05-30 DIAGNOSIS — D631 Anemia in chronic kidney disease: Secondary | ICD-10-CM | POA: Diagnosis not present

## 2023-05-30 DIAGNOSIS — L8995 Pressure ulcer of unspecified site, unstageable: Secondary | ICD-10-CM | POA: Diagnosis not present

## 2023-05-30 DIAGNOSIS — Y95 Nosocomial condition: Secondary | ICD-10-CM | POA: Diagnosis present

## 2023-05-30 DIAGNOSIS — I10 Essential (primary) hypertension: Secondary | ICD-10-CM | POA: Diagnosis not present

## 2023-05-30 DIAGNOSIS — I700 Atherosclerosis of aorta: Secondary | ICD-10-CM | POA: Diagnosis not present

## 2023-05-30 DIAGNOSIS — M6281 Muscle weakness (generalized): Secondary | ICD-10-CM | POA: Diagnosis not present

## 2023-05-30 DIAGNOSIS — I482 Chronic atrial fibrillation, unspecified: Secondary | ICD-10-CM | POA: Diagnosis not present

## 2023-05-30 DIAGNOSIS — E119 Type 2 diabetes mellitus without complications: Secondary | ICD-10-CM | POA: Diagnosis not present

## 2023-05-30 DIAGNOSIS — K760 Fatty (change of) liver, not elsewhere classified: Secondary | ICD-10-CM | POA: Insufficient documentation

## 2023-05-30 DIAGNOSIS — D61818 Other pancytopenia: Secondary | ICD-10-CM | POA: Diagnosis not present

## 2023-05-30 DIAGNOSIS — E785 Hyperlipidemia, unspecified: Secondary | ICD-10-CM | POA: Diagnosis not present

## 2023-05-30 DIAGNOSIS — R918 Other nonspecific abnormal finding of lung field: Secondary | ICD-10-CM | POA: Diagnosis not present

## 2023-05-30 DIAGNOSIS — Z6830 Body mass index (BMI) 30.0-30.9, adult: Secondary | ICD-10-CM | POA: Diagnosis not present

## 2023-05-30 DIAGNOSIS — L8990 Pressure ulcer of unspecified site, unspecified stage: Secondary | ICD-10-CM | POA: Diagnosis not present

## 2023-05-30 DIAGNOSIS — E878 Other disorders of electrolyte and fluid balance, not elsewhere classified: Secondary | ICD-10-CM | POA: Diagnosis not present

## 2023-05-30 DIAGNOSIS — R509 Fever, unspecified: Secondary | ICD-10-CM | POA: Diagnosis not present

## 2023-05-30 DIAGNOSIS — E876 Hypokalemia: Secondary | ICD-10-CM | POA: Diagnosis not present

## 2023-05-30 DIAGNOSIS — M109 Gout, unspecified: Secondary | ICD-10-CM | POA: Diagnosis not present

## 2023-05-30 DIAGNOSIS — T82318A Breakdown (mechanical) of other vascular grafts, initial encounter: Secondary | ICD-10-CM | POA: Diagnosis not present

## 2023-05-30 DIAGNOSIS — R6521 Severe sepsis with septic shock: Secondary | ICD-10-CM | POA: Diagnosis not present

## 2023-05-30 DIAGNOSIS — T82590A Other mechanical complication of surgically created arteriovenous fistula, initial encounter: Secondary | ICD-10-CM | POA: Diagnosis not present

## 2023-05-30 DIAGNOSIS — A412 Sepsis due to unspecified staphylococcus: Secondary | ICD-10-CM | POA: Diagnosis not present

## 2023-05-30 DIAGNOSIS — R7881 Bacteremia: Secondary | ICD-10-CM | POA: Diagnosis not present

## 2023-05-30 DIAGNOSIS — J9602 Acute respiratory failure with hypercapnia: Secondary | ICD-10-CM | POA: Diagnosis not present

## 2023-05-30 DIAGNOSIS — N186 End stage renal disease: Secondary | ICD-10-CM | POA: Diagnosis not present

## 2023-05-30 DIAGNOSIS — R262 Difficulty in walking, not elsewhere classified: Secondary | ICD-10-CM | POA: Diagnosis not present

## 2023-05-30 DIAGNOSIS — R1311 Dysphagia, oral phase: Secondary | ICD-10-CM | POA: Diagnosis not present

## 2023-05-30 DIAGNOSIS — Z794 Long term (current) use of insulin: Secondary | ICD-10-CM | POA: Diagnosis not present

## 2023-05-30 DIAGNOSIS — Z66 Do not resuscitate: Secondary | ICD-10-CM | POA: Diagnosis not present

## 2023-05-30 DIAGNOSIS — Z515 Encounter for palliative care: Secondary | ICD-10-CM | POA: Diagnosis not present

## 2023-05-30 DIAGNOSIS — M51369 Other intervertebral disc degeneration, lumbar region without mention of lumbar back pain or lower extremity pain: Secondary | ICD-10-CM | POA: Diagnosis not present

## 2023-05-30 DIAGNOSIS — G9340 Encephalopathy, unspecified: Secondary | ICD-10-CM | POA: Diagnosis not present

## 2023-05-30 DIAGNOSIS — R000 Tachycardia, unspecified: Secondary | ICD-10-CM | POA: Diagnosis not present

## 2023-05-30 DIAGNOSIS — Z741 Need for assistance with personal care: Secondary | ICD-10-CM | POA: Diagnosis not present

## 2023-05-30 DIAGNOSIS — E1122 Type 2 diabetes mellitus with diabetic chronic kidney disease: Secondary | ICD-10-CM | POA: Diagnosis not present

## 2023-05-30 DIAGNOSIS — I5020 Unspecified systolic (congestive) heart failure: Secondary | ICD-10-CM | POA: Diagnosis not present

## 2023-05-30 DIAGNOSIS — I871 Compression of vein: Secondary | ICD-10-CM | POA: Diagnosis not present

## 2023-05-30 DIAGNOSIS — I5022 Chronic systolic (congestive) heart failure: Secondary | ICD-10-CM | POA: Diagnosis not present

## 2023-05-30 DIAGNOSIS — I959 Hypotension, unspecified: Secondary | ICD-10-CM | POA: Diagnosis not present

## 2023-05-30 DIAGNOSIS — I517 Cardiomegaly: Secondary | ICD-10-CM | POA: Diagnosis not present

## 2023-05-30 DIAGNOSIS — I252 Old myocardial infarction: Secondary | ICD-10-CM | POA: Diagnosis not present

## 2023-05-30 DIAGNOSIS — L97529 Non-pressure chronic ulcer of other part of left foot with unspecified severity: Secondary | ICD-10-CM | POA: Diagnosis not present

## 2023-05-30 DIAGNOSIS — D689 Coagulation defect, unspecified: Secondary | ICD-10-CM | POA: Diagnosis not present

## 2023-05-30 DIAGNOSIS — E1165 Type 2 diabetes mellitus with hyperglycemia: Secondary | ICD-10-CM | POA: Diagnosis not present

## 2023-05-30 DIAGNOSIS — I6782 Cerebral ischemia: Secondary | ICD-10-CM | POA: Diagnosis not present

## 2023-05-30 DIAGNOSIS — G9341 Metabolic encephalopathy: Secondary | ICD-10-CM | POA: Diagnosis not present

## 2023-05-30 DIAGNOSIS — Z7401 Bed confinement status: Secondary | ICD-10-CM | POA: Diagnosis not present

## 2023-05-30 DIAGNOSIS — T82898A Other specified complication of vascular prosthetic devices, implants and grafts, initial encounter: Secondary | ICD-10-CM | POA: Diagnosis not present

## 2023-05-30 DIAGNOSIS — Z9181 History of falling: Secondary | ICD-10-CM | POA: Diagnosis not present

## 2023-05-30 DIAGNOSIS — Z992 Dependence on renal dialysis: Secondary | ICD-10-CM | POA: Diagnosis not present

## 2023-05-30 DIAGNOSIS — L03114 Cellulitis of left upper limb: Secondary | ICD-10-CM | POA: Diagnosis not present

## 2023-05-30 DIAGNOSIS — G8190 Hemiplegia, unspecified affecting unspecified side: Secondary | ICD-10-CM | POA: Diagnosis not present

## 2023-05-30 DIAGNOSIS — R531 Weakness: Secondary | ICD-10-CM | POA: Diagnosis not present

## 2023-05-30 DIAGNOSIS — I132 Hypertensive heart and chronic kidney disease with heart failure and with stage 5 chronic kidney disease, or end stage renal disease: Secondary | ICD-10-CM | POA: Diagnosis not present

## 2023-05-30 DIAGNOSIS — I499 Cardiac arrhythmia, unspecified: Secondary | ICD-10-CM | POA: Diagnosis not present

## 2023-05-30 DIAGNOSIS — T82858A Stenosis of vascular prosthetic devices, implants and grafts, initial encounter: Secondary | ICD-10-CM | POA: Diagnosis not present

## 2023-05-30 DIAGNOSIS — E669 Obesity, unspecified: Secondary | ICD-10-CM | POA: Diagnosis present

## 2023-05-30 LAB — PHOSPHORUS: Phosphorus: 3.9 mg/dL (ref 2.5–4.6)

## 2023-05-30 LAB — BASIC METABOLIC PANEL
Anion gap: 12 (ref 5–15)
BUN: 60 mg/dL — ABNORMAL HIGH (ref 8–23)
CO2: 23 mmol/L (ref 22–32)
Calcium: 8.5 mg/dL — ABNORMAL LOW (ref 8.9–10.3)
Chloride: 89 mmol/L — ABNORMAL LOW (ref 98–111)
Creatinine, Ser: 4.25 mg/dL — ABNORMAL HIGH (ref 0.61–1.24)
GFR, Estimated: 13 mL/min — ABNORMAL LOW (ref >60)
Glucose, Bld: 145 mg/dL — ABNORMAL HIGH (ref 70–99)
Potassium: 3.7 mmol/L (ref 3.5–5.1)
Sodium: 124 mmol/L — ABNORMAL LOW (ref 135–145)

## 2023-05-30 LAB — CBC
HCT: 29.5 % — ABNORMAL LOW (ref 39.0–52.0)
Hemoglobin: 10.3 g/dL — ABNORMAL LOW (ref 13.0–17.0)
MCH: 34.3 pg — ABNORMAL HIGH (ref 26.0–34.0)
MCHC: 34.9 g/dL (ref 30.0–36.0)
MCV: 98.3 fL (ref 80.0–100.0)
Platelets: 96 10*3/uL — ABNORMAL LOW (ref 150–400)
RBC: 3 MIL/uL — ABNORMAL LOW (ref 4.22–5.81)
RDW: 17.2 % — ABNORMAL HIGH (ref 11.5–15.5)
WBC: 4.9 10*3/uL (ref 4.0–10.5)
nRBC: 0 % (ref 0.0–0.2)

## 2023-05-30 LAB — GLUCOSE, CAPILLARY
Glucose-Capillary: 151 mg/dL — ABNORMAL HIGH (ref 70–99)
Glucose-Capillary: 107 mg/dL — ABNORMAL HIGH (ref 70–99)

## 2023-05-30 LAB — MAGNESIUM: Magnesium: 1.8 mg/dL (ref 1.7–2.4)

## 2023-05-30 MED ORDER — MIDODRINE HCL 10 MG PO TABS: 10.0000 mg | ORAL_TABLET | Freq: Three times a day (TID) | ORAL | 0 refills | Status: DC

## 2023-05-30 MED ORDER — AMIODARONE HCL 200 MG PO TABS: 200.0000 mg | ORAL_TABLET | Freq: Every day | ORAL | 0 refills | Status: DC

## 2023-05-30 MED ORDER — APIXABAN 2.5 MG PO TABS: 2.5000 mg | ORAL_TABLET | Freq: Two times a day (BID) | ORAL | 0 refills | Status: DC

## 2023-05-30 NOTE — TOC Transition Note (Signed)
Transition of Care [facility]) - CM/SW Discharge Note   Patient Details  Name: Robert Lucas MRN: [id number] Date of Birth: 05-Sep-1940  Transition of Care [facility]) CM/SW Contact:  Michaela Corner, LCSWA Phone Number: 05/30/2023, 10:46 AM   Clinical Narrative:    Patient will DC to: [facility] Anticipated DC date: 05/30/2023 Family notified: Lorelle Formosa (spouse), Dois Davenport (dtr) Transport by: Sharin Mons   Per MD patient ready for DC to [facility]. RN to call report prior to discharge [phone]). RN, patient, patient's family, and facility notified of DC. Discharge Summary and FL2 sent to facility. DC packet on chart. Ambulance transport requested for patient.   CSW will sign off for now as social work intervention is no longer needed. Please consult [place] again if new needs arise.      Final next level of care: Skilled Nursing Facility Barriers to Discharge: Barriers Resolved   Patient Goals and CMS Choice CMS Medicare.gov Compare Post Acute Care list provided to:: Patient Choice offered to / list presented to : Patient  Discharge Placement                Patient chooses bed at: Other - please specify in the comment section below: [facility]) Patient to be transferred to facility by: [facility] Name of family member notified: Lorelle Formosa - spouse, Dois Davenport - dtr Patient and family notified of of transfer: 05/30/23  Discharge Plan and Services Additional resources added to the After Visit Summary for   In-house Referral: NA Discharge Planning Services: CM Consult Post Acute Care Choice: NA          DME Arranged: N/A DME Agency: NA       HH Arranged: PT, OT, Patient Refused HH          Social Determinants of Health (SDOH) Interventions SDOH Screenings   Food Insecurity: No Food Insecurity (05/19/2023)  Housing: Low Risk  (05/19/2023)  Transportation Needs: No Transportation Needs (05/19/2023)  Utilities: Not At Risk (05/19/2023)  Tobacco Use:  Medium Risk (05/19/2023)     Readmission Risk Interventions    05/21/2023    4:06 PM 12/20/2022    1:38 PM  Readmission Risk Prevention Plan  Transportation Screening Complete Complete  PCP or Specialist Appt within 5-7 Days  Complete  PCP or Specialist Appt within 3-5 Days Complete   Home Care Screening  Complete  Medication Review (RN CM)  Complete  HRI or Home Care Consult Complete   Palliative Care Screening Not Applicable   Medication Review (RN Care Manager) Complete

## 2023-05-30 NOTE — Progress Notes (Signed)
[facility] Progress Note   Subjective: patient seen and examined bedside. Tolerated HD overnight. Net uf seems to be around 2.5L based on flowsheet. Feels better, pending d/c today Overall, he reports feeling better. Has already contemplated home therapies in the past but not feasible.  Objective Vitals:   05/29/23 2330 05/29/23 2345 05/30/23 0423 05/30/23 0805  BP: (!) 122/57 (!) 140/66 (!) 139/57 (!) 127/57  Pulse: 76 (!) 107 87 87  Resp: 15 (!) 21 20 (!) 22  Temp:  97.9 F (36.6 C) 97.7 F (36.5 C) 98.5 F (36.9 C)  TempSrc:  Oral Oral Oral  SpO2: 100% 100% 100% 98%  Weight:   75.8 kg   Height:       Physical Exam General: Chronically ill appearing male in NAD, sitting up in bed Heart: irreg, irreg No M/R/G Lungs: decreased in bases. Normal wob/unlabored Abdomen: obese, NABS Extremities: trace-1+ BLE edema Neuro: awake, following commands Dialysis Access: AVG + T/B  Additional Objective Labs: Basic Metabolic Panel: Recent Labs  Lab 05/28/23 0257 05/28/23 1328 05/29/23 0316 05/30/23 0706  NA 118* 125* 123* 124*  K 3.7 3.6 3.8 3.7  CL 86* 89* 88* 89*  CO2 22 24 23 23   GLUCOSE 125* 223* 185* 145*  BUN 71* 37* 52* 60*  CREATININE 4.41* 2.89* 3.48* 4.25*  CALCIUM 8.1* 8.2* 8.2* 8.5*  PHOS 5.0* 3.3  --  3.9   Liver Function Tests: Recent Labs  Lab 05/28/23 1328  ALBUMIN 2.3*   No results for input(s): "LIPASE", "AMYLASE" in the last 168 hours. CBC: Recent Labs  Lab 05/26/23 1147 05/27/23 0305 05/27/23 2047 05/28/23 0257 05/29/23 0316 05/30/23 0706  WBC 4.7 6.2  --  6.0 5.2 4.9  HGB 10.9* 10.4*   < > 10.5* 11.2* 10.3*  HCT 31.4* 30.0*   < > 29.4* 32.2* 29.5*  MCV 96.9 97.7  --  98.0 98.8 98.3  PLT 61* 65*  --  78* 88* 96*   < > = values in this interval not displayed.   Blood Culture    Component Value Date/Time   SDES BLOOD RIGHT HAND 12/18/2022 0716   SPECREQUEST  12/18/2022 0716    BOTTLES DRAWN AEROBIC ONLY Blood Culture  results may not be optimal due to an inadequate volume of blood received in culture bottles   CULT  12/18/2022 0716    NO GROWTH 5 DAYS Performed at [facility], [address], [place], [place] [address]    REPTSTATUS 12/23/2022 FINAL 12/18/2022 0716    Cardiac Enzymes: No results for input(s): "CKTOTAL", "CKMB", "CKMBINDEX", "TROPONINI" in the last 168 hours. CBG: Recent Labs  Lab 05/28/23 2109 05/29/23 0608 05/29/23 1127 05/29/23 1548 05/30/23 0621  GLUCAP 203* 209* 214* 130* 151*   Iron Studies: No results for input(s): "IRON", "TIBC", "TRANSFERRIN", "FERRITIN" in the last 72 hours. @lablastinr3 @ Studies/Results: No results found. Medications:   (feeding supplement) PROSource Plus  30 mL Oral BID BM   allopurinol  100 mg Oral q AM   amiodarone  200 mg Oral Daily   apixaban  2.5 mg Oral BID   atorvastatin  40 mg Oral QHS   calcitRIOL  1.25 mcg Oral Q M,W,F-HD   Chlorhexidine Gluconate Cloth  6 each Topical Q0600   gabapentin  300 mg Oral QHS   insulin aspart  0-6 Units Subcutaneous TID AC & HS   insulin aspart  2 Units Subcutaneous TID WC   insulin NPH Human  10 Units Subcutaneous QAC  breakfast   midodrine  10 mg Oral TID WC   sodium chloride flush  3 mL Intravenous Q12H   tamsulosin  0.4 mg Oral QHS     Dialysis Orders: NW MWF 400/auto 1.5 3K/2.5Ca EDW 68.1 4h -Heparin 2500 units IV three times per week  - Calcitriol 1.25 mcg PO  three times per week No ESA   Assessment/Plan:  A fib with RVR complicated by hypotension:  cardiology consulting - on amio now. TS/p TEE. No thrombus. CTA neg PE.     2. ESRD on HD MWF: MWF. Had serial HD 11/18-11/20 due to volume overload. He still has edema and hyponatremia, but improving. We recommend HD 4 days a week. Can resume HD at his facility once discharged.    3. HFrEF with evidence of volume overload. EF 30-35% Cards following. EF 20% by TEE. Continue to titrate volume down as tolerated. He will need dialysis 4  days a week on discharge--outpatient HD plan will be Mon, Tues, Wed, Fri at [place] onsite-HD.    4. HTN: dx HTN at baseline, now with hypotension in setting of a fib, hold meds. On midodrine 10 mg PO TID. Continue to attempt to optimize volume with HD.    5. Anemia of CKD:  Hb 10.3. no ESA indicated at this time.    6. BMM: Calcium was a bit low. Resumed his PO calcitriol. Phosphorus is at goal   7. Nutrition: Very low albumin. Continue protein supplements  8. Hyponatremia, hypervolemic: chronic. will cont to attempt to manage with HD/UF. Na does improve post HD  Okay for discharge    Robert Sar, MD [facility]

## 2023-05-30 NOTE — Consult Note (Signed)
[facility] Liaison Consult Note    05/30/2023  Robert Lucas April 19, 1941 [id number]  Primary Care Provider:  Joycelyn Rua, MD, is an [place] provider, this provider team follows for post hospital transition of care  Insurance: [facility]   Patient was reviewed for readmission with extreme high risk score 11 day length of stay community barriers to care. Patient was discussed in unit progression for possible transition to SNF today, and was asleep on unit rounds, no family currently at bedside, did not awaken.  Patient was screened for hospitalization and on behalf of [facility]  Care Coordination to assess for post hospital community care needs.  Patient is being considered for a skilled nursing facility level of care for post hospital transition.   Plan:  If transitions to affiliated facility, then will notify the Community [facility] RN can follow for any known or needs for transitional care needs before returning to post facility care coordination needs to return to communit, if applicable.  Review of patient's electronic medical record reveals patient is currently for [facility] when medically ready with HD through [facility].   [facility], Population Health does not replace or interfere with any arrangements made by the Inpatient Transition of Care team.   For questions contact:   Charlesetta Shanks, RN, BSN, CCM [place]  [facility], [facility] Liaison Direct Dial: [phone] or secure chat Email: [email or web address]

## 2023-05-30 NOTE — Discharge Summary (Signed)
Physician Discharge Summary  Robert Lucas:[id number] DOB: 12-22-1940 DOA: 05/18/2023  PCP: Joycelyn Rua, MD  Admit date: 05/18/2023 Discharge date: 05/30/2023  Admitted From: Home Disposition:  SNF  Recommendations for Outpatient Follow-up:  Follow up with PCP  Follow up with Cardiology, Dr. Rosemary Holms as scheduled 12/4 Dialysis recommended 4 days a week, Monday, Tuesday, Wednesday, Friday  Discharge Condition: Stable CODE STATUS: Full  Diet recommendation: Renal   Brief/Interim Summary: Robert Lucas is a 82 y.o. male with medical history significant of ESRD on HD MWF ,  DM2, systolic/diastolic heart failure ef 30-35%, HLD, anemia, CAD, HTN, hx of MI, gout, who presented on 05/18/2023 with hypotension, A-fib RVR.  He was noted to have hypotension with SBP in the 60s at dialysis, and tachycardic.  Also was found to be hypoxic with SpO2 in the 70s, requiring 4 L nasal cannula O2.  Patient was admitted with diagnosis of A-fib RVR, hypotension, respiratory failure.  During hospitalization, cardiology, nephrology were consulted.  He was treated with IV amiodarone, IV heparin which was subsequently transitioned to Eliquis.  Underwent TEE and cardioversion on 11/21 with successful cardioversion.  Fluid was managed with dialysis, blood pressure treated with midodrine.  Discharge Diagnoses:   Principal Problem:   Atrial fibrillation with RVR (HCC) Active Problems:   DM2 (diabetes mellitus, type 2) (HCC)   Essential hypertension   Hyperlipidemia   Coronary artery disease involving native coronary artery of native heart without angina pectoris   Obesity (BMI 30-39.9)   BPH (benign prostatic hyperplasia)   Chronic combined systolic and diastolic CHF (congestive heart failure) (HCC)   Fluid overload   ESRD (end stage renal disease) (HCC)   Acute respiratory failure with hypoxia (HCC)   Hyponatremia   Hypotension   Hepatic steatosis   Discharge Instructions  Discharge  Instructions     Discharge wound care:   Complete by: As directed    cleanse left great toe with VASHE.  VASHE moist 2x2 to wound bed. Wrap with kerlix and tape.  Change daily.   Increase activity slowly   Complete by: As directed       Allergies as of 05/30/2023       Reactions   Zestril [lisinopril] Cough   Hydrocodone Nausea Only   Januvia [sitagliptin] Other (See Comments)   Unknown   Levitra [vardenafil] Other (See Comments)   flushing   Prednisone Other (See Comments)   Elevates BGL; patient prefers to not take this   Victoza [liraglutide] Other (See Comments)   GI upset   Penicillins Rash, Other (See Comments)   Rash around ankles Has patient had a PCN reaction causing immediate rash, facial/tongue/throat swelling, SOB or lightheadedness with hypotension: Yes Has patient had a PCN reaction causing severe rash involving mucus membranes or skin necrosis: Unk Has patient had a PCN reaction that required hospitalization: No Has patient had a PCN reaction occurring within the last 10 years: No If all of the above answers are "NO", then may proceed with Cephalosporin use.        Medication List     STOP taking these medications    aspirin EC 81 MG tablet   finasteride 5 MG tablet Commonly known as: PROSCAR   gabapentin 300 MG capsule Commonly known as: NEURONTIN   metoprolol succinate 50 MG 24 hr tablet Commonly known as: TOPROL-XL   predniSONE 10 MG tablet Commonly known as: DELTASONE       TAKE these medications    acetaminophen 650 MG  CR tablet Commonly known as: TYLENOL Take 1,300 mg by mouth in the morning, at noon, and at bedtime.   allopurinol 100 MG tablet Commonly known as: ZYLOPRIM Take 100 mg by mouth in the morning.   amiodarone 200 MG tablet Commonly known as: PACERONE Take 1 tablet (200 mg total) by mouth daily. Start taking on: May 31, 2023   apixaban 2.5 MG Tabs tablet Commonly known as: ELIQUIS Take 1 tablet (2.5 mg  total) by mouth 2 (two) times daily.   atorvastatin 40 MG tablet Commonly known as: LIPITOR Take 40 mg by mouth at bedtime.   midodrine 10 MG tablet Commonly known as: PROAMATINE Take 1 tablet (10 mg total) by mouth 3 (three) times daily with meals.   NovoLOG Mix 70/30 FlexPen (70-30) 100 UNIT/ML FlexPen Generic drug: insulin aspart protamine - aspart Inject 14-20 Units into the skin daily as needed (high blood sugar). If BS is <150=0 units, If BS is 150-200 units=14 units, If BS is >201=20 units   tamsulosin 0.4 MG Caps capsule Commonly known as: FLOMAX Take 1 capsule (0.4 mg total) by mouth daily after supper. What changed: when to take this               Discharge Care Instructions  (From admission, onward)           Start     Ordered   05/30/23 0000  Discharge wound care:       Comments: cleanse left great toe with VASHE.  VASHE moist 2x2 to wound bed. Wrap with kerlix and tape.  Change daily.   05/30/23 1034            Follow-up Information     Joycelyn Rua, MD Follow up.   Specialty: Family Medicine Contact information: [address] [place] [address] [phone]         Elder Negus, MD Follow up.   Specialties: Cardiology, Radiology Contact information: [address] [place] [address] [phone]                Allergies  Allergen Reactions   Zestril [Lisinopril] Cough   Hydrocodone Nausea Only   Januvia [Sitagliptin] Other (See Comments)    Unknown   Levitra [Vardenafil] Other (See Comments)    flushing   Prednisone Other (See Comments)    Elevates BGL; patient prefers to not take this   Victoza [Liraglutide] Other (See Comments)    GI upset   Penicillins Rash and Other (See Comments)    Rash around ankles Has patient had a PCN reaction causing immediate rash, facial/tongue/throat swelling, SOB or lightheadedness with hypotension: Yes Has patient had a PCN reaction causing  severe rash involving mucus membranes or skin necrosis: Unk Has patient had a PCN reaction that required hospitalization: No Has patient had a PCN reaction occurring within the last 10 years: No If all of the above answers are "NO", then may proceed with Cephalosporin use.     Consultations: Cardiology Nephrology   Procedures/Studies: ECHO TEE  Result Date: 05/24/2023    TRANSESOPHOGEAL ECHO REPORT   Patient Name:   YEHIA SESTER Date of Exam: 05/24/2023 Medical Rec #:  [id number]       Height:       62.0 in Accession #:    [id number]      Weight:       166.7 lb Date of Birth:  1940-12-20      BSA:  1.769 m Patient Age:    82 years        BP:           73/56 mmHg Patient Gender: M               HR:           93 bpm. Exam Location:  Inpatient Procedure: Transesophageal Echo, Cardiac Doppler and Color Doppler Indications:    Atrial fibrillation  History:        Patient has prior history of Echocardiogram examinations, most                 recent 05/22/2023. CHF, Previous Myocardial Infarction and CAD;                 Risk Factors:Hypertension and Diabetes. ESRD on HD.  Sonographer:    Milda Smart Referring Phys: [id number] [name] PROCEDURE: After discussion of the risks and benefits of a TEE, an informed consent was obtained from the patient. TEE procedure time was 7 minutes. The transesophogeal probe was passed without difficulty through the esophogus of the patient. Imaged were  obtained with the patient in a left lateral decubitus position. Sedation performed by different physician. The patient was monitored while under deep sedation. Anesthestetic sedation was provided intravenously by Anesthesiology: 50mg  of Propofol, 30mg  of Lidocaine. Image quality was good. The patient's vital signs; including heart rate, blood pressure, and oxygen saturation; remained stable throughout the procedure. The patient developed no complications during the procedure. A successful direct current  cardioversion was performed at 200/250 joules with 2 attempts.  IMPRESSIONS  1. Left ventricular ejection fraction, by estimation, is <20%. The left ventricle has severely decreased function. The left ventricle demonstrates global hypokinesis. The left ventricular internal cavity size was mildly dilated.  2. Right ventricular systolic function is moderately reduced. The right ventricular size is mildly enlarged. There is normal pulmonary artery systolic pressure.  3. Left atrial size was mildly dilated. No left atrial/left atrial appendage thrombus was detected.  4. The mitral valve is normal in structure. Mild mitral valve regurgitation. Severe mitral annular calcification.  5. Tricuspid valve regurgitation is severe.  6. The aortic valve is tricuspid. There is moderate calcification of the aortic valve. There is moderate thickening of the aortic valve. Aortic valve regurgitation is not visualized. FINDINGS  Left Ventricle: Left ventricular ejection fraction, by estimation, is <20%. The left ventricle has severely decreased function. The left ventricle demonstrates global hypokinesis. The left ventricular internal cavity size was mildly dilated. Right Ventricle: The right ventricular size is mildly enlarged. No increase in right ventricular wall thickness. Right ventricular systolic function is moderately reduced. There is normal pulmonary artery systolic pressure. The tricuspid regurgitant velocity is 2.79 m/s, and with an assumed right atrial pressure of 3 mmHg, the estimated right ventricular systolic pressure is 34.1 mmHg. Left Atrium: Left atrial size was mildly dilated. No left atrial/left atrial appendage thrombus was detected. Right Atrium: Right atrial size was normal in size. Pericardium: There is no evidence of pericardial effusion. Mitral Valve: The mitral valve is normal in structure. Severe mitral annular calcification. Mild mitral valve regurgitation. Tricuspid Valve: The tricuspid valve is normal in  structure. Tricuspid valve regurgitation is severe. Aortic Valve: The aortic valve is tricuspid. There is moderate calcification of the aortic valve. There is moderate thickening of the aortic valve. Aortic valve regurgitation is not visualized. Pulmonic Valve: The pulmonic valve was normal in structure. Pulmonic valve regurgitation is not visualized. Aorta:  The aortic root is normal in size and structure. IAS/Shunts: No atrial level shunt detected by color flow Doppler. Additional Comments: Spectral Doppler performed. TRICUSPID VALVE TR Peak grad:   31.1 mmHg TR Vmax:        279.00 cm/s Donato Schultz MD Electronically signed by Donato Schultz MD Signature Date/Time: 05/24/2023/4:05:56 PM    Final    EP STUDY  Result Date: 05/24/2023 See surgical note for result.  ECHOCARDIOGRAM COMPLETE  Result Date: 05/22/2023    ECHOCARDIOGRAM REPORT   Patient Name:   CONO LIVENGOOD Date of Exam: 05/22/2023 Medical Rec #:  [id number]       Height:       62.0 in Accession #:    [id number]      Weight:       173.9 lb Date of Birth:  26-Jan-1941      BSA:          1.802 m Patient Age:    82 years        BP:           105/93 mmHg Patient Gender: M               HR:           118 bpm. Exam Location:  Inpatient Procedure: 2D Echo, Cardiac Doppler, Color Doppler and Intracardiac            Opacification Agent Indications:    Atrial Fibrillation I48.91  History:        Patient has prior history of Echocardiogram examinations, most                 recent 01/29/2022. CHF, CAD and Previous Myocardial Infarction,                 ESRD, Arrythmias:Atrial Fibrillation and Tachycardia,                 Signs/Symptoms:Hypotension and Chest Pain; Risk                 Factors:Hypertension, Sleep Apnea, Diabetes and Dyslipidemia.  Sonographer:    Lucendia Herrlich RCS Referring Phys: Therisa Doyne IMPRESSIONS  1. Left ventricular ejection fraction, by estimation, is 25 to 30%. The left ventricle has severely decreased function. The left  ventricle demonstrates regional wall motion abnormalities (see scoring diagram/findings for description). The left ventricular internal cavity size was moderately dilated. Left ventricular diastolic function could not be evaluated. There is akinesis of the left ventricular, entire apical segment. There is akinesis of the left ventricular, apical septal wall, anterior  wall, inferior wall and inferolateral wall.  2. Right ventricular systolic function is moderately reduced. The right ventricular size is mildly enlarged. There is moderately elevated pulmonary artery systolic pressure. The estimated right ventricular systolic pressure is 52.7 mmHg.  3. Left atrial size was moderately dilated.  4. Right atrial size was severely dilated.  5. The mitral valve is degenerative. Trivial mitral valve regurgitation. No evidence of mitral stenosis. Moderate mitral annular calcification.  6. Tricuspid valve regurgitation is mild to moderate.  7. The aortic valve is calcified. There is moderate calcification of the aortic valve. There is moderate thickening of the aortic valve. The left coronary cusp is fixed and immobile..     Aortic valve regurgitation is not visualized. No aortic stenosis is present. Aortic valve mean gradient measures 6.0 mmHg. Aortic valve Vmax measures 1.65 m/s. Although mean AVG is low, the DVI is 0.4 and SVI low at 13 indicating  at least a mild to moderate degree of low flow low gradient AS. The AVA is underestimated due to small LVOT diameter measurement.  8. The inferior vena cava is dilated in size with <50% respiratory variability, suggesting right atrial pressure of 15 mmHg. FINDINGS  Left Ventricle: Left ventricular ejection fraction, by estimation, is 25 to 30%. The left ventricle has severely decreased function. The left ventricle demonstrates regional wall motion abnormalities. Definity contrast agent was given IV to delineate the left ventricular endocardial borders. The left ventricular  internal cavity size was moderately dilated. There is no left ventricular hypertrophy. Left ventricular diastolic function could not be evaluated due to atrial fibrillation. Left ventricular diastolic function could not be evaluated. Right Ventricle: The right ventricular size is mildly enlarged. No increase in right ventricular wall thickness. Right ventricular systolic function is moderately reduced. There is moderately elevated pulmonary artery systolic pressure. The tricuspid regurgitant velocity is 3.07 m/s, and with an assumed right atrial pressure of 15 mmHg, the estimated right ventricular systolic pressure is 52.7 mmHg. Left Atrium: Left atrial size was moderately dilated. Right Atrium: Right atrial size was severely dilated. Pericardium: There is no evidence of pericardial effusion. Mitral Valve: The mitral valve is degenerative in appearance. There is mild thickening of the mitral valve leaflet(s). There is mild calcification of the mitral valve leaflet(s). Moderate mitral annular calcification. Trivial mitral valve regurgitation. No evidence of mitral valve stenosis. Tricuspid Valve: The tricuspid valve is normal in structure. Tricuspid valve regurgitation is mild to moderate. No evidence of tricuspid stenosis. Aortic Valve: The left coronary cusp is fixed and immobile. The aortic valve is calcified. There is moderate calcification of the aortic valve. There is moderate thickening of the aortic valve. Aortic valve regurgitation is not visualized. Mild aortic stenosis is present. Aortic valve mean gradient measures 6.0 mmHg. Aortic valve peak gradient measures 10.9 mmHg. Aortic valve area, by VTI measures 0.92 cm. Pulmonic Valve: The pulmonic valve was normal in structure. Pulmonic valve regurgitation is trivial. No evidence of pulmonic stenosis. Aorta: The aortic root is normal in size and structure. Venous: The inferior vena cava is dilated in size with less than 50% respiratory variability, suggesting  right atrial pressure of 15 mmHg. IAS/Shunts: No atrial level shunt detected by color flow Doppler.  LEFT VENTRICLE PLAX 2D LVIDd:         6.30 cm   Diastology LVIDs:         5.60 cm   LV e' medial:    5.27 cm/s LV PW:         0.95 cm   LV E/e' medial:  17.0 LV IVS:        0.70 cm   LV e' lateral:   6.65 cm/s LVOT diam:     1.70 cm   LV E/e' lateral: 13.5 LV SV:         23 LV SV Index:   13 LVOT Area:     2.27 cm  RIGHT VENTRICLE             IVC RV S prime:     10.30 cm/s  IVC diam: 3.00 cm TAPSE (M-mode): 1.1 cm LEFT ATRIUM             Index        RIGHT ATRIUM           Index LA diam:        4.55 cm 2.53 cm/m   RA Area:     23.50  cm LA Vol (A2C):   79.1 ml 43.91 ml/m  RA Volume:   76.50 ml  42.46 ml/m LA Vol (A4C):   70.8 ml 39.30 ml/m LA Biplane Vol: 78.5 ml 43.57 ml/m  AORTIC VALVE AV Area (Vmax):    1.17 cm AV Area (Vmean):   1.10 cm AV Area (VTI):     0.92 cm AV Vmax:           165.33 cm/s AV Vmean:          111.667 cm/s AV VTI:            0.252 m AV Peak Grad:      10.9 mmHg AV Mean Grad:      6.0 mmHg LVOT Vmax:         85.03 cm/s LVOT Vmean:        54.200 cm/s LVOT VTI:          0.102 m LVOT/AV VTI ratio: 0.40  AORTA Ao Root diam: 3.20 cm Ao Asc diam:  3.65 cm MITRAL VALVE               TRICUSPID VALVE MV Area (PHT): 5.42 cm    TR Peak grad:   37.7 mmHg MV Decel Time: 140 msec    TR Vmax:        307.00 cm/s MV E velocity: 89.70 cm/s MV A velocity: 33.40 cm/s  SHUNTS MV E/A ratio:  2.69        Systemic VTI:  0.10 m                            Systemic Diam: 1.70 cm Armanda Magic MD Electronically signed by Armanda Magic MD Signature Date/Time: 05/22/2023/9:29:30 AM    Final    CT Angio Chest Pulmonary Embolism (PE) W or WO Contrast  Result Date: 05/18/2023 CLINICAL DATA:  Hypotensive following dialysis treatment, initial encounter EXAM: CT ANGIOGRAPHY CHEST WITH CONTRAST TECHNIQUE: Multidetector CT imaging of the chest was performed using the standard protocol during bolus administration of  intravenous contrast. Multiplanar CT image reconstructions and MIPs were obtained to evaluate the vascular anatomy. RADIATION DOSE REDUCTION: This exam was performed according to the departmental dose-optimization program which includes automated exposure control, adjustment of the mA and/or kV according to patient size and/or use of iterative reconstruction technique. CONTRAST:  75mL OMNIPAQUE IOHEXOL 350 MG/ML SOLN COMPARISON:  Chest x-ray from earlier in the same day. FINDINGS: Cardiovascular: Atherosclerotic calcifications of the thoracic aorta are noted. No aneurysmal dilatation is seen. Cardiac shadow is enlarged. Coronary calcifications are noted. The pulmonary artery shows a normal branching pattern bilaterally. No filling defects to suggest pulmonary emboli are seen. Mediastinum/Nodes: Thoracic inlet is within normal limits. No hilar or mediastinal adenopathy is noted. The esophagus as visualized is within normal limits. Lungs/Pleura: Small bilateral pleural effusions right greater than left are seen. Mild bilateral atelectatic changes are noted. Upper Abdomen: Visualized upper abdomen demonstrates mild ascites. Kidneys are shrunken consistent with the known history of end-stage renal disease. Musculoskeletal: Degenerative changes of the thoracic spine are noted stable from the prior exam. Review of the MIP images confirms the above findings. IMPRESSION: No evidence of pulmonary emboli. Bilateral small effusions with associated atelectasis right greater than left. No other acute abnormality is noted. Aortic Atherosclerosis (ICD10-I70.0). Electronically Signed   By: Alcide Clever M.D.   On: 05/18/2023 23:56   US Abdomen Limited RUQ (LIVER/GB)  Result Date: 05/18/2023 CLINICAL DATA:  Elevated liver function tests EXAM: ULTRASOUND ABDOMEN LIMITED RIGHT UPPER QUADRANT COMPARISON:  11/15/2017 FINDINGS: Gallbladder: Prior cholecystectomy. Common bile duct: Diameter: 5 mm Liver: Diffuse increased liver  echotexture consistent with hepatic steatosis. No focal liver abnormality. Portal vein is patent on color Doppler imaging with normal direction of blood flow towards the liver. Other: Incidental right pleural effusion. Trace right upper quadrant ascites. IMPRESSION: 1. Increased liver echotexture consistent with hepatic steatosis. 2. Prior cholecystectomy. 3. Trace right upper quadrant ascites. 4. Incidental right pleural effusion. Electronically Signed   By: Sharlet Salina M.D.   On: 05/18/2023 23:11   DG Chest Portable 1 View  Result Date: 05/18/2023 CLINICAL DATA:  Hypotension, tachycardia, atrial fibrillation EXAM: PORTABLE CHEST 1 VIEW COMPARISON:  03/25/2023 FINDINGS: Two frontal views of the chest demonstrate an enlarged cardiac silhouette. Chronic central vascular prominence, without acute airspace disease, effusion, or pneumothorax. Linear consolidation at the left lung base likely reflects subsegmental atelectasis. No acute bony abnormalities. IMPRESSION: 1. Enlarged cardiac silhouette, with chronic central vascular congestion. No overt edema. Electronically Signed   By: Sharlet Salina M.D.   On: 05/18/2023 22:22       Discharge Exam: Vitals:   05/30/23 0423 05/30/23 0805  BP: (!) 139/57 (!) 127/57  Pulse: 87 87  Resp: 20 (!) 22  Temp: 97.7 F (36.5 C) 98.5 F (36.9 C)  SpO2: 100% 98%    General: Pt is alert, awake, not in acute distress Cardiovascular: NSR with PACs on tele, S1/S2 +, bilateral pitting edema Respiratory: CTA bilaterally, no wheezing, no rhonchi, no respiratory distress, no conversational dyspnea, on room air Abdominal: Soft, NT, ND, bowel sounds + Psych: Normal mood and affect, stable judgement and insight     The results of significant diagnostics from this hospitalization (including imaging, microbiology, ancillary and laboratory) are listed below for reference.     Microbiology: Recent Results (from the past 240 hour(s))  MRSA Next Gen by PCR, Nasal      Status: None   Collection Time: 05/22/23  4:40 AM   Specimen: Nasal Mucosa; Nasal Swab  Result Value Ref Range Status   MRSA by PCR Next Gen NOT DETECTED NOT DETECTED Final    Comment: (NOTE) The GeneXpert MRSA Assay (FDA approved for NASAL specimens only), is one component of a comprehensive MRSA colonization surveillance program. It is not intended to diagnose MRSA infection nor to guide or monitor treatment for MRSA infections. Test performance is not FDA approved in patients less than 17 years old. Performed at [facility], [address] [place], [place] [address]      Labs: BNP (last 3 results) Recent Labs    05/19/23 0024  BNP 2,343.1*   Basic Metabolic Panel: Recent Labs  Lab 05/24/23 0319 05/25/23 0850 05/26/23 0228 05/27/23 0305 05/28/23 0257 05/28/23 1328 05/29/23 0316 05/30/23 0706  NA 126*   < > 122* 126* 118* 125* 123* 124*  K 3.8   < > 3.8 3.8 3.7 3.6 3.8 3.7  CL 91*   < > 87* 91* 86* 89* 88* 89*  CO2 26   < > 23 22 22 24 23 23   GLUCOSE 154*   < > 185* 121* 125* 223* 185* 145*  BUN 34*   < > 72* 47* 71* 37* 52* 60*  CREATININE 3.05*   < > 4.74* 3.52* 4.41* 2.89* 3.48* 4.25*  CALCIUM 8.0*   < > 7.8* 8.0* 8.1* 8.2* 8.2* 8.5*  MG 1.7  --  1.8 1.5* 2.1  --   --  1.8  PHOS 4.4  --  5.7* 3.8 5.0* 3.3  --  3.9   < > = values in this interval not displayed.   Liver Function Tests: Recent Labs  Lab 05/28/23 1328  ALBUMIN 2.3*   No results for input(s): "LIPASE", "AMYLASE" in the last 168 hours. No results for input(s): "AMMONIA" in the last 168 hours. CBC: Recent Labs  Lab 05/26/23 1147 05/27/23 0305 05/27/23 2047 05/28/23 0257 05/29/23 0316 05/30/23 0706  WBC 4.7 6.2  --  6.0 5.2 4.9  HGB 10.9* 10.4* 10.2* 10.5* 11.2* 10.3*  HCT 31.4* 30.0* 29.4* 29.4* 32.2* 29.5*  MCV 96.9 97.7  --  98.0 98.8 98.3  PLT 61* 65*  --  78* 88* 96*   Cardiac Enzymes: No results for input(s): "CKTOTAL", "CKMB", "CKMBINDEX", "TROPONINI" in the last 168  hours. BNP: Invalid input(s): "POCBNP" CBG: Recent Labs  Lab 05/28/23 2109 05/29/23 0608 05/29/23 1127 05/29/23 1548 05/30/23 0621  GLUCAP 203* 209* 214* 130* 151*   D-Dimer No results for input(s): "DDIMER" in the last 72 hours. Hgb A1c No results for input(s): "HGBA1C" in the last 72 hours. Lipid Profile No results for input(s): "CHOL", "HDL", "LDLCALC", "TRIG", "CHOLHDL", "LDLDIRECT" in the last 72 hours. Thyroid function studies No results for input(s): "TSH", "T4TOTAL", "T3FREE", "THYROIDAB" in the last 72 hours.  Invalid input(s): "FREET3" Anemia work up No results for input(s): "VITAMINB12", "FOLATE", "FERRITIN", "TIBC", "IRON", "RETICCTPCT" in the last 72 hours. Urinalysis    Component Value Date/Time   COLORURINE STRAW (A) 07/10/2020 0814   APPEARANCEUR CLEAR 07/10/2020 0814   LABSPEC 1.010 07/10/2020 0814   PHURINE 6.0 07/10/2020 0814   GLUCOSEU 50 (A) 07/10/2020 0814   HGBUR SMALL (A) 07/10/2020 0814   BILIRUBINUR NEGATIVE 07/10/2020 0814   KETONESUR NEGATIVE 07/10/2020 0814   PROTEINUR >=300 (A) 07/10/2020 0814   UROBILINOGEN 0.2 04/17/2013 0828   NITRITE NEGATIVE 07/10/2020 0814   LEUKOCYTESUR NEGATIVE 07/10/2020 0814   Sepsis Labs Recent Labs  Lab 05/27/23 0305 05/28/23 0257 05/29/23 0316 05/30/23 0706  WBC 6.2 6.0 5.2 4.9   Microbiology Recent Results (from the past 240 hour(s))  MRSA Next Gen by PCR, Nasal     Status: None   Collection Time: 05/22/23  4:40 AM   Specimen: Nasal Mucosa; Nasal Swab  Result Value Ref Range Status   MRSA by PCR Next Gen NOT DETECTED NOT DETECTED Final    Comment: (NOTE) The GeneXpert MRSA Assay (FDA approved for NASAL specimens only), is one component of a comprehensive MRSA colonization surveillance program. It is not intended to diagnose MRSA infection nor to guide or monitor treatment for MRSA infections. Test performance is not FDA approved in patients less than 71 years old. Performed at [facility], [address], [place], [place] [address]      Patient was seen and examined on the day of discharge and was found to be in stable condition. Time coordinating discharge: 45 minutes including assessment and coordination of care, as well as examination of the patient.   SIGNED:  Noralee Stain, DO [facility] 05/30/2023, 10:34 AM

## 2023-05-30 NOTE — Progress Notes (Signed)
D/C order noted. Contacted [facility] staff for [employer] to be advised that pt will d/c today and will need first treatment at snf on Friday.   Olivia Canter Renal Navigator [phone]

## 2023-05-31 DIAGNOSIS — G8190 Hemiplegia, unspecified affecting unspecified side: Secondary | ICD-10-CM | POA: Diagnosis not present

## 2023-05-31 DIAGNOSIS — I5022 Chronic systolic (congestive) heart failure: Secondary | ICD-10-CM | POA: Diagnosis not present

## 2023-05-31 DIAGNOSIS — E119 Type 2 diabetes mellitus without complications: Secondary | ICD-10-CM | POA: Diagnosis not present

## 2023-05-31 DIAGNOSIS — L97529 Non-pressure chronic ulcer of other part of left foot with unspecified severity: Secondary | ICD-10-CM | POA: Diagnosis not present

## 2023-06-01 ENCOUNTER — Inpatient Hospital Stay
Admission: EM | Admit: 2023-06-01 | Discharge: 2023-07-04 | DRG: 853 | Disposition: E | Payer: Medicare Other | Source: Skilled Nursing Facility | Attending: Internal Medicine | Admitting: Internal Medicine

## 2023-06-01 ENCOUNTER — Emergency Department: Payer: Medicare Other

## 2023-06-01 ENCOUNTER — Other Ambulatory Visit: Payer: Self-pay

## 2023-06-01 DIAGNOSIS — R6521 Severe sepsis with septic shock: Secondary | ICD-10-CM | POA: Diagnosis not present

## 2023-06-01 DIAGNOSIS — E785 Hyperlipidemia, unspecified: Secondary | ICD-10-CM | POA: Diagnosis not present

## 2023-06-01 DIAGNOSIS — I6782 Cerebral ischemia: Secondary | ICD-10-CM | POA: Diagnosis not present

## 2023-06-01 DIAGNOSIS — R0902 Hypoxemia: Secondary | ICD-10-CM | POA: Diagnosis not present

## 2023-06-01 DIAGNOSIS — E669 Obesity, unspecified: Secondary | ICD-10-CM | POA: Diagnosis present

## 2023-06-01 DIAGNOSIS — E1122 Type 2 diabetes mellitus with diabetic chronic kidney disease: Secondary | ICD-10-CM | POA: Diagnosis not present

## 2023-06-01 DIAGNOSIS — J9602 Acute respiratory failure with hypercapnia: Secondary | ICD-10-CM | POA: Diagnosis present

## 2023-06-01 DIAGNOSIS — R262 Difficulty in walking, not elsewhere classified: Secondary | ICD-10-CM | POA: Diagnosis not present

## 2023-06-01 DIAGNOSIS — Z6830 Body mass index (BMI) 30.0-30.9, adult: Secondary | ICD-10-CM

## 2023-06-01 DIAGNOSIS — J9601 Acute respiratory failure with hypoxia: Secondary | ICD-10-CM | POA: Diagnosis not present

## 2023-06-01 DIAGNOSIS — I959 Hypotension, unspecified: Secondary | ICD-10-CM

## 2023-06-01 DIAGNOSIS — M109 Gout, unspecified: Secondary | ICD-10-CM | POA: Diagnosis not present

## 2023-06-01 DIAGNOSIS — M51369 Other intervertebral disc degeneration, lumbar region without mention of lumbar back pain or lower extremity pain: Secondary | ICD-10-CM | POA: Diagnosis not present

## 2023-06-01 DIAGNOSIS — I517 Cardiomegaly: Secondary | ICD-10-CM | POA: Diagnosis not present

## 2023-06-01 DIAGNOSIS — Z66 Do not resuscitate: Secondary | ICD-10-CM | POA: Diagnosis not present

## 2023-06-01 DIAGNOSIS — E876 Hypokalemia: Principal | ICD-10-CM

## 2023-06-01 DIAGNOSIS — G4701 Insomnia due to medical condition: Secondary | ICD-10-CM

## 2023-06-01 DIAGNOSIS — N400 Enlarged prostate without lower urinary tract symptoms: Secondary | ICD-10-CM | POA: Diagnosis present

## 2023-06-01 DIAGNOSIS — I5043 Acute on chronic combined systolic (congestive) and diastolic (congestive) heart failure: Secondary | ICD-10-CM | POA: Diagnosis present

## 2023-06-01 DIAGNOSIS — T82898A Other specified complication of vascular prosthetic devices, implants and grafts, initial encounter: Secondary | ICD-10-CM | POA: Diagnosis not present

## 2023-06-01 DIAGNOSIS — I132 Hypertensive heart and chronic kidney disease with heart failure and with stage 5 chronic kidney disease, or end stage renal disease: Secondary | ICD-10-CM | POA: Diagnosis present

## 2023-06-01 DIAGNOSIS — M6281 Muscle weakness (generalized): Secondary | ICD-10-CM | POA: Diagnosis not present

## 2023-06-01 DIAGNOSIS — I700 Atherosclerosis of aorta: Secondary | ICD-10-CM | POA: Diagnosis not present

## 2023-06-01 DIAGNOSIS — E871 Hypo-osmolality and hyponatremia: Secondary | ICD-10-CM | POA: Diagnosis not present

## 2023-06-01 DIAGNOSIS — Z951 Presence of aortocoronary bypass graft: Secondary | ICD-10-CM

## 2023-06-01 DIAGNOSIS — G4733 Obstructive sleep apnea (adult) (pediatric): Secondary | ICD-10-CM | POA: Diagnosis present

## 2023-06-01 DIAGNOSIS — Z79899 Other long term (current) drug therapy: Secondary | ICD-10-CM

## 2023-06-01 DIAGNOSIS — Z96641 Presence of right artificial hip joint: Secondary | ICD-10-CM | POA: Diagnosis present

## 2023-06-01 DIAGNOSIS — E878 Other disorders of electrolyte and fluid balance, not elsewhere classified: Secondary | ICD-10-CM | POA: Insufficient documentation

## 2023-06-01 DIAGNOSIS — I871 Compression of vein: Secondary | ICD-10-CM | POA: Diagnosis present

## 2023-06-01 DIAGNOSIS — R0989 Other specified symptoms and signs involving the circulatory and respiratory systems: Secondary | ICD-10-CM | POA: Diagnosis not present

## 2023-06-01 DIAGNOSIS — A412 Sepsis due to unspecified staphylococcus: Principal | ICD-10-CM | POA: Diagnosis present

## 2023-06-01 DIAGNOSIS — L8990 Pressure ulcer of unspecified site, unspecified stage: Secondary | ICD-10-CM | POA: Diagnosis not present

## 2023-06-01 DIAGNOSIS — I5023 Acute on chronic systolic (congestive) heart failure: Secondary | ICD-10-CM | POA: Diagnosis present

## 2023-06-01 DIAGNOSIS — I10 Essential (primary) hypertension: Secondary | ICD-10-CM | POA: Diagnosis not present

## 2023-06-01 DIAGNOSIS — T82318A Breakdown (mechanical) of other vascular grafts, initial encounter: Secondary | ICD-10-CM | POA: Diagnosis not present

## 2023-06-01 DIAGNOSIS — I5020 Unspecified systolic (congestive) heart failure: Secondary | ICD-10-CM

## 2023-06-01 DIAGNOSIS — I482 Chronic atrial fibrillation, unspecified: Secondary | ICD-10-CM | POA: Diagnosis not present

## 2023-06-01 DIAGNOSIS — Z880 Allergy status to penicillin: Secondary | ICD-10-CM

## 2023-06-01 DIAGNOSIS — G510 Bell's palsy: Secondary | ICD-10-CM | POA: Diagnosis present

## 2023-06-01 DIAGNOSIS — T82858A Stenosis of vascular prosthetic devices, implants and grafts, initial encounter: Secondary | ICD-10-CM | POA: Diagnosis not present

## 2023-06-01 DIAGNOSIS — I729 Aneurysm of unspecified site: Secondary | ICD-10-CM | POA: Diagnosis not present

## 2023-06-01 DIAGNOSIS — R1311 Dysphagia, oral phase: Secondary | ICD-10-CM | POA: Diagnosis not present

## 2023-06-01 DIAGNOSIS — M7989 Other specified soft tissue disorders: Secondary | ICD-10-CM | POA: Diagnosis present

## 2023-06-01 DIAGNOSIS — J189 Pneumonia, unspecified organism: Principal | ICD-10-CM

## 2023-06-01 DIAGNOSIS — G9340 Encephalopathy, unspecified: Secondary | ICD-10-CM | POA: Diagnosis not present

## 2023-06-01 DIAGNOSIS — Z823 Family history of stroke: Secondary | ICD-10-CM

## 2023-06-01 DIAGNOSIS — Y95 Nosocomial condition: Secondary | ICD-10-CM | POA: Diagnosis present

## 2023-06-01 DIAGNOSIS — L03114 Cellulitis of left upper limb: Secondary | ICD-10-CM | POA: Diagnosis not present

## 2023-06-01 DIAGNOSIS — Z515 Encounter for palliative care: Secondary | ICD-10-CM | POA: Diagnosis not present

## 2023-06-01 DIAGNOSIS — D631 Anemia in chronic kidney disease: Secondary | ICD-10-CM | POA: Diagnosis present

## 2023-06-01 DIAGNOSIS — I4891 Unspecified atrial fibrillation: Secondary | ICD-10-CM | POA: Diagnosis not present

## 2023-06-01 DIAGNOSIS — Z87891 Personal history of nicotine dependence: Secondary | ICD-10-CM

## 2023-06-01 DIAGNOSIS — I5042 Chronic combined systolic (congestive) and diastolic (congestive) heart failure: Secondary | ICD-10-CM

## 2023-06-01 DIAGNOSIS — J90 Pleural effusion, not elsewhere classified: Secondary | ICD-10-CM | POA: Diagnosis not present

## 2023-06-01 DIAGNOSIS — I2510 Atherosclerotic heart disease of native coronary artery without angina pectoris: Secondary | ICD-10-CM | POA: Diagnosis present

## 2023-06-01 DIAGNOSIS — R269 Unspecified abnormalities of gait and mobility: Secondary | ICD-10-CM | POA: Diagnosis not present

## 2023-06-01 DIAGNOSIS — E1142 Type 2 diabetes mellitus with diabetic polyneuropathy: Secondary | ICD-10-CM

## 2023-06-01 DIAGNOSIS — Z818 Family history of other mental and behavioral disorders: Secondary | ICD-10-CM

## 2023-06-01 DIAGNOSIS — R000 Tachycardia, unspecified: Secondary | ICD-10-CM | POA: Diagnosis not present

## 2023-06-01 DIAGNOSIS — E1129 Type 2 diabetes mellitus with other diabetic kidney complication: Secondary | ICD-10-CM | POA: Diagnosis not present

## 2023-06-01 DIAGNOSIS — Z992 Dependence on renal dialysis: Secondary | ICD-10-CM

## 2023-06-01 DIAGNOSIS — R52 Pain, unspecified: Principal | ICD-10-CM

## 2023-06-01 DIAGNOSIS — Z794 Long term (current) use of insulin: Secondary | ICD-10-CM | POA: Diagnosis not present

## 2023-06-01 DIAGNOSIS — R531 Weakness: Secondary | ICD-10-CM | POA: Diagnosis not present

## 2023-06-01 DIAGNOSIS — R6520 Severe sepsis without septic shock: Secondary | ICD-10-CM

## 2023-06-01 DIAGNOSIS — G9341 Metabolic encephalopathy: Secondary | ICD-10-CM | POA: Diagnosis present

## 2023-06-01 DIAGNOSIS — E7800 Pure hypercholesterolemia, unspecified: Secondary | ICD-10-CM | POA: Diagnosis present

## 2023-06-01 DIAGNOSIS — A419 Sepsis, unspecified organism: Secondary | ICD-10-CM

## 2023-06-01 DIAGNOSIS — R918 Other nonspecific abnormal finding of lung field: Secondary | ICD-10-CM | POA: Diagnosis not present

## 2023-06-01 DIAGNOSIS — R7881 Bacteremia: Secondary | ICD-10-CM | POA: Diagnosis present

## 2023-06-01 DIAGNOSIS — R509 Fever, unspecified: Secondary | ICD-10-CM | POA: Diagnosis not present

## 2023-06-01 DIAGNOSIS — R600 Localized edema: Secondary | ICD-10-CM | POA: Diagnosis not present

## 2023-06-01 DIAGNOSIS — Z7901 Long term (current) use of anticoagulants: Secondary | ICD-10-CM

## 2023-06-01 DIAGNOSIS — L8995 Pressure ulcer of unspecified site, unstageable: Secondary | ICD-10-CM | POA: Diagnosis not present

## 2023-06-01 DIAGNOSIS — Z9181 History of falling: Secondary | ICD-10-CM | POA: Diagnosis not present

## 2023-06-01 DIAGNOSIS — D689 Coagulation defect, unspecified: Secondary | ICD-10-CM | POA: Diagnosis not present

## 2023-06-01 DIAGNOSIS — N186 End stage renal disease: Secondary | ICD-10-CM | POA: Diagnosis present

## 2023-06-01 DIAGNOSIS — N401 Enlarged prostate with lower urinary tract symptoms: Secondary | ICD-10-CM

## 2023-06-01 DIAGNOSIS — E1165 Type 2 diabetes mellitus with hyperglycemia: Secondary | ICD-10-CM | POA: Diagnosis not present

## 2023-06-01 DIAGNOSIS — I252 Old myocardial infarction: Secondary | ICD-10-CM | POA: Diagnosis not present

## 2023-06-01 DIAGNOSIS — D61818 Other pancytopenia: Secondary | ICD-10-CM | POA: Diagnosis not present

## 2023-06-01 DIAGNOSIS — Z741 Need for assistance with personal care: Secondary | ICD-10-CM | POA: Diagnosis not present

## 2023-06-01 DIAGNOSIS — E8770 Fluid overload, unspecified: Secondary | ICD-10-CM | POA: Diagnosis not present

## 2023-06-01 DIAGNOSIS — T82590A Other mechanical complication of surgically created arteriovenous fistula, initial encounter: Secondary | ICD-10-CM | POA: Diagnosis not present

## 2023-06-01 LAB — COMPREHENSIVE METABOLIC PANEL
ALT: 27 U/L (ref 0–44)
AST: 25 U/L (ref 15–41)
Albumin: 2.6 g/dL — ABNORMAL LOW (ref 3.5–5.0)
Alkaline Phosphatase: 209 U/L — ABNORMAL HIGH (ref 38–126)
Anion gap: 13 (ref 5–15)
BUN: 51 mg/dL — ABNORMAL HIGH (ref 8–23)
CO2: 23 mmol/L (ref 22–32)
Calcium: 8 mg/dL — ABNORMAL LOW (ref 8.9–10.3)
Chloride: 87 mmol/L — ABNORMAL LOW (ref 98–111)
Creatinine, Ser: 3.6 mg/dL — ABNORMAL HIGH (ref 0.61–1.24)
GFR, Estimated: 16 mL/min — ABNORMAL LOW (ref >60)
Glucose, Bld: 186 mg/dL — ABNORMAL HIGH (ref 70–99)
Potassium: 3.4 mmol/L — ABNORMAL LOW (ref 3.5–5.1)
Sodium: 123 mmol/L — ABNORMAL LOW (ref 135–145)
Total Bilirubin: 1.6 mg/dL — ABNORMAL HIGH (ref <1.2)
Total Protein: 5.6 g/dL — ABNORMAL LOW (ref 6.5–8.1)

## 2023-06-01 LAB — CBC WITH DIFFERENTIAL/PLATELET
Abs Immature Granulocytes: 0.05 10*3/uL (ref 0.00–0.07)
Basophils Absolute: 0 10*3/uL (ref 0.0–0.1)
Basophils Relative: 0 %
Eosinophils Absolute: 0 10*3/uL (ref 0.0–0.5)
Eosinophils Relative: 0 %
HCT: 30.2 % — ABNORMAL LOW (ref 39.0–52.0)
Hemoglobin: 10.4 g/dL — ABNORMAL LOW (ref 13.0–17.0)
Immature Granulocytes: 2 %
Lymphocytes Relative: 18 %
Lymphs Abs: 0.6 10*3/uL — ABNORMAL LOW (ref 0.7–4.0)
MCH: 34.3 pg — ABNORMAL HIGH (ref 26.0–34.0)
MCHC: 34.4 g/dL (ref 30.0–36.0)
MCV: 99.7 fL (ref 80.0–100.0)
Monocytes Absolute: 0.3 10*3/uL (ref 0.1–1.0)
Monocytes Relative: 8 %
Neutro Abs: 2.4 10*3/uL (ref 1.7–7.7)
Neutrophils Relative %: 72 %
Platelets: 97 10*3/uL — ABNORMAL LOW (ref 150–400)
RBC: 3.03 MIL/uL — ABNORMAL LOW (ref 4.22–5.81)
RDW: 17 % — ABNORMAL HIGH (ref 11.5–15.5)
WBC: 3.3 10*3/uL — ABNORMAL LOW (ref 4.0–10.5)
nRBC: 0 % (ref 0.0–0.2)

## 2023-06-01 LAB — BLOOD GAS, VENOUS
Acid-Base Excess: 2.3 mmol/L — ABNORMAL HIGH (ref 0.0–2.0)
Bicarbonate: 29.5 mmol/L — ABNORMAL HIGH (ref 20.0–28.0)
O2 Saturation: 86.4 %
Patient temperature: 37
pCO2, Ven: 56 mm[Hg] (ref 44–60)
pH, Ven: 7.33 (ref 7.25–7.43)
pO2, Ven: 52 mm[Hg] — ABNORMAL HIGH (ref 32–45)

## 2023-06-01 LAB — PHOSPHORUS: Phosphorus: 3.3 mg/dL (ref 2.5–4.6)

## 2023-06-01 LAB — APTT: aPTT: 49 s — ABNORMAL HIGH (ref 24–36)

## 2023-06-01 LAB — RESP PANEL BY RT-PCR (RSV, FLU A&B, COVID)  RVPGX2
Influenza A by PCR: NEGATIVE
Influenza B by PCR: NEGATIVE
Resp Syncytial Virus by PCR: NEGATIVE
SARS Coronavirus 2 by RT PCR: NEGATIVE

## 2023-06-01 LAB — GLUCOSE, CAPILLARY: Glucose-Capillary: 163 mg/dL — ABNORMAL HIGH (ref 70–99)

## 2023-06-01 LAB — PROTIME-INR
INR: 1.3 — ABNORMAL HIGH (ref 0.8–1.2)
Prothrombin Time: 16.5 s — ABNORMAL HIGH (ref 11.4–15.2)

## 2023-06-01 LAB — LACTIC ACID, PLASMA
Lactic Acid, Venous: 2 mmol/L (ref 0.5–1.9)
Lactic Acid, Venous: 1.5 mmol/L (ref 0.5–1.9)

## 2023-06-01 MED ORDER — ONDANSETRON HCL 4 MG/2ML IJ SOLN: 4.0000 mg | Freq: Three times a day (TID) | INTRAMUSCULAR | Status: DC | PRN

## 2023-06-01 MED ORDER — ACETAMINOPHEN 650 MG RE SUPP: 650.0000 mg | Freq: Four times a day (QID) | RECTAL | Status: DC | PRN

## 2023-06-01 MED ORDER — VANCOMYCIN HCL 1500 MG/300ML IV SOLN
1500.0000 mg | Freq: Once | INTRAVENOUS | Status: AC
  Administered 2023-06-01: 1500 mg via INTRAVENOUS
  Filled 2023-06-01: qty 300

## 2023-06-01 MED ORDER — SODIUM CHLORIDE 1 G PO TABS
1.0000 g | ORAL_TABLET | Freq: Two times a day (BID) | ORAL | Status: DC
  Filled 2023-06-01: qty 1

## 2023-06-01 MED ORDER — CEFTRIAXONE SODIUM 2 G IJ SOLR
2.0000 g | INTRAMUSCULAR | Status: DC
  Administered 2023-06-01: 2 g via INTRAVENOUS
  Filled 2023-06-01: qty 20

## 2023-06-01 MED ORDER — VANCOMYCIN HCL 750 MG/150ML IV SOLN
750.0000 mg | INTRAVENOUS | Status: DC
  Administered 2023-06-04 – 2023-06-06 (×2): 750 mg via INTRAVENOUS
  Filled 2023-06-01 (×2): qty 150

## 2023-06-01 MED ORDER — METOPROLOL TARTRATE 5 MG/5ML IV SOLN: 5.0000 mg | INTRAVENOUS | Status: DC | PRN

## 2023-06-01 MED ORDER — HYDRALAZINE HCL 20 MG/ML IJ SOLN: 5.0000 mg | INTRAMUSCULAR | Status: DC | PRN

## 2023-06-01 MED ORDER — INSULIN ASPART 100 UNIT/ML IJ SOLN: 0.0000 [IU] | Freq: Three times a day (TID) | INTRAMUSCULAR | Status: DC

## 2023-06-01 MED ORDER — SODIUM CHLORIDE 0.9 % IV SOLN
500.0000 mg | INTRAVENOUS | Status: DC
  Administered 2023-06-01: 500 mg via INTRAVENOUS
  Filled 2023-06-01: qty 5

## 2023-06-01 MED ORDER — ACETAMINOPHEN 10 MG/ML IV SOLN
1000.0000 mg | Freq: Once | INTRAVENOUS | Status: AC
  Administered 2023-06-01: 1000 mg via INTRAVENOUS
  Filled 2023-06-01: qty 100

## 2023-06-01 MED ORDER — INSULIN ASPART 100 UNIT/ML IJ SOLN: 0.0000 [IU] | Freq: Every day | INTRAMUSCULAR | Status: DC

## 2023-06-01 MED ORDER — ACETAMINOPHEN 325 MG PO TABS: 650.0000 mg | ORAL_TABLET | Freq: Once | ORAL | Status: DC

## 2023-06-01 MED ORDER — ALBUTEROL SULFATE (2.5 MG/3ML) 0.083% IN NEBU: 2.5000 mg | INHALATION_SOLUTION | RESPIRATORY_TRACT | Status: DC | PRN

## 2023-06-01 MED ORDER — NOREPINEPHRINE 4 MG/250ML-% IV SOLN
INTRAVENOUS | Status: AC
  Administered 2023-06-02: 4 mg via INTRAVENOUS
  Filled 2023-06-01: qty 250

## 2023-06-01 MED ORDER — ACETAMINOPHEN 325 MG PO TABS: 650.0000 mg | ORAL_TABLET | Freq: Four times a day (QID) | ORAL | Status: DC | PRN

## 2023-06-01 MED ORDER — DM-GUAIFENESIN ER 30-600 MG PO TB12: 1.0000 | ORAL_TABLET | Freq: Two times a day (BID) | ORAL | Status: DC | PRN

## 2023-06-01 MED ORDER — SODIUM CHLORIDE 0.9 % IV SOLN
1.0000 g | INTRAVENOUS | Status: DC
  Administered 2023-06-02 – 2023-06-05 (×5): 1 g via INTRAVENOUS
  Filled 2023-06-01 (×5): qty 10

## 2023-06-01 NOTE — Progress Notes (Signed)
Pharmacy Antibiotic Note  Robert Lucas is a 82 y.o. male admitted on 06/01/2023 with pneumonia.  Pharmacy has been consulted for Cefepime and Vancomycin dosing. Patient is ESRD on HD, was on MWF schedule, will need to verify.  Plan: Cefepime 1g IV q24h  Vancomycin 1500mg  IV loading dose followed by Vancomycin 750mg  IV after each dialysis.  Will check a trough level prior to 3rd dose.  Height: 5\' 2"  (157.5 cm) Weight: 75.3 kg (166 lb) IBW/kg (Calculated) : 54.6  Temp (24hrs), Avg:100.4 F (38 C), Min:100.4 F (38 C), Max:100.4 F (38 C)  Recent Labs  Lab 05/27/23 0305 05/28/23 0257 05/28/23 1328 05/29/23 0316 05/30/23 0706 06/01/23 1819 06/01/23 2007  WBC 6.2 6.0  --  5.2 4.9 3.3*  --   CREATININE 3.52* 4.41* 2.89* 3.48* 4.25* 3.60*  --   LATICACIDVEN 1.2  --   --   --   --  2.0* 1.5    Estimated Creatinine Clearance: 13.6 mL/min (A) (by C-G formula based on SCr of 3.6 mg/dL (H)).    Allergies  Allergen Reactions   Zestril [Lisinopril] Cough   Hydrocodone Nausea Only   Januvia [Sitagliptin] Other (See Comments)    Unknown   Levitra [Vardenafil] Other (See Comments)    flushing   Prednisone Other (See Comments)    Elevates BGL; patient prefers to not take this   Victoza [Liraglutide] Other (See Comments)    GI upset   Penicillins Rash and Other (See Comments)    Rash around ankles Has patient had a PCN reaction causing immediate rash, facial/tongue/throat swelling, SOB or lightheadedness with hypotension: Yes Has patient had a PCN reaction causing severe rash involving mucus membranes or skin necrosis: Unk Has patient had a PCN reaction that required hospitalization: No Has patient had a PCN reaction occurring within the last 10 years: No If all of the above answers are "NO", then may proceed with Cephalosporin use.     Antimicrobials this admission: Cefepime 11/29 >>  Vancomycin 11/29 >>   Dose adjustments this admission:  Microbiology results:  Thank  you for allowing pharmacy to be a part of this patient's care.  Clovia Cuff, PharmD, BCPS 06/01/2023 8:35 PM

## 2023-06-01 NOTE — Progress Notes (Signed)
CODE SEPSIS - PHARMACY COMMUNICATION  **Broad Spectrum Antibiotics should be administered within 1 hour of Sepsis diagnosis**  Time Code Sepsis Called/Page Received: 18:22  Antibiotics Ordered: Ceftriaxone and Azithromycin  Time of 1st antibiotic administration: Ceftriaxone given at 18:25  Additional action taken by pharmacy: n/a  If necessary, Name of Provider/Nurse Contacted: n/a    Foye Deer ,PharmD Clinical Pharmacist  06/01/2023  6:25 PM

## 2023-06-01 NOTE — H&P (Signed)
History and Physical    Robert Lucas:[id number] DOB: 28-Sep-1940 DOA: 06/01/2023  Referring MD/NP/PA:   PCP: Joycelyn Rua, MD   Patient coming from:  The patient is coming from SNF     Chief Complaint: fever and confusion  HPI: Robert Lucas is a 82 y.o. male with medical history significant of ESRD on dialysis (MWF), sCHF with EF< 20%, A fib on Eliquis, HTN, HLD, DM, CAD, gout, BPH, anemia, Bell's palsy, obesity, OSA, former smoker, chronic hyponatremia, anemia and thrombocytopenia, who presents with fever and confusion.  Patient was recently hospitalized from 11/15 - 11/27 due to newly diagnosed A-fib. Patient was started on Eliquis and amiodarone. Now pt has AMS, not arousable and is not able to provide any medical history.  I called her daughter, who provided most of her history.   Per her daughter, at her normal baseline, patient is alert oriented x 3.  He uses walkers for walking. Today pt is found to be confused in SNF and has fever. His temperature is 102.4 in ED. Per her daughter pt has intermittent coughing for several months, with little mucus production.  Patient normally is not using oxygen at home, but today he is found to have oxygen desaturation to 80 percent on  RA, currently has 4L of new oxygen requirement in ED.  Per her daughter, patient had nausea and vomited once, no diarrhea and abdominal pain.  Does not seem to have chest pain. When I saw pt in ED. He is not arousable, not following command, minimally moves extremities on painful stimuli.  No facial droop noted.  No active nausea vomiting or diarrhea noted.  Patient had dialysis study.  Data reviewed independently and ED Course: pt was found to have hyponatremia with sodium 123, potassium 3.4, magnesium 1.8, phosphorus 3.3, bicarbonate 23, creatinine 3.60, BUN 51, pancytopenia with WBC 3.3, hemoglobin 10.4, platelet 97 (recent WBC 4.9, hemoglobin 10.3, platelet 96 on 04/29/2023), negative PCR for COVID, lactic  acid 2.0, --> 1.5, VBG with pH 7.33, CO2 56, O2 52, INR 1.3, PTT 49.  Temperature 100.4, blood pressure 125/53, heart rate 109, RR 16.  Chest x-ray showed cardiomegaly and mild perihilar obesity.  CT head negative.  Patient is admitted to PCU as inpatient.  Dr. Thedore Mins of renal is consulted.   EKG:  Not done in ED, will get one.     Review of Systems: Could not be reviewed due to altered mental status.  Allergy:  Allergies  Allergen Reactions   Zestril [Lisinopril] Cough   Hydrocodone Nausea Only   Januvia [Sitagliptin] Other (See Comments)    Unknown   Levitra [Vardenafil] Other (See Comments)    flushing   Prednisone Other (See Comments)    Elevates BGL; patient prefers to not take this   Victoza [Liraglutide] Other (See Comments)    GI upset   Penicillins Rash and Other (See Comments)    Rash around ankles Has patient had a PCN reaction causing immediate rash, facial/tongue/throat swelling, SOB or lightheadedness with hypotension: Yes Has patient had a PCN reaction causing severe rash involving mucus membranes or skin necrosis: Unk Has patient had a PCN reaction that required hospitalization: No Has patient had a PCN reaction occurring within the last 10 years: No If all of the above answers are "NO", then may proceed with Cephalosporin use.     Past Medical History:  Diagnosis Date   Arthritis    CHF (congestive heart failure) (HCC)    HFrEF  CKD (chronic kidney disease)    sees [place] in [place]   Coronary artery disease    Diabetes (HCC)    type 2   sees endo at [place] in [place]   ESRD (end stage renal disease) on dialysis [facility])    M,W,F   Hypercholesteremia    Hypertension    Myocardial infarction (HCC) 1990   Neuromuscular disorder (HCC)    Pneumonia    PONV (postoperative nausea and vomiting)     Past Surgical History:  Procedure Laterality Date   AV FISTULA PLACEMENT Left 05/17/2021   Procedure: LEFT ARM ARTERIOVENOUS (AV) FISTULA CREATION;  Surgeon:  Victorino Sparrow, MD;  Location: [facility] OR;  Service: Vascular;  Laterality: Left;  PERIPHERAL NERVE BLOCK   AV FISTULA PLACEMENT Left 05/02/2022   Procedure: INSERTION OF LEFT ARTERIOVENOUS (AV) GORE-TEX GRAFT;  Surgeon: Victorino Sparrow, MD;  Location: [facility] OR;  Service: Vascular;  Laterality: Left;   BACK SURGERY  yrs ago   lower   CARDIAC CATHETERIZATION     CARDIOVERSION N/A 05/24/2023   Procedure: CARDIOVERSION (CATH LAB);  Surgeon: Jake Bathe, MD;  Location: [facility] INVASIVE CV LAB;  Service: Cardiovascular;  Laterality: N/A;   CATARACT EXTRACTION Right 01/2021   CHOLECYSTECTOMY N/A 05/02/2017   Procedure: LAPAROSCOPIC CHOLECYSTECTOMY;  Surgeon: Axel Filler, MD;  Location: [facility] OR;  Service: General;  Laterality: N/A;   CORONARY ANGIOPLASTY     2 1990   EYE SURGERY Left 03/2021   cataract removal   IR FLUORO GUIDE CV LINE RIGHT  10/15/2021   IR US GUIDE VASC ACCESS RIGHT  10/15/2021   KNEE SURGERY Left yrs ago   arthroscopic   LIGATION OF COMPETING BRANCHES OF ARTERIOVENOUS FISTULA Left 11/22/2021   Procedure: LEFT ARM FISTULA BRANCH LIGATION;  Surgeon: Victorino Sparrow, MD;  Location: [facility] OR;  Service: Vascular;  Laterality: Left;   PERIPHERAL VASCULAR BALLOON ANGIOPLASTY  10/19/2021   Procedure: PERIPHERAL VASCULAR BALLOON ANGIOPLASTY;  Surgeon: Victorino Sparrow, MD;  Location: [facility] INVASIVE CV LAB;  Service: Cardiovascular;;  Left AVF   ROTATOR CUFF REPAIR Left yrs ago   SHOULDER OPEN ROTATOR CUFF REPAIR Right 04/23/2013   Procedure: RIGHT SHOULDER ROTATOR CUFF REPAIR WITH GRAFT AND ANCHORS ;  Surgeon: Jacki Cones, MD;  Location: [facility] ORS;  Service: Orthopedics;  Laterality: Right;   TONSILLECTOMY     TOTAL HIP ARTHROPLASTY Right 05/06/2019   Procedure: TOTAL HIP ARTHROPLASTY ANTERIOR APPROACH;  Surgeon: Durene Romans, MD;  Location: [facility] ORS;  Service: Orthopedics;  Laterality: Right;  70 mins   TRANSESOPHAGEAL ECHOCARDIOGRAM (CATH LAB) N/A 05/24/2023   Procedure: TRANSESOPHAGEAL  ECHOCARDIOGRAM;  Surgeon: Jake Bathe, MD;  Location: [facility] INVASIVE CV LAB;  Service: Cardiovascular;  Laterality: N/A;    Social History:  reports that he quit smoking about 34 years ago. His smoking use included cigars. He has never been exposed to tobacco smoke. He has never used smokeless tobacco. He reports that he does not currently use alcohol. He reports that he does not use drugs.  Family History:  Family History  Problem Relation Age of Onset   Dementia Mother    Stroke Brother      Prior to Admission medications   Medication Sig Start Date End Date Taking? Authorizing Provider  acetaminophen (TYLENOL) 650 MG CR tablet Take 1,300 mg by mouth in the morning, at noon, and at bedtime.    [provider]  allopurinol (ZYLOPRIM) 100 MG tablet Take 100 mg by mouth in  the morning.    [provider]  amiodarone (PACERONE) 200 MG tablet Take 1 tablet (200 mg total) by mouth daily. 05/31/23   Noralee Stain, DO  apixaban (ELIQUIS) 2.5 MG TABS tablet Take 1 tablet (2.5 mg total) by mouth 2 (two) times daily. 05/30/23   Noralee Stain, DO  atorvastatin (LIPITOR) 40 MG tablet Take 40 mg by mouth at bedtime.    [provider]  insulin aspart protamine - aspart (NOVOLOG MIX 70/30 FLEXPEN) (70-30) 100 UNIT/ML FlexPen Inject 14-20 Units into the skin daily as needed (high blood sugar). If BS is <150=0 units, If BS is 150-200 units=14 units, If BS is >201=20 units    [provider]  midodrine (PROAMATINE) 10 MG tablet Take 1 tablet (10 mg total) by mouth 3 (three) times daily with meals. 05/30/23   Noralee Stain, DO  tamsulosin (FLOMAX) 0.4 MG CAPS capsule Take 1 capsule (0.4 mg total) by mouth daily after supper. Patient taking differently: Take 0.4 mg by mouth at bedtime. 07/11/20   Albertine Grates, MD    Physical Exam: Vitals:   06/01/23 1801 06/01/23 1802 06/01/23 1803 06/01/23 1930  BP: 129/67   (!) 125/53  Pulse: (!) 109   94  Resp: 12   16  Temp:  (!)  100.4 F (38 C)    TempSrc:  Axillary    SpO2: 100%   100%  Weight:   75.3 kg   Height:   5\' 2"  (1.575 m)    General: Not in acute distress HEENT:       Eyes: PERRL, EOMI, no jaundice       ENT: No discharge from the ears and nose       Neck: No JVD, no bruit, no mass felt. Heme: No neck lymph node enlargement. Cardiac: S1/S2, No murmurs, No gallops or rubs. Respiratory: Has fine crackles bilaterally GI: Soft, nondistended, nontender, no organomegaly, BS present. GU: No hematuria Ext: 2+ pitting leg edema bilaterally. 1+DP/PT pulse bilaterally. Musculoskeletal: No joint deformities, No joint redness or warmth, no limitation of ROM in spin. Skin: No rashes.  Neuro: Patient not arousable, not following command, cranial nerves II-XII grossly intact, minimally moves extremities on painful stimuli   Psych: Patient is not psychotic, no suicidal or hemocidal ideation.  Labs on Admission: I have personally reviewed following labs and imaging studies  CBC: Recent Labs  Lab 05/27/23 0305 05/27/23 2047 05/28/23 0257 05/29/23 0316 05/30/23 0706 06/01/23 1819  WBC 6.2  --  6.0 5.2 4.9 3.3*  NEUTROABS  --   --   --   --   --  2.4  HGB 10.4* 10.2* 10.5* 11.2* 10.3* 10.4*  HCT 30.0* 29.4* 29.4* 32.2* 29.5* 30.2*  MCV 97.7  --  98.0 98.8 98.3 99.7  PLT 65*  --  78* 88* 96* 97*   Basic Metabolic Panel: Recent Labs  Lab 05/26/23 0228 05/27/23 0305 05/28/23 0257 05/28/23 1328 05/29/23 0316 05/30/23 0706 06/01/23 1819  NA 122* 126* 118* 125* 123* 124* 123*  K 3.8 3.8 3.7 3.6 3.8 3.7 3.4*  CL 87* 91* 86* 89* 88* 89* 87*  CO2 23 22 22 24 23 23 23   GLUCOSE 185* 121* 125* 223* 185* 145* 186*  BUN 72* 47* 71* 37* 52* 60* 51*  CREATININE 4.74* 3.52* 4.41* 2.89* 3.48* 4.25* 3.60*  CALCIUM 7.8* 8.0* 8.1* 8.2* 8.2* 8.5* 8.0*  MG 1.8 1.5* 2.1  --   --  1.8  --   PHOS 5.7* 3.8  5.0* 3.3  --  3.9 3.3   GFR: Estimated Creatinine Clearance: 13.6 mL/min (A) (by C-G formula based on SCr of  3.6 mg/dL (H)). Liver Function Tests: Recent Labs  Lab 05/28/23 1328 06/01/23 1819  AST  --  25  ALT  --  27  ALKPHOS  --  209*  BILITOT  --  1.6*  PROT  --  5.6*  ALBUMIN 2.3* 2.6*   No results for input(s): "LIPASE", "AMYLASE" in the last 168 hours. No results for input(s): "AMMONIA" in the last 168 hours. Coagulation Profile: Recent Labs  Lab 06/01/23 1819  INR 1.3*   Cardiac Enzymes: No results for input(s): "CKTOTAL", "CKMB", "CKMBINDEX", "TROPONINI" in the last 168 hours. BNP (last 3 results) No results for input(s): "PROBNP" in the last 8760 hours. HbA1C: No results for input(s): "HGBA1C" in the last 72 hours. CBG: Recent Labs  Lab 05/29/23 1127 05/29/23 1548 05/30/23 0621 05/30/23 1146 06/01/23 2344  GLUCAP 214* 130* 151* 107* 163*   Lipid Profile: No results for input(s): "CHOL", "HDL", "LDLCALC", "TRIG", "CHOLHDL", "LDLDIRECT" in the last 72 hours. Thyroid Function Tests: No results for input(s): "TSH", "T4TOTAL", "FREET4", "T3FREE", "THYROIDAB" in the last 72 hours. Anemia Panel: No results for input(s): "VITAMINB12", "FOLATE", "FERRITIN", "TIBC", "IRON", "RETICCTPCT" in the last 72 hours. Urine analysis:    Component Value Date/Time   COLORURINE STRAW (A) 07/10/2020 0814   APPEARANCEUR CLEAR 07/10/2020 0814   LABSPEC 1.010 07/10/2020 0814   PHURINE 6.0 07/10/2020 0814   GLUCOSEU 50 (A) 07/10/2020 0814   HGBUR SMALL (A) 07/10/2020 0814   BILIRUBINUR NEGATIVE 07/10/2020 0814   KETONESUR NEGATIVE 07/10/2020 0814   PROTEINUR >=300 (A) 07/10/2020 0814   UROBILINOGEN 0.2 04/17/2013 0828   NITRITE NEGATIVE 07/10/2020 0814   LEUKOCYTESUR NEGATIVE 07/10/2020 0814   Sepsis Labs: @LABRCNTIP (procalcitonin:4,lacticidven:4) ) Recent Results (from the past 240 hour(s))  Resp panel by RT-PCR (RSV, Flu A&B, Covid) Anterior Nasal Swab     Status: None   Collection Time: 06/01/23  7:14 PM   Specimen: Anterior Nasal Swab  Result Value Ref Range Status   SARS  Coronavirus 2 by RT PCR NEGATIVE NEGATIVE Final    Comment: (NOTE) SARS-CoV-2 target nucleic acids are NOT DETECTED.  The SARS-CoV-2 RNA is generally detectable in upper respiratory specimens during the acute phase of infection. The lowest concentration of SARS-CoV-2 viral copies this assay can detect is 138 copies/mL. A negative result does not preclude SARS-Cov-2 infection and should not be used as the sole basis for treatment or other patient management decisions. A negative result may occur with  improper specimen collection/handling, submission of specimen other than nasopharyngeal swab, presence of viral mutation(s) within the areas targeted by this assay, and inadequate number of viral copies(<138 copies/mL). A negative result must be combined with clinical observations, patient history, and epidemiological information. The expected result is Negative.  Fact Sheet for Patients:  BloggerCourse.com  Fact Sheet for Healthcare Providers:  SeriousBroker.it  This test is no t yet approved or cleared by the [place] FDA and  has been authorized for detection and/or diagnosis of SARS-CoV-2 by FDA under an Emergency Use Authorization (EUA). This EUA will remain  in effect (meaning this test can be used) for the duration of the COVID-19 declaration under Section 564(b)(1) of the Act, 21 U.S.C.section 360bbb-3(b)(1), unless the authorization is terminated  or revoked sooner.       Influenza A by PCR NEGATIVE NEGATIVE Final   Influenza B by PCR NEGATIVE NEGATIVE Final  Comment: (NOTE) The Xpert Xpress SARS-CoV-2/FLU/RSV plus assay is intended as an aid in the diagnosis of influenza from Nasopharyngeal swab specimens and should not be used as a sole basis for treatment. Nasal washings and aspirates are unacceptable for Xpert Xpress SARS-CoV-2/FLU/RSV testing.  Fact Sheet for  Patients: BloggerCourse.com  Fact Sheet for Healthcare Providers: SeriousBroker.it  This test is not yet approved or cleared by the [place] FDA and has been authorized for detection and/or diagnosis of SARS-CoV-2 by FDA under an Emergency Use Authorization (EUA). This EUA will remain in effect (meaning this test can be used) for the duration of the COVID-19 declaration under Section 564(b)(1) of the Act, 21 U.S.C. section 360bbb-3(b)(1), unless the authorization is terminated or revoked.     Resp Syncytial Virus by PCR NEGATIVE NEGATIVE Final    Comment: (NOTE) Fact Sheet for Patients: BloggerCourse.com  Fact Sheet for Healthcare Providers: SeriousBroker.it  This test is not yet approved or cleared by the [place] FDA and has been authorized for detection and/or diagnosis of SARS-CoV-2 by FDA under an Emergency Use Authorization (EUA). This EUA will remain in effect (meaning this test can be used) for the duration of the COVID-19 declaration under Section 564(b)(1) of the Act, 21 U.S.C. section 360bbb-3(b)(1), unless the authorization is terminated or revoked.  Performed at [facility], [address] [place], [place] [address]      Radiological Exams on Admission: DG Chest [facility] 1 View  Result Date: 06/01/2023 CLINICAL DATA:  Question of sepsis to evaluate for abnormality. Altered mental status. EXAM: PORTABLE CHEST 1 VIEW COMPARISON:  05/18/2023 FINDINGS: Shallow inspiration. Cardiac enlargement with mild perihilar infiltration, likely edema but could be pneumonia. No pleural effusions. No pneumothorax. Mediastinal contours appear intact. Calcification of the aorta. IMPRESSION: Cardiac enlargement with mild perihilar infiltration, progressing since prior study. Electronically Signed   By: Burman Nieves M.D.   On: 06/01/2023 19:06   CT Head Wo  Contrast  Result Date: 06/01/2023 CLINICAL DATA:  Mental status change of unknown cause. Elevated BUN. EXAM: CT HEAD WITHOUT CONTRAST TECHNIQUE: Contiguous axial images were obtained from the base of the skull through the vertex without intravenous contrast. RADIATION DOSE REDUCTION: This exam was performed according to the departmental dose-optimization program which includes automated exposure control, adjustment of the mA and/or kV according to patient size and/or use of iterative reconstruction technique. COMPARISON:  CT head 06/30/2022.  MRI brain 06/30/2022. FINDINGS: Brain: Mild diffuse cerebral atrophy. Low-attenuation changes in the deep white matter consistent with small vessel ischemia. No mass-effect or midline shift. No abnormal extra-axial fluid collections. Gray-white matter junctions are distinct. Basal cisterns are not effaced. No acute intracranial hemorrhage. Vascular: No hyperdense vessel or unexpected calcification. Skull: Normal. Negative for fracture or focal lesion. Sinuses/Orbits: No acute finding. Other: None. IMPRESSION: No acute intracranial abnormalities. Mild chronic atrophy and small vessel ischemic changes. Electronically Signed   By: Burman Nieves M.D.   On: 06/01/2023 19:05      Assessment/Plan Principal Problem:   HCAP (healthcare-associated pneumonia) Active Problems:   Acute respiratory failure with hypoxia (HCC)   Sepsis (HCC)   Acute metabolic encephalopathy   ESRD on dialysis (HCC)   HFrEF (heart failure with reduced ejection fraction) (HCC)   Hyponatremia   Electrolyte disturbance   Hypokalemia   Essential hypertension   Hyperlipidemia   Gout   Type II diabetes mellitus with renal manifestations (HCC)   Pancytopenia (HCC)   Atrial fibrillation, chronic (HCC)   Obesity (BMI 30-39.9)  Assessment and Plan:  Sepsis and acute respiratory failure with hypoxia due to HCAP: Patient has a 4 L of new oxygen requirement.  Chest x-ray showed mild  perihilar infiltration.  Patient meets criteria for sepsis with WBC 3.3, fever 100.4, heart rate up to 109.  Lactic acid 2.0 --> 1.5.    - Will admit to PCU as inpt - IV Vancomycin and cefepime - Mucinex for cough  - Bronchodilators - Urine legionella and S. pneumococcal antigen - Follow up blood culture x2, sputum culture - Check respiratory virus panel, plus Flu pcr - will get Procalcitonin and trend lactic acid level per sepsis protocol - IVF: 2L of NS bolus in ED, followed by 125 mL per hour of NS (patient has a diastolic congestive heart failure, limiting aggressive IV fluids treatment)           Acute metabolic encephalopathy   ESRD on dialysis (HCC)   HFrEF (heart failure with reduced ejection fraction) (HCC)   Hyponatremia   Electrolyte disturbance   Hypokalemia   Essential hypertension   Hyperlipidemia   Gout   Type II diabetes mellitus with renal manifestations (HCC)   Pancytopenia (HCC)   Atrial fibrillation, chronic (HCC)   Obesity (BMI 30-39.9)         DVT ppx: on SCD now  Code Status: Full code per his daughter  Family Communication:  Yes, patient's daughter by phone  Disposition Plan:  Anticipate discharge back to previous environment, SNF  Consults called: Dr. Rudi Rummage for renal  Admission status and Level of care: ICU:    as inpt      Dispo: The patient is from: SNF              Anticipated d/c is to: SNF              Anticipated d/c date is: 2 days              Patient currently is not medically stable to d/c.    Severity of Illness:  The appropriate patient status for this patient is INPATIENT. Inpatient status is judged to be reasonable and necessary in order to provide the required intensity of service to ensure the patient's safety. The patient's presenting symptoms, physical exam findings, and initial radiographic and laboratory data in the context of their chronic comorbidities is felt to place them at high risk for further  clinical deterioration. Furthermore, it is not anticipated that the patient will be medically stable for discharge from the hospital within 2 midnights of admission.   * I certify that at the point of admission it is my clinical judgment that the patient will require inpatient hospital care spanning beyond 2 midnights from the point of admission due to high intensity of service, high risk for further deterioration and high frequency of surveillance required.*       Date of Service 06/01/2023    Lorretta Harp [facility]   If 7PM-7AM, please contact night-coverage [email or web address] 06/01/2023, 11:56 PM

## 2023-06-01 NOTE — ED Triage Notes (Signed)
Pt arrives via EMS from [facility] he had labs today and his BUN was 92.2 but no proof of labs was sent with [facility]   [facility] states Pt has weeping edema, severe pitting edema in upper and lower extremities Unk when last dialsys was  Pt hasn't been feeling well today per family  Pt nurse said he had N/V and got zofran at 1530 and resolved Axillary 101.4, end tidal was 50  4L of O2 100%, wet lung sounds ST on [facility]  BP: 157/120, HR 103, no BG  Per [facility] Alert and orientec x4, but confused and lethargic at the momment

## 2023-06-01 NOTE — H&P (Incomplete)
History and Physical    JACKMAN CARRAHER UXL:[id number] DOB: [birth year] DOA: 06/01/2023  Referring MD/NP/PA:   PCP: Joycelyn Rua, MD   Patient coming from:  The patient is coming from SNF     Chief Complaint: fever and confusion  HPI: JEVAUN Lucas is a 82 y.o. male with medical history significant of ESRD on dialysis (MWF), sCHF with EF< 20%, A fib on Eliquis, HTN, HLD, DM, CAD, gout, BPH, anemia, Bell's palsy, obesity, OSA, former smoker, chronic hyponatremia, anemia and thrombocytopenia, who presents with fever and confusion.  Patient was recently hospitalized from 11/15 - 11/27 due to newly diagnosed A-fib. Patient was started on Eliquis and amiodarone. Now pt has AMS, not arousable and is not able to provide any medical history.  I called her daughter, who provided most of her history.   Per her daughter, at her normal baseline, patient is alert oriented x 3.  He uses walkers for walking. Today pt is found to be confused in SNF and has fever. His temperature is 102.4 in ED. Per her daughter pt has intermittent coughing for several months, with little mucus production.  Patient normally is not using oxygen at home, but today he is found to have oxygen desaturation to 80 percent on  RA, currently has 4L of new oxygen requirement in ED.  Per her daughter, patient had nausea and vomited once, no diarrhea and abdominal pain.  Does not seem to have chest pain. When I saw pt in ED. He is not arousable, not following command, minimally moves extremities on painful stimuli.  No facial droop noted.  No active nausea vomiting or diarrhea noted.  Patient had dialysis study.  Addendum: pt's initial Bp was 125/53, which has decreased to SBP 50s later when pt arrived to floor  Data reviewed independently and ED Course: pt was found to have hyponatremia with sodium 123, potassium 3.4, magnesium 1.8, phosphorus 3.3, bicarbonate 23, creatinine 3.60, BUN 51, pancytopenia with WBC 3.3, hemoglobin 10.4,  platelet 97 (recent WBC 4.9, hemoglobin 10.3, platelet 96 on 04/29/2023), negative PCR for COVID, lactic acid 2.0, --> 1.5, VBG with pH 7.33, CO2 56, O2 52, INR 1.3, PTT 49.  Temperature 100.4, blood pressure 125/53, heart rate 109, RR 16.  Chest x-ray showed cardiomegaly and mild perihilar obesity.  CT head negative.  Patient is admitted to PCU as inpatient.  Dr. Thedore Mins of renal is consulted.   EKG:  Not done in ED, will get one.     Review of Systems: Could not be reviewed due to altered mental status.  Allergy:  Allergies  Allergen Reactions  . Zestril [Lisinopril] Cough  . Hydrocodone Nausea Only  . Januvia [Sitagliptin] Other (See Comments)    Unknown  . Levitra [Vardenafil] Other (See Comments)    flushing  . Prednisone Other (See Comments)    Elevates BGL; patient prefers to not take this  . Victoza [Liraglutide] Other (See Comments)    GI upset  . Penicillins Rash and Other (See Comments)    Rash around ankles Has patient had a PCN reaction causing immediate rash, facial/tongue/throat swelling, SOB or lightheadedness with hypotension: Yes Has patient had a PCN reaction causing severe rash involving mucus membranes or skin necrosis: Unk Has patient had a PCN reaction that required hospitalization: No Has patient had a PCN reaction occurring within the last 10 years: No If all of the above answers are "NO", then may proceed with Cephalosporin use.     Past Medical History:  Diagnosis Date  . Arthritis   . CHF (congestive heart failure) (HCC)    HFrEF  . CKD (chronic kidney disease)    sees [place] in [place]  . Coronary artery disease   . Diabetes (HCC)    type 2   sees endo at [place] in [place]  . ESRD (end stage renal disease) on dialysis (HCC)    M,W,F  . Hypercholesteremia   . Hypertension   . Myocardial infarction (HCC) 1990  . Neuromuscular disorder (HCC)   . Pneumonia   . PONV (postoperative nausea and vomiting)     Past Surgical History:  Procedure  Laterality Date  . AV FISTULA PLACEMENT Left 05/17/2021   Procedure: LEFT ARM ARTERIOVENOUS (AV) FISTULA CREATION;  Surgeon: Victorino Sparrow, MD;  Location: [facility] OR;  Service: Vascular;  Laterality: Left;  PERIPHERAL NERVE BLOCK  . AV FISTULA PLACEMENT Left 05/02/2022   Procedure: INSERTION OF LEFT ARTERIOVENOUS (AV) GORE-TEX GRAFT;  Surgeon: Victorino Sparrow, MD;  Location: [facility] OR;  Service: Vascular;  Laterality: Left;  . BACK SURGERY  yrs ago   lower  . CARDIAC CATHETERIZATION    . CARDIOVERSION N/A 05/24/2023   Procedure: CARDIOVERSION (CATH LAB);  Surgeon: Jake Bathe, MD;  Location: [facility] INVASIVE CV LAB;  Service: Cardiovascular;  Laterality: N/A;  . CATARACT EXTRACTION Right 01/2021  . CHOLECYSTECTOMY N/A 05/02/2017   Procedure: LAPAROSCOPIC CHOLECYSTECTOMY;  Surgeon: Axel Filler, MD;  Location: [facility] OR;  Service: General;  Laterality: N/A;  . CORONARY ANGIOPLASTY     2 1990  . EYE SURGERY Left 03/2021   cataract removal  . IR FLUORO GUIDE CV LINE RIGHT  10/15/2021  . IR US GUIDE VASC ACCESS RIGHT  10/15/2021  . KNEE SURGERY Left yrs ago   arthroscopic  . LIGATION OF COMPETING BRANCHES OF ARTERIOVENOUS FISTULA Left 11/22/2021   Procedure: LEFT ARM FISTULA BRANCH LIGATION;  Surgeon: Victorino Sparrow, MD;  Location: [facility] OR;  Service: Vascular;  Laterality: Left;  . PERIPHERAL VASCULAR BALLOON ANGIOPLASTY  10/19/2021   Procedure: PERIPHERAL VASCULAR BALLOON ANGIOPLASTY;  Surgeon: Victorino Sparrow, MD;  Location: [facility] INVASIVE CV LAB;  Service: Cardiovascular;;  Left AVF  . ROTATOR CUFF REPAIR Left yrs ago  . SHOULDER OPEN ROTATOR CUFF REPAIR Right 04/23/2013   Procedure: RIGHT SHOULDER ROTATOR CUFF REPAIR WITH GRAFT AND ANCHORS ;  Surgeon: Jacki Cones, MD;  Location: [facility] ORS;  Service: Orthopedics;  Laterality: Right;  . TONSILLECTOMY    . TOTAL HIP ARTHROPLASTY Right 05/06/2019   Procedure: TOTAL HIP ARTHROPLASTY ANTERIOR APPROACH;  Surgeon: Durene Romans, MD;  Location: [facility] ORS;   Service: Orthopedics;  Laterality: Right;  70 mins  . TRANSESOPHAGEAL ECHOCARDIOGRAM (CATH LAB) N/A 05/24/2023   Procedure: TRANSESOPHAGEAL ECHOCARDIOGRAM;  Surgeon: Jake Bathe, MD;  Location: [facility] INVASIVE CV LAB;  Service: Cardiovascular;  Laterality: N/A;    Social History:  reports that he quit smoking about 34 years ago. His smoking use included cigars. He has never been exposed to tobacco smoke. He has never used smokeless tobacco. He reports that he does not currently use alcohol. He reports that he does not use drugs.  Family History:  Family History  Problem Relation Age of Onset  . Dementia Mother   . Stroke Brother      Prior to Admission medications   Medication Sig Start Date End Date Taking? Authorizing Provider  acetaminophen (TYLENOL) 650 MG CR tablet Take 1,300 mg by mouth in the morning, at noon, and at  bedtime.    [provider]  allopurinol (ZYLOPRIM) 100 MG tablet Take 100 mg by mouth in the morning.    [provider]  amiodarone (PACERONE) 200 MG tablet Take 1 tablet (200 mg total) by mouth daily. 05/31/23   Noralee Stain, DO  apixaban (ELIQUIS) 2.5 MG TABS tablet Take 1 tablet (2.5 mg total) by mouth 2 (two) times daily. 05/30/23   Noralee Stain, DO  atorvastatin (LIPITOR) 40 MG tablet Take 40 mg by mouth at bedtime.    [provider]  insulin aspart protamine - aspart (NOVOLOG MIX 70/30 FLEXPEN) (70-30) 100 UNIT/ML FlexPen Inject 14-20 Units into the skin daily as needed (high blood sugar). If BS is <150=0 units, If BS is 150-200 units=14 units, If BS is >201=20 units    [provider]  midodrine (PROAMATINE) 10 MG tablet Take 1 tablet (10 mg total) by mouth 3 (three) times daily with meals. 05/30/23   Noralee Stain, DO  tamsulosin (FLOMAX) 0.4 MG CAPS capsule Take 1 capsule (0.4 mg total) by mouth daily after supper. Patient taking differently: Take 0.4 mg by mouth at bedtime. 07/11/20   Albertine Grates, MD    Physical  Exam: Vitals:   06/01/23 1801 06/01/23 1802 06/01/23 1803 06/01/23 1930  BP: 129/67   (!) 125/53  Pulse: (!) 109   94  Resp: 12   16  Temp:  (!) 100.4 F (38 C)    TempSrc:  Axillary    SpO2: 100%   100%  Weight:   75.3 kg   Height:   5\' 2"  (1.575 m)    General: Not in acute distress HEENT:       Eyes: PERRL, EOMI, no jaundice       ENT: No discharge from the ears and nose       Neck: No JVD, no bruit, no mass felt. Heme: No neck lymph node enlargement. Cardiac: S1/S2, No murmurs, No gallops or rubs. Respiratory: Has fine crackles bilaterally GI: Soft, nondistended, nontender, no organomegaly, BS present. GU: No hematuria Ext: 2+ pitting leg edema bilaterally. 1+DP/PT pulse bilaterally. Musculoskeletal: No joint deformities, No joint redness or warmth, no limitation of ROM in spin. Skin: No rashes.  Neuro: Patient not arousable, not following command, cranial nerves II-XII grossly intact, minimally moves extremities on painful stimuli   Psych: Patient is not psychotic, no suicidal or hemocidal ideation.  Labs on Admission: I have personally reviewed following labs and imaging studies  CBC: Recent Labs  Lab 05/27/23 0305 05/27/23 2047 05/28/23 0257 05/29/23 0316 05/30/23 0706 06/01/23 1819  WBC 6.2  --  6.0 5.2 4.9 3.3*  NEUTROABS  --   --   --   --   --  2.4  HGB 10.4* 10.2* 10.5* 11.2* 10.3* 10.4*  HCT 30.0* 29.4* 29.4* 32.2* 29.5* 30.2*  MCV 97.7  --  98.0 98.8 98.3 99.7  PLT 65*  --  78* 88* 96* 97*   Basic Metabolic Panel: Recent Labs  Lab 05/26/23 0228 05/27/23 0305 05/28/23 0257 05/28/23 1328 05/29/23 0316 05/30/23 0706 06/01/23 1819  NA 122* 126* 118* 125* 123* 124* 123*  K 3.8 3.8 3.7 3.6 3.8 3.7 3.4*  CL 87* 91* 86* 89* 88* 89* 87*  CO2 23 22 22 24 23 23 23   GLUCOSE 185* 121* 125* 223* 185* 145* 186*  BUN 72* 47* 71* 37* 52* 60* 51*  CREATININE 4.74* 3.52* 4.41* 2.89* 3.48* 4.25* 3.60*  CALCIUM 7.8* 8.0* 8.1* 8.2* 8.2* 8.5* 8.0*  MG 1.8 1.5*  2.1  --   --  1.8  --   PHOS 5.7* 3.8 5.0* 3.3  --  3.9 3.3   GFR: Estimated Creatinine Clearance: 13.6 mL/min (A) (by C-G formula based on SCr of 3.6 mg/dL (H)). Liver Function Tests: Recent Labs  Lab 05/28/23 1328 06/01/23 1819  AST  --  25  ALT  --  27  ALKPHOS  --  209*  BILITOT  --  1.6*  PROT  --  5.6*  ALBUMIN 2.3* 2.6*   No results for input(s): "LIPASE", "AMYLASE" in the last 168 hours. No results for input(s): "AMMONIA" in the last 168 hours. Coagulation Profile: Recent Labs  Lab 06/01/23 1819  INR 1.3*   Cardiac Enzymes: No results for input(s): "CKTOTAL", "CKMB", "CKMBINDEX", "TROPONINI" in the last 168 hours. BNP (last 3 results) No results for input(s): "PROBNP" in the last 8760 hours. HbA1C: No results for input(s): "HGBA1C" in the last 72 hours. CBG: Recent Labs  Lab 05/29/23 1127 05/29/23 1548 05/30/23 0621 05/30/23 1146 06/01/23 2344  GLUCAP 214* 130* 151* 107* 163*   Lipid Profile: No results for input(s): "CHOL", "HDL", "LDLCALC", "TRIG", "CHOLHDL", "LDLDIRECT" in the last 72 hours. Thyroid Function Tests: No results for input(s): "TSH", "T4TOTAL", "FREET4", "T3FREE", "THYROIDAB" in the last 72 hours. Anemia Panel: No results for input(s): "VITAMINB12", "FOLATE", "FERRITIN", "TIBC", "IRON", "RETICCTPCT" in the last 72 hours. Urine analysis:    Component Value Date/Time   COLORURINE STRAW (A) 07/10/2020 0814   APPEARANCEUR CLEAR 07/10/2020 0814   LABSPEC 1.010 07/10/2020 0814   PHURINE 6.0 07/10/2020 0814   GLUCOSEU 50 (A) 07/10/2020 0814   HGBUR SMALL (A) 07/10/2020 0814   BILIRUBINUR NEGATIVE 07/10/2020 0814   KETONESUR NEGATIVE 07/10/2020 0814   PROTEINUR >=300 (A) 07/10/2020 0814   UROBILINOGEN 0.2 04/17/2013 0828   NITRITE NEGATIVE 07/10/2020 0814   LEUKOCYTESUR NEGATIVE 07/10/2020 0814   Sepsis Labs: @LABRCNTIP (procalcitonin:4,lacticidven:4) ) Recent Results (from the past 240 hour(s))  Resp panel by RT-PCR (RSV, Flu A&B,  Covid) Anterior Nasal Swab     Status: None   Collection Time: 06/01/23  7:14 PM   Specimen: Anterior Nasal Swab  Result Value Ref Range Status   SARS Coronavirus 2 by RT PCR NEGATIVE NEGATIVE Final    Comment: (NOTE) SARS-CoV-2 target nucleic acids are NOT DETECTED.  The SARS-CoV-2 RNA is generally detectable in upper respiratory specimens during the acute phase of infection. The lowest concentration of SARS-CoV-2 viral copies this assay can detect is 138 copies/mL. A negative result does not preclude SARS-Cov-2 infection and should not be used as the sole basis for treatment or other patient management decisions. A negative result may occur with  improper specimen collection/handling, submission of specimen other than nasopharyngeal swab, presence of viral mutation(s) within the areas targeted by this assay, and inadequate number of viral copies(<138 copies/mL). A negative result must be combined with clinical observations, patient history, and epidemiological information. The expected result is Negative.  Fact Sheet for Patients:  BloggerCourse.com  Fact Sheet for Healthcare Providers:  SeriousBroker.it  This test is no t yet approved or cleared by the [place] FDA and  has been authorized for detection and/or diagnosis of SARS-CoV-2 by FDA under an Emergency Use Authorization (EUA). This EUA will remain  in effect (meaning this test can be used) for the duration of the COVID-19 declaration under Section 564(b)(1) of the Act, 21 U.S.C.section 360bbb-3(b)(1), unless the authorization is terminated  or revoked sooner.  Influenza A by PCR NEGATIVE NEGATIVE Final   Influenza B by PCR NEGATIVE NEGATIVE Final    Comment: (NOTE) The Xpert Xpress SARS-CoV-2/FLU/RSV plus assay is intended as an aid in the diagnosis of influenza from Nasopharyngeal swab specimens and should not be used as a sole basis for treatment. Nasal  washings and aspirates are unacceptable for Xpert Xpress SARS-CoV-2/FLU/RSV testing.  Fact Sheet for Patients: BloggerCourse.com  Fact Sheet for Healthcare Providers: SeriousBroker.it  This test is not yet approved or cleared by the [place] FDA and has been authorized for detection and/or diagnosis of SARS-CoV-2 by FDA under an Emergency Use Authorization (EUA). This EUA will remain in effect (meaning this test can be used) for the duration of the COVID-19 declaration under Section 564(b)(1) of the Act, 21 U.S.C. section 360bbb-3(b)(1), unless the authorization is terminated or revoked.     Resp Syncytial Virus by PCR NEGATIVE NEGATIVE Final    Comment: (NOTE) Fact Sheet for Patients: BloggerCourse.com  Fact Sheet for Healthcare Providers: SeriousBroker.it  This test is not yet approved or cleared by the [place] FDA and has been authorized for detection and/or diagnosis of SARS-CoV-2 by FDA under an Emergency Use Authorization (EUA). This EUA will remain in effect (meaning this test can be used) for the duration of the COVID-19 declaration under Section 564(b)(1) of the Act, 21 U.S.C. section 360bbb-3(b)(1), unless the authorization is terminated or revoked.  Performed at [facility], [address], [place], [place] [address]      Radiological Exams on Admission: DG Chest [facility] 1 View  Result Date: 06/01/2023 CLINICAL DATA:  Question of sepsis to evaluate for abnormality. Altered mental status. EXAM: PORTABLE CHEST 1 VIEW COMPARISON:  05/18/2023 FINDINGS: Shallow inspiration. Cardiac enlargement with mild perihilar infiltration, likely edema but could be pneumonia. No pleural effusions. No pneumothorax. Mediastinal contours appear intact. Calcification of the aorta. IMPRESSION: Cardiac enlargement with mild perihilar infiltration, progressing since prior  study. Electronically Signed   By: Burman Nieves M.D.   On: 06/01/2023 19:06   CT Head Wo Contrast  Result Date: 06/01/2023 CLINICAL DATA:  Mental status change of unknown cause. Elevated BUN. EXAM: CT HEAD WITHOUT CONTRAST TECHNIQUE: Contiguous axial images were obtained from the base of the skull through the vertex without intravenous contrast. RADIATION DOSE REDUCTION: This exam was performed according to the departmental dose-optimization program which includes automated exposure control, adjustment of the mA and/or kV according to patient size and/or use of iterative reconstruction technique. COMPARISON:  CT head 06/30/2022.  MRI brain 06/30/2022. FINDINGS: Brain: Mild diffuse cerebral atrophy. Low-attenuation changes in the deep white matter consistent with small vessel ischemia. No mass-effect or midline shift. No abnormal extra-axial fluid collections. Gray-white matter junctions are distinct. Basal cisterns are not effaced. No acute intracranial hemorrhage. Vascular: No hyperdense vessel or unexpected calcification. Skull: Normal. Negative for fracture or focal lesion. Sinuses/Orbits: No acute finding. Other: None. IMPRESSION: No acute intracranial abnormalities. Mild chronic atrophy and small vessel ischemic changes. Electronically Signed   By: Burman Nieves M.D.   On: 06/01/2023 19:05      Assessment/Plan Principal Problem:   HCAP (healthcare-associated pneumonia) Active Problems:   Acute respiratory failure with hypoxia (HCC)   Severe sepsis with septic shock (CODE) (HCC)   Acute metabolic encephalopathy   ESRD on dialysis (HCC)   HFrEF (heart failure with reduced ejection fraction) (HCC)   Hyponatremia   Electrolyte disturbance   Hypokalemia   Essential hypertension   Hyperlipidemia   Gout  Type II diabetes mellitus with renal manifestations (HCC)   Pancytopenia (HCC)   Atrial fibrillation, chronic (HCC)   Obesity (BMI 30-39.9)   Assessment and Plan:  Severe sepsis  with septic shock and acute respiratory failure with hypoxia due to HCAP: Patient has a 4 L of new oxygen requirement.  Chest x-ray showed mild perihilar infiltration.  Patient meets criteria for sepsis with WBC 3.3, fever 100.4, heart rate up to 109.  Lactic acid 2.0 --> 1.5.  Pt's initial Bp was 125/53, which has decreased to SBP 50s later when pt arrived to floor. Started Levophed infusion. Consulted ICU APP, Farrel Gobble. Pt will be transferred to ICU.  - Will admit to PCU as inpt --> transfer to ICU - IV Vancomycin and cefepime - Mucinex for cough when able to take orally - Bronchodilators - Urine legionella and S. pneumococcal antigen - Follow up blood culture x2, sputum culture - will get Procalcitonin - IVF: will not give IVF due to ESRD and fluid overload now           Acute metabolic encephalopathy   ESRD on dialysis (HCC)   HFrEF (heart failure with reduced ejection fraction) (HCC)   Hyponatremia   Electrolyte disturbance   Hypokalemia   Essential hypertension   Hyperlipidemia   Gout   Type II diabetes mellitus with renal manifestations (HCC)   Pancytopenia (HCC)   Atrial fibrillation, chronic (HCC)   Obesity (BMI 30-39.9)     CRITICAL CARE Performed by: Lorretta Harp   Total critical care time:  70 minutes  Critical care time was exclusive of separately billable procedures and treating other patients.  Critical care was necessary to treat or prevent imminent or life-threatening deterioration.  Critical care was time spent personally by me on the following activities: development of treatment plan with patient and/or surrogate as well as nursing, discussions with consultants, evaluation of patient's response to treatment, examination of patient, obtaining history from patient or surrogate, ordering and performing treatments and interventions, ordering and review of laboratory studies, ordering and review of radiographic studies, pulse oximetry and re-evaluation  of patient's condition.         DVT ppx: on SCD now  Code Status: Full code per his daughter  Family Communication:  Yes, patient's daughter by phone  Disposition Plan:  Anticipate discharge back to previous environment, SNF  Consults called: Dr. Rudi Rummage for renal  Admission status and Level of care: ICU:    as inpt      Dispo: The patient is from: SNF              Anticipated d/c is to: SNF              Anticipated d/c date is: 2 days              Patient currently is not medically stable to d/c.    Severity of Illness:  The appropriate patient status for this patient is INPATIENT. Inpatient status is judged to be reasonable and necessary in order to provide the required intensity of service to ensure the patient's safety. The patient's presenting symptoms, physical exam findings, and initial radiographic and laboratory data in the context of their chronic comorbidities is felt to place them at high risk for further clinical deterioration. Furthermore, it is not anticipated that the patient will be medically stable for discharge from the hospital within 2 midnights of admission.   * I certify that at the point of admission it is my  clinical judgment that the patient will require inpatient hospital care spanning beyond 2 midnights from the point of admission due to high intensity of service, high risk for further deterioration and high frequency of surveillance required.*       Date of Service 06/01/2023    Lorretta Harp [facility]   If 7PM-7AM, please contact night-coverage [email or web address] 06/01/2023, 11:58 PM

## 2023-06-01 NOTE — Progress Notes (Signed)
Elink following code sepsis °

## 2023-06-01 NOTE — ED Provider Notes (Signed)
[facility] Provider Note    Event Date/Time   First MD Initiated Contact with Patient 06/01/23 1754     (approximate)   History   Altered Mental Status   HPI  Robert Lucas is a 82 year old male with history of ESRD on HD, T2DM, CHF with EF of 30 to 35%, CAD, HTN presenting to the emergency department for evaluation of altered mental status.  History obtained from daughter and wife who presented to bedside.  They report that about an hour prior to presentation they were with the patient and noticed that he seemed mildly confused and was reporting nausea.  They told a facility worker who gave him Zofran and said that they would keep checking on him.  Shortly after they received a call that patient was being transported to the ER.  He was recently admitted at [facility], report that he did not have any issues with confusion during that hospitalization.  EMS reports that patient was confused and hypoxic on their presentation.  He was on 2 L when they arrived satting 80%, was increased to 4 L.  Also noted to be febrile.  Family reports the patient did complete his dialysis session today.  I did review his recent discharge summary from 05/30/2023.  Patient presented with hypotension and tachycardia.  He was found to be in A-fib with RVR with respiratory failure requiring 4 L nasal cannula.  No acute infection identified.  He was cardioverted successfully and placed on Eliquis.     Physical Exam   Triage Vital Signs: ED Triage Vitals  Encounter Vitals Group     BP 06/01/23 1801 129/67     Systolic BP Percentile --      Diastolic BP Percentile --      Pulse Rate 06/01/23 1801 (!) 109     Resp 06/01/23 1801 12     Temp 06/01/23 1802 (!) 100.4 F (38 C)     Temp Source 06/01/23 1802 Axillary     SpO2 06/01/23 1759 100 %     Weight 06/01/23 1803 166 lb (75.3 kg)     Height 06/01/23 1803 5\' 2"  (1.575 m)     Head Circumference --      Peak Flow --      Pain  Score --      Pain Loc --      Pain Education --      Exclude from Growth Chart --     Most recent vital signs: Vitals:   06/01/23 1802 06/01/23 1930  BP:  (!) 125/53  Pulse:  94  Resp:  16  Temp: (!) 100.4 F (38 C)   SpO2:  100%     General: Somnolent, arouses to voice for a few seconds and tracks, but then falls back asleep CV:  Tachycardia with regular rhythm, extensive edema of the bilateral lower extremities as well as the upper extremities, family reports greater than usual edema Resp:  Respirations not significantly labored, diminished over bilateral bases Abd:  Nondistended.  Nontender. Neuro:  Somnolent but arousable, able to state his name and birthday for radiology, tracks when awake, following some commands, significant generalized weakness, no appreciable unilateral findings, pupils equal and reactive   ED Results / Procedures / Treatments   Labs (all labs ordered are listed, but only abnormal results are displayed) Labs Reviewed  CBC WITH DIFFERENTIAL/PLATELET - Abnormal; Notable for the following components:      Result Value   WBC 3.3 (*)  RBC 3.03 (*)    Hemoglobin 10.4 (*)    HCT 30.2 (*)    MCH 34.3 (*)    RDW 17.0 (*)    Platelets 97 (*)    Lymphs Abs 0.6 (*)    All other components within normal limits  COMPREHENSIVE METABOLIC PANEL - Abnormal; Notable for the following components:   Sodium 123 (*)    Potassium 3.4 (*)    Chloride 87 (*)    Glucose, Bld 186 (*)    BUN 51 (*)    Creatinine, Ser 3.60 (*)    Calcium 8.0 (*)    Total Protein 5.6 (*)    Albumin 2.6 (*)    Alkaline Phosphatase 209 (*)    Total Bilirubin 1.6 (*)    GFR, Estimated 16 (*)    All other components within normal limits  LACTIC ACID, PLASMA - Abnormal; Notable for the following components:   Lactic Acid, Venous 2.0 (*)    All other components within normal limits  PROTIME-INR - Abnormal; Notable for the following components:   Prothrombin Time 16.5 (*)    INR 1.3  (*)    All other components within normal limits  APTT - Abnormal; Notable for the following components:   aPTT 49 (*)    All other components within normal limits  CULTURE, BLOOD (ROUTINE X 2)  CULTURE, BLOOD (ROUTINE X 2)  RESP PANEL BY RT-PCR (RSV, FLU A&B, COVID)  [id number]  LACTIC ACID, PLASMA  BLOOD GAS, VENOUS  PHOSPHORUS  OSMOLALITY  OSMOLALITY, URINE  SODIUM, URINE, RANDOM     EKG EKG independently reviewed interpreted by myself (ER attending) demonstrates:    RADIOLOGY Imaging independently reviewed and interpreted by myself demonstrates:  CT head without acute bleed on my review Chest x-Nargis Abrams with questionable right basilar consolidation  PROCEDURES:  Critical Care performed: Yes, see critical care procedure note(s)  CRITICAL CARE Performed by: Trinna Post   Total critical care time: 34 minutes  Critical care time was exclusive of separately billable procedures and treating other patients.  Critical care was necessary to treat or prevent imminent or life-threatening deterioration.  Critical care was time spent personally by me on the following activities: development of treatment plan with patient and/or surrogate as well as nursing, discussions with consultants, evaluation of patient's response to treatment, examination of patient, obtaining history from patient or surrogate, ordering and performing treatments and interventions, ordering and review of laboratory studies, ordering and review of radiographic studies, pulse oximetry and re-evaluation of patient's condition.   Procedures   MEDICATIONS ORDERED IN ED: Medications  acetaminophen (OFIRMEV) IV 1,000 mg (has no administration in time range)  albuterol (PROVENTIL) (2.5 MG/3ML) 0.083% nebulizer solution 2.5 mg (has no administration in time range)  dextromethorphan-guaiFENesin (MUCINEX DM) 30-600 MG per 12 hr tablet 1 tablet (has no administration in time range)  ondansetron (ZOFRAN) injection 4 mg (has no  administration in time range)  hydrALAZINE (APRESOLINE) injection 5 mg (has no administration in time range)  acetaminophen (TYLENOL) tablet 650 mg (has no administration in time range)  sodium chloride tablet 1 g (has no administration in time range)  insulin aspart (novoLOG) injection 0-5 Units (has no administration in time range)  insulin aspart (novoLOG) injection 0-9 Units (has no administration in time range)     IMPRESSION / MDM / ASSESSMENT AND PLAN / ED COURSE  I reviewed the triage vital signs and the nursing notes.  Differential diagnosis includes, but is not limited to, pneumonia, UTI,  other acute infectious process, hypercarbia, acute intracranial bleed, other acute intracranial process, anemia, electrolyte abnormality  Patient's presentation is most consistent with acute presentation with potential threat to life or bodily function.  82 year old male presenting to the emergency department for evaluation of altered mental status.  Febrile and tachycardic on presentation.  Sepsis orders initiated with empiric Rocephin and azithromycin for suspected pneumonia in the setting of new onset hypoxia.  Extensive edema of the extremities, will hold off on fluids for now.  Ordered for empiric Rocephin and azithromycin.  Labs with leukopenia, elevated lactate at 2.  Hyponatremic at 123, but not significantly changed from recent admission.  Elevated BUN and creatinine consistent with known history of ESRD, no hyperkalemia.  CT head without acute findings.  X-Tiara Bartoli with findings concerning for edema versus infection.  Concern for volume overload, but I am also concerned about infection.  Do think patient is appropriate for admission for further evaluation.  Will reach out to hospitalist team.  Case reviewed with Dr. Clyde Lundborg.  He will evaluate the patient for anticipated admission.    FINAL CLINICAL IMPRESSION(S) / ED DIAGNOSES   Final diagnoses:  Pneumonia of right lung due to infectious organism,  unspecified part of lung  Acute encephalopathy     Rx / DC Orders   ED Discharge Orders     None        Note:  This document was prepared using Dragon voice recognition software and may include unintentional dictation errors.   Trinna Post, MD 06/01/23 2013

## 2023-06-01 NOTE — ED Notes (Signed)
Pt cleaned up and had one BM. Placed sacral foam dressing on Pt. Pt repositioned.

## 2023-06-02 ENCOUNTER — Encounter: Admission: EM | Disposition: E | Payer: Self-pay | Source: Skilled Nursing Facility | Attending: Pulmonary Disease

## 2023-06-02 ENCOUNTER — Inpatient Hospital Stay: Payer: Medicare Other

## 2023-06-02 ENCOUNTER — Encounter: Payer: Self-pay | Admitting: General Practice

## 2023-06-02 DIAGNOSIS — E1129 Type 2 diabetes mellitus with other diabetic kidney complication: Secondary | ICD-10-CM | POA: Diagnosis not present

## 2023-06-02 DIAGNOSIS — Z992 Dependence on renal dialysis: Secondary | ICD-10-CM | POA: Diagnosis not present

## 2023-06-02 DIAGNOSIS — J189 Pneumonia, unspecified organism: Secondary | ICD-10-CM

## 2023-06-02 DIAGNOSIS — N186 End stage renal disease: Secondary | ICD-10-CM | POA: Diagnosis not present

## 2023-06-02 LAB — SEDIMENTATION RATE: Sed Rate: 53 mm/h — ABNORMAL HIGH (ref 0–20)

## 2023-06-02 LAB — BLOOD GAS, ARTERIAL
Acid-base deficit: 2.5 mmol/L — ABNORMAL HIGH (ref 0.0–2.0)
Acid-base deficit: 3.3 mmol/L — ABNORMAL HIGH (ref 0.0–2.0)
Acid-base deficit: 4.9 mmol/L — ABNORMAL HIGH (ref 0.0–2.0)
Bicarbonate: 22 mmol/L (ref 20.0–28.0)
Bicarbonate: 24.2 mmol/L (ref 20.0–28.0)
Bicarbonate: 24.3 mmol/L (ref 20.0–28.0)
Delivery systems: POSITIVE
Delivery systems: POSITIVE
Expiratory PAP: 8 cm[H2O]
Expiratory PAP: 8 cm[H2O]
FIO2: 35 %
FIO2: 35 %
Inspiratory PAP: 12 cm[H2O]
Inspiratory PAP: 14 cm[H2O]
O2 Saturation: 100 %
O2 Saturation: 84.7 %
O2 Saturation: 99.1 %
Patient temperature: 37
Patient temperature: 37
Patient temperature: 37
pCO2 arterial: 48 mm[Hg] (ref 32–48)
pCO2 arterial: 48 mm[Hg] (ref 32–48)
pCO2 arterial: 53 mm[Hg] — ABNORMAL HIGH (ref 32–48)
pH, Arterial: 7.27 — ABNORMAL LOW (ref 7.35–7.45)
pH, Arterial: 7.27 — ABNORMAL LOW (ref 7.35–7.45)
pH, Arterial: 7.31 — ABNORMAL LOW (ref 7.35–7.45)
pO2, Arterial: 116 mm[Hg] — ABNORMAL HIGH (ref 83–108)
pO2, Arterial: 134 mm[Hg] — ABNORMAL HIGH (ref 83–108)
pO2, Arterial: 48 mm[Hg] — ABNORMAL LOW (ref 83–108)

## 2023-06-02 LAB — CBC
HCT: 27.9 % — ABNORMAL LOW (ref 39.0–52.0)
Hemoglobin: 9.7 g/dL — ABNORMAL LOW (ref 13.0–17.0)
MCH: 34.2 pg — ABNORMAL HIGH (ref 26.0–34.0)
MCHC: 34.8 g/dL (ref 30.0–36.0)
MCV: 98.2 fL (ref 80.0–100.0)
Platelets: 100 10*3/uL — ABNORMAL LOW (ref 150–400)
RBC: 2.84 MIL/uL — ABNORMAL LOW (ref 4.22–5.81)
RDW: 17 % — ABNORMAL HIGH (ref 11.5–15.5)
WBC: 7.2 10*3/uL (ref 4.0–10.5)
nRBC: 0 % (ref 0.0–0.2)

## 2023-06-02 LAB — GLUCOSE, CAPILLARY
Glucose-Capillary: 129 mg/dL — ABNORMAL HIGH (ref 70–99)
Glucose-Capillary: 145 mg/dL — ABNORMAL HIGH (ref 70–99)
Glucose-Capillary: 170 mg/dL — ABNORMAL HIGH (ref 70–99)
Glucose-Capillary: 194 mg/dL — ABNORMAL HIGH (ref 70–99)
Glucose-Capillary: 199 mg/dL — ABNORMAL HIGH (ref 70–99)
Glucose-Capillary: 204 mg/dL — ABNORMAL HIGH (ref 70–99)

## 2023-06-02 LAB — BLOOD CULTURE ID PANEL (REFLEXED) - BCID2

## 2023-06-02 LAB — BASIC METABOLIC PANEL
Anion gap: 12 (ref 5–15)
Anion gap: 13 (ref 5–15)
Anion gap: 14 (ref 5–15)
BUN: 32 mg/dL — ABNORMAL HIGH (ref 8–23)
BUN: 59 mg/dL — ABNORMAL HIGH (ref 8–23)
BUN: 68 mg/dL — ABNORMAL HIGH (ref 8–23)
CO2: 20 mmol/L — ABNORMAL LOW (ref 22–32)
CO2: 22 mmol/L (ref 22–32)
CO2: 24 mmol/L (ref 22–32)
Calcium: 7.4 mg/dL — ABNORMAL LOW (ref 8.9–10.3)
Calcium: 8 mg/dL — ABNORMAL LOW (ref 8.9–10.3)
Calcium: 8.1 mg/dL — ABNORMAL LOW (ref 8.9–10.3)
Chloride: 86 mmol/L — ABNORMAL LOW (ref 98–111)
Chloride: 89 mmol/L — ABNORMAL LOW (ref 98–111)
Chloride: 92 mmol/L — ABNORMAL LOW (ref 98–111)
Creatinine, Ser: 2.27 mg/dL — ABNORMAL HIGH (ref 0.61–1.24)
Creatinine, Ser: 3.86 mg/dL — ABNORMAL HIGH (ref 0.61–1.24)
Creatinine, Ser: 4.31 mg/dL — ABNORMAL HIGH (ref 0.61–1.24)
GFR, Estimated: 13 mL/min — ABNORMAL LOW (ref >60)
GFR, Estimated: 15 mL/min — ABNORMAL LOW (ref >60)
GFR, Estimated: 28 mL/min — ABNORMAL LOW (ref >60)
Glucose, Bld: 173 mg/dL — ABNORMAL HIGH (ref 70–99)
Glucose, Bld: 192 mg/dL — ABNORMAL HIGH (ref 70–99)
Glucose, Bld: 206 mg/dL — ABNORMAL HIGH (ref 70–99)
Potassium: 3.2 mmol/L — ABNORMAL LOW (ref 3.5–5.1)
Potassium: 3.3 mmol/L — ABNORMAL LOW (ref 3.5–5.1)
Potassium: 3.9 mmol/L (ref 3.5–5.1)
Sodium: 121 mmol/L — ABNORMAL LOW (ref 135–145)
Sodium: 123 mmol/L — ABNORMAL LOW (ref 135–145)
Sodium: 127 mmol/L — ABNORMAL LOW (ref 135–145)

## 2023-06-02 LAB — COMPREHENSIVE METABOLIC PANEL
ALT: 24 U/L (ref 0–44)
AST: 29 U/L (ref 15–41)
Albumin: 2.3 g/dL — ABNORMAL LOW (ref 3.5–5.0)
Alkaline Phosphatase: 187 U/L — ABNORMAL HIGH (ref 38–126)
Anion gap: 14 (ref 5–15)
BUN: 59 mg/dL — ABNORMAL HIGH (ref 8–23)
CO2: 21 mmol/L — ABNORMAL LOW (ref 22–32)
Calcium: 7.4 mg/dL — ABNORMAL LOW (ref 8.9–10.3)
Chloride: 86 mmol/L — ABNORMAL LOW (ref 98–111)
Creatinine, Ser: 3.81 mg/dL — ABNORMAL HIGH (ref 0.61–1.24)
GFR, Estimated: 15 mL/min — ABNORMAL LOW (ref >60)
Glucose, Bld: 206 mg/dL — ABNORMAL HIGH (ref 70–99)
Potassium: 3.5 mmol/L (ref 3.5–5.1)
Sodium: 121 mmol/L — ABNORMAL LOW (ref 135–145)
Total Bilirubin: 1.6 mg/dL — ABNORMAL HIGH (ref <1.2)
Total Protein: 5.1 g/dL — ABNORMAL LOW (ref 6.5–8.1)

## 2023-06-02 LAB — APTT
aPTT: 66 s — ABNORMAL HIGH (ref 24–36)
aPTT: 62 s — ABNORMAL HIGH (ref 24–36)

## 2023-06-02 LAB — C-REACTIVE PROTEIN: CRP: 13.7 mg/dL — ABNORMAL HIGH (ref <1.0)

## 2023-06-02 LAB — TSH: TSH: 2.406 u[IU]/mL (ref 0.350–4.500)

## 2023-06-02 LAB — PHOSPHORUS: Phosphorus: 4.8 mg/dL — ABNORMAL HIGH (ref 2.5–4.6)

## 2023-06-02 LAB — HEPARIN LEVEL (UNFRACTIONATED): Heparin Unfractionated: >1.1 [IU]/mL — ABNORMAL HIGH (ref 0.30–0.70)

## 2023-06-02 LAB — OSMOLALITY: Osmolality: 279 mosm/kg (ref 275–295)

## 2023-06-02 LAB — MAGNESIUM: Magnesium: 1.7 mg/dL (ref 1.7–2.4)

## 2023-06-02 LAB — MRSA NEXT GEN BY PCR, NASAL: MRSA by PCR Next Gen: NOT DETECTED

## 2023-06-02 LAB — PROCALCITONIN: Procalcitonin: 5.17 ng/mL

## 2023-06-02 LAB — AMMONIA: Ammonia: 32 umol/L (ref 9–35)

## 2023-06-02 SURGERY — REMOVAL OF ARTERIOVENOUS GORETEX GRAFT (AVGG)
Anesthesia: Choice

## 2023-06-02 MED ORDER — ALLOPURINOL 100 MG PO TABS
100.0000 mg | ORAL_TABLET | Freq: Every day | ORAL | Status: DC
  Administered 2023-06-02 – 2023-06-05 (×2): 100 mg via ORAL
  Filled 2023-06-02 (×5): qty 1

## 2023-06-02 MED ORDER — MIDODRINE HCL 5 MG PO TABS
10.0000 mg | ORAL_TABLET | Freq: Three times a day (TID) | ORAL | Status: DC
  Administered 2023-06-02 – 2023-06-06 (×9): 10 mg via ORAL
  Filled 2023-06-02 (×8): qty 2

## 2023-06-02 MED ORDER — CALCIUM GLUCONATE-NACL 2-0.675 GM/100ML-% IV SOLN
2.0000 g | Freq: Once | INTRAVENOUS | Status: AC
  Administered 2023-06-02: 2000 mg via INTRAVENOUS
  Filled 2023-06-02: qty 100

## 2023-06-02 MED ORDER — HEPARIN SODIUM (PORCINE) 5000 UNIT/ML IJ SOLN
INTRAMUSCULAR | Status: AC
  Filled 2023-06-02: qty 1

## 2023-06-02 MED ORDER — HEPARIN BOLUS VIA INFUSION
1000.0000 [IU] | Freq: Once | INTRAVENOUS | Status: AC
  Administered 2023-06-02: 1000 [IU] via INTRAVENOUS
  Filled 2023-06-02: qty 1000

## 2023-06-02 MED ORDER — ORAL CARE MOUTH RINSE: 15.0000 mL | OROMUCOSAL | Status: DC | PRN

## 2023-06-02 MED ORDER — NOREPINEPHRINE 4 MG/250ML-% IV SOLN
2.0000 ug/min | INTRAVENOUS | Status: DC
  Administered 2023-06-02: 7 ug/min via INTRAVENOUS

## 2023-06-02 MED ORDER — SODIUM CHLORIDE 0.9% FLUSH
3.0000 mL | Freq: Two times a day (BID) | INTRAVENOUS | Status: DC
  Administered 2023-06-02 – 2023-06-05 (×6): 3 mL via INTRAVENOUS

## 2023-06-02 MED ORDER — ATORVASTATIN CALCIUM 20 MG PO TABS
40.0000 mg | ORAL_TABLET | Freq: Every day | ORAL | Status: DC
  Administered 2023-06-02 – 2023-06-05 (×3): 40 mg via ORAL
  Filled 2023-06-02 (×3): qty 2

## 2023-06-02 MED ORDER — MAGNESIUM SULFATE 2 GM/50ML IV SOLN
2.0000 g | Freq: Once | INTRAVENOUS | Status: AC
Start: 2023-06-02 — End: 2023-06-02
  Administered 2023-06-02: 2 g via INTRAVENOUS
  Filled 2023-06-02: qty 50

## 2023-06-02 MED ORDER — HEPARIN (PORCINE) 25000 UT/250ML-% IV SOLN
1150.0000 [IU]/h | INTRAVENOUS | Status: DC
  Administered 2023-06-02: 1000 [IU]/h via INTRAVENOUS
  Administered 2023-06-03: 1150 [IU]/h via INTRAVENOUS
  Filled 2023-06-02: qty 250

## 2023-06-02 MED ORDER — APIXABAN 2.5 MG PO TABS: 2.5000 mg | ORAL_TABLET | Freq: Two times a day (BID) | ORAL | Status: DC

## 2023-06-02 MED ORDER — VANCOMYCIN HCL 750 MG/150ML IV SOLN
750.0000 mg | Freq: Once | INTRAVENOUS | Status: AC
  Administered 2023-06-02: 750 mg via INTRAVENOUS
  Filled 2023-06-02: qty 150

## 2023-06-02 MED ORDER — ORAL CARE MOUTH RINSE
15.0000 mL | OROMUCOSAL | Status: DC
  Administered 2023-06-02 – 2023-06-06 (×16): 15 mL via OROMUCOSAL

## 2023-06-02 MED ORDER — BUPIVACAINE-EPINEPHRINE (PF) 0.5% -1:200000 IJ SOLN
INTRAMUSCULAR | Status: AC
  Filled 2023-06-02: qty 30

## 2023-06-02 MED ORDER — CHLORHEXIDINE GLUCONATE CLOTH 2 % EX PADS
6.0000 | MEDICATED_PAD | Freq: Every day | CUTANEOUS | Status: DC
  Administered 2023-06-03 – 2023-06-04 (×2): 6 via TOPICAL

## 2023-06-02 MED ORDER — HYDROCORTISONE SOD SUC (PF) 100 MG IJ SOLR
100.0000 mg | Freq: Two times a day (BID) | INTRAMUSCULAR | Status: AC
  Administered 2023-06-02 – 2023-06-03 (×4): 100 mg via INTRAVENOUS
  Filled 2023-06-02 (×4): qty 2

## 2023-06-02 MED ORDER — SODIUM CHLORIDE 0.9% FLUSH: 10.0000 mL | INTRAVENOUS | Status: DC | PRN

## 2023-06-02 MED ORDER — HEPARIN SODIUM (PORCINE) 1000 UNIT/ML DIALYSIS: 1000.0000 [IU] | INTRAMUSCULAR | Status: DC | PRN

## 2023-06-02 MED ORDER — ALBUMIN HUMAN 25 % IV SOLN
25.0000 g | Freq: Once | INTRAVENOUS | Status: AC
  Administered 2023-06-02: 25 g via INTRAVENOUS
  Filled 2023-06-02: qty 100

## 2023-06-02 MED ORDER — CHLORHEXIDINE GLUCONATE CLOTH 2 % EX PADS
6.0000 | MEDICATED_PAD | Freq: Every day | CUTANEOUS | Status: DC
  Administered 2023-06-02 – 2023-06-06 (×5): 6 via TOPICAL

## 2023-06-02 MED ORDER — TRAMADOL HCL 50 MG PO TABS
50.0000 mg | ORAL_TABLET | Freq: Three times a day (TID) | ORAL | Status: DC | PRN
  Administered 2023-06-02 – 2023-06-06 (×4): 50 mg via ORAL
  Filled 2023-06-02 (×4): qty 1

## 2023-06-02 MED ORDER — NOREPINEPHRINE 4 MG/250ML-% IV SOLN: 2.0000 ug/min | INTRAVENOUS | Status: DC

## 2023-06-02 MED ORDER — IPRATROPIUM-ALBUTEROL 0.5-2.5 (3) MG/3ML IN SOLN
3.0000 mL | Freq: Four times a day (QID) | RESPIRATORY_TRACT | Status: DC
  Administered 2023-06-02 – 2023-06-06 (×19): 3 mL via RESPIRATORY_TRACT
  Filled 2023-06-02 (×19): qty 3

## 2023-06-02 MED ORDER — STERILE WATER FOR INJECTION IJ SOLN
INTRAMUSCULAR | Status: AC
  Administered 2023-06-02: 10 mL
  Filled 2023-06-02: qty 10

## 2023-06-02 MED ORDER — TRAMADOL HCL 50 MG PO TABS: 50.0000 mg | ORAL_TABLET | Freq: Four times a day (QID) | ORAL | Status: DC | PRN

## 2023-06-02 MED ORDER — SODIUM CHLORIDE 0.9 % IV BOLUS
500.0000 mL | Freq: Once | INTRAVENOUS | Status: AC
  Administered 2023-06-02: 500 mL via INTRAVENOUS

## 2023-06-02 MED ORDER — NOREPINEPHRINE 4 MG/250ML-% IV SOLN
INTRAVENOUS | Status: AC
  Filled 2023-06-02: qty 250

## 2023-06-02 MED ORDER — VANCOMYCIN HCL 750 MG/150ML IV SOLN
750.0000 mg | Freq: Once | INTRAVENOUS | Status: DC
  Filled 2023-06-02: qty 150

## 2023-06-02 MED ORDER — SODIUM CHLORIDE 1 G PO TABS
1.0000 g | ORAL_TABLET | Freq: Two times a day (BID) | ORAL | Status: DC
  Administered 2023-06-02 – 2023-06-06 (×5): 1 g via ORAL
  Filled 2023-06-02 (×10): qty 1

## 2023-06-02 MED ORDER — ACETAMINOPHEN 10 MG/ML IV SOLN
1000.0000 mg | Freq: Once | INTRAVENOUS | Status: AC
  Administered 2023-06-02: 1000 mg via INTRAVENOUS
  Filled 2023-06-02: qty 100

## 2023-06-02 MED ORDER — SODIUM CHLORIDE 0.9 % IV SOLN
250.0000 mL | INTRAVENOUS | Status: DC
  Administered 2023-06-02: 250 mL via INTRAVENOUS

## 2023-06-02 MED ORDER — POTASSIUM CHLORIDE 10 MEQ/100ML IV SOLN
10.0000 meq | INTRAVENOUS | Status: AC
Start: 2023-06-02 — End: 2023-06-02
  Administered 2023-06-02 (×2): 10 meq via INTRAVENOUS
  Filled 2023-06-02 (×2): qty 100

## 2023-06-02 MED ORDER — PANTOPRAZOLE SODIUM 40 MG IV SOLR
40.0000 mg | Freq: Every day | INTRAVENOUS | Status: DC
  Administered 2023-06-02 – 2023-06-05 (×4): 40 mg via INTRAVENOUS
  Filled 2023-06-02 (×4): qty 10

## 2023-06-02 MED ORDER — SODIUM CHLORIDE 0.9% FLUSH
10.0000 mL | Freq: Two times a day (BID) | INTRAVENOUS | Status: DC
  Administered 2023-06-02 (×2): 10 mL
  Administered 2023-06-03: 30 mL
  Administered 2023-06-03 – 2023-06-05 (×4): 10 mL

## 2023-06-02 MED ORDER — INSULIN ASPART 100 UNIT/ML IJ SOLN
0.0000 [IU] | INTRAMUSCULAR | Status: DC
  Administered 2023-06-02 (×2): 3 [IU] via SUBCUTANEOUS
  Administered 2023-06-02: 5 [IU] via SUBCUTANEOUS
  Administered 2023-06-02: 3 [IU] via SUBCUTANEOUS
  Administered 2023-06-02: 2 [IU] via SUBCUTANEOUS
  Administered 2023-06-03: 3 [IU] via SUBCUTANEOUS
  Administered 2023-06-03 (×2): 5 [IU] via SUBCUTANEOUS
  Administered 2023-06-03: 3 [IU] via SUBCUTANEOUS
  Administered 2023-06-04: 2 [IU] via SUBCUTANEOUS
  Administered 2023-06-04 (×3): 3 [IU] via SUBCUTANEOUS
  Administered 2023-06-04: 2 [IU] via SUBCUTANEOUS
  Administered 2023-06-05: 3 [IU] via SUBCUTANEOUS
  Administered 2023-06-05: 2 [IU] via SUBCUTANEOUS
  Administered 2023-06-05: 3 [IU] via SUBCUTANEOUS
  Administered 2023-06-05: 2 [IU] via SUBCUTANEOUS
  Administered 2023-06-06: 3 [IU] via SUBCUTANEOUS
  Administered 2023-06-06: 2 [IU] via SUBCUTANEOUS
  Filled 2023-06-02 (×21): qty 1

## 2023-06-02 MED ORDER — ACETAMINOPHEN 500 MG PO TABS
1000.0000 mg | ORAL_TABLET | Freq: Four times a day (QID) | ORAL | Status: DC | PRN
  Administered 2023-06-03 (×2): 1000 mg via ORAL
  Filled 2023-06-02 (×2): qty 2

## 2023-06-02 MED ORDER — TAMSULOSIN HCL 0.4 MG PO CAPS: 0.4000 mg | ORAL_CAPSULE | Freq: Every day | ORAL | Status: DC

## 2023-06-02 MED ORDER — AMIODARONE HCL 200 MG PO TABS
200.0000 mg | ORAL_TABLET | Freq: Every day | ORAL | Status: DC
  Administered 2023-06-02 – 2023-06-06 (×3): 200 mg via ORAL
  Filled 2023-06-02 (×3): qty 1

## 2023-06-02 MED ORDER — SODIUM CHLORIDE 0.9 % IV SOLN: 250.0000 mL | INTRAVENOUS | Status: DC

## 2023-06-02 MED ORDER — POLYETHYLENE GLYCOL 3350 17 G PO PACK: 17.0000 g | PACK | Freq: Every day | ORAL | Status: DC | PRN

## 2023-06-02 SURGICAL SUPPLY — 45 items
5 PAIRS OF YELLOW SUTURE CLAMP (MISCELLANEOUS) ×1
BAG DECANTER FOR FLEXI CONT (MISCELLANEOUS) ×2 IMPLANT
BLADE SURG SZ11 CARB STEEL (BLADE) ×2 IMPLANT
BRUSH SCRUB EZ 4% CHG (MISCELLANEOUS) ×2 IMPLANT
CHLORAPREP W/TINT 26 (MISCELLANEOUS) ×2 IMPLANT
CLAMP SUTURE YELLOW 5 PAIRS (MISCELLANEOUS) ×2 IMPLANT
CLIP SPRNG 6 S-JAW DBL (CLIP) ×1 IMPLANT
CLIP SPRNG 6MM S-JAW DBL (CLIP) ×1
DERMABOND ADVANCED .7 DNX12 (GAUZE/BANDAGES/DRESSINGS) ×2 IMPLANT
DRESSING SURGICEL FIBRLLR 1X2 (HEMOSTASIS) ×2 IMPLANT
DRSG SURGICEL FIBRILLAR 1X2 (HEMOSTASIS) ×1
ELECT CAUTERY BLADE 6.4 (BLADE) ×2 IMPLANT
ELECT REM PT RETURN 9FT ADLT (ELECTROSURGICAL) ×1
ELECTRODE REM PT RTRN 9FT ADLT (ELECTROSURGICAL) ×2 IMPLANT
GEL ULTRASOUND 20GR AQUASONIC (MISCELLANEOUS) IMPLANT
GLOVE BIO SURGEON STRL SZ7 (GLOVE) ×4 IMPLANT
GOWN STRL REUS W/ TWL LRG LVL3 (GOWN DISPOSABLE) ×4 IMPLANT
GOWN STRL REUS W/TWL 2XL LVL3 (GOWN DISPOSABLE) ×2 IMPLANT
IV NS 500ML BAXH (IV SOLUTION) ×2 IMPLANT
KIT TURNOVER KIT A (KITS) ×2 IMPLANT
LABEL OR SOLS (LABEL) ×2 IMPLANT
LOOP VESSEL MAXI 1X406 RED (MISCELLANEOUS) ×2 IMPLANT
LOOP VESSEL MINI 0.8X406 BLUE (MISCELLANEOUS) ×2 IMPLANT
MANIFOLD NEPTUNE II (INSTRUMENTS) ×2 IMPLANT
NDL FILTER BLUNT 18X1 1/2 (NEEDLE) ×1 IMPLANT
NEEDLE FILTER BLUNT 18X1 1/2 (NEEDLE) ×1 IMPLANT
NS IRRIG 500ML POUR BTL (IV SOLUTION) ×2 IMPLANT
PACK EXTREMITY ARMC (MISCELLANEOUS) ×2 IMPLANT
PAD PREP OB/GYN DISP 24X41 (PERSONAL CARE ITEMS) ×2 IMPLANT
SOLUTION CELL SAVER (CLIP) ×1 IMPLANT
SPIKE FLUID TRANSFER (MISCELLANEOUS) IMPLANT
STOCKINETTE 48X4 2 PLY STRL (GAUZE/BANDAGES/DRESSINGS) ×1 IMPLANT
STOCKINETTE STRL 4IN 9604848 (GAUZE/BANDAGES/DRESSINGS) ×1 IMPLANT
SUT MNCRL AB 4-0 PS2 18 (SUTURE) ×2 IMPLANT
SUT PROLENE 6 0 BV (SUTURE) ×2 IMPLANT
SUT SILK 2-0 18XBRD TIE 12 (SUTURE) ×2 IMPLANT
SUT SILK 3-0 18XBRD TIE 12 (SUTURE) ×2 IMPLANT
SUT SILK 4-0 18XBRD TIE 12 (SUTURE) ×2 IMPLANT
SUT VIC AB 3-0 SH 27X BRD (SUTURE) ×2 IMPLANT
SYR 20ML LL LF (SYRINGE) ×2 IMPLANT
SYR 3ML LL SCALE MARK (SYRINGE) ×2 IMPLANT
SYR TB 1ML LL NO SAFETY (SYRINGE) IMPLANT
TAG SUTURE CLAMP YLW 5PR (MISCELLANEOUS) ×1
TRAP FLUID SMOKE EVACUATOR (MISCELLANEOUS) ×2 IMPLANT
WATER STERILE IRR 500ML POUR (IV SOLUTION) ×2 IMPLANT

## 2023-06-02 NOTE — Consult Note (Signed)
[facility] Consult Note:    Date of Admission:  06/01/2023           Reason for Consult:     Referring Provider: Janann Colonel, MD Primary Care Provider: Joycelyn Rua, MD   History of Presenting Illness:  Robert Lucas is a 82 y.o. male  hx of sCHF with EF< 20%, A fib on Eliquis, HTN, HLD, DM, CAD, gout, BPH, anemia, Bell's palsy, obesity, OSA not using CPAP, former smoker, chronic hyponatremia, admitted due to sepsis and HCAP,  New 4L oxygen requirement. Sodium 123. Patient has severe AMS. CT head negative. Pt had dialysis on Friday. Shortly after HD patient became confused and started vomiting.   Found to have Hyponatremia is sodium 123, potassium 3.4, , bicarbonate 23, creatinine 3.60, BUN 51.  Nephrology consult to evaluate patient for dialysis for volume overload.  Patient is able to follow simple commands but not able to provide meaningful medical information.  He does not remember which dialysis unit he goes to or when he went there last time. He does report pain in his left upper arm near AV graft.  There is extensive bruising and skin breakdown at places.  He has significant edema over his arms, legs, thighs and lower abdomen.  Currently requiring oxygen supplementation by nasal cannula   Review of Systems: ROS-unreliable.  See above.  Past Medical History:  Diagnosis Date   Arthritis    CHF (congestive heart failure) (HCC)    HFrEF   CKD (chronic kidney disease)    sees [place] in [place]   Coronary artery disease    Diabetes (HCC)    type 2   sees endo at [place] in [place]   ESRD (end stage renal disease) on dialysis [facility])    M,W,F   Hypercholesteremia    Hypertension    Myocardial infarction (HCC) 1990   Neuromuscular disorder (HCC)    Pneumonia    PONV (postoperative nausea and vomiting)     Social History   Tobacco Use   Smoking status: Former    Types: Cigars    Quit date: 07/03/1988    Years since quitting: 34.9     Passive exposure: Never   Smokeless tobacco: Never  Vaping Use   Vaping status: Never Used  Substance Use Topics   Alcohol use: Not Currently    Comment: occasional beer   Drug use: No    Family History  Problem Relation Age of Onset   Dementia Mother    Stroke Brother      OBJECTIVE: Blood pressure (!) 118/48, pulse 74, temperature 98.3 F (36.8 C), resp. rate 15, height 5\' 2"  (1.575 m), weight 75.3 kg, SpO2 99%.  Physical Exam General Appearance-chronically ill-appearing, no acute distress HEENT-moist oral mucous membranes Pulmonary: Crary O2 decreased breath sounds at bases Cardiac: Irregular rhythm, tachycardic Abdomen: Soft, mildly distended, nontender Extremities: Anasarca Dialysis Access: Left upper extremity AV graft Neuro: Able to follow simple commands but appears confused.  Lab Results Lab Results  Component Value Date   WBC 7.2 06/02/2023   HGB 9.7 (L) 06/02/2023   HCT 27.9 (L) 06/02/2023   MCV 98.2 06/02/2023   PLT 100 (L) 06/02/2023    Lab Results  Component Value Date   CREATININE 3.81 (H) 06/02/2023   BUN 59 (H) 06/02/2023   NA 121 (L) 06/02/2023   K 3.5 06/02/2023   CL 86 (L) 06/02/2023   CO2 21 (L) 06/02/2023    Lab Results  Component Value  Date   ALT 24 06/02/2023   AST 29 06/02/2023   ALKPHOS 187 (H) 06/02/2023   BILITOT 1.6 (H) 06/02/2023     Microbiology: Recent Results (from the past 240 hour(s))  Culture, blood (x 2)     Status: None (Preliminary result)   Collection Time: 06/01/23  6:22 PM   Specimen: BLOOD  Result Value Ref Range Status   Specimen Description BLOOD  Final   Special Requests NONE  Final   Culture   Final    NO GROWTH < 12 HOURS Performed at [facility], [address] [place], [place] [address]    Report Status PENDING  Incomplete  Culture, blood (x 2)     Status: None (Preliminary result)   Collection Time: 06/01/23  6:22 PM   Specimen: BLOOD  Result Value Ref Range Status   Specimen  Description BLOOD  Final   Special Requests NONE  Final   Culture   Final    NO GROWTH < 12 HOURS Performed at [facility], [address] [place], [place] [address]    Report Status PENDING  Incomplete  Resp panel by RT-PCR (RSV, Flu A&B, Covid) Anterior Nasal Swab     Status: None   Collection Time: 06/01/23  7:14 PM   Specimen: Anterior Nasal Swab  Result Value Ref Range Status   SARS Coronavirus 2 by RT PCR NEGATIVE NEGATIVE Final    Comment: (NOTE) SARS-CoV-2 target nucleic acids are NOT DETECTED.  The SARS-CoV-2 RNA is generally detectable in upper respiratory specimens during the acute phase of infection. The lowest concentration of SARS-CoV-2 viral copies this assay can detect is 138 copies/mL. A negative result does not preclude SARS-Cov-2 infection and should not be used as the sole basis for treatment or other patient management decisions. A negative result may occur with  improper specimen collection/handling, submission of specimen other than nasopharyngeal swab, presence of viral mutation(s) within the areas targeted by this assay, and inadequate number of viral copies(<138 copies/mL). A negative result must be combined with clinical observations, patient history, and epidemiological information. The expected result is Negative.  Fact Sheet for Patients:  BloggerCourse.com  Fact Sheet for Healthcare Providers:  SeriousBroker.it  This test is no t yet approved or cleared by the [place] FDA and  has been authorized for detection and/or diagnosis of SARS-CoV-2 by FDA under an Emergency Use Authorization (EUA). This EUA will remain  in effect (meaning this test can be used) for the duration of the COVID-19 declaration under Section 564(b)(1) of the Act, 21 U.S.C.section 360bbb-3(b)(1), unless the authorization is terminated  or revoked sooner.       Influenza A by PCR NEGATIVE NEGATIVE Final    Influenza B by PCR NEGATIVE NEGATIVE Final    Comment: (NOTE) The Xpert Xpress SARS-CoV-2/FLU/RSV plus assay is intended as an aid in the diagnosis of influenza from Nasopharyngeal swab specimens and should not be used as a sole basis for treatment. Nasal washings and aspirates are unacceptable for Xpert Xpress SARS-CoV-2/FLU/RSV testing.  Fact Sheet for Patients: BloggerCourse.com  Fact Sheet for Healthcare Providers: SeriousBroker.it  This test is not yet approved or cleared by the [place] FDA and has been authorized for detection and/or diagnosis of SARS-CoV-2 by FDA under an Emergency Use Authorization (EUA). This EUA will remain in effect (meaning this test can be used) for the duration of the COVID-19 declaration under Section 564(b)(1) of the Act, 21 U.S.C. section 360bbb-3(b)(1), unless the authorization is terminated  or revoked.     Resp Syncytial Virus by PCR NEGATIVE NEGATIVE Final    Comment: (NOTE) Fact Sheet for Patients: BloggerCourse.com  Fact Sheet for Healthcare Providers: SeriousBroker.it  This test is not yet approved or cleared by the [place] FDA and has been authorized for detection and/or diagnosis of SARS-CoV-2 by FDA under an Emergency Use Authorization (EUA). This EUA will remain in effect (meaning this test can be used) for the duration of the COVID-19 declaration under Section 564(b)(1) of the Act, 21 U.S.C. section 360bbb-3(b)(1), unless the authorization is terminated or revoked.  Performed at [facility], [address] [place], [place] [address]   MRSA Next Gen by PCR, Nasal     Status: None   Collection Time: 06/02/23  2:11 AM   Specimen: Nasal Mucosa; Nasal Swab  Result Value Ref Range Status   MRSA by PCR Next Gen NOT DETECTED NOT DETECTED Final    Comment: (NOTE) The GeneXpert MRSA Assay (FDA approved for NASAL  specimens only), is one component of a comprehensive MRSA colonization surveillance program. It is not intended to diagnose MRSA infection nor to guide or monitor treatment for MRSA infections. Test performance is not FDA approved in patients less than 79 years old. Performed at [facility], [address] [place], [place] [address]     Medications: Scheduled Meds:  allopurinol  100 mg Oral Daily   amiodarone  200 mg Oral Daily   atorvastatin  40 mg Oral QHS   Chlorhexidine Gluconate Cloth  6 each Topical Daily   hydrocortisone sod succinate (SOLU-CORTEF) inj  100 mg Intravenous BID   insulin aspart  0-15 Units Subcutaneous Q4H   ipratropium-albuterol  3 mL Nebulization Q6H   midodrine  10 mg Oral TID WC   mouth rinse  15 mL Mouth Rinse 4 times per day   pantoprazole (PROTONIX) IV  40 mg Intravenous Daily   sodium chloride flush  10-40 mL Intracatheter Q12H   sodium chloride  1 g Oral BID WC   tamsulosin  0.4 mg Oral QPC supper   Continuous Infusions:  sodium chloride 10 mL/hr at 06/02/23 0600   sodium chloride     ceFEPime (MAXIPIME) IV Stopped (06/02/23 0239)   magnesium sulfate bolus IVPB     norepinephrine (LEVOPHED) Adult infusion 4 mcg/min (06/02/23 0600)   [START ON 06/04/2023] vancomycin     PRN Meds:.acetaminophen, dextromethorphan-guaiFENesin, ondansetron (ZOFRAN) IV, mouth rinse, polyethylene glycol, sodium chloride flush  Allergies  Allergen Reactions   Zestril [Lisinopril] Cough   Hydrocodone Nausea Only   Januvia [Sitagliptin] Other (See Comments)    Unknown   Levitra [Vardenafil] Other (See Comments)    flushing   Prednisone Other (See Comments)    Elevates BGL; patient prefers to not take this   Victoza [Liraglutide] Other (See Comments)    GI upset   Penicillins Rash and Other (See Comments)    Rash around ankles Has patient had a PCN reaction causing immediate rash, facial/tongue/throat swelling, SOB or lightheadedness with hypotension:  Yes Has patient had a PCN reaction causing severe rash involving mucus membranes or skin necrosis: Unk Has patient had a PCN reaction that required hospitalization: No Has patient had a PCN reaction occurring within the last 10 years: No If all of the above answers are "NO", then may proceed with Cephalosporin use.     Urinalysis: No results for input(s): "COLORURINE", "LABSPEC", "PHURINE", "GLUCOSEU", "HGBUR", "BILIRUBINUR", "KETONESUR", "PROTEINUR", "UROBILINOGEN", "NITRITE", "LEUKOCYTESUR" in the last 72 hours.  Invalid input(s): "APPERANCEUR"    Imaging: DG Chest Port 1 View  Result Date: 06/01/2023 CLINICAL DATA:  Question of sepsis to evaluate for abnormality. Altered mental status. EXAM: PORTABLE CHEST 1 VIEW COMPARISON:  05/18/2023 FINDINGS: Shallow inspiration. Cardiac enlargement with mild perihilar infiltration, likely edema but could be pneumonia. No pleural effusions. No pneumothorax. Mediastinal contours appear intact. Calcification of the aorta. IMPRESSION: Cardiac enlargement with mild perihilar infiltration, progressing since prior study. Electronically Signed   By: Burman Nieves M.D.   On: 06/01/2023 19:06   CT Head Wo Contrast  Result Date: 06/01/2023 CLINICAL DATA:  Mental status change of unknown cause. Elevated BUN. EXAM: CT HEAD WITHOUT CONTRAST TECHNIQUE: Contiguous axial images were obtained from the base of the skull through the vertex without intravenous contrast. RADIATION DOSE REDUCTION: This exam was performed according to the departmental dose-optimization program which includes automated exposure control, adjustment of the mA and/or kV according to patient size and/or use of iterative reconstruction technique. COMPARISON:  CT head 06/30/2022.  MRI brain 06/30/2022. FINDINGS: Brain: Mild diffuse cerebral atrophy. Low-attenuation changes in the deep white matter consistent with small vessel ischemia. No mass-effect or midline shift. No abnormal extra-axial  fluid collections. Gray-white matter junctions are distinct. Basal cisterns are not effaced. No acute intracranial hemorrhage. Vascular: No hyperdense vessel or unexpected calcification. Skull: Normal. Negative for fracture or focal lesion. Sinuses/Orbits: No acute finding. Other: None. IMPRESSION: No acute intracranial abnormalities. Mild chronic atrophy and small vessel ischemic changes. Electronically Signed   By: Burman Nieves M.D.   On: 06/01/2023 19:05      Assessment/Plan:  HAROUT AROYO is a 82 y.o. male with medical problems of  sCHF with EF< 20%, A fib on Eliquis, HTN, HLD, DM, CAD with history of CABG, gout, BPH, anemia, Bell's palsy, obesity, OSA not using CPAP, former smoker, chronic hyponatremia, was admitted on 06/01/2023 for :  Acute encephalopathy [G93.40] HCAP (healthcare-associated pneumonia) [J18.9] Pneumonia of right lung due to infectious organism, unspecified part of lung [J18.9]  Outpatient dialysis--[facility], Alanson Puls, MD Mircera 50 mcg, Venofer Target weight 68 kg  # ESRD with volume overload. Patient reports that he likes to drink lots of fluids. He has had problems with volume overload as outpatient also. Electrolytes are acceptable. We will attempt UF only dialysis treatment. Fluid goal removal 2 to 2.5 L as tolerated.  # Hyponatremia Due to fluid overload.  Discussed fluid restriction with patient. Expected to improve some with correction of volume status.  #Anemia in CKD Receives Mircera and IV Venofer outpatient. Hemoglobin 9.8.  Acceptable.  # Thrombocytopenia Known at least since April 2023.  Platelets range between 59-140.  # sepsis with LUE cellulitis Patient with hx of Left arm brachiocephalic fistula ligation, Brachial artery to basilic vein AV graft creation by Dr. Karin Lieu on 05/02/22 left upper extremity Doppler did not show any stenosis. It does show extensive arm edema without DVT. Due to concern of  cellulitis, recommend temporary dialysis catheter until concern for infection is resolved.    Jamieson Lisa 06/02/23

## 2023-06-02 NOTE — IPAL (Signed)
  Interdisciplinary Goals of Care Family Meeting   Date carried out: 06/02/2023  Location of the meeting: Bedside  Member's involved: Physician and Family Member or next of kin  Durable Power of Attorney or acting medical decision maker: Patient's Wife Robert Lucas and daughter Robert Lucas.     Discussion: We discussed goals of care for Robert Lucas . His daughter Robert Lucas relayed that her father appreciates his quality of life and would not want to live on machines. And given his severe heart failure, ESRD, weakness enduring CPR and going back to a good quality of life is less probable. There we agreed t  Code status: DNR/DNI  Disposition: Continue current acute care  Time spent for the meeting: 15    Janann Colonel, MD  06/02/2023, 8:38 AM

## 2023-06-02 NOTE — Progress Notes (Signed)
eLink Physician-Brief Progress Note Patient Name: Robert Lucas DOB: 06-23-1941 MRN: [id number]   Date of Service  06/02/2023  HPI/Events of Note  Patient admitted earlier by [facility]  Hospital[facility] for fever, altered mental status from suspected pneumonia and sepsis, patient also with hypoxemic respiratory failure, he was receiving  work-up and treatment on the medical floor and developed hypotension requiring ICU transfer.  eICU Interventions  New Patient Evaluation.        Thomasene Lot Rashed Edler 06/02/2023, 12:28 AM

## 2023-06-02 NOTE — Consult Note (Signed)
PHARMACY - PHYSICIAN COMMUNICATION CRITICAL VALUE ALERT - BLOOD CULTURE IDENTIFICATION (BCID)  Robert Lucas is an 82 y.o. male who presented to [facility] on 06/01/2023 with a chief complaint of fever and altered mental status. Admitted for sepsis.   Assessment:  2/4 GPC, BCID staph spp, no resistance.   Name of physician (or Provider) Contacted: Dr. Clifton Custard.   Current antibiotics:  Vancomycin 750 mg IV M-W-F with HD Cefepime 1g IV Q24H  Changes to prescribed antibiotics recommended:  Patient is on recommended antibiotics - No changes needed Will adjust as cultures develop  No results found for this or any previous visit.  [name], PharmD Pharmacy Resident  06/02/2023 1:22 PM

## 2023-06-02 NOTE — Progress Notes (Addendum)
Rapid Response Event Note   Reason for Call :  Called to room 259 for unresponsive patient with low b/p   Initial Focused Assessment:  On arrival to patient bedside, there was minimal response to sternal rub, no opening of eyes no verbal response, PERL.  Systolic BP 55 manually.     Interventions:  Dr. Clyde Lundborg at bedside, order received for transfer to ICU for levophed drip, ABG obtained and patient placed on BIPAP   Plan of Care:  Patient transferred to ICU bed 10, Levophed drip started, central line/Art line placed by NP Dahlia Byes. VS currently WDL set by provider.    MD Notified: 2350 Call Time: 2337 Arrival [id number] End Time: 0000 Skip Estimable, RN

## 2023-06-02 NOTE — Plan of Care (Signed)
  Problem: Fluid Volume: Goal: Hemodynamic stability will improve Outcome: Progressing   Problem: Respiratory: Goal: Ability to maintain adequate ventilation will improve Outcome: Progressing   Problem: Fluid Volume: Goal: Ability to maintain a balanced intake and output will improve Outcome: Progressing   Problem: Metabolic: Goal: Ability to maintain appropriate glucose levels will improve Outcome: Progressing

## 2023-06-02 NOTE — Procedures (Signed)
Central Venous Catheter Insertion Procedure Note  Robert Lucas  [id number]  05/30/1941  Date:06/02/23  Time:2:23 AM   Provider Performing:Kiaan Overholser Perlie Gold   Procedure: Insertion of Non-tunneled Central Venous Catheter(36556) with US guidance (66440)   Indication(s) Medication administration and Difficult access  Consent Unable to obtain consent due to emergent nature of procedure.  Anesthesia Topical only with 1% lidocaine   Timeout Verified patient identification, verified procedure, site/side was marked, verified correct patient position, special equipment/implants available, medications/allergies/relevant history reviewed, required imaging and test results available.  Sterile Technique Maximal sterile technique including full sterile barrier drape, hand hygiene, sterile gown, sterile gloves, mask, hair covering, sterile ultrasound probe cover (if used).  Procedure Description Area of catheter insertion was cleaned with chlorhexidine and draped in sterile fashion.  With real-time ultrasound guidance a central venous catheter was placed into the right femoral vein. Nonpulsatile blood flow and easy flushing noted in all ports.  The catheter was sutured in place and sterile dressing applied.  Complications/Tolerance None; patient tolerated the procedure well. Chest x-ray is not ordered for femoral cannulation.  EBL Minimal  Specimen(s) None

## 2023-06-02 NOTE — Consult Note (Signed)
PHARMACY - ANTICOAGULATION CONSULT NOTE  Pharmacy Consult for heparin  Indication: atrial fibrillation  Allergies  Allergen Reactions   Zestril [Lisinopril] Cough   Hydrocodone Nausea Only   Januvia [Sitagliptin] Other (See Comments)    Unknown   Levitra [Vardenafil] Other (See Comments)    flushing   Prednisone Other (See Comments)    Elevates BGL; patient prefers to not take this   Victoza [Liraglutide] Other (See Comments)    GI upset   Penicillins Rash and Other (See Comments)    Rash around ankles Has patient had a PCN reaction causing immediate rash, facial/tongue/throat swelling, SOB or lightheadedness with hypotension: Yes Has patient had a PCN reaction causing severe rash involving mucus membranes or skin necrosis: Unk Has patient had a PCN reaction that required hospitalization: No Has patient had a PCN reaction occurring within the last 10 years: No If all of the above answers are "NO", then may proceed with Cephalosporin use.     Patient Measurements: Height: 5\' 2"  (157.5 cm) Weight: 75.3 kg (166 lb 0.1 oz) IBW/kg (Calculated) : 54.6 Heparin Dosing Weight: 70.4 kg  Vital Signs: Temp: 97.7 F (36.5 C) (11/30 0850) Temp Source: Axillary (11/30 0850) BP: 118/48 (11/30 0100) Pulse Rate: 78 (11/30 0900)  Labs: Recent Labs    06/01/23 1819 06/01/23 2357 06/02/23 0213  HGB 10.4*  --  9.7*  HCT 30.2*  --  27.9*  PLT 97*  --  100*  APTT 49*  --   --   LABPROT 16.5*  --   --   INR 1.3*  --   --   CREATININE 3.60* 3.86* 3.81*    Estimated Creatinine Clearance: 12.8 mL/min (A) (by C-G formula based on SCr of 3.81 mg/dL (H)).   Medical History: Past Medical History:  Diagnosis Date   Arthritis    CHF (congestive heart failure) (HCC)    HFrEF   CKD (chronic kidney disease)    sees [place] in [place]   Coronary artery disease    Diabetes (HCC)    type 2   sees endo at [place] in [place]   ESRD (end stage renal disease) on dialysis [facility])    M,W,F    Hypercholesteremia    Hypertension    Myocardial infarction (HCC) 1990   Neuromuscular disorder (HCC)    Pneumonia    PONV (postoperative nausea and vomiting)     PTA Medications:  - Apixaban 2.5 mg BID for new Afib diagnosis, last dose was 11/29 at 0759   Assessment: Patient is an 82 y.o. male who presented to the ED from SNF on 11/29 with AMS and fever. PMH includes CAD, HTN, OSA, Afib, HFrEF with EF < 20%, T2DM,  thrombocytopenia, and ESRD on HD MWF. Most recent hospitalization was 11/15-11/27, patient diagnosed with Afib and started on amiodarone and Eliquis. Patient admitted  to ICU 11/29 for severe sepsis and hypotension with SBP in the 50s. NPO until mental status improves. Hgb 9.7, Plt 100. Baseline HL > 1.10 due to Eliquis PTA, aPTT elevated at 66 s.  Goal of Therapy:  Heparin level 0.3-0.7 units/ml aPTT 90-102 s Monitor platelets by anticoagulation protocol: Yes   Plan:  - No bolus due to patient on Eliquis PTA - Initiate IV heparin at 1000 units/hr  - 8 hour aPTT after starting heparin drip, daily heparin level and CBC while on heparin  - Titrate heparin drip using aPTT until level correlates with HL, then transition to HL dosing   Karlton Lemon,  PharmD Candidate 06/02/2023,10:39 AM

## 2023-06-02 NOTE — Procedures (Signed)
Arterial Catheter Insertion Procedure Note  LONNE VANDEMARK  [id number]  1940/10/23  Date:06/02/23  Time:2:25 AM    Provider Performing: Dahlia Byes    Procedure: Insertion of Arterial Line (40981) with US guidance (19147)   Indication(s) Blood pressure monitoring and/or need for frequent ABGs  Consent Unable to obtain consent due to emergent nature of procedure.  Anesthesia 1% lidocaine  Time Out Verified patient identification, verified procedure, site/side was marked, verified correct patient position, special equipment/implants available, medications/allergies/relevant history reviewed, required imaging and test results available.  Sterile Technique Maximal sterile technique including full sterile barrier drape, hand hygiene, sterile gown, sterile gloves, mask, hair covering, sterile ultrasound probe cover (if used).   Procedure Description Area of catheter insertion was cleaned with chlorhexidine and draped in sterile fashion. With real-time ultrasound guidance an arterial catheter was placed into the right femoral artery.  Appropriate arterial tracings confirmed on monitor.    Complications/Tolerance None; patient tolerated the procedure well.  EBL Minimal  Specimen(s) None

## 2023-06-02 NOTE — Progress Notes (Addendum)
Received patient in bed.Awake,alert and oriented x 4. Consent verified. Nose bleeding noted.  Access used: Left upper arm AVG that worked well.Dark bluish skin discoloration noted on his left upper arm.  Medicine given: Vancomycin 750 mg=150 cc.  Duration of treatment :2.5 hours.  Fluid removed : 2400 cc.  Tolerated treatment: Yes.  Hand off to the patient's nurse.

## 2023-06-02 NOTE — Plan of Care (Signed)
  Problem: Fluid Volume: Goal: Hemodynamic stability will improve Outcome: Progressing   Problem: Respiratory: Goal: Ability to maintain adequate ventilation will improve Outcome: Progressing   Problem: Fluid Volume: Goal: Ability to maintain a balanced intake and output will improve Outcome: Progressing   Problem: Tissue Perfusion: Goal: Adequacy of tissue perfusion will improve Outcome: Progressing

## 2023-06-02 NOTE — Consult Note (Signed)
PHARMACY - ANTICOAGULATION CONSULT NOTE  Pharmacy Consult for heparin  Indication: atrial fibrillation  Allergies  Allergen Reactions   Zestril [Lisinopril] Cough   Hydrocodone Nausea Only   Januvia [Sitagliptin] Other (See Comments)    Unknown   Levitra [Vardenafil] Other (See Comments)    flushing   Prednisone Other (See Comments)    Elevates BGL; patient prefers to not take this   Victoza [Liraglutide] Other (See Comments)    GI upset   Penicillins Rash and Other (See Comments)    Rash around ankles Has patient had a PCN reaction causing immediate rash, facial/tongue/throat swelling, SOB or lightheadedness with hypotension: Yes Has patient had a PCN reaction causing severe rash involving mucus membranes or skin necrosis: Unk Has patient had a PCN reaction that required hospitalization: No Has patient had a PCN reaction occurring within the last 10 years: No If all of the above answers are "NO", then may proceed with Cephalosporin use.     Patient Measurements: Height: 5\' 2"  (157.5 cm) Weight: 75.3 kg (166 lb 0.1 oz) IBW/kg (Calculated) : 54.6 Heparin Dosing Weight: 70.4 kg  Vital Signs: Temp: 97.7 F (36.5 C) (11/30 1800) Temp Source: Oral (11/30 1800) BP: 153/58 (11/30 2045) Pulse Rate: 85 (11/30 1800)  Labs: Recent Labs    06/01/23 1819 06/01/23 2357 06/02/23 0213 06/02/23 1044 06/02/23 1400  HGB 10.4*  --  9.7*  --   --   HCT 30.2*  --  27.9*  --   --   PLT 97*  --  100*  --   --   APTT 49*  --   --  66*  --   LABPROT 16.5*  --   --   --   --   INR 1.3*  --   --   --   --   HEPARINUNFRC  --   --   --  >1.10*  --   CREATININE 3.60* 3.86* 3.81*  --  4.31*    Estimated Creatinine Clearance: 11.4 mL/min (A) (by C-G formula based on SCr of 4.31 mg/dL (H)).   Medical History: Past Medical History:  Diagnosis Date   Arthritis    CHF (congestive heart failure) (HCC)    HFrEF   CKD (chronic kidney disease)    sees [place] in [place]   Coronary artery  disease    Diabetes (HCC)    type 2   sees endo at [place] in [place]   ESRD (end stage renal disease) on dialysis [facility])    M,W,F   Hypercholesteremia    Hypertension    Myocardial infarction (HCC) 1990   Neuromuscular disorder (HCC)    Pneumonia    PONV (postoperative nausea and vomiting)     PTA Medications:  - Apixaban 2.5 mg BID for new Afib diagnosis, last dose was 11/29 at 0759   Assessment: Patient is an 82 y.o. male who presented to the ED from SNF on 11/29 with AMS and fever. PMH includes CAD, HTN, OSA, Afib, HFrEF with EF < 20%, T2DM,  thrombocytopenia, and ESRD on HD MWF. Most recent hospitalization was 11/15-11/27, patient diagnosed with Afib and started on amiodarone and Eliquis. Patient admitted  to ICU 11/29 for severe sepsis and hypotension with SBP in the 50s. NPO until mental status improves. Hgb 9.7, Plt 100. Baseline HL > 1.10 due to Eliquis PTA, aPTT elevated at 66 s.  Date/Time aPTT/HL Rate  Comment 11/30 2150 62  1000 units/hr  subtherapeutic  Goal of Therapy:  Heparin  level 0.3-0.7 units/ml aPTT 90-102 s Monitor platelets by anticoagulation protocol: Yes   Plan:  -  aPTT level is subtherapeutic - Give 1000 units bolus - Increase IV heparin infusion to a rate of 1150 units/hr  - 8 hour aPTT after starting heparin drip, daily heparin level and CBC while on heparin  - Titrate heparin drip using aPTT until level correlates with HL, then transition to HL dosing   Merryl Hacker, PharmD Clinical Pharmacist 06/02/2023,9:01 PM

## 2023-06-02 NOTE — Consult Note (Signed)
[facility] [place]  Patient name: Robert Lucas MRN: [id number] DOB: 1940/12/24 Sex: male   REQUESTING PROVIDER:   ICU   REASON FOR CONSULT:    Sepsis  HISTORY OF PRESENT ILLNESS:   SIGFRED Lucas is a 82 y.o. male, who was admitted from a skilled nursing facility on 06/01/2023 with fever and confusion.  He was recently hospitalized 2 weeks ago with new onset atrial fibrillation and started on Eliquis.  Reportedly the patient was alert and oriented and at his baseline earlier.  On arrival to the emergency department the patient had altered mental status.  He was not arousable or following commands.  He did move extremities to painful stimuli.  He was diagnosed with sepsis and hypoxia secondary to hospital-acquired pneumonia.  He was admitted to the ICU and started on Levophed as well as broad-spectrum antibiotics.  The patient is on dialysis via a left upper arm graft.  This was placed in October 2023 by Dr. Karin Lieu.  The patient does complain of left arm swelling and tenderness.  He has a history of coronary artery disease as well as heart failure with ejection fraction less than 20%.  He is on dialysis Monday Wednesday Friday.  He takes a statin for hypercholesterolemia.  He is a diabetic.  He is medically managed for hypertension.  He is a former smoker  PAST MEDICAL HISTORY    Past Medical History:  Diagnosis Date   Arthritis    CHF (congestive heart failure) (HCC)    HFrEF   CKD (chronic kidney disease)    sees [place] in [place]   Coronary artery disease    Diabetes (HCC)    type 2   sees endo at [place] in [place]   ESRD (end stage renal disease) on dialysis (HCC)    M,W,F   Hypercholesteremia    Hypertension    Myocardial infarction (HCC) 1990   Neuromuscular disorder (HCC)    Pneumonia    PONV (postoperative nausea and vomiting)      FAMILY HISTORY   Family History  Problem Relation Age of Onset    Dementia Mother    Stroke Brother     SOCIAL HISTORY:   Social History   Socioeconomic History   Marital status: Married    Spouse name: Not on file   Number of children: 3   Years of education: Not on file   Highest education level: Not on file  Occupational History   Not on file  Tobacco Use   Smoking status: Former    Types: Cigars    Quit date: 07/03/1988    Years since quitting: 34.9    Passive exposure: Never   Smokeless tobacco: Never  Vaping Use   Vaping status: Never Used  Substance and Sexual Activity   Alcohol use: Not Currently    Comment: occasional beer   Drug use: No   Sexual activity: Not Currently  Other Topics Concern   Not on file  Social History Narrative   Not on file   Social Determinants of Health   Financial Resource Strain: Not on file  Food Insecurity: No Food Insecurity (05/19/2023)   Hunger Vital Sign    Worried About Running Out of Food in the Last Year: Never true    Ran Out of Food in the Last Year: Never true  Transportation Needs: No Transportation Needs (05/19/2023)   PRAPARE - Transportation    Lack of Transportation (Medical): No    Lack  of Transportation (Non-Medical): No  Physical Activity: Not on file  Stress: Not on file  Social Connections: Not on file  Intimate Partner Violence: Not At Risk (05/19/2023)   Humiliation, Afraid, Rape, and Kick questionnaire    Fear of Current or Ex-Partner: No    Emotionally Abused: No    Physically Abused: No    Sexually Abused: No    ALLERGIES:    Allergies  Allergen Reactions   Zestril [Lisinopril] Cough   Hydrocodone Nausea Only   Januvia [Sitagliptin] Other (See Comments)    Unknown   Levitra [Vardenafil] Other (See Comments)    flushing   Prednisone Other (See Comments)    Elevates BGL; patient prefers to not take this   Victoza [Liraglutide] Other (See Comments)    GI upset   Penicillins Rash and Other (See Comments)    Rash around ankles Has patient had a PCN  reaction causing immediate rash, facial/tongue/throat swelling, SOB or lightheadedness with hypotension: Yes Has patient had a PCN reaction causing severe rash involving mucus membranes or skin necrosis: Unk Has patient had a PCN reaction that required hospitalization: No Has patient had a PCN reaction occurring within the last 10 years: No If all of the above answers are "NO", then may proceed with Cephalosporin use.     CURRENT MEDICATIONS:    Current Facility-Administered Medications  Medication Dose Route Frequency Provider Last Rate Last Admin   0.9 %  sodium chloride infusion  250 mL Intravenous Continuous Lorretta Harp, MD 10 mL/hr at 06/02/23 1724 Infusion Verify at 06/02/23 1724   0.9 %  sodium chloride infusion  250 mL Intravenous Continuous Dahlia Byes, NP       acetaminophen (TYLENOL) suppository 650 mg  650 mg Rectal Q6H PRN Lorretta Harp, MD       allopurinol (ZYLOPRIM) tablet 100 mg  100 mg Oral Daily Dahlia Byes, NP   100 mg at 06/02/23 1035   amiodarone (PACERONE) tablet 200 mg  200 mg Oral Daily Dahlia Byes, NP   200 mg at 06/02/23 1035   atorvastatin (LIPITOR) tablet 40 mg  40 mg Oral QHS Dahlia Byes, NP       ceFEPIme (MAXIPIME) 1 g in sodium chloride 0.9 % 100 mL IVPB  1 g Intravenous Q24H Lorretta Harp, MD   Stopped at 06/02/23 0239   Chlorhexidine Gluconate Cloth 2 % PADS 6 each  6 each Topical Daily Assaker, [place], MD   6 each at 06/02/23 1210   [START ON 06/03/2023] Chlorhexidine Gluconate Cloth 2 % PADS 6 each  6 each Topical Q0600 Breeze, Gery Pray, NP       dextromethorphan-guaiFENesin (MUCINEX DM) 30-600 MG per 12 hr tablet 1 tablet  1 tablet Oral BID PRN Lorretta Harp, MD       heparin ADULT infusion 100 units/mL (25000 units/265mL)  1,000 Units/hr Intravenous Continuous [name], [facility]   Stopped at 06/02/23 1655   heparin injection 1,000 Units  1,000 Units Intracatheter PRN Wendee Beavers, NP       hydrocortisone sodium succinate  (SOLU-CORTEF) 100 MG injection 100 mg  100 mg Intravenous BID Dahlia Byes, NP   100 mg at 06/02/23 1714   insulin aspart (novoLOG) injection 0-15 Units  0-15 Units Subcutaneous Q4H Dahlia Byes, NP   3 Units at 06/02/23 1714   ipratropium-albuterol (DUONEB) 0.5-2.5 (3) MG/3ML nebulizer solution 3 mL  3 mL Nebulization Q6H Dahlia Byes, NP   3 mL at 06/02/23 1417   midodrine (PROAMATINE) tablet 10  mg  10 mg Oral TID WC Dahlia Byes, NP   10 mg at 06/02/23 1714   norepinephrine (LEVOPHED) 4mg  in (0.016 mg/mL) premix infusion  2-50 mcg/min Intravenous Titrated Assaker, [place], MD   Stopped at 06/02/23 1025   ondansetron (ZOFRAN) injection 4 mg  4 mg Intravenous Q8H PRN Lorretta Harp, MD       Oral care mouth rinse  15 mL Mouth Rinse 4 times per day Janann Colonel, MD   15 mL at 06/02/23 1211   Oral care mouth rinse  15 mL Mouth Rinse PRN Assaker, [place], MD       pantoprazole (PROTONIX) injection 40 mg  40 mg Intravenous Daily Dahlia Byes, NP   40 mg at 06/02/23 1036   polyethylene glycol (MIRALAX / GLYCOLAX) packet 17 g  17 g Oral Daily PRN Dahlia Byes, NP       sodium chloride flush (NS) 0.9 % injection 10-40 mL  10-40 mL Intracatheter Q12H Assaker, [place], MD   10 mL at 06/02/23 1037   sodium chloride flush (NS) 0.9 % injection 10-40 mL  10-40 mL Intracatheter PRN Assaker, [place], MD       sodium chloride tablet 1 g  1 g Oral BID WC Dahlia Byes, NP   1 g at 06/02/23 1714   [START ON 06/04/2023] vancomycin (VANCOREADY) IVPB 750 mg/150 mL  750 mg Intravenous Q M,W,F-HD Lorretta Harp, MD       vancomycin (VANCOREADY) IVPB 750 mg/150 mL  750 mg Intravenous Once Tressie Ellis, RPH        REVIEW OF SYSTEMS:   [X]  denotes positive finding, [ ]  denotes negative finding Cardiac  Comments:  Chest pain or chest pressure:    Shortness of breath upon exertion: x   Short of breath when lying flat:    Irregular heart rhythm:        Vascular     Pain in calf, thigh, or hip brought on by ambulation:    Pain in feet at night that wakes you up from your sleep:     Blood clot in your veins:    Leg swelling:         Pulmonary    Oxygen at home:    Productive cough:  x   Wheezing:  x       Neurologic    Sudden weakness in arms or legs:     Sudden numbness in arms or legs:     Sudden onset of difficulty speaking or slurred speech:    Temporary loss of vision in one eye:     Problems with dizziness:         Gastrointestinal    Blood in stool:      Vomited blood:         Genitourinary    Burning when urinating:     Blood in urine:        Psychiatric    Major depression:         Hematologic    Bleeding problems:    Problems with blood clotting too easily:        Skin    Rashes or ulcers:        Constitutional    Fever or chills:     PHYSICAL EXAM:   Vitals:   06/02/23 1419 06/02/23 1500 06/02/23 1600 06/02/23 1700  BP:      Pulse: 88  84 84  Resp: 18 17 16 14   Temp:    Marland Kitchen)  97.5 F (36.4 C)  TempSrc:    Axillary  SpO2: 94%  (!) 89% 91%  Weight:      Height:        GENERAL: The patient is a well-nourished male, in no acute distress. The vital signs are documented above. CARDIAC: There is a regular rate and rhythm.  VASCULAR: Palpable thrill in left upper arm graft.  It is somewhat pulsatile.  No significant erythema around the graft.  There is no graft exposed.  The forearm is edematous and erythematous. PULMONARY: Nonlabored respirations but productive cough MUSCULOSKELETAL: There are no major deformities or cyanosis. NEUROLOGIC: No focal weakness or paresthesias are detected. SKIN: There are no ulcers or rashes noted. PSYCHIATRIC: The patient has a normal affect.  STUDIES:   I have reviewed his chest x-ray which shows perihilar infiltrate Ultrasound of his left arm dialysis graft was performed.  I see if significant amount of subcutaneous edema however there were no significant fluid collections  around the graft  ASSESSMENT and PLAN    Sepsis: I suspect the most likely underlying etiology is his pneumonia.  There is also concern that his dialysis graft could be the source.  On exam there is no significant erythema around the graft although the forearm is edematous and erythematous.  The ultrasound of the graft did not show any obvious fluid around the graft just subcutaneous edema.  I discussed with the patient and his family that I would like to do everything possible to try to salvage this graft.  Fortunately, since starting antibiotics and resuscitation, he has been able to come off of the Levophed and his mental status has significantly improved.  I would recommend continuing to treat him for his pneumonia and follow his blood cultures.  There is no acute indication to take his dialysis graft out.  Hopefully we can treat through this and salvage it.  I will continue to follow along while the patient remains ill.  Charlena Cross, MD, FACS [facility] [phone] Pager [phone]

## 2023-06-02 NOTE — Progress Notes (Signed)
Dr. Larinda Buttery ordered to hold patient's heparin drip held due to patient's nose bleeding that occurred.

## 2023-06-02 NOTE — Consult Note (Signed)
PHARMACY CONSULT NOTE - ELECTROLYTES  Pharmacy Consult for Electrolyte Monitoring and Replacement   Recent Labs: Potassium (mmol/L)  Date Value  06/02/2023 3.5   Magnesium (mg/dL)  Date Value  60/45/4098 1.7   Calcium (mg/dL)  Date Value  11/91/4782 7.4 (L)   Calcium, Total (PTH) (mg/dL)  Date Value  95/62/1308 7.6 (L)   Albumin (g/dL)  Date Value  65/78/4696 2.3 (L)   Phosphorus (mg/dL)  Date Value  29/52/8413 4.8 (H)   Sodium (mmol/L)  Date Value  06/02/2023 121 (L)   Height: 5\' 2"  (157.5 cm) Weight: 75.3 kg (166 lb 0.1 oz) IBW/kg (Calculated) : 54.6 Estimated Creatinine Clearance: 12.8 mL/min (A) (by C-G formula based on SCr of 3.81 mg/dL (H)).  Assessment  Robert Lucas is a 82 y.o. male presenting with sepsis. PMH significant for CAD, MI, HTN, HLD, systolic / diastolic heart failure EF < 20%, new diagnosis Afib on Eliquis, OSA, former smoker, diabetes,  ESRD on HD MWF, anemia, thrombocytopenia, chronic hyponatremia,  and gout . Pharmacy has been consulted to monitor and replace electrolytes.  Diet: NPO MIVF: N/A Pertinent medications: N/A  Goal of Therapy: Electrolytes within normal limits  Plan:  Na 121, NaCl 1 g BIDM Mg 1.7, magnesium sulfate 2 g IV x 1 Follow-up electrolytes with AM labs  Thank you for allowing pharmacy to be a part of this patient's care.  Tressie Ellis 06/02/2023 7:14 AM

## 2023-06-02 NOTE — H&P (Signed)
NAME:  Robert Lucas, MRN:  [id number], DOB:  1940/10/13, LOS: 1 ADMISSION DATE:  06/01/2023, CONSULTATION DATE:  06/02/2023 REFERRING MD:  Dr. Clyde Lundborg CHIEF COMPLAINT:  Fever, AMS, concern for Sepsis   History of Present Illness:  Robert Lucas is a 82 y.o. male with an extensive past medical history significant for CAD, MI, HTN, HLD, Systolic/diastolic heart failure EF < 20%, new diagnosis Afib on Eliquis, OSA, Former Smoker, Diabetes Type 2,  ESRD on HD MWF, Anemia, Thrombocytopenia, Chronic hyponatremia,  and Gout that presented 06/01/2023 from his SNF with Altered mental status, fevers, and hypotension. Patient was recently admitted to [facility] 11/15 - 11/27 with new onset A-fib w/ RVR. He was hypotensive and hypoxic. Underwent TEE w. Cardioversion, now maintains on Eliquis, amiodarone, and midodrine. He was discharged to a SNF for rehab. Daughter states patient was improving on Thursday, was up walking with therapy, eating well. 11/29 family visited him after dialysis, he was confused and began vomiting. Shortly thereafter he was noted to be hypotensive, hypoxic requiring 4 Liters [place], EMS was called and patient transported to [facility] ER for evaluation. Upon arrival he was minimally responsive, not following commands, minimally moving extremities to painful stimulation per chart review.    ED course: Upon arrival patients BP 129/67, Tachycardic 112, Temp 100.4 axillary, 100% on 4 Liters [place], Cultures were ordered and antibiotics started in the ER. Head CT completed and negative for acute pathology. CXR with Cardiac enlargement with mild perihilar infiltration, minimally progressing since prior study. Pulm edema vs. PneumoniaHe was admitted to the progressive care unit for further evaluation. Upon arrival he was hypotensive with a questionable systolic blood pressure of 50 for which a rapid response was called requiring transfer to the Medical ICU.  Medications given: Vancomycin, Azithromycin and  Ceftriaxone  Significant labs: (Labs/ Imaging personally reviewed) EKG Interpretation: Pending Chemistry: Na+:123, K+: 3.4, BUN/Cr.: 51/3.6, Serum CO2/ AG: 23/13 Hematology: WBC: 3.3, Hgb: 10.4, Plts 97 Lactic/ PCT: 2.0/5.17, COVID-19 & Influenza A/B: Negative ABG: 7.33/56/29.5/52 CXR: Cardiac enlargement with mild perihilar infiltration, minimally progressing since prior study. Pulm edema vs. Pneumonia  PCCM consulted for admission due to Hypotension.  Pertinent  Medical History  CAD, MI, HTN, HLD, Systolic/diastolic heart failure EF < 20%, new diagnosis Afib on Eliquis, OSA, Former Smoker, Diabetes Type 2,  ESRD on HD MWF, Anemia, Thrombocytopenia, Chronic hyponatremia,  and Gout  Significant Hospital Events: Including procedures, antibiotic start and stop dates in addition to other pertinent events   11/30: Started on broad spectrum antibiotics Vancomycin, Azithromycin and Ceftriaxone. Which are now transitioned to Cefepime and Vancomycin. Blood cultures, UA, MRSA swab, and sputum (if able to obtain) are pending.  Levophed infusion to achieve MAP > 65, fluid bolus given, stress dose steroids. Central and arterial lines placed. Bipap initiated.   Interim History / Subjective:  Upon arrival to the ICU patient would open his eyes to light sternal rub, not following commands. ABG 7.27/53/24.3/48 for which patient was placed on Bipap. Blood pressure labile, remains on levophed. Central and arterial lines placed for access and hemodynamic monitoring. Antibiotics transitioned to Vancomycin and Cefepime. Repeat ABG 7.31/48/24.2/116, patient awakening and starting to follow commands. Initially called daughter without answer, she has since returned my call and provided all history.   Objective   Blood pressure (!) 73/37, pulse 94, temperature (!) 100.4 F (38 C), temperature source Axillary, resp. rate 14, height 5\' 2"  (1.575 m), weight 75.3 kg, SpO2 100%.    FiO2 (%):  [35 %]  35 %    Intake/Output Summary (Last 24 hours) at 06/02/2023 0220 Last data filed at 06/01/2023 2012 Gross per 24 hour  Intake 339.32 ml  Output --  Net 339.32 ml   Filed Weights   06/01/23 1803  Weight: 75.3 kg   Examination: General: Acutely-ill appearing male HENT: Supple, +JVD  Lungs: Course throughout, upper airway congestion, wet cough Cardiovascular: Tachycardic, BUE pulses +1, BLE faint due to +3 pitting edema, RUE +2 pitting edema with oozing, LUE +3 pitting edema, erythema, and fistula site wrapped with bandage Abdomen: BS x 4, soft, non tender, ecchymosis from SQ injections Extremities: Normal tone, Moves to noxious stimulation Neuro: Obtunded, opens eyes to noxious stimulation, does not maintain arousal, not following commands, PERRL 3, +cough/gag GU: Foley with dark amber urine  Assessment & Plan:  Severe Sepsis in the setting of suspected HCAP vs. Cellulitis of LUE  -IVF bolus (used cautiously in the setting of ESRD and respiratory status with suspected fluid overload) -Hydrocortisone 50 mg Q 6 hours -Antibiotics: Vancomycin and Cefepime       -Azithromycin and Ceftriaxone in ER -Cultures pending: BC x 2, Sputum, MRSA swab, UA, Urine legionella and S. Pneumococcal antigen -Cultures negative: Covid and Viral panel -Procalcitonin 5.4, Lactic acid 2.0/1.5, will trend in AM, send CRP and ESR  Acute Multifactorial Encephalopathy in the setting of sepsis, metabolic derangements, hypoxia, and hypercapnea  -CT head negative for acute pathology -Send Ammonia level -Neuro checks Q 4 hours -Fall precautions  Acute Hypoxic and Hypercapnic Respiratory Failure in the setting of suspected HCAP requiring Bipap -Bipap to continue overnight, consider transition to nasal canula in AM -Titrate FiO2 to maintain SpO2 > 92% -Trend ABG Q 4 hours -Duonebs Q 6 hours  HFrEF; TEE 05/24/2023 showed EF < 20%  Hx: Atrial Fibrillation s/p cardioversion, Hyperlipidemia, Essential Hypertension,  CAD/MI -Levophed infusion, titrate to MAP goal > 65, arterial line in place -Resume home Midodrine 10 mg TID,  Amiodarone 200 mg daily, Eliquis 2.5 mg BID, and Lipitor 40 mg nightly when able to take PO -Volume management per renal by dialysis  Type II diabetes mellitus with renal manifestations: Recent A1c 8.8.  Patient currently only using NovoLog at home, no longer taking 70/30 insulin. -Sliding scale insulin, increase as needed  ESRD on dialysis (MWF) -Consulted Dr. Thedore Mins of nephrology for dialysis  Pancytopenia, Chronic/Stable -Follow-up with daily CBC  Hyponatremia, Chronic/Stable -Trend Daily, currently 123 (124 on 11/20) -Continue Salt tabs 1 gram BID once taking PO -Urine sodium and osmolarity pending   Hypokalemia -Follow-up with BMP -Replace with caution in the setting of ESRD  BPH -Continue Flomax 0.4 mg daily once taking PO  Gout -Resume allopurinol once taking PO  Obesity (BMI 30-39.9): Body weight 75.3 kg, BMI 30.36 -When patient mental status improves, will need counseling on exercise and healthy diet, and encourage weight loss  Best Practice (right click and "Reselect all SmartList Selections" daily)   Diet/type: NPO DVT prophylaxis: apixaban (ELIQUIS) tablet 2.5 mg Start: 06/02/23 0800 SCDs Start: 06/02/23 0116 Place and maintain sequential compression device Start: 06/01/23 2313 apixaban (ELIQUIS) tablet 2.5 mg   Pressure ulcer(s): pressure ulcer assessment deferred  due to emergent need for procedures GI prophylaxis: PPI Lines: Central line and yes and it is still needed Foley:  Yes, and it is still needed Code Status:  full code, confirmed with daughter  Labs   CBC: Recent Labs  Lab 05/27/23 0305 05/27/23 2047 05/28/23 0257 05/29/23 0316 05/30/23 0706 06/01/23 1819  WBC  6.2  --  6.0 5.2 4.9 3.3*  NEUTROABS  --   --   --   --   --  2.4  HGB 10.4* 10.2* 10.5* 11.2* 10.3* 10.4*  HCT 30.0* 29.4* 29.4* 32.2* 29.5* 30.2*  MCV 97.7  --  98.0  98.8 98.3 99.7  PLT 65*  --  78* 88* 96* 97*    Basic Metabolic Panel: Recent Labs  Lab 05/26/23 0228 05/27/23 0305 05/28/23 0257 05/28/23 1328 05/29/23 0316 05/30/23 0706 06/01/23 1819 06/01/23 2357  NA 122* 126* 118* 125* 123* 124* 123* 121*  K 3.8 3.8 3.7 3.6 3.8 3.7 3.4* 3.3*  CL 87* 91* 86* 89* 88* 89* 87* 86*  CO2 23 22 22 24 23 23 23 22   GLUCOSE 185* 121* 125* 223* 185* 145* 186* 173*  BUN 72* 47* 71* 37* 52* 60* 51* 59*  CREATININE 4.74* 3.52* 4.41* 2.89* 3.48* 4.25* 3.60* 3.86*  CALCIUM 7.8* 8.0* 8.1* 8.2* 8.2* 8.5* 8.0* 7.4*  MG 1.8 1.5* 2.1  --   --  1.8  --   --   PHOS 5.7* 3.8 5.0* 3.3  --  3.9 3.3  --    GFR: Estimated Creatinine Clearance: 12.7 mL/min (A) (by C-G formula based on SCr of 3.86 mg/dL (H)). Recent Labs  Lab 05/27/23 0305 05/28/23 0257 05/29/23 0316 05/30/23 0706 06/01/23 1819 06/01/23 2007  PROCALCITON  --   --   --   --  5.17  --   WBC 6.2 6.0 5.2 4.9 3.3*  --   LATICACIDVEN 1.2  --   --   --  2.0* 1.5    Liver Function Tests: Recent Labs  Lab 05/28/23 1328 06/01/23 1819  AST  --  25  ALT  --  27  ALKPHOS  --  209*  BILITOT  --  1.6*  PROT  --  5.6*  ALBUMIN 2.3* 2.6*   ABG    Component Value Date/Time   PHART 7.27 (L) 06/02/2023 0010   PCO2ART 53 (H) 06/02/2023 0010   PO2ART 48 (L) 06/02/2023 0010   HCO3 24.3 06/02/2023 0010   TCO2 28 06/30/2022 1600   ACIDBASEDEF 3.3 (H) 06/02/2023 0010   O2SAT 84.7 06/02/2023 0010    Coagulation Profile: Recent Labs  Lab 06/01/23 1819  INR 1.3*   HbA1C: Hgb A1c MFr Bld  Date/Time Value Ref Range Status  12/18/2022 07:17 AM 8.8 (H) 4.8 - 5.6 % Final    Comment:    (NOTE) Pre diabetes:          5.7%-6.4%  Diabetes:              >6.4%  Glycemic control for   <7.0% adults with diabetes   06/30/2022 04:15 PM 8.0 (H) 4.8 - 5.6 % Final    Comment:    (NOTE) Pre diabetes:          5.7%-6.4%  Diabetes:              >6.4%  Glycemic control for   <7.0% adults with  diabetes    CBG: Recent Labs  Lab 05/29/23 1548 05/30/23 0621 05/30/23 1146 06/01/23 2344 06/02/23 0002  GLUCAP 130* 151* 107* 163* 194*   Review of Systems:   Unable to obtain due to mental status  Past Medical History:  He,  has a past medical history of Arthritis, CHF (congestive heart failure) (HCC), CKD (chronic kidney disease), Coronary artery disease, Diabetes (HCC), ESRD (end stage renal disease) on dialysis (HCC), Hypercholesteremia,  Hypertension, Myocardial infarction (HCC) (1990), Neuromuscular disorder (HCC), Pneumonia, and PONV (postoperative nausea and vomiting).   Surgical History:   Past Surgical History:  Procedure Laterality Date   AV FISTULA PLACEMENT Left 05/17/2021   Procedure: LEFT ARM ARTERIOVENOUS (AV) FISTULA CREATION;  Surgeon: Victorino Sparrow, MD;  Location[facility] OR;  Service: Vascular;  Laterality: Left;  PERIPHERAL NERVE BLOCK   AV FISTULA PLACEMENT Left 05/02/2022   Procedure: INSERTION OF LEFT ARTERIOVENOUS (AV) GORE-TEX GRAFT;  Surgeon: Victorino Sparrow, MD;  Location: [facility] OR;  Service: Vascular;  Laterality: Left;   BACK SURGERY  yrs ago   lower   CARDIAC CATHETERIZATION     CARDIOVERSION N/A 05/24/2023   Procedure: CARDIOVERSION (CATH LAB);  Surgeon: Jake Bathe, MD;  Location: [facility] INVASIVE CV LAB;  Service: Cardiovascular;  Laterality: N/A;   CATARACT EXTRACTION Right 01/2021   CHOLECYSTECTOMY N/A 05/02/2017   Procedure: LAPAROSCOPIC CHOLECYSTECTOMY;  Surgeon: Axel Filler, MD;  Location: [facility] OR;  Service: General;  Laterality: N/A;   CORONARY ANGIOPLASTY     2 1990   EYE SURGERY Left 03/2021   cataract removal   IR FLUORO GUIDE CV LINE RIGHT  10/15/2021   IR US GUIDE VASC ACCESS RIGHT  10/15/2021   KNEE SURGERY Left yrs ago   arthroscopic   LIGATION OF COMPETING BRANCHES OF ARTERIOVENOUS FISTULA Left 11/22/2021   Procedure: LEFT ARM FISTULA BRANCH LIGATION;  Surgeon: Victorino Sparrow, MD;  Location: [facility] OR;  Service: Vascular;   Laterality: Left;   PERIPHERAL VASCULAR BALLOON ANGIOPLASTY  10/19/2021   Procedure: PERIPHERAL VASCULAR BALLOON ANGIOPLASTY;  Surgeon: Victorino Sparrow, MD;  Location: [facility] INVASIVE CV LAB;  Service: Cardiovascular;;  Left AVF   ROTATOR CUFF REPAIR Left yrs ago   SHOULDER OPEN ROTATOR CUFF REPAIR Right 04/23/2013   Procedure: RIGHT SHOULDER ROTATOR CUFF REPAIR WITH GRAFT AND ANCHORS ;  Surgeon: Jacki Cones, MD;  Location: [facility] ORS;  Service: Orthopedics;  Laterality: Right;   TONSILLECTOMY     TOTAL HIP ARTHROPLASTY Right 05/06/2019   Procedure: TOTAL HIP ARTHROPLASTY ANTERIOR APPROACH;  Surgeon: Durene Romans, MD;  Location: [facility] ORS;  Service: Orthopedics;  Laterality: Right;  70 mins   TRANSESOPHAGEAL ECHOCARDIOGRAM (CATH LAB) N/A 05/24/2023   Procedure: TRANSESOPHAGEAL ECHOCARDIOGRAM;  Surgeon: Jake Bathe, MD;  Location: [facility] INVASIVE CV LAB;  Service: Cardiovascular;  Laterality: N/A;     Social History:  Chart review reveals  reports that he quit smoking about 34 years ago. His smoking use included cigars. He has never been exposed to tobacco smoke. He has never used smokeless tobacco. He reports that he does not currently use alcohol. He reports that he does not use drugs.   Family History:  His family history includes Dementia in his mother; Stroke in his brother.   Allergies Allergies  Allergen Reactions   Zestril [Lisinopril] Cough   Hydrocodone Nausea Only   Januvia [Sitagliptin] Other (See Comments)    Unknown   Levitra [Vardenafil] Other (See Comments)    flushing   Prednisone Other (See Comments)    Elevates BGL; patient prefers to not take this   Victoza [Liraglutide] Other (See Comments)    GI upset   Penicillins Rash and Other (See Comments)    Rash around ankles Has patient had a PCN reaction causing immediate rash, facial/tongue/throat swelling, SOB or lightheadedness with hypotension: Yes Has patient had a PCN reaction causing severe rash involving mucus  membranes or skin necrosis: Unk Has patient had a  PCN reaction that required hospitalization: No Has patient had a PCN reaction occurring within the last 10 years: No If all of the above answers are "NO", then may proceed with Cephalosporin use.     Home Medications  Prior to Admission medications   Medication Sig Start Date End Date Taking? Authorizing Provider  acetaminophen (TYLENOL) 650 MG CR tablet Take 1,300 mg by mouth in the morning, at noon, and at bedtime.   Yes [provider]  allopurinol (ZYLOPRIM) 100 MG tablet Take 100 mg by mouth in the morning.   Yes [provider]  amiodarone (PACERONE) 200 MG tablet Take 1 tablet (200 mg total) by mouth daily. 05/31/23  Yes Noralee Stain, DO  apixaban (ELIQUIS) 2.5 MG TABS tablet Take 1 tablet (2.5 mg total) by mouth 2 (two) times daily. 05/30/23  Yes Noralee Stain, DO  atorvastatin (LIPITOR) 40 MG tablet Take 40 mg by mouth at bedtime.   Yes [provider]  dextromethorphan-guaiFENesin (MUCINEX DM) 30-600 MG 12hr tablet Take 1 tablet by mouth 2 (two) times daily. 05/30/23 07/06/23 Yes [provider]  insulin aspart (NOVOLOG FLEXPEN) 100 UNIT/ML FlexPen Inject 0-14 Units into the skin 4 (four) times daily -  before meals and at bedtime. Per sliding scale, subcutaneous, before meals and at bedtime, if blood sugar is 201-250, gave 2 units. If blood sugar is 251-300, give 4 units. If blood sugar is 301-350, give 6 units. If blood sugar is 351-400, give 8 units. If blood sugar is 401-450, give 10 units. If blood sugar is 451-500, give 12 units. If glucometer reads >500 or "high" give 14 units of Novolog. Recheck blood sugar in 2 hours, at that time if blood sugar is >400 or <80 call PEC Triage.   Yes [provider]  ipratropium-albuterol (DUONEB) 0.5-2.5 (3) MG/3ML SOLN Inhale 3 mLs into the lungs 2 (two) times daily. 05/30/23 07/06/23 Yes [provider]  midodrine (PROAMATINE) 10 MG tablet Take  1 tablet (10 mg total) by mouth 3 (three) times daily with meals. 05/30/23  Yes Noralee Stain, DO  tamsulosin (FLOMAX) 0.4 MG CAPS capsule Take 1 capsule (0.4 mg total) by mouth daily after supper. 07/11/20  Yes Albertine Grates, MD  Wound Cleansers (VASHE WOUND) 0.033 % SOLN Apply 1 Application topically daily. Small amount, irrigation, once a day, cleanse left toe wound with VASHE. Apply VASHE moist 2x2 to wound bed. Wrap with kerlix and tape. Change daily   Yes [provider]  insulin aspart protamine - aspart (NOVOLOG MIX 70/30 FLEXPEN) (70-30) 100 UNIT/ML FlexPen Inject 14-20 Units into the skin daily as needed (high blood sugar). If BS is <150=0 units, If BS is 150-200 units=14 units, If BS is >201=20 units Patient not taking: Reported on 06/01/2023    [provider]     Critical care time: 98 minutes

## 2023-06-03 ENCOUNTER — Inpatient Hospital Stay (HOSPITAL_COMMUNITY)
Admit: 2023-06-03 | Discharge: 2023-06-03 | Disposition: A | Discharge status: Home / Self Care | Payer: Medicare Other | Attending: Pulmonary Disease | Admitting: Pulmonary Disease

## 2023-06-03 DIAGNOSIS — R7881 Bacteremia: Secondary | ICD-10-CM | POA: Diagnosis not present

## 2023-06-03 DIAGNOSIS — J189 Pneumonia, unspecified organism: Secondary | ICD-10-CM | POA: Diagnosis not present

## 2023-06-03 LAB — BLOOD GAS, ARTERIAL
Acid-base deficit: 0.2 mmol/L (ref 0.0–2.0)
Bicarbonate: 25.4 mmol/L (ref 20.0–28.0)
O2 Saturation: 95.4 %
Patient temperature: 37
pCO2 arterial: 44 mm[Hg] (ref 32–48)
pH, Arterial: 7.37 (ref 7.35–7.45)
pO2, Arterial: 65 mm[Hg] — ABNORMAL LOW (ref 83–108)

## 2023-06-03 LAB — COMPREHENSIVE METABOLIC PANEL
ALT: 32 U/L (ref 0–44)
AST: 64 U/L — ABNORMAL HIGH (ref 15–41)
Albumin: 2.5 g/dL — ABNORMAL LOW (ref 3.5–5.0)
Alkaline Phosphatase: 146 U/L — ABNORMAL HIGH (ref 38–126)
Anion gap: 14 (ref 5–15)
BUN: 40 mg/dL — ABNORMAL HIGH (ref 8–23)
CO2: 22 mmol/L (ref 22–32)
Calcium: 8 mg/dL — ABNORMAL LOW (ref 8.9–10.3)
Chloride: 91 mmol/L — ABNORMAL LOW (ref 98–111)
Creatinine, Ser: 2.83 mg/dL — ABNORMAL HIGH (ref 0.61–1.24)
GFR, Estimated: 21 mL/min — ABNORMAL LOW (ref >60)
Glucose, Bld: 218 mg/dL — ABNORMAL HIGH (ref 70–99)
Potassium: 3.5 mmol/L (ref 3.5–5.1)
Sodium: 127 mmol/L — ABNORMAL LOW (ref 135–145)
Total Bilirubin: 1.2 mg/dL — ABNORMAL HIGH (ref <1.2)
Total Protein: 5.4 g/dL — ABNORMAL LOW (ref 6.5–8.1)

## 2023-06-03 LAB — GLUCOSE, CAPILLARY
Glucose-Capillary: 105 mg/dL — ABNORMAL HIGH (ref 70–99)
Glucose-Capillary: 113 mg/dL — ABNORMAL HIGH (ref 70–99)
Glucose-Capillary: 155 mg/dL — ABNORMAL HIGH (ref 70–99)
Glucose-Capillary: 198 mg/dL — ABNORMAL HIGH (ref 70–99)
Glucose-Capillary: 207 mg/dL — ABNORMAL HIGH (ref 70–99)
Glucose-Capillary: 210 mg/dL — ABNORMAL HIGH (ref 70–99)

## 2023-06-03 LAB — ECHOCARDIOGRAM COMPLETE
AR max vel: 1.43 cm2
AV Area VTI: 1.54 cm2
AV Area mean vel: 1.51 cm2
AV Mean grad: 13 mm[Hg]
AV Peak grad: 23.2 mm[Hg]
Ao pk vel: 2.41 m/s
Area-P 1/2: 4.29 cm2
Calc EF: 36.9 %
Height: 62 in
S' Lateral: 4.3 cm
Single Plane A2C EF: 38 %
Single Plane A4C EF: 36.1 %
Weight: 2610.25 [oz_av]

## 2023-06-03 LAB — CBC
HCT: 24.1 % — ABNORMAL LOW (ref 39.0–52.0)
Hemoglobin: 8.4 g/dL — ABNORMAL LOW (ref 13.0–17.0)
MCH: 34.3 pg — ABNORMAL HIGH (ref 26.0–34.0)
MCHC: 34.9 g/dL (ref 30.0–36.0)
MCV: 98.4 fL (ref 80.0–100.0)
Platelets: 86 10*3/uL — ABNORMAL LOW (ref 150–400)
RBC: 2.45 MIL/uL — ABNORMAL LOW (ref 4.22–5.81)
RDW: 16.8 % — ABNORMAL HIGH (ref 11.5–15.5)
WBC: 5.2 10*3/uL (ref 4.0–10.5)
nRBC: 0 % (ref 0.0–0.2)

## 2023-06-03 LAB — HEPARIN LEVEL (UNFRACTIONATED): Heparin Unfractionated: >1.1 [IU]/mL — ABNORMAL HIGH (ref 0.30–0.70)

## 2023-06-03 LAB — PHOSPHORUS: Phosphorus: 3.4 mg/dL (ref 2.5–4.6)

## 2023-06-03 LAB — CALCIUM, IONIZED: Calcium, Ionized, Serum: 4.3 mg/dL — ABNORMAL LOW (ref 4.5–5.6)

## 2023-06-03 LAB — APTT
aPTT: 57 s — ABNORMAL HIGH (ref 24–36)
aPTT: >200 s (ref 24–36)

## 2023-06-03 LAB — MAGNESIUM: Magnesium: 2 mg/dL (ref 1.7–2.4)

## 2023-06-03 MED ORDER — HEPARIN (PORCINE) 25000 UT/250ML-% IV SOLN
950.0000 [IU]/h | INTRAVENOUS | Status: DC
  Administered 2023-06-04: 950 [IU]/h via INTRAVENOUS
  Filled 2023-06-03: qty 250

## 2023-06-03 MED ORDER — HYDROXYZINE HCL 25 MG PO TABS
25.0000 mg | ORAL_TABLET | Freq: Once | ORAL | Status: AC
  Administered 2023-06-03: 25 mg via ORAL
  Filled 2023-06-03: qty 1

## 2023-06-03 MED ORDER — POTASSIUM CHLORIDE CRYS ER 20 MEQ PO TBCR
40.0000 meq | EXTENDED_RELEASE_TABLET | Freq: Once | ORAL | Status: AC
Start: 2023-06-03 — End: 2023-06-03
  Administered 2023-06-03: 20 meq via ORAL
  Filled 2023-06-03: qty 2

## 2023-06-03 MED ORDER — EPOETIN ALFA-EPBX 10000 UNIT/ML IJ SOLN
4000.0000 [IU] | INTRAMUSCULAR | Status: DC
  Administered 2023-06-06: 4000 [IU] via SUBCUTANEOUS
  Filled 2023-06-03: qty 1
  Filled 2023-06-03: qty 0.4
  Filled 2023-06-03: qty 1

## 2023-06-03 MED ORDER — ALBUMIN HUMAN 25 % IV SOLN
25.0000 g | Freq: Once | INTRAVENOUS | Status: DC
  Administered 2023-06-06: 25 g via INTRAVENOUS

## 2023-06-03 NOTE — Progress Notes (Signed)
[facility] [place], [place] 06/03/23  Subjective:   Hospital day # 2 Robert Lucas is a 82 y.o. male  hx of sCHF with EF< 20%, A fib on Eliquis, HTN, HLD, DM, CAD, gout, BPH, anemia, Bell's palsy, obesity, OSA not using CPAP, former smoker, chronic hyponatremia, admitted due to sepsis and HCAP,    Urgent hemodialysis on Saturday.  2500 cc fluid was removed successfully.  Today he is much more alert and appears to be back to his baseline.  Denies any acute complaints. Still has significant fluid overload.   Objective:  Vital signs in last 24 hours:  Temp:  [97.5 F (36.4 C)-98.6 F (37 C)] 97.7 F (36.5 C) (12/01 1221) Pulse Rate:  [72-93] 79 (12/01 1221) Resp:  [12-25] 14 (12/01 1221) BP: (113-154)/(47-95) 119/57 (12/01 1221) SpO2:  [87 %-100 %] 100 % (12/01 1221) Arterial Line BP: (104-158)/(40-70) 137/62 (12/01 1100) Weight:  [73.9 kg-75.3 kg] 74 kg (12/01 0411)  Weight change: 0.003 kg Filed Weights   06/02/23 1800 06/02/23 2207 06/03/23 0411  Weight: 75.3 kg 73.9 kg 74 kg    Intake/Output:    Intake/Output Summary (Last 24 hours) at 06/03/2023 1252 Last data filed at 06/03/2023 0900 Gross per 24 hour  Intake 581.25 ml  Output 2520 ml  Net -1938.75 ml     Physical Exam: General: Lying in the bed, no acute distress  HEENT Moist oral mucous membranes  Pulm/lungs [place] O2, coarse breath sounds  CVS/Heart Tachycardic, irregular  Abdomen:  Mildly distended  Extremities: Dependent edema present  Neurologic: Alert, able to answer questions appropriately  Skin: Bruising over multiple areas  Access: Left upper extremity AV fistula       Basic Metabolic Panel:  Recent Labs  Lab 05/28/23 0257 05/28/23 1328 05/29/23 0316 05/30/23 0706 06/01/23 1819 06/01/23 2357 06/02/23 0213 06/02/23 1400 06/02/23 2150 06/03/23 0350  NA 118* 125*   < > 124* 123* 121* 121* 123* 127* 127*  K 3.7 3.6   < > 3.7 3.4* 3.3* 3.5 3.9 3.2* 3.5  CL 86* 89*   < >  89* 87* 86* 86* 89* 92* 91*  CO2 22 24   < > 23 23 22  21* 20* 24 22  GLUCOSE 125* 223*   < > 145* 186* 173* 206* 192* 206* 218*  BUN 71* 37*   < > 60* 51* 59* 59* 68* 32* 40*  CREATININE 4.41* 2.89*   < > 4.25* 3.60* 3.86* 3.81* 4.31* 2.27* 2.83*  CALCIUM 8.1* 8.2*   < > 8.5* 8.0* 7.4* 7.4* 8.1* 8.0* 8.0*  MG 2.1  --   --  1.8  --   --  1.7  --   --  2.0  PHOS 5.0* 3.3  --  3.9 3.3  --  4.8*  --   --  3.4   < > = values in this interval not displayed.     CBC: Recent Labs  Lab 05/29/23 0316 05/30/23 0706 06/01/23 1819 06/02/23 0213 06/03/23 0350  WBC 5.2 4.9 3.3* 7.2 5.2  NEUTROABS  --   --  2.4  --   --   HGB 11.2* 10.3* 10.4* 9.7* 8.4*  HCT 32.2* 29.5* 30.2* 27.9* 24.1*  MCV 98.8 98.3 99.7 98.2 98.4  PLT 88* 96* 97* 100* 86*      Lab Results  Component Value Date   HEPBSAG NON REACTIVE 05/20/2023      Microbiology:  Recent Results (from the past 240 hour(s))  Culture,  blood (x 2)     Status: None (Preliminary result)   Collection Time: 06/01/23  6:22 PM   Specimen: BLOOD  Result Value Ref Range Status   Specimen Description BLOOD BLOOD LEFT ARM  Final   Special Requests   Final    BOTTLES DRAWN AEROBIC AND ANAEROBIC Blood Culture adequate volume   Culture   Final    NO GROWTH 2 DAYS Performed at [facility], [address] [place], [place] [address]    Report Status PENDING  Incomplete  Culture, blood (x 2)     Status: None (Preliminary result)   Collection Time: 06/01/23  6:22 PM   Specimen: BLOOD  Result Value Ref Range Status   Specimen Description   Final    BLOOD NECK Performed at [facility], [address] [place], [place] [address]    Special Requests   Final    BOTTLES DRAWN AEROBIC AND ANAEROBIC Blood Culture adequate volume Performed at [facility], [address] [place], [place] [address]    Culture  Setup Time   Final    GRAM POSITIVE COCCI IN BOTH AEROBIC AND ANAEROBIC BOTTLES CRITICAL RESULT CALLED  TO, READ BACK BY AND VERIFIED WITH: ANDREA DOBBS ON 06/02/23 AT 1126 QSD    Culture   Final    GRAM POSITIVE COCCI IDENTIFICATION TO FOLLOW Performed at [facility], [address] [place], [place] [address]    Report Status PENDING  Incomplete  Blood Culture ID Panel (Reflexed)     Status: Abnormal   Collection Time: 06/01/23  6:22 PM  Result Value Ref Range Status   Enterococcus faecalis NOT DETECTED NOT DETECTED Final   Enterococcus Faecium NOT DETECTED NOT DETECTED Final   Listeria monocytogenes NOT DETECTED NOT DETECTED Final   Staphylococcus species DETECTED (A) NOT DETECTED Final    Comment: CRITICAL RESULT CALLED TO, READ BACK BY AND VERIFIED WITH: ANDREA DOBBS ON 06/02/23 AT 1126 QSD    Staphylococcus aureus (BCID) NOT DETECTED NOT DETECTED Final   Staphylococcus epidermidis NOT DETECTED NOT DETECTED Final   Staphylococcus lugdunensis NOT DETECTED NOT DETECTED Final   Streptococcus species NOT DETECTED NOT DETECTED Final   Streptococcus agalactiae NOT DETECTED NOT DETECTED Final   Streptococcus pneumoniae NOT DETECTED NOT DETECTED Final   Streptococcus pyogenes NOT DETECTED NOT DETECTED Final   A.calcoaceticus-baumannii NOT DETECTED NOT DETECTED Final   Bacteroides fragilis NOT DETECTED NOT DETECTED Final   Enterobacterales NOT DETECTED NOT DETECTED Final   Enterobacter cloacae complex NOT DETECTED NOT DETECTED Final   Escherichia coli NOT DETECTED NOT DETECTED Final   Klebsiella aerogenes NOT DETECTED NOT DETECTED Final   Klebsiella oxytoca NOT DETECTED NOT DETECTED Final   Klebsiella pneumoniae NOT DETECTED NOT DETECTED Final   Proteus species NOT DETECTED NOT DETECTED Final   Salmonella species NOT DETECTED NOT DETECTED Final   Serratia marcescens NOT DETECTED NOT DETECTED Final   Haemophilus influenzae NOT DETECTED NOT DETECTED Final   Neisseria meningitidis NOT DETECTED NOT DETECTED Final   Pseudomonas aeruginosa NOT DETECTED NOT DETECTED Final    Stenotrophomonas maltophilia NOT DETECTED NOT DETECTED Final   Candida albicans NOT DETECTED NOT DETECTED Final   Candida auris NOT DETECTED NOT DETECTED Final   Candida glabrata NOT DETECTED NOT DETECTED Final   Candida krusei NOT DETECTED NOT DETECTED Final   Candida parapsilosis NOT DETECTED NOT DETECTED Final   Candida tropicalis NOT DETECTED NOT DETECTED Final   Cryptococcus neoformans/gattii NOT DETECTED NOT DETECTED  Final    Comment: Performed at [facility], [address] [place], [place] [address]  Resp panel by RT-PCR (RSV, Flu A&B, Covid) Anterior Nasal Swab     Status: None   Collection Time: 06/01/23  7:14 PM   Specimen: Anterior Nasal Swab  Result Value Ref Range Status   SARS Coronavirus 2 by RT PCR NEGATIVE NEGATIVE Final    Comment: (NOTE) SARS-CoV-2 target nucleic acids are NOT DETECTED.  The SARS-CoV-2 RNA is generally detectable in upper respiratory specimens during the acute phase of infection. The lowest concentration of SARS-CoV-2 viral copies this assay can detect is 138 copies/mL. A negative result does not preclude SARS-Cov-2 infection and should not be used as the sole basis for treatment or other patient management decisions. A negative result may occur with  improper specimen collection/handling, submission of specimen other than nasopharyngeal swab, presence of viral mutation(s) within the areas targeted by this assay, and inadequate number of viral copies(<138 copies/mL). A negative result must be combined with clinical observations, patient history, and epidemiological information. The expected result is Negative.  Fact Sheet for Patients:  BloggerCourse.com  Fact Sheet for Healthcare Providers:  SeriousBroker.it  This test is no t yet approved or cleared by the [place] FDA and  has been authorized for detection and/or diagnosis of SARS-CoV-2 by FDA under an Emergency Use  Authorization (EUA). This EUA will remain  in effect (meaning this test can be used) for the duration of the COVID-19 declaration under Section 564(b)(1) of the Act, 21 U.S.C.section 360bbb-3(b)(1), unless the authorization is terminated  or revoked sooner.       Influenza A by PCR NEGATIVE NEGATIVE Final   Influenza B by PCR NEGATIVE NEGATIVE Final    Comment: (NOTE) The Xpert Xpress SARS-CoV-2/FLU/RSV plus assay is intended as an aid in the diagnosis of influenza from Nasopharyngeal swab specimens and should not be used as a sole basis for treatment. Nasal washings and aspirates are unacceptable for Xpert Xpress SARS-CoV-2/FLU/RSV testing.  Fact Sheet for Patients: BloggerCourse.com  Fact Sheet for Healthcare Providers: SeriousBroker.it  This test is not yet approved or cleared by the [place] FDA and has been authorized for detection and/or diagnosis of SARS-CoV-2 by FDA under an Emergency Use Authorization (EUA). This EUA will remain in effect (meaning this test can be used) for the duration of the COVID-19 declaration under Section 564(b)(1) of the Act, 21 U.S.C. section 360bbb-3(b)(1), unless the authorization is terminated or revoked.     Resp Syncytial Virus by PCR NEGATIVE NEGATIVE Final    Comment: (NOTE) Fact Sheet for Patients: BloggerCourse.com  Fact Sheet for Healthcare Providers: SeriousBroker.it  This test is not yet approved or cleared by the [place] FDA and has been authorized for detection and/or diagnosis of SARS-CoV-2 by FDA under an Emergency Use Authorization (EUA). This EUA will remain in effect (meaning this test can be used) for the duration of the COVID-19 declaration under Section 564(b)(1) of the Act, 21 U.S.C. section 360bbb-3(b)(1), unless the authorization is terminated or revoked.  Performed at [facility], [address] [place], [place] [address]   MRSA Next Gen by PCR, Nasal     Status: None   Collection Time: 06/02/23  2:11 AM   Specimen: Nasal Mucosa; Nasal Swab  Result Value Ref Range Status   MRSA by PCR Next Gen NOT DETECTED NOT DETECTED Final    Comment: (NOTE) The GeneXpert MRSA Assay (FDA approved for NASAL specimens only), is one  component of a comprehensive MRSA colonization surveillance program. It is not intended to diagnose MRSA infection nor to guide or monitor treatment for MRSA infections. Test performance is not FDA approved in patients less than 23 years old. Performed at [facility], [address] [place], [place] [address]   Culture, blood (Routine X 2) w Reflex to ID Panel     Status: None (Preliminary result)   Collection Time: 06/02/23  1:13 PM   Specimen: BLOOD  Result Value Ref Range Status   Specimen Description BLOOD BLOOD RIGHT HAND  Final   Special Requests   Final    BOTTLES DRAWN AEROBIC ONLY Blood Culture adequate volume   Culture   Final    NO GROWTH < 24 HOURS Performed at [facility], [address] [place], [place] [address]    Report Status PENDING  Incomplete  Culture, blood (Routine X 2) w Reflex to ID Panel     Status: None (Preliminary result)   Collection Time: 06/02/23  2:15 PM   Specimen: BLOOD  Result Value Ref Range Status   Specimen Description BLOOD PICC LINE  Final   Special Requests   Final    BOTTLES DRAWN AEROBIC AND ANAEROBIC Blood Culture adequate volume   Culture   Final    NO GROWTH < 24 HOURS Performed at [facility], [address], [place], [place] [address]    Report Status PENDING  Incomplete    Coagulation Studies: Recent Labs    06/01/23 1819  LABPROT 16.5*  INR 1.3*    Urinalysis: No results for input(s): "COLORURINE", "LABSPEC", "PHURINE", "GLUCOSEU", "HGBUR", "BILIRUBINUR", "KETONESUR", "PROTEINUR", "UROBILINOGEN", "NITRITE", "LEUKOCYTESUR" in the last 72 hours.  Invalid  input(s): "APPERANCEUR"    Imaging: US Venous Img Upper Uni Left (DVT)  Result Date: 06/02/2023 CLINICAL DATA:  Fistula with swelling. EXAM: LEFT UPPER EXTREMITY VENOUS DOPPLER ULTRASOUND TECHNIQUE: Gray-scale sonography with graded compression, as well as color Doppler and duplex ultrasound were performed to evaluate the upper extremity deep venous system from the level of the subclavian vein and including the jugular, axillary, basilic, radial, ulnar and upper cephalic vein. Spectral Doppler was utilized to evaluate flow at rest and with distal augmentation maneuvers. COMPARISON:  None Available. FINDINGS: Contralateral Subclavian Vein: Respiratory phasicity is normal and symmetric with the symptomatic side. No evidence of thrombus. Normal compressibility. Internal Jugular Vein: No evidence of thrombus. Subclavian Vein: No evidence of thrombus Axillary Vein: No evidence of thrombus Cephalic Vein: Not well seen in this postoperative patient. Basilic Vein: No evidence of thrombus Brachial Veins: No evidence of thrombus Radial Veins: No evidence of thrombus Ulnar Veins: Not well seen Fistula in the left arm shows arterial/high flow waveform throughout its extent without detected stricture or outflow stenosis. Regional subcutaneous edema. IMPRESSION: Extensive arm edema without DVT. Unremarkable waveforms at the patient's fistula. Electronically Signed   By: Tiburcio Pea M.D.   On: 06/02/2023 10:53   DG Chest Port 1 View  Result Date: 06/01/2023 CLINICAL DATA:  Question of sepsis to evaluate for abnormality. Altered mental status. EXAM: PORTABLE CHEST 1 VIEW COMPARISON:  05/18/2023 FINDINGS: Shallow inspiration. Cardiac enlargement with mild perihilar infiltration, likely edema but could be pneumonia. No pleural effusions. No pneumothorax. Mediastinal contours appear intact. Calcification of the aorta. IMPRESSION: Cardiac enlargement with mild perihilar infiltration, progressing since prior study.  Electronically Signed   By: Burman Nieves M.D.   On: 06/01/2023 19:06   CT Head Wo Contrast  Result Date: 06/01/2023 CLINICAL  DATA:  Mental status change of unknown cause. Elevated BUN. EXAM: CT HEAD WITHOUT CONTRAST TECHNIQUE: Contiguous axial images were obtained from the base of the skull through the vertex without intravenous contrast. RADIATION DOSE REDUCTION: This exam was performed according to the departmental dose-optimization program which includes automated exposure control, adjustment of the mA and/or kV according to patient size and/or use of iterative reconstruction technique. COMPARISON:  CT head 06/30/2022.  MRI brain 06/30/2022. FINDINGS: Brain: Mild diffuse cerebral atrophy. Low-attenuation changes in the deep white matter consistent with small vessel ischemia. No mass-effect or midline shift. No abnormal extra-axial fluid collections. Gray-white matter junctions are distinct. Basal cisterns are not effaced. No acute intracranial hemorrhage. Vascular: No hyperdense vessel or unexpected calcification. Skull: Normal. Negative for fracture or focal lesion. Sinuses/Orbits: No acute finding. Other: None. IMPRESSION: No acute intracranial abnormalities. Mild chronic atrophy and small vessel ischemic changes. Electronically Signed   By: Burman Nieves M.D.   On: 06/01/2023 19:05     Medications:    ceFEPime (MAXIPIME) IV Stopped (06/02/23 2214)   heparin 1,150 Units/hr (06/03/23 1145)   [START ON 06/04/2023] vancomycin      allopurinol  100 mg Oral Daily   amiodarone  200 mg Oral Daily   atorvastatin  40 mg Oral QHS   Chlorhexidine Gluconate Cloth  6 each Topical Daily   Chlorhexidine Gluconate Cloth  6 each Topical Q0600   hydrocortisone sod succinate (SOLU-CORTEF) inj  100 mg Intravenous BID   insulin aspart  0-15 Units Subcutaneous Q4H   ipratropium-albuterol  3 mL Nebulization Q6H   midodrine  10 mg Oral TID WC   mouth rinse  15 mL Mouth Rinse 4 times per day   pantoprazole  (PROTONIX) IV  40 mg Intravenous Daily   sodium chloride flush  10-40 mL Intracatheter Q12H   sodium chloride flush  3 mL Intravenous Q12H   sodium chloride  1 g Oral BID WC   acetaminophen, acetaminophen, dextromethorphan-guaiFENesin, heparin, ondansetron (ZOFRAN) IV, mouth rinse, polyethylene glycol, sodium chloride flush, traMADol  Assessment/ Plan:  82 y.o. male with  medical problems of  sCHF with EF< 20%, A fib on Eliquis, HTN, HLD, DM, CAD with history of CABG, gout, BPH, anemia, Bell's palsy, obesity, OSA not using CPAP, former smoker, chronic hyponatremia,   admitted on 06/01/2023 for Acute encephalopathy [G93.40] HCAP (healthcare-associated pneumonia) [J18.9] Pneumonia of right lung due to infectious organism, unspecified part of lung [J18.9]   Outpatient dialysis--[facility], [place] Puls, [place] Mircera 50 mcg, Venofer Target weight 68 kg   # ESRD with volume overload. Patient reports that he likes to drink lots of fluids. He has had problems with volume overload as outpatient also. Electrolytes are acceptable. Next hemodialysis is planned for Monday.   # Hyponatremia Due to fluid overload.  Discussed fluid restriction with patient. Expected to improve with correction of volume status.   #Anemia in CKD Receives Mircera and IV Venofer outpatient. Hemoglobin 8.4.   Will add low-dose Epogen to his regimen   # Thrombocytopenia Known at least since April 2023.  Platelets range between 59-140.  #Left upper extremity cellulitis Staphylococcus bacteremia. Patient was evaluated by vascular surgeon on 11/30. Ultrasound of the graft did not show any obvious fluid collection. No indication for removal of AV graft.    LOS: 2 Robert Lucas Thedore Mins 12/1/202412:52 PM  [facility] [place], [place] [phone]  Note: This note was prepared with Dragon dictation. Any transcription errors are unintentional

## 2023-06-03 NOTE — Progress Notes (Signed)
Subjective  -  Feeling better today but still c/o left arm swelling and discomfort   Physical Exam:  Pulsatile left UE AVGG Forearm edema No evidence of compartemnt syndrome   Assessment/Plan:    Sepsis:  suspect primary etiology is his pneumonia, however initial BCX were positive, repeat are NTD.  Can not exclude AVGG infection, however no concrete evidence to remove the graft as there was no significant fluid collection around the graft on u/s and there is no cellulitis on the upper arm.  Will plan for shuntogram tomorrow to look for central stenosis given his significant arm swelling  Wells Robert Lucas 06/03/2023 1:29 PM --  Vitals:   06/03/23 1221 06/03/23 1327  BP: (!) 119/57   Pulse: 79   Resp: 14   Temp: 97.7 F (36.5 C)   SpO2: 100% 100%    Intake/Output Summary (Last 24 hours) at 06/03/2023 1329 Last data filed at 06/03/2023 0900 Gross per 24 hour  Intake 581.25 ml  Output 2520 ml  Net -1938.75 ml     Laboratory CBC    Component Value Date/Time   WBC 5.2 06/03/2023 0350   HGB 8.4 (L) 06/03/2023 0350   HCT 24.1 (L) 06/03/2023 0350   PLT 86 (L) 06/03/2023 0350    BMET    Component Value Date/Time   NA 127 (L) 06/03/2023 0350   K 3.5 06/03/2023 0350   CL 91 (L) 06/03/2023 0350   CO2 22 06/03/2023 0350   GLUCOSE 218 (H) 06/03/2023 0350   BUN 40 (H) 06/03/2023 0350   CREATININE 2.83 (H) 06/03/2023 0350   CALCIUM 8.0 (L) 06/03/2023 0350   CALCIUM 7.6 (L) 08/26/2021 0050   GFRNONAA 21 (L) 06/03/2023 0350   GFRAA 26 (L) 05/07/2019 0234    COAG Lab Results  Component Value Date   INR 1.3 (H) 06/01/2023   INR 1.2 05/19/2023   INR 1.0 06/30/2022   No results found for: "PTT"  Antibiotics Anti-infectives (From admission, onward)    Start     Dose/Rate Route Frequency Ordered Stop   06/04/23 1200  vancomycin (VANCOREADY) IVPB 750 mg/150 mL        750 mg 150 mL/hr over 60 Minutes Intravenous Every M-W-F (Hemodialysis) 06/01/23 2020      06/02/23 1800  vancomycin (VANCOREADY) IVPB 750 mg/150 mL  Status:  Discontinued        750 mg 150 mL/hr over 60 Minutes Intravenous  Once 06/02/23 1430 06/02/23 1431   06/02/23 1800  vancomycin (VANCOREADY) IVPB 750 mg/150 mL        750 mg 150 mL/hr over 60 Minutes Intravenous  Once 06/02/23 1431 06/02/23 2137   06/01/23 2200  ceFEPIme (MAXIPIME) 1 g in sodium chloride 0.9 % 100 mL IVPB        1 g 200 mL/hr over 30 Minutes Intravenous Every 24 hours 06/01/23 2020     06/01/23 2030  vancomycin (VANCOREADY) IVPB 1500 mg/300 mL        1,500 mg 150 mL/hr over 120 Minutes Intravenous  Once 06/01/23 2020 06/02/23 0334   06/01/23 1830  cefTRIAXone (ROCEPHIN) 2 g in sodium chloride 0.9 % 100 mL IVPB  Status:  Discontinued        2 g 200 mL/hr over 30 Minutes Intravenous Every 24 hours 06/01/23 1822 06/01/23 2009   06/01/23 1830  azithromycin (ZITHROMAX) 500 mg in sodium chloride 0.9 % 250 mL IVPB  Status:  Discontinued        500 mg  250 mL/hr over 60 Minutes Intravenous Every 24 hours 06/01/23 1822 06/01/23 2009        V. Charlena Cross, M.D., [facility] [place] Office: [phone] Pager:  [phone]

## 2023-06-03 NOTE — H&P (View-Only) (Signed)
Subjective  -  Feeling better today but still c/o left arm swelling and discomfort   Physical Exam:  Pulsatile left UE AVGG Forearm edema No evidence of compartemnt syndrome   Assessment/Plan:    Sepsis:  suspect primary etiology is his pneumonia, however initial BCX were positive, repeat are NTD.  Can not exclude AVGG infection, however no concrete evidence to remove the graft as there was no significant fluid collection around the graft on u/s and there is no cellulitis on the upper arm.  Will plan for shuntogram tomorrow to look for central stenosis given his significant arm swelling  Wells Robert Lucas 06/03/2023 1:29 PM --  Vitals:   06/03/23 1221 06/03/23 1327  BP: (!) 119/57   Pulse: 79   Resp: 14   Temp: 97.7 F (36.5 C)   SpO2: 100% 100%    Intake/Output Summary (Last 24 hours) at 06/03/2023 1329 Last data filed at 06/03/2023 0900 Gross per 24 hour  Intake 581.25 ml  Output 2520 ml  Net -1938.75 ml     Laboratory CBC    Component Value Date/Time   WBC 5.2 06/03/2023 0350   HGB 8.4 (L) 06/03/2023 0350   HCT 24.1 (L) 06/03/2023 0350   PLT 86 (L) 06/03/2023 0350    BMET    Component Value Date/Time   NA 127 (L) 06/03/2023 0350   K 3.5 06/03/2023 0350   CL 91 (L) 06/03/2023 0350   CO2 22 06/03/2023 0350   GLUCOSE 218 (H) 06/03/2023 0350   BUN 40 (H) 06/03/2023 0350   CREATININE 2.83 (H) 06/03/2023 0350   CALCIUM 8.0 (L) 06/03/2023 0350   CALCIUM 7.6 (L) 08/26/2021 0050   GFRNONAA 21 (L) 06/03/2023 0350   GFRAA 26 (L) 05/07/2019 0234    COAG Lab Results  Component Value Date   INR 1.3 (H) 06/01/2023   INR 1.2 05/19/2023   INR 1.0 06/30/2022   No results found for: "PTT"  Antibiotics Anti-infectives (From admission, onward)    Start     Dose/Rate Route Frequency Ordered Stop   06/04/23 1200  vancomycin (VANCOREADY) IVPB 750 mg/150 mL        750 mg 150 mL/hr over 60 Minutes Intravenous Every M-W-F (Hemodialysis) 06/01/23 2020      06/02/23 1800  vancomycin (VANCOREADY) IVPB 750 mg/150 mL  Status:  Discontinued        750 mg 150 mL/hr over 60 Minutes Intravenous  Once 06/02/23 1430 06/02/23 1431   06/02/23 1800  vancomycin (VANCOREADY) IVPB 750 mg/150 mL        750 mg 150 mL/hr over 60 Minutes Intravenous  Once 06/02/23 1431 06/02/23 2137   06/01/23 2200  ceFEPIme (MAXIPIME) 1 g in sodium chloride 0.9 % 100 mL IVPB        1 g 200 mL/hr over 30 Minutes Intravenous Every 24 hours 06/01/23 2020     06/01/23 2030  vancomycin (VANCOREADY) IVPB 1500 mg/300 mL        1,500 mg 150 mL/hr over 120 Minutes Intravenous  Once 06/01/23 2020 06/02/23 0334   06/01/23 1830  cefTRIAXone (ROCEPHIN) 2 g in sodium chloride 0.9 % 100 mL IVPB  Status:  Discontinued        2 g 200 mL/hr over 30 Minutes Intravenous Every 24 hours 06/01/23 1822 06/01/23 2009   06/01/23 1830  azithromycin (ZITHROMAX) 500 mg in sodium chloride 0.9 % 250 mL IVPB  Status:  Discontinued        500 mg  250 mL/hr over 60 Minutes Intravenous Every 24 hours 06/01/23 1822 06/01/23 2009        V. Charlena Cross, M.D., [facility] [place] Office: [phone] Pager:  [phone]

## 2023-06-03 NOTE — Progress Notes (Signed)
NAME:  Robert Lucas, MRN:  [id number], DOB:  1940/12/30, LOS: 2 ADMISSION DATE:  06/01/2023 History of Present Illness:  Case of an 82 year old male patient with a past medical history of CAD, hypertension, hyperlipidemia heart failure with reduced EF less than 20%, newly diagnosed A-fib status post cardioversion on amnio and Eliquis, OSA, type 2 diabetes mellitus and end-stage renal disease on HD MWF who presented to [facility] on 06/01/2023 from his SNF with altered mental status fevers and hypotension.   He was recently admitted to [facility] from 11/15 to 11/27 with new onset A-fib RVR and underwent TEE with cardioversion.  He was started on Eliquis amiodarone and midodrine.   He underwent dialysis on 11/29 and afterwards family noted he was confused altered nausea vomiting hypotensive and hypoxic requiring 4 L nasal cannula.  He was therefore brought into the ED and admitted to the ICU for pressor support.   He received a total of 1.5 L.  Labs without leukocytosis. LA 2.0  Chest x-ray with cardiac enlargement and mild perihilar infiltration progressing.  Blood cultures were drawn and he was started on broad-spectrum antibiotics with cefepime and vancomycin.  MRSA swab is pending.  Pertinent  Medical History  As above.   Significant Hospital Events: Including procedures, antibiotic start and stop dates in addition to other pertinent events   11/30 overnight: Started on broad spectrum antibiotics Vancomycin, Azithromycin and Ceftriaxone. Which are now transitioned to Cefepime and Vancomycin. Blood cultures, UA, MRSA swab, and sputum (if able to obtain) are pending.  Levophed infusion to achieve MAP > 65, fluid bolus given, stress dose steroids. Central and arterial lines placed. Bipap initiated.  11/30 AM Off pressors, awake and alert. Duplex LUE w/ extensive edema but no dvt. Blood cultures with GPC (Staph species 2/4). Repeat cultures placed. HD  2.5hrs 2400cc removed.  Interim History / Subjective:  No major events overnight. Patient awake and alert. Respiratory status is stable. Off pressors > 24hrs.   Objective   Blood pressure (!) 146/63, pulse 72, temperature 98 F (36.7 C), temperature source Oral, resp. rate 17, height 5\' 2"  (1.575 m), weight 74 kg, SpO2 92%.    FiO2 (%):  [35 %] 35 %   Intake/Output Summary (Last 24 hours) at 06/03/2023 0810 Last data filed at 06/03/2023 0700 Gross per 24 hour  Intake 481.25 ml  Output 3210 ml  Net -2728.75 ml   Filed Weights   06/02/23 1800 06/02/23 2207 06/03/23 0411  Weight: 75.3 kg 73.9 kg 74 kg   Examination: GEN: NAD  HEENT: Supple neck, reactive pupils  CVS: Normal S1, Normal S2, RRR GI: soft, non tender, +BS  Extremities: +2LE left upper extremity is significantly more swollen.    Labs and imaging were reviewed   Resolved Hospital Problem list   Shock  Assessment & Plan:  ase of an 82 year old male patient with an extensive past medical history of ESRD on HD, HFrEF < 20%, A.fib on eliquis presenting with AMS. Found to be in shock requring pressors. Likely septic from LUE cellulitis.    #Shock - Likely distributive in the setting of sepsis with LUE cellulitis possible graft involvement and bacteremia pending blood cultures. CXR with increased infiltrates and raises possibility of pneumonia.  #Metabolic encephalopathy secondary to the above.  #ESRD on HD MWF via LUE AV fistula (s/p  Left arm brachiocephalic fistula ligation, Brachial artery to basilic vein AV graft creation by Dr. Karin Lieu on 05/02/22.  He was seen in our office on 05/16/22. ) #Acute hypoxic hypercapnic respiratory failure secondary to the above requiring bipap support.  #A.fib on eliquis  #HFrEF due to ICM/CAD w/ LVEF < 20% #Chronic hyponatremia  #Chronic anemia    CVS: NE to maintain MAPs > 65. On stress dose steroids and home Midodrine 10mg  PO TID. Switch eliquis to heparin drip. Appreciate Vascular  Surgery. Possible fistulogram.  Pulmonary: C/w bipap support  ID: Continue with Vanc and Cefepime for now until micro data.  GI: Cardiac diet. Lax PRN ensure bower movements.  Endo: POC: 140-180. ISS Last POC 206 will adjust accordingly  Renal: Appreciate renal recs. HD dose meds.  Heme: Hgb > 7 and plt > 10. Heparin drip A.fib protocol.     Best Practice (right click and "Reselect all SmartList Selections" daily)   Diet/type: Regular consistency (see orders) DVT prophylaxis: SCDs Start: 06/02/23 0116 Place and maintain sequential compression device Start: 06/01/23 2313   Pressure ulcer(s): not present on admission  GI prophylaxis: PPI Lines: Central line will dc today Foley:  removal ordered  Code Status:  DNR  Last date of multidisciplinary goals of care discussion [06/03/2023]  I spent 50 minutes caring for this patient today, including preparing to see the patient, obtaining a medical history , reviewing a separately obtained history, performing a medically appropriate examination and/or evaluation, referring and communicating with other health care professionals (not separately reported), documenting clinical information in the electronic health record, and independently interpreting results (not separately reported/billed) and communicating results to the patient/family/caregiver  Janann Colonel, MD [facility] 06/03/2023 8:20 AM

## 2023-06-03 NOTE — Consult Note (Signed)
PHARMACY - ANTICOAGULATION CONSULT NOTE  Pharmacy Consult for heparin  Indication: atrial fibrillation  Allergies  Allergen Reactions   Zestril [Lisinopril] Cough   Hydrocodone Nausea Only   Januvia [Sitagliptin] Other (See Comments)    Unknown   Levitra [Vardenafil] Other (See Comments)    flushing   Prednisone Other (See Comments)    Elevates BGL; patient prefers to not take this   Victoza [Liraglutide] Other (See Comments)    GI upset   Penicillins Rash and Other (See Comments)    Rash around ankles Has patient had a PCN reaction causing immediate rash, facial/tongue/throat swelling, SOB or lightheadedness with hypotension: Yes Has patient had a PCN reaction causing severe rash involving mucus membranes or skin necrosis: Unk Has patient had a PCN reaction that required hospitalization: No Has patient had a PCN reaction occurring within the last 10 years: No If all of the above answers are "NO", then may proceed with Cephalosporin use.     Patient Measurements: Height: 5\' 2"  (157.5 cm) Weight: 74 kg (163 lb 2.3 oz) IBW/kg (Calculated) : 54.6 Heparin Dosing Weight: 70.4 kg  Vital Signs: Temp: 97.5 F (36.4 C) (12/01 1600) Temp Source: Axillary (12/01 1600) BP: 110/53 (12/01 1800) Pulse Rate: 85 (12/01 1845)  Labs: Recent Labs    06/01/23 1819 06/01/23 2357 06/02/23 0213 06/02/23 1044 06/02/23 1400 06/02/23 2150 06/03/23 0350 06/03/23 0906  HGB 10.4*  --  9.7*  --   --   --  8.4*  --   HCT 30.2*  --  27.9*  --   --   --  24.1*  --   PLT 97*  --  100*  --   --   --  86*  --   APTT 49*  --   --  66*  --  62*  --  57*  LABPROT 16.5*  --   --   --   --   --   --   --   INR 1.3*  --   --   --   --   --   --   --   HEPARINUNFRC  --   --   --  >1.10*  --   --  >1.10*  --   CREATININE 3.60*   < > 3.81*  --  4.31* 2.27* 2.83*  --    < > = values in this interval not displayed.    Estimated Creatinine Clearance: 17.1 mL/min (A) (by C-G formula based on SCr of 2.83  mg/dL (H)).   Medical History: Past Medical History:  Diagnosis Date   Arthritis    CHF (congestive heart failure) (HCC)    HFrEF   CKD (chronic kidney disease)    sees [place] in [place]   Coronary artery disease    Diabetes (HCC)    type 2   sees endo at [place] in [place]   ESRD (end stage renal disease) on dialysis [facility])    M,W,F   Hypercholesteremia    Hypertension    Myocardial infarction (HCC) 1990   Neuromuscular disorder (HCC)    Pneumonia    PONV (postoperative nausea and vomiting)     PTA Medications:  - Apixaban 2.5 mg BID for new Afib diagnosis, last dose was 11/29 at 0759   Assessment: Patient is an 82 y.o. male who presented to the ED from SNF on 11/29 with AMS and fever. PMH includes CAD, HTN, OSA, Afib, HFrEF with EF < 20%, T2DM,  thrombocytopenia, and ESRD  on HD MWF. Most recent hospitalization was 11/15-11/27, patient diagnosed with Afib and started on amiodarone and Eliquis. Patient admitted  to ICU 11/29 for severe sepsis and hypotension with SBP in the 50s. NPO until mental status improves. Hgb 9.7, Plt 100. Baseline HL > 1.10 due to Eliquis PTA, aPTT elevated at 66 s.  Date/Time aPTT/HL Rate  Comment 11/30 2150 62  1000 units/hr  Subtherapeutic 12/01 0906 57  1150 units/hr Subtherapeutic 12/01 2017 >200  1150 units/hr SUPRAtherapeutic  Per nurse: It was a line draw. Heparin was stopped for over 2 mins, line was flushed and 2 waste tubes drawn.   Goal of Therapy:  Heparin level 0.3-0.7 units/ml aPTT 90-102 s Monitor platelets by anticoagulation protocol: Yes   Plan:  -  aPTT level is SUPRAtherapeutic - Hold heparin infusion x 1 hour - Decrease IV heparin infusion to a rate of 950 units/hr  - 8 hour aPTT after re-starting heparin drip, daily heparin level and CBC while on heparin  - Titrate heparin drip using aPTT until level correlates with HL, then transition to HL dosing   Merryl Hacker, PharmD Clinical Pharmacist 06/03/2023,7:56 PM

## 2023-06-03 NOTE — Consult Note (Signed)
PHARMACY CONSULT NOTE - ELECTROLYTES  Pharmacy Consult for Electrolyte Monitoring and Replacement   Recent Labs: Potassium (mmol/L)  Date Value  06/03/2023 3.5   Magnesium (mg/dL)  Date Value  96/29/5284 2.0   Calcium (mg/dL)  Date Value  13/24/4010 8.0 (L)   Calcium, Total (PTH) (mg/dL)  Date Value  27/25/3664 7.6 (L)   Albumin (g/dL)  Date Value  40/34/7425 2.5 (L)   Phosphorus (mg/dL)  Date Value  95/63/8756 3.4   Sodium (mmol/L)  Date Value  06/03/2023 127 (L)   Height: 5\' 2"  (157.5 cm) Weight: 74 kg (163 lb 2.3 oz) IBW/kg (Calculated) : 54.6 Estimated Creatinine Clearance: 17.1 mL/min (A) (by C-G formula based on SCr of 2.83 mg/dL (H)).  Assessment  Robert Lucas is a 82 y.o. male presenting with sepsis. PMH significant for CAD, MI, HTN, HLD, systolic / diastolic heart failure EF < 20%, new diagnosis Afib on Eliquis, OSA, former smoker, diabetes,  ESRD on HD MWF, anemia, thrombocytopenia, chronic hyponatremia,  and gout . Pharmacy has been consulted to monitor and replace electrolytes.  Diet: NPO, pending speech evaluation MIVF: N/A Pertinent medications: N/A  Goal of Therapy: Electrolytes within normal limits  Plan:  Na 121, NaCl 1 g BIDM (not being administered) Follow-up electrolytes with AM labs  Thank you for allowing pharmacy to be a part of this patient's care.  Tressie Ellis 06/03/2023 11:22 AM

## 2023-06-03 NOTE — Progress Notes (Signed)
PT Cancellation Note  Patient Details Name: Robert Lucas MRN: [id number] DOB: [birth year]   Cancelled Treatment:    Reason Eval/Treat Not Completed: Fatigue/lethargy limiting ability to participate (Chart reviewed, RN consulted. Pt remains globally weak and lethargic, struggled with self feeding earlier. RN reports LUEedema conitnues to progress AEB circumferencial assessment twice today.) By description of pt struggling to hold up his fork/spoon, do not think OOB assessment would be tolerated well at this time- hopefully with continued medical optimization, energy levels will be improved tomorrow. Per RN, DVT study negative LUE, however, vascular surgery has not consulted on pt yet. Will continue to follow and evaluate as appropriate at later date/time.   1:31 PM, 06/03/23 Robert Lucas, PT, DPT Physical Therapist - [facility]  [phone] (ASCOM)     Lova Urbieta C 06/03/2023, 1:29 PM

## 2023-06-03 NOTE — Progress Notes (Deleted)
Discussed BP parameters with Dr Larinda Buttery to keep Systolic above 80 and MAP greater than 65. Levophed order modified to reflect parameters.

## 2023-06-03 NOTE — Progress Notes (Signed)
RUE assessed.  Bruised from shoulder distally with multiple stages of bruising present.  Assessed visually and with [place].  No suitable veins present, all being too small or noncompressible. Recommended to leave CVC in.  Pt stated "my veins are tiny." Noted veins to be 100% catheter occupancy for a 24 G cath.  RN's aware.

## 2023-06-03 NOTE — Plan of Care (Signed)
  Problem: Respiratory: Goal: Ability to maintain adequate ventilation will improve Outcome: Progressing   Problem: Coping: Goal: Ability to adjust to condition or change in health will improve Outcome: Progressing   Problem: Clinical Measurements: Goal: Ability to maintain a body temperature in the normal range will improve Outcome: Progressing   Problem: Education: Goal: Knowledge of General Education information will improve Description: Including pain rating scale, medication(s)/side effects and non-pharmacologic comfort measures Outcome: Progressing

## 2023-06-03 NOTE — Progress Notes (Signed)
Informed consent obtained. Patient verbally agrees to shuntogram with possible intervention. Patient's wife and daughter agreeable. Consent signed by patient's daughter due to patient's weakness. This nurse and Durwin Reges, RN present and witnessed verbal and written consent. Consent placed in Room 10 bedside binder for physician review.

## 2023-06-03 DEATH — deceased

## 2023-06-04 ENCOUNTER — Encounter: Admission: EM | Disposition: E | Payer: Self-pay | Source: Skilled Nursing Facility | Attending: Pulmonary Disease

## 2023-06-04 ENCOUNTER — Encounter: Payer: Self-pay | Admitting: Anesthesiology

## 2023-06-04 DIAGNOSIS — N186 End stage renal disease: Secondary | ICD-10-CM | POA: Diagnosis not present

## 2023-06-04 DIAGNOSIS — Z992 Dependence on renal dialysis: Secondary | ICD-10-CM

## 2023-06-04 DIAGNOSIS — T82858A Stenosis of vascular prosthetic devices, implants and grafts, initial encounter: Secondary | ICD-10-CM | POA: Diagnosis not present

## 2023-06-04 DIAGNOSIS — T82898A Other specified complication of vascular prosthetic devices, implants and grafts, initial encounter: Secondary | ICD-10-CM | POA: Diagnosis not present

## 2023-06-04 DIAGNOSIS — J189 Pneumonia, unspecified organism: Secondary | ICD-10-CM | POA: Diagnosis not present

## 2023-06-04 HISTORY — PX: A/V FISTULAGRAM: CATH118298

## 2023-06-04 LAB — GLUCOSE, CAPILLARY
Glucose-Capillary: 107 mg/dL — ABNORMAL HIGH (ref 70–99)
Glucose-Capillary: 108 mg/dL — ABNORMAL HIGH (ref 70–99)
Glucose-Capillary: 125 mg/dL — ABNORMAL HIGH (ref 70–99)
Glucose-Capillary: 128 mg/dL — ABNORMAL HIGH (ref 70–99)
Glucose-Capillary: 153 mg/dL — ABNORMAL HIGH (ref 70–99)
Glucose-Capillary: 166 mg/dL — ABNORMAL HIGH (ref 70–99)
Glucose-Capillary: 180 mg/dL — ABNORMAL HIGH (ref 70–99)

## 2023-06-04 LAB — COMPREHENSIVE METABOLIC PANEL
ALT: 31 U/L (ref 0–44)
AST: 47 U/L — ABNORMAL HIGH (ref 15–41)
Albumin: 2.4 g/dL — ABNORMAL LOW (ref 3.5–5.0)
Alkaline Phosphatase: 150 U/L — ABNORMAL HIGH (ref 38–126)
Anion gap: 13 (ref 5–15)
BUN: 57 mg/dL — ABNORMAL HIGH (ref 8–23)
CO2: 21 mmol/L — ABNORMAL LOW (ref 22–32)
Calcium: 8.7 mg/dL — ABNORMAL LOW (ref 8.9–10.3)
Chloride: 92 mmol/L — ABNORMAL LOW (ref 98–111)
Creatinine, Ser: 3.82 mg/dL — ABNORMAL HIGH (ref 0.61–1.24)
GFR, Estimated: 15 mL/min — ABNORMAL LOW (ref >60)
Glucose, Bld: 133 mg/dL — ABNORMAL HIGH (ref 70–99)
Potassium: 4.4 mmol/L (ref 3.5–5.1)
Sodium: 126 mmol/L — ABNORMAL LOW (ref 135–145)
Total Bilirubin: 1.1 mg/dL (ref <1.2)
Total Protein: 5.4 g/dL — ABNORMAL LOW (ref 6.5–8.1)

## 2023-06-04 LAB — CBC
HCT: 25.7 % — ABNORMAL LOW (ref 39.0–52.0)
Hemoglobin: 9 g/dL — ABNORMAL LOW (ref 13.0–17.0)
MCH: 33.8 pg (ref 26.0–34.0)
MCHC: 35 g/dL (ref 30.0–36.0)
MCV: 96.6 fL (ref 80.0–100.0)
Platelets: 79 10*3/uL — ABNORMAL LOW (ref 150–400)
RBC: 2.66 MIL/uL — ABNORMAL LOW (ref 4.22–5.81)
RDW: 17.4 % — ABNORMAL HIGH (ref 11.5–15.5)
WBC: 9.2 10*3/uL (ref 4.0–10.5)
nRBC: 0 % (ref 0.0–0.2)

## 2023-06-04 LAB — APTT
aPTT: >200 s (ref 24–36)
aPTT: 177 s (ref 24–36)
aPTT: 158 s — ABNORMAL HIGH (ref 24–36)

## 2023-06-04 LAB — MAGNESIUM: Magnesium: 2.1 mg/dL (ref 1.7–2.4)

## 2023-06-04 LAB — PROCALCITONIN: Procalcitonin: 20.66 ng/mL

## 2023-06-04 LAB — PHOSPHORUS: Phosphorus: 4.8 mg/dL — ABNORMAL HIGH (ref 2.5–4.6)

## 2023-06-04 SURGERY — A/V FISTULAGRAM
Anesthesia: Moderate Sedation | Laterality: Left

## 2023-06-04 MED ORDER — MIDAZOLAM HCL 2 MG/ML PO SYRP
8.0000 mg | ORAL_SOLUTION | Freq: Once | ORAL | Status: DC | PRN
  Filled 2023-06-04: qty 5

## 2023-06-04 MED ORDER — DIPHENHYDRAMINE HCL 50 MG/ML IJ SOLN: 50.0000 mg | Freq: Once | INTRAMUSCULAR | Status: DC | PRN

## 2023-06-04 MED ORDER — HEPARIN (PORCINE) 25000 UT/250ML-% IV SOLN
600.0000 [IU]/h | INTRAVENOUS | Status: DC
  Administered 2023-06-04: 600 [IU]/h via INTRAVENOUS

## 2023-06-04 MED ORDER — CEFAZOLIN SODIUM-DEXTROSE 1-4 GM/50ML-% IV SOLN
1.0000 g | INTRAVENOUS | Status: DC
  Filled 2023-06-04: qty 50

## 2023-06-04 MED ORDER — SODIUM CHLORIDE 0.9 % IV SOLN: INTRAVENOUS | Status: DC

## 2023-06-04 MED ORDER — HEPARIN (PORCINE) IN NACL 1000-0.9 UT/500ML-% IV SOLN
INTRAVENOUS | Status: DC | PRN
  Administered 2023-06-04: 500 mL

## 2023-06-04 MED ORDER — FENTANYL CITRATE (PF) 100 MCG/2ML IJ SOLN
INTRAMUSCULAR | Status: DC | PRN
  Administered 2023-06-04: 25 ug via INTRAVENOUS

## 2023-06-04 MED ORDER — HEPARIN SODIUM (PORCINE) 1000 UNIT/ML IJ SOLN
INTRAMUSCULAR | Status: AC
  Filled 2023-06-04: qty 10

## 2023-06-04 MED ORDER — FENTANYL CITRATE PF 50 MCG/ML IJ SOSY
12.5000 ug | PREFILLED_SYRINGE | Freq: Once | INTRAMUSCULAR | Status: DC | PRN
Start: 2023-06-04 — End: 2023-06-04

## 2023-06-04 MED ORDER — MIDAZOLAM HCL 2 MG/2ML IJ SOLN
INTRAMUSCULAR | Status: AC
  Filled 2023-06-04: qty 2

## 2023-06-04 MED ORDER — IODIXANOL 320 MG/ML IV SOLN
INTRAVENOUS | Status: DC | PRN
  Administered 2023-06-04: 40 mL via INTRAVENOUS

## 2023-06-04 MED ORDER — LIDOCAINE-EPINEPHRINE (PF) 1 %-1:200000 IJ SOLN
INTRAMUSCULAR | Status: DC | PRN
  Administered 2023-06-04: 5 mL via INTRADERMAL

## 2023-06-04 MED ORDER — FAMOTIDINE 20 MG PO TABS: 40.0000 mg | ORAL_TABLET | Freq: Once | ORAL | Status: DC | PRN

## 2023-06-04 MED ORDER — HEPARIN (PORCINE) 25000 UT/250ML-% IV SOLN
750.0000 [IU]/h | INTRAVENOUS | Status: DC
  Administered 2023-06-04: 750 [IU]/h via INTRAVENOUS

## 2023-06-04 MED ORDER — FENTANYL CITRATE PF 50 MCG/ML IJ SOSY
PREFILLED_SYRINGE | INTRAMUSCULAR | Status: AC
  Filled 2023-06-04: qty 1

## 2023-06-04 MED ORDER — MIDAZOLAM HCL 2 MG/2ML IJ SOLN
INTRAMUSCULAR | Status: DC | PRN
  Administered 2023-06-04: 1 mg via INTRAVENOUS

## 2023-06-04 SURGICAL SUPPLY — 14 items
BALLN LUTONIX AV 10X60X75 (BALLOONS) ×1 IMPLANT
BALLN ULTRVRSE 7X100X75 (BALLOONS) ×1 IMPLANT
BALLOON LUTONIX AV 10X60X75 (BALLOONS) IMPLANT
BALLOON ULTRVRSE 7X100X75 (BALLOONS) IMPLANT
CANNULA 5F STIFF (CANNULA) IMPLANT
COVER PROBE ULTRASOUND 5X96 (MISCELLANEOUS) IMPLANT
DEVICE PRESTO INFLATION (MISCELLANEOUS) IMPLANT
DRAPE BRACHIAL (DRAPES) IMPLANT
PACK ANGIOGRAPHY (CUSTOM PROCEDURE TRAY) ×2 IMPLANT
SHEATH BRITE TIP 6FRX5.5 (SHEATH) IMPLANT
SHEATH BRITE TIP 7FRX5.5 (SHEATH) IMPLANT
STENT VIABAHN 8X10X120 (Permanent Stent) IMPLANT
SUT MNCRL AB 4-0 PS2 18 (SUTURE) IMPLANT
WIRE G 018X200 V18 (WIRE) IMPLANT

## 2023-06-04 NOTE — Interval H&P Note (Signed)
History and Physical Interval Note:  06/04/2023 2:06 PM  Robert Lucas  has presented today for surgery, with the diagnosis of esrd.  The various methods of treatment have been discussed with the patient and family. After consideration of risks, benefits and other options for treatment, the patient has consented to  Procedure(s): A/V Fistulagram (Left) as a surgical intervention.  The patient's history has been reviewed, patient examined, no change in status, stable for surgery.  I have reviewed the patient's chart and labs.  Questions were answered to the patient's satisfaction.     Festus Barren

## 2023-06-04 NOTE — Progress Notes (Signed)
[facility] [place], [place] 06/04/23  Subjective:   Hospital day # 3 Robert Lucas is a 82 y.o. male  hx of sCHF with EF< 20%, A fib on Eliquis, HTN, HLD, DM, CAD, gout, BPH, anemia, Bell's palsy, obesity, OSA not using CPAP, former smoker, chronic hyponatremia, admitted due to sepsis and HCAP,    Patient underwent dialysis last on Saturday. Patient underwent PTA of left subclavian vein. Patient also had stent placement within access.  Objective:  Vital signs in last 24 hours:  Temp:  [97.3 F (36.3 C)-98.4 F (36.9 C)] 97.3 F (36.3 C) (12/02 1700) Pulse Rate:  [77-95] 82 (12/02 1800) Resp:  [13-26] 13 (12/02 1800) BP: (98-136)/(47-81) 103/54 (12/02 1800) SpO2:  [85 %-99 %] 97 % (12/02 1800) FiO2 (%):  [35 %-36 %] 35 % (12/02 1735) Weight:  [76.7 kg] 76.7 kg (12/02 0500)  Weight change: 1.4 kg Filed Weights   06/02/23 2207 06/03/23 0411 06/04/23 0500  Weight: 73.9 kg 74 kg 76.7 kg    Intake/Output:    Intake/Output Summary (Last 24 hours) at 06/04/2023 1836 Last data filed at 06/04/2023 1700 Gross per 24 hour  Intake 560.93 ml  Output 0 ml  Net 560.93 ml     Physical Exam: General: Lying in the bed, no acute distress  HEENT Moist oral mucous membranes  Pulm/lungs Scattered rhonchi  CVS/Heart Tachycardic, irregular  Abdomen:  Mildly distended  Extremities: 1+ lower extremity edema  Neurologic: Alert, able to answer questions appropriately  Skin: Bruising over multiple areas  Access: Left upper extremity AV graft       Basic Metabolic Panel:  Recent Labs  Lab 05/30/23 0706 06/01/23 1819 06/01/23 2357 06/02/23 0213 06/02/23 1400 06/02/23 2150 06/03/23 0350 06/04/23 0428  NA 124* 123*   < > 121* 123* 127* 127* 126*  K 3.7 3.4*   < > 3.5 3.9 3.2* 3.5 4.4  CL 89* 87*   < > 86* 89* 92* 91* 92*  CO2 23 23   < > 21* 20* 24 22 21*  GLUCOSE 145* 186*   < > 206* 192* 206* 218* 133*  BUN 60* 51*   < > 59* 68* 32* 40* 57*  CREATININE  4.25* 3.60*   < > 3.81* 4.31* 2.27* 2.83* 3.82*  CALCIUM 8.5* 8.0*   < > 7.4* 8.1* 8.0* 8.0* 8.7*  MG 1.8  --   --  1.7  --   --  2.0 2.1  PHOS 3.9 3.3  --  4.8*  --   --  3.4 4.8*   < > = values in this interval not displayed.     CBC: Recent Labs  Lab 05/30/23 0706 06/01/23 1819 06/02/23 0213 06/03/23 0350 06/04/23 0428  WBC 4.9 3.3* 7.2 5.2 9.2  NEUTROABS  --  2.4  --   --   --   HGB 10.3* 10.4* 9.7* 8.4* 9.0*  HCT 29.5* 30.2* 27.9* 24.1* 25.7*  MCV 98.3 99.7 98.2 98.4 96.6  PLT 96* 97* 100* 86* 79*      Lab Results  Component Value Date   HEPBSAG NON REACTIVE 05/20/2023      Microbiology:  Recent Results (from the past 240 hour(s))  Culture, blood (x 2)     Status: None (Preliminary result)   Collection Time: 06/01/23  6:22 PM   Specimen: BLOOD  Result Value Ref Range Status   Specimen Description BLOOD BLOOD LEFT ARM  Final   Special Requests   Final  BOTTLES DRAWN AEROBIC AND ANAEROBIC Blood Culture adequate volume   Culture   Final    NO GROWTH 3 DAYS Performed at [facility], [address] [place], [place] [address]    Report Status PENDING  Incomplete  Culture, blood (x 2)     Status: Abnormal   Collection Time: 06/01/23  6:22 PM   Specimen: BLOOD  Result Value Ref Range Status   Specimen Description   Final    BLOOD NECK Performed at [facility], [address] [place], [place] [address]    Special Requests   Final    BOTTLES DRAWN AEROBIC AND ANAEROBIC Blood Culture adequate volume Performed at [facility], [address] [place], [place] [address]    Culture  Setup Time   Final    GRAM POSITIVE COCCI IN BOTH AEROBIC AND ANAEROBIC BOTTLES CRITICAL RESULT CALLED TO, READ BACK BY AND VERIFIED WITH: ANDREA DOBBS ON 06/02/23 AT 1126 QSD    Culture (A)  Final    STAPHYLOCOCCUS WARNERI THE SIGNIFICANCE OF ISOLATING THIS ORGANISM FROM A SINGLE SET OF BLOOD CULTURES WHEN MULTIPLE SETS ARE DRAWN IS UNCERTAIN.  PLEASE NOTIFY THE MICROBIOLOGY DEPARTMENT WITHIN ONE WEEK IF SPECIATION AND SENSITIVITIES ARE REQUIRED. Performed at [facility], [address], [place], [place] [address]    Report Status 06/04/2023 FINAL  Final  Blood Culture ID Panel (Reflexed)     Status: Abnormal   Collection Time: 06/01/23  6:22 PM  Result Value Ref Range Status   Enterococcus faecalis NOT DETECTED NOT DETECTED Final   Enterococcus Faecium NOT DETECTED NOT DETECTED Final   Listeria monocytogenes NOT DETECTED NOT DETECTED Final   Staphylococcus species DETECTED (A) NOT DETECTED Final    Comment: CRITICAL RESULT CALLED TO, READ BACK BY AND VERIFIED WITH: ANDREA DOBBS ON 06/02/23 AT 1126 QSD    Staphylococcus aureus (BCID) NOT DETECTED NOT DETECTED Final   Staphylococcus epidermidis NOT DETECTED NOT DETECTED Final   Staphylococcus lugdunensis NOT DETECTED NOT DETECTED Final   Streptococcus species NOT DETECTED NOT DETECTED Final   Streptococcus agalactiae NOT DETECTED NOT DETECTED Final   Streptococcus pneumoniae NOT DETECTED NOT DETECTED Final   Streptococcus pyogenes NOT DETECTED NOT DETECTED Final   A.calcoaceticus-baumannii NOT DETECTED NOT DETECTED Final   Bacteroides fragilis NOT DETECTED NOT DETECTED Final   Enterobacterales NOT DETECTED NOT DETECTED Final   Enterobacter cloacae complex NOT DETECTED NOT DETECTED Final   Escherichia coli NOT DETECTED NOT DETECTED Final   Klebsiella aerogenes NOT DETECTED NOT DETECTED Final   Klebsiella oxytoca NOT DETECTED NOT DETECTED Final   Klebsiella pneumoniae NOT DETECTED NOT DETECTED Final   Proteus species NOT DETECTED NOT DETECTED Final   Salmonella species NOT DETECTED NOT DETECTED Final   Serratia marcescens NOT DETECTED NOT DETECTED Final   Haemophilus influenzae NOT DETECTED NOT DETECTED Final   Neisseria meningitidis NOT DETECTED NOT DETECTED Final   Pseudomonas aeruginosa NOT DETECTED NOT DETECTED Final   Stenotrophomonas maltophilia NOT DETECTED NOT  DETECTED Final   Candida albicans NOT DETECTED NOT DETECTED Final   Candida auris NOT DETECTED NOT DETECTED Final   Candida glabrata NOT DETECTED NOT DETECTED Final   Candida krusei NOT DETECTED NOT DETECTED Final   Candida parapsilosis NOT DETECTED NOT DETECTED Final   Candida tropicalis NOT DETECTED NOT DETECTED Final   Cryptococcus neoformans/gattii NOT DETECTED NOT DETECTED Final    Comment: Performed at [facility], [address], [place], [place] [address]  Resp panel  by RT-PCR (RSV, Flu A&B, Covid) Anterior Nasal Swab     Status: None   Collection Time: 06/01/23  7:14 PM   Specimen: Anterior Nasal Swab  Result Value Ref Range Status   SARS Coronavirus 2 by RT PCR NEGATIVE NEGATIVE Final    Comment: (NOTE) SARS-CoV-2 target nucleic acids are NOT DETECTED.  The SARS-CoV-2 RNA is generally detectable in upper respiratory specimens during the acute phase of infection. The lowest concentration of SARS-CoV-2 viral copies this assay can detect is 138 copies/mL. A negative result does not preclude SARS-Cov-2 infection and should not be used as the sole basis for treatment or other patient management decisions. A negative result may occur with  improper specimen collection/handling, submission of specimen other than nasopharyngeal swab, presence of viral mutation(s) within the areas targeted by this assay, and inadequate number of viral copies(<138 copies/mL). A negative result must be combined with clinical observations, patient history, and epidemiological information. The expected result is Negative.  Fact Sheet for Patients:  BloggerCourse.com  Fact Sheet for Healthcare Providers:  SeriousBroker.it  This test is no t yet approved or cleared by the [place] FDA and  has been authorized for detection and/or diagnosis of SARS-CoV-2 by FDA under an Emergency Use Authorization (EUA). This EUA will remain  in effect  (meaning this test can be used) for the duration of the COVID-19 declaration under Section 564(b)(1) of the Act, 21 U.S.C.section 360bbb-3(b)(1), unless the authorization is terminated  or revoked sooner.       Influenza A by PCR NEGATIVE NEGATIVE Final   Influenza B by PCR NEGATIVE NEGATIVE Final    Comment: (NOTE) The Xpert Xpress SARS-CoV-2/FLU/RSV plus assay is intended as an aid in the diagnosis of influenza from Nasopharyngeal swab specimens and should not be used as a sole basis for treatment. Nasal washings and aspirates are unacceptable for Xpert Xpress SARS-CoV-2/FLU/RSV testing.  Fact Sheet for Patients: BloggerCourse.com  Fact Sheet for Healthcare Providers: SeriousBroker.it  This test is not yet approved or cleared by the [place] FDA and has been authorized for detection and/or diagnosis of SARS-CoV-2 by FDA under an Emergency Use Authorization (EUA). This EUA will remain in effect (meaning this test can be used) for the duration of the COVID-19 declaration under Section 564(b)(1) of the Act, 21 U.S.C. section 360bbb-3(b)(1), unless the authorization is terminated or revoked.     Resp Syncytial Virus by PCR NEGATIVE NEGATIVE Final    Comment: (NOTE) Fact Sheet for Patients: BloggerCourse.com  Fact Sheet for Healthcare Providers: SeriousBroker.it  This test is not yet approved or cleared by the [place] FDA and has been authorized for detection and/or diagnosis of SARS-CoV-2 by FDA under an Emergency Use Authorization (EUA). This EUA will remain in effect (meaning this test can be used) for the duration of the COVID-19 declaration under Section 564(b)(1) of the Act, 21 U.S.C. section 360bbb-3(b)(1), unless the authorization is terminated or revoked.  Performed at [facility], [address] [place], [place] [address]   MRSA Next Gen by  PCR, Nasal     Status: None   Collection Time: 06/02/23  2:11 AM   Specimen: Nasal Mucosa; Nasal Swab  Result Value Ref Range Status   MRSA by PCR Next Gen NOT DETECTED NOT DETECTED Final    Comment: (NOTE) The GeneXpert MRSA Assay (FDA approved for NASAL specimens only), is one component of a comprehensive MRSA colonization surveillance program. It is not intended to diagnose MRSA infection nor to guide or  monitor treatment for MRSA infections. Test performance is not FDA approved in patients less than 83 years old. Performed at [facility], [address] [place], [place] [address]   Culture, blood (Routine X 2) w Reflex to ID Panel     Status: None (Preliminary result)   Collection Time: 06/02/23  1:13 PM   Specimen: BLOOD  Result Value Ref Range Status   Specimen Description BLOOD BLOOD RIGHT HAND  Final   Special Requests   Final    BOTTLES DRAWN AEROBIC ONLY Blood Culture adequate volume   Culture   Final    NO GROWTH 2 DAYS Performed at [facility], [address] [place], [place] [address]    Report Status PENDING  Incomplete  Culture, blood (Routine X 2) w Reflex to ID Panel     Status: None (Preliminary result)   Collection Time: 06/02/23  2:15 PM   Specimen: BLOOD  Result Value Ref Range Status   Specimen Description BLOOD PICC LINE  Final   Special Requests   Final    BOTTLES DRAWN AEROBIC AND ANAEROBIC Blood Culture adequate volume   Culture   Final    NO GROWTH 2 DAYS Performed at [facility], [address], [place], [place] [address]    Report Status PENDING  Incomplete    Coagulation Studies: No results for input(s): "LABPROT", "INR" in the last 72 hours.   Urinalysis: No results for input(s): "COLORURINE", "LABSPEC", "PHURINE", "GLUCOSEU", "HGBUR", "BILIRUBINUR", "KETONESUR", "PROTEINUR", "UROBILINOGEN", "NITRITE", "LEUKOCYTESUR" in the last 72 hours.  Invalid input(s): "APPERANCEUR"    Imaging: PERIPHERAL  VASCULAR CATHETERIZATION  Result Date: 06/04/2023 See surgical note for result.  ECHOCARDIOGRAM COMPLETE  Result Date: 06/03/2023    ECHOCARDIOGRAM REPORT   Patient Name:   LERIN CHAPPLE Date of Exam: 06/03/2023 Medical Rec #:  [id number]       Height:       62.0 in Accession #:    [id number]      Weight:       163.1 lb Date of Birth:  06-Jul-1940      BSA:          1.753 m Patient Age:    82 years        BP:           144/59 mmHg Patient Gender: M               HR:           95 bpm. Exam Location:  [facility] Procedure: 2D Echo Indications:     Bacteremia R78.81  History:         Patient has prior history of Echocardiogram examinations.  Sonographer:     Elwin Sleight RDCS Referring Phys:  [id number] Janann Colonel Diagnosing Phys: Chilton Si MD  Sonographer Comments: Patient is obese. Image acquisition challenging due to patient body habitus and Image acquisition challenging due to respiratory motion. IMPRESSIONS  1. Left ventricular ejection fraction, by estimation, is 30 to 35%. The left ventricle has moderately decreased function. The left ventricle demonstrates global hypokinesis. There is mild concentric left ventricular hypertrophy. Left ventricular diastolic parameters are consistent with Grade II diastolic dysfunction (pseudonormalization). Elevated left ventricular end-diastolic pressure.  2. Right ventricular systolic function is normal. The right ventricular size is normal. There is moderately elevated pulmonary artery systolic pressure.  3. Left atrial size was severely dilated.  4. Right atrial size was severely dilated.  5. The mitral valve is degenerative. No  evidence of mitral valve regurgitation. No evidence of mitral stenosis.  6. Tricuspid valve regurgitation is moderate.  7. Left coronary cusp is essentially fixed. The aortic valve is tricuspid. There is mild calcification of the aortic valve. There is mild thickening of the aortic valve. Aortic valve regurgitation is not visualized. Mild  aortic valve stenosis. Aortic valve area, by VTI measures 1.54 cm. Aortic valve mean gradient measures 13.0 mmHg. Aortic valve Vmax measures 2.41 m/s.  8. The inferior vena cava is dilated in size with >50% respiratory variability, suggesting right atrial pressure of 8 mmHg. FINDINGS  Left Ventricle: Left ventricular ejection fraction, by estimation, is 30 to 35%. The left ventricle has moderately decreased function. The left ventricle demonstrates global hypokinesis. The left ventricular internal cavity size was normal in size. There is mild concentric left ventricular hypertrophy. Left ventricular diastolic parameters are consistent with Grade II diastolic dysfunction (pseudonormalization). Elevated left ventricular end-diastolic pressure. Right Ventricle: The right ventricular size is normal. No increase in right ventricular wall thickness. Right ventricular systolic function is normal. There is moderately elevated pulmonary artery systolic pressure. The tricuspid regurgitant velocity is 3.56 m/s, and with an assumed right atrial pressure of 8 mmHg, the estimated right ventricular systolic pressure is 58.7 mmHg. Left Atrium: Left atrial size was severely dilated. Right Atrium: Right atrial size was severely dilated. Pericardium: There is no evidence of pericardial effusion. Mitral Valve: The mitral valve is degenerative in appearance. Mild mitral annular calcification. No evidence of mitral valve regurgitation. No evidence of mitral valve stenosis. Tricuspid Valve: The tricuspid valve is normal in structure. Tricuspid valve regurgitation is moderate . No evidence of tricuspid stenosis. Aortic Valve: Left coronary cusp is essentially fixed. The aortic valve is tricuspid. There is mild calcification of the aortic valve. There is mild thickening of the aortic valve. Aortic valve regurgitation is not visualized. Mild aortic stenosis is present. Aortic valve mean gradient measures 13.0 mmHg. Aortic valve peak  gradient measures 23.2 mmHg. Aortic valve area, by VTI measures 1.54 cm. Pulmonic Valve: The pulmonic valve was normal in structure. Pulmonic valve regurgitation is trivial. No evidence of pulmonic stenosis. Aorta: The aortic root is normal in size and structure. Venous: The inferior vena cava is dilated in size with greater than 50% respiratory variability, suggesting right atrial pressure of 8 mmHg. IAS/Shunts: No atrial level shunt detected by color flow Doppler.  LEFT VENTRICLE PLAX 2D LVIDd:         5.10 cm      Diastology LVIDs:         4.30 cm      LV e' medial:    5.63 cm/s LV PW:         1.10 cm      LV E/e' medial:  23.6 LV IVS:        1.20 cm      LV e' lateral:   12.00 cm/s LVOT diam:     2.30 cm      LV E/e' lateral: 11.1 LV SV:         67 LV SV Index:   38 LVOT Area:     4.15 cm  LV Volumes (MOD) LV vol d, MOD A2C: 118.0 ml LV vol d, MOD A4C: 146.0 ml LV vol s, MOD A2C: 73.2 ml LV vol s, MOD A4C: 93.3 ml LV SV MOD A2C:     44.8 ml LV SV MOD A4C:     146.0 ml LV SV MOD BP:  48.9 ml RIGHT VENTRICLE RV Basal diam:  4.10 cm RV S prime:     12.30 cm/s TAPSE (M-mode): 1.9 cm LEFT ATRIUM             Index        RIGHT ATRIUM           Index LA diam:        4.80 cm 2.74 cm/m   RA Area:     24.70 cm LA Vol (A2C):   99.1 ml 56.53 ml/m  RA Volume:   95.10 ml  54.25 ml/m LA Vol (A4C):   72.0 ml 41.07 ml/m LA Biplane Vol: 82.5 ml 47.06 ml/m  AORTIC VALVE                     PULMONIC VALVE AV Area (Vmax):    1.43 cm      PV Vmax:        1.22 m/s AV Area (Vmean):   1.51 cm      PV Peak grad:   6.0 mmHg AV Area (VTI):     1.54 cm      RVOT Peak grad: 1 mmHg AV Vmax:           241.00 cm/s AV Vmean:          154.500 cm/s AV VTI:            0.435 m AV Peak Grad:      23.2 mmHg AV Mean Grad:      13.0 mmHg LVOT Vmax:         82.70 cm/s LVOT Vmean:        56.200 cm/s LVOT VTI:          0.161 m LVOT/AV VTI ratio: 0.37  AORTA Ao Root diam: 3.40 cm Ao Asc diam:  3.40 cm MITRAL VALVE                TRICUSPID  VALVE MV Area (PHT): 4.29 cm     TR Peak grad:   50.7 mmHg MV Decel Time: 177 msec     TR Vmax:        356.00 cm/s MV E velocity: 133.00 cm/s MV A velocity: 95.90 cm/s   SHUNTS MV E/A ratio:  1.39         Systemic VTI:  0.16 m                             Systemic Diam: 2.30 cm Chilton Si MD Electronically signed by Chilton Si MD Signature Date/Time: 06/03/2023/4:20:43 PM    Final      Medications:    albumin human     ceFEPime (MAXIPIME) IV Stopped (06/03/23 2235)   heparin 750 Units/hr (06/04/23 1400)   vancomycin Stopped (06/04/23 1342)    allopurinol  100 mg Oral Daily   amiodarone  200 mg Oral Daily   atorvastatin  40 mg Oral QHS   Chlorhexidine Gluconate Cloth  6 each Topical Daily   Chlorhexidine Gluconate Cloth  6 each Topical Q0600   epoetin alfa-epbx (RETACRIT) injection  4,000 Units Subcutaneous Q M,W,F-HD   insulin aspart  0-15 Units Subcutaneous Q4H   ipratropium-albuterol  3 mL Nebulization Q6H   midodrine  10 mg Oral TID WC   mouth rinse  15 mL Mouth Rinse 4 times per day   pantoprazole (PROTONIX) IV  40 mg Intravenous Daily   sodium chloride flush  10-40 mL Intracatheter Q12H   sodium chloride flush  3 mL Intravenous Q12H   sodium chloride  1 g Oral BID WC   acetaminophen, acetaminophen, dextromethorphan-guaiFENesin, heparin, ondansetron (ZOFRAN) IV, mouth rinse, polyethylene glycol, sodium chloride flush, traMADol  Assessment/ Plan:  82 y.o. male with  medical problems of  sCHF with EF< 20%, A fib on Eliquis, HTN, HLD, DM, CAD with history of CABG, gout, BPH, anemia, Bell's palsy, obesity, OSA not using CPAP, former smoker, chronic hyponatremia,   admitted on 06/01/2023 for Acute encephalopathy [G93.40] HCAP (healthcare-associated pneumonia) [J18.9] Pneumonia of right lung due to infectious organism, unspecified part of lung [J18.9]   Outpatient dialysis--[facility], Alanson Puls, MD Mircera 50 mcg, Venofer Target weight 68 kg    # ESRD with volume overload. Patient undergo hemodialysis treatment later today.   # Hyponatremia Due to fluid overload.  Dialysis should help to correct low serum sodium today.   #Anemia in CKD Receives Mircera and IV Venofer outpatient. Hemoglobin up to 9.0.  Continue low-dose Epogen.   # Thrombocytopenia Known at least since April 2023.  Platelets range between 59-140.  #Left upper extremity cellulitis Staphylococcus bacteremia. Patient was evaluated by vascular surgeon on 11/30. Ultrasound of the graft did not show any obvious fluid collection. No indication for removal of AV graft.    LOS: 3 Robert Ehresman 12/2/20246:36 PM  [facility] [address] [place], [place] [phone]  Note: This note was prepared with Dragon dictation. Any transcription errors are unintentional

## 2023-06-04 NOTE — Assessment & Plan Note (Signed)
Currently on IV heparin HR's controlled Telemetry

## 2023-06-04 NOTE — Evaluation (Addendum)
Occupational Therapy Evaluation Patient Details Name: Robert Lucas MRN: [id number] DOB: 1940-09-09 Today's Date: 06/04/2023   History of Present Illness Pt is an 82 year old male presenting to [facility] from SNF with AMS and hypotension, admitted with septic shock,likely from LUE cellulitis; metabolic encephalopathy     PMH significant for CAD, hypertension, hyperlipidemia heart failure with reduced EF less than 20%, newly diagnosed A-fib status post cardioversion on amnio and Eliquis, OSA, type 2 diabetes mellitus and end-stage renal disease on HD MWF   Clinical Impression   Chart reviewed, pt greeted in bed, alert, oriented to self, and to hospital; Increased time for processing required throughout. Co tx completed with PT to maximize function on this date. PTA pt was from rehab, however per chart prior to admission in November 2024 pt was MOD I in ADL/assist PRN for ADL, amb with SPC/RW, scooter for longer distances. Pt lives at ILF. Pt presents with deficits in strength, cognition, activity tolerance, balance affecting safe and optimal ADL completion. MAX A +2 required for bed mobility with MAX A required for sustained sitting balance with brief periods requiring slightly less. MIN-MOD A required for grooming tasks at bed level, MAX A for LB dressing. Pt will benefit form acute OT to address deficits and to facilitate optimal ADL completion. Pt is left in bed with LUE elevated, nurse in room, all needs met. OT will follow acutely.        If plan is discharge home, recommend the following: A lot of help with walking and/or transfers;A lot of help with bathing/dressing/bathroom;Assistance with cooking/housework;Assist for transportation;Help with stairs or ramp for entrance;Direct supervision/assist for financial management;Direct supervision/assist for medications management;Assistance with feeding    Functional Status Assessment  Patient has had a recent decline in their functional status and  demonstrates the ability to make significant improvements in function in a reasonable and predictable amount of time.  Equipment Recommendations  Other (comment) (defer to next venue of care)    Recommendations for Other Services       Precautions / Restrictions Precautions Precautions: Fall Restrictions Weight Bearing Restrictions: No      Mobility Bed Mobility Overal bed mobility: Needs Assistance Bed Mobility: Sit to Supine, Supine to Sit     Supine to sit: Max assist, +2 for physical assistance, +2 for safety/equipment, HOB elevated, Used rails Sit to supine: Max assist, +2 for physical assistance, +2 for safety/equipment, HOB elevated, Used rails        Transfers                   General transfer comment: unable to safely progress due to poor sitting balance and decreased activity tolerance      Balance Overall balance assessment: Needs assistance Sitting-balance support: Feet unsupported, Bilateral upper extremity supported Sitting balance-Leahy Scale: Poor Sitting balance - Comments: MAX A with brief periods of decreased assistance required                                   ADL either performed or assessed with clinical judgement   ADL Overall ADL's : Needs assistance/impaired     Grooming: Minimal assistance;Moderate assistance;Sitting;Bed level;Wash/dry face               Lower Body Dressing: Maximal assistance;Bed level Lower Body Dressing Details (indicate cue type and reason): socks at bed level     Toileting- Clothing Manipulation and Hygiene: Maximal assistance  Toileting - Clothing Manipulation Details (indicate cue type and reason): anticipate             Vision Baseline Vision/History: 1 Wears glasses Patient Visual Report: No change from baseline       Perception         Praxis         Pertinent Vitals/Pain Pain Assessment Pain Assessment: CPOT Breathing: normal Negative Vocalization: occasional  moan/groan, low speech, negative/disapproving quality Facial Expression: sad, frightened, frown Body Language: relaxed Consolability: distracted or reassured by voice/touch PAINAD Score: 3 Pain Location: generalized Pain Descriptors / Indicators: Discomfort, Grimacing Pain Intervention(s): Limited activity within patient's tolerance, Monitored during session, Repositioned     Extremity/Trunk Assessment Upper Extremity Assessment Upper Extremity Assessment: Generalized weakness;Right hand dominant;LUE deficits/detail;RUE deficits/detail RUE Deficits / Details: AROM: shoulder flexion approx 1/2 full AROM, elbow/wrist/hand approx 3/4 full AROM; grossly weak LUE Deficits / Details: LUE weeping edema LUE: Unable to fully assess due to immobilization   Lower Extremity Assessment Lower Extremity Assessment: Generalized weakness       Communication Communication Communication: Difficulty following commands/understanding Following commands: Follows one step commands with increased time Cueing Techniques: Verbal cues;Tactile cues;Visual cues   Cognition Arousal: Alert Behavior During Therapy: WFL for tasks assessed/performed Overall Cognitive Status: No family/caregiver present to determine baseline cognitive functioning Area of Impairment: Orientation, Attention, Memory, Safety/judgement, Following commands, Awareness                 Orientation Level: Disoriented to, Situation, Time (knows he is at a hospital, did not name hospital) Current Attention Level: Sustained Memory: Decreased short-term memory Following Commands: Follows one step commands with increased time Safety/Judgement: Decreased awareness of deficits Awareness: Emergent Problem Solving: Slow processing, Decreased initiation, Requires verbal cues, Requires tactile cues       General Comments  BP 142/63, spo2 >90% on 4L via [place] sitting on edge of bed; pt endorses dizziness with positional changes; breakdown on  posterior L calf addressed by nurse in room    Exercises Other Exercises Other Exercises: edu re: role of OT, role of rehab, discharge recommendations   Shoulder Instructions      Home Living Family/patient expects to be discharged to:: Skilled nursing facility                                 Additional Comments: per chart pt lives in ILF with wife, was MOD I in ADL, amb with cane/RW/use of scooter prior to admission to [facility] in November 2024      Prior Functioning/Environment Prior Level of Function : Driving;History of Falls (last six months);Needs assist             Mobility Comments: SPC for household distances, RW/scooter for community distances per chart prior to admission in November 2024 ADLs Comments: assist for all ADLs at this time, prior to hospitaliation in November, pt was MOD I for ADLs, assist for IADLs as needed; used scooter to take trash out/check mail; wife drives        OT Problem List: Decreased strength;Decreased activity tolerance;Impaired balance (sitting and/or standing);Cardiopulmonary status limiting activity;Decreased cognition;Decreased knowledge of use of DME or AE;Impaired UE functional use      OT Treatment/Interventions: Self-care/ADL training;Therapeutic exercise;Energy conservation;DME and/or AE instruction;Therapeutic activities;Patient/family education;Balance training    OT Goals(Current goals can be found in the care plan section) Acute Rehab OT Goals Patient Stated Goal: go to rehab OT Goal  Formulation: With patient Time For Goal Achievement: 06/18/23 Potential to Achieve Goals: Good  OT Frequency: Min 1X/week    Co-evaluation   Reason for Co-Treatment: Complexity of the patient's impairments (multi-system involvement);To address functional/ADL transfers PT goals addressed during session: Mobility/safety with mobility        AM-PAC OT "6 Clicks" Daily Activity     Outcome Measure Help from another person  eating meals?: A Lot Help from another person taking care of personal grooming?: A Lot Help from another person toileting, which includes using toliet, bedpan, or urinal?: Total Help from another person bathing (including washing, rinsing, drying)?: Total Help from another person to put on and taking off regular upper body clothing?: A Lot Help from another person to put on and taking off regular lower body clothing?: A Lot 6 Click Score: 10   End of Session Equipment Utilized During Treatment: Oxygen Nurse Communication: Mobility status  Activity Tolerance: Patient limited by fatigue Patient left: in bed;with call bell/phone within reach;with bed alarm set;with nursing/sitter in room  OT Visit Diagnosis: Unsteadiness on feet (R26.81);Other abnormalities of gait and mobility (R26.89);Muscle weakness (generalized) (M62.81)                Time: 9147-8295 OT Time Calculation (min): 20 min Charges:  OT General Charges $OT Visit: 1 Visit OT Evaluation $OT Eval Moderate Complexity: 1 Mod  Oleta Mouse, OTD OTR/L  06/04/23, 9:27 AM

## 2023-06-04 NOTE — Assessment & Plan Note (Signed)
Resolved. Ongoing thrombocytopenia (plt 79k) Monitor on heparin Hbg 9.0 stalbe

## 2023-06-04 NOTE — Assessment & Plan Note (Signed)
On Lipitor 

## 2023-06-04 NOTE — Progress Notes (Signed)
Progress Note   Patient: Robert Lucas JWJ:[id number] DOB: 09-Jul-1940 DOA: 06/01/2023     3 DOS: the patient was seen and examined on 06/04/2023   Brief hospital course: HPI from admission 06/01/23: "82 year old male patient with a past medical history of CAD, hypertension, hyperlipidemia heart failure with reduced EF less than 20%, newly diagnosed A-fib status post cardioversion on amnio and Eliquis, OSA, type 2 diabetes mellitus and end-stage renal disease on HD MWF who presented to [facility] on 06/01/2023 from his SNF with altered mental status fevers and hypotension.   He was recently admitted to [facility] from 11/15 to 11/27 with new onset A-fib RVR and underwent TEE with cardioversion.  He was started on Eliquis amiodarone and midodrine.   He underwent dialysis on 11/29 and afterwards family noted he was confused altered nausea vomiting hypotensive and hypoxic requiring 4 L nasal cannula.  He was therefore brought into the ED and admitted to the ICU for pressor support.   He received a total of 1.5 L.  Labs without leukocytosis. LA 2.0  Chest x-ray with cardiac enlargement and mild perihilar infiltration progressing.  Blood cultures were drawn and he was started on broad-spectrum antibiotics with cefepime and vancomycin. "   See ICU H&P and progress notes for detailed course and management.  In summary, patient was admitted to ICU requiring vasopressors for septic shock from pneumonia versus possible AV graft infection.    12/2 -- Patient off pressors and TRH assumed care. Pt remains in stepdown for close monitoring due to persistent encephalopathy.  He remains on 4-6 L/min oxygen and intermittent bipap.   Assessment and Plan: * HCAP (healthcare-associated pneumonia) Continue Cefepime, Vancomycin Mucinex Remains on oxygen Scheduled Duonebs Q6H  Acute respiratory failure with hypoxia (HCC) Due to pneumonia - see HCAP Remains on 4-6  L/min oxygen --Wean O2 as tolerated, goal spO2 > 90% --Pulmonary hygiene - IS, flutter --BiPAP as needed  Severe sepsis with septic shock (CODE) (HCC) Shock resolved, off pressors 12/1 See HCAP Follow cultures & continue antibiotics  ESRD on dialysis [facility]) Dialysis per Nephrology Vascular surgery plans for fistulogram this afternoon - follow up findings  Acute metabolic encephalopathy Due to septic shock, hypoxia, underlying renal failure. --Mgmt of underlying issues as outlined --Delirium precautions --Neuro checks --Monitor & replace electrolytes  HFrEF (heart failure with reduced ejection fraction) (HCC) Echo 06/03/23 - EF 30-35%, grade II DD, moderately elevated PASP, mod TR, mild AS --Volume mgmt by dialysis --Monitor volume status, hemodyamics  Hypokalemia Resolved, K 4.4 Monitor & replace K PRN  Hyponatremia Chronic.  Na stable in upper 120's Monitor BMP's  Pancytopenia (HCC) Resolved. Ongoing thrombocytopenia (plt 79k) Monitor on heparin Hbg 9.0 stalbe  Type II diabetes mellitus with renal manifestations (HCC) Sliding scale novolog  Gout On allopurinol  Hyperlipidemia On Lipitor  Essential hypertension Currently soft BP's Continue midodrine  Atrial fibrillation, chronic (HCC) Currently on IV heparin HR's controlled Telemetry  Obesity (BMI 30-39.9) Body mass index is 30.93 kg/m. Complicates overall care and prognosis.  Recommend lifestyle modifications including physical activity and diet for weight loss and overall long-term health.        Subjective: Pt seen in stepdown this AM. He is somnolent but does briefly awaken to voice and responds to questions.  He denies feeling short of breath.  He has coarse breath sounds however.  Denies fever/chills.  RN reports patient max assist x 2 to attempt mobility with PT.    Physical Exam:  Vitals:   06/04/23 1725 06/04/23 1730 06/04/23 1735 06/04/23 1800  BP:   (!) 105/54 (!) 103/54  Pulse: 84 84  81 82  Resp: 18 (!) 22 16 13   Temp:      TempSrc:      SpO2: (!) 87% (!) 86% 99% 97%  Weight:      Height:       General exam: sleeping but responds to voice, no acute distress HEENT: nasal cannula in place, moist mucus membranes, hearing grossly normal  Respiratory system: diffuse coarse breath sounds and wheezes, normal respiratory effort. On 4 L/min  O2 Cardiovascular system: normal S1/S2, RRR.   Gastrointestinal system: soft, NT, ND Central nervous system: somonlent, responds to voice, no gross focal neurologic deficits, normal speech Extremities: LUE edematous and erythematous, BLE also erythema but no differential warmth, LUE fistula present with clean dry intact gauze dressing  Skin: dry, intact, normal temperature Psychiatry: limited exam due to somnolence / encephalopathy   Data Reviewed:  Notable labs -  Na 126, Cl 92, bicarb 21, glucose 133, NUN 57, Cr 3.82, Ca 8.8, phos 4.8, alkphos 150, albumin 2.4, ast 47,  Procal 20.66 Hbg 9.0 Platelets 79k   Family Communication: None present, will attempt to call as time allows  Disposition: Status is: Inpatient Remains inpatient appropriate because: severity of illness as above  Planned Discharge Destination: Skilled nursing facility    Time spent: 42 minutes  Author: Pennie Banter, DO 06/04/2023 7:20 PM  For on call review [email or web address].

## 2023-06-04 NOTE — Assessment & Plan Note (Signed)
Chronic.  Na stable in upper 120's Monitor BMP's

## 2023-06-04 NOTE — Op Note (Signed)
[facility]    OPERATIVE NOTE   PROCEDURE: 1.  Left brachial artery to axillary vein arteriovenous graft cannulation under ultrasound guidance 2.  Left arm shuntogram 3.  Percutaneous transluminal angioplasty of left subclavian vein with 10 mm diameter by 6 cm length Lutonix drug-coated angioplasty balloon 4.  Viabahn stent placement in the mid graft with 8 mm diameter by 10 cm length Viabahn stent to treat pseudoaneurysms  PRE-OPERATIVE DIAGNOSIS: 1. ESRD 2. 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  This could certainly explain the significant bruising and some of the swelling in the arm.  The venous anastomosis itself was patent, as was the axillary vein.  A short segment stenosis in the subclavian vein appeared to be in the 65 to 70% range.  The left innominate vein and superior vena cava were then widely patent.  SPECIMEN(S):  None  CONTRAST: 40 cc  FLUORO TIME: 2.1 minutes  MODERATE CONSCIOUS SEDATION TIME:  Approximately 33 minutes using 1 mg of Versed and 25 mcg of Fentanyl  INDICATIONS: Robert Lucas is a 82 y.o. male who presents with malfunctioning left brachial artery to axillary vein arteriovenous graft.  The arm is quite swollen and red and he has a large amount of bruising and what appears to be a significant infiltration.  The patient is scheduled for left arm shuntogram.  The patient is aware the risks include but are not limited to: bleeding, infection, thrombosis of the cannulated access, and possible anaphylactic reaction to the contrast.  The patient is aware of the risks of  the procedure and elects to proceed forward.  DESCRIPTION: After full informed written consent was obtained, the patient was brought back to the angiography suite and placed supine upon the angiography table.  The patient was connected to monitoring equipment. Moderate conscious sedation was administered during a face to face encounter throughout the procedure with my supervision of the RN administering medicines and monitoring the patient's vital signs, pulse oximetry, telemetry and mental status throughout from the start of the procedure until the patient was taken to the recovery room The left arm was prepped and draped in the standard fashion for a percutaneous access intervention.  Under ultrasound guidance, the left brachial artery to axillary vein arteriovenous graft was cannulated with a micropuncture needle under direct ultrasound guidance were it was patent and a permanent image was performed.  The microwire was advanced into the graft and the needle was exchanged for the a microsheath.  I then upsized to a 7 Fr Sheath and imaging was performed.  Hand injections were completed to image the access including the central venous system. 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  This could certainly explain the significant bruising and some of the swelling in the arm.  The venous anastomosis itself was patent, as was the axillary vein.  A short segment stenosis in the subclavian vein appeared to be in the 65 to 70% range.  The left innominate vein and superior vena cava were then widely patent..  Based on the images, this patient will need both treatment of the central venous stenosis as well as coverage of the pseudoaneurysms with covered stents to exclude them from flow. I then gave the patient 3000 units of intravenous  heparin.  I then crossed the stenosis and the pseudoaneurysms with a V18 wire.  Based on the imaging, a 10 mm x 6 cm Lutonix drug-coated angioplasty balloon was selected for the central venous stenosis.  The balloon was centered around the stenosis in the subclavian vein and inflated to 10 ATM for 1 minute(s).  On completion imaging, a 20-25% residual stenosis was present.   I then turned my attention to the pseudoaneurysm treatment in the mid graft.  The large pseudoaneurysm as well as several smaller pseudoaneurysms were then covered with a covered stent using an 8 mm diameter by 10 cm length Viabahn stent postdilated with a 7 mm diameter balloon.  Completion imaging showed excellent flow through the stent with no residual stenosis and successful exclusion of the pseudoaneurysms.  Based on the completion imaging, no further intervention is necessary.  The wire and balloon were removed from the sheath.  A 4-0 Monocryl purse-string suture was sewn around the sheath.  The sheath was removed while tying down the suture.  A sterile bandage was applied to the puncture site.  COMPLICATIONS: None  CONDITION: Stable   Festus Barren  06/04/2023 3:12 PM    This note was created with Dragon Medical transcription system. Any errors in dictation are purely unintentional.

## 2023-06-04 NOTE — Progress Notes (Signed)
SLP Cancellation Note  Patient Details Name: Robert Lucas MRN: [id number] DOB: 1941/01/28   Cancelled treatment:       Reason Eval/Treat Not Completed: Patient at procedure or test/unavailable (chart reviewed; consulted NSG)  Pt is currently NPO for procedure this PM, per NSG. ST services will f/u w/ pt post procedure when appropriate. Recommend frequent oral care for hygiene and stimulation of swallowing.     Jerilynn Som, MS, CCC-SLP Speech Language Pathologist Rehab Services; [facility] [phone] (ascom) Robert Lucas 06/04/2023, 10:05 AM

## 2023-06-04 NOTE — Assessment & Plan Note (Signed)
Due to septic shock, hypoxia, underlying renal failure. --Mgmt of underlying issues as outlined --Delirium precautions --Neuro checks --Monitor & replace electrolytes

## 2023-06-04 NOTE — Consult Note (Signed)
PHARMACY - ANTICOAGULATION CONSULT NOTE  Pharmacy Consult for heparin  Indication: atrial fibrillation  Allergies  Allergen Reactions   Zestril [Lisinopril] Cough   Hydrocodone Nausea Only   Januvia [Sitagliptin] Other (See Comments)    Unknown   Levitra [Vardenafil] Other (See Comments)    flushing   Prednisone Other (See Comments)    Elevates BGL; patient prefers to not take this   Victoza [Liraglutide] Other (See Comments)    GI upset   Penicillins Rash and Other (See Comments)    Rash around ankles Has patient had a PCN reaction causing immediate rash, facial/tongue/throat swelling, SOB or lightheadedness with hypotension: Yes Has patient had a PCN reaction causing severe rash involving mucus membranes or skin necrosis: Unk Has patient had a PCN reaction that required hospitalization: No Has patient had a PCN reaction occurring within the last 10 years: No If all of the above answers are "NO", then may proceed with Cephalosporin use.     Patient Measurements: Height: 5\' 2"  (157.5 cm) Weight: 76.7 kg (169 lb 1.5 oz) IBW/kg (Calculated) : 54.6 Heparin Dosing Weight: 70.4 kg  Vital Signs: Temp: 97.3 F (36.3 C) (12/02 1700) Temp Source: Axillary (12/02 1700) BP: 103/56 (12/02 1900) Pulse Rate: 74 (12/02 1900)  Labs: Recent Labs     0000 06/02/23 0213 06/02/23 1044 06/02/23 1400 06/02/23 2150 06/03/23 0350 06/03/23 0906 06/04/23 0428 06/04/23 0735 06/04/23 0931 06/04/23 1828  HGB  --  9.7*  --   --   --  8.4*  --  9.0*  --   --   --   HCT  --  27.9*  --   --   --  24.1*  --  25.7*  --   --   --   PLT  --  100*  --   --   --  86*  --  79*  --   --   --   APTT   < >  --  66*  --  62*  --    < >  --  >200* 177* 158*  HEPARINUNFRC  --   --  >1.10*  --   --  >1.10*  --   --   --   --   --   CREATININE  --  3.81*  --    < > 2.27* 2.83*  --  3.82*  --   --   --    < > = values in this interval not displayed.    Estimated Creatinine Clearance: 12.9 mL/min (A)  (by C-G formula based on SCr of 3.82 mg/dL (H)).   Medical History: Past Medical History:  Diagnosis Date   Arthritis    CHF (congestive heart failure) (HCC)    HFrEF   CKD (chronic kidney disease)    sees [place] in [place]   Coronary artery disease    Diabetes (HCC)    type 2   sees endo at [place] in [place]   ESRD (end stage renal disease) on dialysis [facility])    M,W,F   Hypercholesteremia    Hypertension    Myocardial infarction (HCC) 1990   Neuromuscular disorder (HCC)    Pneumonia    PONV (postoperative nausea and vomiting)     PTA Medications:  - Apixaban 2.5 mg BID for new Afib diagnosis, last dose was 11/29 at 0759   Assessment: Patient is an 82 y.o. male who presented to the ED from SNF on 11/29 with AMS and fever. PMH includes  CAD, HTN, OSA, Afib, HFrEF with EF < 20%, T2DM,  thrombocytopenia, and ESRD on HD MWF. Most recent hospitalization was 11/15-11/27, patient diagnosed with Afib and started on amiodarone and Eliquis. Patient admitted  to ICU 11/29 for severe sepsis and hypotension with SBP in the 50s. NPO until mental status improves. Hgb 9.7, Plt 100. Baseline HL > 1.10 due to Eliquis PTA, aPTT elevated at 66 s.  Date/Time aPTT/HL Rate  Comment 11/30 2150 62  1000 units/hr  Subtherapeutic 12/01 0906 57  1150 units/hr Subtherapeutic 12/01 2017 >200  1150 units/hr SUPRAtherapeutic 12/01 0931 177  950 units/hr SUPRAtherapeutic 12/02 1828 158  750 units/hr SUPRAtherapeutic  Goal of Therapy:  Heparin level 0.3-0.7 units/ml aPTT 90-102 s Monitor platelets by anticoagulation protocol: Yes   Plan:  -  aPTT level is SUPRAtherapeutic - Hold heparin infusion x 1 hour - Decrease IV heparin infusion to a rate of 600 units/hr  - 8 hour aPTT after re-starting heparin drip, daily heparin level and CBC while on heparin  - Titrate heparin drip using aPTT until level correlates with HL, then transition to HL dosing   Thank you for involving pharmacy in this patient's  care.   Rockwell Alexandria, PharmD Clinical Pharmacist 06/04/2023 7:46 PM

## 2023-06-04 NOTE — Assessment & Plan Note (Signed)
On allopurinol 

## 2023-06-04 NOTE — Consult Note (Signed)
PHARMACY - ANTICOAGULATION CONSULT NOTE  Pharmacy Consult for heparin  Indication: atrial fibrillation  Allergies  Allergen Reactions   Zestril [Lisinopril] Cough   Hydrocodone Nausea Only   Januvia [Sitagliptin] Other (See Comments)    Unknown   Levitra [Vardenafil] Other (See Comments)    flushing   Prednisone Other (See Comments)    Elevates BGL; patient prefers to not take this   Victoza [Liraglutide] Other (See Comments)    GI upset   Penicillins Rash and Other (See Comments)    Rash around ankles Has patient had a PCN reaction causing immediate rash, facial/tongue/throat swelling, SOB or lightheadedness with hypotension: Yes Has patient had a PCN reaction causing severe rash involving mucus membranes or skin necrosis: Unk Has patient had a PCN reaction that required hospitalization: No Has patient had a PCN reaction occurring within the last 10 years: No If all of the above answers are "NO", then may proceed with Cephalosporin use.     Patient Measurements: Height: 5\' 2"  (157.5 cm) Weight: 76.7 kg (169 lb 1.5 oz) IBW/kg (Calculated) : 54.6 Heparin Dosing Weight: 70.4 kg  Vital Signs: Temp: 98.1 F (36.7 C) (12/02 0400) Temp Source: Axillary (12/02 0400) BP: 109/56 (12/02 1300) Pulse Rate: 84 (12/02 1300)  Labs: Recent Labs    06/01/23 1819 06/01/23 2357 06/02/23 0213 06/02/23 1044 06/02/23 1400 06/02/23 2150 06/03/23 0350 06/03/23 0906 06/03/23 2017 06/04/23 0428 06/04/23 0735 06/04/23 0931  HGB 10.4*  --  9.7*  --   --   --  8.4*  --   --  9.0*  --   --   HCT 30.2*  --  27.9*  --   --   --  24.1*  --   --  25.7*  --   --   PLT 97*  --  100*  --   --   --  86*  --   --  79*  --   --   APTT 49*  --   --  66*  --  62*  --    < > >200*  --  >200* 177*  LABPROT 16.5*  --   --   --   --   --   --   --   --   --   --   --   INR 1.3*  --   --   --   --   --   --   --   --   --   --   --   HEPARINUNFRC  --   --   --  >1.10*  --   --  >1.10*  --   --   --    --   --   CREATININE 3.60*   < > 3.81*  --    < > 2.27* 2.83*  --   --  3.82*  --   --    < > = values in this interval not displayed.    Estimated Creatinine Clearance: 12.9 mL/min (A) (by C-G formula based on SCr of 3.82 mg/dL (H)).   Medical History: Past Medical History:  Diagnosis Date   Arthritis    CHF (congestive heart failure) (HCC)    HFrEF   CKD (chronic kidney disease)    sees [place] in [place]   Coronary artery disease    Diabetes (HCC)    type 2   sees endo at [place] in [place]   ESRD (end stage renal disease) on dialysis (  HCC)    M,W,F   Hypercholesteremia    Hypertension    Myocardial infarction [facility]) 1990   Neuromuscular disorder (HCC)    Pneumonia    PONV (postoperative nausea and vomiting)     PTA Medications:  - Apixaban 2.5 mg BID for new Afib diagnosis, last dose was 11/29 at 0759   Assessment: Patient is an 82 y.o. male who presented to the ED from SNF on 11/29 with AMS and fever. PMH includes CAD, HTN, OSA, Afib, HFrEF with EF < 20%, T2DM,  thrombocytopenia, and ESRD on HD MWF. Most recent hospitalization was 11/15-11/27, patient diagnosed with Afib and started on amiodarone and Eliquis. Patient admitted  to ICU 11/29 for severe sepsis and hypotension with SBP in the 50s. NPO until mental status improves. Hgb 9.7, Plt 100. Baseline HL > 1.10 due to Eliquis PTA, aPTT elevated at 66 s.  Date/Time aPTT/HL Rate  Comment 11/30 2150 62  1000 units/hr  Subtherapeutic 12/01 0906 57  1150 units/hr Subtherapeutic 12/01 2017 >200  1150 units/hr SUPRAtherapeutic 12/01 0931 177  950 units/hr SUPRAtherapeutic  Per nurse: It was a line draw. Heparin was stopped for over 2 mins, line was flushed and 2 waste tubes drawn.   Goal of Therapy:  Heparin level 0.3-0.7 units/ml aPTT 90-102 s Monitor platelets by anticoagulation protocol: Yes   Plan:  -  aPTT level is SUPRAtherapeutic - Hold heparin infusion x 1 hour - Decrease IV heparin infusion to a rate of 750  units/hr  - 8 hour aPTT after re-starting heparin drip, daily heparin level and CBC while on heparin  - Titrate heparin drip using aPTT until level correlates with HL, then transition to HL dosing   Bettey Costa, PharmD Clinical Pharmacist 06/04/2023 1:08 PM

## 2023-06-04 NOTE — Evaluation (Signed)
Physical Therapy Evaluation Patient Details Name: Robert Lucas MRN: [id number] DOB: 1940-09-02 Today's Date: 06/04/2023  History of Present Illness  Patient is a 82 year old male from SNF with altered mental status, fevers, hypotension. Recent admit to hospital with new onset A-fib and RVR s/p cardioversion.   Clinical Impression  Patient is agreeable to PT evaluation. He was living with his spouse and ambulating short distance with a cane prior to recent illness. Patient complains of LUE pain with swelling noted. The patient requires significant assistance with all mobility efforts today. Max A +2 person required for bed mobility with poor sitting balance, requiring up to Max A to maintain midline. Mild dizziness reported with vitals stable throughout session on 4 L02. Recommend to continue PT to maximize independence and facilitate return to prior level of function. Rehabilitation <3 hours/day recommended after this hospital stay.       If plan is discharge home, recommend the following: Two people to help with walking and/or transfers;Two people to help with bathing/dressing/bathroom;Assistance with cooking/housework;Assistance with feeding;Direct supervision/assist for medications management;Direct supervision/assist for financial management;Assist for transportation;Help with stairs or ramp for entrance;Supervision due to cognitive status   Can travel by private vehicle   No    Equipment Recommendations None recommended by PT  Recommendations for Other Services       Functional Status Assessment Patient has had a recent decline in their functional status and demonstrates the ability to make significant improvements in function in a reasonable and predictable amount of time.     Precautions / Restrictions Precautions Precautions: Fall Restrictions Weight Bearing Restrictions: No      Mobility  Bed Mobility Overal bed mobility: Needs Assistance Bed Mobility: Sit to Supine,  Supine to Sit     Supine to sit: Max assist, +2 for physical assistance Sit to supine: Max assist, +2 for physical assistance   General bed mobility comments: assistance for trunk and BLE support. verbal cues for technique    Transfers                   General transfer comment: unable to safely progress due to poor sitting balance and decreased activity tolerance    Ambulation/Gait                  Stairs            Wheelchair Mobility     Tilt Bed    Modified Rankin (Stroke Patients Only)       Balance Overall balance assessment: Needs assistance Sitting-balance support: Feet unsupported, Bilateral upper extremity supported Sitting balance-Leahy Scale: Poor Sitting balance - Comments: loss of balance posteriorly with limited righting reactions noted. Max A with brief periods of less assistance required.                                     Pertinent Vitals/Pain Pain Assessment Pain Assessment: PAINAD Faces Pain Scale: Hurts whole lot Pain Location: LUE with movement Pain Descriptors / Indicators: Discomfort, Grimacing, Guarding Pain Intervention(s): Limited activity within patient's tolerance, Monitored during session, Repositioned    Home Living Family/patient expects to be discharged to:: Skilled nursing facility                   Additional Comments: per chart pt lives in ILF with wife, was MOD I in ADL, amb with cane/RW/use of scooter prior to admission to [facility]  in November 2024    Prior Function Prior Level of Function : Driving;History of Falls (last six months);Needs assist             Mobility Comments: SPC for household distances, RW/scooter for community distances per chart prior to admission in November 2024 ADLs Comments: assist for all ADLs at this time, prior to hospitaliation in November, pt was MOD I for ADLs, assist for IADLs as needed; used scooter to take trash out/check mail; wife drives      Extremity/Trunk Assessment   Upper Extremity Assessment Upper Extremity Assessment: Generalized weakness;Right hand dominant;LUE deficits/detail;RUE deficits/detail RUE Deficits / Details: AROM: shoulder flexion approx 1/2 full AROM, elbow/wrist/hand approx 3/4 full AROM; grossly weak LUE Deficits / Details: LUE weeping edema LUE: Unable to fully assess due to immobilization    Lower Extremity Assessment Lower Extremity Assessment: Generalized weakness       Communication   Communication Communication: Difficulty following commands/understanding Following commands: Follows one step commands with increased time Cueing Techniques: Verbal cues;Tactile cues;Visual cues  Cognition Arousal: Alert Behavior During Therapy: WFL for tasks assessed/performed Overall Cognitive Status: Impaired/Different from baseline Area of Impairment: Orientation, Memory, Following commands, Problem solving                 Orientation Level: Disoriented to, Situation, Time   Memory: Decreased short-term memory Following Commands: Follows one step commands inconsistently     Problem Solving: Slow processing, Decreased initiation, Difficulty sequencing, Requires verbal cues, Requires tactile cues          General Comments General comments (skin integrity, edema, etc.): mild dizziness with sitting upright. blood pressure 142/63 while sitting. Sp02 94% on 4 L02.    Exercises     Assessment/Plan    PT Assessment Patient needs continued PT services  PT Problem List Decreased strength;Decreased range of motion;Decreased activity tolerance;Decreased balance;Decreased mobility;Decreased safety awareness;Decreased cognition;Pain       PT Treatment Interventions DME instruction;Gait training;Stair training;Functional mobility training;Therapeutic activities;Therapeutic exercise;Balance training;Neuromuscular re-education;Cognitive remediation;Patient/family education;Wheelchair mobility  training    PT Goals (Current goals can be found in the Care Plan section)  Acute Rehab PT Goals Patient Stated Goal: to start therapy PT Goal Formulation: With patient Time For Goal Achievement: 06/18/23 Potential to Achieve Goals: Fair    Frequency Min 1X/week     Co-evaluation PT/OT/SLP Co-Evaluation/Treatment: Yes Reason for Co-Treatment: Complexity of the patient's impairments (multi-system involvement);To address functional/ADL transfers PT goals addressed during session: Mobility/safety with mobility         AM-PAC PT "6 Clicks" Mobility  Outcome Measure Help needed turning from your back to your side while in a flat bed without using bedrails?: A Lot Help needed moving from lying on your back to sitting on the side of a flat bed without using bedrails?: Total Help needed moving to and from a bed to a chair (including a wheelchair)?: Total Help needed standing up from a chair using your arms (e.g., wheelchair or bedside chair)?: Total Help needed to walk in hospital room?: Total Help needed climbing 3-5 steps with a railing? : Total 6 Click Score: 7    End of Session Equipment Utilized During Treatment: Oxygen Activity Tolerance: Patient limited by fatigue;Patient limited by pain Patient left: in bed;with call bell/phone within reach;with SCD's reapplied;with nursing/sitter in room Nurse Communication: Mobility status PT Visit Diagnosis: Unsteadiness on feet (R26.81);Muscle weakness (generalized) (M62.81)    Time: 1610-9604 PT Time Calculation (min) (ACUTE ONLY): 20 min   Charges:   PT Evaluation $  PT Eval High Complexity: 1 High   PT General Charges $$ ACUTE PT VISIT: 1 Visit         Donna Bernard, PT, MPT   Ina Homes 06/04/2023, 9:21 AM

## 2023-06-04 NOTE — Assessment & Plan Note (Signed)
Resolved, K 4.4 Monitor & replace K PRN

## 2023-06-04 NOTE — Assessment & Plan Note (Signed)
Body mass index is 30.93 kg/m. Complicates overall care and prognosis.  Recommend lifestyle modifications including physical activity and diet for weight loss and overall long-term health.

## 2023-06-04 NOTE — Assessment & Plan Note (Signed)
Currently soft BP's Continue midodrine

## 2023-06-04 NOTE — Assessment & Plan Note (Signed)
Shock resolved, off pressors 12/1 See HCAP, Bacteremia Follow cultures & continue antibiotics

## 2023-06-04 NOTE — Assessment & Plan Note (Signed)
Due to pneumonia - see HCAP Remains on 4-6 L/min oxygen --Wean O2 as tolerated, goal spO2 > 90% --Pulmonary hygiene - IS, flutter --BiPAP as needed

## 2023-06-04 NOTE — Assessment & Plan Note (Signed)
Sliding scale novolog

## 2023-06-04 NOTE — Assessment & Plan Note (Signed)
Echo 06/03/23 - EF 30-35%, grade II DD, moderately elevated PASP, mod TR, mild AS --Volume mgmt by dialysis --Monitor volume status, hemodyamics

## 2023-06-04 NOTE — Assessment & Plan Note (Signed)
Dialysis per Nephrology Vascular surgery plans for fistulogram this afternoon - follow up findings

## 2023-06-04 NOTE — Assessment & Plan Note (Signed)
Continue Cefepime, Vancomycin Mucinex Remains on oxygen Scheduled Duonebs Q6H

## 2023-06-05 ENCOUNTER — Inpatient Hospital Stay: Payer: Medicare Other

## 2023-06-05 ENCOUNTER — Encounter: Payer: Self-pay | Admitting: Vascular Surgery

## 2023-06-05 DIAGNOSIS — I729 Aneurysm of unspecified site: Secondary | ICD-10-CM

## 2023-06-05 DIAGNOSIS — T82590A Other mechanical complication of surgically created arteriovenous fistula, initial encounter: Secondary | ICD-10-CM | POA: Diagnosis not present

## 2023-06-05 DIAGNOSIS — R7881 Bacteremia: Secondary | ICD-10-CM | POA: Diagnosis not present

## 2023-06-05 DIAGNOSIS — G9340 Encephalopathy, unspecified: Secondary | ICD-10-CM

## 2023-06-05 DIAGNOSIS — J189 Pneumonia, unspecified organism: Secondary | ICD-10-CM | POA: Diagnosis not present

## 2023-06-05 LAB — RENAL FUNCTION PANEL
Albumin: 2.2 g/dL — ABNORMAL LOW (ref 3.5–5.0)
Anion gap: 14 (ref 5–15)
BUN: 74 mg/dL — ABNORMAL HIGH (ref 8–23)
CO2: 21 mmol/L — ABNORMAL LOW (ref 22–32)
Calcium: 8.6 mg/dL — ABNORMAL LOW (ref 8.9–10.3)
Chloride: 94 mmol/L — ABNORMAL LOW (ref 98–111)
Creatinine, Ser: 4.67 mg/dL — ABNORMAL HIGH (ref 0.61–1.24)
GFR, Estimated: 12 mL/min — ABNORMAL LOW (ref >60)
Glucose, Bld: 147 mg/dL — ABNORMAL HIGH (ref 70–99)
Phosphorus: 6.4 mg/dL — ABNORMAL HIGH (ref 2.5–4.6)
Potassium: 4.8 mmol/L (ref 3.5–5.1)
Sodium: 129 mmol/L — ABNORMAL LOW (ref 135–145)

## 2023-06-05 LAB — CBC
HCT: 25.4 % — ABNORMAL LOW (ref 39.0–52.0)
Hemoglobin: 8.8 g/dL — ABNORMAL LOW (ref 13.0–17.0)
MCH: 33.8 pg (ref 26.0–34.0)
MCHC: 34.6 g/dL (ref 30.0–36.0)
MCV: 97.7 fL (ref 80.0–100.0)
Platelets: 119 10*3/uL — ABNORMAL LOW (ref 150–400)
RBC: 2.6 MIL/uL — ABNORMAL LOW (ref 4.22–5.81)
RDW: 17.9 % — ABNORMAL HIGH (ref 11.5–15.5)
WBC: 11.4 10*3/uL — ABNORMAL HIGH (ref 4.0–10.5)
nRBC: 0 % (ref 0.0–0.2)

## 2023-06-05 LAB — CALCIUM, IONIZED
Calcium, Ionized, Serum: 4.8 mg/dL (ref 4.5–5.6)
Calcium, Ionized, Serum: 4.7 mg/dL (ref 4.5–5.6)

## 2023-06-05 LAB — BLOOD GAS, VENOUS
Acid-base deficit: 3.1 mmol/L — ABNORMAL HIGH (ref 0.0–2.0)
Bicarbonate: 24 mmol/L (ref 20.0–28.0)
O2 Saturation: 76.6 %
Patient temperature: 37
pCO2, Ven: 50 mm[Hg] (ref 44–60)
pH, Ven: 7.29 (ref 7.25–7.43)
pO2, Ven: 46 mm[Hg] — ABNORMAL HIGH (ref 32–45)

## 2023-06-05 LAB — GLUCOSE, CAPILLARY
Glucose-Capillary: 128 mg/dL — ABNORMAL HIGH (ref 70–99)
Glucose-Capillary: 133 mg/dL — ABNORMAL HIGH (ref 70–99)
Glucose-Capillary: 134 mg/dL — ABNORMAL HIGH (ref 70–99)
Glucose-Capillary: 135 mg/dL — ABNORMAL HIGH (ref 70–99)
Glucose-Capillary: 152 mg/dL — ABNORMAL HIGH (ref 70–99)
Glucose-Capillary: 154 mg/dL — ABNORMAL HIGH (ref 70–99)

## 2023-06-05 LAB — APTT
aPTT: 82 s — ABNORMAL HIGH (ref 24–36)
aPTT: 74 s — ABNORMAL HIGH (ref 24–36)

## 2023-06-05 LAB — HEPARIN LEVEL (UNFRACTIONATED): Heparin Unfractionated: 1.05 [IU]/mL — ABNORMAL HIGH (ref 0.30–0.70)

## 2023-06-05 NOTE — Progress Notes (Signed)
Received patient at bedside in ICU.  Alert and oriented.  Informed consent signed and in chart.   TX duration: 3:00  Patient tolerated well.  Patient condition stable. Alert, without acute distress.  Hand-off given to patient's nurse.   Access used: LUE AVG Access issues: Minor cannulation issue d/t moderate swelling of LUE; Venous cannulation X 2.  Total UF removed: 3000 mL Medication(s) given: None Post HD VS: please see data insert    06/05/23 0100  Vitals  Temp (!) 97.1 F (36.2 C)  Temp Source Axillary  BP 114/69  MAP (mmHg) 83  BP Location Right Arm  BP Method Automatic  Patient Position (if appropriate) Lying  Pulse Rate 71  Pulse Rate Source Monitor  ECG Heart Rate 71  Resp 16  Oxygen Therapy  SpO2 100 %  O2 Device Bi-PAP  FiO2 (%) 35 %  Patient Activity (if Appropriate) In bed  Pulse Oximetry Type Continuous  Post Treatment  Dialyzer Clearance Lightly streaked  Hemodialysis Intake (mL) 0 mL  Liters Processed 71  Fluid Removed (mL) 3000 mL  Tolerated HD Treatment Yes  Post-Hemodialysis Comments Treatment completed and blood returned without issue.  AVG/AVF Arterial Site Held (minutes) 10 minutes (Gauze applied and secured with paper tape; hemostasis achieved.)  AVG/AVF Venous Site Held (minutes) 10 minutes (Gauze applied and secured with paper tape; hemostasis achieved.)  Note  Patient Observations Patient resting, no acute distress noted; patient condition stable upon this treatment discharge.  Fistula / Graft Left Upper arm Arteriovenous vein graft  Placement Date/Time: 05/02/22 1001   Placed prior to admission: No  Orientation: Left  Access Location: Upper arm  Access Type: Arteriovenous vein graft  Expiration Date: 11/22/26  Site Condition No complications  Fistula / Graft Assessment Present;Thrill;Bruit  Status Flushed;Patent;Deaccessed  Drainage Description None      [facility]

## 2023-06-05 NOTE — Consult Note (Signed)
PHARMACY - ANTICOAGULATION CONSULT NOTE  Pharmacy Consult for heparin  Indication: atrial fibrillation  Allergies  Allergen Reactions   Zestril [Lisinopril] Cough   Hydrocodone Nausea Only   Januvia [Sitagliptin] Other (See Comments)    Unknown   Levitra [Vardenafil] Other (See Comments)    flushing   Prednisone Other (See Comments)    Elevates BGL; patient prefers to not take this   Victoza [Liraglutide] Other (See Comments)    GI upset   Penicillins Rash and Other (See Comments)    Rash around ankles Has patient had a PCN reaction causing immediate rash, facial/tongue/throat swelling, SOB or lightheadedness with hypotension: Yes Has patient had a PCN reaction causing severe rash involving mucus membranes or skin necrosis: Unk Has patient had a PCN reaction that required hospitalization: No Has patient had a PCN reaction occurring within the last 10 years: No If all of the above answers are "NO", then may proceed with Cephalosporin use.     Patient Measurements: Height: 5\' 2"  (157.5 cm) Weight: 72.8 kg (160 lb 7.9 oz) IBW/kg (Calculated) : 54.6 Heparin Dosing Weight: 70.4 kg  Vital Signs: Temp: 98.1 F (36.7 C) (12/03 0400) Temp Source: Axillary (12/03 0400) BP: 121/77 (12/03 0600) Pulse Rate: 87 (12/03 0600)  Labs: Recent Labs     0000 06/02/23 1044 06/02/23 1400 06/02/23 2150 06/03/23 0350 06/03/23 0906 06/04/23 0428 06/04/23 0735 06/04/23 0931 06/04/23 1828 06/05/23 0506  HGB   < >  --   --   --  8.4*  --  9.0*  --   --   --  8.8*  HCT  --   --   --   --  24.1*  --  25.7*  --   --   --  25.4*  PLT  --   --   --   --  86*  --  79*  --   --   --  119*  APTT  --  66*  --  62*  --    < >  --    < > 177* 158* 82*  HEPARINUNFRC  --  >1.10*  --   --  >1.10*  --   --   --   --   --  1.05*  CREATININE  --   --    < > 2.27* 2.83*  --  3.82*  --   --   --   --    < > = values in this interval not displayed.    Estimated Creatinine Clearance: 12.6 mL/min (A) (by  C-G formula based on SCr of 3.82 mg/dL (H)).   Medical History: Past Medical History:  Diagnosis Date   Arthritis    CHF (congestive heart failure) (HCC)    HFrEF   CKD (chronic kidney disease)    sees [place] in [place]   Coronary artery disease    Diabetes (HCC)    type 2   sees endo at [place] in [place]   ESRD (end stage renal disease) on dialysis [facility])    M,W,F   Hypercholesteremia    Hypertension    Myocardial infarction (HCC) 1990   Neuromuscular disorder (HCC)    Pneumonia    PONV (postoperative nausea and vomiting)     PTA Medications:  - Apixaban 2.5 mg BID for new Afib diagnosis, last dose was 11/29 at 0759   Assessment: Patient is an 82 y.o. male who presented to the ED from SNF on 11/29 with AMS and fever. PMH  includes CAD, HTN, OSA, Afib, HFrEF with EF < 20%, T2DM,  thrombocytopenia, and ESRD on HD MWF. Most recent hospitalization was 11/15-11/27, patient diagnosed with Afib and started on amiodarone and Eliquis. Patient admitted  to ICU 11/29 for severe sepsis and hypotension with SBP in the 50s. NPO until mental status improves. Hgb 9.7, Plt 100. Baseline HL > 1.10 due to Eliquis PTA, aPTT elevated at 66 s.  Date/Time aPTT/HL Rate  Comment 11/30 2150 62  1000 units/hr  Subtherapeutic 12/01 0906 57  1150 units/hr Subtherapeutic 12/01 2017 >200  1150 units/hr SUPRAtherapeutic 12/01 0931 177  950 units/hr SUPRAtherapeutic 12/02 1828 158  750 units/hr SUPRAtherapeutic 12/03 0506      82 /  1.05        600 units/hr     Therapeutic X 1   Goal of Therapy:  Heparin level 0.3-0.7 units/ml aPTT 90-102 s Monitor platelets by anticoagulation protocol: Yes   Plan:  12/03 @ 0506:  aPTT = 82,   HL = > 1.05 - aPTT therapeutic X 1,  HL elevated from Eliquis PTA - will continue pt on current rate and recheck aPTT in 8 hrs  - will recheck HL on 12/4 with AM labs  - Titrate heparin drip using aPTT until level correlates with HL, then transition to HL dosing   Thank you  for involving pharmacy in this patient's care.   Bridgit Eynon D Clinical Pharmacist 06/05/2023 6:41 AM

## 2023-06-05 NOTE — Consult Note (Signed)
NAME: Robert Lucas  DOB: [birth year]  MRN: [id number]  Date/Time: 06/05/2023 3:13 PM  REQUESTING PROVIDER: Dr.Griffith Subjective:  REASON FOR CONSULT: staph warneri bacteremia ?No history from patient , chart reviewed Robert Lucas is a 82 y.o. with a history of Afib, DM, thoracic back pain, ESRD, HTN, DM, CAD, gout, Bells palsy 11/15-11/27 hospitalization at [facility] for Afib Presented to the ED with fever and confusion from SNF on 06/01/23  06/01/23  BP 125/53 !  Temp 100.4 F (38 C) !  Pulse Rate 94  Resp 16  SpO2 100 %  O2 Flow Rate (L/min) 4 L/min  Weight 166 lb    Latest Reference Range & Units 06/01/23  WBC 4.0 - 10.5 K/uL 3.3 (L)  Hemoglobin 13.0 - 17.0 g/dL 65.7 (L)  HCT 84.6 - 96.2 % 30.2 (L)  Platelets 150 - 400 K/uL 97 (L) [1]  Creatinine 0.61 - 1.24 mg/dL 9.52 (H)  Blood culture sent on 11/29 and started on broad spectrum antibiotic CT head no acute changes Left arm below the fistula had a lot of swelling and US done no thrombosis He underwent brachial fistulogram and a medium to large pseudoaneurysm near the venous access seen along with many small pseudoaneurysms and he had a stent placed Blood culture came positive for staph warnerii and I am seeing him for that   Past Medical History:  Diagnosis Date   Arthritis    CHF (congestive heart failure) (HCC)    HFrEF   CKD (chronic kidney disease)    sees [place] in [place]   Coronary artery disease    Diabetes (HCC)    type 2   sees endo at [place] in [place]   ESRD (end stage renal disease) on dialysis [facility])    M,W,F   Hypercholesteremia    Hypertension    Myocardial infarction (HCC) 1990   Neuromuscular disorder (HCC)    Pneumonia    PONV (postoperative nausea and vomiting)     Past Surgical History:  Procedure Laterality Date   A/V FISTULAGRAM Left 06/04/2023   Procedure: A/V Fistulagram;  Surgeon: Annice Needy, MD;  Location: [facility] INVASIVE CV LAB;  Service: Cardiovascular;  Laterality: Left;    AV FISTULA PLACEMENT Left 05/17/2021   Procedure: LEFT ARM ARTERIOVENOUS (AV) FISTULA CREATION;  Surgeon: Victorino Sparrow, MD;  Location: [facility] OR;  Service: Vascular;  Laterality: Left;  PERIPHERAL NERVE BLOCK   AV FISTULA PLACEMENT Left 05/02/2022   Procedure: INSERTION OF LEFT ARTERIOVENOUS (AV) GORE-TEX GRAFT;  Surgeon: Victorino Sparrow, MD;  Location: [facility] OR;  Service: Vascular;  Laterality: Left;   BACK SURGERY  yrs ago   lower   CARDIAC CATHETERIZATION     CARDIOVERSION N/A 05/24/2023   Procedure: CARDIOVERSION (CATH LAB);  Surgeon: Jake Bathe, MD;  Location: [facility] INVASIVE CV LAB;  Service: Cardiovascular;  Laterality: N/A;   CATARACT EXTRACTION Right 01/2021   CHOLECYSTECTOMY N/A 05/02/2017   Procedure: LAPAROSCOPIC CHOLECYSTECTOMY;  Surgeon: Axel Filler, MD;  Location: [facility] OR;  Service: General;  Laterality: N/A;   CORONARY ANGIOPLASTY     2 1990   EYE SURGERY Left 03/2021   cataract removal   IR FLUORO GUIDE CV LINE RIGHT  10/15/2021   IR US GUIDE VASC ACCESS RIGHT  10/15/2021   KNEE SURGERY Left yrs ago   arthroscopic   LIGATION OF COMPETING BRANCHES OF ARTERIOVENOUS FISTULA Left 11/22/2021   Procedure: LEFT ARM FISTULA BRANCH LIGATION;  Surgeon: Victorino Sparrow, MD;  Location: [facility]  OR;  Service: Vascular;  Laterality: Left;   PERIPHERAL VASCULAR BALLOON ANGIOPLASTY  10/19/2021   Procedure: PERIPHERAL VASCULAR BALLOON ANGIOPLASTY;  Surgeon: Victorino Sparrow, MD;  Location: [facility] INVASIVE CV LAB;  Service: Cardiovascular;;  Left AVF   ROTATOR CUFF REPAIR Left yrs ago   SHOULDER OPEN ROTATOR CUFF REPAIR Right 04/23/2013   Procedure: RIGHT SHOULDER ROTATOR CUFF REPAIR WITH GRAFT AND ANCHORS ;  Surgeon: Jacki Cones, MD;  Location: [facility] ORS;  Service: Orthopedics;  Laterality: Right;   TONSILLECTOMY     TOTAL HIP ARTHROPLASTY Right 05/06/2019   Procedure: TOTAL HIP ARTHROPLASTY ANTERIOR APPROACH;  Surgeon: Durene Romans, MD;  Location: [facility] ORS;  Service: Orthopedics;  Laterality:  Right;  70 mins   TRANSESOPHAGEAL ECHOCARDIOGRAM (CATH LAB) N/A 05/24/2023   Procedure: TRANSESOPHAGEAL ECHOCARDIOGRAM;  Surgeon: Jake Bathe, MD;  Location: [facility] INVASIVE CV LAB;  Service: Cardiovascular;  Laterality: N/A;    Social History   Socioeconomic History   Marital status: Married    Spouse name: Not on file   Number of children: 3   Years of education: Not on file   Highest education level: Not on file  Occupational History   Not on file  Tobacco Use   Smoking status: Former    Types: Cigars    Quit date: 07/03/1988    Years since quitting: 34.9    Passive exposure: Never   Smokeless tobacco: Never  Vaping Use   Vaping status: Never Used  Substance and Sexual Activity   Alcohol use: Not Currently    Comment: occasional beer   Drug use: No   Sexual activity: Not Currently  Other Topics Concern   Not on file  Social History Narrative   Not on file   Social Determinants of Health   Financial Resource Strain: Not on file  Food Insecurity: No Food Insecurity (06/02/2023)   Hunger Vital Sign    Worried About Running Out of Food in the Last Year: Never true    Ran Out of Food in the Last Year: Never true  Transportation Needs: No Transportation Needs (06/02/2023)   PRAPARE - Administrator, Civil Service (Medical): No    Lack of Transportation (Non-Medical): No  Physical Activity: Not on file  Stress: Not on file  Social Connections: Not on file  Intimate Partner Violence: Not At Risk (06/02/2023)   Humiliation, Afraid, Rape, and Kick questionnaire    Fear of Current or Ex-Partner: No    Emotionally Abused: No    Physically Abused: No    Sexually Abused: No    Family History  Problem Relation Age of Onset   Dementia Mother    Stroke Brother    Allergies  Allergen Reactions   Zestril [Lisinopril] Cough   Hydrocodone Nausea Only   Januvia [Sitagliptin] Other (See Comments)    Unknown   Levitra [Vardenafil] Other (See Comments)    flushing    Prednisone Other (See Comments)    Elevates BGL; patient prefers to not take this   Victoza [Liraglutide] Other (See Comments)    GI upset   Penicillins Rash and Other (See Comments)    Rash around ankles Has patient had a PCN reaction causing immediate rash, facial/tongue/throat swelling, SOB or lightheadedness with hypotension: Yes Has patient had a PCN reaction causing severe rash involving mucus membranes or skin necrosis: Unk Has patient had a PCN reaction that required hospitalization: No Has patient had a PCN reaction occurring within the last 10 years:  No If all of the above answers are "NO", then may proceed with Cephalosporin use.    I? Current Facility-Administered Medications  Medication Dose Route Frequency Provider Last Rate Last Admin   acetaminophen (TYLENOL) suppository 650 mg  650 mg Rectal Q6H PRN Annice Needy, MD       acetaminophen (TYLENOL) tablet 1,000 mg  1,000 mg Oral Q6H PRN Annice Needy, MD   1,000 mg at 06/03/23 2033   albumin human 25 % solution 25 g  25 g Intravenous Once in dialysis Annice Needy, MD       allopurinol (ZYLOPRIM) tablet 100 mg  100 mg Oral Daily Annice Needy, MD   100 mg at 06/05/23 5284   amiodarone (PACERONE) tablet 200 mg  200 mg Oral Daily Annice Needy, MD   200 mg at 06/05/23 1324   atorvastatin (LIPITOR) tablet 40 mg  40 mg Oral QHS Annice Needy, MD   40 mg at 06/03/23 2158   ceFEPIme (MAXIPIME) 1 g in sodium chloride 0.9 % 100 mL IVPB  1 g Intravenous Q24H Annice Needy, MD   Stopped at 06/04/23 2259   Chlorhexidine Gluconate Cloth 2 % PADS 6 each  6 each Topical Daily Annice Needy, MD   6 each at 06/05/23 0933   dextromethorphan-guaiFENesin (MUCINEX DM) 30-600 MG per 12 hr tablet 1 tablet  1 tablet Oral BID PRN Annice Needy, MD       epoetin alfa-epbx (RETACRIT) injection 4,000 Units  4,000 Units Subcutaneous Q M,W,F-HD Dew, Marlow Baars, MD       heparin injection 1,000 Units  1,000 Units Intracatheter PRN Wyn Quaker, Marlow Baars, MD       insulin  aspart (novoLOG) injection 0-15 Units  0-15 Units Subcutaneous Q4H Annice Needy, MD   2 Units at 06/05/23 0932   ipratropium-albuterol (DUONEB) 0.5-2.5 (3) MG/3ML nebulizer solution 3 mL  3 mL Nebulization Q6H Annice Needy, MD   3 mL at 06/05/23 1248   midodrine (PROAMATINE) tablet 10 mg  10 mg Oral TID WC Annice Needy, MD   10 mg at 06/05/23 0932   ondansetron (ZOFRAN) injection 4 mg  4 mg Intravenous Q8H PRN Annice Needy, MD       Oral care mouth rinse  15 mL Mouth Rinse 4 times per day Annice Needy, MD   15 mL at 06/05/23 1200   Oral care mouth rinse  15 mL Mouth Rinse PRN Annice Needy, MD       pantoprazole (PROTONIX) injection 40 mg  40 mg Intravenous Daily Annice Needy, MD   40 mg at 06/05/23 0931   polyethylene glycol (MIRALAX / GLYCOLAX) packet 17 g  17 g Oral Daily PRN Annice Needy, MD       sodium chloride flush (NS) 0.9 % injection 10-40 mL  10-40 mL Intracatheter Q12H Annice Needy, MD   10 mL at 06/04/23 2230   sodium chloride flush (NS) 0.9 % injection 10-40 mL  10-40 mL Intracatheter PRN Annice Needy, MD       sodium chloride flush (NS) 0.9 % injection 3 mL  3 mL Intravenous Q12H Annice Needy, MD   3 mL at 06/05/23 0930   sodium chloride tablet 1 g  1 g Oral BID WC Annice Needy, MD   1 g at 06/05/23 0928   traMADol (ULTRAM) tablet 50 mg  50 mg Oral Q8H PRN Festus Barren  S, MD   50 mg at 06/03/23 1458   vancomycin (VANCOREADY) IVPB 750 mg/150 mL  750 mg Intravenous Q M,W,F-HD Annice Needy, MD   Paused at 06/04/23 1342     Abtx:  Anti-infectives (From admission, onward)    Start     Dose/Rate Route Frequency Ordered Stop   06/04/23 1200  vancomycin (VANCOREADY) IVPB 750 mg/150 mL        750 mg 150 mL/hr over 60 Minutes Intravenous Every M-W-F (Hemodialysis) 06/01/23 2020     06/04/23 1131  ceFAZolin (ANCEF) IVPB 1 g/50 mL premix  Status:  Discontinued        1 g 100 mL/hr over 30 Minutes Intravenous 30 min pre-op 06/04/23 1131 06/04/23 1535   06/02/23 1800  vancomycin (VANCOREADY)  IVPB 750 mg/150 mL  Status:  Discontinued        750 mg 150 mL/hr over 60 Minutes Intravenous  Once 06/02/23 1430 06/02/23 1431   06/02/23 1800  vancomycin (VANCOREADY) IVPB 750 mg/150 mL        750 mg 150 mL/hr over 60 Minutes Intravenous  Once 06/02/23 1431 06/02/23 2137   06/01/23 2200  ceFEPIme (MAXIPIME) 1 g in sodium chloride 0.9 % 100 mL IVPB        1 g 200 mL/hr over 30 Minutes Intravenous Every 24 hours 06/01/23 2020     06/01/23 2030  vancomycin (VANCOREADY) IVPB 1500 mg/300 mL        1,500 mg 150 mL/hr over 120 Minutes Intravenous  Once 06/01/23 2020 06/02/23 0334   06/01/23 1830  cefTRIAXone (ROCEPHIN) 2 g in sodium chloride 0.9 % 100 mL IVPB  Status:  Discontinued        2 g 200 mL/hr over 30 Minutes Intravenous Every 24 hours 06/01/23 1822 06/01/23 2009   06/01/23 1830  azithromycin (ZITHROMAX) 500 mg in sodium chloride 0.9 % 250 mL IVPB  Status:  Discontinued        500 mg 250 mL/hr over 60 Minutes Intravenous Every 24 hours 06/01/23 1822 06/01/23 2009       REVIEW OF SYSTEMS:  NA Objective:  VITALS:  BP (!) 111/52   Pulse 87   Temp 98.2 F (36.8 C)   Resp 20   Ht 5\' 2"  (1.575 m)   Wt 72.8 kg   SpO2 98%   BMI 29.35 kg/m  LDA Foley Central line Other drainage tubes PHYSICAL EXAM:  General: confused somnolent Answers simple questions but perseverates  Head: Normocephalic, without obvious abnormality, atraumatic. Eyes: Conjunctivae clear, anicteric sclerae. Pupils are equal ENTcannot examine Neck: Supple, symmetrical, no adenopathy, thyroid: non tender no carotid bruit and no JVD. Lungs: Clear to auscultation bilaterally air entry. No Wheezing or Rhonchi. No rales. Heart: tachycardia Abdomen: Soft, non-tender,not distended. Bowel sounds normal. No masses Extremities: left arm swollen Small wound on the lateral aspect below elbow which chalky white discharge     Skin: bruising Lymph: Cervical, supraclavicular normal. Neurologic: cannot assess in  detail Pertinent Labs Lab Results CBC    Component Value Date/Time   WBC 11.4 (H) 06/05/2023 0506   RBC 2.60 (L) 06/05/2023 0506   HGB 8.8 (L) 06/05/2023 0506   HCT 25.4 (L) 06/05/2023 0506   PLT 119 (L) 06/05/2023 0506   MCV 97.7 06/05/2023 0506   MCH 33.8 06/05/2023 0506   MCHC 34.6 06/05/2023 0506   RDW 17.9 (H) 06/05/2023 0506   LYMPHSABS 0.6 (L) 06/01/2023 1819   MONOABS 0.3 06/01/2023 1819   EOSABS  0.0 06/01/2023 1819   BASOSABS 0.0 06/01/2023 1819       Latest Ref Rng & Units 06/05/2023    5:06 AM 06/04/2023    4:28 AM 06/03/2023    3:50 AM  CMP  Glucose 70 - 99 mg/dL 956  213  086   BUN 8 - 23 mg/dL 74  57  40   Creatinine 0.61 - 1.24 mg/dL 5.78  4.69  6.29   Sodium 135 - 145 mmol/L 129  126  127   Potassium 3.5 - 5.1 mmol/L 4.8  4.4  3.5   Chloride 98 - 111 mmol/L 94  92  91   CO2 22 - 32 mmol/L 21  21  22    Calcium 8.9 - 10.3 mg/dL 8.6  8.7  8.0   Total Protein 6.5 - 8.1 g/dL  5.4  5.4   Total Bilirubin <1.2 mg/dL  1.1  1.2   Alkaline Phos 38 - 126 U/L  150  146   AST 15 - 41 U/L  47  64   ALT 0 - 44 U/L  31  32       Microbiology: Recent Results (from the past 240 hour(s))  Culture, blood (x 2)     Status: None (Preliminary result)   Collection Time: 06/01/23  6:22 PM   Specimen: BLOOD  Result Value Ref Range Status   Specimen Description BLOOD BLOOD LEFT ARM  Final   Special Requests   Final    BOTTLES DRAWN AEROBIC AND ANAEROBIC Blood Culture adequate volume   Culture   Final    NO GROWTH 4 DAYS Performed at [facility], [address], [place], [place] [address]    Report Status PENDING  Incomplete  Culture, blood (x 2)     Status: Abnormal   Collection Time: 06/01/23  6:22 PM   Specimen: BLOOD  Result Value Ref Range Status   Specimen Description   Final    BLOOD NECK Performed at [facility], [address], [place], [place] [address]    Special Requests   Final    BOTTLES DRAWN AEROBIC AND ANAEROBIC Blood Culture  adequate volume Performed at [facility], [address] [place], [place] [address]    Culture  Setup Time   Final    GRAM POSITIVE COCCI IN BOTH AEROBIC AND ANAEROBIC BOTTLES CRITICAL RESULT CALLED TO, READ BACK BY AND VERIFIED WITH: ANDREA DOBBS ON 06/02/23 AT 1126 QSD    Culture (A)  Final    STAPHYLOCOCCUS WARNERI THE SIGNIFICANCE OF ISOLATING THIS ORGANISM FROM A SINGLE SET OF BLOOD CULTURES WHEN MULTIPLE SETS ARE DRAWN IS UNCERTAIN. PLEASE NOTIFY THE MICROBIOLOGY DEPARTMENT WITHIN ONE WEEK IF SPECIATION AND SENSITIVITIES ARE REQUIRED. Performed at [facility], [address] [place], [place] [address]    Report Status 06/04/2023 FINAL  Final  Blood Culture ID Panel (Reflexed)     Status: Abnormal   Collection Time: 06/01/23  6:22 PM  Result Value Ref Range Status   Enterococcus faecalis NOT DETECTED NOT DETECTED Final   Enterococcus Faecium NOT DETECTED NOT DETECTED Final   Listeria monocytogenes NOT DETECTED NOT DETECTED Final   Staphylococcus species DETECTED (A) NOT DETECTED Final    Comment: CRITICAL RESULT CALLED TO, READ BACK BY AND VERIFIED WITH: ANDREA DOBBS ON 06/02/23 AT 1126 QSD    Staphylococcus aureus (BCID) NOT DETECTED NOT DETECTED Final   Staphylococcus epidermidis NOT DETECTED NOT DETECTED Final   Staphylococcus lugdunensis NOT DETECTED  NOT DETECTED Final   Streptococcus species NOT DETECTED NOT DETECTED Final   Streptococcus agalactiae NOT DETECTED NOT DETECTED Final   Streptococcus pneumoniae NOT DETECTED NOT DETECTED Final   Streptococcus pyogenes NOT DETECTED NOT DETECTED Final   A.calcoaceticus-baumannii NOT DETECTED NOT DETECTED Final   Bacteroides fragilis NOT DETECTED NOT DETECTED Final   Enterobacterales NOT DETECTED NOT DETECTED Final   Enterobacter cloacae complex NOT DETECTED NOT DETECTED Final   Escherichia coli NOT DETECTED NOT DETECTED Final   Klebsiella aerogenes NOT DETECTED NOT DETECTED Final   Klebsiella oxytoca NOT  DETECTED NOT DETECTED Final   Klebsiella pneumoniae NOT DETECTED NOT DETECTED Final   Proteus species NOT DETECTED NOT DETECTED Final   Salmonella species NOT DETECTED NOT DETECTED Final   Serratia marcescens NOT DETECTED NOT DETECTED Final   Haemophilus influenzae NOT DETECTED NOT DETECTED Final   Neisseria meningitidis NOT DETECTED NOT DETECTED Final   Pseudomonas aeruginosa NOT DETECTED NOT DETECTED Final   Stenotrophomonas maltophilia NOT DETECTED NOT DETECTED Final   Candida albicans NOT DETECTED NOT DETECTED Final   Candida auris NOT DETECTED NOT DETECTED Final   Candida glabrata NOT DETECTED NOT DETECTED Final   Candida krusei NOT DETECTED NOT DETECTED Final   Candida parapsilosis NOT DETECTED NOT DETECTED Final   Candida tropicalis NOT DETECTED NOT DETECTED Final   Cryptococcus neoformans/gattii NOT DETECTED NOT DETECTED Final    Comment: Performed at [facility], [address] [place], [place] [address]  Resp panel by RT-PCR (RSV, Flu A&B, Covid) Anterior Nasal Swab     Status: None   Collection Time: 06/01/23  7:14 PM   Specimen: Anterior Nasal Swab  Result Value Ref Range Status   SARS Coronavirus 2 by RT PCR NEGATIVE NEGATIVE Final    Comment: (NOTE) SARS-CoV-2 target nucleic acids are NOT DETECTED.  The SARS-CoV-2 RNA is generally detectable in upper respiratory specimens during the acute phase of infection. The lowest concentration of SARS-CoV-2 viral copies this assay can detect is 138 copies/mL. A negative result does not preclude SARS-Cov-2 infection and should not be used as the sole basis for treatment or other patient management decisions. A negative result may occur with  improper specimen collection/handling, submission of specimen other than nasopharyngeal swab, presence of viral mutation(s) within the areas targeted by this assay, and inadequate number of viral copies(<138 copies/mL). A negative result must be combined with clinical  observations, patient history, and epidemiological information. The expected result is Negative.  Fact Sheet for Patients:  BloggerCourse.com  Fact Sheet for Healthcare Providers:  SeriousBroker.it  This test is no t yet approved or cleared by the [place] FDA and  has been authorized for detection and/or diagnosis of SARS-CoV-2 by FDA under an Emergency Use Authorization (EUA). This EUA will remain  in effect (meaning this test can be used) for the duration of the COVID-19 declaration under Section 564(b)(1) of the Act, 21 U.S.C.section 360bbb-3(b)(1), unless the authorization is terminated  or revoked sooner.       Influenza A by PCR NEGATIVE NEGATIVE Final   Influenza B by PCR NEGATIVE NEGATIVE Final    Comment: (NOTE) The Xpert Xpress SARS-CoV-2/FLU/RSV plus assay is intended as an aid in the diagnosis of influenza from Nasopharyngeal swab specimens and should not be used as a sole basis for treatment. Nasal washings and aspirates are unacceptable for Xpert Xpress SARS-CoV-2/FLU/RSV testing.  Fact Sheet for Patients: BloggerCourse.com  Fact Sheet for Healthcare Providers: SeriousBroker.it  This test is not yet approved  or cleared by the [place] and has been authorized for detection and/or diagnosis of SARS-CoV-2 by FDA under an Emergency Use Authorization (EUA). This EUA will remain in effect (meaning this test can be used) for the duration of the COVID-19 declaration under Section 564(b)(1) of the Act, 21 U.S.C. section 360bbb-3(b)(1), unless the authorization is terminated or revoked.     Resp Syncytial Virus by PCR NEGATIVE NEGATIVE Final    Comment: (NOTE) Fact Sheet for Patients: BloggerCourse.com  Fact Sheet for Healthcare Providers: SeriousBroker.it  This test is not yet approved or cleared by  the [place] FDA and has been authorized for detection and/or diagnosis of SARS-CoV-2 by FDA under an Emergency Use Authorization (EUA). This EUA will remain in effect (meaning this test can be used) for the duration of the COVID-19 declaration under Section 564(b)(1) of the Act, 21 U.S.C. section 360bbb-3(b)(1), unless the authorization is terminated or revoked.  Performed at [facility], [address] [place], [place] [address]   MRSA Next Gen by PCR, Nasal     Status: None   Collection Time: 06/02/23  2:11 AM   Specimen: Nasal Mucosa; Nasal Swab  Result Value Ref Range Status   MRSA by PCR Next Gen NOT DETECTED NOT DETECTED Final    Comment: (NOTE) The GeneXpert MRSA Assay (FDA approved for NASAL specimens only), is one component of a comprehensive MRSA colonization surveillance program. It is not intended to diagnose MRSA infection nor to guide or monitor treatment for MRSA infections. Test performance is not FDA approved in patients less than 78 years old. Performed at [facility], [address] [place], [place] [address]   Culture, blood (Routine X 2) w Reflex to ID Panel     Status: None (Preliminary result)   Collection Time: 06/02/23  1:13 PM   Specimen: BLOOD  Result Value Ref Range Status   Specimen Description BLOOD BLOOD RIGHT HAND  Final   Special Requests   Final    BOTTLES DRAWN AEROBIC ONLY Blood Culture adequate volume   Culture   Final    NO GROWTH 3 DAYS Performed at [facility], [address], [place], [place] [address]    Report Status PENDING  Incomplete  Culture, blood (Routine X 2) w Reflex to ID Panel     Status: None (Preliminary result)   Collection Time: 06/02/23  2:15 PM   Specimen: BLOOD  Result Value Ref Range Status   Specimen Description BLOOD PICC LINE  Final   Special Requests   Final    BOTTLES DRAWN AEROBIC AND ANAEROBIC Blood Culture adequate volume   Culture   Final    NO GROWTH 3  DAYS Performed at [facility], [address] [place], [place] [address]    Report Status PENDING  Incomplete    IMAGING RESULTS: CT head no acute changes I have personally reviewed the films ? Impression/Recommendation Encephalopathy could be from infection Stap warneri bacteremia Likely source is hte AV graft  Left arm graft- swollen wound present- chalky discharge infection VS gout  culture sent May be the source of the bacteremia Malfunctioning graft with pseudoaneurysm- s/p fistulogram and placement of stent  Pt will need minimum 4 weeks of antibiotic On vanco- can be given during dialysis Will get 2 d echo Repeat culture  ?   _Afib on amiodarone  _______________________________________________ Discussed with his nurse Note:  This document was prepared using Dragon voice recognition software and may include unintentional dictation  errors.

## 2023-06-05 NOTE — Progress Notes (Signed)
Pharmacy - Antimicrobial Stewardship  Called microbiology lab to request susceptibility testing on the 11/29 S. Warneri in blood culture   Juliette Alcide, PharmD, BCPS, BCIDP Work Cell: [phone] 06/05/2023 4:20 PM

## 2023-06-05 NOTE — Consult Note (Signed)
PHARMACY - ANTICOAGULATION CONSULT NOTE  Pharmacy Consult for heparin  Indication: atrial fibrillation  Allergies  Allergen Reactions   Zestril [Lisinopril] Cough   Hydrocodone Nausea Only   Januvia [Sitagliptin] Other (See Comments)    Unknown   Levitra [Vardenafil] Other (See Comments)    flushing   Prednisone Other (See Comments)    Elevates BGL; patient prefers to not take this   Victoza [Liraglutide] Other (See Comments)    GI upset   Penicillins Rash and Other (See Comments)    Rash around ankles Has patient had a PCN reaction causing immediate rash, facial/tongue/throat swelling, SOB or lightheadedness with hypotension: Yes Has patient had a PCN reaction causing severe rash involving mucus membranes or skin necrosis: Unk Has patient had a PCN reaction that required hospitalization: No Has patient had a PCN reaction occurring within the last 10 years: No If all of the above answers are "NO", then may proceed with Cephalosporin use.     Patient Measurements: Height: 5\' 2"  (157.5 cm) Weight: 72.8 kg (160 lb 7.9 oz) IBW/kg (Calculated) : 54.6 Heparin Dosing Weight: 70.4 kg  Vital Signs: Temp: 98.2 F (36.8 C) (12/03 1200) Temp Source: Axillary (12/03 0400) BP: 111/52 (12/03 1300) Pulse Rate: 87 (12/03 1300)  Labs: Recent Labs    06/03/23 0350 06/03/23 0906 06/04/23 0428 06/04/23 0735 06/04/23 1828 06/05/23 0506 06/05/23 1226  HGB 8.4*  --  9.0*  --   --  8.8*  --   HCT 24.1*  --  25.7*  --   --  25.4*  --   PLT 86*  --  79*  --   --  119*  --   APTT  --    < >  --    < > 158* 82* 74*  HEPARINUNFRC >1.10*  --   --   --   --  1.05*  --   CREATININE 2.83*  --  3.82*  --   --  4.67*  --    < > = values in this interval not displayed.    Estimated Creatinine Clearance: 10.3 mL/min (A) (by C-G formula based on SCr of 4.67 mg/dL (H)).   Medical History: Past Medical History:  Diagnosis Date   Arthritis    CHF (congestive heart failure) (HCC)    HFrEF    CKD (chronic kidney disease)    sees [place] in [place]   Coronary artery disease    Diabetes (HCC)    type 2   sees endo at [place] in [place]   ESRD (end stage renal disease) on dialysis [facility])    M,W,F   Hypercholesteremia    Hypertension    Myocardial infarction (HCC) 1990   Neuromuscular disorder (HCC)    Pneumonia    PONV (postoperative nausea and vomiting)     PTA Medications:  - Apixaban 2.5 mg BID for new Afib diagnosis, last dose was 11/29 at 0759   Assessment: Patient is an 82 y.o. male who presented to the ED from SNF on 11/29 with AMS and fever. PMH includes CAD, HTN, OSA, Afib, HFrEF with EF < 20%, T2DM,  thrombocytopenia, and ESRD on HD MWF. Most recent hospitalization was 11/15-11/27, patient diagnosed with Afib and started on amiodarone and Eliquis. Patient admitted  to ICU 11/29 for severe sepsis and hypotension with SBP in the 50s. NPO until mental status improves. Hgb 9.7, Plt 100. Baseline HL > 1.10 due to Eliquis PTA, aPTT elevated at 66 s.  Date/Time aPTT/HL Rate  Comment 11/30 2150 62  1000 units/hr  Subtherapeutic 12/01 0906 57  1150 units/hr Subtherapeutic 12/01 2017 >200  1150 units/hr SUPRAtherapeutic 12/01 0931 177  950 units/hr SUPRAtherapeutic 12/02 1828 158  750 units/hr SUPRAtherapeutic 12/03 0506      82 /  1.05        600 units/hr     Therapeutic X 1  12/03 1226 74 /  600 units/hr Therapeutic X 2  Goal of Therapy:  Heparin level 0.3-0.7 units/ml aPTT 90-102 s Monitor platelets by anticoagulation protocol: Yes   Plan:  - aPTT therapeutic X 2,  HL elevated from Eliquis PTA - Will continue current heparin rate at 600 units/hr  - will recheck aPTT/HL on 12/4 with AM labs  - Titrate heparin drip using aPTT until level correlates with HL, then transition to HL dosing   Thank you for involving pharmacy in this patient's care.   Bettey Costa Clinical Pharmacist 06/05/2023 2:09 PM

## 2023-06-05 NOTE — Progress Notes (Signed)
[facility] [place], [place] 06/05/23  Subjective:   Hospital day # 4 Robert Lucas is a 82 y.o. male  hx of sCHF with EF< 20%, A fib on Eliquis, HTN, HLD, DM, CAD, gout, BPH, anemia, Bell's palsy, obesity, OSA not using CPAP, former smoker, chronic hyponatremia, admitted due to sepsis and HCAP,    Patient underwent hemodialysis treatment yesterday. UF achieved was 3 kg.  Objective:  Vital signs in last 24 hours:  Temp:  [97.1 F (36.2 C)-98.4 F (36.9 C)] 98.1 F (36.7 C) (12/03 0400) Pulse Rate:  [68-91] 87 (12/03 0600) Resp:  [13-30] 24 (12/03 0600) BP: (84-142)/(46-80) 121/77 (12/03 0600) SpO2:  [85 %-100 %] 99 % (12/03 0600) FiO2 (%):  [35 %] 35 % (12/03 0300) Weight:  [72.8 kg] 72.8 kg (12/03 0500)  Weight change: -3.9 kg Filed Weights   06/03/23 0411 06/04/23 0500 06/05/23 0500  Weight: 74 kg 76.7 kg 72.8 kg    Intake/Output:    Intake/Output Summary (Last 24 hours) at 06/05/2023 1003 Last data filed at 06/05/2023 0600 Gross per 24 hour  Intake 352.71 ml  Output 3000 ml  Net -2647.29 ml     Physical Exam: General: Lying in the bed, no acute distress  HEENT Moist oral mucous membranes  Pulm/lungs Scattered rhonchi  CVS/Heart Irregular  Abdomen:  Mildly distended  Extremities: 1+ lower extremity edema  Neurologic: Alert, able to answer questions appropriately  Skin: Bruising over multiple areas  Access: Left upper extremity AV graft       Basic Metabolic Panel:  Recent Labs  Lab 05/30/23 0706 06/01/23 1819 06/01/23 2357 06/02/23 0213 06/02/23 1400 06/02/23 2150 06/03/23 0350 06/04/23 0428 06/05/23 0506  NA 124* 123*   < > 121* 123* 127* 127* 126* 129*  K 3.7 3.4*   < > 3.5 3.9 3.2* 3.5 4.4 4.8  CL 89* 87*   < > 86* 89* 92* 91* 92* 94*  CO2 23 23   < > 21* 20* 24 22 21* 21*  GLUCOSE 145* 186*   < > 206* 192* 206* 218* 133* 147*  BUN 60* 51*   < > 59* 68* 32* 40* 57* 74*  CREATININE 4.25* 3.60*   < > 3.81* 4.31* 2.27*  2.83* 3.82* 4.67*  CALCIUM 8.5* 8.0*   < > 7.4* 8.1* 8.0* 8.0* 8.7* 8.6*  MG 1.8  --   --  1.7  --   --  2.0 2.1  --   PHOS 3.9 3.3  --  4.8*  --   --  3.4 4.8* 6.4*   < > = values in this interval not displayed.     CBC: Recent Labs  Lab 06/01/23 1819 06/02/23 0213 06/03/23 0350 06/04/23 0428 06/05/23 0506  WBC 3.3* 7.2 5.2 9.2 11.4*  NEUTROABS 2.4  --   --   --   --   HGB 10.4* 9.7* 8.4* 9.0* 8.8*  HCT 30.2* 27.9* 24.1* 25.7* 25.4*  MCV 99.7 98.2 98.4 96.6 97.7  PLT 97* 100* 86* 79* 119*      Lab Results  Component Value Date   HEPBSAG NON REACTIVE 05/20/2023      Microbiology:  Recent Results (from the past 240 hour(s))  Culture, blood (x 2)     Status: None (Preliminary result)   Collection Time: 06/01/23  6:22 PM   Specimen: BLOOD  Result Value Ref Range Status   Specimen Description BLOOD BLOOD LEFT ARM  Final   Special Requests  Final    BOTTLES DRAWN AEROBIC AND ANAEROBIC Blood Culture adequate volume   Culture   Final    NO GROWTH 4 DAYS Performed at [facility], [address] [place], [place] [address]    Report Status PENDING  Incomplete  Culture, blood (x 2)     Status: Abnormal   Collection Time: 06/01/23  6:22 PM   Specimen: BLOOD  Result Value Ref Range Status   Specimen Description   Final    BLOOD NECK Performed at [facility], [address], [place], [place] [address]    Special Requests   Final    BOTTLES DRAWN AEROBIC AND ANAEROBIC Blood Culture adequate volume Performed at [facility], [address] [place], [place] [address]    Culture  Setup Time   Final    GRAM POSITIVE COCCI IN BOTH AEROBIC AND ANAEROBIC BOTTLES CRITICAL RESULT CALLED TO, READ BACK BY AND VERIFIED WITH: ANDREA DOBBS ON 06/02/23 AT 1126 QSD    Culture (A)  Final    STAPHYLOCOCCUS WARNERI THE SIGNIFICANCE OF ISOLATING THIS ORGANISM FROM A SINGLE SET OF BLOOD CULTURES WHEN MULTIPLE SETS ARE DRAWN IS UNCERTAIN. PLEASE NOTIFY  THE MICROBIOLOGY DEPARTMENT WITHIN ONE WEEK IF SPECIATION AND SENSITIVITIES ARE REQUIRED. Performed at [facility], [address], [place], [place] [address]    Report Status 06/04/2023 FINAL  Final  Blood Culture ID Panel (Reflexed)     Status: Abnormal   Collection Time: 06/01/23  6:22 PM  Result Value Ref Range Status   Enterococcus faecalis NOT DETECTED NOT DETECTED Final   Enterococcus Faecium NOT DETECTED NOT DETECTED Final   Listeria monocytogenes NOT DETECTED NOT DETECTED Final   Staphylococcus species DETECTED (A) NOT DETECTED Final    Comment: CRITICAL RESULT CALLED TO, READ BACK BY AND VERIFIED WITH: ANDREA DOBBS ON 06/02/23 AT 1126 QSD    Staphylococcus aureus (BCID) NOT DETECTED NOT DETECTED Final   Staphylococcus epidermidis NOT DETECTED NOT DETECTED Final   Staphylococcus lugdunensis NOT DETECTED NOT DETECTED Final   Streptococcus species NOT DETECTED NOT DETECTED Final   Streptococcus agalactiae NOT DETECTED NOT DETECTED Final   Streptococcus pneumoniae NOT DETECTED NOT DETECTED Final   Streptococcus pyogenes NOT DETECTED NOT DETECTED Final   A.calcoaceticus-baumannii NOT DETECTED NOT DETECTED Final   Bacteroides fragilis NOT DETECTED NOT DETECTED Final   Enterobacterales NOT DETECTED NOT DETECTED Final   Enterobacter cloacae complex NOT DETECTED NOT DETECTED Final   Escherichia coli NOT DETECTED NOT DETECTED Final   Klebsiella aerogenes NOT DETECTED NOT DETECTED Final   Klebsiella oxytoca NOT DETECTED NOT DETECTED Final   Klebsiella pneumoniae NOT DETECTED NOT DETECTED Final   Proteus species NOT DETECTED NOT DETECTED Final   Salmonella species NOT DETECTED NOT DETECTED Final   Serratia marcescens NOT DETECTED NOT DETECTED Final   Haemophilus influenzae NOT DETECTED NOT DETECTED Final   Neisseria meningitidis NOT DETECTED NOT DETECTED Final   Pseudomonas aeruginosa NOT DETECTED NOT DETECTED Final   Stenotrophomonas maltophilia NOT DETECTED NOT DETECTED  Final   Candida albicans NOT DETECTED NOT DETECTED Final   Candida auris NOT DETECTED NOT DETECTED Final   Candida glabrata NOT DETECTED NOT DETECTED Final   Candida krusei NOT DETECTED NOT DETECTED Final   Candida parapsilosis NOT DETECTED NOT DETECTED Final   Candida tropicalis NOT DETECTED NOT DETECTED Final   Cryptococcus neoformans/gattii NOT DETECTED NOT DETECTED Final    Comment: Performed at [facility], [address] [place], [place]  [address]  Resp panel by RT-PCR (RSV, Flu A&B, Covid) Anterior Nasal Swab     Status: None   Collection Time: 06/01/23  7:14 PM   Specimen: Anterior Nasal Swab  Result Value Ref Range Status   SARS Coronavirus 2 by RT PCR NEGATIVE NEGATIVE Final    Comment: (NOTE) SARS-CoV-2 target nucleic acids are NOT DETECTED.  The SARS-CoV-2 RNA is generally detectable in upper respiratory specimens during the acute phase of infection. The lowest concentration of SARS-CoV-2 viral copies this assay can detect is 138 copies/mL. A negative result does not preclude SARS-Cov-2 infection and should not be used as the sole basis for treatment or other patient management decisions. A negative result may occur with  improper specimen collection/handling, submission of specimen other than nasopharyngeal swab, presence of viral mutation(s) within the areas targeted by this assay, and inadequate number of viral copies(<138 copies/mL). A negative result must be combined with clinical observations, patient history, and epidemiological information. The expected result is Negative.  Fact Sheet for Patients:  BloggerCourse.com  Fact Sheet for Healthcare Providers:  SeriousBroker.it  This test is no t yet approved or cleared by the [place] FDA and  has been authorized for detection and/or diagnosis of SARS-CoV-2 by FDA under an Emergency Use Authorization (EUA). This EUA will remain  in effect (meaning  this test can be used) for the duration of the COVID-19 declaration under Section 564(b)(1) of the Act, 21 U.S.C.section 360bbb-3(b)(1), unless the authorization is terminated  or revoked sooner.       Influenza A by PCR NEGATIVE NEGATIVE Final   Influenza B by PCR NEGATIVE NEGATIVE Final    Comment: (NOTE) The Xpert Xpress SARS-CoV-2/FLU/RSV plus assay is intended as an aid in the diagnosis of influenza from Nasopharyngeal swab specimens and should not be used as a sole basis for treatment. Nasal washings and aspirates are unacceptable for Xpert Xpress SARS-CoV-2/FLU/RSV testing.  Fact Sheet for Patients: BloggerCourse.com  Fact Sheet for Healthcare Providers: SeriousBroker.it  This test is not yet approved or cleared by the [place] FDA and has been authorized for detection and/or diagnosis of SARS-CoV-2 by FDA under an Emergency Use Authorization (EUA). This EUA will remain in effect (meaning this test can be used) for the duration of the COVID-19 declaration under Section 564(b)(1) of the Act, 21 U.S.C. section 360bbb-3(b)(1), unless the authorization is terminated or revoked.     Resp Syncytial Virus by PCR NEGATIVE NEGATIVE Final    Comment: (NOTE) Fact Sheet for Patients: BloggerCourse.com  Fact Sheet for Healthcare Providers: SeriousBroker.it  This test is not yet approved or cleared by the [place] FDA and has been authorized for detection and/or diagnosis of SARS-CoV-2 by FDA under an Emergency Use Authorization (EUA). This EUA will remain in effect (meaning this test can be used) for the duration of the COVID-19 declaration under Section 564(b)(1) of the Act, 21 U.S.C. section 360bbb-3(b)(1), unless the authorization is terminated or revoked.  Performed at [facility], [address] [place], [place] [address]   MRSA Next Gen by PCR,  Nasal     Status: None   Collection Time: 06/02/23  2:11 AM   Specimen: Nasal Mucosa; Nasal Swab  Result Value Ref Range Status   MRSA by PCR Next Gen NOT DETECTED NOT DETECTED Final    Comment: (NOTE) The GeneXpert MRSA Assay (FDA approved for NASAL specimens only), is one component of a comprehensive MRSA colonization surveillance program. It is not intended to diagnose MRSA infection  nor to guide or monitor treatment for MRSA infections. Test performance is not FDA approved in patients less than 49 years old. Performed at [facility], [address] [place], [place] [address]   Culture, blood (Routine X 2) w Reflex to ID Panel     Status: None (Preliminary result)   Collection Time: 06/02/23  1:13 PM   Specimen: BLOOD  Result Value Ref Range Status   Specimen Description BLOOD BLOOD RIGHT HAND  Final   Special Requests   Final    BOTTLES DRAWN AEROBIC ONLY Blood Culture adequate volume   Culture   Final    NO GROWTH 3 DAYS Performed at [facility], [address], [place], [place] [address]    Report Status PENDING  Incomplete  Culture, blood (Routine X 2) w Reflex to ID Panel     Status: None (Preliminary result)   Collection Time: 06/02/23  2:15 PM   Specimen: BLOOD  Result Value Ref Range Status   Specimen Description BLOOD PICC LINE  Final   Special Requests   Final    BOTTLES DRAWN AEROBIC AND ANAEROBIC Blood Culture adequate volume   Culture   Final    NO GROWTH 3 DAYS Performed at [facility], [address], [place], [place] [address]    Report Status PENDING  Incomplete    Coagulation Studies: No results for input(s): "LABPROT", "INR" in the last 72 hours.   Urinalysis: No results for input(s): "COLORURINE", "LABSPEC", "PHURINE", "GLUCOSEU", "HGBUR", "BILIRUBINUR", "KETONESUR", "PROTEINUR", "UROBILINOGEN", "NITRITE", "LEUKOCYTESUR" in the last 72 hours.  Invalid input(s): "APPERANCEUR"    Imaging: PERIPHERAL VASCULAR  CATHETERIZATION  Result Date: 06/04/2023 See surgical note for result.  ECHOCARDIOGRAM COMPLETE  Result Date: 06/03/2023    ECHOCARDIOGRAM REPORT   Patient Name:   Robert Lucas Date of Exam: 06/03/2023 Medical Rec #:  [id number]       Height:       62.0 in Accession #:    [id number]      Weight:       163.1 lb Date of Birth:  1940/08/20      BSA:          1.753 m Patient Age:    82 years        BP:           144/59 mmHg Patient Gender: M               HR:           95 bpm. Exam Location:  [facility] Procedure: 2D Echo Indications:     Bacteremia R78.81  History:         Patient has prior history of Echocardiogram examinations.  Sonographer:     Elwin Sleight RDCS Referring Phys:  [id number] Janann Colonel Diagnosing Phys: Chilton Si MD  Sonographer Comments: Patient is obese. Image acquisition challenging due to patient body habitus and Image acquisition challenging due to respiratory motion. IMPRESSIONS  1. Left ventricular ejection fraction, by estimation, is 30 to 35%. The left ventricle has moderately decreased function. The left ventricle demonstrates global hypokinesis. There is mild concentric left ventricular hypertrophy. Left ventricular diastolic parameters are consistent with Grade II diastolic dysfunction (pseudonormalization). Elevated left ventricular end-diastolic pressure.  2. Right ventricular systolic function is normal. The right ventricular size is normal. There is moderately elevated pulmonary artery systolic pressure.  3. Left atrial size was severely dilated.  4. Right atrial size was severely dilated.  5. The mitral  valve is degenerative. No evidence of mitral valve regurgitation. No evidence of mitral stenosis.  6. Tricuspid valve regurgitation is moderate.  7. Left coronary cusp is essentially fixed. The aortic valve is tricuspid. There is mild calcification of the aortic valve. There is mild thickening of the aortic valve. Aortic valve regurgitation is not visualized. Mild aortic  valve stenosis. Aortic valve area, by VTI measures 1.54 cm. Aortic valve mean gradient measures 13.0 mmHg. Aortic valve Vmax measures 2.41 m/s.  8. The inferior vena cava is dilated in size with >50% respiratory variability, suggesting right atrial pressure of 8 mmHg. FINDINGS  Left Ventricle: Left ventricular ejection fraction, by estimation, is 30 to 35%. The left ventricle has moderately decreased function. The left ventricle demonstrates global hypokinesis. The left ventricular internal cavity size was normal in size. There is mild concentric left ventricular hypertrophy. Left ventricular diastolic parameters are consistent with Grade II diastolic dysfunction (pseudonormalization). Elevated left ventricular end-diastolic pressure. Right Ventricle: The right ventricular size is normal. No increase in right ventricular wall thickness. Right ventricular systolic function is normal. There is moderately elevated pulmonary artery systolic pressure. The tricuspid regurgitant velocity is 3.56 m/s, and with an assumed right atrial pressure of 8 mmHg, the estimated right ventricular systolic pressure is 58.7 mmHg. Left Atrium: Left atrial size was severely dilated. Right Atrium: Right atrial size was severely dilated. Pericardium: There is no evidence of pericardial effusion. Mitral Valve: The mitral valve is degenerative in appearance. Mild mitral annular calcification. No evidence of mitral valve regurgitation. No evidence of mitral valve stenosis. Tricuspid Valve: The tricuspid valve is normal in structure. Tricuspid valve regurgitation is moderate . No evidence of tricuspid stenosis. Aortic Valve: Left coronary cusp is essentially fixed. The aortic valve is tricuspid. There is mild calcification of the aortic valve. There is mild thickening of the aortic valve. Aortic valve regurgitation is not visualized. Mild aortic stenosis is present. Aortic valve mean gradient measures 13.0 mmHg. Aortic valve peak gradient  measures 23.2 mmHg. Aortic valve area, by VTI measures 1.54 cm. Pulmonic Valve: The pulmonic valve was normal in structure. Pulmonic valve regurgitation is trivial. No evidence of pulmonic stenosis. Aorta: The aortic root is normal in size and structure. Venous: The inferior vena cava is dilated in size with greater than 50% respiratory variability, suggesting right atrial pressure of 8 mmHg. IAS/Shunts: No atrial level shunt detected by color flow Doppler.  LEFT VENTRICLE PLAX 2D LVIDd:         5.10 cm      Diastology LVIDs:         4.30 cm      LV e' medial:    5.63 cm/s LV PW:         1.10 cm      LV E/e' medial:  23.6 LV IVS:        1.20 cm      LV e' lateral:   12.00 cm/s LVOT diam:     2.30 cm      LV E/e' lateral: 11.1 LV SV:         67 LV SV Index:   38 LVOT Area:     4.15 cm  LV Volumes (MOD) LV vol d, MOD A2C: 118.0 ml LV vol d, MOD A4C: 146.0 ml LV vol s, MOD A2C: 73.2 ml LV vol s, MOD A4C: 93.3 ml LV SV MOD A2C:     44.8 ml LV SV MOD A4C:     146.0 ml LV SV MOD  BP:      48.9 ml RIGHT VENTRICLE RV Basal diam:  4.10 cm RV S prime:     12.30 cm/s TAPSE (M-mode): 1.9 cm LEFT ATRIUM             Index        RIGHT ATRIUM           Index LA diam:        4.80 cm 2.74 cm/m   RA Area:     24.70 cm LA Vol (A2C):   99.1 ml 56.53 ml/m  RA Volume:   95.10 ml  54.25 ml/m LA Vol (A4C):   72.0 ml 41.07 ml/m LA Biplane Vol: 82.5 ml 47.06 ml/m  AORTIC VALVE                     PULMONIC VALVE AV Area (Vmax):    1.43 cm      PV Vmax:        1.22 m/s AV Area (Vmean):   1.51 cm      PV Peak grad:   6.0 mmHg AV Area (VTI):     1.54 cm      RVOT Peak grad: 1 mmHg AV Vmax:           241.00 cm/s AV Vmean:          154.500 cm/s AV VTI:            0.435 m AV Peak Grad:      23.2 mmHg AV Mean Grad:      13.0 mmHg LVOT Vmax:         82.70 cm/s LVOT Vmean:        56.200 cm/s LVOT VTI:          0.161 m LVOT/AV VTI ratio: 0.37  AORTA Ao Root diam: 3.40 cm Ao Asc diam:  3.40 cm MITRAL VALVE                TRICUSPID VALVE MV  Area (PHT): 4.29 cm     TR Peak grad:   50.7 mmHg MV Decel Time: 177 msec     TR Vmax:        356.00 cm/s MV E velocity: 133.00 cm/s MV A velocity: 95.90 cm/s   SHUNTS MV E/A ratio:  1.39         Systemic VTI:  0.16 m                             Systemic Diam: 2.30 cm Chilton Si MD Electronically signed by Chilton Si MD Signature Date/Time: 06/03/2023/4:20:43 PM    Final      Medications:    albumin human     ceFEPime (MAXIPIME) IV Stopped (06/04/23 2259)   heparin 600 Units/hr (06/05/23 0600)   vancomycin Stopped (06/04/23 1342)    allopurinol  100 mg Oral Daily   amiodarone  200 mg Oral Daily   atorvastatin  40 mg Oral QHS   Chlorhexidine Gluconate Cloth  6 each Topical Daily   epoetin alfa-epbx (RETACRIT) injection  4,000 Units Subcutaneous Q M,W,F-HD   insulin aspart  0-15 Units Subcutaneous Q4H   ipratropium-albuterol  3 mL Nebulization Q6H   midodrine  10 mg Oral TID WC   mouth rinse  15 mL Mouth Rinse 4 times per day   pantoprazole (PROTONIX) IV  40 mg Intravenous Daily   sodium chloride flush  10-40 mL Intracatheter Q12H  sodium chloride flush  3 mL Intravenous Q12H   sodium chloride  1 g Oral BID WC   acetaminophen, acetaminophen, dextromethorphan-guaiFENesin, heparin, ondansetron (ZOFRAN) IV, mouth rinse, polyethylene glycol, sodium chloride flush, traMADol  Assessment/ Plan:  82 y.o. male with  medical problems of  sCHF with EF< 20%, A fib on Eliquis, HTN, HLD, DM, CAD with history of CABG, gout, BPH, anemia, Bell's palsy, obesity, OSA not using CPAP, former smoker, chronic hyponatremia,   admitted on 06/01/2023 for Acute encephalopathy [G93.40] HCAP (healthcare-associated pneumonia) [J18.9] Pneumonia of right lung due to infectious organism, unspecified part of lung [J18.9]   Outpatient dialysis--[facility], Alanson Puls, MD Mircera 50 mcg, Venofer Target weight 68 kg   # ESRD with volume overload. Patient underwent hemodialysis  treatment yesterday.  UF achieved was 3 kg.  Next dialysis treatment scheduled for tomorrow.   # Hyponatremia Sodium currently 129.  Continue to monitor closely.   #Anemia in CKD Receives Mircera and IV Venofer outpatient. Hemoglobin down slightly this a.m. to 8.8.  Maintain the patient on low-dose Epogen.   # Thrombocytopenia Known at least since April 2023.  Platelets range between 59-140.  #Left upper extremity cellulitis Staphylococcus bacteremia. Patient was evaluated by vascular surgeon on 11/30. Ultrasound of the graft did not show any obvious fluid collection. No indication for removal of AV graft.    LOS: 4 Tashawnda Bleiler 12/3/202410:03 AM  [facility] [place], [place] [phone]  Note: This note was prepared with Dragon dictation. Any transcription errors are unintentional

## 2023-06-05 NOTE — Progress Notes (Signed)
  Progress Note    06/05/2023 10:40 AM 1 Day Post-Op  Subjective:  Robert Lucas is now POD #1 from left arm shuntogram with angioplasty of the left subclavian vein and stent placement to left mid graft to treat pseudoaneurysms.  Ultrasounds of the graft did not show any obvious fluid collection therefore no indication to remove AV graft at this time. Patient is weak this morning with confusion post dialysis. Unable to communicate as he mumbles and does not follow commands. Left arm remains with edema +4 and weeping. Patient remains on heparin infusion. No other complaints and vitals all remain stable.    Vitals:   06/05/23 0500 06/05/23 0600  BP: (!) 116/50 121/77  Pulse: 86 87  Resp: 15 (!) 24  Temp:    SpO2: 100% 99%   Physical Exam: Cardiac:  RRR, normal S1, S2, no murmur Lungs:  diffuse coarse breath sounds and wheezes, normal respiratory effort. On 4 L/min [place] O2  Incisions:  Left upper arm with dressing clean dry and intact.  Extremities:  LUE edematous and erythematous, BLE also erythema but no differential warmth, LUE fistula present with clean dry intact gauze dressing. Unable to palpate lower extremity pulses due to edema. Positive strong pulse in shunt  Abdomen:  Obese, soft, non tender and non distended.  Neurologic: somonlent, responds to voice, no gross focal neurologic deficits, unable to follow commands.   CBC    Component Value Date/Time   WBC 11.4 (H) 06/05/2023 0506   RBC 2.60 (L) 06/05/2023 0506   HGB 8.8 (L) 06/05/2023 0506   HCT 25.4 (L) 06/05/2023 0506   PLT 119 (L) 06/05/2023 0506   MCV 97.7 06/05/2023 0506   MCH 33.8 06/05/2023 0506   MCHC 34.6 06/05/2023 0506   RDW 17.9 (H) 06/05/2023 0506   LYMPHSABS 0.6 (L) 06/01/2023 1819   MONOABS 0.3 06/01/2023 1819   EOSABS 0.0 06/01/2023 1819   BASOSABS 0.0 06/01/2023 1819    BMET    Component Value Date/Time   NA 129 (L) 06/05/2023 0506   K 4.8 06/05/2023 0506   CL 94 (L) 06/05/2023 0506   CO2 21  (L) 06/05/2023 0506   GLUCOSE 147 (H) 06/05/2023 0506   BUN 74 (H) 06/05/2023 0506   CREATININE 4.67 (H) 06/05/2023 0506   CALCIUM 8.6 (L) 06/05/2023 0506   CALCIUM 7.6 (L) 08/26/2021 0050   GFRNONAA 12 (L) 06/05/2023 0506   GFRAA 26 (L) 05/07/2019 0234    INR    Component Value Date/Time   INR 1.3 (H) 06/01/2023 1819     Intake/Output Summary (Last 24 hours) at 06/05/2023 1040 Last data filed at 06/05/2023 0600 Gross per 24 hour  Intake 352.71 ml  Output 3000 ml  Net -2647.29 ml     Assessment/Plan:  82 y.o. male is s/p Left upper extremity shuntogram with angioplasty and stent placement to treat pseudoaneurysms.  1 Day Post-Op   PLAN: Okay to convert IV Heparin back to patients oral anticoagulation eliquis 2.5 mg twice daily for his atrial fibrillation.  Okay to use fistula for dialysis.  Ultrasound of the graft did not show any obvious fluid collection therefore is no indication for removal of the graft and no intervention needed at this time.  DVT prophylaxis:  Heparin Infusion   Marcie [facility] 06/05/2023 10:40 AM

## 2023-06-05 NOTE — Progress Notes (Addendum)
Progress Note   Patient: Robert Lucas ZOX:[id number] DOB: 10-Aug-1940 DOA: 06/01/2023     4 DOS: the patient was seen and examined on 06/05/2023   Brief hospital course: HPI from admission 06/01/23: "82 year old male patient with a past medical history of CAD, hypertension, hyperlipidemia heart failure with reduced EF less than 20%, newly diagnosed A-fib status post cardioversion on amnio and Eliquis, OSA, type 2 diabetes mellitus and end-stage renal disease on HD MWF who presented to [facility] on 06/01/2023 from his SNF with altered mental status fevers and hypotension.   He was recently admitted to [facility] from 11/15 to 11/27 with new onset A-fib RVR and underwent TEE with cardioversion.  He was started on Eliquis amiodarone and midodrine.   He underwent dialysis on 11/29 and afterwards family noted he was confused altered nausea vomiting hypotensive and hypoxic requiring 4 L nasal cannula.  He was therefore brought into the ED and admitted to the ICU for pressor support.   He received a total of 1.5 L.  Labs without leukocytosis. LA 2.0  Chest x-ray with cardiac enlargement and mild perihilar infiltration progressing.  Blood cultures were drawn and he was started on broad-spectrum antibiotics with cefepime and vancomycin. "   See ICU H&P and progress notes for detailed course and management.  In summary, patient was admitted to ICU requiring vasopressors for septic shock from pneumonia versus possible AV graft infection.    12/2 -- Patient off pressors and TRH assumed care. Pt remains in stepdown for close monitoring due to persistent encephalopathy.  He remains on 4-6 L/min oxygen and intermittent bipap.   Assessment and Plan: * Severe sepsis with septic shock (CODE) (HCC) Shock resolved, off pressors 12/1 See HCAP, Bacteremia Follow cultures & continue antibiotics  Bacteremia Initial blood culture grew staph warneri Repeat cultures  negative to date --Consult Infectious Disease, appreciate antibiotic recommendations --Continue Vanc/Cefepime for now  HCAP (healthcare-associated pneumonia) Continue Cefepime, Vancomycin Mucinex Remains on oxygen Scheduled Duonebs Q6H  Acute respiratory failure with hypoxia (HCC) Due to pneumonia - see HCAP Remains on 4-6 L/min oxygen --Wean O2 as tolerated, goal spO2 > 90% --Pulmonary hygiene - IS, flutter --BiPAP as needed  Acute metabolic encephalopathy Due to septic shock, hypoxia, underlying renal failure. --Mgmt of underlying issues as outlined --Delirium precautions --Neuro checks --Monitor & replace electrolytes  Hypokalemia Resolved, K 4.4 Monitor & replace K PRN  ESRD on dialysis [facility]) Dialysis per Nephrology Vascular surgery plans for fistulogram this afternoon - follow up findings  Pancytopenia (HCC) Resolved. Ongoing thrombocytopenia (plt 79k) Monitor on heparin Hbg 9.0 stalbe  Type II diabetes mellitus with renal manifestations (HCC) Sliding scale novolog  Atrial fibrillation, chronic (HCC) Currently on IV heparin HR's controlled Telemetry  Hyponatremia Chronic.  Na stable in upper 120's Monitor BMP's  Acute on chronic HFrEF (heart failure with reduced ejection fraction) (HCC) Echo 06/03/23 - EF 30-35%, grade II DD, moderately elevated PASP, mod TR, mild AS --Volume mgmt by dialysis --Monitor volume status, hemodyamics  Essential hypertension Currently soft BP's Continue midodrine  Obesity (BMI 30-39.9) Body mass index is 30.93 kg/m. Complicates overall care and prognosis.  Recommend lifestyle modifications including physical activity and diet for weight loss and overall long-term health.  Gout On allopurinol  Hyperlipidemia On Lipitor        Subjective: Pt seen in stepdown this AM with wife and daughter present.  He is still lethargic, will wake up to voice and stimulation but quickly falls back  to sleep.  He denies complaints.     Physical Exam: Vitals:   06/05/23 1000 06/05/23 1100 06/05/23 1200 06/05/23 1300  BP: (!) 110/54 (!) 107/51 (!) 120/55 (!) 111/52  Pulse: 87 81 93 87  Resp: 18 15 15 20   Temp:   98.2 F (36.8 C)   TempSrc:      SpO2: 94% 94% 94% 98%  Weight:      Height:       General exam: sleeping responds to voice, quickly falls back to sleep, no acute distress HEENT: nasal cannula in place, moist mucus membranes Respiratory system: diffuse rhonchi versus upper airway secretion sounds, normal respiratory effort. On 3 L/min [place] O2 Cardiovascular system: normal S1/S2, RRR, improved peripheral edema (except LUE which remains edematous), LUE fistula present.   Gastrointestinal system: soft, NT, ND Central nervous system: somonlent, responds to voice, no gross focal neurologic deficits, normal speech Extremities: LUE edematous with weeping and erythematous but no differential warmth, LUE fistula present with clean dry intact gauze dressing  Skin: dry, intact, normal temperature Psychiatry: limited exam due to somnolence / encephalopathy   Data Reviewed:  Notable labs -  Na 126>> 129 Cl 94, bicarb 21, glucose 147, BUN 74, Cr 4.67 phos 6.4  WBC 11.4 Hbg 8.8 Platelets 119k   Family Communication: Wife and daughter updated at bedside on rounds this AM  Disposition: Status is: Inpatient Remains inpatient appropriate because: severity of illness as above   Planned Discharge Destination: Skilled nursing facility    Time spent: 42 minutes  Author: Pennie Banter, DO 06/05/2023 5:03 PM  For on call review [email or web address].

## 2023-06-05 NOTE — Evaluation (Signed)
Clinical/Bedside Swallow Evaluation Patient Details  Name: Robert Lucas MRN: [id number] Date of Birth: 03-Dec-1940  Today's Date: 06/05/2023 Time: SLP Start Time (ACUTE ONLY): 1330 SLP Stop Time (ACUTE ONLY): 1410 SLP Time Calculation (min) (ACUTE ONLY): 40 min  Past Medical History:  Past Medical History:  Diagnosis Date   Arthritis    CHF (congestive heart failure) (HCC)    HFrEF   CKD (chronic kidney disease)    sees [place] in [place]   Coronary artery disease    Diabetes (HCC)    type 2   sees endo at [place] in [place]   ESRD (end stage renal disease) on dialysis [facility])    M,W,F   Hypercholesteremia    Hypertension    Myocardial infarction (HCC) 1990   Neuromuscular disorder (HCC)    Pneumonia    PONV (postoperative nausea and vomiting)    Past Surgical History:  Past Surgical History:  Procedure Laterality Date   A/V FISTULAGRAM Left 06/04/2023   Procedure: A/V Fistulagram;  Surgeon: Annice Needy, MD;  Location: [facility] INVASIVE CV LAB;  Service: Cardiovascular;  Laterality: Left;   AV FISTULA PLACEMENT Left 05/17/2021   Procedure: LEFT ARM ARTERIOVENOUS (AV) FISTULA CREATION;  Surgeon: Victorino Sparrow, MD;  Location: [facility] OR;  Service: Vascular;  Laterality: Left;  PERIPHERAL NERVE BLOCK   AV FISTULA PLACEMENT Left 05/02/2022   Procedure: INSERTION OF LEFT ARTERIOVENOUS (AV) GORE-TEX GRAFT;  Surgeon: Victorino Sparrow, MD;  Location: [facility] OR;  Service: Vascular;  Laterality: Left;   BACK SURGERY  yrs ago   lower   CARDIAC CATHETERIZATION     CARDIOVERSION N/A 05/24/2023   Procedure: CARDIOVERSION (CATH LAB);  Surgeon: Jake Bathe, MD;  Location: [facility] INVASIVE CV LAB;  Service: Cardiovascular;  Laterality: N/A;   CATARACT EXTRACTION Right 01/2021   CHOLECYSTECTOMY N/A 05/02/2017   Procedure: LAPAROSCOPIC CHOLECYSTECTOMY;  Surgeon: Axel Filler, MD;  Location: [facility] OR;  Service: General;  Laterality: N/A;   CORONARY ANGIOPLASTY     2 1990   EYE SURGERY Left 03/2021    cataract removal   IR FLUORO GUIDE CV LINE RIGHT  10/15/2021   IR US GUIDE VASC ACCESS RIGHT  10/15/2021   KNEE SURGERY Left yrs ago   arthroscopic   LIGATION OF COMPETING BRANCHES OF ARTERIOVENOUS FISTULA Left 11/22/2021   Procedure: LEFT ARM FISTULA BRANCH LIGATION;  Surgeon: Victorino Sparrow, MD;  Location[facility] OR;  Service: Vascular;  Laterality: Left;   PERIPHERAL VASCULAR BALLOON ANGIOPLASTY  10/19/2021   Procedure: PERIPHERAL VASCULAR BALLOON ANGIOPLASTY;  Surgeon: Victorino Sparrow, MD;  Location: [facility] INVASIVE CV LAB;  Service: Cardiovascular;;  Left AVF   ROTATOR CUFF REPAIR Left yrs ago   SHOULDER OPEN ROTATOR CUFF REPAIR Right 04/23/2013   Procedure: RIGHT SHOULDER ROTATOR CUFF REPAIR WITH GRAFT AND ANCHORS ;  Surgeon: Jacki Cones, MD;  Location: [facility] ORS;  Service: Orthopedics;  Laterality: Right;   TONSILLECTOMY     TOTAL HIP ARTHROPLASTY Right 05/06/2019   Procedure: TOTAL HIP ARTHROPLASTY ANTERIOR APPROACH;  Surgeon: Durene Romans, MD;  Location: [facility] ORS;  Service: Orthopedics;  Laterality: Right;  70 mins   TRANSESOPHAGEAL ECHOCARDIOGRAM (CATH LAB) N/A 05/24/2023   Procedure: TRANSESOPHAGEAL ECHOCARDIOGRAM;  Surgeon: Jake Bathe, MD;  Location: [facility] INVASIVE CV LAB;  Service: Cardiovascular;  Laterality: N/A;   HPI:  Pt is an 82 year old male patient with a past medical history of CAD, hypertension, hyperlipidemia heart failure with reduced EF less than 20%, newly diagnosed A-fib  status post cardioversion on amnio and Eliquis, OSA, type 2 diabetes mellitus and end-stage renal disease on HD MWF who presented to [facility] on 06/01/2023 from his SNF with altered mental status fevers and hypotension.     He was recently admitted to [facility] from 11/15 to 11/27 with new onset A-fib RVR and underwent TEE with cardioversion.  He was started on Eliquis amiodarone and midodrine.     He underwent dialysis on 11/29 and afterwards family noted he was  confused altered nausea vomiting hypotensive and hypoxic requiring 4 L nasal cannula.  He was therefore brought into the ED and admitted to the ICU for pressor support.     He received a total of 1.5 L.  Labs without leukocytosis. LA 2.0  Chest x-ray at admit with cardiac enlargement and mild perihilar infiltration progressing.  Chest x-ray today, 06/05/23, revealed: Enlarging right pleural effusion with increased right basilar  opacity, likely atelectasis.  2. Stable cardiomegaly and chronic vascular congestion.  3. Aortic atherosclerosis.   Head CT this admit: No acute intracranial abnormalities. Chronic atrophy and small  vessel ischemic changes.    Assessment / Plan / Recommendation  Clinical Impression   Pt seen for BSE today. Pt awake, verbal but w/ increased SOB/WOB for talking and only able to speak at the 1-2 words level. Rest Breaks encouraged to replenish breath support. Pt also seemed distracted w/ min decreased Cognitive awareness, exhibited during the oral phase of swallowing especially. MOD+ cues and support required for po tasks and follow through. Noted weak, shaky UEs for self-feeding.  On [place] O2 support 3L; afebrile. WBC elevated.   Pt presents w/ increased risk for aspiration/aspiration pneumonia w/ oral intake in setting of  poor/declined Pulmonary status. Pt also requires significant support w/ feeding and cues to attend to po trials -- dependent w/ care currently per notes. And, frequent REST BREAKS required during any/all tasks including po intake d/t WOB/SOB secondary to any exertion.    Pt presents w/ oropharyngeal phase dysphagia and risk for aspiration secondary to apparent impact from his current Pulmonary status/decline, w/ need for increased O2 support and frequent REST BREAKS w/ any exertion/task to ease respiratory demand and calm RR. ANY such significant Pulmonary decline can impact Apnea timing and airway closure during the swallow which can impact pharyngeal swallowing,  airway protection, and increase risk for aspiration to occur thus further Pulmonary impact/decline.  NSG has reported in chart that it was felt pt was coughing w/ liquids; he has been on a Regular diet w/ thin liquids since admit.    During this evaluation, trials of ice chips, 2 trials of water Via pinched straw, then tsp/straw sips of Nectar liquids followed by purees/minced softened solids were presented w/ REST BREAKS b/t trials and careful monitoring of pt's RR(low-mid 20s). Overt, MOD coughing noted w/ 1/2 trials of thin liquids despite use of aspiration precautions. No immediate, overt clinical s/s of aspiration noted w/ remaining trials including Nectar liquids. Pt's respiratory effort appeared to increase w/ the effort from the exertion of the tasks but calmed again (to his baseline) w/ the REST BREAKS b/t trials. During the Oral phase, pt appeared to lose focus/attention w/ the bolus management thus resulting in prolonged A-P transfer time orally, min slower mastication w/ munching pattern noted. Oral clearing achieved w/ Time and alternating foods/liquids.  OM exam exam revealed no unilateral weakness; generalized weakness and decreased attention to OM task and oral care given.  Recommend a more Dysphagia level 2(Minced foods w/ added Purees) diet w/ moistened foods to reduce mastication effort/demand and Nectar liquids -- to reduce risk for aspiration and for conservation of energy. Pills given CRUSHED in Puree to lessen risk for aspiration. STRICT aspiration precautions w/ frequent REST BREAKS during oral intake w/ close monitoring of RR and increased mouth breathing. GERD/REFLUX precautions. Stop feeding if any increased coughing or s/s of aspiration noted. Alternate foods and liquids as needed. Oral care prior to po's recommended. ST services will continue to follow pt from afar and monitor status; can also be available for further education while admitted. No diet upgrade is recommended  while pt's Pulmonary status remains declined and Cognitive attention declined. A Palliative Care consult is recommended for GOC; Dietician f/u. MD/NSG updated. SLP Visit Diagnosis: Dysphagia, oropharyngeal phase (R13.12) (declined cognitive attention; MOD++ weakness)    Aspiration Risk  Moderate aspiration risk;Risk for inadequate nutrition/hydration    Diet Recommendation   Nectar;Dysphagia 2 (chopped) (added purees) = a more Dysphagia level 2(Minced foods w/ added Purees) diet w/ moistened foods to reduce mastication effort/demand and Nectar liquids -- to reduce risk for aspiration and for conservation of energy. STRICT aspiration precautions w/ frequent REST BREAKS during oral intake w/ close monitoring of RR and increased mouth breathing. GERD/REFLUX precautions. Stop feeding if any increased coughing or s/s of aspiration noted. Alternate foods and liquids as needed. Oral care prior to po's recommended.  Medication Administration: Crushed with puree    Other  Recommendations Recommended Consults:  (Dietician f/u; Palliative Care f/u) Oral Care Recommendations: Oral care BID;Oral care before and after PO;Staff/trained caregiver to provide oral care Caregiver Recommendations: Avoid jello, ice cream, thin soups, popsicles;Remove water pitcher;Have oral suction available    Recommendations for follow up therapy are one component of a multi-disciplinary discharge planning process, led by the attending physician.  Recommendations may be updated based on patient status, additional functional criteria and insurance authorization.  Follow up Recommendations Follow physician's recommendations for discharge plan and follow up therapies      Assistance Recommended at Discharge  FULL  Functional Status Assessment Patient has had a recent decline in their functional status and/or demonstrates limited ability to make significant improvements in function in a reasonable and predictable amount of time   Frequency and Duration min 2x/week  2 weeks       Prognosis Prognosis for improved oropharyngeal function: Guarded Barriers to Reach Goals: Cognitive deficits;Time post onset;Severity of deficits Barriers/Prognosis Comment: declined cognitive attention; MOD++ weakness      Swallow Study   General Date of Onset: 06/02/23 HPI: Pt is an 82 year old male patient with a past medical history of CAD, hypertension, hyperlipidemia heart failure with reduced EF less than 20%, newly diagnosed A-fib status post cardioversion on amnio and Eliquis, OSA, type 2 diabetes mellitus and end-stage renal disease on HD MWF who presented to [facility] on 06/01/2023 from his SNF with altered mental status fevers and hypotension.     He was recently admitted to [facility] from 11/15 to 11/27 with new onset A-fib RVR and underwent TEE with cardioversion.  He was started on Eliquis amiodarone and midodrine.     He underwent dialysis on 11/29 and afterwards family noted he was confused altered nausea vomiting hypotensive and hypoxic requiring 4 L nasal cannula.  He was therefore brought into the ED and admitted to the ICU for pressor support.     He received a total of 1.5 L.  Labs without leukocytosis. LA 2.0  Chest x-ray at admit with cardiac enlargement and mild perihilar infiltration progressing.  Chest x-ray today, 06/05/23, revealed: Enlarging right pleural effusion with increased right basilar  opacity, likely atelectasis.  2. Stable cardiomegaly and chronic vascular congestion.  3. Aortic atherosclerosis.   Head CT this admit: No acute intracranial abnormalities. Chronic atrophy and small  vessel ischemic changes. Type of Study: Bedside Swallow Evaluation Previous Swallow Assessment: none Diet Prior to this Study: Regular;Thin liquids (Level 0) Temperature Spikes Noted: No (wbc 11.4) Respiratory Status: Nasal cannula (3L) History of Recent Intubation:  No Behavior/Cognition: Alert;Cooperative;Pleasant mood;Distractible;Requires cueing (SOB/WOB) Oral Cavity Assessment: Dry (sticky) Oral Care Completed by SLP: Yes Oral Cavity - Dentition: Adequate natural dentition;Missing dentition (few) Vision:  (n/a) Self-Feeding Abilities: Total assist (weak UEs) Patient Positioning: Upright in bed (full positioning support required) Baseline Vocal Quality: Low vocal intensity (1-2 words d/t SOB) Volitional Cough: Congested Volitional Swallow: Able to elicit    Oral/Motor/Sensory Function Overall Oral Motor/Sensory Function: Within functional limits   Ice Chips Ice chips: Within functional limits Presentation: Spoon (fed; 2 trials)   Thin Liquid Thin Liquid: Impaired Presentation: Straw (fed; pinched straw x2) Oral Phase Impairments: Poor awareness of bolus Oral Phase Functional Implications:  (anterior leakage) Pharyngeal  Phase Impairments: Suspected delayed Swallow;Cough - Immediate (x1/2 trials) Other Comments: MOD+ coughing    Nectar Thick Nectar Thick Liquid: Impaired Presentation: Spoon;Straw (pinched; 2 tsps and 6 via straw) Oral Phase Impairments: Poor awareness of bolus Oral phase functional implications: Prolonged oral transit (intermittent) Pharyngeal Phase Impairments:  (no immediate, overt s/s)   Honey Thick Honey Thick Liquid: Not tested   Puree Puree: Impaired Presentation: Spoon (fed; 8 trials) Oral Phase Impairments: Poor awareness of bolus Oral Phase Functional Implications: Prolonged oral transit (intermittent) Pharyngeal Phase Impairments:  (no immediate, overt s/s)   Solid     Solid: Impaired Presentation: Spoon (fed; 3 trials) Oral Phase Impairments: Impaired mastication;Poor awareness of bolus Oral Phase Functional Implications: Prolonged oral transit;Impaired mastication Pharyngeal Phase Impairments:  (no immediate, overt s/s)        Jerilynn Som, MS, CCC-SLP Speech Language Pathologist Rehab Services;  [facility] [phone] (ascom) Markelle Najarian 06/05/2023,4:39 PM

## 2023-06-05 NOTE — Progress Notes (Signed)
Physical Therapy Treatment Patient Details Name: TAMARIS SUVER MRN: [id number] DOB: 1941-06-25 Today's Date: 06/05/2023   History of Present Illness Pt is an 82 year old male presenting to [facility] from SNF with AMS and hypotension, admitted with septic shock,likely from LUE cellulitis; metabolic encephalopathy     PMH significant for CAD, hypertension, hyperlipidemia heart failure with reduced EF less than 20%, newly diagnosed A-fib status post cardioversion on amnio and Eliquis, OSA, type 2 diabetes mellitus and end-stage renal disease on HD MWF    PT Comments  Patient is lethargic throughout session and drifts off to sleep when not stimulated. Total assistance required for repositioning in bed. Further mobility efforts deferred due to poor participation and lethargy. Patient did participate with LE exercises for strengthening. Recommend to continue PT to maximize independence and decrease caregiver burden. Rehabilitation <3 hours/day recommended after this hospital stay.    If plan is discharge home, recommend the following: Two people to help with walking and/or transfers;Two people to help with bathing/dressing/bathroom;Assistance with cooking/housework;Assistance with feeding;Direct supervision/assist for medications management;Direct supervision/assist for financial management;Assist for transportation;Help with stairs or ramp for entrance;Supervision due to cognitive status   Can travel by private vehicle     No  Equipment Recommendations  None recommended by PT    Recommendations for Other Services       Precautions / Restrictions Precautions Precautions: Fall Restrictions Weight Bearing Restrictions: No     Mobility  Bed Mobility Overal bed mobility: Needs Assistance Bed Mobility: Rolling Rolling: Total assist         General bed mobility comments: total assistance for rolling to the right side for repositioning for skin integrity. further moblity not attempted due to  lethargy and poor participation    Transfers                        Ambulation/Gait                   Stairs             Wheelchair Mobility     Tilt Bed    Modified Rankin (Stroke Patients Only)       Balance                                            Cognition Arousal: Lethargic Behavior During Therapy: Flat affect Overall Cognitive Status: No family/caregiver present to determine baseline cognitive functioning Area of Impairment: Orientation, Attention, Memory, Safety/judgement, Following commands, Awareness                 Orientation Level: Disoriented to, Time, Situation   Memory: Decreased recall of precautions, Decreased short-term memory Following Commands: Follows one step commands inconsistently Safety/Judgement: Decreased awareness of deficits   Problem Solving: Slow processing, Decreased initiation, Requires verbal cues, Requires tactile cues General Comments: lethargic and drifting off to sleep when not stimulated        Exercises General Exercises - Lower Extremity Ankle Circles/Pumps: AAROM, Strengthening, Both, 10 reps, Supine Short Arc Quad: AAROM, Strengthening, Both, 10 reps, Supine Heel Slides: AAROM, Strengthening, Left, 10 reps, Supine Hip ABduction/ADduction: AAROM, Strengthening, Both, 10 reps, Supine Other Exercises Other Exercises: verbal cues for technique, participation    General Comments        Pertinent Vitals/Pain Pain Assessment Pain Assessment: Faces Faces Pain Scale: Hurts little more  Pain Location: LUE Pain Descriptors / Indicators: Discomfort Pain Intervention(s): Limited activity within patient's tolerance, Monitored during session, Repositioned    Home Living                          Prior Function            PT Goals (current goals can now be found in the care plan section) Acute Rehab PT Goals Patient Stated Goal: to get stronger PT Goal  Formulation: With patient Time For Goal Achievement: 06/18/23 Potential to Achieve Goals: Fair Progress towards PT goals: Progressing toward goals    Frequency    Min 1X/week      PT Plan      Co-evaluation              AM-PAC PT "6 Clicks" Mobility   Outcome Measure  Help needed turning from your back to your side while in a flat bed without using bedrails?: Total Help needed moving from lying on your back to sitting on the side of a flat bed without using bedrails?: Total Help needed moving to and from a bed to a chair (including a wheelchair)?: Total Help needed standing up from a chair using your arms (e.g., wheelchair or bedside chair)?: Total Help needed to walk in hospital room?: Total Help needed climbing 3-5 steps with a railing? : Total 6 Click Score: 6    End of Session Equipment Utilized During Treatment: Oxygen Activity Tolerance: Patient limited by lethargy;Patient limited by fatigue Patient left: in bed;with call bell/phone within reach;with bed alarm set Nurse Communication: Mobility status PT Visit Diagnosis: Unsteadiness on feet (R26.81);Muscle weakness (generalized) (M62.81)     Time: 4098-1191 PT Time Calculation (min) (ACUTE ONLY): 12 min  Charges:    $Therapeutic Exercise: 8-22 mins PT General Charges $$ ACUTE PT VISIT: 1 Visit                    Donna Bernard, PT, MPT    Ina Homes 06/05/2023, 2:47 PM

## 2023-06-05 NOTE — Assessment & Plan Note (Signed)
Initial blood culture grew staph warneri Repeat cultures negative to date --Consult Infectious Disease, appreciate antibiotic recommendations --Continue Vanc/Cefepime for now

## 2023-06-06 ENCOUNTER — Ambulatory Visit: Payer: Medicare Other | Admitting: Cardiology

## 2023-06-06 DIAGNOSIS — R7881 Bacteremia: Secondary | ICD-10-CM | POA: Diagnosis not present

## 2023-06-06 DIAGNOSIS — T82590A Other mechanical complication of surgically created arteriovenous fistula, initial encounter: Secondary | ICD-10-CM | POA: Diagnosis not present

## 2023-06-06 DIAGNOSIS — E871 Hypo-osmolality and hyponatremia: Secondary | ICD-10-CM

## 2023-06-06 DIAGNOSIS — G9340 Encephalopathy, unspecified: Secondary | ICD-10-CM | POA: Diagnosis not present

## 2023-06-06 DIAGNOSIS — R6521 Severe sepsis with septic shock: Secondary | ICD-10-CM | POA: Diagnosis not present

## 2023-06-06 DIAGNOSIS — I729 Aneurysm of unspecified site: Secondary | ICD-10-CM | POA: Diagnosis not present

## 2023-06-06 LAB — COMPREHENSIVE METABOLIC PANEL
ALT: 24 U/L (ref 0–44)
AST: 23 U/L (ref 15–41)
Albumin: 2.2 g/dL — ABNORMAL LOW (ref 3.5–5.0)
Alkaline Phosphatase: 152 U/L — ABNORMAL HIGH (ref 38–126)
Anion gap: 12 (ref 5–15)
BUN: 96 mg/dL — ABNORMAL HIGH (ref 8–23)
CO2: 22 mmol/L (ref 22–32)
Calcium: 8 mg/dL — ABNORMAL LOW (ref 8.9–10.3)
Chloride: 95 mmol/L — ABNORMAL LOW (ref 98–111)
Creatinine, Ser: 5.47 mg/dL — ABNORMAL HIGH (ref 0.61–1.24)
GFR, Estimated: 10 mL/min — ABNORMAL LOW (ref >60)
Glucose, Bld: 73 mg/dL (ref 70–99)
Potassium: 4.9 mmol/L (ref 3.5–5.1)
Sodium: 129 mmol/L — ABNORMAL LOW (ref 135–145)
Total Bilirubin: 1.2 mg/dL — ABNORMAL HIGH (ref <1.2)
Total Protein: 5.5 g/dL — ABNORMAL LOW (ref 6.5–8.1)

## 2023-06-06 LAB — CBC
HCT: 24 % — ABNORMAL LOW (ref 39.0–52.0)
Hemoglobin: 8.2 g/dL — ABNORMAL LOW (ref 13.0–17.0)
MCH: 33.9 pg (ref 26.0–34.0)
MCHC: 34.2 g/dL (ref 30.0–36.0)
MCV: 99.2 fL (ref 80.0–100.0)
Platelets: 114 10*3/uL — ABNORMAL LOW (ref 150–400)
RBC: 2.42 MIL/uL — ABNORMAL LOW (ref 4.22–5.81)
RDW: 18.2 % — ABNORMAL HIGH (ref 11.5–15.5)
WBC: 10.5 10*3/uL (ref 4.0–10.5)
nRBC: 0.3 % — ABNORMAL HIGH (ref 0.0–0.2)

## 2023-06-06 LAB — GLUCOSE, CAPILLARY
Glucose-Capillary: 83 mg/dL (ref 70–99)
Glucose-Capillary: 77 mg/dL (ref 70–99)
Glucose-Capillary: 177 mg/dL — ABNORMAL HIGH (ref 70–99)
Glucose-Capillary: 106 mg/dL — ABNORMAL HIGH (ref 70–99)

## 2023-06-06 LAB — CULTURE, BLOOD (ROUTINE X 2): Culture: NO GROWTH

## 2023-06-06 LAB — MAGNESIUM: Magnesium: 2.2 mg/dL (ref 1.7–2.4)

## 2023-06-06 LAB — HEPARIN LEVEL (UNFRACTIONATED): Heparin Unfractionated: 0.73 [IU]/mL — ABNORMAL HIGH (ref 0.30–0.70)

## 2023-06-06 LAB — APTT: aPTT: 65 s — ABNORMAL HIGH (ref 24–36)

## 2023-06-06 MED ORDER — PENTAFLUOROPROP-TETRAFLUOROETH EX AERO: 1.0000 | INHALATION_SPRAY | CUTANEOUS | Status: DC | PRN

## 2023-06-06 MED ORDER — AMIODARONE HCL 200 MG PO TABS
200.0000 mg | ORAL_TABLET | Freq: Once | ORAL | Status: AC
  Administered 2023-06-06: 200 mg via ORAL
  Filled 2023-06-06: qty 1

## 2023-06-06 MED ORDER — LIDOCAINE-PRILOCAINE 2.5-2.5 % EX CREA: 1.0000 | TOPICAL_CREAM | CUTANEOUS | Status: DC | PRN

## 2023-06-06 MED ORDER — ALBUMIN HUMAN 25 % IV SOLN
INTRAVENOUS | Status: AC
  Filled 2023-06-06: qty 100

## 2023-06-06 MED ORDER — MIDODRINE HCL 5 MG PO TABS
ORAL_TABLET | ORAL | Status: AC
  Filled 2023-06-06: qty 5

## 2023-06-06 MED ORDER — POLYVINYL ALCOHOL 1.4 % OP SOLN: 1.0000 [drp] | Freq: Four times a day (QID) | OPHTHALMIC | Status: DC | PRN

## 2023-06-06 MED ORDER — ALBUMIN HUMAN 25 % IV SOLN: 25.0000 g | Freq: Once | INTRAVENOUS | Status: AC

## 2023-06-06 MED ORDER — HYDROMORPHONE HCL 1 MG/ML IJ SOLN
1.0000 mg | INTRAMUSCULAR | Status: DC | PRN
  Administered 2023-06-06 – 2023-06-07 (×2): 1 mg via INTRAVENOUS
  Filled 2023-06-06 (×2): qty 1

## 2023-06-06 MED ORDER — GLYCOPYRROLATE 1 MG PO TABS: 1.0000 mg | ORAL_TABLET | ORAL | Status: DC | PRN

## 2023-06-06 MED ORDER — GLYCOPYRROLATE 0.2 MG/ML IJ SOLN: 0.2000 mg | INTRAMUSCULAR | Status: DC | PRN

## 2023-06-06 MED ORDER — GLYCOPYRROLATE 0.2 MG/ML IJ SOLN
0.2000 mg | INTRAMUSCULAR | Status: DC | PRN
  Administered 2023-06-06 – 2023-06-07 (×2): 0.2 mg via INTRAVENOUS
  Filled 2023-06-06 (×2): qty 1

## 2023-06-06 MED ORDER — HYDROMORPHONE HCL 1 MG/ML IJ SOLN
0.5000 mg | INTRAMUSCULAR | Status: DC | PRN
  Administered 2023-06-06: 0.5 mg via INTRAVENOUS

## 2023-06-06 MED ORDER — HYDROMORPHONE HCL 1 MG/ML IJ SOLN
INTRAMUSCULAR | Status: AC
  Filled 2023-06-06: qty 1

## 2023-06-06 NOTE — Hospital Course (Signed)
82yo with h/o CAD, HTN, HLD, HFrEF (EF <20%), afib s/p DCCV on amiodarone, midodrine, and Eliquis (recently diagnosed, admitted 11/15-27), OSA, DM, and ESRD on MWF HD who presented on 11/29 with AMS and hypotension that occurred after HD.  He was admitted to ICU on vasopressors for sepsis shock from HCAP, blood cultures growing Staph Warneri.  ID consulting, on Vanc/Cefepime.  He was able to come off pressors on 12/1 but remains in SDU due to persistent encephalopathy.  He remains on 4-6L NCO2 and intermittent BIPAP.

## 2023-06-06 NOTE — Progress Notes (Signed)
Progress Note   Patient: Robert Lucas:[id number] DOB: 01/14/1941 DOA: 06/01/2023     5 DOS: the patient was seen and examined on 06/06/2023   Brief hospital course: 82yo with h/o CAD, HTN, HLD, HFrEF (EF <20%), afib s/p DCCV on amiodarone, midodrine, and Eliquis (recently diagnosed, admitted 11/15-27), OSA, DM, and ESRD on MWF HD who presented on 11/29 with AMS and hypotension that occurred after HD.  He was admitted to ICU on vasopressors for sepsis shock from HCAP, blood cultures growing Staph Warneri.  ID consulting, on Vanc/Cefepime.  He was able to come off pressors on 12/1 but remains in SDU due to persistent encephalopathy.  He remains on 4-6L NCO2 and intermittent BIPAP.  Assessment and Plan:  Severe sepsis with septic shock  Shock resolved, off pressors 12/1 See HCAP, Bacteremia Follow cultures & continue antibiotics   Bacteremia Initial blood culture grew staph warneri Repeat cultures negative to date Consult Infectious Disease, appreciate antibiotic recommendations Continue Vanc/Cefepime for now   HCAP (healthcare-associated pneumonia) Continue Cefepime, Vancomycin Mucinex Remains on oxygen Scheduled Duonebs Q6H   Acute respiratory failure with hypoxia (HCC) Due to pneumonia - see HCAP Remains on 4-6 L/min oxygen Wean O2 as tolerated, goal spO2 > 90% Pulmonary hygiene - IS, flutter BiPAP as needed   Acute metabolic encephalopathy Due to septic shock, hypoxia, underlying renal failure. Mgmt of underlying issues as outlined Delirium precautions Neuro checks Monitor & replace electrolytes   Hyponatremia Chronic, stable Monitor   ESRD on dialysis [facility]) Dialysis per Nephrology Vascular surgery performed fistulogram on 12/2 with stent placement   Pancytopenia  Resolved Ongoing thrombocytopenia (plt 114) and anemia (Hgb 8.2) Monitor on heparin   Type II diabetes mellitus with renal manifestations  Sliding scale novolog   Atrial fibrillation,  chronic Currently on IV heparin HR's controlled Telemetry  Acute on chronic HFrEF (heart failure with reduced ejection fraction) (HCC) Echo 06/03/23 - EF 30-35%, grade II DD, moderately elevated PASP, mod TR, mild AS Volume mgmt by dialysis   Essential hypertension Currently soft BP's Continue midodrine   Obesity (BMI 30-39.9) Body mass index is 30.93 kg/m. Complicates overall care and prognosis.  Recommend lifestyle modifications including physical activity and diet for weight loss and overall long-term health.   Gout On allopurinol   Hyperlipidemia On Lipitor  GOC Patient is reporting that he is suffering Long discussion with family at bedside They are likely to transition to comfort only tomorrow after ongoing discussion with other family members Palliative care consulted Today's HD session appears to have been his last Anticipate in-hospital death Will increase pain control to Dilaudid for now     Consultants: PCCM ID Nephrology Vascular surgery Palliative care SLP PT OT TOC team  Procedures: HD 11/30, 12/1 Echocardiogram 12/1 Shuntogram and stent placement 12/2  Antibiotics: Vancomycin 11/29-30, 12/2- Ceftriaxone 11/29 Cefepime 11/29-12/3 Azithromycin 11/29-30  30 Day Unplanned Readmission Risk Score    Flowsheet Row ED to Hosp-Admission (Current) from 06/01/2023 in [facility] ICU/CCU  30 Day Unplanned Readmission Risk Score (%) 38 Filed at 06/06/2023 0801       This score is the patient's risk of an unplanned readmission within 30 days of being discharged (0 -100%). The score is based on dignosis, age, lab data, medications, orders, and past utilization.   Low:  0-14.9   Medium: 15-21.9   High: 22-29.9   Extreme: 30 and above           Subjective: Patient was uncomfortable this AM, feels "  terrible."  Told the nurse he is miserable and wants to be comfortable.  Was SOB this AM and requested to be on  BIPAP.   Objective: Vitals:   06/06/23 0800 06/06/23 0817  BP: (!) 107/51   Pulse: 70 72  Resp: 19 15  Temp:    SpO2: 100% 100%    Intake/Output Summary (Last 24 hours) at 06/06/2023 0820 Last data filed at 06/05/2023 2200 Gross per 24 hour  Intake 248.01 ml  Output --  Net 248.01 ml   Filed Weights   06/04/23 0500 06/05/23 0500 06/06/23 0500  Weight: 76.7 kg 72.8 kg 75.1 kg    Exam:  General:  Appears ill, uncomfortable Eyes:  PERRL, EOMI, normal lids, iris ENT:  grossly normal hearing, lips & tongue, mmm Neck:  no LAD, masses or thyromegaly Cardiovascular:  RRR, no m/r/g. No LE edema.  Respiratory:   CTA bilaterally with no wheezes/rales/rhonchi.  Normal respiratory effort. Abdomen:  soft, NT, ND Skin:  no rash or induration seen on limited exam Musculoskeletal:  anasarca Psychiatric:  blunted mood and affect, speech fluent and appropriate, AOx3 Neurologic:  CN 2-12 grossly intact, moves all extremities in coordinated fashion  Data Reviewed: I have reviewed the patient's lab results since admission.  Pertinent labs for today include:   Na++ 129 BUN 96/Creatinine 5.47/GFR 10 AP 152 Albumin 2.2 Bili 1.2 WBC 10.5 Hgb 8.2 Platelets 114 Wound culture - rare WBCs, no orgs, culture pending    Family Communication: None present this AM; I spoke with his wife and daughter at the bedside this afternoon  Disposition: Status is: Inpatient Remains inpatient appropriate because: transitioning to comfort tomorrow, likely     Time spent: 50 minutes  Unresulted Labs (From admission, onward)     Start     Ordered   06/01/23 2018  Expectorated Sputum Assessment w Gram Stain, Rflx to Resp Cult  (COPD / Pneumonia / Cellulitis / Lower Extremity Wound)  Once,   R        06/01/23 2020             Author: Jonah Blue, MD 06/06/2023 8:20 AM  For on call review [email or web address].

## 2023-06-06 NOTE — Progress Notes (Signed)
       CROSS COVER NOTE  NAME: Robert Lucas MRN: [id number] DOB : 1940-08-04    Concern as stated by nurse / staff   N/a     Pertinent findings on chart review: See this day progress notes Basil Dess MD  Assessment and  Interventions   Assessment:    06/06/2023    8:00 PM 06/06/2023    6:18 PM 06/06/2023    5:58 PM  Vitals with BMI  Weight   152 lbs 5 oz  BMI   27.86  Systolic 98 99   Diastolic 64 59   Pulse 142 138    Family decision to transition to comfort care now. Daughter and wife at bedside who have talked with the 2 sons and they feel they do not want him to suffer and wife states she has had conversations in past with patient and knows he would not want to suffer Plan: Patient transitioned to comfort care status. All treatment/medications except those for comfort discontinued Transfer to 1C       Donnie Mesa NP [facility] Cross Cover 7pm-7am - check amion for availability Pager [phone]

## 2023-06-06 NOTE — Progress Notes (Signed)
Pt arrived to unit from dialysis Alert, very lethargic but responsive. HR Afib RVR, BP soft, in pain-MD notified, O2 at 97% on 3L. Will continue to monitor

## 2023-06-06 NOTE — Progress Notes (Addendum)
[facility] [place], [place] 06/06/23  Subjective:   Hospital day # 5 Robert Lucas is a 82 y.o. male  hx of sCHF with EF< 20%, A fib on Eliquis, HTN, HLD, DM, CAD, gout, BPH, anemia, Bell's palsy, obesity, OSA not using CPAP, former smoker, chronic hyponatremia, admitted due to sepsis and HCAP,    Patient seen sitting up in bed Bipap in place Appears uncomfortable, states he can't breath Remains edematous  Objective:  Vital signs in last 24 hours:  Temp:  [97.9 F (36.6 C)-98.5 F (36.9 C)] 97.9 F (36.6 C) (12/04 0400) Pulse Rate:  [70-94] 72 (12/04 0820) Resp:  [13-21] 18 (12/04 0820) BP: (92-133)/(48-117) 107/51 (12/04 0800) SpO2:  [88 %-100 %] 100 % (12/04 0820) FiO2 (%):  [32 %-35 %] 35 % (12/04 0817) Weight:  [75.1 kg] 75.1 kg (12/04 0500)  Weight change: 2.3 kg Filed Weights   06/04/23 0500 06/05/23 0500 06/06/23 0500  Weight: 76.7 kg 72.8 kg 75.1 kg    Intake/Output:    Intake/Output Summary (Last 24 hours) at 06/06/2023 0942 Last data filed at 06/05/2023 2200 Gross per 24 hour  Intake 248.01 ml  Output --  Net 248.01 ml     Physical Exam: General: Lying in the bed, restless  HEENT Moist oral mucous membranes  Pulm/lungs Scattered rhonchi, Bipap  CVS/Heart Irregular  Abdomen:  Mildly distended  Extremities: 1+ lower extremity edema, 2+ BUE  Neurologic: Alert  Skin: Bruising over multiple areas  Access: Left upper extremity AV graft       Basic Metabolic Panel:  Recent Labs  Lab 06/01/23 1819 06/01/23 2357 06/02/23 0213 06/02/23 1400 06/02/23 2150 06/03/23 0350 06/04/23 0428 06/05/23 0506 06/06/23 0514  NA 123*   < > 121*   < > 127* 127* 126* 129* 129*  K 3.4*   < > 3.5   < > 3.2* 3.5 4.4 4.8 4.9  CL 87*   < > 86*   < > 92* 91* 92* 94* 95*  CO2 23   < > 21*   < > 24 22 21* 21* 22  GLUCOSE 186*   < > 206*   < > 206* 218* 133* 147* 73  BUN 51*   < > 59*   < > 32* 40* 57* 74* 96*  CREATININE 3.60*   < > 3.81*   < >  2.27* 2.83* 3.82* 4.67* 5.47*  CALCIUM 8.0*   < > 7.4*   < > 8.0* 8.0* 8.7* 8.6* 8.0*  MG  --   --  1.7  --   --  2.0 2.1  --   --   PHOS 3.3  --  4.8*  --   --  3.4 4.8* 6.4*  --    < > = values in this interval not displayed.     CBC: Recent Labs  Lab 06/01/23 1819 06/02/23 0213 06/03/23 0350 06/04/23 0428 06/05/23 0506 06/06/23 0514  WBC 3.3* 7.2 5.2 9.2 11.4* 10.5  NEUTROABS 2.4  --   --   --   --   --   HGB 10.4* 9.7* 8.4* 9.0* 8.8* 8.2*  HCT 30.2* 27.9* 24.1* 25.7* 25.4* 24.0*  MCV 99.7 98.2 98.4 96.6 97.7 99.2  PLT 97* 100* 86* 79* 119* 114*      Lab Results  Component Value Date   HEPBSAG NON REACTIVE 05/20/2023      Microbiology:  Recent Results (from the past 240 hour(s))  Culture, blood (x 2)  Status: None   Collection Time: 06/01/23  6:22 PM   Specimen: BLOOD  Result Value Ref Range Status   Specimen Description BLOOD BLOOD LEFT ARM  Final   Special Requests   Final    BOTTLES DRAWN AEROBIC AND ANAEROBIC Blood Culture adequate volume   Culture   Final    NO GROWTH 5 DAYS Performed at [facility], [address], [place], [place] [address]    Report Status 06/06/2023 FINAL  Final  Culture, blood (x 2)     Status: Abnormal (Preliminary result)   Collection Time: 06/01/23  6:22 PM   Specimen: BLOOD  Result Value Ref Range Status   Specimen Description   Final    BLOOD NECK Performed at [facility], [address], [place], [place] [address]    Special Requests   Final    BOTTLES DRAWN AEROBIC AND ANAEROBIC Blood Culture adequate volume Performed at [facility], [address] [place], [place] [address]    Culture  Setup Time   Final    GRAM POSITIVE COCCI IN BOTH AEROBIC AND ANAEROBIC BOTTLES CRITICAL RESULT CALLED TO, READ BACK BY AND VERIFIED WITH: ANDREA DOBBS ON 06/02/23 AT 1126 QSD    Culture (A)  Final    STAPHYLOCOCCUS WARNERI CULTURE REINCUBATED FOR BETTER GROWTH Performed at [facility], [address] [place], [place] [address]    Report Status PENDING  Incomplete  Blood Culture ID Panel (Reflexed)     Status: Abnormal   Collection Time: 06/01/23  6:22 PM  Result Value Ref Range Status   Enterococcus faecalis NOT DETECTED NOT DETECTED Final   Enterococcus Faecium NOT DETECTED NOT DETECTED Final   Listeria monocytogenes NOT DETECTED NOT DETECTED Final   Staphylococcus species DETECTED (A) NOT DETECTED Final    Comment: CRITICAL RESULT CALLED TO, READ BACK BY AND VERIFIED WITH: ANDREA DOBBS ON 06/02/23 AT 1126 QSD    Staphylococcus aureus (BCID) NOT DETECTED NOT DETECTED Final   Staphylococcus epidermidis NOT DETECTED NOT DETECTED Final   Staphylococcus lugdunensis NOT DETECTED NOT DETECTED Final   Streptococcus species NOT DETECTED NOT DETECTED Final   Streptococcus agalactiae NOT DETECTED NOT DETECTED Final   Streptococcus pneumoniae NOT DETECTED NOT DETECTED Final   Streptococcus pyogenes NOT DETECTED NOT DETECTED Final   A.calcoaceticus-baumannii NOT DETECTED NOT DETECTED Final   Bacteroides fragilis NOT DETECTED NOT DETECTED Final   Enterobacterales NOT DETECTED NOT DETECTED Final   Enterobacter cloacae complex NOT DETECTED NOT DETECTED Final   Escherichia coli NOT DETECTED NOT DETECTED Final   Klebsiella aerogenes NOT DETECTED NOT DETECTED Final   Klebsiella oxytoca NOT DETECTED NOT DETECTED Final   Klebsiella pneumoniae NOT DETECTED NOT DETECTED Final   Proteus species NOT DETECTED NOT DETECTED Final   Salmonella species NOT DETECTED NOT DETECTED Final   Serratia marcescens NOT DETECTED NOT DETECTED Final   Haemophilus influenzae NOT DETECTED NOT DETECTED Final   Neisseria meningitidis NOT DETECTED NOT DETECTED Final   Pseudomonas aeruginosa NOT DETECTED NOT DETECTED Final   Stenotrophomonas maltophilia NOT DETECTED NOT DETECTED Final   Candida albicans NOT DETECTED NOT DETECTED Final   Candida auris NOT DETECTED NOT DETECTED Final   Candida glabrata NOT  DETECTED NOT DETECTED Final   Candida krusei NOT DETECTED NOT DETECTED Final   Candida parapsilosis NOT DETECTED NOT DETECTED Final   Candida tropicalis NOT DETECTED NOT DETECTED Final   Cryptococcus neoformans/gattii NOT DETECTED NOT DETECTED Final    Comment: Performed at  [facility], [address], [place], [place] [address]  Resp panel by RT-PCR (RSV, Flu A&B, Covid) Anterior Nasal Swab     Status: None   Collection Time: 06/01/23  7:14 PM   Specimen: Anterior Nasal Swab  Result Value Ref Range Status   SARS Coronavirus 2 by RT PCR NEGATIVE NEGATIVE Final    Comment: (NOTE) SARS-CoV-2 target nucleic acids are NOT DETECTED.  The SARS-CoV-2 RNA is generally detectable in upper respiratory specimens during the acute phase of infection. The lowest concentration of SARS-CoV-2 viral copies this assay can detect is 138 copies/mL. A negative result does not preclude SARS-Cov-2 infection and should not be used as the sole basis for treatment or other patient management decisions. A negative result may occur with  improper specimen collection/handling, submission of specimen other than nasopharyngeal swab, presence of viral mutation(s) within the areas targeted by this assay, and inadequate number of viral copies(<138 copies/mL). A negative result must be combined with clinical observations, patient history, and epidemiological information. The expected result is Negative.  Fact Sheet for Patients:  BloggerCourse.com  Fact Sheet for Healthcare Providers:  SeriousBroker.it  This test is no t yet approved or cleared by the [place] FDA and  has been authorized for detection and/or diagnosis of SARS-CoV-2 by FDA under an Emergency Use Authorization (EUA). This EUA will remain  in effect (meaning this test can be used) for the duration of the COVID-19 declaration under Section 564(b)(1) of the Act, 21 U.S.C.section  360bbb-3(b)(1), unless the authorization is terminated  or revoked sooner.       Influenza A by PCR NEGATIVE NEGATIVE Final   Influenza B by PCR NEGATIVE NEGATIVE Final    Comment: (NOTE) The Xpert Xpress SARS-CoV-2/FLU/RSV plus assay is intended as an aid in the diagnosis of influenza from Nasopharyngeal swab specimens and should not be used as a sole basis for treatment. Nasal washings and aspirates are unacceptable for Xpert Xpress SARS-CoV-2/FLU/RSV testing.  Fact Sheet for Patients: BloggerCourse.com  Fact Sheet for Healthcare Providers: SeriousBroker.it  This test is not yet approved or cleared by the [place] FDA and has been authorized for detection and/or diagnosis of SARS-CoV-2 by FDA under an Emergency Use Authorization (EUA). This EUA will remain in effect (meaning this test can be used) for the duration of the COVID-19 declaration under Section 564(b)(1) of the Act, 21 U.S.C. section 360bbb-3(b)(1), unless the authorization is terminated or revoked.     Resp Syncytial Virus by PCR NEGATIVE NEGATIVE Final    Comment: (NOTE) Fact Sheet for Patients: BloggerCourse.com  Fact Sheet for Healthcare Providers: SeriousBroker.it  This test is not yet approved or cleared by the [place] FDA and has been authorized for detection and/or diagnosis of SARS-CoV-2 by FDA under an Emergency Use Authorization (EUA). This EUA will remain in effect (meaning this test can be used) for the duration of the COVID-19 declaration under Section 564(b)(1) of the Act, 21 U.S.C. section 360bbb-3(b)(1), unless the authorization is terminated or revoked.  Performed at [facility], [address] [place], [place] [address]   MRSA Next Gen by PCR, Nasal     Status: None   Collection Time: 06/02/23  2:11 AM   Specimen: Nasal Mucosa; Nasal Swab  Result Value Ref Range  Status   MRSA by PCR Next Gen NOT DETECTED NOT DETECTED Final    Comment: (NOTE) The GeneXpert MRSA Assay (FDA approved for NASAL specimens only), is one component of a comprehensive MRSA colonization surveillance  program. It is not intended to diagnose MRSA infection nor to guide or monitor treatment for MRSA infections. Test performance is not FDA approved in patients less than 44 years old. Performed at [facility], [address] [place], [place] [address]   Culture, blood (Routine X 2) w Reflex to ID Panel     Status: None (Preliminary result)   Collection Time: 06/02/23  1:13 PM   Specimen: BLOOD  Result Value Ref Range Status   Specimen Description BLOOD BLOOD RIGHT HAND  Final   Special Requests   Final    BOTTLES DRAWN AEROBIC ONLY Blood Culture adequate volume   Culture   Final    NO GROWTH 4 DAYS Performed at [facility], [address] [place], [place] [address]    Report Status PENDING  Incomplete  Culture, blood (Routine X 2) w Reflex to ID Panel     Status: None (Preliminary result)   Collection Time: 06/02/23  2:15 PM   Specimen: BLOOD  Result Value Ref Range Status   Specimen Description BLOOD PICC LINE  Final   Special Requests   Final    BOTTLES DRAWN AEROBIC AND ANAEROBIC Blood Culture adequate volume   Culture   Final    NO GROWTH 4 DAYS Performed at [facility], [address] [place], [place] [address]    Report Status PENDING  Incomplete  Aerobic Culture w Gram Stain (superficial specimen)     Status: None (Preliminary result)   Collection Time: 06/05/23  5:37 PM   Specimen: Wound  Result Value Ref Range Status   Specimen Description   Final    WOUND Performed at [facility], [address], [place], [place] [address]    Special Requests   Final    LEFT FOREARM Performed at [facility], [address] [place], [place] [address]    Gram Stain   Final    RARE WBC PRESENT, PREDOMINANTLY  PMN NO ORGANISMS SEEN Performed at [facility], [address] [place], [place] [address]    Culture PENDING  Incomplete   Report Status PENDING  Incomplete    Coagulation Studies: No results for input(s): "LABPROT", "INR" in the last 72 hours.   Urinalysis: No results for input(s): "COLORURINE", "LABSPEC", "PHURINE", "GLUCOSEU", "HGBUR", "BILIRUBINUR", "KETONESUR", "PROTEINUR", "UROBILINOGEN", "NITRITE", "LEUKOCYTESUR" in the last 72 hours.  Invalid input(s): "APPERANCEUR"    Imaging: DG Chest Port 1 View  Result Date: 06/05/2023 CLINICAL DATA:  Hypoxia. EXAM: PORTABLE CHEST 1 VIEW COMPARISON:  Radiographs 06/01/2023 and 05/18/2023.  CT 05/18/2023. FINDINGS: 1212 hours. The heart is enlarged but stable. There is aortic atherosclerosis and chronic vascular congestion. A right pleural effusion has enlarged with extension into the minor fissure and increased right basilar opacity, likely atelectasis. Stable mild atelectasis medially at the left lung base. No significant left pleural effusion. No pneumothorax or acute osseous abnormality. Telemetry leads overlie the chest. IMPRESSION: 1. Enlarging right pleural effusion with increased right basilar opacity, likely atelectasis. 2. Stable cardiomegaly and chronic vascular congestion. 3. Aortic atherosclerosis. Electronically Signed   By: Carey Bullocks M.D.   On: 06/05/2023 16:03   PERIPHERAL VASCULAR CATHETERIZATION  Result Date: 06/04/2023 See surgical note for result.    Medications:    albumin human     vancomycin Stopped (06/04/23 1342)    allopurinol  100 mg Oral Daily   amiodarone  200 mg Oral Daily   atorvastatin  40 mg Oral QHS   Chlorhexidine  Gluconate Cloth  6 each Topical Daily   epoetin alfa-epbx (RETACRIT) injection  4,000 Units Subcutaneous Q M,W,F-HD   insulin aspart  0-15 Units Subcutaneous Q4H   ipratropium-albuterol  3 mL Nebulization Q6H   midodrine  10 mg Oral TID WC   mouth rinse  15 mL Mouth Rinse 4  times per day   pantoprazole (PROTONIX) IV  40 mg Intravenous Daily   sodium chloride flush  10-40 mL Intracatheter Q12H   sodium chloride flush  3 mL Intravenous Q12H   sodium chloride  1 g Oral BID WC   acetaminophen, acetaminophen, dextromethorphan-guaiFENesin, heparin, ondansetron (ZOFRAN) IV, mouth rinse, polyethylene glycol, sodium chloride flush, traMADol  Assessment/ Plan:  82 y.o. male with  medical problems of  sCHF with EF< 20%, A fib on Eliquis, HTN, HLD, DM, CAD with history of CABG, gout, BPH, anemia, Bell's palsy, obesity, OSA not using CPAP, former smoker, chronic hyponatremia,   admitted on 06/01/2023 for Acute encephalopathy [G93.40] HCAP (healthcare-associated pneumonia) [J18.9] Pneumonia of right lung due to infectious organism, unspecified part of lung [J18.9]   Outpatient dialysis--[facility], [place] Puls, MD Mircera 50 mcg, Venofer Target weight 68 kg   # ESRD with volume overload. Will schedule dialysis treatment today, UF goal 3-3.5L as tolerated. Will attempt to wean off bipap.  Addendum: Received on [place], stable. Patient heart rate elevated with hypotension. Ordered albumin and amiodarone 200mg  po. Reduced UF. Will provide additional treatment tomorrow.   # Hyponatremia Sodium remains 129.  Continue to monitor closely.   #Anemia in CKD Receives Mircera and IV Venofer outpatient. Hemoglobin continues to decrease, 8.2.  Low-dose Epogen with dialysis.   # Thrombocytopenia Known at least since April 2023.  Platelets range between 59-140.  #Left upper extremity cellulitis Staphylococcus bacteremia. Patient was evaluated by vascular surgeon on 11/30. Ultrasound of the graft did not show any obvious fluid collection. No indication for removal of AV graft. Functioning well    LOS: 5 [facility] 12/4/20249:42 AM  [address] [place], [place] [phone]

## 2023-06-06 NOTE — Progress Notes (Signed)
OT Cancellation Note  Patient Details Name: Robert Lucas MRN: [id number] DOB: [birth year]   Cancelled Treatment:    Reason Eval/Treat Not Completed: Patient at procedure or test/ unavailable. Pt noted to be off the floor for HD, unavailable at this time. Will continue to follow POC at later date/time as pt available.   Kathie Dike, M.S. OTR/L  06/06/23, 2:41 PM  ascom [phone]

## 2023-06-06 NOTE — Progress Notes (Signed)
Hemodialysis note  Received patient in bed to unit. Patient is lethargic but is easily arousable and oriented x 3.  Informed consent secured and attached to chart.   1450: Patient started having Afib in RVR. HR: 100-130's. HD NP is aware and present and she notified Dr. Cherylann Ratel. Amiodarone 200 mg tab PO given and ordered to continue HD. Patient was closely monitored during HD. UF was off for the rest of the tx.   Treatment initiated: 1345 Treatment completed: 1733  Patient tolerated well. Transported back to room, alert without acute distress.  Report given to patient's RN.   Access used: LUA AVG Access issues: none  Total UF removed: 700 ml Medication(s) given:  Tramadol, Midodrine 10 mg tab PO, Albumin 25g IV, Amiodarone 200 mg tab PO  Post HD weight: 69.1 kg   [facility]

## 2023-06-06 NOTE — Progress Notes (Signed)
Date of Admission:  06/01/2023     ID: Robert Lucas is a 82 y.o. male with   Principal Problem:   Severe sepsis with septic shock (CODE) (HCC) Active Problems:   Essential hypertension   Hyperlipidemia   Gout   Obesity (BMI 30-39.9)   Acute on chronic HFrEF (heart failure with reduced ejection fraction) (HCC)   Acute metabolic encephalopathy   Acute respiratory failure with hypoxia (HCC)   Hyponatremia   Atrial fibrillation, chronic (HCC)   HCAP (healthcare-associated pneumonia)   Type II diabetes mellitus with renal manifestations (HCC)   Pancytopenia (HCC)   ESRD on dialysis (HCC)   Hypokalemia   Bacteremia    Subjective: Pt in dialysis  Medications:   allopurinol  100 mg Oral Daily   amiodarone  200 mg Oral Daily   atorvastatin  40 mg Oral QHS   Chlorhexidine Gluconate Cloth  6 each Topical Daily   epoetin alfa-epbx (RETACRIT) injection  4,000 Units Subcutaneous Q M,W,F-HD   insulin aspart  0-15 Units Subcutaneous Q4H   ipratropium-albuterol  3 mL Nebulization Q6H   midodrine  10 mg Oral TID WC   mouth rinse  15 mL Mouth Rinse 4 times per day   pantoprazole (PROTONIX) IV  40 mg Intravenous Daily   sodium chloride flush  10-40 mL Intracatheter Q12H   sodium chloride flush  3 mL Intravenous Q12H   sodium chloride  1 g Oral BID WC    Objective: Vital signs in last 24 hours: Patient Vitals for the past 24 hrs:  BP Temp Temp src Pulse Resp SpO2 Weight  06/06/23 1200 (!) 118/50 -- -- 81 20 94 % --  06/06/23 1100 -- -- -- 75 20 97 % --  06/06/23 1000 (!) 112/58 -- -- 74 20 98 % --  06/06/23 0900 (!) 109/51 -- -- -- 12 -- --  06/06/23 0820 -- -- -- 72 18 100 % --  06/06/23 0817 -- -- -- 72 15 100 % --  06/06/23 0800 (!) 107/51 -- -- 70 19 100 % --  06/06/23 0500 -- -- -- -- -- -- 75.1 kg  06/06/23 0400 104/61 97.9 F (36.6 C) Axillary 72 13 100 % --  06/06/23 0300 (!) 92/48 -- -- 71 13 100 % --  06/06/23 0200 (!) 110/54 -- -- 75 17 100 % --  06/06/23 0000  (!) 106/55 97.9 F (36.6 C) Axillary 81 15 100 % --  06/05/23 2300 (!) 108/54 -- -- 86 17 100 % --  06/05/23 2200 (!) 119/56 -- -- 88 18 100 % --  06/05/23 2000 (!) 112/59 98 F (36.7 C) Axillary 90 18 96 % --  06/05/23 1900 (!) 110/58 -- -- 91 (!) 21 (!) 88 % --  06/05/23 1800 (!) 133/117 -- -- 94 19 (!) 89 % --  06/05/23 1700 (!) 120/58 -- -- 92 16 96 % --  06/05/23 1600 (!) 114/55 98.5 F (36.9 C) -- 89 15 99 % --  06/05/23 1400 119/77 -- -- 93 18 95 % --  06/05/23 1300 (!) 111/52 -- -- 87 20 98 % --       PHYSICAL EXAM:  General: somnolent- on calling his name he responds to simple questions- oriented in person and place  Lungs: b/l air entry Heart: irregular  Abdomen: Soft, non-tender,not distended. Bowel sounds normal. No masses Extremities: left arm swollen Skin: No rashes or lesions. Or bruising Lymph: Cervical, supraclavicular normal. Neurologic: Grossly non-focal  Lab Results  Latest Ref Rng & Units 06/06/2023    5:14 AM 06/05/2023    5:06 AM 06/04/2023    4:28 AM  CBC  WBC 4.0 - 10.5 K/uL 10.5  11.4  9.2   Hemoglobin 13.0 - 17.0 g/dL 8.2  8.8  9.0   Hematocrit 39.0 - 52.0 % 24.0  25.4  25.7   Platelets 150 - 400 K/uL 114  119  79        Latest Ref Rng & Units 06/06/2023    5:14 AM 06/05/2023    5:06 AM 06/04/2023    4:28 AM  CMP  Glucose 70 - 99 mg/dL 73  664  403   BUN 8 - 23 mg/dL 96  74  57   Creatinine 0.61 - 1.24 mg/dL 4.74  2.59  5.63   Sodium 135 - 145 mmol/L 129  129  126   Potassium 3.5 - 5.1 mmol/L 4.9  4.8  4.4   Chloride 98 - 111 mmol/L 95  94  92   CO2 22 - 32 mmol/L 22  21  21    Calcium 8.9 - 10.3 mg/dL 8.0  8.6  8.7   Total Protein 6.5 - 8.1 g/dL 5.5   5.4   Total Bilirubin <1.2 mg/dL 1.2   1.1   Alkaline Phos 38 - 126 U/L 152   150   AST 15 - 41 U/L 23   47   ALT 0 - 44 U/L 24   31       Microbiology: 06/01/23 BC 1 set has staph warneri and staph epi 2nd culture NG 11/30 BC NG Studies/Results: DG Chest Port 1 View  Result  Date: 06/05/2023 CLINICAL DATA:  Hypoxia. EXAM: PORTABLE CHEST 1 VIEW COMPARISON:  Radiographs 06/01/2023 and 05/18/2023.  CT 05/18/2023. FINDINGS: 1212 hours. The heart is enlarged but stable. There is aortic atherosclerosis and chronic vascular congestion. A right pleural effusion has enlarged with extension into the minor fissure and increased right basilar opacity, likely atelectasis. Stable mild atelectasis medially at the left lung base. No significant left pleural effusion. No pneumothorax or acute osseous abnormality. Telemetry leads overlie the chest. IMPRESSION: 1. Enlarging right pleural effusion with increased right basilar opacity, likely atelectasis. 2. Stable cardiomegaly and chronic vascular congestion. 3. Aortic atherosclerosis. Electronically Signed   By: Carey Bullocks M.D.   On: 06/05/2023 16:03   PERIPHERAL VASCULAR CATHETERIZATION  Result Date: 06/04/2023 See surgical note for result.    Assessment/Plan: Pt presenting with confusion , fever  Encephalopathy could be from infection Stap warneri bacteremia in 1 set but that set also has staph epidermidis questing both organisms to be skin contaminants    Left arm graft- swollen wound present- chalky discharge infection VS gout  culture sent   Malfunctioning graft with pseudoaneurysm- s/p fistulogram and placement of stent   Pt will need minimum 2-4 weeks of antibiotic On vanco- can be given during dialysis 2 d echo no vegetation Repeat culture neg ?   _Afib on amiodarone    Discussed the management with the care team

## 2023-06-06 NOTE — Progress Notes (Signed)
PT Cancellation Note  Patient Details Name: Robert Lucas MRN: [id number] DOB: 04-27-1941   Cancelled Treatment:     PT attempt. Pt just down to HD. Author will return tomorrow and continue to follow per current POC.   Rushie Chestnut 06/06/2023, 1:25 PM

## 2023-06-07 LAB — CULTURE, BLOOD (ROUTINE X 2): Special Requests: ADEQUATE

## 2023-06-08 LAB — CULTURE, BLOOD (ROUTINE X 2)
Culture: NO GROWTH
Culture: NO GROWTH
Special Requests: ADEQUATE
Special Requests: ADEQUATE

## 2023-06-08 LAB — AEROBIC CULTURE W GRAM STAIN (SUPERFICIAL SPECIMEN): Culture: NO GROWTH

## 2023-07-04 NOTE — Progress Notes (Signed)
Patient taken to morgue. Family was notified @ [phone]

## 2023-07-04 NOTE — Death Summary Note (Signed)
DEATH SUMMARY   Patient Details  Name: Robert Lucas MRN: [id number] DOB: [birth year] YQM:VHQION, Robert Senior, MD Admission/Discharge Information   Admit Date:  06-26-2023  Date of Death: Date of Death: 07-02-23  Time of Death: Time of Death: 0318  Length of Stay: 6   Principle Cause of death: Septic shock from Staph Robert Lucas bacteremia, HCAP  Hospital Diagnoses: Principal Problem:   Severe sepsis with septic shock (CODE) (HCC) Active Problems:   Acute metabolic encephalopathy   Acute respiratory failure with hypoxia (HCC)   HCAP (healthcare-associated pneumonia)   Bacteremia   Essential hypertension   Acute on chronic HFrEF (heart failure with reduced ejection fraction) (HCC)   Hyponatremia   Atrial fibrillation, chronic (HCC)   Type II diabetes mellitus with renal manifestations (HCC)   Pancytopenia (HCC)   ESRD on dialysis (HCC)   Hypokalemia   Hyperlipidemia   Gout   Obesity (BMI 30-39.9)   Hospital Course: 82yo with h/o CAD, HTN, HLD, HFrEF (EF <20%), afib s/p DCCV on amiodarone, midodrine, and Eliquis (recently diagnosed, admitted 11/15-27), OSA, DM, and ESRD on MWF HD who presented on 26-Jun-2023 with AMS and hypotension that occurred after HD.  He was admitted to ICU on vasopressors for sepsis shock from HCAP, blood cultures growing Staph Robert Lucas.  ID consulting, on Vanc/Cefepime.  He was able to come off pressors on 12/1 but remains in SDU due to persistent encephalopathy.  He remains on 4-6L NCO2 and intermittent BIPAP.  Assessment and Plan:  Severe sepsis with septic shock  Shock resolved, off pressors 12/1 See HCAP, Bacteremia He continued to deteriorate and family moved to comfort care on 12/4 He had a peaceful in-hospital death several hours later   Bacteremia Initial blood culture grew staph Robert Lucas Repeat cultures negative to date Consulted Infectious Disease Treated with Vanc/Cefepime    HCAP with acute hypoxic respiratory failure Treated with  Cefepime, Vancomycin Given Lerna O2 supplementation and BIPAP but he continued to progress   Acute metabolic encephalopathy Due to septic shock, hypoxia, underlying renal failure.  ESRD on dialysis [facility]) Dialysis per Nephrology Vascular surgery performed fistulogram on 12/2 with stent placement Last HD was several hours prior to death    Acute on chronic HFrEF (heart failure with reduced ejection fraction) (HCC) Echo 06/03/23 - EF 30-35%, grade II DD, moderately elevated PASP, mod TR, mild AS    GOC Patient reported that he was suffering Long discussion with family at bedside They planned to transition to comfort only on 07/02/2023 after ongoing discussion with other family members Palliative care consulted He developed worsening tachycardia and respiratory distress overnight Overnight coverage APP called family and he was changed to comfort care and died peacefully a few hours later   Consultants: PCCM ID Nephrology Vascular surgery Palliative care SLP PT OT [facility] team   Procedures: HD 11/30, 12/1 Echocardiogram 12/1 Shuntogram and stent placement 12/2  The results of significant diagnostics from this hospitalization (including imaging, microbiology, ancillary and laboratory) are listed below for reference.   Significant Diagnostic Studies: DG Chest Port 1 View  Result Date: 06/05/2023 CLINICAL DATA:  Hypoxia. EXAM: PORTABLE CHEST 1 VIEW COMPARISON:  Radiographs 2023-06-26 and 05/18/2023.  CT 05/18/2023. FINDINGS: 1212 hours. The heart is enlarged but stable. There is aortic atherosclerosis and chronic vascular congestion. A right pleural effusion has enlarged with extension into the minor fissure and increased right basilar opacity, likely atelectasis. Stable mild atelectasis medially at the left lung base. No significant left pleural effusion. No pneumothorax or acute  osseous abnormality. Telemetry leads overlie the chest. IMPRESSION: 1. Enlarging right pleural effusion with  increased right basilar opacity, likely atelectasis. 2. Stable cardiomegaly and chronic vascular congestion. 3. Aortic atherosclerosis. Electronically Signed   By: Robert Lucas M.D.   On: 06/05/2023 16:03   PERIPHERAL VASCULAR CATHETERIZATION  Result Date: 06/04/2023 See surgical note for result.  ECHOCARDIOGRAM COMPLETE  Result Date: 06/03/2023    ECHOCARDIOGRAM REPORT   Patient Name:   Robert Lucas Date of Exam: 06/03/2023 Medical Rec #:  [id number]       Height:       62.0 in Accession #:    [id number]      Weight:       163.1 lb Date of Birth:  1940-11-10      BSA:          1.753 m Patient Age:    82 years        BP:           144/59 mmHg Patient Gender: M               HR:           95 bpm. Exam Location:  [facility] Procedure: 2D Echo Indications:     Bacteremia R78.81  History:         Patient has prior history of Echocardiogram examinations.  Sonographer:     Elwin Sleight RDCS Referring Phys:  [id number] Janann Colonel Diagnosing Phys: Chilton Si MD  Sonographer Comments: Patient is obese. Image acquisition challenging due to patient body habitus and Image acquisition challenging due to respiratory motion. IMPRESSIONS  1. Left ventricular ejection fraction, by estimation, is 30 to 35%. The left ventricle has moderately decreased function. The left ventricle demonstrates global hypokinesis. There is mild concentric left ventricular hypertrophy. Left ventricular diastolic parameters are consistent with Grade II diastolic dysfunction (pseudonormalization). Elevated left ventricular end-diastolic pressure.  2. Right ventricular systolic function is normal. The right ventricular size is normal. There is moderately elevated pulmonary artery systolic pressure.  3. Left atrial size was severely dilated.  4. Right atrial size was severely dilated.  5. The mitral valve is degenerative. No evidence of mitral valve regurgitation. No evidence of mitral stenosis.  6. Tricuspid valve regurgitation is  moderate.  7. Left coronary cusp is essentially fixed. The aortic valve is tricuspid. There is mild calcification of the aortic valve. There is mild thickening of the aortic valve. Aortic valve regurgitation is not visualized. Mild aortic valve stenosis. Aortic valve area, by VTI measures 1.54 cm. Aortic valve mean gradient measures 13.0 mmHg. Aortic valve Vmax measures 2.41 m/s.  8. The inferior vena cava is dilated in size with >50% respiratory variability, suggesting right atrial pressure of 8 mmHg. FINDINGS  Left Ventricle: Left ventricular ejection fraction, by estimation, is 30 to 35%. The left ventricle has moderately decreased function. The left ventricle demonstrates global hypokinesis. The left ventricular internal cavity size was normal in size. There is mild concentric left ventricular hypertrophy. Left ventricular diastolic parameters are consistent with Grade II diastolic dysfunction (pseudonormalization). Elevated left ventricular end-diastolic pressure. Right Ventricle: The right ventricular size is normal. No increase in right ventricular wall thickness. Right ventricular systolic function is normal. There is moderately elevated pulmonary artery systolic pressure. The tricuspid regurgitant velocity is 3.56 m/s, and with an assumed right atrial pressure of 8 mmHg, the estimated right ventricular systolic pressure is 58.7 mmHg. Left Atrium: Left atrial size was severely dilated. Right Atrium: Right atrial size  was severely dilated. Pericardium: There is no evidence of pericardial effusion. Mitral Valve: The mitral valve is degenerative in appearance. Mild mitral annular calcification. No evidence of mitral valve regurgitation. No evidence of mitral valve stenosis. Tricuspid Valve: The tricuspid valve is normal in structure. Tricuspid valve regurgitation is moderate . No evidence of tricuspid stenosis. Aortic Valve: Left coronary cusp is essentially fixed. The aortic valve is tricuspid. There is mild  calcification of the aortic valve. There is mild thickening of the aortic valve. Aortic valve regurgitation is not visualized. Mild aortic stenosis is present. Aortic valve mean gradient measures 13.0 mmHg. Aortic valve peak gradient measures 23.2 mmHg. Aortic valve area, by VTI measures 1.54 cm. Pulmonic Valve: The pulmonic valve was normal in structure. Pulmonic valve regurgitation is trivial. No evidence of pulmonic stenosis. Aorta: The aortic root is normal in size and structure. Venous: The inferior vena cava is dilated in size with greater than 50% respiratory variability, suggesting right atrial pressure of 8 mmHg. IAS/Shunts: No atrial level shunt detected by color flow Doppler.  LEFT VENTRICLE PLAX 2D LVIDd:         5.10 cm      Diastology LVIDs:         4.30 cm      LV e' medial:    5.63 cm/s LV PW:         1.10 cm      LV E/e' medial:  23.6 LV IVS:        1.20 cm      LV e' lateral:   12.00 cm/s LVOT diam:     2.30 cm      LV E/e' lateral: 11.1 LV SV:         67 LV SV Index:   38 LVOT Area:     4.15 cm  LV Volumes (MOD) LV vol d, MOD A2C: 118.0 ml LV vol d, MOD A4C: 146.0 ml LV vol s, MOD A2C: 73.2 ml LV vol s, MOD A4C: 93.3 ml LV SV MOD A2C:     44.8 ml LV SV MOD A4C:     146.0 ml LV SV MOD BP:      48.9 ml RIGHT VENTRICLE RV Basal diam:  4.10 cm RV S prime:     12.30 cm/s TAPSE (M-mode): 1.9 cm LEFT ATRIUM             Index        RIGHT ATRIUM           Index LA diam:        4.80 cm 2.74 cm/m   RA Area:     24.70 cm LA Vol (A2C):   99.1 ml 56.53 ml/m  RA Volume:   95.10 ml  54.25 ml/m LA Vol (A4C):   72.0 ml 41.07 ml/m LA Biplane Vol: 82.5 ml 47.06 ml/m  AORTIC VALVE                     PULMONIC VALVE AV Area (Vmax):    1.43 cm      PV Vmax:        1.22 m/s AV Area (Vmean):   1.51 cm      PV Peak grad:   6.0 mmHg AV Area (VTI):     1.54 cm      RVOT Peak grad: 1 mmHg AV Vmax:           241.00 cm/s AV Vmean:  154.500 cm/s AV VTI:            0.435 m AV Peak Grad:      23.2 mmHg AV Mean  Grad:      13.0 mmHg LVOT Vmax:         82.70 cm/s LVOT Vmean:        56.200 cm/s LVOT VTI:          0.161 m LVOT/AV VTI ratio: 0.37  AORTA Ao Root diam: 3.40 cm Ao Asc diam:  3.40 cm MITRAL VALVE                TRICUSPID VALVE MV Area (PHT): 4.29 cm     TR Peak grad:   50.7 mmHg MV Decel Time: 177 msec     TR Vmax:        356.00 cm/s MV E velocity: 133.00 cm/s MV A velocity: 95.90 cm/s   SHUNTS MV E/A ratio:  1.39         Systemic VTI:  0.16 m                             Systemic Diam: 2.30 cm Chilton Si MD Electronically signed by Chilton Si MD Signature Date/Time: 06/03/2023/4:20:43 PM    Final    US Venous Img Upper Uni Left (DVT)  Result Date: 06/02/2023 CLINICAL DATA:  Fistula with swelling. EXAM: LEFT UPPER EXTREMITY VENOUS DOPPLER ULTRASOUND TECHNIQUE: Gray-scale sonography with graded compression, as well as color Doppler and duplex ultrasound were performed to evaluate the upper extremity deep venous system from the level of the subclavian vein and including the jugular, axillary, basilic, radial, ulnar and upper cephalic vein. Spectral Doppler was utilized to evaluate flow at rest and with distal augmentation maneuvers. COMPARISON:  None Available. FINDINGS: Contralateral Subclavian Vein: Respiratory phasicity is normal and symmetric with the symptomatic side. No evidence of thrombus. Normal compressibility. Internal Jugular Vein: No evidence of thrombus. Subclavian Vein: No evidence of thrombus Axillary Vein: No evidence of thrombus Cephalic Vein: Not well seen in this postoperative patient. Basilic Vein: No evidence of thrombus Brachial Veins: No evidence of thrombus Radial Veins: No evidence of thrombus Ulnar Veins: Not well seen Fistula in the left arm shows arterial/high flow waveform throughout its extent without detected stricture or outflow stenosis. Regional subcutaneous edema. IMPRESSION: Extensive arm edema without DVT. Unremarkable waveforms at the patient's fistula.  Electronically Signed   By: Tiburcio Pea M.D.   On: 06/02/2023 10:53   DG Chest Port 1 View  Result Date: 06/01/2023 CLINICAL DATA:  Question of sepsis to evaluate for abnormality. Altered mental status. EXAM: PORTABLE CHEST 1 VIEW COMPARISON:  05/18/2023 FINDINGS: Shallow inspiration. Cardiac enlargement with mild perihilar infiltration, likely edema but could be pneumonia. No pleural effusions. No pneumothorax. Mediastinal contours appear intact. Calcification of the aorta. IMPRESSION: Cardiac enlargement with mild perihilar infiltration, progressing since prior study. Electronically Signed   By: Burman Nieves M.D.   On: 06/01/2023 19:06   CT Head Wo Contrast  Result Date: 06/01/2023 CLINICAL DATA:  Mental status change of unknown cause. Elevated BUN. EXAM: CT HEAD WITHOUT CONTRAST TECHNIQUE: Contiguous axial images were obtained from the base of the skull through the vertex without intravenous contrast. RADIATION DOSE REDUCTION: This exam was performed according to the departmental dose-optimization program which includes automated exposure control, adjustment of the mA and/or kV according to patient size and/or use of iterative reconstruction technique. COMPARISON:  CT head  06/30/2022.  MRI brain 06/30/2022. FINDINGS: Brain: Mild diffuse cerebral atrophy. Low-attenuation changes in the deep white matter consistent with small vessel ischemia. No mass-effect or midline shift. No abnormal extra-axial fluid collections. Gray-white matter junctions are distinct. Basal cisterns are not effaced. No acute intracranial hemorrhage. Vascular: No hyperdense vessel or unexpected calcification. Skull: Normal. Negative for fracture or focal lesion. Sinuses/Orbits: No acute finding. Other: None. IMPRESSION: No acute intracranial abnormalities. Mild chronic atrophy and small vessel ischemic changes. Electronically Signed   By: Burman Nieves M.D.   On: 06/01/2023 19:05   ECHO TEE  Result Date: 05/24/2023     TRANSESOPHOGEAL ECHO REPORT   Patient Name:   MAXEMILIANO MONTNEY Date of Exam: 05/24/2023 Medical Rec #:  [id number]       Height:       62.0 in Accession #:    [id number]      Weight:       166.7 lb Date of Birth:  [birth year]      BSA:          1.769 m Patient Age:    82 years        BP:           73/56 mmHg Patient Gender: M               HR:           93 bpm. Exam Location:  Inpatient Procedure: Transesophageal Echo, Cardiac Doppler and Color Doppler Indications:    Atrial fibrillation  History:        Patient has prior history of Echocardiogram examinations, most                 recent 05/22/2023. CHF, Previous Myocardial Infarction and CAD;                 Risk Factors:Hypertension and Diabetes. ESRD on HD.  Sonographer:    Milda Smart Referring Phys: [id number] [name] PROCEDURE: After discussion of the risks and benefits of a TEE, an informed consent was obtained from the patient. TEE procedure time was 7 minutes. The transesophogeal probe was passed without difficulty through the esophogus of the patient. Imaged were  obtained with the patient in a left lateral decubitus position. Sedation performed by different physician. The patient was monitored while under deep sedation. Anesthestetic sedation was provided intravenously by Anesthesiology: 50mg  of Propofol, 30mg  of Lidocaine. Image quality was good. The patient's vital signs; including heart rate, blood pressure, and oxygen saturation; remained stable throughout the procedure. The patient developed no complications during the procedure. A successful direct current cardioversion was performed at 200/250 joules with 2 attempts.  IMPRESSIONS  1. Left ventricular ejection fraction, by estimation, is <20%. The left ventricle has severely decreased function. The left ventricle demonstrates global hypokinesis. The left ventricular internal cavity size was mildly dilated.  2. Right ventricular systolic function is moderately reduced. The right  ventricular size is mildly enlarged. There is normal pulmonary artery systolic pressure.  3. Left atrial size was mildly dilated. No left atrial/left atrial appendage thrombus was detected.  4. The mitral valve is normal in structure. Mild mitral valve regurgitation. Severe mitral annular calcification.  5. Tricuspid valve regurgitation is severe.  6. The aortic valve is tricuspid. There is moderate calcification of the aortic valve. There is moderate thickening of the aortic valve. Aortic valve regurgitation is not visualized. FINDINGS  Left Ventricle: Left ventricular ejection fraction, by estimation, is <20%. The left ventricle has severely  decreased function. The left ventricle demonstrates global hypokinesis. The left ventricular internal cavity size was mildly dilated. Right Ventricle: The right ventricular size is mildly enlarged. No increase in right ventricular wall thickness. Right ventricular systolic function is moderately reduced. There is normal pulmonary artery systolic pressure. The tricuspid regurgitant velocity is 2.79 m/s, and with an assumed right atrial pressure of 3 mmHg, the estimated right ventricular systolic pressure is 34.1 mmHg. Left Atrium: Left atrial size was mildly dilated. No left atrial/left atrial appendage thrombus was detected. Right Atrium: Right atrial size was normal in size. Pericardium: There is no evidence of pericardial effusion. Mitral Valve: The mitral valve is normal in structure. Severe mitral annular calcification. Mild mitral valve regurgitation. Tricuspid Valve: The tricuspid valve is normal in structure. Tricuspid valve regurgitation is severe. Aortic Valve: The aortic valve is tricuspid. There is moderate calcification of the aortic valve. There is moderate thickening of the aortic valve. Aortic valve regurgitation is not visualized. Pulmonic Valve: The pulmonic valve was normal in structure. Pulmonic valve regurgitation is not visualized. Aorta: The aortic root  is normal in size and structure. IAS/Shunts: No atrial level shunt detected by color flow Doppler. Additional Comments: Spectral Doppler performed. TRICUSPID VALVE TR Peak grad:   31.1 mmHg TR Vmax:        279.00 cm/s Donato Schultz MD Electronically signed by Donato Schultz MD Signature Date/Time: 05/24/2023/4:05:56 PM    Final    EP STUDY  Result Date: 05/24/2023 See surgical note for result.  ECHOCARDIOGRAM COMPLETE  Result Date: 05/22/2023    ECHOCARDIOGRAM REPORT   Patient Name:   ZAVEN CHEATOM Date of Exam: 05/22/2023 Medical Rec #:  [id number]       Height:       62.0 in Accession #:    [id number]      Weight:       173.9 lb Date of Birth:  08-01-1940      BSA:          1.802 m Patient Age:    82 years        BP:           105/93 mmHg Patient Gender: M               HR:           118 bpm. Exam Location:  Inpatient Procedure: 2D Echo, Cardiac Doppler, Color Doppler and Intracardiac            Opacification Agent Indications:    Atrial Fibrillation I48.91  History:        Patient has prior history of Echocardiogram examinations, most                 recent 01/29/2022. CHF, CAD and Previous Myocardial Infarction,                 ESRD, Arrythmias:Atrial Fibrillation and Tachycardia,                 Signs/Symptoms:Hypotension and Chest Pain; Risk                 Factors:Hypertension, Sleep Apnea, Diabetes and Dyslipidemia.  Sonographer:    Lucendia Herrlich RCS Referring Phys: Therisa Doyne IMPRESSIONS  1. Left ventricular ejection fraction, by estimation, is 25 to 30%. The left ventricle has severely decreased function. The left ventricle demonstrates regional wall motion abnormalities (see scoring diagram/findings for description). The left ventricular internal cavity size was moderately dilated. Left ventricular diastolic function could not  be evaluated. There is akinesis of the left ventricular, entire apical segment. There is akinesis of the left ventricular, apical septal wall, anterior  wall,  inferior wall and inferolateral wall.  2. Right ventricular systolic function is moderately reduced. The right ventricular size is mildly enlarged. There is moderately elevated pulmonary artery systolic pressure. The estimated right ventricular systolic pressure is 52.7 mmHg.  3. Left atrial size was moderately dilated.  4. Right atrial size was severely dilated.  5. The mitral valve is degenerative. Trivial mitral valve regurgitation. No evidence of mitral stenosis. Moderate mitral annular calcification.  6. Tricuspid valve regurgitation is mild to moderate.  7. The aortic valve is calcified. There is moderate calcification of the aortic valve. There is moderate thickening of the aortic valve. The left coronary cusp is fixed and immobile..     Aortic valve regurgitation is not visualized. No aortic stenosis is present. Aortic valve mean gradient measures 6.0 mmHg. Aortic valve Vmax measures 1.65 m/s. Although mean AVG is low, the DVI is 0.4 and SVI low at 13 indicating at least a mild to moderate degree of low flow low gradient AS. The AVA is underestimated due to small LVOT diameter measurement.  8. The inferior vena cava is dilated in size with <50% respiratory variability, suggesting right atrial pressure of 15 mmHg. FINDINGS  Left Ventricle: Left ventricular ejection fraction, by estimation, is 25 to 30%. The left ventricle has severely decreased function. The left ventricle demonstrates regional wall motion abnormalities. Definity contrast agent was given IV to delineate the left ventricular endocardial borders. The left ventricular internal cavity size was moderately dilated. There is no left ventricular hypertrophy. Left ventricular diastolic function could not be evaluated due to atrial fibrillation. Left ventricular diastolic function could not be evaluated. Right Ventricle: The right ventricular size is mildly enlarged. No increase in right ventricular wall thickness. Right ventricular systolic function  is moderately reduced. There is moderately elevated pulmonary artery systolic pressure. The tricuspid regurgitant velocity is 3.07 m/s, and with an assumed right atrial pressure of 15 mmHg, the estimated right ventricular systolic pressure is 52.7 mmHg. Left Atrium: Left atrial size was moderately dilated. Right Atrium: Right atrial size was severely dilated. Pericardium: There is no evidence of pericardial effusion. Mitral Valve: The mitral valve is degenerative in appearance. There is mild thickening of the mitral valve leaflet(s). There is mild calcification of the mitral valve leaflet(s). Moderate mitral annular calcification. Trivial mitral valve regurgitation. No evidence of mitral valve stenosis. Tricuspid Valve: The tricuspid valve is normal in structure. Tricuspid valve regurgitation is mild to moderate. No evidence of tricuspid stenosis. Aortic Valve: The left coronary cusp is fixed and immobile. The aortic valve is calcified. There is moderate calcification of the aortic valve. There is moderate thickening of the aortic valve. Aortic valve regurgitation is not visualized. Mild aortic stenosis is present. Aortic valve mean gradient measures 6.0 mmHg. Aortic valve peak gradient measures 10.9 mmHg. Aortic valve area, by VTI measures 0.92 cm. Pulmonic Valve: The pulmonic valve was normal in structure. Pulmonic valve regurgitation is trivial. No evidence of pulmonic stenosis. Aorta: The aortic root is normal in size and structure. Venous: The inferior vena cava is dilated in size with less than 50% respiratory variability, suggesting right atrial pressure of 15 mmHg. IAS/Shunts: No atrial level shunt detected by color flow Doppler.  LEFT VENTRICLE PLAX 2D LVIDd:         6.30 cm   Diastology LVIDs:  5.60 cm   LV e' medial:    5.27 cm/s LV PW:         0.95 cm   LV E/e' medial:  17.0 LV IVS:        0.70 cm   LV e' lateral:   6.65 cm/s LVOT diam:     1.70 cm   LV E/e' lateral: 13.5 LV SV:         23 LV SV  Index:   13 LVOT Area:     2.27 cm  RIGHT VENTRICLE             IVC RV S prime:     10.30 cm/s  IVC diam: 3.00 cm TAPSE (M-mode): 1.1 cm LEFT ATRIUM             Index        RIGHT ATRIUM           Index LA diam:        4.55 cm 2.53 cm/m   RA Area:     23.50 cm LA Vol (A2C):   79.1 ml 43.91 ml/m  RA Volume:   76.50 ml  42.46 ml/m LA Vol (A4C):   70.8 ml 39.30 ml/m LA Biplane Vol: 78.5 ml 43.57 ml/m  AORTIC VALVE AV Area (Vmax):    1.17 cm AV Area (Vmean):   1.10 cm AV Area (VTI):     0.92 cm AV Vmax:           165.33 cm/s AV Vmean:          111.667 cm/s AV VTI:            0.252 m AV Peak Grad:      10.9 mmHg AV Mean Grad:      6.0 mmHg LVOT Vmax:         85.03 cm/s LVOT Vmean:        54.200 cm/s LVOT VTI:          0.102 m LVOT/AV VTI ratio: 0.40  AORTA Ao Root diam: 3.20 cm Ao Asc diam:  3.65 cm MITRAL VALVE               TRICUSPID VALVE MV Area (PHT): 5.42 cm    TR Peak grad:   37.7 mmHg MV Decel Time: 140 msec    TR Vmax:        307.00 cm/s MV E velocity: 89.70 cm/s MV A velocity: 33.40 cm/s  SHUNTS MV E/A ratio:  2.69        Systemic VTI:  0.10 m                            Systemic Diam: 1.70 cm Armanda Magic MD Electronically signed by Armanda Magic MD Signature Date/Time: 05/22/2023/9:29:30 AM    Final    CT Angio Chest Pulmonary Embolism (PE) W or WO Contrast  Result Date: 05/18/2023 CLINICAL DATA:  Hypotensive following dialysis treatment, initial encounter EXAM: CT ANGIOGRAPHY CHEST WITH CONTRAST TECHNIQUE: Multidetector CT imaging of the chest was performed using the standard protocol during bolus administration of intravenous contrast. Multiplanar CT image reconstructions and MIPs were obtained to evaluate the vascular anatomy. RADIATION DOSE REDUCTION: This exam was performed according to the departmental dose-optimization program which includes automated exposure control, adjustment of the mA and/or kV according to patient size and/or use of iterative reconstruction technique. CONTRAST:   75mL OMNIPAQUE IOHEXOL 350 MG/ML SOLN COMPARISON:  Chest x-ray  from earlier in the same day. FINDINGS: Cardiovascular: Atherosclerotic calcifications of the thoracic aorta are noted. No aneurysmal dilatation is seen. Cardiac shadow is enlarged. Coronary calcifications are noted. The pulmonary artery shows a normal branching pattern bilaterally. No filling defects to suggest pulmonary emboli are seen. Mediastinum/Nodes: Thoracic inlet is within normal limits. No hilar or mediastinal adenopathy is noted. The esophagus as visualized is within normal limits. Lungs/Pleura: Small bilateral pleural effusions right greater than left are seen. Mild bilateral atelectatic changes are noted. Upper Abdomen: Visualized upper abdomen demonstrates mild ascites. Kidneys are shrunken consistent with the known history of end-stage renal disease. Musculoskeletal: Degenerative changes of the thoracic spine are noted stable from the prior exam. Review of the MIP images confirms the above findings. IMPRESSION: No evidence of pulmonary emboli. Bilateral small effusions with associated atelectasis right greater than left. No other acute abnormality is noted. Aortic Atherosclerosis (ICD10-I70.0). Electronically Signed   By: Alcide Clever M.D.   On: 05/18/2023 23:56   US Abdomen Limited RUQ (LIVER/GB)  Result Date: 05/18/2023 CLINICAL DATA:  Elevated liver function tests EXAM: ULTRASOUND ABDOMEN LIMITED RIGHT UPPER QUADRANT COMPARISON:  11/15/2017 FINDINGS: Gallbladder: Prior cholecystectomy. Common bile duct: Diameter: 5 mm Liver: Diffuse increased liver echotexture consistent with hepatic steatosis. No focal liver abnormality. Portal vein is patent on color Doppler imaging with normal direction of blood flow towards the liver. Other: Incidental right pleural effusion. Trace right upper quadrant ascites. IMPRESSION: 1. Increased liver echotexture consistent with hepatic steatosis. 2. Prior cholecystectomy. 3. Trace right upper quadrant  ascites. 4. Incidental right pleural effusion. Electronically Signed   By: Sharlet Salina M.D.   On: 05/18/2023 23:11   DG Chest Portable 1 View  Result Date: 05/18/2023 CLINICAL DATA:  Hypotension, tachycardia, atrial fibrillation EXAM: PORTABLE CHEST 1 VIEW COMPARISON:  03/25/2023 FINDINGS: Two frontal views of the chest demonstrate an enlarged cardiac silhouette. Chronic central vascular prominence, without acute airspace disease, effusion, or pneumothorax. Linear consolidation at the left lung base likely reflects subsegmental atelectasis. No acute bony abnormalities. IMPRESSION: 1. Enlarged cardiac silhouette, with chronic central vascular congestion. No overt edema. Electronically Signed   By: Sharlet Salina M.D.   On: 05/18/2023 22:22    Microbiology: Recent Results (from the past 240 hour(s))  Culture, blood (x 2)     Status: None   Collection Time: 06/01/23  6:22 PM   Specimen: BLOOD  Result Value Ref Range Status   Specimen Description BLOOD BLOOD LEFT ARM  Final   Special Requests   Final    BOTTLES DRAWN AEROBIC AND ANAEROBIC Blood Culture adequate volume   Culture   Final    NO GROWTH 5 DAYS Performed at [facility], [address] [place], [place] [address]    Report Status 06/06/2023 FINAL  Final  Culture, blood (x 2)     Status: Abnormal   Collection Time: 06/01/23  6:22 PM   Specimen: BLOOD  Result Value Ref Range Status   Specimen Description   Final    BLOOD NECK Performed at [facility], [address] [place], [place] [address]    Special Requests   Final    BOTTLES DRAWN AEROBIC AND ANAEROBIC Blood Culture adequate volume Performed at [facility], [address] [place], [place] [address]    Culture  Setup Time   Final    GRAM POSITIVE COCCI IN BOTH AEROBIC AND ANAEROBIC BOTTLES CRITICAL RESULT CALLED TO, READ BACK BY AND VERIFIED WITH: ANDREA DOBBS ON 06/02/23 AT  1126 QSD    Culture (A)  Final    STAPHYLOCOCCUS  Robert Lucas STAPHYLOCOCCUS EPIDERMIDIS THE SIGNIFICANCE OF ISOLATING THIS ORGANISM FROM A SINGLE SET OF BLOOD CULTURES WHEN MULTIPLE SETS ARE DRAWN IS UNCERTAIN. PLEASE NOTIFY THE MICROBIOLOGY DEPARTMENT WITHIN ONE WEEK IF SPECIATION AND SENSITIVITIES ARE REQUIRED. Performed at [facility], [address], [place], [place] [address]    Report Status 06/18/2023 FINAL  Final   Organism ID, Bacteria STAPHYLOCOCCUS Robert Lucas  Final      Susceptibility   Staphylococcus Robert Lucas - MIC*    CIPROFLOXACIN <=0.5 SENSITIVE Sensitive     ERYTHROMYCIN INTERMEDIATE Intermediate     GENTAMICIN <=0.5 SENSITIVE Sensitive     OXACILLIN <=0.25 SENSITIVE Sensitive     TETRACYCLINE <=1 SENSITIVE Sensitive     VANCOMYCIN 1 SENSITIVE Sensitive     TRIMETH/SULFA <=10 SENSITIVE Sensitive     CLINDAMYCIN 4 RESISTANT Resistant     RIFAMPIN <=0.5 SENSITIVE Sensitive     Inducible Clindamycin NEGATIVE Sensitive     * STAPHYLOCOCCUS Robert Lucas  Blood Culture ID Panel (Reflexed)     Status: Abnormal   Collection Time: 06/01/23  6:22 PM  Result Value Ref Range Status   Enterococcus faecalis NOT DETECTED NOT DETECTED Final   Enterococcus Faecium NOT DETECTED NOT DETECTED Final   Listeria monocytogenes NOT DETECTED NOT DETECTED Final   Staphylococcus species DETECTED (A) NOT DETECTED Final    Comment: CRITICAL RESULT CALLED TO, READ BACK BY AND VERIFIED WITH: ANDREA DOBBS ON 06/02/23 AT 1126 QSD    Staphylococcus aureus (BCID) NOT DETECTED NOT DETECTED Final   Staphylococcus epidermidis NOT DETECTED NOT DETECTED Final   Staphylococcus lugdunensis NOT DETECTED NOT DETECTED Final   Streptococcus species NOT DETECTED NOT DETECTED Final   Streptococcus agalactiae NOT DETECTED NOT DETECTED Final   Streptococcus pneumoniae NOT DETECTED NOT DETECTED Final   Streptococcus pyogenes NOT DETECTED NOT DETECTED Final   A.calcoaceticus-baumannii NOT DETECTED NOT DETECTED Final   Bacteroides fragilis NOT DETECTED NOT DETECTED  Final   Enterobacterales NOT DETECTED NOT DETECTED Final   Enterobacter cloacae complex NOT DETECTED NOT DETECTED Final   Escherichia coli NOT DETECTED NOT DETECTED Final   Klebsiella aerogenes NOT DETECTED NOT DETECTED Final   Klebsiella oxytoca NOT DETECTED NOT DETECTED Final   Klebsiella pneumoniae NOT DETECTED NOT DETECTED Final   Proteus species NOT DETECTED NOT DETECTED Final   Salmonella species NOT DETECTED NOT DETECTED Final   Serratia marcescens NOT DETECTED NOT DETECTED Final   Haemophilus influenzae NOT DETECTED NOT DETECTED Final   Neisseria meningitidis NOT DETECTED NOT DETECTED Final   Pseudomonas aeruginosa NOT DETECTED NOT DETECTED Final   Stenotrophomonas maltophilia NOT DETECTED NOT DETECTED Final   Candida albicans NOT DETECTED NOT DETECTED Final   Candida auris NOT DETECTED NOT DETECTED Final   Candida glabrata NOT DETECTED NOT DETECTED Final   Candida krusei NOT DETECTED NOT DETECTED Final   Candida parapsilosis NOT DETECTED NOT DETECTED Final   Candida tropicalis NOT DETECTED NOT DETECTED Final   Cryptococcus neoformans/gattii NOT DETECTED NOT DETECTED Final    Comment: Performed at [facility], [address] [place], [place] [address]  Resp panel by RT-PCR (RSV, Flu A&B, Covid) Anterior Nasal Swab     Status: None   Collection Time: 06/01/23  7:14 PM   Specimen: Anterior Nasal Swab  Result Value Ref Range Status   SARS Coronavirus 2 by RT PCR NEGATIVE NEGATIVE Final    Comment: (NOTE) SARS-CoV-2 target nucleic acids are  NOT DETECTED.  The SARS-CoV-2 RNA is generally detectable in upper respiratory specimens during the acute phase of infection. The lowest concentration of SARS-CoV-2 viral copies this assay can detect is 138 copies/mL. A negative result does not preclude SARS-Cov-2 infection and should not be used as the sole basis for treatment or other patient management decisions. A negative result may occur with  improper specimen  collection/handling, submission of specimen other than nasopharyngeal swab, presence of viral mutation(s) within the areas targeted by this assay, and inadequate number of viral copies(<138 copies/mL). A negative result must be combined with clinical observations, patient history, and epidemiological information. The expected result is Negative.  Fact Sheet for Patients:  BloggerCourse.com  Fact Sheet for Healthcare Providers:  SeriousBroker.it  This test is no t yet approved or cleared by the [place] FDA and  has been authorized for detection and/or diagnosis of SARS-CoV-2 by FDA under an Emergency Use Authorization (EUA). This EUA will remain  in effect (meaning this test can be used) for the duration of the COVID-19 declaration under Section 564(b)(1) of the Act, 21 U.S.C.section 360bbb-3(b)(1), unless the authorization is terminated  or revoked sooner.       Influenza A by PCR NEGATIVE NEGATIVE Final   Influenza B by PCR NEGATIVE NEGATIVE Final    Comment: (NOTE) The Xpert Xpress SARS-CoV-2/FLU/RSV plus assay is intended as an aid in the diagnosis of influenza from Nasopharyngeal swab specimens and should not be used as a sole basis for treatment. Nasal washings and aspirates are unacceptable for Xpert Xpress SARS-CoV-2/FLU/RSV testing.  Fact Sheet for Patients: BloggerCourse.com  Fact Sheet for Healthcare Providers: SeriousBroker.it  This test is not yet approved or cleared by the [place] FDA and has been authorized for detection and/or diagnosis of SARS-CoV-2 by FDA under an Emergency Use Authorization (EUA). This EUA will remain in effect (meaning this test can be used) for the duration of the COVID-19 declaration under Section 564(b)(1) of the Act, 21 U.S.C. section 360bbb-3(b)(1), unless the authorization is terminated or revoked.     Resp Syncytial  Virus by PCR NEGATIVE NEGATIVE Final    Comment: (NOTE) Fact Sheet for Patients: BloggerCourse.com  Fact Sheet for Healthcare Providers: SeriousBroker.it  This test is not yet approved or cleared by the [place] FDA and has been authorized for detection and/or diagnosis of SARS-CoV-2 by FDA under an Emergency Use Authorization (EUA). This EUA will remain in effect (meaning this test can be used) for the duration of the COVID-19 declaration under Section 564(b)(1) of the Act, 21 U.S.C. section 360bbb-3(b)(1), unless the authorization is terminated or revoked.  Performed at [facility], [address] [place], [place] [address]   MRSA Next Gen by PCR, Nasal     Status: None   Collection Time: 06/02/23  2:11 AM   Specimen: Nasal Mucosa; Nasal Swab  Result Value Ref Range Status   MRSA by PCR Next Gen NOT DETECTED NOT DETECTED Final    Comment: (NOTE) The GeneXpert MRSA Assay (FDA approved for NASAL specimens only), is one component of a comprehensive MRSA colonization surveillance program. It is not intended to diagnose MRSA infection nor to guide or monitor treatment for MRSA infections. Test performance is not FDA approved in patients less than 67 years old. Performed at [facility], [address] [place], [place] [address]   Culture, blood (Routine X 2) w Reflex to ID Panel     Status: None (Preliminary result)   Collection Time: 06/02/23  1:13 PM   Specimen: BLOOD  Result Value Ref Range Status   Specimen Description BLOOD BLOOD RIGHT HAND  Final   Special Requests   Final    BOTTLES DRAWN AEROBIC ONLY Blood Culture adequate volume   Culture   Final    NO GROWTH 4 DAYS Performed at [facility], [address] [place], [place] [address]    Report Status PENDING  Incomplete  Culture, blood (Routine X 2) w Reflex to ID Panel     Status: None (Preliminary result)   Collection Time:  06/02/23  2:15 PM   Specimen: BLOOD  Result Value Ref Range Status   Specimen Description BLOOD PICC LINE  Final   Special Requests   Final    BOTTLES DRAWN AEROBIC AND ANAEROBIC Blood Culture adequate volume   Culture   Final    NO GROWTH 4 DAYS Performed at [facility], [address], , [place] [address]    Report Status PENDING  Incomplete  Aerobic Culture w Gram Stain (superficial specimen)     Status: None (Preliminary result)   Collection Time: 06/05/23  5:37 PM   Specimen: Wound  Result Value Ref Range Status   Specimen Description   Final    WOUND Performed at [facility], [address] [place], [place] [address]    Special Requests   Final    LEFT FOREARM Performed at [facility], [address] [place], [place] [address]    Gram Stain   Final    RARE WBC PRESENT, PREDOMINANTLY PMN NO ORGANISMS SEEN    Culture   Final    NO GROWTH 2 DAYS Performed at [facility], [address], [place], [place] [address]    Report Status PENDING  Incomplete    Time spent: 20 minutes  Signed: Jonah Blue, MD 06/30/2023

## 2023-07-04 DEATH — deceased
